# Patient Record
Sex: Female | Born: 1940 | Race: White | Hispanic: No | Smoking: Former smoker
Health system: Southern US, Community
[De-identification: ages and names within clinical notes are randomized; demographics above are authoritative.]

## PROBLEM LIST (undated history)

## (undated) ENCOUNTER — Emergency Department (HOSPITAL_COMMUNITY): Discharge status: Absent without leave | Payer: Medicare Other

## (undated) DIAGNOSIS — T8859XA Other complications of anesthesia, initial encounter: Secondary | ICD-10-CM

## (undated) DIAGNOSIS — I639 Cerebral infarction, unspecified: Secondary | ICD-10-CM

## (undated) DIAGNOSIS — M545 Low back pain, unspecified: Secondary | ICD-10-CM

## (undated) DIAGNOSIS — G971 Other reaction to spinal and lumbar puncture: Secondary | ICD-10-CM

## (undated) DIAGNOSIS — Z8709 Personal history of other diseases of the respiratory system: Secondary | ICD-10-CM

## (undated) DIAGNOSIS — R0602 Shortness of breath: Secondary | ICD-10-CM

## (undated) DIAGNOSIS — J189 Pneumonia, unspecified organism: Secondary | ICD-10-CM

## (undated) DIAGNOSIS — G8929 Other chronic pain: Secondary | ICD-10-CM

## (undated) DIAGNOSIS — R2681 Unsteadiness on feet: Secondary | ICD-10-CM

## (undated) DIAGNOSIS — D649 Anemia, unspecified: Secondary | ICD-10-CM

## (undated) DIAGNOSIS — Z87448 Personal history of other diseases of urinary system: Secondary | ICD-10-CM

## (undated) DIAGNOSIS — K219 Gastro-esophageal reflux disease without esophagitis: Secondary | ICD-10-CM

## (undated) DIAGNOSIS — G894 Chronic pain syndrome: Secondary | ICD-10-CM

## (undated) DIAGNOSIS — R3915 Urgency of urination: Secondary | ICD-10-CM

## (undated) DIAGNOSIS — M542 Cervicalgia: Secondary | ICD-10-CM

## (undated) DIAGNOSIS — F32A Depression, unspecified: Secondary | ICD-10-CM

## (undated) DIAGNOSIS — K5900 Constipation, unspecified: Secondary | ICD-10-CM

## (undated) DIAGNOSIS — T4145XA Adverse effect of unspecified anesthetic, initial encounter: Secondary | ICD-10-CM

## (undated) DIAGNOSIS — E039 Hypothyroidism, unspecified: Secondary | ICD-10-CM

## (undated) DIAGNOSIS — M1990 Unspecified osteoarthritis, unspecified site: Secondary | ICD-10-CM

## (undated) DIAGNOSIS — Z8619 Personal history of other infectious and parasitic diseases: Secondary | ICD-10-CM

## (undated) DIAGNOSIS — I10 Essential (primary) hypertension: Secondary | ICD-10-CM

## (undated) DIAGNOSIS — R011 Cardiac murmur, unspecified: Secondary | ICD-10-CM

## (undated) DIAGNOSIS — M2540 Effusion, unspecified joint: Secondary | ICD-10-CM

## (undated) DIAGNOSIS — M4690 Unspecified inflammatory spondylopathy, site unspecified: Secondary | ICD-10-CM

## (undated) DIAGNOSIS — Z87442 Personal history of urinary calculi: Secondary | ICD-10-CM

## (undated) HISTORY — PX: SHOULDER ARTHROSCOPY W/ ROTATOR CUFF REPAIR: SHX2400

## (undated) HISTORY — PX: BUNIONECTOMY: SHX129

## (undated) HISTORY — PX: COLONOSCOPY: SHX174

## (undated) HISTORY — PX: OTHER SURGICAL HISTORY: SHX169

## (undated) HISTORY — PX: ANTERIOR FUSION CERVICAL SPINE: SUR626

## (undated) HISTORY — PX: NASAL SINUS SURGERY: SHX719

## (undated) HISTORY — PX: THUMB ARTHROSCOPY: SHX2509

## (undated) HISTORY — PX: EYE SURGERY: SHX253

## (undated) HISTORY — PX: ABDOMINAL HYSTERECTOMY: SHX81

---

## 1989-11-29 DIAGNOSIS — G971 Other reaction to spinal and lumbar puncture: Secondary | ICD-10-CM

## 1989-11-29 HISTORY — DX: Other reaction to spinal and lumbar puncture: G97.1

## 1999-03-03 ENCOUNTER — Encounter: Payer: Self-pay | Admitting: Neurological Surgery

## 1999-03-05 ENCOUNTER — Inpatient Hospital Stay (HOSPITAL_COMMUNITY): Admission: RE | Admit: 1999-03-05 | Discharge: 1999-03-06 | Payer: Self-pay | Admitting: Neurological Surgery

## 1999-03-05 ENCOUNTER — Encounter: Payer: Self-pay | Admitting: Neurological Surgery

## 1999-08-06 ENCOUNTER — Ambulatory Visit (HOSPITAL_COMMUNITY): Admission: RE | Admit: 1999-08-06 | Discharge: 1999-08-06 | Payer: Self-pay | Admitting: Gastroenterology

## 1999-08-07 ENCOUNTER — Encounter: Payer: Self-pay | Admitting: Gastroenterology

## 1999-08-07 ENCOUNTER — Ambulatory Visit (HOSPITAL_COMMUNITY): Admission: RE | Admit: 1999-08-07 | Discharge: 1999-08-07 | Payer: Self-pay | Admitting: Gastroenterology

## 1999-08-11 ENCOUNTER — Ambulatory Visit (HOSPITAL_COMMUNITY): Admission: RE | Admit: 1999-08-11 | Discharge: 1999-08-11 | Payer: Self-pay | Admitting: Gastroenterology

## 1999-08-11 ENCOUNTER — Encounter: Payer: Self-pay | Admitting: Gastroenterology

## 2000-01-06 ENCOUNTER — Ambulatory Visit (HOSPITAL_COMMUNITY): Admission: RE | Admit: 2000-01-06 | Discharge: 2000-01-06 | Payer: Self-pay | Admitting: Gastroenterology

## 2000-01-06 ENCOUNTER — Encounter: Payer: Self-pay | Admitting: Gastroenterology

## 2000-01-08 ENCOUNTER — Encounter: Admission: RE | Admit: 2000-01-08 | Discharge: 2000-01-08 | Payer: Self-pay | Admitting: Rheumatology

## 2000-01-08 ENCOUNTER — Encounter: Payer: Self-pay | Admitting: Rheumatology

## 2000-03-01 ENCOUNTER — Other Ambulatory Visit: Admission: RE | Admit: 2000-03-01 | Discharge: 2000-03-01 | Payer: Self-pay | Admitting: Podiatry

## 2000-05-19 ENCOUNTER — Encounter (INDEPENDENT_AMBULATORY_CARE_PROVIDER_SITE_OTHER): Payer: Self-pay | Admitting: Specialist

## 2000-05-19 ENCOUNTER — Observation Stay (HOSPITAL_COMMUNITY): Admission: RE | Admit: 2000-05-19 | Discharge: 2000-05-20 | Payer: Self-pay | Admitting: Surgery

## 2001-03-06 ENCOUNTER — Encounter: Payer: Self-pay | Admitting: Specialist

## 2001-03-06 ENCOUNTER — Ambulatory Visit (HOSPITAL_COMMUNITY): Admission: RE | Admit: 2001-03-06 | Discharge: 2001-03-06 | Payer: Self-pay | Admitting: Specialist

## 2001-08-19 ENCOUNTER — Encounter
Admission: RE | Admit: 2001-08-19 | Discharge: 2001-08-19 | Payer: Self-pay | Admitting: Physical Medicine and Rehabilitation

## 2001-08-19 ENCOUNTER — Encounter: Payer: Self-pay | Admitting: Physical Medicine and Rehabilitation

## 2002-01-11 ENCOUNTER — Encounter: Admission: RE | Admit: 2002-01-11 | Discharge: 2002-01-11 | Payer: Self-pay | Admitting: Internal Medicine

## 2002-01-11 ENCOUNTER — Encounter: Payer: Self-pay | Admitting: Internal Medicine

## 2002-08-14 ENCOUNTER — Other Ambulatory Visit: Admission: RE | Admit: 2002-08-14 | Discharge: 2002-08-14 | Payer: Self-pay | Admitting: Obstetrics & Gynecology

## 2003-08-28 ENCOUNTER — Encounter: Admission: RE | Admit: 2003-08-28 | Discharge: 2003-08-28 | Payer: Self-pay | Admitting: Internal Medicine

## 2003-08-28 ENCOUNTER — Encounter: Payer: Self-pay | Admitting: Internal Medicine

## 2004-03-17 ENCOUNTER — Encounter: Admission: RE | Admit: 2004-03-17 | Discharge: 2004-03-17 | Payer: Self-pay | Admitting: Internal Medicine

## 2004-12-22 ENCOUNTER — Ambulatory Visit (HOSPITAL_BASED_OUTPATIENT_CLINIC_OR_DEPARTMENT_OTHER): Admission: RE | Admit: 2004-12-22 | Discharge: 2004-12-22 | Payer: Self-pay | Admitting: Orthopedic Surgery

## 2004-12-22 ENCOUNTER — Ambulatory Visit (HOSPITAL_COMMUNITY): Admission: RE | Admit: 2004-12-22 | Discharge: 2004-12-22 | Payer: Self-pay | Admitting: Orthopedic Surgery

## 2006-09-30 ENCOUNTER — Encounter: Admission: RE | Admit: 2006-09-30 | Discharge: 2006-09-30 | Payer: Self-pay | Admitting: Orthopedic Surgery

## 2006-11-28 ENCOUNTER — Ambulatory Visit (HOSPITAL_BASED_OUTPATIENT_CLINIC_OR_DEPARTMENT_OTHER): Admission: RE | Admit: 2006-11-28 | Discharge: 2006-11-28 | Payer: Self-pay | Admitting: Orthopedic Surgery

## 2007-09-14 ENCOUNTER — Ambulatory Visit (HOSPITAL_COMMUNITY): Admission: RE | Admit: 2007-09-14 | Discharge: 2007-09-14 | Payer: Self-pay | Admitting: Internal Medicine

## 2008-06-26 ENCOUNTER — Ambulatory Visit (HOSPITAL_COMMUNITY): Admission: RE | Admit: 2008-06-26 | Discharge: 2008-06-26 | Payer: Self-pay | Admitting: Internal Medicine

## 2008-08-13 ENCOUNTER — Ambulatory Visit (HOSPITAL_COMMUNITY): Admission: RE | Admit: 2008-08-13 | Discharge: 2008-08-13 | Payer: Self-pay | Admitting: Anesthesiology

## 2008-10-02 ENCOUNTER — Ambulatory Visit (HOSPITAL_COMMUNITY): Admission: RE | Admit: 2008-10-02 | Discharge: 2008-10-02 | Payer: Self-pay | Admitting: Internal Medicine

## 2008-10-14 ENCOUNTER — Ambulatory Visit: Payer: Self-pay

## 2008-11-04 ENCOUNTER — Ambulatory Visit (HOSPITAL_COMMUNITY): Admission: RE | Admit: 2008-11-04 | Discharge: 2008-11-05 | Payer: Self-pay | Admitting: Neurological Surgery

## 2009-09-11 ENCOUNTER — Ambulatory Visit (HOSPITAL_COMMUNITY): Admission: RE | Admit: 2009-09-11 | Discharge: 2009-09-11 | Payer: Self-pay | Admitting: Ophthalmology

## 2009-12-25 ENCOUNTER — Emergency Department (HOSPITAL_COMMUNITY): Admission: EM | Admit: 2009-12-25 | Discharge: 2009-12-26 | Payer: Self-pay | Admitting: Emergency Medicine

## 2009-12-26 ENCOUNTER — Ambulatory Visit (HOSPITAL_COMMUNITY): Admission: RE | Admit: 2009-12-26 | Discharge: 2009-12-26 | Payer: Self-pay | Admitting: Urology

## 2009-12-30 DEATH — deceased

## 2010-09-01 ENCOUNTER — Ambulatory Visit: Payer: Self-pay | Admitting: Internal Medicine

## 2010-09-09 LAB — COMPREHENSIVE METABOLIC PANEL
ALT: 27 U/L (ref 0–35)
Albumin: 3.8 g/dL (ref 3.5–5.2)
Alkaline Phosphatase: 94 U/L (ref 39–117)
CO2: 26 mEq/L (ref 19–32)
Creatinine, Ser: 0.73 mg/dL (ref 0.40–1.20)
Potassium: 4.6 mEq/L (ref 3.5–5.3)
Sodium: 139 mEq/L (ref 135–145)
Total Bilirubin: 0.4 mg/dL (ref 0.3–1.2)
Total Protein: 5.8 g/dL — ABNORMAL LOW (ref 6.0–8.3)

## 2010-09-09 LAB — CBC & DIFF AND RETIC
LYMPH%: 24.1 % (ref 14.0–49.7)
MCV: 79.8 fL (ref 79.5–101.0)
MONO#: 0.5 10*3/uL (ref 0.1–0.9)
NEUT%: 54.2 % (ref 38.4–76.8)
RDW: 14.8 % — ABNORMAL HIGH (ref 11.2–14.5)
WBC: 6.6 10*3/uL (ref 3.9–10.3)

## 2010-09-09 LAB — FOLATE: Folate: >20 ng/mL

## 2010-09-09 LAB — LACTATE DEHYDROGENASE: LDH: 185 U/L (ref 94–250)

## 2010-09-09 LAB — FERRITIN: Ferritin: 6 ng/mL — ABNORMAL LOW (ref 10–291)

## 2010-09-09 LAB — IRON AND TIBC
Iron: 35 ug/dL — ABNORMAL LOW (ref 42–145)
UIBC: 365 ug/dL

## 2010-09-11 LAB — PROTEIN ELECTROPHORESIS, SERUM
Albumin ELP: 60.1 % (ref 55.8–66.1)
Alpha-1-Globulin: 11 % — ABNORMAL HIGH (ref 2.9–4.9)
Beta 2: 2.2 % — ABNORMAL LOW (ref 3.2–6.5)

## 2010-09-14 ENCOUNTER — Ambulatory Visit (HOSPITAL_COMMUNITY): Admission: RE | Admit: 2010-09-14 | Discharge: 2010-09-14 | Payer: Self-pay | Admitting: Internal Medicine

## 2010-09-24 LAB — CBC WITH DIFFERENTIAL/PLATELET
Basophils Absolute: 0.1 10*3/uL (ref 0.0–0.1)
Eosinophils Absolute: 1.3 10*3/uL — ABNORMAL HIGH (ref 0.0–0.5)
HCT: 36.8 % (ref 34.8–46.6)
HGB: 11.5 g/dL — ABNORMAL LOW (ref 11.6–15.9)
MCH: 25.5 pg (ref 25.1–34.0)
MCHC: 31.3 g/dL — ABNORMAL LOW (ref 31.5–36.0)
lymph#: 2.2 10*3/uL (ref 0.9–3.3)

## 2010-10-01 ENCOUNTER — Ambulatory Visit: Payer: Self-pay | Admitting: Internal Medicine

## 2010-10-29 ENCOUNTER — Ambulatory Visit (HOSPITAL_COMMUNITY)
Admission: RE | Admit: 2010-10-29 | Discharge: 2010-10-29 | Payer: Self-pay | Source: Home / Self Care | Admitting: Gastroenterology

## 2010-11-20 ENCOUNTER — Ambulatory Visit: Payer: Self-pay | Admitting: Internal Medicine

## 2010-11-25 LAB — IRON AND TIBC
Iron: 98 ug/dL (ref 42–145)
TIBC: 257 ug/dL (ref 250–470)
UIBC: 159 ug/dL

## 2010-11-25 LAB — CBC WITH DIFFERENTIAL/PLATELET
BASO%: 1.2 % (ref 0.0–2.0)
Basophils Absolute: 0.1 10*3/uL (ref 0.0–0.1)
EOS%: 19.5 % — ABNORMAL HIGH (ref 0.0–7.0)
HGB: 12.4 g/dL (ref 11.6–15.9)
LYMPH%: 31 % (ref 14.0–49.7)
MCHC: 33.5 g/dL (ref 31.5–36.0)
MCV: 85.3 fL (ref 79.5–101.0)
MONO%: 8.9 % (ref 0.0–14.0)
NEUT#: 2 10*3/uL (ref 1.5–6.5)
RBC: 4.33 10*6/uL (ref 3.70–5.45)
RDW: 18.8 % — ABNORMAL HIGH (ref 11.2–14.5)

## 2010-11-25 LAB — FERRITIN: Ferritin: 183 ng/mL (ref 10–291)

## 2010-11-26 ENCOUNTER — Encounter
Admission: RE | Admit: 2010-11-26 | Discharge: 2010-11-26 | Payer: Self-pay | Source: Home / Self Care | Attending: Internal Medicine | Admitting: Internal Medicine

## 2010-11-29 HISTORY — PX: CYSTOSCOPY W/ URETEROSCOPY: SUR379

## 2010-11-29 HISTORY — PX: LAPAROSCOPIC CHOLECYSTECTOMY W/ CHOLANGIOGRAPHY: SUR757

## 2010-11-29 HISTORY — PX: JOINT REPLACEMENT: SHX530

## 2010-12-07 ENCOUNTER — Ambulatory Visit (HOSPITAL_COMMUNITY)
Admission: RE | Admit: 2010-12-07 | Discharge: 2010-12-07 | Payer: Self-pay | Source: Home / Self Care | Attending: Gastroenterology | Admitting: Gastroenterology

## 2010-12-25 ENCOUNTER — Encounter
Admission: RE | Admit: 2010-12-25 | Discharge: 2010-12-25 | Payer: Self-pay | Source: Home / Self Care | Attending: Gastroenterology | Admitting: Gastroenterology

## 2011-02-14 LAB — URINE MICROSCOPIC-ADD ON

## 2011-02-14 LAB — CBC
HCT: 37 % (ref 36.0–46.0)
MCHC: 34.1 g/dL (ref 30.0–36.0)
MCV: 88 fL (ref 78.0–100.0)
Platelets: 263 10*3/uL (ref 150–400)
RBC: 4.21 MIL/uL (ref 3.87–5.11)
RDW: 14 % (ref 11.5–15.5)

## 2011-02-14 LAB — BASIC METABOLIC PANEL
GFR calc Af Amer: >60 mL/min (ref >60)
Glucose, Bld: 127 mg/dL — ABNORMAL HIGH (ref 70–99)
Potassium: 3.7 mEq/L (ref 3.5–5.1)

## 2011-02-14 LAB — URINALYSIS, ROUTINE W REFLEX MICROSCOPIC
Protein, ur: NEGATIVE mg/dL
Specific Gravity, Urine: 1.01 (ref 1.005–1.030)

## 2011-02-14 LAB — DIFFERENTIAL
Eosinophils Relative: 5 % (ref 0–5)
Lymphocytes Relative: 13 % (ref 12–46)
Monocytes Absolute: 0.8 10*3/uL (ref 0.1–1.0)
Monocytes Relative: 7 % (ref 3–12)

## 2011-03-04 LAB — CBC
Hemoglobin: 12.7 g/dL (ref 12.0–15.0)
Platelets: 253 10*3/uL (ref 150–400)
RDW: 14.8 % (ref 11.5–15.5)

## 2011-04-13 NOTE — Op Note (Signed)
NAME:  Isabel Barnes, Isabel Barnes             ACCOUNT NO.:  [id number]   MEDICAL RECORD NO.:  [id number]          PATIENT TYPE:  INP   LOCATION:  3524                         FACILITY:  [facility]   PHYSICIAN:  Stefani Dama, M.D.  DATE OF BIRTH:  06/24/1941   DATE OF PROCEDURE:  11/04/2008  DATE OF DISCHARGE:                               OPERATIVE REPORT   PREOPERATIVE DIAGNOSIS:  Spondylosis C6-C7 with cervical radiculopathy  bilaterally.   POSTOPERATIVE DIAGNOSIS:  Spondylosis C6-C7 with cervical radiculopathy  bilaterally.   PROCEDURE:  Anterior cervical decompression C6-C7 arthrodesis with  structural allograft Alphatec plate fixation.   SURGEON:  Stefani Dama, MD   FIRST ASSISTANT:  Hewitt Shorts, MD   ANESTHESIA:  General endotracheal.   INDICATIONS:  Isabel Barnes is a 70 year old individual who has had  significant problems with neck, shoulder and arm pain bilaterally.  She  has had previous rotator cuff repair but she has evidence of  radiculopathic pain in the C7 distribution bilaterally.  She has had a  previous anterior decompression arthrodesis at C3-C4 secondary to cord  compression at that level and now has advanced spondylitic changes at C6-  C7.   PROCEDURE:  The patient was brought to the operating room and placed on  the table in supine position.  After the smooth induction of general  endotracheal anesthesia, neck was braced in a 5-pound halter traction,  prepped with alcohol and Chlorascrub and then draped sterilely.  Transverse incision was made in the left side of the neck and dissection  was carried down through the platysma.  The plane between the  sternocleidomastoid and the strap muscles were dissected bluntly until  the prevertebral space was reached.  The first identifiable disk space  was noted to be that of C5-C6 on localizing radiograph.  The patient  then had the anterior dissection carried down to C6-C7 and self-  retaining Caspar retractor  was placed under longus colli muscle.  The  disk space was opened with #15 blade and a combination of Kerrison  rongeurs was used to evacuate a significant quantity of severely  degenerated and desiccated disk material from the space.  As the region  of the posterior longitudinal ligament was reached, there was noted to  be a substantial osteophyte mostly from the inferior body of the C6 up  laterally into the uncinate processes and this was drilled down with a  2.3 mm dissecting tool and then a 1 mm and then a 2 mm Kerrison punch  could be slipped under this to decompress both the central portion of  canal and lateral recesses.  With the uncinate processes being drilled  down and the lateral recesses being well-decompressed, the bony  endplates were then shaved with a 5-mm barrel bit to align them  parallel.  Then a 7-mm transgraft was shaved to the appropriate size and  configuration to fit within the interspace.  This was filled with  demineralized bone matrix then placed into the interspace and  countersunk appropriately.  At this point, traction was removed and then  a 14-mm standard size Trestle plate was  fixed to the ventral aspect of  the vertebral bodies with four variable angle 14-mm screws.  Final  radiograph was obtained identifying good position of the surgical  construct.  Hemostasis in the soft tissues was then meticulously  obtained, particularly from the region of the longus colli muscle and  self-retaining retractor was removed.  Hemostasis in the soft tissues was checked and then the platysma was  closed with 3-0 Vicryl in interrupted fashion, 3-0 Vicryl was used in  the subcuticular tissues and Dermabond was placed on the skin.  The  patient tolerated the procedure well.      Stefani Dama, M.D.  Electronically Signed     HJE/MEDQ  D:  11/04/2008  T:  11/05/2008  Job:  [id number]

## 2011-04-16 NOTE — Op Note (Signed)
NAME:  Isabel Barnes, Isabel Barnes             ACCOUNT NO.:  [id number]   MEDICAL RECORD NO.:  [id number]          PATIENT TYPE:  AMB   LOCATION:  DSC                          FACILITY:  [facility]   PHYSICIAN:  Dionne Ano. 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   PROCEDURES:  1.  Removal of cystic mass left first dorsal compartment, consistent with a      ganglion.  2.  Arthroplasty left CMC joint, with trapezium excision.  3.  Tendon transfer in the form of a one-third abductor pollicis longus      proper portion tendon transfer to the FCR, APL proper, and back upon      itself (Weilby tendon transfer/suspension) left basilar thumb joint.  4.  Abductor pollicis longus digastric portion tendon transfer to the first      metacarpal, FCR, and back upon itself and the APL proper (Zancolli      tendon transfer/suspension) left basilar thumb joint.  5.  Abductor pollicis longus tenodesis (shortening of a wrist extensor at      the wrist/basilar thumb joint level).   SURGEON:  Dionne Ano. Amanda Pea, M.D.   ASSISTANT:  Karie Chimera, P.A.-C.   COMPLICATIONS:  None.   ANESTHESIA:  Preoperative regional block, with supplemental LMA general  anesthetic.   TOURNIQUET TIME:  59 minutes.   ESTIMATED BLOOD LOSS:  Minimal.   INDICATIONS FOR THE PROCEDURE:  This patient is a very pleasant female who  presents with the above-mentioned diagnosis.  After counseling regarding the  risks and benefits of surgery, including the risk of infection, bleeding,  anesthesia, damage to normal structures, and  failure of the surgery to  accomplish its intended goals of relieving symptoms and restoring function,  With this in mind, she desires to proceed.  All questions have been  encouraged and answered preoperatively.   OPERATIVE FINDINGS:  This patient had a cystic mass about the first dorsal  compartment.  It was removed, along with decompression of the compartment.  She also underwent double tendon transfer and arthroplasty of the [facility] joint  without complicating features.  All sponge, needle, and instrument counts  were reported as correct.  There were no complicating features with her  surgery.  She was excellently suspended, and there were no complicated  features with her surgery.   OPERATION IN DETAIL:  The patient was seen by myself and anesthesia and was  taken to the operative suite.  Axillary block was administered by Dr.  Jacklynn Bue in the preoperative holding area.  She was given vancomycin 500 mg  IV.  Following this, she was laid supine and appropriately padded, prepped  and draped in the usual sterile fashion about the left upper extremity after  general LMA anesthetic  was induced.  The preoperative block of course was  for postop pain analgesia.  Following this, the patient then underwent an  incision dorsally about the basilar thumb joint.  Dissection was carried  down.  The EPB and APL were identified.  An incision was made along the  interval between the two.  The capsule was incised.  Freer elevator was then  placed on either side of the trapezium, and the trapezium was excised  piecemeal.  I identified the radial artery and the superficial radial nerve  branches to make sure that they were protected throughout the course of the  operation.  They were meticulously protected.  The trapezium was removed.  The FCR underwent a tenolysis and tenosynovectomy in the depths of the  wound, and following this a drill hole was made dorsal to palmar, extending  intra-articularly in  line with the palmar beak ligament and enlarged to a  3.2 drill bit under guide wire protection and placement.  The joint was then  cleaned out, and this completed the arthroplasty portion of the procedure.   Once this was done, the patient had a counterincision made proximally, and  the abductor pollicis longus digastric portion and the abductor pollicis  longus one-third proper portion were harvested.  They were released  proximally and allowed to keep their secure anchor distally.  Following  this, I then removed the cystic mass off of the first dorsal compartment  consistent with a ganglion.  This was a ganglion off of the first dorsal  compartment, and a partial release of the first dorsal compartment was  accomplished as was decompression via the tendon release.  Once this was  done, the abductor pollicis longus digastric portion was transferred  adjacent to the metacarpal, through the drill hole, dorsal to palmar,  extending intra-articularly in line with the palmar beak ligament through  the previously drilled hole.  I then placed it around the FCR twice and  through a slit in it and back up to the APL proper.  It was inset with fiber  wire suture of the 4-0 variety under excellent tension.  This completed the  Zancolli tendon transfer.  Following this, I performed tendon transfer of  the APL one-third proper portion to the APL proper and back upon the FCR and  itself with multiple figure-of-eight throws around these structures,  insetting it/securing it with 4-0 fiber wire.  This completed the [facility]  tendon transfer.  Following this, I performed an abductor pollicis longus  tenodesis to prevent dorsolateral escape.  This was done by shortening the  abductor pollicis longus with fiber wire suture.  This was imbricated with  the capsule as well during the closure.  Thus, the shortening of her wrist  extensor was accomplished also.  Following this, the patient then  underwent irrigation, tourniquet deflation at 59 minutes.  Excellent hemostasis was  noted, and all looked quite well.  I was pleased with this and the findings.  Following this, the patient then underwent dressing after closure and thumb  spica splint.  She tolerated this well.  There were no complicating  features.  She will be admitted for IV antibiotics, pain medicine according  to her needs, and will be monitored closely.  It has been a pleasure to  participate in her care.  I look forward to participating in her postop  recovery.      WMG/MEDQ  D:  12/22/2004  T:  12/22/2004  Job:  [id number]  cc:   Dionne Ano. Everlene Other, M.D.  [address]  [place]  [place] [address]  Fax: [phone]

## 2011-04-16 NOTE — Op Note (Signed)
NAME:  Isabel Barnes, Isabel Barnes             ACCOUNT NO.:  [id number]   MEDICAL RECORD NO.:  [id number]          PATIENT TYPE:  AMB   LOCATION:  DSC                          FACILITY:  [facility]   PHYSICIAN:  Robert A. Thurston Hole, M.D. DATE OF BIRTH:  08/13/1941   DATE OF PROCEDURE:  11/28/2006  DATE OF DISCHARGE:                               OPERATIVE REPORT   PREOPERATIVE DIAGNOSES:  1. Right shoulder partial rotator cuff tear partial labrum tear.  2. Right shoulder chondromalacia.  3. Right shoulder impingement.  4. 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   POSTOPERATIVE DIAGNOSES:  1. Right shoulder partial rotator cuff tear partial labrum tear.  2. Right shoulder chondromalacia.  3. Right shoulder impingement.  4. 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   PROCEDURE:  1. Right shoulder EUA followed by arthroscopic debridement partial      rotator cuff tear, partial labrum tear.  2. Right shoulder chondroplasty.  3. Right shoulder subacromial decompression.  4. Right shoulder distal clavicle excision.   SURGEON:  Elana Alm. Thurston Hole, M.D.   ASSISTANT:  Julien Girt, P.A.   ANESTHESIA:  General.   OPERATIVE TIME:  45 minutes.   COMPLICATIONS:  None.   INDICATIONS FOR PROCEDURE:  Isabel Barnes is a 65-year woman who has had 2  years of increasing right shoulder pain with exam and MRI documenting a  partial versus complete rotator cuff tear.  She had a rotator cuff  repair 15 years ago.  Also impingement and AC joint arthropathy.  She  has failed conservative care is now to undergo arthroscopy.   DESCRIPTION:  Isabel Barnes is brought to operating room on 11/28/2006  after interscalene block was placed in holding room by anesthesia.  She  was placed operative table supine position.  After being placed under  general anesthesia her right shoulder was examined.  She had full range  of motion.   Her shoulder was stable to ligamentous exam.  She was then  placed in beach chair position.  Her shoulder and arm was prepped with  Hibiclens and draped using sterile technique.  Originally through a  posterior arthroscopic portal, the arthroscope with a pump attached was  placed and through an anterior portal, an arthroscopic probe was placed.  On initial inspection, the articular cartilage in the glenohumeral joint  showed 25 to 30% grade 3 chondromalacia which was debrided.  She had  part partial tearing of the anterior, superior and posterior labrum 25  to 30% which was debrided.  Biceps tendon anchor was intact.  Biceps  tendon showed partial tearing 30% which was debrided with partial medial  subluxation of the biceps.  She had partial tearing 50 to 60% of the  undersurface of the supraspinatus, infraspinatus and subscap and this  was partially debrided but it was still well attached and her  previous  sutures from her previous repair were still intact.  The inferior  capsular recess was free of pathology.  Subacromial space was entered.  A lateral arthroscopic portal was made.  Moderate bursitis was were  debrided.  The rotator cuff on the bursal surface was frayed and  partially torn 30 to 40% and this was carefully debrided but was still  intact.  Impingement was noted and very careful subacromial  decompression was carried out removing 4 to 5 mm of the undersurface of  the anterior, anterolateral, anteromedial acromion.  [facility] joint showed  significant spurring and degenerative changes and the distal 5 mm of  clavicle was resected with a 6-mm bur.  After this was done, the  shoulder could be brought through full range of motion with no  impingement on the rotator cuff.  At this point it was felt that all  pathology had been satisfactorily addressed.  The instruments were  removed.  Portals closed with 3-0 nylon suture.  Sterile dressings and a  sling applied.  The patient  awakened and taken to recovery in stable  condition.   FOLLOW-UP CARE:  Isabel Barnes will be followed as outpatient on Percocet  7.5 with early physical therapy.  See her back in the office in a week  for sutures out follow-up.      Robert A. Thurston Hole, M.D.  Electronically Signed     RAW/MEDQ  D:  11/28/2006  T:  11/28/2006  Job:  [id number]

## 2011-04-16 NOTE — Op Note (Signed)
[facility]  Patient:    Isabel Barnes, Isabel Barnes                      MRN: [id number] Proc. Date: 05/19/00 Adm. Date:  [id number] Disc. Date: [id number] Attending:  Abigail Miyamoto A                           Operative Report  PREOPERATIVE DIAGNOSIS:  Biliary dyskinesia.  POSTOPERATIVE DIAGNOSIS:  Biliary dyskinesia.  PROCEDURE:  Laparoscopic cholecystectomy.  SURGEON:  Abigail Miyamoto, M.D.  ASSISTANT:  Gita Kudo, M.D.  ANESTHESIA:  General endotracheal anesthesia and 0.25% Marcaine plain.  DESCRIPTION OF PROCEDURE:  The patient was brought to the operating room, identified as Nunzio Cory. She was placed supine on the operating room table and general anesthesia was induced. Her abdomen was then prepped and draped in the usual sterile fashion. Using a #15 blade, a small transverse incision was made just below the umbilicus through a previous incision. The incision was carried down through the fascia. The fascia was then opened with a scalpel. A hemostat was then used to pass into the peritoneal cavity. Next a #0 Vicryl pursestring suture was placed around the opening. The Hasson port was then placed into the opening and insufflation of the abdomen was begun. Next, an 11 mm port was placed in the patients epigastrium and two 5 mm ports were placed in the patients right flank all under direct vision. The gallbladder was then identified and retracted above the liver bed. Dissection was then carried out at the base of the gallbladder. The cystic duct and artery were easily dissected out. The duct was clipped twice proximally, once distally and transected with the scissors. The artery was likewise clipped twice proximally, once distally and transected as well. Another branch was identified and clipped as well. The gallbladder was then slowly dissected free from the liver bed with the electrocautery. Hemostasis in the liver bed appeared to be achieved.  The gallbladder was then completely removed from the liver bed and then removed through the port at the umbilicus. Next, the abdomen was copiously irrigated with normal saline. All ports were then removed under direct vision. The abdomen was then deflated. The #0 Vicryl of the umbilicus was then fastened in place. All skin incisions were then anesthetized with 0.25% Marcaine and then closed with 4-0 monocryl subcuticular sutures. Steri-Strips, gauze and tape were then applied. The patient tolerated the procedure well. All sponge, needle and instrument counts were correct at the end of the procedure. The patient was then extubated in the operating room and taken in stable condition to the recovery room. DD:  05/19/00 TD:  05/20/00 Job: [id number]

## 2011-07-14 ENCOUNTER — Other Ambulatory Visit: Payer: Self-pay | Admitting: Orthopedic Surgery

## 2011-07-14 DIAGNOSIS — M129 Arthropathy, unspecified: Secondary | ICD-10-CM

## 2011-07-16 ENCOUNTER — Ambulatory Visit
Admission: RE | Admit: 2011-07-16 | Discharge: 2011-07-16 | Disposition: A | Discharge status: Home / Self Care | Payer: Medicare Other | Source: Ambulatory Visit | Attending: Orthopedic Surgery | Admitting: Orthopedic Surgery

## 2011-07-16 DIAGNOSIS — M129 Arthropathy, unspecified: Secondary | ICD-10-CM

## 2011-07-21 ENCOUNTER — Encounter (HOSPITAL_COMMUNITY)
Admission: RE | Admit: 2011-07-21 | Discharge: 2011-07-21 | Disposition: A | Discharge status: Home / Self Care | Payer: Managed Care, Other (non HMO) | Source: Ambulatory Visit | Attending: Orthopedic Surgery | Admitting: Orthopedic Surgery

## 2011-07-21 LAB — BASIC METABOLIC PANEL
BUN: 14 mg/dL (ref 6–23)
Chloride: 107 mEq/L (ref 96–112)
GFR calc Af Amer: >60 mL/min (ref >60)
Glucose, Bld: 92 mg/dL (ref 70–99)
Potassium: 4.8 mEq/L (ref 3.5–5.1)

## 2011-07-21 LAB — CBC
HCT: 37.1 % (ref 36.0–46.0)
Hemoglobin: 12.4 g/dL (ref 12.0–15.0)
RBC: 4.33 MIL/uL (ref 3.87–5.11)
WBC: 7.4 10*3/uL (ref 4.0–10.5)

## 2011-07-27 ENCOUNTER — Inpatient Hospital Stay (HOSPITAL_COMMUNITY): Payer: Managed Care, Other (non HMO)

## 2011-07-27 ENCOUNTER — Inpatient Hospital Stay (HOSPITAL_COMMUNITY)
Admission: RE | Admit: 2011-07-27 | Discharge: 2011-07-29 | DRG: 484 | Disposition: A | Discharge status: Home / Self Care | Payer: Managed Care, Other (non HMO) | Source: Ambulatory Visit | Attending: Orthopedic Surgery | Admitting: Orthopedic Surgery

## 2011-07-27 DIAGNOSIS — Z87891 Personal history of nicotine dependence: Secondary | ICD-10-CM

## 2011-07-27 DIAGNOSIS — M719 Bursopathy, unspecified: Principal | ICD-10-CM | POA: Diagnosis present

## 2011-07-27 DIAGNOSIS — M19019 Primary osteoarthritis, unspecified shoulder: Secondary | ICD-10-CM | POA: Diagnosis present

## 2011-07-27 DIAGNOSIS — Z79899 Other long term (current) drug therapy: Secondary | ICD-10-CM

## 2011-07-27 DIAGNOSIS — Z7982 Long term (current) use of aspirin: Secondary | ICD-10-CM

## 2011-07-27 DIAGNOSIS — F411 Generalized anxiety disorder: Secondary | ICD-10-CM | POA: Diagnosis present

## 2011-07-27 DIAGNOSIS — M67919 Unspecified disorder of synovium and tendon, unspecified shoulder: Principal | ICD-10-CM | POA: Diagnosis present

## 2011-07-28 LAB — BASIC METABOLIC PANEL
CO2: 24 mEq/L (ref 19–32)
Calcium: 8.1 mg/dL — ABNORMAL LOW (ref 8.4–10.5)
Creatinine, Ser: <0.47 mg/dL — ABNORMAL LOW (ref 0.50–1.10)
Glucose, Bld: 95 mg/dL (ref 70–99)
Sodium: 141 mEq/L (ref 135–145)

## 2011-07-28 LAB — CBC
Hemoglobin: 9.8 g/dL — ABNORMAL LOW (ref 12.0–15.0)
MCH: 29 pg (ref 26.0–34.0)
MCV: 86.7 fL (ref 78.0–100.0)
RBC: 3.38 MIL/uL — ABNORMAL LOW (ref 3.87–5.11)

## 2011-07-29 LAB — BASIC METABOLIC PANEL
BUN: 13 mg/dL (ref 6–23)
CO2: 27 mEq/L (ref 19–32)
Calcium: 8.5 mg/dL (ref 8.4–10.5)
Creatinine, Ser: 0.65 mg/dL (ref 0.50–1.10)

## 2011-07-29 LAB — CBC
HCT: 27.1 % — ABNORMAL LOW (ref 36.0–46.0)
MCH: 29.3 pg (ref 26.0–34.0)
MCV: 86.3 fL (ref 78.0–100.0)
Platelets: 228 10*3/uL (ref 150–400)
RBC: 3.14 MIL/uL — ABNORMAL LOW (ref 3.87–5.11)

## 2011-07-30 NOTE — Discharge Summary (Signed)
  NAME:  Isabel Barnes, SYRING NO.:  [id number]  MEDICAL RECORD NO.:  [id number]  LOCATION:  5005                         FACILITY:  [facility]  PHYSICIAN:  Jones Broom, MD    DATE OF BIRTH:  [birth year]  DATE OF ADMISSION:  07/27/2011 DATE OF DISCHARGE:  07/29/2011                              DISCHARGE SUMMARY   FINAL DIAGNOSES: 1. 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 2. History of asthma. 3. History of anemia.  PRINCIPAL PROCEDURE:  Reverse total shoulder arthroplasty, July 27, 2011.  HOSPITAL COURSE:  [name] was admitted on July 27, 2011, after undergoing right reverse total shoulder arthroplasty without complication.  She recovered well on the floor postoperatively.  Her pain was initially controlled with Dilaudid PCA and subsequently transitioned to oral Dilaudid and oral hydrocodone that she was taking preoperatively.  Her pain was ultimately very well controlled.  She has multiple allergies and sensitivities and did have some sensitivity to the tape that was used during surgery, but her rash quickly cleared after removal of the tape.  Her incision was clean, dry, and intact. The drain was discontinued postoperative day #1.  She had a Foley catheter placed during surgery, which was discontinued postoperative day #1 and she voided spontaneously without difficulty.  On postoperative day #2, she was stable with stable vital signs.  Hemoglobin had drifted down to 9.12 due to acute postoperative blood loss, but she was asymptomatic with stable vital signs.  She was discharged home in stable condition with her husband.  She worked with occupational therapy who recommended a shower chair, which was prescribed for her.  DISCHARGE INSTRUCTIONS:  She will be discharged with the standard instructions for reverse shoulder arthroplasty.  She will work on hand, wrist, elbow motion, and otherwise will  stay on her sling.  She will follow up with me in 10-14 days for wound check.  She will call or return sooner with any problems or concerns.  Her postoperative pain management prescriptions have been already given to her by her pain management doctor include Dilaudid orally and hydrocodone.     Jones Broom, MD     JC/MEDQ  D:  07/29/2011  T:  07/29/2011  Job:  [id number]  Electronically Signed by Jones Broom  on 07/30/2011 09:40:24 AM

## 2011-07-30 NOTE — Op Note (Signed)
[name], Isabel NO.:  [id number]  MEDICAL RECORD NO.:  [id number]  LOCATION:  2899                         FACILITY:  [facility]  PHYSICIAN:  Jones Broom, MD    DATE OF BIRTH:  10/30/1941  DATE OF PROCEDURE:  07/27/2011 DATE OF DISCHARGE:                              OPERATIVE REPORT   PREOPERATIVE DIAGNOSIS:  Right shoulder rotator cuff tear arthropathy.  POSTOPERATIVE DIAGNOSIS:  Right shoulder rotator cuff tear arthropathy.  PROCEDURE PERFORMED:  Right shoulder reverse total shoulder arthroplasty.  ATTENDING SURGEON:  Jones Broom, MD  ASSISTANT:  Laural Benes. Su Hilt, PA-C  ANESTHESIA:  GET with preoperative interscalene block.  COMPLICATIONS:  None.  DRAINS:  One medium Hemovac.  SPECIMENS:  None.  ESTIMATED BLOOD LOSS:  250 mL.  INDICATION FOR SURGERY:  The patient is a 70 year old female with a long history of right shoulder problems.  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 She has had extensive operative and nonoperative management and has not done well with that.  She wished to go ahead with arthroplasty to alleviate her pain and try to increase her function.  She understood risks, benefits, and alternatives of the procedure including, but not limited to risk of bleeding, infection, damage to neurovascular structures, risk of dislocation, fracture, and implant failure.  She understands all this and wishes to go forward with surgery.  OPERATIVE FINDINGS:  A DePuy cemented size-10 stem with a 3-mm poly and a 42 glenosphere was placed with excellent stability intraoperatively. I was able to repair the subscapularis anteriorly.  PROCEDURE:  The patient was identified in the preoperative holding area where I personally marked the operative site after verifying site, side, and procedure with the patient.  She  had an interscalene block given by the attending anesthesiologist which was felt to be successful.  She was taken back to the operating room where she was transferred to the operative table and general anesthesia was induced without complication. She was placed in the beach-chair position with all extremities carefully padded and positioned, head and neck carefully positioned. She did receive IV vancomycin preoperatively.  The right upper extremity was prepped and draped in a standard sterile fashion.  An appropriate time-out procedure was carried out.  An approximately 10-cm incision was made from the palpable tip of the coracoid down the center of one of the humerus at the level of the axilla.  Dissection was carried down to the cephalic vein which was taken laterally with the deltoid.  The deltopectoral interval was developed and the underlying conjoined tendon was identified.  The retractor was then placed beneath the conjoined tendon medially and the deltoid laterally.  I palpated the axillary nerve and a nerve was protected throughout the procedure.  The musculocutaneous nerve was not palpated in the operative field.  The anterior circumflex, humeral vessels were then clamped and coagulated and the biceps tendon was then identified and traced into the joint where it was taken off the supraglenoid tubercle.  The remaining biceps was tensioned and tenodesed to the upper border of the pectoralis major and the remaining  portion of the biceps was discarded.  The subscapularis tendon which was largely intact was then taken down off the lesser tuberosity and a capsular release was performed to the 6 o'clock position.  The head was then dislocated, and rongeur was used to remove excess osteophytes.  The intramedullary reamers were then used from 6-10 mm which was felt to be the appropriate-size implant.  The 10- mm guide was then placed and the cutting guide was used to perform a standard  head cut in 10 degrees retroversion.  The head protector guide was then placed in the glenoid was exposed with abduction, extension, and slight external rotation.  A Fakuda retractor was placed behind the glenoid and the entire labrum was then removed circumferentially and the capsule was released circumferentially as well to assist with glenoid exposure.  The axillary nerve was carefully protected.  Once the exposure was obtained, the guide was placed and the pin was then advanced to the center of the glenoid slightly inferior.  The reamer was then used to ream the glenoid as well as the hand reamer for the superior surface.  A nice concentric surface was obtained.  The central drill was then used which remained in the glenoid vault and the baseplate was then advanced with the press fit.  Superior and inferior screws were then placed with alternate tightening with excellent fixation each using a 36-mm screw which was felt to have great bony purchase throughout.  Anterior screw was placed with an 18-mm nonlocking screw which also had excellent purchase.  The posterior glenoid was not sufficient to hold a good screw and therefore, this was left open.  The top and bottom screws were locked in position and a 42 glenosphere was then impacted and locked in place over the baseplate.  The head was then again delivered and prepared using the hand reamer.  A trial implant was placed with a 3-mm trial poly and reduced.  Attention was excellent.  It was a bit difficult to dislocate and therefore, it was reamed slightly deeper and the final implant was then opened.  The canal was repaired with pulse lavage and a cement restrictor was placed.  The size-10 implant was then cemented into place and held until it was hard.  The trial 3-mm implant was then placed and reduced and felt to be excellent soft tissue tension with no propensity for instability.  Therefore, the final 3-mm poly was placed and  impacted.  The joint was reduced and again brought through motion which was excellent with excellent stability.  The joint was then copiously irrigated with pulse lavage and a drain was placed out of the deltoid insertion.  The subscapularis was then repaired with 3 #2 FiberWire which were placed through the lesser tuberosity prior to implantation of the cemented stem.  This was repaired down nicely in front and she still was able to easily able to achieve 40 degrees of external rotation with good excursion of the subscapularis.  The wound was then closed in layers with 2-0 Vicryl and a deep dermal layer of 4-0 Monocryl for skin closure and Steri-Strips applied.  Sterile dressings were then applied including 4 x 4s, ABDs, and tape.  The patient was placed in a sling, allowed to awaken from general anesthesia, transferred to the stretcher and taken to the recovery room in stable condition.  POSTOPERATIVE PLAN:  She will be kept overnight 2 nights for pain control, antibiotics, and therapy and will then be discharged home with her  family assuming she is stable.  Drain will be discontinued on postoperative day #1.  She will have 24 hours of postoperative antibiotics.    Jones Broom, MD    JC/MEDQ  D:  07/27/2011  T:  07/27/2011  Job:  [id number]  Electronically Signed by Jones Broom  on 07/30/2011 09:40:21 AM

## 2011-08-01 ENCOUNTER — Emergency Department (HOSPITAL_COMMUNITY): Payer: Managed Care, Other (non HMO)

## 2011-08-01 ENCOUNTER — Emergency Department (HOSPITAL_COMMUNITY)
Admission: EM | Admit: 2011-08-01 | Discharge: 2011-08-01 | Disposition: A | Discharge status: Home / Self Care | Payer: Managed Care, Other (non HMO) | Attending: Emergency Medicine | Admitting: Emergency Medicine

## 2011-08-01 DIAGNOSIS — R0602 Shortness of breath: Secondary | ICD-10-CM | POA: Insufficient documentation

## 2011-08-01 DIAGNOSIS — R000 Tachycardia, unspecified: Secondary | ICD-10-CM | POA: Insufficient documentation

## 2011-08-01 DIAGNOSIS — M129 Arthropathy, unspecified: Secondary | ICD-10-CM | POA: Insufficient documentation

## 2011-08-01 DIAGNOSIS — Z9889 Other specified postprocedural states: Secondary | ICD-10-CM | POA: Insufficient documentation

## 2011-08-01 DIAGNOSIS — R61 Generalized hyperhidrosis: Secondary | ICD-10-CM | POA: Insufficient documentation

## 2011-08-01 DIAGNOSIS — I10 Essential (primary) hypertension: Secondary | ICD-10-CM | POA: Insufficient documentation

## 2011-08-01 DIAGNOSIS — M810 Age-related osteoporosis without current pathological fracture: Secondary | ICD-10-CM | POA: Insufficient documentation

## 2011-08-01 LAB — DIFFERENTIAL
Basophils Relative: 1 % (ref 0–1)
Eosinophils Absolute: 0.6 10*3/uL (ref 0.0–0.7)
Eosinophils Relative: 9 % — ABNORMAL HIGH (ref 0–5)
Monocytes Absolute: 0.5 10*3/uL (ref 0.1–1.0)
Monocytes Relative: 7 % (ref 3–12)
Neutrophils Relative %: 70 % (ref 43–77)

## 2011-08-01 LAB — POCT I-STAT TROPONIN I: Troponin i, poc: 0.01 ng/mL (ref 0.00–0.08)

## 2011-08-01 LAB — COMPREHENSIVE METABOLIC PANEL
Alkaline Phosphatase: 183 U/L — ABNORMAL HIGH (ref 39–117)
BUN: 10 mg/dL (ref 6–23)
CO2: 30 mEq/L (ref 19–32)
Chloride: 103 mEq/L (ref 96–112)
Creatinine, Ser: 0.54 mg/dL (ref 0.50–1.10)
GFR calc non Af Amer: >60 mL/min (ref >60)
Potassium: 3.8 mEq/L (ref 3.5–5.1)
Total Bilirubin: 0.4 mg/dL (ref 0.3–1.2)

## 2011-08-01 LAB — CK TOTAL AND CKMB (NOT AT ARMC)
CK, MB: 4.1 ng/mL — ABNORMAL HIGH (ref 0.3–4.0)
Total CK: 264 U/L — ABNORMAL HIGH (ref 7–177)

## 2011-08-01 LAB — CBC
MCH: 29 pg (ref 26.0–34.0)
MCHC: 34 g/dL (ref 30.0–36.0)
MCV: 85.3 fL (ref 78.0–100.0)
Platelets: 308 10*3/uL (ref 150–400)
RDW: 13.9 % (ref 11.5–15.5)

## 2011-08-01 MED ORDER — TECHNETIUM TO 99M ALBUMIN AGGREGATED
6.0000 | Freq: Once | INTRAVENOUS | Status: AC | PRN
  Administered 2011-08-01: 6 via INTRAVENOUS

## 2011-08-01 MED ORDER — XENON XE 133 GAS
10.0000 | GAS_FOR_INHALATION | Freq: Once | RESPIRATORY_TRACT | Status: AC | PRN
  Administered 2011-08-01: 10 via RESPIRATORY_TRACT

## 2011-09-03 LAB — BASIC METABOLIC PANEL
BUN: 9 mg/dL (ref 6–23)
CO2: 26 mEq/L (ref 19–32)
Calcium: 9.1 mg/dL (ref 8.4–10.5)
GFR calc non Af Amer: >60 mL/min (ref >60)
Glucose, Bld: 90 mg/dL (ref 70–99)
Potassium: 4.3 mEq/L (ref 3.5–5.1)

## 2011-09-03 LAB — CBC
HCT: 37.7 % (ref 36.0–46.0)
Hemoglobin: 12.3 g/dL (ref 12.0–15.0)
MCHC: 32.5 g/dL (ref 30.0–36.0)
Platelets: 288 10*3/uL (ref 150–400)
RDW: 17.3 % — ABNORMAL HIGH (ref 11.5–15.5)

## 2011-12-30 ENCOUNTER — Ambulatory Visit (HOSPITAL_COMMUNITY)
Admission: RE | Admit: 2011-12-30 | Discharge: 2011-12-30 | Disposition: A | Discharge status: Home / Self Care | Payer: Managed Care, Other (non HMO) | Source: Ambulatory Visit | Attending: Internal Medicine | Admitting: Internal Medicine

## 2011-12-30 ENCOUNTER — Other Ambulatory Visit (HOSPITAL_COMMUNITY): Payer: Self-pay | Admitting: Internal Medicine

## 2011-12-30 DIAGNOSIS — I10 Essential (primary) hypertension: Secondary | ICD-10-CM

## 2011-12-30 DIAGNOSIS — J449 Chronic obstructive pulmonary disease, unspecified: Secondary | ICD-10-CM | POA: Insufficient documentation

## 2011-12-30 DIAGNOSIS — R05 Cough: Secondary | ICD-10-CM | POA: Insufficient documentation

## 2011-12-30 DIAGNOSIS — J4489 Other specified chronic obstructive pulmonary disease: Secondary | ICD-10-CM | POA: Insufficient documentation

## 2011-12-30 DIAGNOSIS — R059 Cough, unspecified: Secondary | ICD-10-CM | POA: Insufficient documentation

## 2012-01-03 ENCOUNTER — Other Ambulatory Visit (HOSPITAL_COMMUNITY): Payer: Self-pay | Admitting: Internal Medicine

## 2012-01-03 DIAGNOSIS — R7989 Other specified abnormal findings of blood chemistry: Secondary | ICD-10-CM

## 2012-01-06 ENCOUNTER — Ambulatory Visit (HOSPITAL_COMMUNITY)
Admission: RE | Admit: 2012-01-06 | Discharge: 2012-01-06 | Disposition: A | Discharge status: Home / Self Care | Payer: Managed Care, Other (non HMO) | Source: Ambulatory Visit | Attending: Internal Medicine | Admitting: Internal Medicine

## 2012-01-06 ENCOUNTER — Ambulatory Visit (HOSPITAL_COMMUNITY): Payer: Medicare Other

## 2012-01-06 DIAGNOSIS — K838 Other specified diseases of biliary tract: Secondary | ICD-10-CM | POA: Insufficient documentation

## 2012-01-06 DIAGNOSIS — Z9889 Other specified postprocedural states: Secondary | ICD-10-CM | POA: Insufficient documentation

## 2012-01-06 DIAGNOSIS — R7989 Other specified abnormal findings of blood chemistry: Secondary | ICD-10-CM

## 2012-09-07 ENCOUNTER — Telehealth: Payer: Self-pay | Admitting: *Deleted

## 2012-09-07 DIAGNOSIS — D649 Anemia, unspecified: Secondary | ICD-10-CM

## 2012-09-07 NOTE — Telephone Encounter (Signed)
Pt last seen 10/2010 for anemia, pt is requesting to see Dr Donnald Garre again because she has really low energy.  Onc tx schedule filled out for lab and f/u with [facility].  SLJ

## 2012-09-07 NOTE — Telephone Encounter (Signed)
labwork from Dr Kathryne Sharper office given to Dr Donnald Garre to review (Iron study, BMET, TSH, Mag, Liver profile, urine culture).  SLJ

## 2012-09-08 ENCOUNTER — Telehealth: Payer: Self-pay | Admitting: Internal Medicine

## 2012-09-08 NOTE — Telephone Encounter (Signed)
s/w pt and she is aware of  her appts  aom 

## 2012-09-08 NOTE — Telephone Encounter (Signed)
pt had lm that 10/21 would not work,called and r/s to 10/22  aom

## 2012-09-14 ENCOUNTER — Other Ambulatory Visit: Payer: Medicare Other | Admitting: Lab

## 2012-09-18 ENCOUNTER — Ambulatory Visit: Payer: Medicare Other | Admitting: Internal Medicine

## 2012-09-19 ENCOUNTER — Other Ambulatory Visit (HOSPITAL_BASED_OUTPATIENT_CLINIC_OR_DEPARTMENT_OTHER): Payer: Medicare Other | Admitting: Lab

## 2012-09-19 ENCOUNTER — Ambulatory Visit (HOSPITAL_BASED_OUTPATIENT_CLINIC_OR_DEPARTMENT_OTHER): Payer: Medicare Other | Admitting: Internal Medicine

## 2012-09-19 ENCOUNTER — Telehealth: Payer: Self-pay | Admitting: Internal Medicine

## 2012-09-19 VITALS — BP 149/79 | HR 69 | Temp 97.6°F | Resp 20 | Wt 130.7 lb

## 2012-09-19 DIAGNOSIS — D509 Iron deficiency anemia, unspecified: Secondary | ICD-10-CM

## 2012-09-19 LAB — IRON AND TIBC
%SAT: 6 % — ABNORMAL LOW (ref 20–55)
TIBC: 377 ug/dL (ref 250–470)
UIBC: 355 ug/dL (ref 125–400)

## 2012-09-19 LAB — CBC WITH DIFFERENTIAL/PLATELET
Basophils Absolute: 0.1 10*3/uL (ref 0.0–0.1)
Eosinophils Absolute: 1.3 10*3/uL — ABNORMAL HIGH (ref 0.0–0.5)
HCT: 34.8 % (ref 34.8–46.6)
HGB: 11.2 g/dL — ABNORMAL LOW (ref 11.6–15.9)
MCH: 25.4 pg (ref 25.1–34.0)
MONO#: 0.5 10*3/uL (ref 0.1–0.9)
NEUT#: 4.2 10*3/uL (ref 1.5–6.5)
NEUT%: 54 % (ref 38.4–76.8)
RDW: 17.1 % — ABNORMAL HIGH (ref 11.2–14.5)
WBC: 7.7 10*3/uL (ref 3.9–10.3)
lymph#: 1.6 10*3/uL (ref 0.9–3.3)

## 2012-09-19 LAB — FERRITIN: Ferritin: 7 ng/mL — ABNORMAL LOW (ref 10–291)

## 2012-09-19 NOTE — Progress Notes (Signed)
[facility] Telephone:([phone]   Fax:([phone]  OFFICE PROGRESS NOTE  STEVENSON, AMY, DO [address] [place] [address]  DIAGNOSIS: Iron deficiency anemia with intolerable oral iron intake  PRIOR THERAPY: Status post Feraheme infusion x2 doses. Last dose was given 10/01/2010  CURRENT THERAPY: None.   INTERVAL HISTORY: Isabel Barnes 71 y.o. female returns to the clinic today for followup visit. The patient was last seen on 11/17/2010 after Feraheme infusion and she has significant improvement in her anemia. She was supposed to come back for followup visit in 3 months for reevaluation but the patient disappeared for the last 2 years and was lost to followup. She has been complaining of increasing fatigue and weakness recently. She has to take naps during the day because of the fatigue. She had repeat CBC in September of 2016 that showed decline in her hemoglobin down to 9.7 down from 12.4 during her last visit 2 years ago. The patient came back for followup visit and to resume her treatment with Feraheme. She had occasional dizzy spells. She denied having any significant weight loss or night sweats. She has no chest pain, shortness breath, cough or hemoptysis. No significant rectal bleeding.  ALLERGIES:  is allergic to dilaudid; betadine; ciprofloxacin hcl; codeine; latex; methadone; metronidazole; oysters; penicillins; and sulfur.  MEDICATIONS:  No current outpatient prescriptions on file.    REVIEW OF SYSTEMS:  A comprehensive review of systems was negative except for: Constitutional: positive for fatigue   PHYSICAL EXAMINATION: General appearance: alert, cooperative and no distress Head: Normocephalic, without obvious abnormality, atraumatic Lymph nodes: Cervical, supraclavicular, and axillary nodes normal. Resp: clear to auscultation bilaterally Cardio: regular rate and rhythm, S1, S2 normal, no murmur, click, rub or gallop GI: soft,  non-tender; bowel sounds normal; no masses,  no organomegaly Extremities: extremities normal, atraumatic, no cyanosis or edema  ECOG PERFORMANCE STATUS: 1 - Symptomatic but completely ambulatory  Blood pressure 149/79, pulse 69, temperature 97.6 F (36.4 C), temperature source Oral, resp. rate 20, weight 130 lb 11.2 oz (59.285 kg).  LABORATORY DATA: Lab Results  Component Value Date   WBC 6.8 08/01/2011   HGB 9.7* 08/01/2011   HCT 28.5* 08/01/2011   MCV 85.3 08/01/2011   PLT 308 08/01/2011      Chemistry      Component Value Date/Time   NA 140 08/01/2011 1236   K 3.8 08/01/2011 1236   CL 103 08/01/2011 1236   CO2 30 08/01/2011 1236   BUN 10 08/01/2011 1236   CREATININE 0.54 08/01/2011 1236      Component Value Date/Time   CALCIUM 9.0 08/01/2011 1236   ALKPHOS 183* 08/01/2011 1236   AST 36 08/01/2011 1236   ALT 87* 08/01/2011 1236   BILITOT 0.4 08/01/2011 1236       RADIOGRAPHIC STUDIES: No results found.  ASSESSMENT: This is a very pleasant 71 years old white female with history of iron deficiency anemia with and tolerability to oral iron tablets. The patient is status post treatment with Feraheme 2 years ago significant improvement in her anemia but she was lost to followup and now she presenting with significant anemia again.  PLAN: I recommended for the patient to resume her treatment with Feraheme. She will receive 2 doses first dose on 09/22/2012 and the next dose on 09/29/2012. I will repeat her CBC and iron study today as well as in 2 months before her upcoming visit for evaluation of her anemia  and treatment effect. She would come back for followup visit in 2 months for evaluation. She was advised to call me immediately if she has any concerning symptoms in the interval.

## 2012-09-19 NOTE — Patient Instructions (Signed)
You have iron deficiency anemia. I would consider you again for treatment with Feraheme. Followup in 2 months with repeat CBC and iron study

## 2012-09-19 NOTE — Telephone Encounter (Signed)
gv and printed pt appt schedule for Oct, Nov, and Dec...the patient is aware of fereheme dates 10.25.13 and 11.1.13.Marland Kitcheni will call pt with time of appt.

## 2012-09-20 ENCOUNTER — Telehealth: Payer: Self-pay | Admitting: Internal Medicine

## 2012-09-20 NOTE — Telephone Encounter (Signed)
pt returned call and i advised her on her 10.25.13 and 11.1.13 appts.

## 2012-09-22 ENCOUNTER — Ambulatory Visit (HOSPITAL_BASED_OUTPATIENT_CLINIC_OR_DEPARTMENT_OTHER): Payer: Medicare Other

## 2012-09-22 VITALS — BP 160/78 | HR 74 | Temp 98.0°F | Resp 16

## 2012-09-22 DIAGNOSIS — D509 Iron deficiency anemia, unspecified: Secondary | ICD-10-CM

## 2012-09-22 MED ORDER — FERUMOXYTOL INJECTION 510 MG/17 ML
510.0000 mg | Freq: Once | INTRAVENOUS | Status: AC
  Administered 2012-09-22: 510 mg via INTRAVENOUS
  Filled 2012-09-22: qty 17

## 2012-09-22 MED ORDER — SODIUM CHLORIDE 0.9 % IV SOLN
Freq: Once | INTRAVENOUS | Status: AC
  Administered 2012-09-22: 14:00:00 via INTRAVENOUS

## 2012-09-22 NOTE — Patient Instructions (Addendum)
[facility] Discharge Instructions for Patients Receiving Feraheme.  Today you received the following Feraheme.    BELOW ARE SYMPTOMS THAT SHOULD BE REPORTED IMMEDIATELY:  *FEVER GREATER THAN 100.5 F  *CHILLS WITH OR WITHOUT FEVER  NAUSEA AND VOMITING THAT IS NOT CONTROLLED WITH YOUR NAUSEA MEDICATION  *UNUSUAL SHORTNESS OF BREATH  *UNUSUAL BRUISING OR BLEEDING  TENDERNESS IN MOUTH AND THROAT WITH OR WITHOUT PRESENCE OF ULCERS  *URINARY PROBLEMS  *BOWEL PROBLEMS  UNUSUAL RASH Items with * indicate a potential emergency and should be followed up as soon as possible.  One of the nurses will contact you 24 hours after your treatment. Please let the nurse know about any problems that you may have experienced. Feel free to call the clinic you have any questions or concerns. The clinic phone number is [phone].   I have been informed and understand all the instructions given to me. I know to contact the clinic, my physician, or go to the Emergency Department if any problems should occur. I do not have any questions at this time, but understand that I may call the clinic during office hours   should I have any questions or need assistance in obtaining follow up care.    __________________________________________  _____________  __________ Signature of Patient or Authorized Representative            Date                   Time    __________________________________________ Nurse's Signature

## 2012-09-29 ENCOUNTER — Ambulatory Visit (HOSPITAL_BASED_OUTPATIENT_CLINIC_OR_DEPARTMENT_OTHER): Payer: Medicare Other

## 2012-09-29 VITALS — BP 148/76 | HR 74 | Temp 98.6°F | Resp 20

## 2012-09-29 DIAGNOSIS — D509 Iron deficiency anemia, unspecified: Secondary | ICD-10-CM

## 2012-09-29 MED ORDER — FERUMOXYTOL INJECTION 510 MG/17 ML
510.0000 mg | Freq: Once | INTRAVENOUS | Status: AC
  Administered 2012-09-29: 510 mg via INTRAVENOUS
  Filled 2012-09-29: qty 17

## 2012-09-29 MED ORDER — SODIUM CHLORIDE 0.9 % IV SOLN: Freq: Once | INTRAVENOUS | Status: DC

## 2012-09-29 NOTE — Patient Instructions (Signed)

## 2012-11-20 ENCOUNTER — Other Ambulatory Visit (HOSPITAL_BASED_OUTPATIENT_CLINIC_OR_DEPARTMENT_OTHER): Payer: Medicare Other | Admitting: Lab

## 2012-11-20 ENCOUNTER — Ambulatory Visit (HOSPITAL_BASED_OUTPATIENT_CLINIC_OR_DEPARTMENT_OTHER): Payer: Medicare Other | Admitting: Internal Medicine

## 2012-11-20 ENCOUNTER — Encounter: Payer: Self-pay | Admitting: Internal Medicine

## 2012-11-20 VITALS — BP 127/64 | HR 71 | Temp 97.4°F | Resp 18 | Ht 62.5 in | Wt 133.0 lb

## 2012-11-20 DIAGNOSIS — D509 Iron deficiency anemia, unspecified: Secondary | ICD-10-CM

## 2012-11-20 LAB — IRON AND TIBC
%SAT: 44 % (ref 20–55)
Iron: 96 ug/dL (ref 42–145)
UIBC: 124 ug/dL — ABNORMAL LOW (ref 125–400)

## 2012-11-20 LAB — CBC WITH DIFFERENTIAL/PLATELET
BASO%: 1.3 % (ref 0.0–2.0)
Eosinophils Absolute: 3.5 10*3/uL — ABNORMAL HIGH (ref 0.0–0.5)
LYMPH%: 19.6 % (ref 14.0–49.7)
MCHC: 33.8 g/dL (ref 31.5–36.0)
MONO#: 0.4 10*3/uL (ref 0.1–0.9)
MONO%: 4.7 % (ref 0.0–14.0)
NEUT#: 3 10*3/uL (ref 1.5–6.5)
Platelets: 235 10*3/uL (ref 145–400)
RBC: 4.34 10*6/uL (ref 3.70–5.45)
RDW: 19.7 % — ABNORMAL HIGH (ref 11.2–14.5)
WBC: 8.8 10*3/uL (ref 3.9–10.3)

## 2012-11-20 LAB — FERRITIN: Ferritin: 140 ng/mL (ref 10–291)

## 2012-11-20 NOTE — Progress Notes (Signed)
[facility] Telephone:([phone]   Fax:([phone]) [phone]  OFFICE PROGRESS NOTE  STEVENSON, AMY, DO [address] [place] [address]  DIAGNOSIS: Iron deficiency anemia with intolerable oral iron intake   PRIOR THERAPY: Status post Feraheme infusion x 4 doses. Last dose was given 09/29/2012   CURRENT THERAPY: None.  INTERVAL HISTORY: Isabel Barnes 71 y.o. female returns to the clinic today for two-month followup visit. The patient is feeling fine today with less fatigue but she continues to have aching pain all over her body secondary to her rheumatologic disorder. The patient tolerated the Feraheme infusion fairly well. She denied having any significant disease pelvis. She denied having any bleeding issues. She has no significant weight loss or night sweats. She had repeat CBC and iron study performed earlier today and she is here for evaluation and discussion of her lab results.  ALLERGIES:  is allergic to dilaudid; singulair; betadine; ciprofloxacin hcl; codeine; latex; methadone; metronidazole; oysters; penicillins; and sulfur.  MEDICATIONS:  Current Outpatient Prescriptions  Medication Sig Dispense Refill  . albuterol (PROAIR HFA) 108 (90 BASE) MCG/ACT inhaler Inhale 2 puffs into the lungs every 6 (six) hours as needed.      Marland Kitchen aspirin 81 MG tablet Take 81 mg by mouth daily.      . Biotin 10 MG CAPS Take 10 mg by mouth daily.      . calcium-vitamin D (OSCAL WITH D) 500-200 MG-UNIT per tablet Take 1 tablet by mouth daily.      . celecoxib (CELEBREX) 200 MG capsule Take 200 mg by mouth 2 (two) times daily.      . cholecalciferol (VITAMIN D) 1000 UNITS tablet Take 2,000 Units by mouth daily.      . diclofenac (VOLTAREN) 0.1 % ophthalmic solution 1 drop 4 (four) times daily. As needed      . diphenhydrAMINE (SOMINEX) 25 MG tablet Take 25 mg by mouth. As needed      . estrogens, conjugated, (PREMARIN) 0.625 MG tablet Take 0.625 mg by mouth every  other day. Take daily for 21 days then do not take for 7 days.      . fish oil-omega-3 fatty acids 1000 MG capsule Take 2 g by mouth daily.      . Flaxseed, Linseed, 1200 MG CAPS Take 1,200 mg by mouth. Take 2 tablets daily      . HYDROcodone-acetaminophen (NORCO) 10-325 MG per tablet Take 1 tablet by mouth every 4 (four) hours as needed.      . magnesium 30 MG tablet Take 30 mg by mouth 2 (two) times daily.      . Multiple Vitamins-Minerals (MULTIVITAMIN WITH MINERALS) tablet Take 1 tablet by mouth daily.      . prochlorperazine (COMPAZINE) 10 MG tablet Take 10 mg by mouth every 8 (eight) hours as needed.      . Red Yeast Rice 600 MG CAPS Take 600 mg by mouth daily.      . Turmeric 500 MG CAPS Take 500 mg by mouth daily.      . vitamin B-12 (CYANOCOBALAMIN) 250 MCG tablet Take 250 mcg by mouth daily.        REVIEW OF SYSTEMS:  A comprehensive review of systems was negative except for: Musculoskeletal: positive for arthralgias   PHYSICAL EXAMINATION: General appearance: alert, cooperative and no distress Head: Normocephalic, without obvious abnormality, atraumatic Neck: no adenopathy Resp: clear to auscultation bilaterally Cardio: regular rate and rhythm, S1, S2  normal, no murmur, click, rub or gallop GI: soft, non-tender; bowel sounds normal; no masses,  no organomegaly Extremities: extremities normal, atraumatic, no cyanosis or edema  ECOG PERFORMANCE STATUS: 1 - Symptomatic but completely ambulatory  Blood pressure 127/64, pulse 71, temperature 97.4 F (36.3 C), temperature source Oral, resp. rate 18, height 5' 2.5" (1.588 m), weight 133 lb (60.328 kg).  LABORATORY DATA: Lab Results  Component Value Date   WBC 8.8 11/20/2012   HGB 12.7 11/20/2012   HCT 37.7 11/20/2012   MCV 86.8 11/20/2012   PLT 235 11/20/2012      Chemistry      Component Value Date/Time   NA 140 08/01/2011 1236   K 3.8 08/01/2011 1236   CL 103 08/01/2011 1236   CO2 30 08/01/2011 1236   BUN 10 08/01/2011 1236    CREATININE 0.54 08/01/2011 1236      Component Value Date/Time   CALCIUM 9.0 08/01/2011 1236   ALKPHOS 183* 08/01/2011 1236   AST 36 08/01/2011 1236   ALT 87* 08/01/2011 1236   BILITOT 0.4 08/01/2011 1236       RADIOGRAPHIC STUDIES: No results found.  ASSESSMENT: This is a very pleasant 71 years old white female with history of iron deficiency anemia status post Feraheme infusion performed on 09/29/2012 with significant improvement in her hemoglobin and hematocrit. The patient continues to have mild fatigue and aching pain all over her body most likely secondary to rheumatologic disorders. Iron study still pending  PLAN: I discussed the lab result with the patient today. I recommended for her to continue on observation for now with repeat CBC and iron study in 3 months. If the pending iron study today showed any significant abnormalities I will contact the patient with recommendation. She was advised to call immediately if she has any concerning symptoms in the interval.  All questions were answered. The patient knows to call the clinic with any problems, questions or concerns. We can certainly see the patient much sooner if necessary.

## 2012-11-20 NOTE — Patient Instructions (Addendum)
You have improvement in your hemoglobin and hematocrit on the blood work today. Followup in 3 months with repeat CBC and iron study

## 2012-11-21 ENCOUNTER — Telehealth: Payer: Self-pay | Admitting: Internal Medicine

## 2012-11-21 NOTE — Telephone Encounter (Signed)
s.w. pt and gv March 2014 appt d/t....pt aware

## 2012-12-26 ENCOUNTER — Ambulatory Visit
Admission: RE | Admit: 2012-12-26 | Discharge: 2012-12-26 | Disposition: A | Discharge status: Home / Self Care | Payer: Medicare Other | Source: Ambulatory Visit | Attending: Physician Assistant | Admitting: Physician Assistant

## 2012-12-26 ENCOUNTER — Other Ambulatory Visit: Payer: Self-pay | Admitting: Physician Assistant

## 2012-12-26 DIAGNOSIS — L539 Erythematous condition, unspecified: Secondary | ICD-10-CM

## 2012-12-26 DIAGNOSIS — R609 Edema, unspecified: Secondary | ICD-10-CM

## 2012-12-26 DIAGNOSIS — M79604 Pain in right leg: Secondary | ICD-10-CM

## 2013-01-01 ENCOUNTER — Ambulatory Visit (HOSPITAL_COMMUNITY)
Admission: RE | Admit: 2013-01-01 | Discharge: 2013-01-01 | Disposition: A | Discharge status: Home / Self Care | Payer: Medicare Other | Source: Ambulatory Visit | Attending: Internal Medicine | Admitting: Internal Medicine

## 2013-01-01 ENCOUNTER — Other Ambulatory Visit (HOSPITAL_COMMUNITY): Payer: Self-pay | Admitting: Internal Medicine

## 2013-01-01 DIAGNOSIS — I10 Essential (primary) hypertension: Secondary | ICD-10-CM

## 2013-01-01 DIAGNOSIS — Z0000 Encounter for general adult medical examination without abnormal findings: Secondary | ICD-10-CM | POA: Insufficient documentation

## 2013-02-05 ENCOUNTER — Other Ambulatory Visit: Payer: Self-pay | Admitting: Urology

## 2013-02-06 ENCOUNTER — Encounter (HOSPITAL_BASED_OUTPATIENT_CLINIC_OR_DEPARTMENT_OTHER): Payer: Self-pay | Admitting: *Deleted

## 2013-02-06 NOTE — Progress Notes (Signed)
To [facility] at 0945-Istat on arrival.Npo after Mn-will take levothyroxine,pain medication with small amt water that am -instructed to bring inhaler.

## 2013-02-07 NOTE — H&P (Signed)
ctive Problems Problems  1. Abdominal Pain Above The Pubic Area (Suprapubic) 789.09 2. Gross Hematuria 599.71 3. Microscopic Hematuria 599.72 4. Nephrolithiasis 592.0 5. Urinary Stream Starts And Stops 788.61  History of Present Illness     Isabel Barnes returns today in f/u for her left ureteral stone.  She had a CT done on 2/28 that showed a 6mm left mid ureteral stone with obstruction and she returns today for further evaluation.  The stone had HU values to 600 but is not visible on KUB over the bony pelvis where it is located on CT.   A KUB today still doesn't show the stone.   Her UA has 3-6 RBC's.   She originally presented in early February with gross hematuria and pain in the suprapubic area.  She has not had much flank pain but she was sore with the [place] today which still shows some hydronephrosis and a LLP stone.  She has no other associated signs or symptoms.   Past Medical History Problems  1. History of  Abdominal Pain In The Left Lower Belly (LLQ) 789.04 2. 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 History of  Hypercholesterolemia 272.0 4. History of  Hypothyroidism 244.9 5. History of  Nausea With Vomiting 787.01 6. History of  Ureteral Stone Left 592.1 7. 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 History of  Back Surgery 2. History of  Cataract Surgery 3. History of  Cholecystectomy 4. History of  Colonoscopy (Fiberoptic) 5. History of  Cystoscopy With Ureteroscopy With Removal Of Calculus 6. History of  Foot Surgery 7. History of  Hemorrhoidectomy 8. History of  Hysterectomy V45.77 9. History of  Nasal Septal Deviation Repair 10. History of  Neck Surgery 11. History of  Rotator Cuff Repair 12. History of  Shoulder Surgery 13. History of  Shoulder Surgery Right  Current Meds 1. Alfuzosin HCl ER 10 MG Oral Tablet Extended Release 24 Hour; TAKE 1 TABLET Bedtime;  Therapy: 04Mar2014 to (Evaluate:27Feb2015)  Requested for: 05Mar2014; Last  Rx:04Mar2014 2. Aspirin 81 MG Oral Tablet; Therapy: (Recorded:28Jan2011) to 3. Azithromycin 250 MG Oral Tablet; Therapy: (Recorded:06Mar2014) to 4. Benadryl TABS; Therapy: (Recorded:10Feb2014) to 5. Biotin TABS; Therapy: (Recorded:10Feb2014) to 6. Caltrate 600+D TABS; Therapy: (Recorded:28Jan2011) to 7. CeleBREX 200 MG Oral Capsule; Therapy: (Recorded:28Jan2011) to 8. Fish Oil CAPS; Therapy: (Recorded:28Jan2011) to 9. Flax Seed Oil CAPS; Therapy: (Recorded:28Jan2011) to 10. Furosemide 20 MG Oral Tablet; Therapy: 10Feb2014 to 11. Hydrocodone-Acetaminophen 10-325 MG Oral Tablet; Therapy: (Recorded:10Feb2014) to 12. Ketoconazole 2 % External Cream; Therapy: 31Jan2013 to 13. Levothyroxine Sodium 50 MCG Oral Tablet; Therapy: 30Jan2014 to 14. Magnesium TABS; Therapy: (Recorded:28Jan2011) to 15. Multi-Vitamin TABS; Therapy: (Recorded:28Jan2011) to 16. PredniSONE 10 MG Oral Tablet; Therapy: (Recorded:06Mar2014) to 17. ProAir HFA 108 (90 Base) MCG/ACT Inhalation Aerosol Solution; Therapy: (Recorded:28Jan2011)   to 18. Prochlorperazine Maleate 10 MG Oral Tablet; Therapy: (Recorded:28Jan2011) to 19. Red Yeast Rice CAPS; Therapy: (Recorded:10Feb2014) to 20. Turmeric Curcumin CAPS; Therapy: (Recorded:28Jan2011) to 21. Vitamin B-12 TABS; Therapy: (Recorded:28Jan2011) to 22. Voltaren 1 % GEL; Therapy: (Recorded:28Jan2011) to  Allergies Medication  1. Cipro TABS 2. Methadose TABS 3. MetroNIDAZOLE CAPS 4. NSAIDs 5. Nucynta TABS 6. Sulfa Drugs 7. Amoxicillin TABS 8. Ampicillin CAPS 9. Betadine SOLN 10. Codeine Derivatives 11. Latex Exam Gloves MISC 12. Neosporin OINT 13. OxyCONTIN TB12 14. Penicillins 15. Xanax TABS Non-Medication  16. Shellfish Denied  17. Gastric Complex-HP CAPS  Family History Problems  1. Paternal history of  Death In The Family Father  deceased age 59--stroke 2. Paternal history of  Death In The Family Mother deceased age 52-- 3. Paternal history of  Family  Health Status Number Of Children 1 son 1 daughter 39. Paternal history of  Nephrolithiasis  Social History Problems  1. Caffeine Use 2 coffee per day 2. Marital History - Currently Married 3. Tobacco Use V15.82 smoked for 75yrs--quit Denied  4. History of  Alcohol Use  Review of Systems  Gastrointestinal: abdominal pain, but no nausea.  Constitutional: no fever.  Cardiovascular: leg swelling (which is the reason for the furosemide. ).    Vitals Vital Signs [Data Includes: Last 1 Day]  06Mar2014 04:44PM  Blood Pressure: 172 / 75 Temperature: 98.3 F Heart Rate: 72  Physical Exam Constitutional: Well nourished and well developed . No acute distress.  Pulmonary: No respiratory distress and normal respiratory rhythm and effort.  Cardiovascular: Heart rate and rhythm are normal . No obvious murmurs are appreciated. she has some ankle edema that is now mild  Abdomen: The abdomen is not firm and no rebound. Mild tenderness in the LLQ is present. moderate left CVA tenderness.    Results/Data Urine [Data Includes: Last 1 Day]   06Mar2014  COLOR YELLOW   APPEARANCE CLEAR   SPECIFIC GRAVITY 1.020   pH 6.5   GLUCOSE NEG mg/dL  BILIRUBIN NEG   KETONE NEG mg/dL  BLOOD MOD   PROTEIN NEG mg/dL  UROBILINOGEN 0.2 mg/dL  NITRITE NEG   LEUKOCYTE ESTERASE NEG   SQUAMOUS EPITHELIAL/HPF RARE   WBC 0-2 WBC/hpf  RBC 3-6 RBC/hpf  BACTERIA FEW   CRYSTALS NONE SEEN   CASTS NONE SEEN    The following images/tracing/specimen were independently visualized:  KUB today doesn't show a stone in the area of the left ureter. 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 A limited left renal US shows a 5.20mm LLP stone and moderate hydro. The bladder is empty. See study sheet for details.    Assessment Assessed  1. Ureteral Stone Left 592.1 2. Nephrolithiasis 592.0   She has a left mid ureteral stone with obstruction and persistant pain.   Plan Nephrolithiasis  (592.0)  1. RENAL U/S LEFT  Done: 06Mar2014 12:00AM Ureteral Stone (592.1)  2. Follow-up Schedule Surgery Office  Follow-up  Done: 06Mar2014   I am going to get her set up for a left ureteroscopic stone extraction with possible stent and reviewed the risks of bleeding, infection, ureteral injury, need for a stent and secondary procedures, thrombotic events and anesthetic complications.   Discussion/Summary   CC: Dr. Marisue Brooklyn.

## 2013-02-08 ENCOUNTER — Ambulatory Visit (HOSPITAL_BASED_OUTPATIENT_CLINIC_OR_DEPARTMENT_OTHER): Payer: Medicare Other | Admitting: Anesthesiology

## 2013-02-08 ENCOUNTER — Encounter (HOSPITAL_BASED_OUTPATIENT_CLINIC_OR_DEPARTMENT_OTHER): Payer: Self-pay | Admitting: *Deleted

## 2013-02-08 ENCOUNTER — Encounter (HOSPITAL_BASED_OUTPATIENT_CLINIC_OR_DEPARTMENT_OTHER)
Admission: RE | Disposition: A | Discharge status: Home / Self Care | Payer: Self-pay | Source: Ambulatory Visit | Attending: Urology

## 2013-02-08 ENCOUNTER — Encounter (HOSPITAL_BASED_OUTPATIENT_CLINIC_OR_DEPARTMENT_OTHER): Payer: Self-pay | Admitting: Anesthesiology

## 2013-02-08 ENCOUNTER — Ambulatory Visit (HOSPITAL_BASED_OUTPATIENT_CLINIC_OR_DEPARTMENT_OTHER)
Admission: RE | Admit: 2013-02-08 | Discharge: 2013-02-08 | Disposition: A | Discharge status: Home / Self Care | Payer: Medicare Other | Source: Ambulatory Visit | Attending: Urology | Admitting: Urology

## 2013-02-08 DIAGNOSIS — N200 Calculus of kidney: Secondary | ICD-10-CM | POA: Insufficient documentation

## 2013-02-08 DIAGNOSIS — E7800 Pure hypercholesterolemia, unspecified: Secondary | ICD-10-CM | POA: Insufficient documentation

## 2013-02-08 DIAGNOSIS — Z79899 Other long term (current) drug therapy: Secondary | ICD-10-CM | POA: Insufficient documentation

## 2013-02-08 DIAGNOSIS — N201 Calculus of ureter: Secondary | ICD-10-CM | POA: Insufficient documentation

## 2013-02-08 DIAGNOSIS — E039 Hypothyroidism, unspecified: Secondary | ICD-10-CM | POA: Insufficient documentation

## 2013-02-08 DIAGNOSIS — Z7982 Long term (current) use of aspirin: Secondary | ICD-10-CM | POA: Insufficient documentation

## 2013-02-08 DIAGNOSIS — N1330 Unspecified hydronephrosis: Secondary | ICD-10-CM | POA: Insufficient documentation

## 2013-02-08 HISTORY — DX: Other complications of anesthesia, initial encounter: T88.59XA

## 2013-02-08 HISTORY — DX: Adverse effect of unspecified anesthetic, initial encounter: T41.45XA

## 2013-02-08 HISTORY — DX: Unspecified inflammatory spondylopathy, site unspecified: M46.90

## 2013-02-08 HISTORY — DX: Chronic pain syndrome: G89.4

## 2013-02-08 HISTORY — DX: Hypothyroidism, unspecified: E03.9

## 2013-02-08 HISTORY — DX: Unspecified osteoarthritis, unspecified site: M19.90

## 2013-02-08 HISTORY — PX: HOLMIUM LASER APPLICATION: SHX5852

## 2013-02-08 LAB — POCT I-STAT 4, (NA,K, GLUC, HGB,HCT)
Glucose, Bld: 88 mg/dL (ref 70–99)
Potassium: 3.5 mEq/L (ref 3.5–5.1)

## 2013-02-08 SURGERY — CYSTOURETEROSCOPY, WITH STENT INSERTION
Anesthesia: General | Site: Ureter | Laterality: Left | Wound class: Clean Contaminated

## 2013-02-08 MED ORDER — SODIUM CHLORIDE 0.9 % IJ SOLN
3.0000 mL | INTRAMUSCULAR | Status: DC | PRN
  Filled 2013-02-08: qty 3

## 2013-02-08 MED ORDER — STERILE WATER FOR IRRIGATION IR SOLN: Status: DC | PRN

## 2013-02-08 MED ORDER — SODIUM CHLORIDE 0.9 % IV SOLN
250.0000 mL | INTRAVENOUS | Status: DC | PRN
  Filled 2013-02-08: qty 250

## 2013-02-08 MED ORDER — FENTANYL CITRATE 0.05 MG/ML IJ SOLN
INTRAMUSCULAR | Status: DC | PRN
  Administered 2013-02-08: 50 ug via INTRAVENOUS
  Administered 2013-02-08 (×2): 25 ug via INTRAVENOUS

## 2013-02-08 MED ORDER — ONDANSETRON HCL 4 MG/2ML IJ SOLN
4.0000 mg | Freq: Four times a day (QID) | INTRAMUSCULAR | Status: DC | PRN
  Filled 2013-02-08: qty 2

## 2013-02-08 MED ORDER — DEXTROSE 5 % IV SOLN
1.0000 g | Freq: Once | INTRAVENOUS | Status: DC
  Filled 2013-02-08: qty 10

## 2013-02-08 MED ORDER — PROPOFOL 10 MG/ML IV BOLUS
INTRAVENOUS | Status: DC | PRN
  Administered 2013-02-08: 140 mg via INTRAVENOUS

## 2013-02-08 MED ORDER — SODIUM CHLORIDE 0.9 % IJ SOLN
3.0000 mL | Freq: Two times a day (BID) | INTRAMUSCULAR | Status: DC
  Filled 2013-02-08: qty 3

## 2013-02-08 MED ORDER — LACTATED RINGERS IV SOLN
INTRAVENOUS | Status: DC
  Administered 2013-02-08: 10:00:00 via INTRAVENOUS
  Filled 2013-02-08: qty 1000

## 2013-02-08 MED ORDER — MIDAZOLAM HCL 5 MG/5ML IJ SOLN
INTRAMUSCULAR | Status: DC | PRN
  Administered 2013-02-08: 1 mg via INTRAVENOUS

## 2013-02-08 MED ORDER — SODIUM CHLORIDE 0.9 % IR SOLN
Status: DC | PRN
  Administered 2013-02-08: 6000 mL

## 2013-02-08 MED ORDER — ACETAMINOPHEN 10 MG/ML IV SOLN
INTRAVENOUS | Status: DC | PRN
  Administered 2013-02-08: 1000 mg via INTRAVENOUS

## 2013-02-08 MED ORDER — ACETAMINOPHEN 650 MG RE SUPP
650.0000 mg | RECTAL | Status: DC | PRN
  Filled 2013-02-08: qty 1

## 2013-02-08 MED ORDER — ACETAMINOPHEN 325 MG PO TABS
650.0000 mg | ORAL_TABLET | ORAL | Status: DC | PRN
  Filled 2013-02-08: qty 2

## 2013-02-08 MED ORDER — ONDANSETRON HCL 4 MG/2ML IJ SOLN
INTRAMUSCULAR | Status: DC | PRN
  Administered 2013-02-08: 4 mg via INTRAVENOUS

## 2013-02-08 MED ORDER — DEXTROSE 5 % IV SOLN
2.0000 g | Freq: Once | INTRAVENOUS | Status: AC
  Administered 2013-02-08: 2 g via INTRAVENOUS
  Filled 2013-02-08: qty 2

## 2013-02-08 MED ORDER — HYDROCODONE-ACETAMINOPHEN 10-325 MG PO TABS: 1.0000 | ORAL_TABLET | ORAL | Status: DC | PRN

## 2013-02-08 MED ORDER — PHENAZOPYRIDINE HCL 200 MG PO TABS: 200.0000 mg | ORAL_TABLET | Freq: Three times a day (TID) | ORAL | Status: DC | PRN

## 2013-02-08 MED ORDER — FENTANYL CITRATE 0.05 MG/ML IJ SOLN
25.0000 ug | INTRAMUSCULAR | Status: DC | PRN
  Filled 2013-02-08: qty 1

## 2013-02-08 MED ORDER — IOHEXOL 350 MG/ML SOLN
INTRAVENOUS | Status: DC | PRN
  Administered 2013-02-08: 3 mL via URETHRAL

## 2013-02-08 MED ORDER — DEXAMETHASONE SODIUM PHOSPHATE 4 MG/ML IJ SOLN
INTRAMUSCULAR | Status: DC | PRN
  Administered 2013-02-08: 8 mg via INTRAVENOUS

## 2013-02-08 MED ORDER — PROMETHAZINE HCL 25 MG/ML IJ SOLN
6.2500 mg | INTRAMUSCULAR | Status: DC | PRN
  Filled 2013-02-08: qty 1

## 2013-02-08 SURGICAL SUPPLY — 34 items
BAG DRAIN URO-CYSTO SKYTR STRL (DRAIN) ×2 IMPLANT
BASKET LASER NITINOL 1.9FR (BASKET) ×2 IMPLANT
BASKET SEGURA 3FR (UROLOGICAL SUPPLIES) IMPLANT
BASKET STNLS GEMINI 4WIRE 3FR (BASKET) IMPLANT
BASKET ZERO TIP NITINOL 2.4FR (BASKET) IMPLANT
BRUSH URET BIOPSY 3F (UROLOGICAL SUPPLIES) IMPLANT
CANISTER SUCT LVC 12 LTR MEDI- (MISCELLANEOUS) ×2 IMPLANT
CATH URET 5FR 28IN CONE TIP (BALLOONS)
CATH URET 5FR 28IN OPEN ENDED (CATHETERS) ×2 IMPLANT
CATH URET 5FR 70CM CONE TIP (BALLOONS) IMPLANT
CLOTH BEACON ORANGE TIMEOUT ST (SAFETY) ×2 IMPLANT
DRAPE CAMERA CLOSED 9X96 (DRAPES) ×2 IMPLANT
ELECT REM PT RETURN 9FT ADLT (ELECTROSURGICAL)
ELECTRODE REM PT RTRN 9FT ADLT (ELECTROSURGICAL) IMPLANT
GLOVE INDICATOR 7.5 STRL GRN (GLOVE) ×2 IMPLANT
GLOVE SKINSENSE NS SZ7.5 (GLOVE) ×1
GLOVE SKINSENSE STRL SZ7.5 (GLOVE) ×1 IMPLANT
GLOVE SURG SS PI 8.0 STRL IVOR (GLOVE) ×2 IMPLANT
GOWN STRL NON-REIN LRG LVL3 (GOWN DISPOSABLE) ×2 IMPLANT
GOWN STRL REIN XL XLG (GOWN DISPOSABLE) ×2 IMPLANT
GUIDEWIRE 0.038 PTFE COATED (WIRE) IMPLANT
GUIDEWIRE ANG ZIPWIRE 038X150 (WIRE) IMPLANT
GUIDEWIRE STR DUAL SENSOR (WIRE) ×2 IMPLANT
IV NS IRRIG 3000ML ARTHROMATIC (IV SOLUTION) ×4 IMPLANT
KIT BALLIN UROMAX 15FX10 (LABEL) IMPLANT
KIT BALLN UROMAX 15FX4 (MISCELLANEOUS) IMPLANT
KIT BALLN UROMAX 26 75X4 (MISCELLANEOUS)
LASER FIBER DISP (UROLOGICAL SUPPLIES) ×2 IMPLANT
LASER FIBER DISP 1000U (UROLOGICAL SUPPLIES) IMPLANT
PACK CYSTOSCOPY (CUSTOM PROCEDURE TRAY) ×2 IMPLANT
SET HIGH PRES BAL DIL (LABEL)
SHEATH ACCESS URETERAL 38CM (SHEATH) IMPLANT
SHEATH ACCESS URETERAL 54CM (SHEATH) IMPLANT
STENT URET 6FRX24 CONTOUR (STENTS) ×2 IMPLANT

## 2013-02-08 NOTE — Brief Op Note (Signed)
02/08/2013  11:25 AM  PATIENT:  [name]  72 y.o. female  PRE-OPERATIVE DIAGNOSIS:  LEFT MID URETERAL STONE  POST-OPERATIVE DIAGNOSIS:  LEFT MID URETERAL STONE  PROCEDURE:  Procedure(s): CYSTOSCOPY WITH LEFT URETEROSCOPY,RETROGRADE  AND STONE EXTRACTION AND  STENT PLACEMENT (Left) HOLMIUM LASER APPLICATION (Left)  SURGEON:  Surgeon(s) and Role:    * Anner Crete, MD - Primary  PHYSICIAN ASSISTANT:   ASSISTANTS: none   ANESTHESIA:   general  EBL:  Total I/O In: 200 [I.V.:200] Out: -   BLOOD ADMINISTERED:none  DRAINS: 6x24 left JJ stent   LOCAL MEDICATIONS USED:  NONE  SPECIMEN:  Source of Specimen:  stone fragments   DISPOSITION OF SPECIMEN:  to family  COUNTS:  YES  TOURNIQUET:  * No tourniquets in log *  DICTATION: .Other Dictation: Dictation Number [id number]  PLAN OF CARE: Discharge to home after PACU  PATIENT DISPOSITION:  PACU - hemodynamically stable.   Delay start of Pharmacological VTE agent (>24hrs) due to surgical blood loss or risk of bleeding: not applicable

## 2013-02-08 NOTE — Anesthesia Postprocedure Evaluation (Signed)
  Anesthesia Post-op Note  Patient: Isabel Barnes  Procedure(s) Performed: Procedure(s) (LRB): CYSTOSCOPY WITH LEFT URETEROSCOPY,RETROGRADE  AND STONE EXTRACTION AND  STENT PLACEMENT (Left) HOLMIUM LASER APPLICATION (Left)  Patient Location: PACU  Anesthesia Type: General  Level of Consciousness: awake and alert   Airway and Oxygen Therapy: Patient Spontanous Breathing  Post-op Pain: mild  Post-op Assessment: Post-op Vital signs reviewed, Patient's Cardiovascular Status Stable, Respiratory Function Stable, Patent Airway and No signs of Nausea or vomiting  Last Vitals:  Filed Vitals:   02/08/13 0938  BP: 158/69  Pulse: 64  Temp: 36.1 C  Resp: 16    Post-op Vital Signs: stable   Complications: No apparent anesthesia complications

## 2013-02-08 NOTE — Anesthesia Preprocedure Evaluation (Signed)
Anesthesia Evaluation  Patient identified by MRN, date of birth, ID band Patient awake    Reviewed: Allergy & Precautions, H&P , NPO status , Patient's Chart, lab work & pertinent test results  Airway Mallampati: II TM Distance: >3 FB Neck ROM: Full    Dental no notable dental hx.    Pulmonary neg pulmonary ROS,  breath sounds clear to auscultation  Pulmonary exam normal       Cardiovascular negative cardio ROS  Rhythm:Regular Rate:Normal     Neuro/Psych negative neurological ROS  negative psych ROS   GI/Hepatic negative GI ROS, Neg liver ROS,   Endo/Other  Hypothyroidism   Renal/GU negative Renal ROS  negative genitourinary   Musculoskeletal negative musculoskeletal ROS (+)   Abdominal   Peds negative pediatric ROS (+)  Hematology negative hematology ROS (+)   Anesthesia Other Findings   Reproductive/Obstetrics negative OB ROS                           Anesthesia Physical Anesthesia Plan  ASA: II  Anesthesia Plan: General   Post-op Pain Management:    Induction: Intravenous  Airway Management Planned: LMA  Additional Equipment:   Intra-op Plan:   Post-operative Plan:   Informed Consent: I have reviewed the patients History and Physical, chart, labs and discussed the procedure including the risks, benefits and alternatives for the proposed anesthesia with the patient or authorized representative who has indicated his/her understanding and acceptance.   Dental advisory given  Plan Discussed with: CRNA and Surgeon  Anesthesia Plan Comments:         Anesthesia Quick Evaluation  

## 2013-02-08 NOTE — Transfer of Care (Signed)
Immediate Anesthesia Transfer of Care Note  Patient: Isabel Barnes  Procedure(s) Performed: Procedure(s) (LRB): CYSTOSCOPY WITH LEFT URETEROSCOPY,RETROGRADE  AND STONE EXTRACTION AND  STENT PLACEMENT (Left) HOLMIUM LASER APPLICATION (Left)  Patient Location: PACU  Anesthesia Type: General  Level of Consciousness: awake, oriented, sedated and patient cooperative  Airway & Oxygen Therapy: Patient Spontanous Breathing and Patient connected to face mask oxygen  Post-op Assessment: Report given to PACU RN and Post -op Vital signs reviewed and stable  Post vital signs: Reviewed and stable  Complications: No apparent anesthesia complications

## 2013-02-08 NOTE — Anesthesia Procedure Notes (Signed)
Procedure Name: LMA Insertion Date/Time: 02/08/2013 10:53 AM Performed by: Renella Cunas D Pre-anesthesia Checklist: Patient identified, Emergency Drugs available, Suction available and Patient being monitored Patient Re-evaluated:Patient Re-evaluated prior to inductionOxygen Delivery Method: Circle System Utilized Preoxygenation: Pre-oxygenation with 100% oxygen Intubation Type: IV induction Ventilation: Mask ventilation without difficulty LMA: LMA inserted LMA Size: 4.0 Number of attempts: 1 Airway Equipment and Method: bite block Placement Confirmation: positive ETCO2 Tube secured with: Tape Dental Injury: Teeth and Oropharynx as per pre-operative assessment

## 2013-02-08 NOTE — Interval H&P Note (Signed)
History and Physical Interval Note:  02/08/2013 10:39 AM  Isabel Barnes  has presented today for surgery, with the diagnosis of LEFT MID URETERAL STONE  The various methods of treatment have been discussed with the patient and family. After consideration of risks, benefits and other options for treatment, the patient has consented to  Procedure(s): CYSTOSCOPY WITH LEFT URETEROSCOPY AND STONE EXTRACTION AND POSSIBLE STENT PLACEMENT (Left) HOLMIUM LASER APPLICATION (Left) as a surgical intervention .  The patient's history has been reviewed, patient examined, no change in status, stable for surgery.  I have reviewed the patient's chart and labs.  Questions were answered to the patient's satisfaction.     WRENN,JOHN J

## 2013-02-09 NOTE — Op Note (Deleted)
Isabel Barnes, Isabel Barnes             ACCOUNT NO.:  [id number]  MEDICAL RECORD NO.:  [id number]  LOCATION:                                 FACILITY:  PHYSICIAN:  Excell Seltzer. Annabell Howells, M.D.    DATE OF BIRTH:  Apr 22, 1941  DATE OF PROCEDURE:  02/08/2013 DATE OF DISCHARGE:  02/08/2013                              OPERATIVE REPORT   PROCEDURES:  Cystoscopy, left retrograde pyelogram with interpretation, left ureteroscopic stone extraction with holmium lasertripsy, and placement of left double-J stent.  PREOPERATIVE DIAGNOSIS:  A 6-mm left mid ureteral stone.  POSTOPERATIVE DIAGNOSIS:  A 6-mm left mid ureteral stone.  SURGEON:  Excell Seltzer. Annabell Howells, M.D.  ANESTHESIA:  General.  SPECIMEN:  Stone fragments.  DRAINS:  A 6-French 24-cm left double-J stent.  COMPLICATIONS:  None.  INDICATIONS:  [name] is a 72 year old white female with a left 6-mm left mid ureteral stone.  It has failed to pass.  She has elected ureteroscopic stone extraction.  FINDINGS OF PROCEDURE:  She was taken to the operating room where general anesthetic was induced.  She was given 2 g of Rocephin.  She was placed in lithotomy position and fitted with PAS hose.  Her perineum and genitalia were prepped with chlorhexidine solution.  She then underwent cystoscopy with a 22-French scope and 12-degree lens.  Examination revealed a normal urethra.  The bladder wall had mild trabeculation.  No tumors, stones, or inflammation.  Ureteral orifices were unremarkable.  The left ureteral orifice was cannulated with 5-French open-end catheter and contrast was instilled.  This demonstrated a filling defect in the lower portion of the mid ureter consistent with her stone.  There was mild dilation proximal, but no other filling defects were noted.  Once the retrograde pyelogram had been performed, a guidewire was passed to the kidney.  A 12-French introducer sheath inner core was used to dilate the ureter to the level of the  stone.  A 6.4-French rigid ureteroscope was then passed alongside the wire to the level of the stone.  The stone was readily visualized, but felt to be too large to remove intact.  A 365-micron laser fiber was then passed through the scope.  The laser was set on 0.5 watts and 20 hertz and the stone was then fragmented into manageable fragments.  A Nitinol basket was then used to remove all significant fragments with a little bit of dust left behind.  At this point, the ureteroscope was removed and a 6-French 24-cm double- J stent with string was passed to left kidney under fluoroscopic guidance.  The wire was removed leaving good coil in the kidney, a good coil in the bladder.  The bladder was drained.  The stent string was left exiting the urethra and was tied close to the urethra and tied for later self removal.  The patient was then taken down from lithotomy position.  Her anesthetic was reversed.  She was moved to recovery room in stable condition.  There were no complications.  The stone fragments were given to the family to bring to the office.     Excell Seltzer. Annabell Howells, M.D.     JJW/MEDQ  D:  02/08/2013  T:  02/09/2013  Job:  [id number]

## 2013-02-09 NOTE — Op Note (Signed)
NAME:  Isabel Barnes, Isabel Barnes             ACCOUNT NO.:  [id number]  MEDICAL RECORD NO.:  [id number]  LOCATION:                                 FACILITY:  PHYSICIAN:  John J. Wrenn, M.D.    DATE OF BIRTH:  04/03/1941  DATE OF PROCEDURE:  02/08/2013 DATE OF DISCHARGE:  02/08/2013                              OPERATIVE REPORT   PROCEDURES:  Cystoscopy, left retrograde pyelogram with interpretation, left ureteroscopic stone extraction with holmium lasertripsy, and placement of left double-J stent.  PREOPERATIVE DIAGNOSIS:  A 6-mm left mid ureteral stone.  POSTOPERATIVE DIAGNOSIS:  A 6-mm left mid ureteral stone.  SURGEON:  John J. Wrenn, M.D.  ANESTHESIA:  General.  SPECIMEN:  Stone fragments.  DRAINS:  A 6-French 24-cm left double-J stent.  COMPLICATIONS:  None.  INDICATIONS:  [name] is a 71-year-old white female with a left 6-mm left mid ureteral stone.  It has failed to pass.  She has elected ureteroscopic stone extraction.  FINDINGS OF PROCEDURE:  She was taken to the operating room where general anesthetic was induced.  She was given 2 g of Rocephin.  She was placed in lithotomy position and fitted with PAS hose.  Her perineum and genitalia were prepped with chlorhexidine solution.  She then underwent cystoscopy with a 22-French scope and 12-degree lens.  Examination revealed a normal urethra.  The bladder wall had mild trabeculation.  No tumors, stones, or inflammation.  Ureteral orifices were unremarkable.  The left ureteral orifice was cannulated with 5-French open-end catheter and contrast was instilled.  This demonstrated a filling defect in the lower portion of the mid ureter consistent with her stone.  There was mild dilation proximal, but no other filling defects were noted.  Once the retrograde pyelogram had been performed, a guidewire was passed to the kidney.  A 12-French introducer sheath inner core was used to dilate the ureter to the level of the  stone.  A 6.4-French rigid ureteroscope was then passed alongside the wire to the level of the stone.  The stone was readily visualized, but felt to be too large to remove intact.  A 365-micron laser fiber was then passed through the scope.  The laser was set on 0.5 watts and 20 hertz and the stone was then fragmented into manageable fragments.  A Nitinol basket was then used to remove all significant fragments with a little bit of dust left behind.  At this point, the ureteroscope was removed and a 6-French 24-cm double- J stent with string was passed to left kidney under fluoroscopic guidance.  The wire was removed leaving good coil in the kidney, a good coil in the bladder.  The bladder was drained.  The stent string was left exiting the urethra and was tied close to the urethra and tied for later self removal.  The patient was then taken down from lithotomy position.  Her anesthetic was reversed.  She was moved to recovery room in stable condition.  There were no complications.  The stone fragments were given to the family to bring to the office.     John J. Wrenn, M.D.     JJW/MEDQ  D:  02/08/2013  T:    02/09/2013  Job:  [id number] 

## 2013-02-12 ENCOUNTER — Encounter (HOSPITAL_BASED_OUTPATIENT_CLINIC_OR_DEPARTMENT_OTHER): Payer: Self-pay | Admitting: Urology

## 2013-02-13 ENCOUNTER — Telehealth: Payer: Self-pay | Admitting: Internal Medicine

## 2013-02-13 NOTE — Telephone Encounter (Signed)
PT CALLED TO R/S APPT TO 04/01 @ 10:45. NEW CALENDAR MAILED.

## 2013-02-14 ENCOUNTER — Ambulatory Visit: Payer: Medicare Other | Admitting: Internal Medicine

## 2013-02-14 ENCOUNTER — Other Ambulatory Visit: Payer: Medicare Other | Admitting: Lab

## 2013-02-27 ENCOUNTER — Ambulatory Visit (HOSPITAL_BASED_OUTPATIENT_CLINIC_OR_DEPARTMENT_OTHER): Payer: Medicare Other | Admitting: Internal Medicine

## 2013-02-27 ENCOUNTER — Other Ambulatory Visit (HOSPITAL_BASED_OUTPATIENT_CLINIC_OR_DEPARTMENT_OTHER): Payer: Medicare Other | Admitting: Lab

## 2013-02-27 ENCOUNTER — Encounter: Payer: Self-pay | Admitting: Internal Medicine

## 2013-02-27 ENCOUNTER — Telehealth: Payer: Self-pay | Admitting: Internal Medicine

## 2013-02-27 VITALS — BP 140/75 | HR 67 | Temp 97.0°F | Resp 20 | Ht 62.5 in | Wt 131.3 lb

## 2013-02-27 DIAGNOSIS — Z862 Personal history of diseases of the blood and blood-forming organs and certain disorders involving the immune mechanism: Secondary | ICD-10-CM

## 2013-02-27 DIAGNOSIS — D509 Iron deficiency anemia, unspecified: Secondary | ICD-10-CM

## 2013-02-27 DIAGNOSIS — D649 Anemia, unspecified: Secondary | ICD-10-CM

## 2013-02-27 LAB — CBC WITH DIFFERENTIAL/PLATELET
Basophils Absolute: 0.1 10*3/uL (ref 0.0–0.1)
EOS%: 8.8 % — ABNORMAL HIGH (ref 0.0–7.0)
HCT: 36.5 % (ref 34.8–46.6)
HGB: 12 g/dL (ref 11.6–15.9)
LYMPH%: 28.8 % (ref 14.0–49.7)
MCH: 29.1 pg (ref 25.1–34.0)
MCV: 88.6 fL (ref 79.5–101.0)
MONO%: 10.1 % (ref 0.0–14.0)
NEUT%: 51 % (ref 38.4–76.8)
Platelets: 263 10*3/uL (ref 145–400)

## 2013-02-27 LAB — IRON AND TIBC: Iron: 69 ug/dL (ref 42–145)

## 2013-02-27 NOTE — Telephone Encounter (Signed)
Gave pt appt for lab and MD for July 2014 °

## 2013-02-27 NOTE — Patient Instructions (Signed)
Your CBC and iron study are good. Continue on observation with repeat CBC and iron study in 3 months.

## 2013-02-27 NOTE — Progress Notes (Signed)
[facility] [phone]   [phone]  OFFICE PROGRESS NOTE  STEVENSON, AMY, DO [address] [place] [address]  DIAGNOSIS: Iron deficiency anemia with intolerable oral iron intake   PRIOR THERAPY: Status post Feraheme infusion x2 doses. Last dose was given 10/01/2010   CURRENT THERAPY: None.  INTERVAL HISTORY: Isabel Barnes 72 y.o. female returns to the clinic today for routine three-month follow up visit. The patient is feeling fine today with no specific complaints. She denied having any significant fatigue or weakness. She denied having any dizzy spells. She has no bleeding issues. The patient denied having any significant weight loss or night sweats. She has no chest pain, shortness breath, cough or hemoptysis. She had repeat CBC and iron study performed earlier today and she is here for evaluation and discussion of her lab results.  MEDICAL HISTORY: Past Medical History  Diagnosis Date  . Hypothyroidism   . Chronic kidney disease     hx of kidney stones  . Chronic pain disorder   . Osteoarthritis     lumbar,cervical,joints  . Spondylitis   . Complication of anesthesia     severe claustrophobia    ALLERGIES:  is allergic to dilaudid; singulair; banana; ciprofloxacin hcl; codeine; methadone; metronidazole; nucynta; oysters; penicillins; sulfur; betadine; and latex.  MEDICATIONS:  Current Outpatient Prescriptions  Medication Sig Dispense Refill  . albuterol (PROAIR HFA) 108 (90 BASE) MCG/ACT inhaler Inhale 2 puffs into the lungs as needed.       Marland Kitchen aspirin 81 MG tablet Take 81 mg by mouth daily.      . Biotin 10 MG CAPS Take 10 mg by mouth daily.      . calcium-vitamin D (OSCAL WITH D) 500-200 MG-UNIT per tablet Take 1 tablet by mouth daily.      . celecoxib (CELEBREX) 200 MG capsule Take 200 mg by mouth 2 (two) times daily.      . cholecalciferol (VITAMIN D) 1000 UNITS tablet Take 2,000 Units by mouth daily.      .  Cinnamon 500 MG capsule Take 500 mg by mouth daily.      . diclofenac sodium (VOLTAREN) 1 % GEL Apply topically as needed.      . diphenhydrAMINE (SOMINEX) 25 MG tablet Take 25 mg by mouth. As needed      . estradiol (ESTRACE) 0.5 MG tablet Take 0.5 mg by mouth daily.      . fish oil-omega-3 fatty acids 1000 MG capsule Take 2 g by mouth daily.      . Flaxseed, Linseed, 1200 MG CAPS Take 1,200 mg by mouth. Take 2 tablets daily      . furosemide (LASIX) 20 MG tablet Take 20 mg by mouth as needed.      Marland Kitchen HYDROcodone-acetaminophen (NORCO) 10-325 MG per tablet Take 1 tablet by mouth every 4 (four) hours as needed for pain.  20 tablet  0  . levothyroxine (SYNTHROID, LEVOTHROID) 50 MCG tablet Take 50 mcg by mouth daily.      . magnesium 30 MG tablet Take 30 mg by mouth 2 (two) times daily.      . Multiple Vitamins-Minerals (MULTIVITAMIN WITH MINERALS) tablet Take 1 tablet by mouth daily.      . predniSONE (STERAPRED UNI-PAK) 10 MG tablet Take 10 mg by mouth daily.      . prochlorperazine (COMPAZINE) 10 MG tablet Take 10 mg by mouth every 8 (eight) hours as needed.      Marland Kitchen  Red Yeast Rice 600 MG CAPS Take 600 mg by mouth daily.      . Turmeric 500 MG CAPS Take 500 mg by mouth daily.      . vitamin B-12 (CYANOCOBALAMIN) 250 MCG tablet Take 250 mcg by mouth daily.       No current facility-administered medications for this visit.    SURGICAL HISTORY:  Past Surgical History  Procedure Laterality Date  . Abdominal hysterectomy    . Cholecystectomy    . Joint replacement Right 2012    shoulder  . Shoulder arthroscopy w/ rotator cuff repair Bilateral three times each over several yrs  . Cystoscopy w/ ureteroscopy  2012  . Thumb arthroscopy    . Nasal sinus surgery    . Anterior fusion cervical spine      x2 -C4-7  . Eye surgery Bilateral     cataract /lens implant  . Holmium laser application Left 02/08/2013    Procedure: HOLMIUM LASER APPLICATION;  Surgeon: Anner Crete, MD;  Location: [facility];  Service: Urology;  Laterality: Left;    REVIEW OF SYSTEMS:  A comprehensive review of systems was negative.   PHYSICAL EXAMINATION: General appearance: alert, cooperative and no distress Head: Normocephalic, without obvious abnormality, atraumatic Neck: no adenopathy Resp: clear to auscultation bilaterally Cardio: regular rate and rhythm, S1, S2 normal, no murmur, click, rub or gallop GI: soft, non-tender; bowel sounds normal; no masses,  no organomegaly Extremities: extremities normal, atraumatic, no cyanosis or edema  ECOG PERFORMANCE STATUS: 0 - Asymptomatic  Blood pressure 140/75, pulse 67, temperature 97 F (36.1 C), temperature source Oral, resp. rate 20, height 5' 2.5" (1.588 m), weight 131 lb 4.8 oz (59.557 kg).  LABORATORY DATA: Lab Results  Component Value Date   WBC 4.7 02/27/2013   HGB 12.0 02/27/2013   HCT 36.5 02/27/2013   MCV 88.6 02/27/2013   PLT 263 02/27/2013      Chemistry      Component Value Date/Time   NA 142 02/08/2013 1033   K 3.5 02/08/2013 1033   CL 103 08/01/2011 1236   CO2 30 08/01/2011 1236   BUN 10 08/01/2011 1236   CREATININE 0.54 08/01/2011 1236      Component Value Date/Time   CALCIUM 9.0 08/01/2011 1236   ALKPHOS 183* 08/01/2011 1236   AST 36 08/01/2011 1236   ALT 87* 08/01/2011 1236   BILITOT 0.4 08/01/2011 1236       RADIOGRAPHIC STUDIES: No results found.  ASSESSMENT: this is a very pleasant 72 years old white female with history of iron deficiency anemia with intolerable oral iron intake status post Feraheme infusion last one was given 10/01/2006 and has been on observation since that time with no significant evidence for anemia.  PLAN: the patient is doing fine and currently asymptomatic. Her hemoglobin and hematocrit are normal as well as the iron study and ferritin. I discussed the lab result with the patient. I recommended for her to continue on observation with repeat CBC and iron study in 3 months. She was advised to call  immediately if she has any concerning symptoms in the interval.  All questions were answered. The patient knows to call the clinic with any problems, questions or concerns. We can certainly see the patient much sooner if necessary.

## 2013-05-29 ENCOUNTER — Other Ambulatory Visit (HOSPITAL_BASED_OUTPATIENT_CLINIC_OR_DEPARTMENT_OTHER): Payer: Medicare Other | Admitting: Lab

## 2013-05-29 ENCOUNTER — Ambulatory Visit: Payer: Medicare Other | Admitting: Internal Medicine

## 2013-05-29 DIAGNOSIS — D509 Iron deficiency anemia, unspecified: Secondary | ICD-10-CM

## 2013-05-29 LAB — CBC WITH DIFFERENTIAL/PLATELET
EOS%: 16.1 % — ABNORMAL HIGH (ref 0.0–7.0)
Eosinophils Absolute: 1 10*3/uL — ABNORMAL HIGH (ref 0.0–0.5)
LYMPH%: 24.4 % (ref 14.0–49.7)
MCH: 29.1 pg (ref 25.1–34.0)
MCHC: 33.9 g/dL (ref 31.5–36.0)
MCV: 86 fL (ref 79.5–101.0)
MONO%: 7.3 % (ref 0.0–14.0)
NEUT#: 3.2 10*3/uL (ref 1.5–6.5)
Platelets: 237 10*3/uL (ref 145–400)
RBC: 4.54 10*6/uL (ref 3.70–5.45)

## 2013-05-29 LAB — FERRITIN CHCC: Ferritin: 32 ng/ml (ref 9–269)

## 2013-05-29 LAB — IRON AND TIBC CHCC: %SAT: 38 % (ref 21–57)

## 2013-06-05 ENCOUNTER — Ambulatory Visit (HOSPITAL_BASED_OUTPATIENT_CLINIC_OR_DEPARTMENT_OTHER): Payer: Medicare Other | Admitting: Internal Medicine

## 2013-06-05 ENCOUNTER — Telehealth: Payer: Self-pay | Admitting: Internal Medicine

## 2013-06-05 ENCOUNTER — Encounter: Payer: Self-pay | Admitting: Internal Medicine

## 2013-06-05 VITALS — BP 139/61 | HR 63 | Temp 98.0°F | Resp 18 | Ht 62.0 in | Wt 134.1 lb

## 2013-06-05 DIAGNOSIS — D509 Iron deficiency anemia, unspecified: Secondary | ICD-10-CM

## 2013-06-05 NOTE — Progress Notes (Signed)
[facility] Telephone:(336) [phone]   [phone]  OFFICE PROGRESS NOTE  STEVENSON, AMY, DO [address] [place] [address]  DIAGNOSIS: Iron deficiency anemia with intolerable oral iron intake   PRIOR THERAPY: Status post Feraheme infusion x2 doses. Last dose was given 09/26/2012   CURRENT THERAPY: None.  INTERVAL HISTORY: Isabel Barnes 72 y.o. female returns to the clinic today for followup visit. The patient is feeling fine today with no specific complaints except for arthralgia. She denied having any significant fatigue or weakness. She denied having any significant disease pelvis. The patient denied having any bleeding issues. She has no chest pain, shortness of breath, cough or hemoptysis. She denied having any significant weight loss or night sweats. The patient had repeat CBC and iron study performed recently and she is here for evaluation and discussion of her lab results.  MEDICAL HISTORY: Past Medical History  Diagnosis Date  . Hypothyroidism   . Chronic kidney disease     hx of kidney stones  . Chronic pain disorder   . Osteoarthritis     lumbar,cervical,joints  . Spondylitis   . Complication of anesthesia     severe claustrophobia    ALLERGIES:  is allergic to dilaudid; gabapentin; lyrica; singulair; banana; ciprofloxacin hcl; codeine; methadone; metronidazole; nucynta; oysters; penicillins; sulfur; betadine; and latex.  MEDICATIONS:  Current Outpatient Prescriptions  Medication Sig Dispense Refill  . albuterol (PROAIR HFA) 108 (90 BASE) MCG/ACT inhaler Inhale 2 puffs into the lungs as needed.       Marland Kitchen aspirin 81 MG tablet Take 81 mg by mouth daily.      . Biotin 10 MG CAPS Take 10 mg by mouth daily.      . calcium-vitamin D (OSCAL WITH D) 500-200 MG-UNIT per tablet Take 1 tablet by mouth daily.      . cholecalciferol (VITAMIN D) 1000 UNITS tablet Take 2,000 Units by mouth daily.      . Cinnamon 500 MG capsule Take 500  mg by mouth daily.      . diclofenac sodium (VOLTAREN) 1 % GEL Apply topically as needed.      . diphenhydrAMINE (SOMINEX) 25 MG tablet Take 25 mg by mouth. As needed      . estradiol (ESTRACE) 0.5 MG tablet Take 0.5 mg by mouth daily.      . fish oil-omega-3 fatty acids 1000 MG capsule Take 2 g by mouth daily.      . Flaxseed, Linseed, 1200 MG CAPS Take 1,200 mg by mouth. Take 2 tablets daily      . furosemide (LASIX) 20 MG tablet Take 20 mg by mouth as needed.      Marland Kitchen HYDROcodone-acetaminophen (NORCO) 10-325 MG per tablet Take 1 tablet by mouth every 4 (four) hours as needed for pain.  20 tablet  0  . levothyroxine (SYNTHROID, LEVOTHROID) 50 MCG tablet Take 50 mcg by mouth daily.      . magnesium 30 MG tablet Take 30 mg by mouth 2 (two) times daily.      . Multiple Vitamins-Minerals (MULTIVITAMIN WITH MINERALS) tablet Take 1 tablet by mouth daily.      . Red Yeast Rice 600 MG CAPS Take 600 mg by mouth daily.      . Turmeric 500 MG CAPS Take 500 mg by mouth daily.      . vitamin B-12 (CYANOCOBALAMIN) 250 MCG tablet Take 250 mcg by mouth daily.      Marland Kitchen  prochlorperazine (COMPAZINE) 10 MG tablet Take 10 mg by mouth every 8 (eight) hours as needed.       No current facility-administered medications for this visit.    SURGICAL HISTORY:  Past Surgical History  Procedure Laterality Date  . Abdominal hysterectomy    . Cholecystectomy    . Joint replacement Right 2012    shoulder  . Shoulder arthroscopy w/ rotator cuff repair Bilateral three times each over several yrs  . Cystoscopy w/ ureteroscopy  2012  . Thumb arthroscopy    . Nasal sinus surgery    . Anterior fusion cervical spine      x2 -C4-7  . Eye surgery Bilateral     cataract /lens implant  . Holmium laser application Left 02/08/2013    Procedure: HOLMIUM LASER APPLICATION;  Surgeon: Anner Crete, MD;  Location: [facility];  Service: Urology;  Laterality: Left;    REVIEW OF SYSTEMS:  A comprehensive review of  systems was negative.   PHYSICAL EXAMINATION: General appearance: alert, cooperative and no distress Head: Normocephalic, without obvious abnormality, atraumatic Neck: no adenopathy Lymph nodes: Cervical, supraclavicular, and axillary nodes normal. Resp: clear to auscultation bilaterally Cardio: regular rate and rhythm, S1, S2 normal, no murmur, click, rub or gallop GI: soft, non-tender; bowel sounds normal; no masses,  no organomegaly Extremities: extremities normal, atraumatic, no cyanosis or edema  ECOG PERFORMANCE STATUS: 0 - Asymptomatic  Blood pressure 139/61, pulse 63, temperature 98 F (36.7 C), temperature source Oral, resp. rate 18, height 5\' 2"  (1.575 m), weight 134 lb 1.6 oz (60.827 kg), SpO2 98.00%.  LABORATORY DATA: Lab Results  Component Value Date   WBC 6.4 05/29/2013   HGB 13.2 05/29/2013   HCT 39.0 05/29/2013   MCV 86.0 05/29/2013   PLT 237 05/29/2013      Chemistry      Component Value Date/Time   NA 142 02/08/2013 1033   K 3.5 02/08/2013 1033   CL 103 08/01/2011 1236   CO2 30 08/01/2011 1236   BUN 10 08/01/2011 1236   CREATININE 0.54 08/01/2011 1236      Component Value Date/Time   CALCIUM 9.0 08/01/2011 1236   ALKPHOS 183* 08/01/2011 1236   AST 36 08/01/2011 1236   ALT 87* 08/01/2011 1236   BILITOT 0.4 08/01/2011 1236       RADIOGRAPHIC STUDIES: No results found.  ASSESSMENT AND PLAN: This is a very pleasant 72 years old white female with iron deficiency anemia status post Feraheme infusion last dose was given on 09/26/2012 and she has been observation since that time with no significant evidence of iron deficiency anemia. I discussed the lab result with the patient today. I recommended for her to continue on observation for now with repeat CBC and iron study in 3 months. She was advised to call immediately if she has any concerning symptoms in the interval. All questions were answered. The patient knows to call the clinic with any problems, questions or concerns. We can  certainly see the patient much sooner if necessary.

## 2013-06-05 NOTE — Patient Instructions (Signed)
No significant evidence of anemia on the recent blood work. Followup visit in 3 months with repeat CBC and iron study.

## 2013-06-05 NOTE — Telephone Encounter (Signed)
gave pt appt lab before MD visit on october 2014

## 2013-06-19 ENCOUNTER — Encounter (HOSPITAL_COMMUNITY): Payer: Self-pay | Admitting: *Deleted

## 2013-06-19 ENCOUNTER — Emergency Department (HOSPITAL_COMMUNITY)
Admission: EM | Admit: 2013-06-19 | Discharge: 2013-06-19 | Disposition: A | Discharge status: Home / Self Care | Payer: Medicare Other | Attending: Emergency Medicine | Admitting: Emergency Medicine

## 2013-06-19 ENCOUNTER — Emergency Department (HOSPITAL_COMMUNITY): Payer: Medicare Other

## 2013-06-19 DIAGNOSIS — M47817 Spondylosis without myelopathy or radiculopathy, lumbosacral region: Secondary | ICD-10-CM | POA: Insufficient documentation

## 2013-06-19 DIAGNOSIS — I129 Hypertensive chronic kidney disease with stage 1 through stage 4 chronic kidney disease, or unspecified chronic kidney disease: Secondary | ICD-10-CM | POA: Insufficient documentation

## 2013-06-19 DIAGNOSIS — Z87442 Personal history of urinary calculi: Secondary | ICD-10-CM | POA: Insufficient documentation

## 2013-06-19 DIAGNOSIS — Z87891 Personal history of nicotine dependence: Secondary | ICD-10-CM | POA: Insufficient documentation

## 2013-06-19 DIAGNOSIS — R42 Dizziness and giddiness: Secondary | ICD-10-CM

## 2013-06-19 DIAGNOSIS — Z91040 Latex allergy status: Secondary | ICD-10-CM | POA: Insufficient documentation

## 2013-06-19 DIAGNOSIS — Z7982 Long term (current) use of aspirin: Secondary | ICD-10-CM | POA: Insufficient documentation

## 2013-06-19 DIAGNOSIS — Z880 Allergy status to penicillin: Secondary | ICD-10-CM | POA: Insufficient documentation

## 2013-06-19 DIAGNOSIS — Z8739 Personal history of other diseases of the musculoskeletal system and connective tissue: Secondary | ICD-10-CM | POA: Insufficient documentation

## 2013-06-19 DIAGNOSIS — R110 Nausea: Secondary | ICD-10-CM | POA: Insufficient documentation

## 2013-06-19 DIAGNOSIS — Z79899 Other long term (current) drug therapy: Secondary | ICD-10-CM | POA: Insufficient documentation

## 2013-06-19 DIAGNOSIS — M47812 Spondylosis without myelopathy or radiculopathy, cervical region: Secondary | ICD-10-CM | POA: Insufficient documentation

## 2013-06-19 DIAGNOSIS — N189 Chronic kidney disease, unspecified: Secondary | ICD-10-CM | POA: Insufficient documentation

## 2013-06-19 DIAGNOSIS — I10 Essential (primary) hypertension: Secondary | ICD-10-CM

## 2013-06-19 DIAGNOSIS — E039 Hypothyroidism, unspecified: Secondary | ICD-10-CM | POA: Insufficient documentation

## 2013-06-19 DIAGNOSIS — G8929 Other chronic pain: Secondary | ICD-10-CM | POA: Insufficient documentation

## 2013-06-19 LAB — CBC WITH DIFFERENTIAL/PLATELET
Lymphocytes Relative: 13 % (ref 12–46)
Lymphs Abs: 1.4 10*3/uL (ref 0.7–4.0)
Neutrophils Relative %: 71 % (ref 43–77)
Platelets: 265 10*3/uL (ref 150–400)
RBC: 4.86 MIL/uL (ref 3.87–5.11)
WBC: 10.6 10*3/uL — ABNORMAL HIGH (ref 4.0–10.5)

## 2013-06-19 LAB — COMPREHENSIVE METABOLIC PANEL
ALT: 23 U/L (ref 0–35)
Alkaline Phosphatase: 60 U/L (ref 39–117)
CO2: 29 mEq/L (ref 19–32)
Chloride: 100 mEq/L (ref 96–112)
GFR calc Af Amer: >90 mL/min (ref >90)
GFR calc non Af Amer: 89 mL/min — ABNORMAL LOW (ref >90)
Glucose, Bld: 103 mg/dL — ABNORMAL HIGH (ref 70–99)
Potassium: 4.1 mEq/L (ref 3.5–5.1)
Sodium: 137 mEq/L (ref 135–145)
Total Protein: 6.4 g/dL (ref 6.0–8.3)

## 2013-06-19 MED ORDER — LISINOPRIL 5 MG PO TABS: 5.0000 mg | ORAL_TABLET | Freq: Every day | ORAL | Status: DC

## 2013-06-19 MED ORDER — SODIUM CHLORIDE 0.9 % IV SOLN
INTRAVENOUS | Status: DC
  Administered 2013-06-19: 12:00:00 via INTRAVENOUS

## 2013-06-19 MED ORDER — MECLIZINE HCL 25 MG PO TABS
25.0000 mg | ORAL_TABLET | Freq: Once | ORAL | Status: AC
  Administered 2013-06-19: 25 mg via ORAL
  Filled 2013-06-19: qty 1

## 2013-06-19 MED ORDER — MECLIZINE HCL 25 MG PO TABS: 25.0000 mg | ORAL_TABLET | Freq: Four times a day (QID) | ORAL | Status: DC

## 2013-06-19 NOTE — ED Provider Notes (Signed)
History    CSN: [id number] Arrival date & time 06/19/13  1106  First MD Initiated Contact with Patient 06/19/13 1131     Chief Complaint  Patient presents with  . Dizziness  . Nausea   (Consider location/radiation/quality/duration/timing/severity/associated sxs/prior Treatment) The history is provided by the patient.   patient here complaining of dizziness it started yesterday described as room spinning. Thought that might be from her high blood pressure. Denies any head pressure. Symptoms are worse when she makes certain movements. Denies any ear pain or recent cold symptoms. No associated chest chest pressure. No dyspnea. No abdominal pain. Some near syncope without syncope. Symptoms have been persistent andprior to arrival. No prior history of same Past Medical History  Diagnosis Date  . Hypothyroidism   . Chronic kidney disease     hx of kidney stones  . Chronic pain disorder   . Osteoarthritis     lumbar,cervical,joints  . Spondylitis   . Complication of anesthesia     severe claustrophobia   Past Surgical History  Procedure Laterality Date  . Abdominal hysterectomy    . Cholecystectomy    . Joint replacement Right 2012    shoulder  . Shoulder arthroscopy w/ rotator cuff repair Bilateral three times each over several yrs  . Cystoscopy w/ ureteroscopy  2012  . Thumb arthroscopy    . Nasal sinus surgery    . Anterior fusion cervical spine      x2 -C4-7  . Eye surgery Bilateral     cataract /lens implant  . Holmium laser application Left 02/08/2013    Procedure: HOLMIUM LASER APPLICATION;  Surgeon: Anner Crete, MD;  Location: [facility];  Service: Urology;  Laterality: Left;   History reviewed. No pertinent family history. History  Substance Use Topics  . Smoking status: Former Smoker    Quit date: 02/07/1992  . Smokeless tobacco: Not on file  . Alcohol Use: No   OB History   Grav Para Term Preterm Abortions TAB SAB Ect Mult Living                  Review of Systems  All other systems reviewed and are negative.    Allergies  Dilaudid; Gabapentin; Lyrica; Singulair; Banana; Ciprofloxacin hcl; Codeine; Methadone; Metronidazole; Nucynta; Oysters; Penicillins; Sulfur; Betadine; and Latex  Home Medications   Current Outpatient Rx  Name  Route  Sig  Dispense  Refill  . albuterol (PROAIR HFA) 108 (90 BASE) MCG/ACT inhaler   Inhalation   Inhale 2 puffs into the lungs every 4 (four) hours as needed for shortness of breath.          Marland Kitchen aspirin EC 81 MG tablet   Oral   Take 81 mg by mouth every morning.         . Biotin 10 MG CAPS   Oral   Take 10 mg by mouth 2 (two) times daily.          . calcium-vitamin D (OSCAL WITH D) 500-200 MG-UNIT per tablet   Oral   Take 1 tablet by mouth every morning.          . cholecalciferol (VITAMIN D) 1000 UNITS tablet   Oral   Take 1,000 Units by mouth 2 (two) times daily.          . diclofenac sodium (VOLTAREN) 1 % GEL   Topical   Apply 2 g topically 4 (four) times daily as needed (For pain.).          Marland Kitchen  diphenhydrAMINE (BENADRYL) 25 MG tablet   Oral   Take 50 mg by mouth every 6 (six) hours as needed for allergies.         Marland Kitchen aspirin 81 MG tablet   Oral   Take 81 mg by mouth daily.         . diphenhydrAMINE (SOMINEX) 25 MG tablet   Oral   Take 25 mg by mouth at bedtime as needed. As needed         . estradiol (ESTRACE) 0.5 MG tablet   Oral   Take 0.5 mg by mouth daily.         . fish oil-omega-3 fatty acids 1000 MG capsule   Oral   Take 2 g by mouth daily.         . Flaxseed, Linseed, 1200 MG CAPS   Oral   Take 1,200 mg by mouth. Take 2 tablets daily         . furosemide (LASIX) 20 MG tablet   Oral   Take 20 mg by mouth as needed.         Marland Kitchen HYDROcodone-acetaminophen (NORCO) 10-325 MG per tablet   Oral   Take 1 tablet by mouth every 4 (four) hours as needed for pain.   20 tablet   0   . levothyroxine (SYNTHROID, LEVOTHROID) 50 MCG  tablet   Oral   Take 50 mcg by mouth daily.         . magnesium 30 MG tablet   Oral   Take 30 mg by mouth 2 (two) times daily.         . Multiple Vitamins-Minerals (MULTIVITAMIN WITH MINERALS) tablet   Oral   Take 1 tablet by mouth daily.         . prochlorperazine (COMPAZINE) 10 MG tablet   Oral   Take 10 mg by mouth every 8 (eight) hours as needed.         . Red Yeast Rice 600 MG CAPS   Oral   Take 600 mg by mouth daily.         . Turmeric 500 MG CAPS   Oral   Take 500 mg by mouth daily.         . vitamin B-12 (CYANOCOBALAMIN) 250 MCG tablet   Oral   Take 250 mcg by mouth daily.          BP 189/71  Pulse 78  Temp(Src) 98.2 F (36.8 C) (Oral)  Resp 16  SpO2 98% Physical Exam  Nursing note and vitals reviewed. Constitutional: She is oriented to person, place, and time. She appears well-developed and well-nourished.  Non-toxic appearance. No distress.  HENT:  Head: Normocephalic and atraumatic.  Eyes: Conjunctivae, EOM and lids are normal. Pupils are equal, round, and reactive to light.  Neck: Normal range of motion. Neck supple. No tracheal deviation present. No mass present.  Cardiovascular: Normal rate, regular rhythm and normal heart sounds.  Exam reveals no gallop.   No murmur heard. Pulmonary/Chest: Effort normal and breath sounds normal. No stridor. No respiratory distress. She has no decreased breath sounds. She has no wheezes. She has no rhonchi. She has no rales.  Abdominal: Soft. Normal appearance and bowel sounds are normal. She exhibits no distension. There is no tenderness. There is no rebound and no CVA tenderness.  Musculoskeletal: Normal range of motion. She exhibits no edema and no tenderness.  Neurological: She is alert and oriented to person, place, and time. She has normal strength.  No cranial nerve deficit or sensory deficit. Coordination and gait normal. GCS eye subscore is 4. GCS verbal subscore is 5. GCS motor subscore is 6.   Horizontal nystagmus noted  Skin: Skin is warm and dry. No abrasion and no rash noted.  Psychiatric: She has a normal mood and affect. Her speech is normal and behavior is normal.    ED Course  Procedures (including critical care time) Labs Reviewed - No data to display No results found. No diagnosis found.  MDM  Patient given Antivert though slightly better. She did have some horizontal nystagmus on her neurologic exam likely from BPV. Patient also is hypertensive and her blood pressure has decreased spontaneously. Reexam shows that she is able to walk better at this time. Doubt that she has central vertigo. Lab work and head CT are negative. Patient cannot have an MRI due to metal in her neck. Spoke with the patient's family care physician, Dr. Lendon Colonel, states that the patient's blood pressure is elevated in the office back on June 26. Patient to be placed on lisinopril 5 mg a day and I will also place her on Antivert and she will see Dr. Lendon Colonel this week  Toy Baker, MD 06/19/13 1400

## 2013-06-19 NOTE — ED Notes (Addendum)
Pt reports she is treated for pain management, took a hydrocodone this am. Denies pain. Pt reports dizziness/nausea that started this morning around 0730 after she got up. Pt denies falls or hitting her head recently. Pt denies HTN but with 2 readings BP 198/75 and 189/71  Pt reports headaches, pcp finished 1 week ago a 5 day dose of prednisone.

## 2013-08-31 ENCOUNTER — Other Ambulatory Visit (HOSPITAL_BASED_OUTPATIENT_CLINIC_OR_DEPARTMENT_OTHER): Payer: Medicare Other | Admitting: Lab

## 2013-08-31 DIAGNOSIS — D509 Iron deficiency anemia, unspecified: Secondary | ICD-10-CM

## 2013-08-31 LAB — CBC WITH DIFFERENTIAL/PLATELET
BASO%: 2.2 % — ABNORMAL HIGH (ref 0.0–2.0)
EOS%: 27.6 % — ABNORMAL HIGH (ref 0.0–7.0)
HCT: 39.3 % (ref 34.8–46.6)
MCH: 29.1 pg (ref 25.1–34.0)
MCHC: 33.3 g/dL (ref 31.5–36.0)
NEUT%: 39.9 % (ref 38.4–76.8)
lymph#: 1.6 10*3/uL (ref 0.9–3.3)

## 2013-08-31 LAB — IRON AND TIBC CHCC: TIBC: 270 ug/dL (ref 236–444)

## 2013-09-05 ENCOUNTER — Telehealth: Payer: Self-pay | Admitting: Internal Medicine

## 2013-09-05 ENCOUNTER — Ambulatory Visit (HOSPITAL_BASED_OUTPATIENT_CLINIC_OR_DEPARTMENT_OTHER): Payer: Medicare Other | Admitting: Internal Medicine

## 2013-09-05 ENCOUNTER — Encounter: Payer: Self-pay | Admitting: Internal Medicine

## 2013-09-05 VITALS — BP 131/65 | HR 65 | Temp 98.1°F | Resp 18 | Ht 62.0 in | Wt 134.3 lb

## 2013-09-05 DIAGNOSIS — D509 Iron deficiency anemia, unspecified: Secondary | ICD-10-CM

## 2013-09-05 DIAGNOSIS — N189 Chronic kidney disease, unspecified: Secondary | ICD-10-CM

## 2013-09-05 NOTE — Patient Instructions (Signed)
Continue on observation with repeat CBC, iron study and ferritin in six-months.

## 2013-09-05 NOTE — Progress Notes (Signed)
[facility] [phone]   [phone]  OFFICE PROGRESS NOTE  Toniann Fail, MD [address] [place] [address]  DIAGNOSIS: Iron deficiency anemia with intolerable oral iron intake   PRIOR THERAPY: Status post Feraheme infusion x2 doses. Last dose was given 09/26/2012   CURRENT THERAPY: None.  INTERVAL HISTORY: Isabel Barnes 72 y.o. female returns to the clinic today for three-month followup visit. The patient is feeling fine today with no specific complaints. She denied having any significant fatigue or weakness. She denied having any dizzy spells. The patient has no bleeding, bruises or ecchymosis. She denied having any significant chest pain, shortness of breath, cough or hemoptysis. She had repeat CBC and iron study performed recently and she is here for evaluation and discussion of her lab results. Her iron study showed normal serum iron 57, total iron binding capacity 270, iron saturation 21% and ferritin 43.  MEDICAL HISTORY: Past Medical History  Diagnosis Date  . Hypothyroidism   . Chronic kidney disease     hx of kidney stones  . Chronic pain disorder   . Osteoarthritis     lumbar,cervical,joints  . Spondylitis   . Complication of anesthesia     severe claustrophobia    ALLERGIES:  is allergic to dilaudid; gabapentin; lyrica; singulair; banana; ciprofloxacin hcl; codeine; methadone; metronidazole; nucynta; oysters; penicillins; sulfur; betadine; and latex.  MEDICATIONS:  Current Outpatient Prescriptions  Medication Sig Dispense Refill  . albuterol (PROAIR HFA) 108 (90 BASE) MCG/ACT inhaler Inhale 2 puffs into the lungs every 4 (four) hours as needed for shortness of breath.       Marland Kitchen aspirin EC 81 MG tablet Take 81 mg by mouth every morning.      . Biotin 10 MG CAPS Take 10 mg by mouth 2 (two) times daily.       . calcium-vitamin D (OSCAL WITH D) 500-200 MG-UNIT per tablet Take 1 tablet by mouth every morning.        . cholecalciferol (VITAMIN D) 1000 UNITS tablet Take 1,000 Units by mouth 2 (two) times daily.       . diclofenac sodium (VOLTAREN) 1 % GEL Apply 2 g topically 4 (four) times daily as needed (For pain.).       Marland Kitchen diphenhydrAMINE (BENADRYL) 25 MG tablet Take 50 mg by mouth every 6 (six) hours as needed for allergies.      Marland Kitchen estradiol (ESTRACE) 0.5 MG tablet Take 0.5 mg by mouth every morning.       . fish oil-omega-3 fatty acids 1000 MG capsule Take 1,000 mg by mouth every morning.       Marland Kitchen FLUZONE HIGH-DOSE injection       . HYDROcodone-acetaminophen (NORCO) 10-325 MG per tablet Take 1 tablet by mouth every 4 (four) hours as needed for pain.  20 tablet  0  . hydroxychloroquine (PLAQUENIL) 200 MG tablet Take 200 mg by mouth daily.      Marland Kitchen levothyroxine (SYNTHROID, LEVOTHROID) 50 MCG tablet Take 50 mcg by mouth every morning.       Marland Kitchen lisinopril (PRINIVIL,ZESTRIL) 5 MG tablet Take 1 tablet (5 mg total) by mouth daily.  30 tablet  0  . losartan (COZAAR) 25 MG tablet Take 25 mg by mouth daily. 1/2 tab to 1 tablet daily      . magnesium 30 MG tablet Take 30 mg by mouth 2 (two) times daily.      . Multiple Vitamins-Minerals (MULTIVITAMIN  WITH MINERALS) tablet Take 1 tablet by mouth every morning.       . prochlorperazine (COMPAZINE) 10 MG tablet Take 10 mg by mouth every 8 (eight) hours as needed (For nausea.).       Marland Kitchen Red Yeast Rice 600 MG CAPS Take 600 mg by mouth daily after supper.       . tetrahydrozoline 0.05 % ophthalmic solution Place 1 drop into both eyes every morning.      . Turmeric 500 MG CAPS Take 500 mg by mouth 2 (two) times daily.       . vitamin B-12 (CYANOCOBALAMIN) 250 MCG tablet Take 250 mcg by mouth daily after supper.        No current facility-administered medications for this visit.    SURGICAL HISTORY:  Past Surgical History  Procedure Laterality Date  . Abdominal hysterectomy    . Cholecystectomy    . Joint replacement Right 2012    shoulder  . Shoulder arthroscopy  w/ rotator cuff repair Bilateral three times each over several yrs  . Cystoscopy w/ ureteroscopy  2012  . Thumb arthroscopy    . Nasal sinus surgery    . Anterior fusion cervical spine      x2 -C4-7  . Eye surgery Bilateral     cataract /lens implant  . Holmium laser application Left 02/08/2013    Procedure: HOLMIUM LASER APPLICATION;  Surgeon: Anner Crete, MD;  Location: [facility];  Service: Urology;  Laterality: Left;    REVIEW OF SYSTEMS:  A comprehensive review of systems was negative except for: Musculoskeletal: positive for arthralgias   PHYSICAL EXAMINATION: General appearance: alert, cooperative and no distress Head: Normocephalic, without obvious abnormality, atraumatic Neck: no adenopathy, no JVD, supple, symmetrical, trachea midline and thyroid not enlarged, symmetric, no tenderness/mass/nodules Lymph nodes: Cervical, supraclavicular, and axillary nodes normal. Resp: clear to auscultation bilaterally Back: symmetric, no curvature. ROM normal. No CVA tenderness. Cardio: regular rate and rhythm, S1, S2 normal, no murmur, click, rub or gallop GI: soft, non-tender; bowel sounds normal; no masses,  no organomegaly Extremities: extremities normal, atraumatic, no cyanosis or edema  ECOG PERFORMANCE STATUS: 0 - Asymptomatic  Blood pressure 131/65, pulse 65, temperature 98.1 F (36.7 C), temperature source Oral, resp. rate 18, height 5\' 2"  (1.575 m), weight 134 lb 4.8 oz (60.918 kg), SpO2 99.00%.  LABORATORY DATA: Lab Results  Component Value Date   WBC 8.0 08/31/2013   HGB 13.1 08/31/2013   HCT 39.3 08/31/2013   MCV 87.5 08/31/2013   PLT 252 08/31/2013      Chemistry      Component Value Date/Time   NA 137 06/19/2013 1215   K 4.1 06/19/2013 1215   CL 100 06/19/2013 1215   CO2 29 06/19/2013 1215   BUN 12 06/19/2013 1215   CREATININE 0.58 06/19/2013 1215      Component Value Date/Time   CALCIUM 9.4 06/19/2013 1215   ALKPHOS 60 06/19/2013 1215   AST 28  06/19/2013 1215   ALT 23 06/19/2013 1215   BILITOT 0.4 06/19/2013 1215       RADIOGRAPHIC STUDIES: No results found.  ASSESSMENT AND PLAN: This is a very pleasant 72 years old white female with history of iron deficiency anemia status post Feraheme infusion in October of 2013 and has been observation since that time. Her hemoglobin and hematocrit as well as the iron study and ferritin are normal today. I discussed the lab result with the patient and recommended for her to  continue on observation with repeat CBC and iron study in 6 months. She was advised to call immediately she has any concerning symptoms in the interval. The patient voices understanding of current disease status and treatment options and is in agreement with the current care plan.  All questions were answered. The patient knows to call the clinic with any problems, questions or concerns. We can certainly see the patient much sooner if necessary.

## 2013-09-05 NOTE — Telephone Encounter (Signed)
Gave pt appt for lab before Md visit for April 2014

## 2014-03-01 ENCOUNTER — Encounter (INDEPENDENT_AMBULATORY_CARE_PROVIDER_SITE_OTHER): Payer: Self-pay

## 2014-03-01 ENCOUNTER — Other Ambulatory Visit (HOSPITAL_BASED_OUTPATIENT_CLINIC_OR_DEPARTMENT_OTHER): Payer: Medicare Other

## 2014-03-01 DIAGNOSIS — D509 Iron deficiency anemia, unspecified: Secondary | ICD-10-CM

## 2014-03-01 LAB — CBC WITH DIFFERENTIAL/PLATELET
BASO%: 0.7 % (ref 0.0–2.0)
Basophils Absolute: 0 10*3/uL (ref 0.0–0.1)
EOS%: 0.5 % (ref 0.0–7.0)
Eosinophils Absolute: 0 10*3/uL (ref 0.0–0.5)
HCT: 38.6 % (ref 34.8–46.6)
HGB: 12.7 g/dL (ref 11.6–15.9)
LYMPH#: 0.8 10*3/uL — AB (ref 0.9–3.3)
LYMPH%: 12.9 % — ABNORMAL LOW (ref 14.0–49.7)
MCH: 28.9 pg (ref 25.1–34.0)
MCHC: 32.9 g/dL (ref 31.5–36.0)
MCV: 87.7 fL (ref 79.5–101.0)
MONO#: 0.2 10*3/uL (ref 0.1–0.9)
MONO%: 2.9 % (ref 0.0–14.0)
NEUT#: 5.3 10*3/uL (ref 1.5–6.5)
NEUT%: 83 % — ABNORMAL HIGH (ref 38.4–76.8)
Platelets: 291 10*3/uL (ref 145–400)
RBC: 4.4 10*6/uL (ref 3.70–5.45)
RDW: 15 % — AB (ref 11.2–14.5)
WBC: 6.4 10*3/uL (ref 3.9–10.3)

## 2014-03-01 LAB — IRON AND TIBC CHCC
%SAT: 18 % — ABNORMAL LOW (ref 21–57)
IRON: 60 ug/dL (ref 41–142)
TIBC: 331 ug/dL (ref 236–444)
UIBC: 272 ug/dL (ref 120–384)

## 2014-03-01 LAB — FERRITIN CHCC: FERRITIN: 37 ng/mL (ref 9–269)

## 2014-03-06 ENCOUNTER — Ambulatory Visit (HOSPITAL_BASED_OUTPATIENT_CLINIC_OR_DEPARTMENT_OTHER): Payer: Medicare Other | Admitting: Internal Medicine

## 2014-03-06 ENCOUNTER — Encounter: Payer: Self-pay | Admitting: Internal Medicine

## 2014-03-06 VITALS — BP 161/68 | HR 75 | Temp 98.2°F | Resp 18 | Ht 62.0 in | Wt 147.0 lb

## 2014-03-06 DIAGNOSIS — D509 Iron deficiency anemia, unspecified: Secondary | ICD-10-CM

## 2014-03-06 NOTE — Progress Notes (Signed)
[facility] Telephone:(336) [phone]   Fax:(336) [phone]  OFFICE PROGRESS NOTE  Toniann Fail, MD [address] [place] [address]  DIAGNOSIS: Iron deficiency anemia with intolerable oral iron intake   PRIOR THERAPY: Status post Feraheme infusion x2 doses. Last dose was given 09/26/2012   CURRENT THERAPY: None.  INTERVAL HISTORY: Isabel Barnes 73 y.o. female returns to the clinic today for three-month followup visit. The patient is feeling fine today with no specific complaints pain in the back of the neck with radiation to the head secondary to arthritis. She was seen recently by her primary care physician as well as a rheumatologist and was started on treatment with steroids which did help her some. She may also see Dr. Danielle Dess for consideration of surgical intervention. She denied having any significant fatigue or weakness. She denied having any dizzy spells. The patient has no bleeding, bruises or ecchymosis. She denied having any significant chest pain, shortness of breath, cough or hemoptysis. She had repeat CBC and iron study performed recently and she is here for evaluation and discussion of her lab results. Her iron study showed normal serum iron 60, total iron binding capacity 272, iron saturation 18% and ferritin 37.  MEDICAL HISTORY: Past Medical History  Diagnosis Date  . Hypothyroidism   . Chronic kidney disease     hx of kidney stones  . Chronic pain disorder   . Osteoarthritis     lumbar,cervical,joints  . Spondylitis   . Complication of anesthesia     severe claustrophobia    ALLERGIES:  is allergic to dilaudid; gabapentin; lyrica; singulair; banana; ciprofloxacin hcl; codeine; methadone; metronidazole; nucynta; oysters; penicillins; sulfur; betadine; and latex.  MEDICATIONS:  Current Outpatient Prescriptions  Medication Sig Dispense Refill  . albuterol (PROAIR HFA) 108 (90 BASE) MCG/ACT inhaler Inhale 2 puffs into the lungs  every 4 (four) hours as needed for shortness of breath.       Marland Kitchen aspirin EC 81 MG tablet Take 81 mg by mouth every morning.      . Biotin 10 MG CAPS Take 10 mg by mouth 2 (two) times daily.       . calcium-vitamin D (OSCAL WITH D) 500-200 MG-UNIT per tablet Take 1 tablet by mouth every morning.       . celecoxib (CELEBREX) 200 MG capsule Take 200 mg by mouth daily.      . cholecalciferol (VITAMIN D) 1000 UNITS tablet Take 1,000 Units by mouth 2 (two) times daily.       . diclofenac sodium (VOLTAREN) 1 % GEL Apply 2 g topically 4 (four) times daily as needed (For pain.).       Marland Kitchen diphenhydrAMINE (BENADRYL) 25 MG tablet Take 50 mg by mouth every 6 (six) hours as needed for allergies.      Marland Kitchen estradiol (ESTRACE) 0.5 MG tablet Take 0.5 mg by mouth every morning.       . fish oil-omega-3 fatty acids 1000 MG capsule Take 1,000 mg by mouth every morning.       Marland Kitchen HYDROcodone-acetaminophen (NORCO) 10-325 MG per tablet Take 1 tablet by mouth every 4 (four) hours as needed for pain.  20 tablet  0  . levothyroxine (SYNTHROID, LEVOTHROID) 50 MCG tablet Take 50 mcg by mouth every morning.       Marland Kitchen losartan (COZAAR) 25 MG tablet Take 25 mg by mouth daily. 1/2 tab to 1 tablet daily      . magnesium  30 MG tablet Take 30 mg by mouth 2 (two) times daily.      . Multiple Vitamins-Minerals (MULTIVITAMIN WITH MINERALS) tablet Take 1 tablet by mouth every morning.       . predniSONE (DELTASONE) 5 MG tablet Take 5 mg by mouth daily.      . prochlorperazine (COMPAZINE) 10 MG tablet Take 10 mg by mouth every 8 (eight) hours as needed (For nausea.).       Marland Kitchen Red Yeast Rice 600 MG CAPS Take 600 mg by mouth daily after supper.       . tetrahydrozoline 0.05 % ophthalmic solution Place 1 drop into both eyes every morning.      . Turmeric 500 MG CAPS Take 500 mg by mouth 2 (two) times daily.       . vitamin B-12 (CYANOCOBALAMIN) 250 MCG tablet Take 250 mcg by mouth daily after supper.        No current facility-administered  medications for this visit.    SURGICAL HISTORY:  Past Surgical History  Procedure Laterality Date  . Abdominal hysterectomy    . Cholecystectomy    . Joint replacement Right 2012    shoulder  . Shoulder arthroscopy w/ rotator cuff repair Bilateral three times each over several yrs  . Cystoscopy w/ ureteroscopy  2012  . Thumb arthroscopy    . Nasal sinus surgery    . Anterior fusion cervical spine      x2 -C4-7  . Eye surgery Bilateral     cataract /lens implant  . Holmium laser application Left 02/08/2013    Procedure: HOLMIUM LASER APPLICATION;  Surgeon: Anner Crete, MD;  Location: [facility];  Service: Urology;  Laterality: Left;    REVIEW OF SYSTEMS:  A comprehensive review of systems was negative except for: Musculoskeletal: positive for arthralgias and neck pain   PHYSICAL EXAMINATION: General appearance: alert, cooperative and no distress Head: Normocephalic, without obvious abnormality, atraumatic Neck: no adenopathy, no JVD, supple, symmetrical, trachea midline and thyroid not enlarged, symmetric, no tenderness/mass/nodules Lymph nodes: Cervical, supraclavicular, and axillary nodes normal. Resp: clear to auscultation bilaterally Back: symmetric, no curvature. ROM normal. No CVA tenderness. Cardio: regular rate and rhythm, S1, S2 normal, no murmur, click, rub or gallop GI: soft, non-tender; bowel sounds normal; no masses,  no organomegaly Extremities: extremities normal, atraumatic, no cyanosis or edema  ECOG PERFORMANCE STATUS: 0 - Asymptomatic  Blood pressure 161/68, pulse 75, temperature 98.2 F (36.8 C), temperature source Oral, resp. rate 18, height 5\' 2"  (1.575 m), weight 147 lb (66.679 kg).  LABORATORY DATA: Lab Results  Component Value Date   WBC 6.4 03/01/2014   HGB 12.7 03/01/2014   HCT 38.6 03/01/2014   MCV 87.7 03/01/2014   PLT 291 03/01/2014      Chemistry      Component Value Date/Time   NA 137 06/19/2013 1215   K 4.1 06/19/2013 1215    CL 100 06/19/2013 1215   CO2 29 06/19/2013 1215   BUN 12 06/19/2013 1215   CREATININE 0.58 06/19/2013 1215      Component Value Date/Time   CALCIUM 9.4 06/19/2013 1215   ALKPHOS 60 06/19/2013 1215   AST 28 06/19/2013 1215   ALT 23 06/19/2013 1215   BILITOT 0.4 06/19/2013 1215       RADIOGRAPHIC STUDIES: No results found.  ASSESSMENT AND PLAN: This is a very pleasant 73 years old white female with history of iron deficiency anemia status post Feraheme infusion in October  of 2013 and has been observation since that time. Her hemoglobin and hematocrit as well as the iron study and ferritin are normal today. I discussed the lab result with the patient and recommended for her to continue on observation with repeat CBC and iron study in 6 months. She will followup with her rheumatologist and neurosurgeon for her neck pain. She was advised to call immediately she has any concerning symptoms in the interval. The patient voices understanding of current disease status and treatment options and is in agreement with the current care plan.  All questions were answered. The patient knows to call the clinic with any problems, questions or concerns. We can certainly see the patient much sooner if necessary.  Disclaimer: This note was dictated with voice recognition software. Similar sounding words can inadvertently be transcribed and may not be corrected upon review.

## 2014-03-07 ENCOUNTER — Telehealth: Payer: Self-pay | Admitting: Internal Medicine

## 2014-03-07 NOTE — Telephone Encounter (Signed)
s.w. pt and advised on OCT appt....pt ok and aware °

## 2014-04-12 ENCOUNTER — Other Ambulatory Visit: Payer: Self-pay | Admitting: Family Medicine

## 2014-04-12 DIAGNOSIS — M542 Cervicalgia: Secondary | ICD-10-CM

## 2014-04-30 ENCOUNTER — Encounter (HOSPITAL_COMMUNITY): Payer: Self-pay

## 2014-05-01 NOTE — Pre-Procedure Instructions (Addendum)
Isabel Barnes  05/01/2014   Your procedure is scheduled on: Thursday, May 09, 2014.  Report to [facility] Entrance "A" [address] at 6:00 AM.  Call this number if you have problems the morning of surgery: [phone]   Remember:   Do not eat food or drink liquids after midnight.   Take these medicines the morning of surgery with A SIP OF WATER: Albuterol (Proair) inhaler if needed, hydrocodone-acetaminophen (Norco/Vicodin), levothyroxine (Synthroid), omeprazole (Prilosec), prochlorperazine (Compazine) if needed, tetrahydrozoline (eye drop)     Do not wear jewelry, make-up or nail polish.  Do not wear lotions, powders, or perfumes. You may wear deodorant.  Do not shave 48 hours prior to surgery.  Do not bring valuables to the hospital.  [facility] is not responsible                  for any belongings or valuables.               Contacts, dentures or bridgework may not be worn into surgery.  Leave suitcase in the car. After surgery it may be brought to your room.  For patients admitted to the hospital, discharge time is determined by your                treatment team.               Patients discharged the day of surgery will not be allowed to drive  home.   Please read over the following fact sheets that you were given: Pain Booklet

## 2014-05-02 ENCOUNTER — Encounter (HOSPITAL_COMMUNITY): Payer: Self-pay

## 2014-05-02 ENCOUNTER — Encounter (HOSPITAL_COMMUNITY)
Admission: RE | Admit: 2014-05-02 | Discharge: 2014-05-02 | Disposition: A | Discharge status: Home / Self Care | Payer: Medicare Other | Source: Ambulatory Visit | Attending: Family Medicine | Admitting: Family Medicine

## 2014-05-02 ENCOUNTER — Ambulatory Visit (HOSPITAL_COMMUNITY)
Admission: RE | Admit: 2014-05-02 | Discharge: 2014-05-02 | Disposition: A | Discharge status: Home / Self Care | Payer: Medicare Other | Source: Ambulatory Visit | Attending: Anesthesiology | Admitting: Anesthesiology

## 2014-05-02 DIAGNOSIS — I10 Essential (primary) hypertension: Secondary | ICD-10-CM | POA: Insufficient documentation

## 2014-05-02 DIAGNOSIS — J4489 Other specified chronic obstructive pulmonary disease: Secondary | ICD-10-CM | POA: Insufficient documentation

## 2014-05-02 DIAGNOSIS — J449 Chronic obstructive pulmonary disease, unspecified: Secondary | ICD-10-CM | POA: Insufficient documentation

## 2014-05-02 HISTORY — DX: Essential (primary) hypertension: I10

## 2014-05-02 HISTORY — DX: Gastro-esophageal reflux disease without esophagitis: K21.9

## 2014-05-02 HISTORY — DX: Anemia, unspecified: D64.9

## 2014-05-02 HISTORY — DX: Other reaction to spinal and lumbar puncture: G97.1

## 2014-05-02 LAB — CBC
HCT: 39.4 % (ref 36.0–46.0)
HEMOGLOBIN: 13.4 g/dL (ref 12.0–15.0)
MCH: 30 pg (ref 26.0–34.0)
MCHC: 34 g/dL (ref 30.0–36.0)
MCV: 88.1 fL (ref 78.0–100.0)
PLATELETS: 304 10*3/uL (ref 150–400)
RBC: 4.47 MIL/uL (ref 3.87–5.11)
RDW: 14.1 % (ref 11.5–15.5)
WBC: 8.8 10*3/uL (ref 4.0–10.5)

## 2014-05-02 LAB — BASIC METABOLIC PANEL
BUN: 11 mg/dL (ref 6–23)
CALCIUM: 9.3 mg/dL (ref 8.4–10.5)
CO2: 29 mEq/L (ref 19–32)
CREATININE: 0.74 mg/dL (ref 0.50–1.10)
Chloride: 102 mEq/L (ref 96–112)
GFR calc Af Amer: >90 mL/min (ref >90)
GFR, EST NON AFRICAN AMERICAN: 82 mL/min — AB (ref >90)
GLUCOSE: 99 mg/dL (ref 70–99)
Potassium: 4.6 mEq/L (ref 3.7–5.3)
Sodium: 141 mEq/L (ref 137–147)

## 2014-05-02 NOTE — Progress Notes (Signed)
  Pt denies seeing a Cardiologist, denies chest pain. Pt reports treadmill stress test done 3-5 years ago at [facility] on [address], could not locate in [place]. Pt could not remember MD name. [facility] (medical records) called, voice mail left [phone]) and fax sent.

## 2014-05-06 NOTE — Progress Notes (Signed)
Spoke with Selena Batten at [facility] called and asked about stress test done 3-5 years ago. No stress test was found.

## 2014-05-09 ENCOUNTER — Encounter (HOSPITAL_COMMUNITY)
Admission: RE | Disposition: A | Discharge status: Home / Self Care | Payer: Self-pay | Source: Ambulatory Visit | Attending: Family Medicine

## 2014-05-09 ENCOUNTER — Ambulatory Visit (HOSPITAL_COMMUNITY)
Admission: RE | Admit: 2014-05-09 | Discharge: 2014-05-09 | Disposition: A | Discharge status: Home / Self Care | Payer: Medicare Other | Source: Ambulatory Visit | Attending: Family Medicine | Admitting: Family Medicine

## 2014-05-09 ENCOUNTER — Encounter (HOSPITAL_COMMUNITY): Payer: Self-pay | Admitting: Anesthesiology

## 2014-05-09 ENCOUNTER — Encounter (HOSPITAL_COMMUNITY): Payer: Self-pay | Admitting: *Deleted

## 2014-05-09 DIAGNOSIS — M542 Cervicalgia: Secondary | ICD-10-CM | POA: Insufficient documentation

## 2014-05-09 HISTORY — PX: RADIOLOGY WITH ANESTHESIA: SHX6223

## 2014-05-09 SURGERY — RADIOLOGY WITH ANESTHESIA
Anesthesia: General

## 2014-05-09 MED ORDER — ONDANSETRON HCL 4 MG/2ML IJ SOLN: 4.0000 mg | Freq: Once | INTRAMUSCULAR | Status: DC | PRN

## 2014-05-09 MED ORDER — OXYCODONE HCL 5 MG/5ML PO SOLN: 5.0000 mg | Freq: Once | ORAL | Status: DC | PRN

## 2014-05-09 MED ORDER — OXYCODONE HCL 5 MG PO TABS: 5.0000 mg | ORAL_TABLET | Freq: Once | ORAL | Status: DC | PRN

## 2014-05-09 MED ORDER — FENTANYL CITRATE 0.05 MG/ML IJ SOLN: 25.0000 ug | INTRAMUSCULAR | Status: DC | PRN

## 2014-05-09 NOTE — Anesthesia Postprocedure Evaluation (Signed)
  Anesthesia Post-op Note  Patient: Isabel Barnes  Procedure(s) Performed: Procedure(s) with comments: ADULT SEDATION WITH ANESTHESIA/MRI CERVICAL SPINE WITHOUT CONTRAST (N/A) - DR. HAWKS/MRI  Patient Location: PACU  Anesthesia Type:General  Level of Consciousness: awake, alert  and oriented  Airway and Oxygen Therapy: Patient Spontanous Breathing  Post-op Pain: none  Post-op Assessment: Post-op Vital signs reviewed, Patient's Cardiovascular Status Stable, Respiratory Function Stable, Patent Airway and Pain level controlled  Post-op Vital Signs: stable  Last Vitals:  Filed Vitals:   05/09/14 0946  BP:   Pulse:   Temp: 36.6 C  Resp:     Complications: No apparent anesthesia complications

## 2014-05-09 NOTE — Anesthesia Preprocedure Evaluation (Addendum)
Anesthesia Evaluation  Patient identified by MRN, date of birth, ID band Patient awake    Reviewed: Allergy & Precautions, H&P , Patient's Chart, lab work & pertinent test results  Airway Mallampati: II TM Distance: >3 FB Neck ROM: Full    Dental  (+) Teeth Intact, Dental Advisory Given   Pulmonary former smoker,  breath sounds clear to auscultation        Cardiovascular hypertension, Rhythm:Regular Rate:Normal     Neuro/Psych    GI/Hepatic   Endo/Other    Renal/GU      Musculoskeletal   Abdominal   Peds  Hematology   Anesthesia Other Findings   Reproductive/Obstetrics                         Anesthesia Physical Anesthesia Plan  ASA: II  Anesthesia Plan: General   Post-op Pain Management:    Induction: Intravenous  Airway Management Planned: LMA  Additional Equipment:   Intra-op Plan:   Post-operative Plan:   Informed Consent: I have reviewed the patients History and Physical, chart, labs and discussed the procedure including the risks, benefits and alternatives for the proposed anesthesia with the patient or authorized representative who has indicated his/her understanding and acceptance.   Dental advisory given  Plan Discussed with: CRNA and Anesthesiologist  Anesthesia Plan Comments: (Cervical spondylosis needs MRI Hypertension Anxiety  Plan GA with LMA  Kipp Brood, MD)        Anesthesia Quick Evaluation

## 2014-05-09 NOTE — Anesthesia Postprocedure Evaluation (Signed)
  Anesthesia Post-op Note  Patient: Isabel Barnes  Procedure(s) Performed: Procedure(s) with comments: ADULT SEDATION WITH ANESTHESIA/MRI CERVICAL SPINE WITHOUT CONTRAST (N/A) - DR. HAWKS/MRI  Patient Location: PACU  Anesthesia Type:General  Level of Consciousness: awake, alert  and oriented  Airway and Oxygen Therapy: Patient Spontanous Breathing and Patient connected to nasal cannula oxygen  Post-op Pain: none  Post-op Assessment: Post-op Vital signs reviewed, Patient's Cardiovascular Status Stable, Respiratory Function Stable, Patent Airway and Pain level controlled  Post-op Vital Signs: stable  Last Vitals:  Filed Vitals:   05/09/14 1045  BP:   Pulse: 77  Temp:   Resp: 13    Complications: No apparent anesthesia complications

## 2014-05-13 ENCOUNTER — Encounter (HOSPITAL_COMMUNITY): Payer: Self-pay | Admitting: Radiology

## 2014-05-22 ENCOUNTER — Ambulatory Visit (INDEPENDENT_AMBULATORY_CARE_PROVIDER_SITE_OTHER): Payer: Medicare Other | Admitting: Surgery

## 2014-05-22 ENCOUNTER — Encounter (INDEPENDENT_AMBULATORY_CARE_PROVIDER_SITE_OTHER): Payer: Self-pay | Admitting: Surgery

## 2014-05-22 VITALS — BP 118/76 | HR 72 | Temp 97.6°F | Resp 16 | Ht 62.5 in | Wt 145.8 lb

## 2014-05-22 DIAGNOSIS — K432 Incisional hernia without obstruction or gangrene: Secondary | ICD-10-CM | POA: Insufficient documentation

## 2014-05-22 DIAGNOSIS — M1990 Unspecified osteoarthritis, unspecified site: Secondary | ICD-10-CM | POA: Insufficient documentation

## 2014-05-22 DIAGNOSIS — K429 Umbilical hernia without obstruction or gangrene: Secondary | ICD-10-CM

## 2014-05-22 DIAGNOSIS — G894 Chronic pain syndrome: Secondary | ICD-10-CM | POA: Insufficient documentation

## 2014-05-22 NOTE — Patient Instructions (Signed)
Please consider the recommendations that we have given you today:  Consider surgery to reduce and repair the hernia around your bellybutton.  See the Handout(s) we have given you.  Please call our office at [phone] if you wish to schedule surgery or if you have further questions / concerns.   Hernia A hernia occurs when an internal organ pushes out through a weak spot in the abdominal wall. Hernias most commonly occur in the groin and around the navel. Hernias often can be pushed back into place (reduced). Most hernias tend to get worse over time. Some abdominal hernias can get stuck in the opening (irreducible or incarcerated hernia) and cannot be reduced. An irreducible abdominal hernia which is tightly squeezed into the opening is at risk for impaired blood supply (strangulated hernia). A strangulated hernia is a medical emergency. Because of the risk for an irreducible or strangulated hernia, surgery may be recommended to repair a hernia. CAUSES   Heavy lifting.  Prolonged coughing.  Straining to have a bowel movement.  A cut (incision) made during an abdominal surgery. HOME CARE INSTRUCTIONS   Bed rest is not required. You may continue your normal activities.  Avoid lifting more than 10 pounds (4.5 kg) or straining.  Cough gently. If you are a smoker it is best to stop. Even the best hernia repair can break down with the continual strain of coughing. Even if you do not have your hernia repaired, a cough will continue to aggravate the problem.  Do not wear anything tight over your hernia. Do not try to keep it in with an outside bandage or truss. These can damage abdominal contents if they are trapped within the hernia sac.  Eat a normal diet.  Avoid constipation. Straining over long periods of time will increase hernia size and encourage breakdown of repairs. If you cannot do this with diet alone, stool softeners may be used. SEEK IMMEDIATE MEDICAL CARE IF:   You have a  fever.  You develop increasing abdominal pain.  You feel nauseous or vomit.  Your hernia is stuck outside the abdomen, looks discolored, feels hard, or is tender.  You have any changes in your bowel habits or in the hernia that are unusual for you.  You have increased pain or swelling around the hernia.  You cannot push the hernia back in place by applying gentle pressure while lying down. MAKE SURE YOU:   Understand these instructions.  Will watch your condition.  Will get help right away if you are not doing well or get worse. Document Released: 11/15/2005 Document Revised: 02/07/2012 Document Reviewed: 07/04/2008 [facility] Patient Information 2015 [employer] [place]. This information is not intended to replace advice given to you by your health care provider. Make sure you discuss any questions you have with your health care provider.  HERNIA REPAIR: POST OP INSTRUCTIONS  1. DIET: Follow a light bland diet the first 24 hours after arrival home, such as soup, liquids, crackers, etc.  Be sure to include lots of fluids daily.  Avoid fast food or heavy meals as your are more likely to get nauseated.  Eat a low fat the next few days after surgery. 2. Take your usually prescribed home medications unless otherwise directed. 3. PAIN CONTROL: a. Pain is best controlled by a usual combination of three different methods TOGETHER: i. Ice/Heat ii. Over the counter pain medication iii. Prescription pain medication b. Most patients will experience some swelling and bruising around the hernia(s) such as the bellybutton,  groins, or old incisions.  Ice packs or heating pads (30-60 minutes up to 6 times a day) will help. Use ice for the first few days to help decrease swelling and bruising, then switch to heat to help relax tight/sore spots and speed recovery.  Some people prefer to use ice alone, heat alone, alternating between ice & heat.  Experiment to what works for you.  Swelling and bruising can  take several weeks to resolve.   c. It is helpful to take an over-the-counter pain medication regularly for the first few weeks.  Choose one of the following that works best for you: i. Naproxen (Aleve, etc)  Two 220mg  tabs twice a day ii. Ibuprofen (Advil, etc) Three 200mg  tabs four times a day (every meal & bedtime) iii. Acetaminophen (Tylenol, etc) 325-650mg  four times a day (every meal & bedtime) d. A  prescription for pain medication should be given to you upon discharge.  Take your pain medication as prescribed.  i. If you are having problems/concerns with the prescription medicine (does not control pain, nausea, vomiting, rash, itching, etc), please call us [phone] to see if we need to switch you to a different pain medicine that will work better for you and/or control your side effect better. ii. If you need a refill on your pain medication, please contact your pharmacy.  They will contact our office to request authorization. Prescriptions will not be filled after 5 pm or on week-ends. 4. Avoid getting constipated.  Between the surgery and the pain medications, it is common to experience some constipation.  Increasing fluid intake and taking a fiber supplement (such as Metamucil, Citrucel, FiberCon, MiraLax, etc) 1-2 times a day regularly will usually help prevent this problem from occurring.  A mild laxative (prune juice, Milk of Magnesia, MiraLax, etc) should be taken according to package directions if there are no bowel movements after 48 hours.   5. Wash / shower every day.  You may shower over the dressings as they are waterproof.   6. Remove your waterproof bandages 5 days after surgery.  You may leave the incision open to air.  You may replace a dressing/Band-Aid to cover the incision for comfort if you wish.  Continue to shower over incision(s) after the dressing is off.    7. ACTIVITIES as tolerated:   a. You may resume regular (light) daily activities beginning the next  day-such as daily self-care, walking, climbing stairs-gradually increasing activities as tolerated.  If you can walk 30 minutes without difficulty, it is safe to try more intense activity such as jogging, treadmill, bicycling, low-impact aerobics, swimming, etc. b. Save the most intensive and strenuous activity for last such as sit-ups, heavy lifting, contact sports, etc  Refrain from any heavy lifting or straining until you are off narcotics for pain control.   c. DO NOT PUSH THROUGH PAIN.  Let pain be your guide: If it hurts to do something, don't do it.  Pain is your body warning you to avoid that activity for another week until the pain goes down. d. You may drive when you are no longer taking prescription pain medication, you can comfortably wear a seatbelt, and you can safely maneuver your car and apply brakes. e. Dennis Bast may have sexual intercourse when it is comfortable.  8. FOLLOW UP in our office a. Please call [facility] at [phone] to set up an appointment to see your surgeon in the office for a follow-up appointment approximately 2-3 weeks after your surgery.  b. Make sure that you call for this appointment the day you arrive home to insure a convenient appointment time. 9.  IF YOU HAVE DISABILITY OR FAMILY LEAVE FORMS, BRING THEM TO THE OFFICE FOR PROCESSING.  DO NOT GIVE THEM TO YOUR DOCTOR.  WHEN TO CALL US [phone]: 1. Poor pain control 2. Reactions / problems with new medications (rash/itching, nausea, etc)  3. Fever over 101.5 F (38.5 C) 4. Inability to urinate 5. Nausea and/or vomiting 6. Worsening swelling or bruising 7. Continued bleeding from incision. 8. Increased pain, redness, or drainage from the incision   The clinic staff is available to answer your questions during regular business hours (8:30am-5pm).  Please don't hesitate to call and ask to speak to one of our nurses for clinical concerns.   If you have a medical emergency, go to the nearest emergency room or call  911.  A [facility] from [facility] is always on call at the hospitals in [facility]  [facility] [place] [address], [address], [place], [place]  [address] ?  [address] [address], [place], [place]   [id number] MAIN: [phone] ? TOLL FREE: [phone] ? FAX: [phone] [email or web address]

## 2014-05-22 NOTE — Progress Notes (Signed)
Subjective:     [name], female   DOB: 07/02/1941, 73 y.o.   MRN: [id number]  HPI  Note: Portions of this report may have been transcribed using voice recognition software. Every effort was made to ensure accuracy; however, inadvertent computerized transcription errors may be present.   Any transcriptional errors that result from this process are unintentional.            ILLA ENLOW  July 30, 1941 [id number]  Patient Care Team: Al N. Lendon Colonel, MD as PCP - General (Family Medicine) Jamal Collin, PA-C as Physician Assistant (Physician Assistant)  This patient is a 73 y.o.female who presents today for surgical evaluation at the request of Jamal Collin.   Reason for visit: Worsening umbilical hernia.  Pleasant female.  Has had hernia at her bellybutton for several years.  Has not really bothered her.  However, she required steroids earlier in the year.  Markedly increased appetite.  Gained weight.  Has noticed the hernia at her bellybutton has gotten larger.  It does become more sensitive.  No nausea or vomiting.  He did concern are low.  Mentioned to her primary care team.  Surgical consultation recommended.  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  On chronic narcotics and pain medications.  Occasional constipation but has a bowel movement every day with a bowel regimen.  She can walk about 20 minutes before having to stop secondary to back and hip pain.  No history of cardiac or pulmonary disease.  Has not smoked in 20 years.  Patient Active Problem List   Diagnosis Date Noted  . Iron deficiency anemia 09/19/2012    Past Medical History  Diagnosis Date  . Hypothyroidism   . Chronic pain disorder   . Osteoarthritis     lumbar,cervical,joints  . Spondylitis   . Complication of anesthesia     severe claustrophobia  . Spinal headache 1991    blood patch placed  . Hypertension   . History of recurrent UTI (urinary tract  infection)   . GERD (gastroesophageal reflux disease)   . Anemia     iron deficiency  . Chronic kidney disease     hx of kidney stones  . Urinary tract infection     to start antibiotics today  . Umbilical hernia     to see surgeon next week    Past Surgical History  Procedure Laterality Date  . Abdominal hysterectomy    . Cholecystectomy    . Joint replacement Right 2012    shoulder  . Shoulder arthroscopy w/ rotator cuff repair Bilateral three times each over several yrs  . Cystoscopy w/ ureteroscopy  2012  . Thumb arthroscopy    . Nasal sinus surgery    . Anterior fusion cervical spine      x2 -C4-7  . Eye surgery Bilateral     cataract /lens implant  . Holmium laser application Left 02/08/2013    Procedure: HOLMIUM LASER APPLICATION;  Surgeon: Anner Crete, MD;  Location: [facility];  Service: Urology;  Laterality: Left;  . Radiology with anesthesia N/A 05/09/2014    Procedure: ADULT SEDATION WITH ANESTHESIA/MRI CERVICAL SPINE WITHOUT CONTRAST;  Surgeon: Medication Radiologist, MD;  Location: [facility] OR;  Service: Radiology;  Laterality: N/A;  DR. HAWKS/MRI    History   Social History  . Marital Status: Divorced    Spouse Name: N/A    Number of Children: N/A  . Years of Education: N/A  Occupational History  . Not on file.   Social History Main Topics  . Smoking status: Former Smoker    Quit date: 02/07/1992  . Smokeless tobacco: Never Used  . Alcohol Use: No  . Drug Use: No  . Sexual Activity: Not on file   Other Topics Concern  . Not on file   Social History Narrative  . No narrative on file    Family History  Problem Relation Age of Onset  . Stroke Father     Current Outpatient Prescriptions  Medication Sig Dispense Refill  . albuterol (PROAIR HFA) 108 (90 BASE) MCG/ACT inhaler Inhale 2 puffs into the lungs every 4 (four) hours as needed for shortness of breath.       Marland Kitchen aspirin EC 81 MG tablet Take 81 mg by mouth every morning.      .  Biotin 10 MG CAPS Take 10 mg by mouth 2 (two) times daily.       . calcium-vitamin D (OSCAL WITH D) 500-200 MG-UNIT per tablet Take 1 tablet by mouth every morning.       . celecoxib (CELEBREX) 200 MG capsule Take 200 mg by mouth daily.      . cholecalciferol (VITAMIN D) 1000 UNITS tablet Take 1,000 Units by mouth 2 (two) times daily.       . diclofenac sodium (VOLTAREN) 1 % GEL Apply 2 g topically 4 (four) times daily as needed (For pain.).       Marland Kitchen Digestive Enzymes (ENZYME DIGEST PO) Take by mouth.      . diphenhydrAMINE (BENADRYL) 25 MG tablet Take 50 mg by mouth every 6 (six) hours as needed for allergies.      Marland Kitchen estradiol (ESTRACE) 0.5 MG tablet Take 0.5 mg by mouth every other day.       . fish oil-omega-3 fatty acids 1000 MG capsule Take 1,000 mg by mouth every morning.       Marland Kitchen HYDROcodone-acetaminophen (NORCO) 10-325 MG per tablet Take 1 tablet by mouth every 4 (four) hours as needed for pain.  20 tablet  0  . levothyroxine (SYNTHROID, LEVOTHROID) 50 MCG tablet Take 50 mcg by mouth every morning.       Marland Kitchen losartan (COZAAR) 25 MG tablet Take 25 mg by mouth daily.       . magnesium 30 MG tablet Take 30 mg by mouth 2 (two) times daily.      . Misc Natural Products (TART CHERRY ADVANCED PO) Take by mouth.      . Multiple Vitamins-Minerals (MULTIVITAMIN WITH MINERALS) tablet Take 1 tablet by mouth every morning.       Marland Kitchen omeprazole (PRILOSEC) 20 MG capsule Take 20 mg by mouth 2 (two) times daily before a meal.      . Probiotic Product (PROBIOTIC DAILY PO) Take by mouth.      . prochlorperazine (COMPAZINE) 10 MG tablet Take 10 mg by mouth every 8 (eight) hours as needed (For nausea.).       Marland Kitchen Red Yeast Rice 600 MG CAPS Take 1,200 mg by mouth daily after supper.       . tetrahydrozoline 0.05 % ophthalmic solution Place 1 drop into both eyes every morning.      . Turmeric 500 MG CAPS Take 500 mg by mouth 2 (two) times daily.       . vitamin B-12 (CYANOCOBALAMIN) 250 MCG tablet Take 250 mcg by  mouth daily after supper.        No current facility-administered  medications for this visit.     Allergies  Allergen Reactions  . Dilaudid [Hydromorphone Hcl] Shortness Of Breath  . Gabapentin Other (See Comments)    Hoarseness , headache and sore throat  . Lyrica [Pregabalin] Other (See Comments)    No balance , had to walk with cane , Blurred vision,weakness.  . Methadone Nausea And Vomiting    severe  . Singulair [Montelukast Sodium] Shortness Of Breath  . Banana Other (See Comments)    Abdominal cramping Cantaloupe also  . Ciprofloxacin Hcl Nausea And Vomiting    Nausea and vomiting in po form  . Codeine Other (See Comments)    hallucinations  . Metronidazole Nausea And Vomiting    Gastric pain  . Nucynta [Tapentadol] Other (See Comments)    nightmares  . Oysters [Shellfish Allergy] Other (See Comments)    "Terrible gastric upset and cramping."  . Penicillins Nausea And Vomiting    rash  . Sulfur Nausea And Vomiting  . Betadine [Povidone Iodine] Rash    Oyster shell products-rash  . Latex Rash    BP 118/76  Pulse 72  Temp(Src) 97.6 F (36.4 C) (Temporal)  Resp 16  Ht 5' 2.5" (1.588 m)  Wt 145 lb 12.8 oz (66.134 kg)  BMI 26.23 kg/m2  Dg Chest 2 View  05/02/2014   CLINICAL DATA:  Hypertension  EXAM: CHEST  2 VIEW  COMPARISON:  01/01/2013  FINDINGS: Postsurgical changes are again seen in the cervical spine and right shoulder. Cardiac shadow is stable. The lungs are hyperinflated without focal infiltrate or sizable effusion. No acute bony abnormality is seen. Old rib fractures are again noted on the right.  IMPRESSION: COPD without acute abnormality.   Electronically Signed   By: Alcide CleverMark  Lukens M.D.   On: 05/02/2014 15:31   Mr Cervical Spine Wo Contrast  05/09/2014   CLINICAL DATA:  73 year old female with nonspecific complaint of pain in back of neck and with radiation to head secondary to arthritis. History of iron deficiency anemia.  EXAM: MRI CERVICAL SPINE WITHOUT  CONTRAST  TECHNIQUE: Multiplanar, multisequence MR imaging of the cervical spine was performed. No intravenous contrast was administered.  COMPARISON:  No comparison cervical spine exam.  FINDINGS: Visualized intracranial structures and cervical medullary junction unremarkable.  Visualized paravertebral structures unremarkable. Both vertebral arteries are patent.  Heterogeneous bone marrow may be related to patient's anemia. No focal worrisome osseous lesion.  Degenerative changes right C1-2 articulation with subchondral cystic changes.  Transverse ligament hypertrophy. Mild spinal stenosis without cord deformity.  C2-3: Mild facet joint degenerative changes. Vertebral arteries extend into the neural foramen more notable on the right. No spinal stenosis.  C3-4: Prior fusion from anterior approach. Minimal spur greater to left approaches the left ventral nerve root. Minimal left foraminal narrowing.  C4-5: Facet joint degenerative changes greater on the left. No significant foraminal narrowing.  C5-6: Facet joint degenerative changes greater on the left. Minimal left foraminal narrowing. Minimal bulge and spur without significant spinal stenosis.  C6-7: Prior fusion from anterior approach. Spur. No significant spinal stenosis or foraminal narrowing.  C7-T1: Mild facet joint degenerative changes greater on left. Minimal bulge. Minimal left  T1-2:  Negative.  T2-3:  Negative.  T3-4:  Minimal bulge.  IMPRESSION: Prior fusion C3-4 and C6-7 with small spur at these levels as noted above.  Facet joint degenerative changes greatest on the left at the C5-6 level.  Right C1-2 articulation degenerative changes.  Mild transverse ligament hypertrophy.  No significant disc protrusion  detected.  Please see above for further detail.   Electronically Signed   By: Bridgett Larsson M.D.   On: 05/09/2014 10:31     Review of Systems  Constitutional: Negative for fever, chills, diaphoresis, appetite change and fatigue.  HENT: Negative  for ear discharge, ear pain, sore throat and trouble swallowing.   Eyes: Negative for photophobia, discharge and visual disturbance.  Respiratory: Negative for cough, choking, chest tightness and shortness of breath.   Cardiovascular: Negative for chest pain and palpitations.  Gastrointestinal: Negative for nausea, vomiting, abdominal pain, diarrhea, constipation, anal bleeding and rectal pain.  Endocrine: Negative for cold intolerance and heat intolerance.  Genitourinary: Negative for dysuria, frequency and difficulty urinating.  Musculoskeletal: Negative for gait problem, myalgias and neck pain.  Skin: Negative for color change, pallor and rash.  Allergic/Immunologic: Negative for environmental allergies, food allergies and immunocompromised state.  Neurological: Negative for dizziness, speech difficulty, weakness and numbness.  Hematological: Negative for adenopathy.  Psychiatric/Behavioral: Negative for confusion and agitation. The patient is not nervous/anxious.        Objective:   Physical Exam  Constitutional: She is oriented to person, place, and time. She appears well-developed and well-nourished. No distress.  HENT:  Head: Normocephalic.  Mouth/Throat: Oropharynx is clear and moist. No oropharyngeal exudate.  Eyes: Conjunctivae and EOM are normal. Pupils are equal, round, and reactive to light. No scleral icterus.  Neck: Normal range of motion. Neck supple. No tracheal deviation present.  Cardiovascular: Normal rate, regular rhythm and intact distal pulses.   Pulmonary/Chest: Effort normal and breath sounds normal. No stridor. No respiratory distress. She exhibits no tenderness.  Abdominal: Soft. She exhibits no distension and no mass. There is no tenderness. A hernia is present. Hernia confirmed positive in the ventral area. Hernia confirmed negative in the right inguinal area and confirmed negative in the left inguinal area.    Genitourinary: No vaginal discharge found.    Musculoskeletal: Normal range of motion. She exhibits no tenderness.       Right elbow: She exhibits normal range of motion.       Left elbow: She exhibits normal range of motion.       Right wrist: She exhibits normal range of motion.       Left wrist: She exhibits normal range of motion.       Right hand: Normal strength noted.       Left hand: Normal strength noted.  Lymphadenopathy:       Head (right side): No posterior auricular adenopathy present.       Head (left side): No posterior auricular adenopathy present.    She has no cervical adenopathy.    She has no axillary adenopathy.       Right: No inguinal adenopathy present.       Left: No inguinal adenopathy present.  Neurological: She is alert and oriented to person, place, and time. No cranial nerve deficit. Coordination normal.  Mildly weak right hand grip  Skin: Skin is warm and dry. No rash noted. She is not diaphoretic. No erythema.  Psychiatric: She has a normal mood and affect. Her behavior is normal. Judgment and thought content normal.       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  Partially reducible.  Increasing & more sensitive.     Plan:     I think she would benefit from repair since it is cut larger become more sensitive.  She is rather functional independent.  She wishes to be aggressive and repaired.  I spent some time going over risks of observation vs. Risks of surgery.  She is inclined to proceed with surgery.  I would do a laparoscopic approach with underlay mesh.  Hopefully could be outpatient surgery vs. Overnight stay:  The anatomy & physiology of the abdominal wall was discussed.  The pathophysiology of hernias was discussed.  Natural history risks without surgery including progeressive enlargement, pain, incarceration & strangulation was discussed.   Contributors to complications such as smoking, obesity, diabetes, prior surgery, etc were discussed.   I feel the risks of  no intervention will lead to serious problems that outweigh the operative risks; therefore, I recommended surgery to reduce and repair the hernia.  I explained laparoscopic techniques with possible need for an open approach.  I noted the probable use of mesh to patch and/or buttress the hernia repair  Risks such as bleeding, infection, abscess, need for further treatment, heart attack, death, and other risks were discussed.  I noted a good likelihood this will help address the problem.   Goals of post-operative recovery were discussed as well.  Possibility that this will not correct all symptoms was explained.  I stressed the importance of low-impact activity, aggressive pain control, avoiding constipation, & not pushing through pain to minimize risk of post-operative chronic pain or injury. Possibility of reherniation especially with smoking, obesity, diabetes, immunosuppression, and other health conditions was discussed.  We will work to minimize complications.     An educational handout further explaining the pathology & treatment options was given as well.  Questions were answered.  The patient expresses understanding & wishes to proceed with surgery.

## 2014-07-01 ENCOUNTER — Encounter (HOSPITAL_COMMUNITY): Payer: Self-pay | Admitting: Pharmacy Technician

## 2014-07-06 NOTE — Pre-Procedure Instructions (Signed)
Isabel Barnes  07/06/2014   Your procedure is scheduled on:  August 17  Report to [facility] at 05:30 AM.  Call this number if you have problems the morning of surgery: [phone]   Remember:   Do not eat food or drink liquids after midnight.   Take these medicines the morning of surgery with A SIP OF WATER: Albuterol (if needed), Eye drops, Hydrocodone (if needed), Levothyroxine, Omeprazole, Compazine (if needed), Benadryl (if needed)    STOP Vitamin B12, Tumeric, Red Yeast, Probiotic, Multiple Vitamins, Tart Cherry, Magnesium, Fish Oil, Digestive Enzyme, Vitamin D, Biotin, Oscal,  Aspirin today   STOP/ Do not take Aspirin, Aleve, Naproxen, Advil, Ibuprofen, Motrin, Vitamins, Herbs, or Supplements starting today    Do not wear jewelry, make-up or nail polish.  Do not wear lotions, powders, or perfumes. You may wear deodorant.  Do not shave 48 hours prior to surgery. Men may shave face and neck.  Do not bring valuables to the hospital.  [facility] is not responsible for any belongings or valuables.               Contacts, dentures or bridgework may not be worn into surgery.  Leave suitcase in the car. After surgery it may be brought to your room.  For patients admitted to the hospital, discharge time is determined by your treatment team.               Patients discharged the day of surgery will not be allowed to drive home.  Name and phone number of your driver: Family/ Friend  Special Instructions: See [facility] Preparing For Surgery   Please read over the following fact sheets that you were given: Pain Booklet, Coughing and Deep Breathing and Surgical Site Infection Prevention

## 2014-07-08 ENCOUNTER — Encounter (HOSPITAL_COMMUNITY): Payer: Self-pay

## 2014-07-08 ENCOUNTER — Encounter (HOSPITAL_COMMUNITY)
Admission: RE | Admit: 2014-07-08 | Discharge: 2014-07-08 | Disposition: A | Discharge status: Home / Self Care | Payer: Medicare Other | Source: Ambulatory Visit | Attending: Surgery | Admitting: Surgery

## 2014-07-08 DIAGNOSIS — Z01812 Encounter for preprocedural laboratory examination: Secondary | ICD-10-CM | POA: Insufficient documentation

## 2014-07-08 DIAGNOSIS — Z01810 Encounter for preprocedural cardiovascular examination: Secondary | ICD-10-CM | POA: Insufficient documentation

## 2014-07-08 HISTORY — DX: Shortness of breath: R06.02

## 2014-07-08 HISTORY — DX: Pneumonia, unspecified organism: J18.9

## 2014-07-08 HISTORY — DX: Personal history of urinary calculi: Z87.442

## 2014-07-08 LAB — BASIC METABOLIC PANEL
Anion gap: 11 (ref 5–15)
BUN: 15 mg/dL (ref 6–23)
CO2: 26 mEq/L (ref 19–32)
CREATININE: 0.69 mg/dL (ref 0.50–1.10)
Calcium: 9.2 mg/dL (ref 8.4–10.5)
Chloride: 106 mEq/L (ref 96–112)
GFR calc Af Amer: >90 mL/min (ref >90)
GFR, EST NON AFRICAN AMERICAN: 84 mL/min — AB (ref >90)
GLUCOSE: 85 mg/dL (ref 70–99)
POTASSIUM: 3.9 meq/L (ref 3.7–5.3)
Sodium: 143 mEq/L (ref 137–147)

## 2014-07-08 LAB — CBC
HCT: 37.4 % (ref 36.0–46.0)
Hemoglobin: 12.4 g/dL (ref 12.0–15.0)
MCH: 28.7 pg (ref 26.0–34.0)
MCHC: 33.2 g/dL (ref 30.0–36.0)
MCV: 86.6 fL (ref 78.0–100.0)
Platelets: 281 10*3/uL (ref 150–400)
RBC: 4.32 MIL/uL (ref 3.87–5.11)
RDW: 13.9 % (ref 11.5–15.5)
WBC: 6.8 10*3/uL (ref 4.0–10.5)

## 2014-07-08 NOTE — Pre-Procedure Instructions (Addendum)
Isabel Barnes  07/08/2014   Your procedure is scheduled on:  07/15/14  [facility] at 530 AM.  Call this number if you have problems the morning of surgery: [phone]   Remember:   Do not eat food or drink liquids after midnight.   Take these medicines the morning of surgery with A SIP OF WATER: inhaler,levothyroxine,prilosec,pain med if needed     Take all meds until day of surgery except as instructed below or per dr     Isabel AriasSTOP all herbel meds, nsaids (aleve,naproxen,advil,ibuprofen) 5 days prior to surgery(07/10/14) including all vitamins, ALL over the counter supplements,herbal meds   Do not wear jewelry, make-up or nail polish.  Do not wear lotions, powders, or perfumes. You may wear deodorant.  Do not shave 48 hours prior to surgery. Men may shave face and neck.  Do not bring valuables to the hospital.  [facility] is not responsible                  for any belongings or valuables.               Contacts, dentures or bridgework may not be worn into surgery.  Leave suitcase in the car. After surgery it may be brought to your room.  For patients admitted to the hospital, discharge time is determined by your                treatment team.               Patients discharged the day of surgery will not be allowed to drive  home.  Name and phone number of your driver:   Special Instructions:  Special Instructions: [place] - Preparing for Surgery  Before surgery, you can play an important role.  Because skin is not sterile, your skin needs to be as free of germs as possible.  You can reduce the number of germs on you skin by washing with CHG (chlorahexidine gluconate) soap before surgery.  CHG is an antiseptic cleaner which kills germs and bonds with the skin to continue killing germs even after washing.  Please DO NOT use if you have an allergy to CHG or antibacterial soaps.  If your skin becomes reddened/irritated stop using the CHG and inform your  nurse when you arrive at Short Stay.  Do not shave (including legs and underarms) for at least 48 hours prior to the first CHG shower.  You may shave your face.  Please follow these instructions carefully:   1.  Shower with CHG Soap the night before surgery and the morning of Surgery.  2.  If you choose to wash your hair, wash your hair first as usual with your normal shampoo.  3.  After you shampoo, rinse your hair and body thoroughly to remove the Shampoo.  4.  Use CHG as you would any other liquid soap.  You can apply chg directly  to the skin and wash gently with scrungie or a clean washcloth.  5.  Apply the CHG Soap to your body ONLY FROM THE NECK DOWN.  Do not use on open wounds or open sores.  Avoid contact with your eyes ears, mouth and genitals (private parts).  Wash genitals (private parts)       with your normal soap.  6.  Wash thoroughly, paying special attention to the area where your surgery will be performed.  7.  Thoroughly rinse your body with warm water  from the neck down.  8.  DO NOT shower/wash with your normal soap after using and rinsing off the CHG Soap.  9.  Pat yourself dry with a clean towel.            10.  Wear clean pajamas.            11.  Place clean sheets on your bed the night of your first shower and do not sleep with pets.  Day of Surgery  Do not apply any lotions/deodorants the morning of surgery.  Please wear clean clothes to the hospital/surgery center.   Please read over the following fact sheets that you were given: Pain Booklet, Coughing and Deep Breathing and Surgical Site Infection Prevention

## 2014-07-12 ENCOUNTER — Telehealth (INDEPENDENT_AMBULATORY_CARE_PROVIDER_SITE_OTHER): Payer: Self-pay

## 2014-07-12 NOTE — Telephone Encounter (Signed)
[facility] Dr Michaell CowingGross said ok to proceed with surgery for Monday.

## 2014-07-12 NOTE — Telephone Encounter (Signed)
Message copied by Ethlyn GallerySPILLERS, Rachana Malesky on Fri Jul 12, 2014  4:36 PM ------      Message from: Ardeth SportsmanGROSS, STEVEN C      Created: Thu Jul 11, 2014  7:27 PM       Okay to proceed with surgery since it was just an injection.            ----- Message -----         From: Grace IsaacStephanie Mosher         Sent: 07/10/2014  12:38 PM           To: Ardeth SportsmanSteven C. Gross, MD, Ethlyn GalleryAlisha Maciej Schweitzer, MA            Dr. Michaell CowingGross,      This patient left a message on my VM stating she had a steroid injection yesterday. She is having sx on Monday and wanted to make sure it is ok to proceed. She has some questions. Please call her back.            Steph       ------

## 2014-07-14 MED ORDER — GENTAMICIN IN SALINE 1-0.9 MG/ML-% IV SOLN
100.0000 mg | INTRAVENOUS | Status: AC
  Administered 2014-07-15: 100 mg via INTRAVENOUS
  Filled 2014-07-14: qty 100

## 2014-07-14 MED ORDER — CLINDAMYCIN PHOSPHATE 600 MG/50ML IV SOLN
600.0000 mg | INTRAVENOUS | Status: AC
  Administered 2014-07-15: 600 mg via INTRAVENOUS
  Filled 2014-07-14: qty 50

## 2014-07-15 ENCOUNTER — Encounter (HOSPITAL_COMMUNITY): Payer: Medicare Other | Admitting: Anesthesiology

## 2014-07-15 ENCOUNTER — Encounter (HOSPITAL_COMMUNITY)
Admission: RE | Disposition: A | Discharge status: Home / Self Care | Payer: Self-pay | Source: Ambulatory Visit | Attending: Surgery

## 2014-07-15 ENCOUNTER — Ambulatory Visit (HOSPITAL_COMMUNITY)
Admission: RE | Admit: 2014-07-15 | Discharge: 2014-07-15 | Disposition: A | Discharge status: Home / Self Care | Payer: Medicare Other | Source: Ambulatory Visit | Attending: Surgery | Admitting: Surgery

## 2014-07-15 ENCOUNTER — Ambulatory Visit (HOSPITAL_COMMUNITY): Payer: Medicare Other | Admitting: Anesthesiology

## 2014-07-15 ENCOUNTER — Encounter (HOSPITAL_COMMUNITY): Payer: Self-pay | Admitting: *Deleted

## 2014-07-15 DIAGNOSIS — I10 Essential (primary) hypertension: Secondary | ICD-10-CM | POA: Insufficient documentation

## 2014-07-15 DIAGNOSIS — F40298 Other specified phobia: Secondary | ICD-10-CM | POA: Diagnosis not present

## 2014-07-15 DIAGNOSIS — Z885 Allergy status to narcotic agent status: Secondary | ICD-10-CM | POA: Insufficient documentation

## 2014-07-15 DIAGNOSIS — Z9849 Cataract extraction status, unspecified eye: Secondary | ICD-10-CM | POA: Insufficient documentation

## 2014-07-15 DIAGNOSIS — G894 Chronic pain syndrome: Secondary | ICD-10-CM

## 2014-07-15 DIAGNOSIS — M545 Low back pain, unspecified: Secondary | ICD-10-CM | POA: Diagnosis not present

## 2014-07-15 DIAGNOSIS — E039 Hypothyroidism, unspecified: Secondary | ICD-10-CM | POA: Insufficient documentation

## 2014-07-15 DIAGNOSIS — M159 Polyosteoarthritis, unspecified: Secondary | ICD-10-CM | POA: Insufficient documentation

## 2014-07-15 DIAGNOSIS — Z135 Encounter for screening for eye and ear disorders: Secondary | ICD-10-CM | POA: Diagnosis not present

## 2014-07-15 DIAGNOSIS — Z961 Presence of intraocular lens: Secondary | ICD-10-CM | POA: Diagnosis not present

## 2014-07-15 DIAGNOSIS — Z888 Allergy status to other drugs, medicaments and biological substances status: Secondary | ICD-10-CM | POA: Insufficient documentation

## 2014-07-15 DIAGNOSIS — Z881 Allergy status to other antibiotic agents status: Secondary | ICD-10-CM | POA: Insufficient documentation

## 2014-07-15 DIAGNOSIS — Z96619 Presence of unspecified artificial shoulder joint: Secondary | ICD-10-CM | POA: Insufficient documentation

## 2014-07-15 DIAGNOSIS — K432 Incisional hernia without obstruction or gangrene: Secondary | ICD-10-CM | POA: Insufficient documentation

## 2014-07-15 DIAGNOSIS — Z882 Allergy status to sulfonamides status: Secondary | ICD-10-CM | POA: Diagnosis not present

## 2014-07-15 DIAGNOSIS — Z880 Allergy status to penicillin: Secondary | ICD-10-CM | POA: Diagnosis not present

## 2014-07-15 DIAGNOSIS — Z91018 Allergy to other foods: Secondary | ICD-10-CM | POA: Insufficient documentation

## 2014-07-15 DIAGNOSIS — K5900 Constipation, unspecified: Secondary | ICD-10-CM | POA: Diagnosis not present

## 2014-07-15 DIAGNOSIS — Z91013 Allergy to seafood: Secondary | ICD-10-CM | POA: Diagnosis not present

## 2014-07-15 DIAGNOSIS — G8929 Other chronic pain: Secondary | ICD-10-CM | POA: Insufficient documentation

## 2014-07-15 DIAGNOSIS — K429 Umbilical hernia without obstruction or gangrene: Secondary | ICD-10-CM | POA: Insufficient documentation

## 2014-07-15 DIAGNOSIS — Z9089 Acquired absence of other organs: Secondary | ICD-10-CM | POA: Diagnosis not present

## 2014-07-15 DIAGNOSIS — K219 Gastro-esophageal reflux disease without esophagitis: Secondary | ICD-10-CM | POA: Insufficient documentation

## 2014-07-15 HISTORY — PX: INSERTION OF MESH: SHX5868

## 2014-07-15 HISTORY — PX: UMBILICAL HERNIA REPAIR: SHX196

## 2014-07-15 SURGERY — INSERTION OF MESH
Anesthesia: General | Site: Abdomen

## 2014-07-15 MED ORDER — BUPIVACAINE-EPINEPHRINE (PF) 0.25% -1:200000 IJ SOLN
INTRAMUSCULAR | Status: AC
  Filled 2014-07-15: qty 30

## 2014-07-15 MED ORDER — PANTOPRAZOLE SODIUM 40 MG PO TBEC: 40.0000 mg | DELAYED_RELEASE_TABLET | Freq: Every day | ORAL | Status: DC

## 2014-07-15 MED ORDER — PROPOFOL 10 MG/ML IV BOLUS
INTRAVENOUS | Status: AC
  Filled 2014-07-15: qty 20

## 2014-07-15 MED ORDER — MENTHOL 3 MG MT LOZG: 1.0000 | LOZENGE | OROMUCOSAL | Status: DC | PRN

## 2014-07-15 MED ORDER — LACTATED RINGERS IV SOLN
INTRAVENOUS | Status: DC | PRN
  Administered 2014-07-15 (×2): via INTRAVENOUS

## 2014-07-15 MED ORDER — ALBUTEROL SULFATE HFA 108 (90 BASE) MCG/ACT IN AERS: 2.0000 | INHALATION_SPRAY | RESPIRATORY_TRACT | Status: DC | PRN

## 2014-07-15 MED ORDER — HYDROCODONE-ACETAMINOPHEN 10-325 MG PO TABS: 1.0000 | ORAL_TABLET | ORAL | Status: DC | PRN

## 2014-07-15 MED ORDER — PROMETHAZINE HCL 25 MG/ML IJ SOLN: 6.2500 mg | Freq: Four times a day (QID) | INTRAMUSCULAR | Status: DC | PRN

## 2014-07-15 MED ORDER — FENTANYL CITRATE 0.05 MG/ML IJ SOLN
INTRAMUSCULAR | Status: DC | PRN
  Administered 2014-07-15: 50 ug via INTRAVENOUS
  Administered 2014-07-15: 100 ug via INTRAVENOUS
  Administered 2014-07-15 (×2): 50 ug via INTRAVENOUS

## 2014-07-15 MED ORDER — ACETAMINOPHEN 500 MG PO TABS: 1000.0000 mg | ORAL_TABLET | Freq: Three times a day (TID) | ORAL | Status: DC

## 2014-07-15 MED ORDER — SODIUM CHLORIDE 0.9 % IJ SOLN
INTRAMUSCULAR | Status: AC
  Filled 2014-07-15: qty 10

## 2014-07-15 MED ORDER — CHLORHEXIDINE GLUCONATE 4 % EX LIQD
1.0000 "application " | Freq: Once | CUTANEOUS | Status: DC
  Filled 2014-07-15: qty 15

## 2014-07-15 MED ORDER — DICLOFENAC SODIUM 1 % TD GEL: 2.0000 g | Freq: Four times a day (QID) | TRANSDERMAL | Status: DC | PRN

## 2014-07-15 MED ORDER — ROCURONIUM BROMIDE 100 MG/10ML IV SOLN
INTRAVENOUS | Status: DC | PRN
  Administered 2014-07-15: 50 mg via INTRAVENOUS

## 2014-07-15 MED ORDER — GLYCOPYRROLATE 0.2 MG/ML IJ SOLN
INTRAMUSCULAR | Status: AC
  Filled 2014-07-15: qty 2

## 2014-07-15 MED ORDER — LIDOCAINE HCL (CARDIAC) 20 MG/ML IV SOLN
INTRAVENOUS | Status: AC
  Filled 2014-07-15: qty 5

## 2014-07-15 MED ORDER — 0.9 % SODIUM CHLORIDE (POUR BTL) OPTIME
TOPICAL | Status: DC | PRN
  Administered 2014-07-15: 2000 mL

## 2014-07-15 MED ORDER — NEOSTIGMINE METHYLSULFATE 10 MG/10ML IV SOLN
INTRAVENOUS | Status: DC | PRN
  Administered 2014-07-15: 3 mg via INTRAVENOUS

## 2014-07-15 MED ORDER — LOSARTAN POTASSIUM 25 MG PO TABS: 25.0000 mg | ORAL_TABLET | Freq: Every day | ORAL | Status: DC

## 2014-07-15 MED ORDER — PROPOFOL 10 MG/ML IV BOLUS
INTRAVENOUS | Status: DC | PRN
  Administered 2014-07-15: 140 mg via INTRAVENOUS
  Administered 2014-07-15: 10 mg via INTRAVENOUS

## 2014-07-15 MED ORDER — LEVOTHYROXINE SODIUM 50 MCG PO TABS: 50.0000 ug | ORAL_TABLET | Freq: Every morning | ORAL | Status: DC

## 2014-07-15 MED ORDER — PHENOL 1.4 % MT LIQD: 2.0000 | OROMUCOSAL | Status: DC | PRN

## 2014-07-15 MED ORDER — LACTATED RINGERS IV BOLUS (SEPSIS): 1000.0000 mL | Freq: Three times a day (TID) | INTRAVENOUS | Status: DC | PRN

## 2014-07-15 MED ORDER — GLYCOPYRROLATE 0.2 MG/ML IJ SOLN
INTRAMUSCULAR | Status: AC
  Filled 2014-07-15: qty 1

## 2014-07-15 MED ORDER — DEXAMETHASONE SODIUM PHOSPHATE 4 MG/ML IJ SOLN
INTRAMUSCULAR | Status: DC | PRN
  Administered 2014-07-15: 4 mg via INTRAVENOUS

## 2014-07-15 MED ORDER — FENTANYL CITRATE 0.05 MG/ML IJ SOLN
INTRAMUSCULAR | Status: AC
  Administered 2014-07-15: 50 ug via INTRAVENOUS
  Filled 2014-07-15: qty 2

## 2014-07-15 MED ORDER — LIP MEDEX EX OINT: 1.0000 "application " | TOPICAL_OINTMENT | Freq: Two times a day (BID) | CUTANEOUS | Status: DC

## 2014-07-15 MED ORDER — GLYCOPYRROLATE 0.2 MG/ML IJ SOLN
INTRAMUSCULAR | Status: DC | PRN
  Administered 2014-07-15: 0.4 mg via INTRAVENOUS

## 2014-07-15 MED ORDER — LORAZEPAM 2 MG/ML IJ SOLN: 0.5000 mg | Freq: Three times a day (TID) | INTRAMUSCULAR | Status: DC | PRN

## 2014-07-15 MED ORDER — SUCCINYLCHOLINE CHLORIDE 20 MG/ML IJ SOLN
INTRAMUSCULAR | Status: AC
  Filled 2014-07-15: qty 1

## 2014-07-15 MED ORDER — METHOCARBAMOL 1000 MG/10ML IJ SOLN: 500.0000 mg | Freq: Four times a day (QID) | INTRAVENOUS | Status: DC | PRN

## 2014-07-15 MED ORDER — BUPIVACAINE-EPINEPHRINE 0.25% -1:200000 IJ SOLN
INTRAMUSCULAR | Status: DC | PRN
  Administered 2014-07-15: 60 mL

## 2014-07-15 MED ORDER — ALUM & MAG HYDROXIDE-SIMETH 200-200-20 MG/5ML PO SUSP: 30.0000 mL | Freq: Four times a day (QID) | ORAL | Status: DC | PRN

## 2014-07-15 MED ORDER — MIDAZOLAM HCL 2 MG/2ML IJ SOLN
INTRAMUSCULAR | Status: AC
  Filled 2014-07-15: qty 2

## 2014-07-15 MED ORDER — FENTANYL CITRATE 0.05 MG/ML IJ SOLN
INTRAMUSCULAR | Status: AC
  Filled 2014-07-15: qty 5

## 2014-07-15 MED ORDER — PROCHLORPERAZINE MALEATE 10 MG PO TABS: 10.0000 mg | ORAL_TABLET | Freq: Three times a day (TID) | ORAL | Status: DC | PRN

## 2014-07-15 MED ORDER — FENTANYL CITRATE 0.05 MG/ML IJ SOLN: 25.0000 ug | INTRAMUSCULAR | Status: DC | PRN

## 2014-07-15 MED ORDER — VITAMIN D3 25 MCG (1000 UNIT) PO TABS: 1000.0000 [IU] | ORAL_TABLET | Freq: Every day | ORAL | Status: DC

## 2014-07-15 MED ORDER — SODIUM CHLORIDE 0.9 % IJ SOLN: 3.0000 mL | INTRAMUSCULAR | Status: DC | PRN

## 2014-07-15 MED ORDER — ONDANSETRON HCL 4 MG/2ML IJ SOLN
INTRAMUSCULAR | Status: AC
  Filled 2014-07-15: qty 2

## 2014-07-15 MED ORDER — CARBOXYMETHYLCELLULOSE SODIUM 0.5 % OP SOLN: 1.0000 [drp] | Freq: Every day | OPHTHALMIC | Status: DC

## 2014-07-15 MED ORDER — ONDANSETRON HCL 4 MG/2ML IJ SOLN
INTRAMUSCULAR | Status: DC | PRN
  Administered 2014-07-15: 4 mg via INTRAVENOUS

## 2014-07-15 MED ORDER — NEOSTIGMINE METHYLSULFATE 10 MG/10ML IV SOLN
INTRAVENOUS | Status: AC
  Filled 2014-07-15: qty 1

## 2014-07-15 MED ORDER — KETOROLAC TROMETHAMINE 15 MG/ML IJ SOLN
INTRAMUSCULAR | Status: DC | PRN
  Administered 2014-07-15: 15 mg via INTRAVENOUS

## 2014-07-15 MED ORDER — FENTANYL CITRATE 0.05 MG/ML IJ SOLN
25.0000 ug | INTRAMUSCULAR | Status: DC | PRN
  Administered 2014-07-15 (×4): 50 ug via INTRAVENOUS

## 2014-07-15 MED ORDER — ROCURONIUM BROMIDE 50 MG/5ML IV SOLN
INTRAVENOUS | Status: AC
  Filled 2014-07-15: qty 1

## 2014-07-15 MED ORDER — DEXAMETHASONE SODIUM PHOSPHATE 4 MG/ML IJ SOLN
INTRAMUSCULAR | Status: AC
  Filled 2014-07-15: qty 1

## 2014-07-15 MED ORDER — METOPROLOL TARTRATE 1 MG/ML IV SOLN: 5.0000 mg | Freq: Four times a day (QID) | INTRAVENOUS | Status: DC | PRN

## 2014-07-15 MED ORDER — ONDANSETRON HCL 4 MG/2ML IJ SOLN: 4.0000 mg | Freq: Four times a day (QID) | INTRAMUSCULAR | Status: DC | PRN

## 2014-07-15 MED ORDER — CALCIUM CARBONATE-VITAMIN D 500-200 MG-UNIT PO TABS: 1.0000 | ORAL_TABLET | Freq: Every morning | ORAL | Status: DC

## 2014-07-15 MED ORDER — LIDOCAINE HCL (CARDIAC) 20 MG/ML IV SOLN
INTRAVENOUS | Status: DC | PRN
  Administered 2014-07-15: 80 mg via INTRAVENOUS

## 2014-07-15 MED ORDER — ONDANSETRON 8 MG/NS 50 ML IVPB: 8.0000 mg | Freq: Four times a day (QID) | INTRAVENOUS | Status: DC | PRN

## 2014-07-15 MED ORDER — PHENYLEPHRINE 40 MCG/ML (10ML) SYRINGE FOR IV PUSH (FOR BLOOD PRESSURE SUPPORT)
PREFILLED_SYRINGE | INTRAVENOUS | Status: AC
  Filled 2014-07-15: qty 10

## 2014-07-15 MED ORDER — DIPHENHYDRAMINE HCL 25 MG PO TABS: 50.0000 mg | ORAL_TABLET | Freq: Four times a day (QID) | ORAL | Status: DC | PRN

## 2014-07-15 MED ORDER — ASPIRIN EC 81 MG PO TBEC: 81.0000 mg | DELAYED_RELEASE_TABLET | Freq: Every morning | ORAL | Status: DC

## 2014-07-15 MED ORDER — METHOCARBAMOL 500 MG PO TABS: 500.0000 mg | ORAL_TABLET | Freq: Four times a day (QID) | ORAL | Status: DC | PRN

## 2014-07-15 MED ORDER — ONDANSETRON HCL 4 MG/2ML IJ SOLN: 4.0000 mg | Freq: Once | INTRAMUSCULAR | Status: DC | PRN

## 2014-07-15 MED ORDER — MIDAZOLAM HCL 5 MG/5ML IJ SOLN
INTRAMUSCULAR | Status: DC | PRN
  Administered 2014-07-15: 1 mg via INTRAVENOUS

## 2014-07-15 MED ORDER — SODIUM CHLORIDE 0.9 % IJ SOLN: 3.0000 mL | Freq: Two times a day (BID) | INTRAMUSCULAR | Status: DC

## 2014-07-15 MED ORDER — EPHEDRINE SULFATE 50 MG/ML IJ SOLN
INTRAMUSCULAR | Status: AC
  Filled 2014-07-15: qty 1

## 2014-07-15 MED ORDER — MAGIC MOUTHWASH: 15.0000 mL | Freq: Four times a day (QID) | ORAL | Status: DC | PRN

## 2014-07-15 MED ORDER — DIPHENHYDRAMINE HCL 50 MG/ML IJ SOLN: 12.5000 mg | Freq: Four times a day (QID) | INTRAMUSCULAR | Status: DC | PRN

## 2014-07-15 SURGICAL SUPPLY — 56 items
APPLICATOR COTTON TIP 6IN STRL (MISCELLANEOUS) IMPLANT
APPLIER CLIP LOGIC TI 5 (MISCELLANEOUS) IMPLANT
BINDER ABD UNIV 10 28-50 (GAUZE/BANDAGES/DRESSINGS) ×2 IMPLANT
BINDER ABDOM UNIV 10 (GAUZE/BANDAGES/DRESSINGS) ×3
BINDER ABDOMINAL 12 ML 46-62 (SOFTGOODS) ×3 IMPLANT
BLADE SURG ROTATE 9660 (MISCELLANEOUS) ×3 IMPLANT
CANISTER SUCTION 2500CC (MISCELLANEOUS) IMPLANT
CHLORAPREP W/TINT 26ML (MISCELLANEOUS) ×3 IMPLANT
COVER SURGICAL LIGHT HANDLE (MISCELLANEOUS) ×3 IMPLANT
DEVICE SECURE STRAP 25 ABSORB (INSTRUMENTS) ×3 IMPLANT
DEVICE TROCAR PUNCTURE CLOSURE (ENDOMECHANICALS) ×3 IMPLANT
DRAPE UTILITY 15X26 W/TAPE STR (DRAPE) ×6 IMPLANT
DRAPE WARM FLUID 44X44 (DRAPE) ×3 IMPLANT
DRSG TEGADERM 2-3/8X2-3/4 SM (GAUZE/BANDAGES/DRESSINGS) ×9 IMPLANT
DRSG TEGADERM 4X4.75 (GAUZE/BANDAGES/DRESSINGS) ×3 IMPLANT
ELECT REM PT RETURN 9FT ADLT (ELECTROSURGICAL) ×3
ELECTRODE REM PT RTRN 9FT ADLT (ELECTROSURGICAL) ×2 IMPLANT
GAUZE SPONGE 2X2 8PLY STRL LF (GAUZE/BANDAGES/DRESSINGS) ×2 IMPLANT
GLOVE BIOGEL PI IND STRL 7.0 (GLOVE) ×6 IMPLANT
GLOVE BIOGEL PI IND STRL 8 (GLOVE) ×2 IMPLANT
GLOVE BIOGEL PI INDICATOR 7.0 (GLOVE) ×3
GLOVE BIOGEL PI INDICATOR 8 (GLOVE) ×1
GLOVE ECLIPSE 8.0 STRL XLNG CF (GLOVE) IMPLANT
GLOVE SURG SS PI 7.0 STRL IVOR (GLOVE) ×15 IMPLANT
GLOVE SURG SS PI 8.0 STRL IVOR (GLOVE) ×3 IMPLANT
GOWN STRL REUS W/ TWL LRG LVL3 (GOWN DISPOSABLE) ×6 IMPLANT
GOWN STRL REUS W/ TWL XL LVL3 (GOWN DISPOSABLE) ×2 IMPLANT
GOWN STRL REUS W/TWL LRG LVL3 (GOWN DISPOSABLE) ×6
GOWN STRL REUS W/TWL XL LVL3 (GOWN DISPOSABLE) ×1
KIT BASIN OR (CUSTOM PROCEDURE TRAY) ×3 IMPLANT
KIT ROOM TURNOVER OR (KITS) ×3 IMPLANT
MARKER SKIN DUAL TIP RULER LAB (MISCELLANEOUS) ×3 IMPLANT
MESH VENTRALIGHT ST 6X8 (Mesh Specialty) ×1 IMPLANT
MESH VENTRLGHT ELLIPSE 8X6XMFL (Mesh Specialty) ×2 IMPLANT
NEEDLE 22X1 1/2 (OR ONLY) (NEEDLE) ×3 IMPLANT
NEEDLE SPNL 22GX3.5 QUINCKE BK (NEEDLE) ×3 IMPLANT
NS IRRIG 1000ML POUR BTL (IV SOLUTION) ×6 IMPLANT
PAD ARMBOARD 7.5X6 YLW CONV (MISCELLANEOUS) ×6 IMPLANT
SCALPEL HARMONIC ACE (MISCELLANEOUS) IMPLANT
SCISSORS LAP 5X35 DISP (ENDOMECHANICALS) IMPLANT
SET IRRIG TUBING LAPAROSCOPIC (IRRIGATION / IRRIGATOR) IMPLANT
SLEEVE ENDOPATH XCEL 5M (ENDOMECHANICALS) ×6 IMPLANT
SLEEVE SURGEON STRL (DRAPES) IMPLANT
SPONGE GAUZE 2X2 STER 10/PKG (GAUZE/BANDAGES/DRESSINGS) ×1
STRIP CLOSURE SKIN 1/2X4 (GAUZE/BANDAGES/DRESSINGS) ×3 IMPLANT
SUT MNCRL AB 4-0 PS2 18 (SUTURE) ×3 IMPLANT
SUT PDS AB 0 CT 36 (SUTURE) ×6 IMPLANT
SUT PROLENE 1 CT (SUTURE) ×24 IMPLANT
SUT VICRYL 0 UR6 27IN ABS (SUTURE) IMPLANT
TACKER 5MM HERNIA 3.5CML NAB (ENDOMECHANICALS) IMPLANT
TOWEL OR 17X24 6PK STRL BLUE (TOWEL DISPOSABLE) ×3 IMPLANT
TOWEL OR 17X26 10 PK STRL BLUE (TOWEL DISPOSABLE) ×3 IMPLANT
TRAY FOLEY CATH 16FR SILVER (SET/KITS/TRAYS/PACK) IMPLANT
TRAY LAPAROSCOPIC (CUSTOM PROCEDURE TRAY) ×3 IMPLANT
TROCAR XCEL NON-BLD 11X100MML (ENDOMECHANICALS) ×3 IMPLANT
TROCAR XCEL NON-BLD 5MMX100MML (ENDOMECHANICALS) ×3 IMPLANT

## 2014-07-15 NOTE — H&P (Signed)
[facility]  [address]  [place], [place] [id number] Phone: [phone] FAX: [phone]     Isabel Pertatricia W Pizzimenti  03/24/1941 [id number]  CARE TEAM:  PCP: Toniann FailHAWKS,AL N, MD  Outpatient Care Team: Patient Care Team: Al N. Lendon ColonelHawks, MD as PCP - General (Family Medicine) Jamal CollinSuzanne Hedgecock, PA-C as Physician Assistant (Physician Assistant)  Inpatient Treatment Team: Treatment Team: Attending Provider: Ardeth SportsmanSteven C. Kaniel Kiang, MD  This patient is a 73 y.o.female who presents today for surgery.   Pleasant female. Has had hernia at her bellybutton for several years. Has not really bothered her. However, she required steroids earlier in the year. Markedly increased appetite. Gained weight. Has noticed the hernia at her bellybutton has gotten larger. It does become more sensitive. No nausea or vomiting. He did concern are low. Mentioned to her primary care team. Surgical consultation recommended.   She struggles with chronic low back pain. On chronic narcotics and pain medications. Occasional constipation but has a bowel movement every day with a bowel regimen. She can walk about 20 minutes before having to stop secondary to back and hip pain. No history of cardiac or pulmonary disease. Has not smoked in 20 years.   Past Medical History  Diagnosis Date  . Hypothyroidism   . Chronic pain disorder   . Osteoarthritis     lumbar,cervical,joints  . Spondylitis   . Complication of anesthesia     severe claustrophobia  . Spinal headache 1991    blood patch placed  . Hypertension   . History of recurrent UTI (urinary tract infection)   . GERD (gastroesophageal reflux disease)   . Umbilical hernia     to see surgeon next week  . Shortness of breath     hx  not used  inhaler for 2-3 months  . Pneumonia     hx  . History of kidney stones   . Anemia     iron deficiency hx    Past Surgical History  Procedure Laterality Date  . Abdominal hysterectomy     . Cholecystectomy    . Joint replacement Right 2012    shoulder  . Shoulder arthroscopy w/ rotator cuff repair Bilateral three times each over several yrs  . Cystoscopy w/ ureteroscopy  2012  . Thumb arthroscopy Left   . Nasal sinus surgery    . Anterior fusion cervical spine      x2 -C4-7  . Eye surgery Bilateral     cataract /lens implant  . Holmium laser application Left 02/08/2013    Procedure: HOLMIUM LASER APPLICATION;  Surgeon: Anner CreteJohn J Wrenn, MD;  [facility];  Service: Urology;  Laterality: Left;  . Radiology with anesthesia N/A 05/09/2014    Procedure: ADULT SEDATION WITH ANESTHESIA/MRI CERVICAL SPINE WITHOUT CONTRAST;  Surgeon: Medication Radiologist, MD;  Location: [facility] OR;  Service: Radiology;  Laterality: N/A;  DR. HAWKS/MRI    History   Social History  . Marital Status: Divorced    Spouse Name: N/A    Number of Children: N/A  . Years of Education: N/A   Occupational History  . Not on file.   Social History Main Topics  . Smoking status: Former Smoker -- 0.50 packs/day for 2 years    Types: Cigarettes    Quit date: 02/07/1992  . Smokeless tobacco: Never Used  . Alcohol Use: No  . Drug Use: No  . Sexual Activity: Not on file   Other Topics Concern  . Not  on file   Social History Narrative  . No narrative on file    Family History  Problem Relation Age of Onset  . Stroke Father     Current Facility-Administered Medications  Medication Dose Route Frequency Provider Last Rate Last Dose  . chlorhexidine (HIBICLENS) 4 % liquid 1 application  1 application Topical Once Ardeth Sportsman, MD      . Melene Muller ON 07/16/2014] chlorhexidine (HIBICLENS) 4 % liquid 1 application  1 application Topical Once Ardeth Sportsman, MD      . clindamycin (CLEOCIN) IVPB 600 mg  600 mg Intravenous On Call to OR Ardeth Sportsman, MD      . gentamicin (GARAMYCIN) IVPB 100 mg  100 mg Intravenous On Call to OR Ardeth Sportsman, MD         Allergies  Allergen  Reactions  . Dilaudid [Hydromorphone Hcl] Shortness Of Breath  . Gabapentin Other (See Comments)    Hoarseness , headache and sore throat  . Lyrica [Pregabalin] Other (See Comments)    No balance , had to walk with cane , Blurred vision,weakness.  . Singulair [Montelukast Sodium] Shortness Of Breath  . Banana Other (See Comments)    Abdominal cramping Cantaloupe also  . Ciprofloxacin Hcl Nausea And Vomiting and Other (See Comments)    Nausea and vomiting in po form  . Codeine Other (See Comments)    hallucinations  . Methadone Nausea And Vomiting    severe  . Metronidazole Nausea And Vomiting and Other (See Comments)    Gastric pain  . Oysters [Shellfish Allergy] Other (See Comments)    "Terrible gastric upset and cramping."  . Betadine [Povidone Iodine] Rash and Other (See Comments)    Oyster shell products-rash  . Latex Rash  . Nucynta [Tapentadol] Other (See Comments)    nightmares  . Other Rash    All Antibiotic ointments/ creams  . Penicillins Nausea And Vomiting and Rash  . Sulfur Nausea And Vomiting    ROS: Constitutional:  No fevers, chills, sweats.  Weight stable Eyes:  No vision changes, No discharge HENT:  No sore throats, nasal drainage Lymph: No neck swelling, No bruising easily Pulmonary:  No cough, productive sputum CV: No orthopnea, PND  No exertional chest/neck/shoulder/arm pain. GI: No personal nor family history of GI/colon cancer, inflammatory bowel disease, irritable bowel syndrome, allergy such as Celiac Sprue, dietary/dairy problems, colitis, ulcers nor gastritis.  No recent sick contacts/gastroenteritis.  No travel outside the country.  No changes in diet. Renal: No UTIs, No hematuria Genital:  No drainage, bleeding, masses Musculoskeletal: No severe joint pain.  Good ROM major joints Skin:  No sores or lesions.  No rashes Heme/Lymph:  No easy bleeding.  No swollen lymph nodes Neuro: No focal weakness/numbness.  No seizures Psych: No suicidal  ideation.  No hallucinations  BP 158/72  Pulse 72  Temp(Src) 98.2 F (36.8 C) (Oral)  Resp 20  Ht 5' 2.5" (1.588 m)  Wt 141 lb (63.957 kg)  BMI 25.36 kg/m2  SpO2 99%  Physical Exam: General: Pt awake/alert/oriented x4 in no major acute distress Eyes: PERRL, normal EOM. Sclera nonicteric Neuro: CN II-XII intact w/o focal sensory/motor deficits. Lymph: No head/neck/groin lymphadenopathy Psych:  No delerium/psychosis/paranoia HENT: Normocephalic, Mucus membranes moist.  No thrush Neck: Supple, No tracheal deviation Chest: No pain.  Good respiratory excursion. CV:  Pulses intact.  Regular rhythm Abdomen: Soft, Nondistended.  Nontender.  3 cm incarcerated periumbilical VWHernia Ext:  SCDs BLE.  No significant edema.  No cyanosis Skin: No petechiae / purpurea.  No major sores Musculoskeletal: No severe joint pain.  Good ROM major joints   Results:   Labs: No results found for this or any previous visit (from the past 48 hour(s)).  Imaging / Studies: No results found.  Medications / Allergies: per chart  Antibiotics: Anti-infectives   Start     Dose/Rate Route Frequency Ordered Stop   07/15/14 0600  clindamycin (CLEOCIN) IVPB 600 mg    Comments:  Pharmacy may adjust dosing strength, interval, or rate of medication as needed for optimal therapy for the patient Send with patient on call to the OR.  Anesthesia to complete antibiotic administration <100min prior to incision per [facility].   600 mg 100 mL/hr over 30 Minutes Intravenous On call to O.R. 07/14/14 1408 07/16/14 0559   07/15/14 0600  gentamicin (GARAMYCIN) IVPB 100 mg    Comments:  Pharmacy may adjust dosing strength, schedule, rate of infusion, etc as needed to optimize therapy Send with patient on call to the OR.  Anesthesia to complete antibiotic administration <43min prior to incision per [facility].   100 mg 200 mL/hr over 30 Minutes Intravenous On call to O.R. 07/14/14 1408 07/16/14 0559       Assessment  Isabel Barnes  72 y.o. female  Day of Surgery  Procedure(s): LAPAROSCOPIC VENTRAL WALL HERNIA REPAIR INSERTION OF MESH  Problem List:  Active Problems:   * No active hospital problems. *   [facility]  Plan:  Lap repair:  The anatomy & physiology of the abdominal wall was discussed.  The pathophysiology of hernias was discussed.  Natural history risks without surgery including progeressive enlargement, pain, incarceration & strangulation was discussed.   Contributors to complications such as smoking, obesity, diabetes, prior surgery, etc were discussed.   I feel the risks of no intervention will lead to serious problems that outweigh the operative risks; therefore, I recommended surgery to reduce and repair the hernia.  I explained laparoscopic techniques with possible need for an open approach.  I noted the probable use of mesh to patch and/or buttress the hernia repair  Risks such as bleeding, infection, abscess, need for further treatment, heart attack, death, and other risks were discussed.  I noted a good likelihood this will help address the problem.   Goals of post-operative recovery were discussed as well.  Possibility that this will not correct all symptoms was explained.  I stressed the importance of low-impact activity, aggressive pain control, avoiding constipation, & not pushing through pain to minimize risk of post-operative chronic pain or injury. Possibility of reherniation especially with smoking, obesity, diabetes, immunosuppression, and other health conditions was discussed.  We will work to minimize complications.     An educational handout further explaining the pathology & treatment options was given as well.  Questions were answered.  The patient expresses understanding & wishes to proceed with surgery.    -VTE prophylaxis- SCDs, etc -mobilize as tolerated to help recovery    Ardeth Sportsman, M.D., F.A.C.S. Gastrointestinal and Minimally Invasive  Surgery [facility] [address] [place], [place] [address] [phone] Main / Paging   07/15/2014  Note: Portions of this report may have been transcribed using voice recognition software. Every effort was made to ensure accuracy; however, inadvertent computerized transcription errors may be present.   Any transcriptional errors that result from this process are unintentional.

## 2014-07-15 NOTE — Transfer of Care (Signed)
Immediate Anesthesia Transfer of Care Note  Patient: Isabel Barnes  Procedure(s) Performed: Procedure(s): INSERTION OF MESH (N/A) LAPAROSCOPIC UMBILICAL AND INFRAUMBILICAL HERNIA (N/A)  Patient Location: PACU  Anesthesia Type:General  Level of Consciousness: awake, oriented and patient cooperative  Airway & Oxygen Therapy: Patient Spontanous Breathing and Patient connected to nasal cannula oxygen  Post-op Assessment: Report given to PACU RN and Post -op Vital signs reviewed and stable  Post vital signs: Reviewed  Complications: No apparent anesthesia complications

## 2014-07-15 NOTE — Anesthesia Postprocedure Evaluation (Signed)
  Anesthesia Post-op Note  Patient: Isabel Barnes  Procedure(s) Performed: Procedure(s): INSERTION OF MESH (N/A) LAPAROSCOPIC UMBILICAL AND INFRAUMBILICAL HERNIA (N/A)  Patient Location: PACU  Anesthesia Type: General   Level of Consciousness: awake, alert  and oriented  Airway and Oxygen Therapy: Patient Spontanous Breathing  Post-op Pain: mild  Post-op Assessment: Post-op Vital signs reviewed  Post-op Vital Signs: Reviewed  Last Vitals:  Filed Vitals:   07/15/14 1015  BP: 149/59  Pulse: 87  Temp:   Resp: 13    Complications: No apparent anesthesia complications

## 2014-07-15 NOTE — Op Note (Signed)
07/15/2014  9:34 AM  PATIENT:  Isabel Barnes  73 y.o. female  Patient Care Team: Al N. Lendon Colonel, MD as PCP - General (Family Medicine) Jamal Collin, PA-C as Physician Assistant (Physician Assistant)  PRE-OPERATIVE DIAGNOSIS:  umbilical incisional abdominal wall hernia  POST-OPERATIVE DIAGNOSIS:  umbilical and incisional infraumbilical hernias  PROCEDURE:  Procedure(s): INSERTION OF MESH LAPAROSCOPIC UMBILICAL AND INFRAUMBILICAL INCISIONAL HERNIA REPAIRS  SURGEON:  Surgeon(s): Ardeth Sportsman, MD  ASSISTANT: RN   ANESTHESIA:   local and general  EBL:  Total I/O In: 1000 [I.V.:1000] Out: -   Delay start of Pharmacological VTE agent (>24hrs) due to surgical blood loss or risk of bleeding:  no  DRAINS: none   SPECIMEN:  No Specimen  DISPOSITION OF SPECIMEN:  N/A  COUNTS:  YES  PLAN OF CARE: Discharge to home after PACU  PATIENT DISPOSITION:  PACU - hemodynamically stable.  INDICATION: Pleasant patient has developed a ventral wall abdominal hernia.   Recommendation was made for surgical repair:  The anatomy & physiology of the abdominal wall was discussed. The pathophysiology of hernias was discussed. Natural history risks without surgery including progeressive enlargement, pain, incarceration & strangulation was discussed. Contributors to complications such as smoking, obesity, diabetes, prior surgery, etc were discussed.  I feel the risks of no intervention will lead to serious problems that outweigh the operative risks; therefore, I recommended surgery to reduce and repair the hernia. I explained laparoscopic techniques with possible need for an open approach. I noted the probable use of mesh to patch and/or buttress the hernia repair  Risks such as bleeding, infection, abscess, need for further treatment, heart attack, death, and other risks were discussed. I noted a good likelihood this will help address the problem. Goals of post-operative recovery were discussed  as well. Possibility that this will not correct all symptoms was explained. I stressed the importance of low-impact activity, aggressive pain control, avoiding constipation, & not pushing through pain to minimize risk of post-operative chronic pain or injury. Possibility of reherniation especially with smoking, obesity, diabetes, immunosuppression, and other health conditions was discussed. We will work to minimize complications.  An educational handout further explaining the pathology & treatment options was given as well. Questions were answered. The patient expresses understanding & wishes to proceed with surgery.   OR FINDINGS: Umbilical 1cm hernia.  3x2.5cm infraumbilical incisional hernia.  Omentum within hernias partially   Type of repair - Primary & Laparoscopic underlay repairs   Name of mesh - Bard Ventralight dual sided (polypropylene / Seprafilm)  Size of mesh - Length 20 cm, Width 15 cm  Mesh overlap - 5-7 cm  Placement of mesh - Intraperitoneal underlay repair   DESCRIPTION:   Informed consent was confirmed. The patient underwent general anaesthesia without difficulty. The patient was positioned appropriately. VTE prevention in place. The patient's abdomen was clipped, prepped, & draped in a sterile fashion. Surgical timeout confirmed our plan.  The patient was positioned in reverse Trendelenburg. Abdominal entry was gained using optical entry technique in the left upper abdomen. Entry was clean. I induced carbon dioxide insufflation. Camera inspection revealed no injury. Extra ports were carefully placed under direct laparoscopic visualization.   I could see adhesions to the central anterior abdominal wall the hernia in the abdomen.   I did laparoscopic lysis of adhesions to expose the entire anterior abdominal wall.  I made sure hemostasis was good.  I noted small umbilical & larger infraumbilical hernias.  I mapped out the region using a  needle passer.   To ensure that I would  have at least 5 cm radial coverage outside of the hernia defect, I chose a 20x15 cm dual sided mesh.  I placed #1 Prolene stitches around its edge about every 5 cm = 14 total.  I rolled the mesh & placed into the peritoneal cavity through the infraumbilical fascial defect.  I unrolled the mesh and positioned it appropriately.  I secured the mesh to cover up the hernia defect using a laparoscopic suture passer to pass the tails of the Prolene through the abdominal wall & tagged them with clamps.  I started out in four corners to make sure I had the mesh centered over the hernia defect appropriately, and then proceeded to work in quadrants.  We evacuated CO2 & desufflated the abdomen.  I closed the infraumbilical incisional hernia primarily using 0 PDS interrupted sutures.   I tied the fascial stitches down.  I reinsufflated the abdomen.  The mesh provided at least 5-10 cm circumferential coverage around the entire region of hernia defects.   I tacked the edges & central part of the mesh to the peritoneum/posterior rectus fascia with  SecureStrap absorbable tacks.   Hemostasis was excellent.  I did reinspection. Hemostasis was good. Mesh laid well. Capnoperitoneum was evacuated. Ports were removed. The skin was closed with Monocryl at the port sites and Steri-Strips on the fascial stitch puncture sites.  Patient is being extubated to go to the recovery room. Prior to surgery I had made recommendations to the patient & her daughter.  I answered questions.  Understanding & appreciation was expressed.  The daughter has left the hospital & did not wish to be called at this time (according to the hospital volunteer).

## 2014-07-15 NOTE — Anesthesia Procedure Notes (Signed)
Procedure Name: Intubation Date/Time: 07/15/2014 7:59 AM Performed by: Lovie CholOCK, Bennye Nix K Pre-anesthesia Checklist: Patient identified, Emergency Drugs available, Suction available, Patient being monitored and Timeout performed Patient Re-evaluated:Patient Re-evaluated prior to inductionOxygen Delivery Method: Circle system utilized Preoxygenation: Pre-oxygenation with 100% oxygen Intubation Type: IV induction Ventilation: Mask ventilation without difficulty and Oral airway inserted - appropriate to patient size Laryngoscope Size: Miller and 2 Grade View: Grade I Tube type: Oral Tube size: 7.0 mm Number of attempts: 1 Airway Equipment and Method: Stylet Placement Confirmation: ETT inserted through vocal cords under direct vision,  positive ETCO2 and breath sounds checked- equal and bilateral Secured at: 21 cm Tube secured with: Tape Dental Injury: Teeth and Oropharynx as per pre-operative assessment

## 2014-07-15 NOTE — Discharge Instructions (Signed)
HERNIA REPAIR: POST OP INSTRUCTIONS ° °1. DIET: Follow a light bland diet the first 24 hours after arrival home, such as soup, liquids, crackers, etc.  Be sure to include lots of fluids daily.  Avoid fast food or heavy meals as your are more likely to get nauseated.  Eat a low fat the next few days after surgery. °2. Take your usually prescribed home medications unless otherwise directed. °3. PAIN CONTROL: °a. Pain is best controlled by a usual combination of three different methods TOGETHER: °i. Ice/Heat °ii. Over the counter pain medication °iii. Prescription pain medication °b. Most patients will experience some swelling and bruising around the hernia(s) such as the bellybutton, groins, or old incisions.  Ice packs or heating pads (30-60 minutes up to 6 times a day) will help. Use ice for the first few days to help decrease swelling and bruising, then switch to heat to help relax tight/sore spots and speed recovery.  Some people prefer to use ice alone, heat alone, alternating between ice & heat.  Experiment to what works for you.  Swelling and bruising can take several weeks to resolve.   °c. It is helpful to take an over-the-counter pain medication regularly for the first few weeks.  Choose one of the following that works best for you: °i. Naproxen (Aleve, etc)  Two 220mg tabs twice a day °ii. Ibuprofen (Advil, etc) Three 200mg tabs four times a day (every meal & bedtime) °iii. Acetaminophen (Tylenol, etc) 325-650mg four times a day (every meal & bedtime) °d. A  prescription for pain medication should be given to you upon discharge.  Take your pain medication as prescribed.  °i. If you are having problems/concerns with the prescription medicine (does not control pain, nausea, vomiting, rash, itching, etc), please call us [phone] to see if we need to switch you to a different pain medicine that will work better for you and/or control your side effect better. °ii. If you need a refill on your pain  medication, please contact your pharmacy.  They will contact our office to request authorization. Prescriptions will not be filled after 5 pm or on week-ends. °4. Avoid getting constipated.  Between the surgery and the pain medications, it is common to experience some constipation.  Increasing fluid intake and taking a fiber supplement (such as Metamucil, Citrucel, FiberCon, MiraLax, etc) 1-2 times a day regularly will usually help prevent this problem from occurring.  A mild laxative (prune juice, Milk of Magnesia, MiraLax, etc) should be taken according to package directions if there are no bowel movements after 48 hours.   °5. Wash / shower every day.  You may shower over the dressings as they are waterproof.   °6. Remove your waterproof bandages 5 days after surgery.  You may leave the incision open to air.  You may replace a dressing/Band-Aid to cover the incision for comfort if you wish.  Continue to shower over incision(s) after the dressing is off. ° ° ° °7. ACTIVITIES as tolerated:   °a. You may resume regular (light) daily activities beginning the next day--such as daily self-care, walking, climbing stairs--gradually increasing activities as tolerated.  If you can walk 30 minutes without difficulty, it is safe to try more intense activity such as jogging, treadmill, bicycling, low-impact aerobics, swimming, etc. °b. Save the most intensive and strenuous activity for last such as sit-ups, heavy lifting, contact sports, etc  Refrain from any heavy lifting or straining until you are off narcotics for pain control.   °  c. DO NOT PUSH THROUGH PAIN.  Let pain be your guide: If it hurts to do something, don't do it.  Pain is your body warning you to avoid that activity for another week until the pain goes down. °d. You may drive when you are no longer taking prescription pain medication, you can comfortably wear a seatbelt, and you can safely maneuver your car and apply brakes. °e. You may have sexual intercourse  when it is comfortable.  °8. FOLLOW UP in our office °a. Please call [facility] at [phone] to set up an appointment to see your surgeon in the office for a follow-up appointment approximately 2-3 weeks after your surgery. °b. Make sure that you call for this appointment the day you arrive home to insure a convenient appointment time. °9.  IF YOU HAVE DISABILITY OR FAMILY LEAVE FORMS, BRING THEM TO THE OFFICE FOR PROCESSING.  DO NOT GIVE THEM TO YOUR DOCTOR. ° °WHEN TO CALL US [phone]: °1. Poor pain control °2. Reactions / problems with new medications (rash/itching, nausea, etc)  °3. Fever over 101.5 F (38.5 C) °4. Inability to urinate °5. Nausea and/or vomiting °6. Worsening swelling or bruising °7. Continued bleeding from incision. °8. Increased pain, redness, or drainage from the incision ° ° The clinic staff is available to answer your questions during regular business hours (8:30am-5pm).  Please don’t hesitate to call and ask to speak to one of our nurses for clinical concerns.  ° If you have a medical emergency, go to the nearest emergency room or call 911. ° A surgeon from [facility] is always on call at the hospitals in [place] ° °[facility] °[address], [address], [place], [place]  [address] ? ° [address] [address], [place], [place]   [address] °MAIN: [phone] ? TOLL FREE: [phone] ? FAX: [phone] °[email or web address] ° °Hernia °A hernia occurs when an internal organ pushes out through a weak spot in the abdominal wall. Hernias most commonly occur in the groin and around the navel. Hernias often can be pushed back into place (reduced). Most hernias tend to get worse over time. Some abdominal hernias can get stuck in the opening (irreducible or incarcerated hernia) and cannot be reduced. An irreducible abdominal hernia which is tightly squeezed into the opening is at risk for impaired blood supply (strangulated hernia). A strangulated hernia  is a medical emergency. Because of the risk for an irreducible or strangulated hernia, surgery may be recommended to repair a hernia. °CAUSES  °· Heavy lifting. °· Prolonged coughing. °· Straining to have a bowel movement. °· A cut (incision) made during an abdominal surgery. °HOME CARE INSTRUCTIONS  °· Bed rest is not required. You may continue your normal activities. °· Avoid lifting more than 10 pounds (4.5 kg) or straining. °· Cough gently. If you are a smoker it is best to stop. Even the best hernia repair can break down with the continual strain of coughing. Even if you do not have your hernia repaired, a cough will continue to aggravate the problem. °· Do not wear anything tight over your hernia. Do not try to keep it in with an outside bandage or truss. These can damage abdominal contents if they are trapped within the hernia sac. °· Eat a normal diet. °· Avoid constipation. Straining over long periods of time will increase hernia size and encourage breakdown of repairs. If you cannot do this with diet alone, stool softeners may be used. °SEEK IMMEDIATE MEDICAL   CARE IF:  °· You have a fever. °· You develop increasing abdominal pain. °· You feel nauseous or vomit. °· Your hernia is stuck outside the abdomen, looks discolored, feels hard, or is tender. °· You have any changes in your bowel habits or in the hernia that are unusual for you. °· You have increased pain or swelling around the hernia. °· You cannot push the hernia back in place by applying gentle pressure while lying down. °MAKE SURE YOU:  °· Understand these instructions. °· Will watch your condition. °· Will get help right away if you are not doing well or get worse. °Document Released: 11/15/2005 Document Revised: 02/07/2012 Document Reviewed: 07/04/2008 °[employer]® Patient Information ©2015 [employer]. This information is not intended to replace advice given to you by your health care provider. Make sure you discuss any questions you have with  your health care provider. ° °Managing Pain ° °Pain after surgery or related to activity is often due to strain/injury to muscle, tendon, nerves and/or incisions.  This pain is usually short-term and will improve in a few months.  ° °Many people find it helpful to do the following things TOGETHER to help speed the process of healing and to get back to regular activity more quickly: ° °1. Avoid heavy physical activity °a.  no lifting greater than 20 pounds °b. Do not “push through” the pain.  Listen to your body and avoid positions and maneuvers than reproduce the pain °c. Walking is okay as tolerated, but go slowly and stop when getting sore.  °d. Remember: If it hurts to do it, then don’t do it! °2. Take Anti-inflammatory medication  °a. Take with food/snack around the clock for 1-2 weeks °i. This helps the muscle and nerve tissues become less irritable and calm down faster °b. Choose ONE of the following over-the-counter medications: °i. Naproxen 220mg tabs (ex. Aleve) 1-2 pills twice a day  °ii. Ibuprofen 200mg tabs (ex. Advil, Motrin) 3-4 pills with every meal and just before bedtime °iii. Acetaminophen 500mg tabs (Tylenol) 1-2 pills with every meal and just before bedtime °3. Use a Heating pad or Ice/Cold Pack °a. 4-6 times a day °b. May use warm bath/hottub  or showers °4. Try Gentle Massage and/or Stretching  °a. at the area of pain many times a day °b. stop if you feel pain - do not overdo it ° °Try these steps together to help you body heal faster and avoid making things get worse.  Doing just one of these things may not be enough.   ° °If you are not getting better after two weeks or are noticing you are getting worse, contact our office for further advice; we may need to re-evaluate you & see what other things we can do to help. ° °GETTING TO GOOD BOWEL HEALTH. °Irregular bowel habits such as constipation and diarrhea can lead to many problems over time.  Having one soft bowel movement a day is the most  important way to prevent further problems.  The anorectal canal is designed to handle stretching and feces to safely manage our ability to get rid of solid waste (feces, poop, stool) out of our body.  BUT, hard constipated stools can act like ripping concrete bricks and diarrhea can be a burning fire to this very sensitive area of our body, causing inflamed hemorrhoids, anal fissures, increasing risk is perirectal abscesses, abdominal pain/bloating, an making irritable bowel worse.     °The goal: ONE SOFT BOWEL MOVEMENT A DAY!    To have soft, regular bowel movements:  °  Drink at least 8 tall glasses of water a day.   °  Take plenty of fiber.  Fiber is the undigested part of plant food that passes into the colon, acting s “natures broom” to encourage bowel motility and movement.  Fiber can absorb and hold large amounts of water. This results in a larger, bulkier stool, which is soft and easier to pass. Work gradually over several weeks up to 6 servings a day of fiber (25g a day even more if needed) in the form of: °o Vegetables -- Root (potatoes, carrots, turnips), leafy green (lettuce, salad greens, celery, spinach), or cooked high residue (cabbage, broccoli, etc) °o Fruit -- Fresh (unpeeled skin & pulp), Dried (prunes, apricots, cherries, etc ),  or stewed ( applesauce)  °o Whole grain breads, pasta, etc (whole wheat)  °o Bran cereals  °  Bulking Agents -- This type of water-retaining fiber generally is easily obtained each day by one of the following:  °o Psyllium bran -- The psyllium plant is remarkable because its ground seeds can retain so much water. This product is available as Metamucil, Konsyl, Effersyllium, Per Diem Fiber, or the less expensive generic preparation in drug and health food stores. Although labeled a laxative, it really is not a laxative.  °o Methylcellulose -- This is another fiber derived from wood which also retains water. It is available as Citrucel. °o Polyethylene Glycol - and  “artificial” fiber commonly called Miralax or Glycolax.  It is helpful for people with gassy or bloated feelings with regular fiber °o Flax Seed - a less gassy fiber than psyllium °  No reading or other relaxing activity while on the toilet. If bowel movements take longer than 5 minutes, you are too constipated °  AVOID CONSTIPATION.  High fiber and water intake usually takes care of this.  Sometimes a laxative is needed to stimulate more frequent bowel movements, but  °  Laxatives are not a good long-term solution as it can wear the colon out. °o Osmotics (Milk of Magnesia, Fleets phosphosoda, Magnesium citrate, MiraLax, GoLytely) are safer than  °o Stimulants (Senokot, Castor Oil, Dulcolax, Ex Lax)    °o Do not take laxatives for more than 7days in a row. °   IF SEVERELY CONSTIPATED, try a Bowel Retraining Program: °o Do not use laxatives.  °o Eat a diet high in roughage, such as bran cereals and leafy vegetables.  °o Drink six (6) ounces of prune or apricot juice each morning.  °o Eat two (2) large servings of stewed fruit each day.  °o Take one (1) heaping tablespoon of a psyllium-based bulking agent twice a day. Use sugar-free sweetener when possible to avoid excessive calories.  °o Eat a normal breakfast.  °o Set aside 15 minutes after breakfast to sit on the toilet, but do not strain to have a bowel movement.  °o If you do not have a bowel movement by the third day, use an enema and repeat the above steps.  °  Controlling diarrhea °o Switch to liquids and simpler foods for a few days to avoid stressing your intestines further. °o Avoid dairy products (especially milk & ice cream) for a short time.  The intestines often can lose the ability to digest lactose when stressed. °o Avoid foods that cause gassiness or bloating.  Typical foods include beans and other legumes, cabbage, broccoli, and dairy foods.  Every person has some sensitivity to other foods, so listen   to our body and avoid those foods that trigger  problems for you. °o Adding fiber (Citrucel, Metamucil, psyllium, Miralax) gradually can help thicken stools by absorbing excess fluid and retrain the intestines to act more normally.  Slowly increase the dose over a few weeks.  Too much fiber too soon can backfire and cause cramping & bloating. °o Probiotics (such as active yogurt, Align, etc) may help repopulate the intestines and colon with normal bacteria and calm down a sensitive digestive tract.  Most studies show it to be of mild help, though, and such products can be costly. °o Medicines: °  Bismuth subsalicylate (ex. Kayopectate, Pepto Bismol) every 30 minutes for up to 6 doses can help control diarrhea.  Avoid if pregnant. °  Loperamide (Immodium) can slow down diarrhea.  Start with two tablets (4mg total) first and then try one tablet every 6 hours.  Avoid if you are having fevers or severe pain.  If you are not better or start feeling worse, stop all medicines and call your doctor for advice °o Call your doctor if you are getting worse or not better.  Sometimes further testing (cultures, endoscopy, X-ray studies, bloodwork, etc) may be needed to help diagnose and treat the cause of the diarrhea. °o  ° °

## 2014-07-15 NOTE — Anesthesia Preprocedure Evaluation (Addendum)
Anesthesia Evaluation  Patient identified by MRN, date of birth, ID band Patient awake    Reviewed: Allergy & Precautions, H&P , NPO status , Patient's Chart, lab work & pertinent test results  Airway Mallampati: I TM Distance: >3 FB Neck ROM: Full    Dental  (+) Teeth Intact, Dental Advisory Given, Caps   Pulmonary shortness of breath, former smoker,  breath sounds clear to auscultation        Cardiovascular hypertension, Pt. on medications Rhythm:Regular Rate:Normal     Neuro/Psych    GI/Hepatic Neg liver ROS, GERD-  Medicated and Controlled,  Endo/Other  Hypothyroidism   Renal/GU negative Renal ROS     Musculoskeletal  (+) Arthritis -, on steriods ,  narcotic dependent  Abdominal   Peds  Hematology   Anesthesia Other Findings   Reproductive/Obstetrics negative OB ROS                         Anesthesia Physical Anesthesia Plan  ASA: III  Anesthesia Plan: General   Post-op Pain Management:    Induction: Intravenous  Airway Management Planned: Oral ETT  Additional Equipment:   Intra-op Plan:   Post-operative Plan: Extubation in OR  Informed Consent: I have reviewed the patients History and Physical, chart, labs and discussed the procedure including the risks, benefits and alternatives for the proposed anesthesia with the patient or authorized representative who has indicated his/her understanding and acceptance.   Dental advisory given  Plan Discussed with: CRNA, Anesthesiologist and Surgeon  Anesthesia Plan Comments:         Anesthesia Quick Evaluation

## 2014-07-17 ENCOUNTER — Telehealth (INDEPENDENT_AMBULATORY_CARE_PROVIDER_SITE_OTHER): Payer: Self-pay

## 2014-07-17 ENCOUNTER — Encounter (HOSPITAL_COMMUNITY): Payer: Self-pay | Admitting: Surgery

## 2014-07-17 ENCOUNTER — Other Ambulatory Visit (INDEPENDENT_AMBULATORY_CARE_PROVIDER_SITE_OTHER): Payer: Self-pay | Admitting: Surgery

## 2014-07-17 DIAGNOSIS — K432 Incisional hernia without obstruction or gangrene: Secondary | ICD-10-CM

## 2014-07-17 MED ORDER — HYDROMORPHONE HCL 4 MG PO TABS: 2.0000 mg | ORAL_TABLET | ORAL | Status: DC | PRN

## 2014-07-17 MED ORDER — METHOCARBAMOL 750 MG PO TABS: 750.0000 mg | ORAL_TABLET | Freq: Four times a day (QID) | ORAL | Status: DC | PRN

## 2014-07-17 NOTE — Progress Notes (Signed)
The patient tolerated saying hydrocodone on status post repair of incisional hernia.  History of chronic pain and other health issues.  Switch to Dilaudid of 4 mg 1-2 by mouth every 4 hours when necessary for pain.  #40 zero refills.  She has problems with oxycodone and numerous other medications.  Also Robaxin [phone] mg by mouth every 6 hour PRN muscle spasms # 30 with 2 refills.

## 2014-07-17 NOTE — Telephone Encounter (Signed)
Called pt back b/c since she called in after Tonya called her with the Dilaudid rx. Pt can not take Dilaudid b/c it surpressed her breathing so bad last time she took the medication it sent her to the ER. Per Dr Michaell CowingGross there is really no other pain medicine to offer pt with her long list of allergies. Pt advised to start the Robaxin along with taking 2 tabs of the Norco 10/325mg  every 4hrs. Pt advised to wear the binder, use ice/heat, and take the medication. I advised pt that she is going to have to tuff out the pain some with this just b/c she can't tolerate so many pain medications so it makes it very difficult to try to help with medication. Pt understands and will follow Dr Michaell CowingGross advise.

## 2014-07-17 NOTE — Telephone Encounter (Signed)
See other note.  Switched to Dilaudid & Robaxin.

## 2014-07-17 NOTE — Telephone Encounter (Signed)
Pt s/p umbilical incisional abdominal wall hernia on 8/17 by Dr Michaell CowingGross. Pt states that she has been taking the Norco 10/325 and this has not been helping with her pain. Pt rates her pain a 10/10 at this time. Pt states that she is unable to take any Ibuprofen due to her stomach. Pt states that she has been placing heat/ice on the area with no relief as well. Pt denies any fevers, chills, or n/v. Pt has been able to get some foods she states that she has not tried to eat very much at this time. Pt would like to know if Dr Michaell CowingGross could give her something different for pain. Advised pt that I would send Dr Michaell CowingGross a message and we will contact her as soon as we receive a response. Pt verbalized understanding and agrees with POC.

## 2014-07-18 NOTE — Telephone Encounter (Signed)
Pt is calling today to let Dr Michaell CowingGross know that she is having some nausea and dizziness today. Pt states that she has cut back on her Norco to see if this will help. Pt states that she has not had any vomiting at all. Advised pt that I can call her in a rx for Zofran 4mg  ODT 1 tab every 6 hours prn #15 per protocol. Pt states that she has been eating but has not had a great appetite. Pt denies any fevers or chills. Advised pt that if this continues or becomes worse to give our office a call back. Pt verbalized understanding and agrees with POC.

## 2014-07-31 ENCOUNTER — Encounter (INDEPENDENT_AMBULATORY_CARE_PROVIDER_SITE_OTHER): Payer: Medicare Other | Admitting: Surgery

## 2014-08-29 ENCOUNTER — Other Ambulatory Visit (HOSPITAL_BASED_OUTPATIENT_CLINIC_OR_DEPARTMENT_OTHER): Payer: Medicare Other

## 2014-08-29 DIAGNOSIS — D509 Iron deficiency anemia, unspecified: Secondary | ICD-10-CM

## 2014-08-29 LAB — CBC WITH DIFFERENTIAL/PLATELET
BASO%: 1.6 % (ref 0.0–2.0)
Basophils Absolute: 0.1 10*3/uL (ref 0.0–0.1)
EOS%: 26.6 % — AB (ref 0.0–7.0)
Eosinophils Absolute: 1.8 10*3/uL — ABNORMAL HIGH (ref 0.0–0.5)
HEMATOCRIT: 36.9 % (ref 34.8–46.6)
HEMOGLOBIN: 12.2 g/dL (ref 11.6–15.9)
LYMPH%: 27.3 % (ref 14.0–49.7)
MCH: 28.5 pg (ref 25.1–34.0)
MCHC: 33.1 g/dL (ref 31.5–36.0)
MCV: 86.2 fL (ref 79.5–101.0)
MONO#: 0.7 10*3/uL (ref 0.1–0.9)
MONO%: 10.5 % (ref 0.0–14.0)
NEUT#: 2.3 10*3/uL (ref 1.5–6.5)
NEUT%: 34 % — AB (ref 38.4–76.8)
Platelets: 280 10*3/uL (ref 145–400)
RBC: 4.28 10*6/uL (ref 3.70–5.45)
RDW: 14 % (ref 11.2–14.5)
WBC: 6.8 10*3/uL (ref 3.9–10.3)
lymph#: 1.9 10*3/uL (ref 0.9–3.3)

## 2014-08-29 LAB — IRON AND TIBC CHCC
%SAT: 22 % (ref 21–57)
IRON: 58 ug/dL (ref 41–142)
TIBC: 269 ug/dL (ref 236–444)
UIBC: 211 ug/dL (ref 120–384)

## 2014-08-29 LAB — FERRITIN CHCC: FERRITIN: 36 ng/mL (ref 9–269)

## 2014-09-04 ENCOUNTER — Encounter: Payer: Self-pay | Admitting: Internal Medicine

## 2014-09-04 ENCOUNTER — Telehealth: Payer: Self-pay | Admitting: Internal Medicine

## 2014-09-04 ENCOUNTER — Ambulatory Visit (HOSPITAL_BASED_OUTPATIENT_CLINIC_OR_DEPARTMENT_OTHER): Payer: Medicare Other | Admitting: Internal Medicine

## 2014-09-04 VITALS — BP 127/42 | HR 73 | Temp 98.3°F | Resp 18 | Ht 62.5 in | Wt 143.5 lb

## 2014-09-04 DIAGNOSIS — D509 Iron deficiency anemia, unspecified: Secondary | ICD-10-CM

## 2014-09-04 NOTE — Progress Notes (Signed)
[facility] [phone]) [phone]   [phone]  OFFICE PROGRESS NOTE  HAWKS,AL N, MD No address on file  DIAGNOSIS: Iron deficiency anemia with intolerable oral iron intake   PRIOR THERAPY: Status post Feraheme infusion x2 doses. Last dose was given 09/26/2012   CURRENT THERAPY: None.  INTERVAL HISTORY: Isabel Barnes 73 y.o. female returns to the clinic today for three-month followup visit. She recently underwent a hernia repair under the care of Dr. Michaell CowingGross. She is recovering slowly from her recent surgery. She denied having any significant fatigue or weakness. She denied having any dizzy spells. The patient has no bleeding, bruises or ecchymosis. She denied having any significant chest pain, shortness of breath, cough or hemoptysis. She had repeat CBC and iron study performed recently and she is here for evaluation and discussion of her lab results.   MEDICAL HISTORY: Past Medical History  Diagnosis Date  . Hypothyroidism   . Chronic pain disorder   . Osteoarthritis     lumbar,cervical,joints  . Spondylitis   . Complication of anesthesia     severe claustrophobia  . Spinal headache 1991    blood patch placed  . Hypertension   . History of recurrent UTI (urinary tract infection)   . GERD (gastroesophageal reflux disease)   . Umbilical hernia     to see surgeon next week  . Shortness of breath     hx  not used  inhaler for 2-3 months  . Pneumonia     hx  . History of kidney stones   . Anemia     iron deficiency hx    ALLERGIES:  is allergic to dilaudid; gabapentin; lyrica; singulair; banana; ciprofloxacin hcl; codeine; methadone; metronidazole; oysters; sulfa antibiotics; betadine; latex; nucynta; other; penicillins; and sulfur.  MEDICATIONS:  Current Outpatient Prescriptions  Medication Sig Dispense Refill  . albuterol (PROAIR HFA) 108 (90 BASE) MCG/ACT inhaler Inhale 2 puffs into the lungs every 4 (four) hours as needed for shortness  of breath.       Marland Kitchen. aspirin EC 81 MG tablet Take 81 mg by mouth every morning.      . Biotin 10 MG CAPS Take 10 mg by mouth daily.       . calcium-vitamin D (OSCAL WITH D) 500-200 MG-UNIT per tablet Take 1 tablet by mouth every morning.       . carboxymethylcellulose (EQ RESTORE TEARS) 0.5 % SOLN Place 1 drop into both eyes daily.      . cholecalciferol (VITAMIN D) 1000 UNITS tablet Take 1,000 Units by mouth daily.       . diclofenac sodium (VOLTAREN) 1 % GEL Apply 2 g topically 4 (four) times daily as needed (For pain.).       Marland Kitchen. Digestive Enzymes (ENZYME DIGEST PO) Take 1 tablet by mouth daily.       . diphenhydrAMINE (BENADRYL) 25 MG tablet Take 50 mg by mouth every 6 (six) hours as needed for allergies.      Marland Kitchen. estradiol (ESTRACE) 0.5 MG tablet Take 0.5 mg by mouth every other day.       . fish oil-omega-3 fatty acids 1000 MG capsule Take 1,000 mg by mouth 2 (two) times daily.       Marland Kitchen. HYDROcodone-acetaminophen (NORCO) 10-325 MG per tablet       . HYDROmorphone (DILAUDID) 4 MG tablet Take 0.5-2 tablets (2-8 mg total) by mouth every 4 (four) hours as needed for moderate pain or severe pain.  50 tablet  0  . levothyroxine (SYNTHROID, LEVOTHROID) 50 MCG tablet Take 50 mcg by mouth every morning.       Marland Kitchen losartan (COZAAR) 25 MG tablet Take 25 mg by mouth daily.       . magnesium 30 MG tablet Take 30 mg by mouth daily.       . methocarbamol (ROBAXIN) 750 MG tablet Take 1 tablet (750 mg total) by mouth 4 (four) times daily as needed (use for muscle cramps/pain).  30 tablet  2  . Misc Natural Products (TART CHERRY ADVANCED PO) Take 1 capsule by mouth 2 (two) times daily.       . Multiple Vitamins-Minerals (MULTIVITAMIN PO) Take 1 tablet by mouth daily.      Marland Kitchen omeprazole (PRILOSEC) 20 MG capsule Take 20 mg by mouth 2 (two) times daily before a meal.      . Probiotic Product (PROBIOTIC DAILY PO) Take 1 capsule by mouth daily.       . prochlorperazine (COMPAZINE) 10 MG tablet Take 10 mg by mouth every 8  (eight) hours as needed (For nausea.).       Marland Kitchen Red Yeast Rice 600 MG CAPS Take 1,200 mg by mouth daily after supper.       . Turmeric 500 MG CAPS Take 500 mg by mouth daily.       . vitamin B-12 (CYANOCOBALAMIN) 250 MCG tablet Take 250 mcg by mouth daily after supper.        No current facility-administered medications for this visit.    SURGICAL HISTORY:  Past Surgical History  Procedure Laterality Date  . Abdominal hysterectomy    . Laparoscopic cholecystectomy w/ cholangiography  2012    Dr Magnus Ivan  . Joint replacement Right 2012    shoulder  . Shoulder arthroscopy w/ rotator cuff repair Bilateral three times each over several yrs  . Cystoscopy w/ ureteroscopy  2012  . Thumb arthroscopy Left   . Nasal sinus surgery    . Anterior fusion cervical spine      x2 -C4-7  . Eye surgery Bilateral     cataract /lens implant  . Holmium laser application Left 02/08/2013    Procedure: HOLMIUM LASER APPLICATION;  Surgeon: Anner Crete, MD;  Location: [facility];  Service: Urology;  Laterality: Left;  . Radiology with anesthesia N/A 05/09/2014    Procedure: ADULT SEDATION WITH ANESTHESIA/MRI CERVICAL SPINE WITHOUT CONTRAST;  Surgeon: Medication Radiologist, MD;  Location: [facility] OR;  Service: Radiology;  Laterality: N/A;  DR. HAWKS/MRI  . Insertion of mesh N/A 07/15/2014    Procedure: INSERTION OF MESH;  Surgeon: Ardeth Sportsman, MD;  Location: [facility] OR;  Service: General;  Laterality: N/A;  . Umbilical hernia repair N/A 07/15/2014    Procedure: LAPAROSCOPIC UMBILICAL AND INFRAUMBILICAL HERNIA;  Surgeon: Ardeth Sportsman, MD;  Location: [facility] OR;  Service: General;  Laterality: N/A;    REVIEW OF SYSTEMS:  A comprehensive review of systems was negative except for: Musculoskeletal: positive for arthralgias and neck pain   PHYSICAL EXAMINATION: General appearance: alert, cooperative and no distress Head: Normocephalic, without obvious abnormality, atraumatic Neck: no adenopathy, no JVD,  supple, symmetrical, trachea midline and thyroid not enlarged, symmetric, no tenderness/mass/nodules Lymph nodes: Cervical, supraclavicular, and axillary nodes normal. Resp: clear to auscultation bilaterally Back: symmetric, no curvature. ROM normal. No CVA tenderness. Cardio: regular rate and rhythm, S1, S2 normal, no murmur, click, rub or gallop GI: soft, non-tender; bowel sounds normal; no masses,  no organomegaly  Extremities: extremities normal, atraumatic, no cyanosis or edema  ECOG PERFORMANCE STATUS: 0 - Asymptomatic  Blood pressure 127/42, pulse 73, temperature 98.3 F (36.8 C), temperature source Oral, resp. rate 18, height 5' 2.5" (1.588 m), weight 143 lb 8 oz (65.091 kg), SpO2 98.00%.  LABORATORY DATA: Lab Results  Component Value Date   WBC 6.8 08/29/2014   HGB 12.2 08/29/2014   HCT 36.9 08/29/2014   MCV 86.2 08/29/2014   PLT 280 08/29/2014      Chemistry      Component Value Date/Time   NA 143 07/08/2014 1140   K 3.9 07/08/2014 1140   CL 106 07/08/2014 1140   CO2 26 07/08/2014 1140   BUN 15 07/08/2014 1140   CREATININE 0.69 07/08/2014 1140      Component Value Date/Time   CALCIUM 9.2 07/08/2014 1140   ALKPHOS 60 06/19/2013 1215   AST 28 06/19/2013 1215   ALT 23 06/19/2013 1215   BILITOT 0.4 06/19/2013 1215       RADIOGRAPHIC STUDIES: No results found.  ASSESSMENT AND PLAN: This is a very pleasant 73 years old white female with history of iron deficiency anemia status post Feraheme infusion in October of 2013 and has been observation since that time. Her hemoglobin and hematocrit as well as the iron study and ferritin are normal today. I discussed the lab result with the patient and recommended for her to continue on observation with repeat CBC and iron study in 6 months. She was advised to call immediately she has any concerning symptoms in the interval. The patient voices understanding of current disease status and treatment options and is in agreement with the current  care plan.  All questions were answered. The patient knows to call the clinic with any problems, questions or concerns. We can certainly see the patient much sooner if necessary.  Disclaimer: This note was dictated with voice recognition software. Similar sounding words can inadvertently be transcribed and may not be corrected upon review.

## 2014-09-04 NOTE — Telephone Encounter (Signed)
gv pt appt schedule for april 2016.  °

## 2014-12-30 ENCOUNTER — Telehealth: Payer: Self-pay | Admitting: Internal Medicine

## 2014-12-30 NOTE — Telephone Encounter (Signed)
s.w. pt and r/s MD visit for earlier time on 4.13 due to MD on call....pt ok and aware

## 2015-02-03 ENCOUNTER — Encounter (HOSPITAL_COMMUNITY): Payer: Self-pay | Admitting: Emergency Medicine

## 2015-02-03 ENCOUNTER — Emergency Department (HOSPITAL_COMMUNITY)
Admission: EM | Admit: 2015-02-03 | Discharge: 2015-02-03 | Disposition: A | Discharge status: Home / Self Care | Payer: Medicare Other | Attending: Emergency Medicine | Admitting: Emergency Medicine

## 2015-02-03 ENCOUNTER — Emergency Department (HOSPITAL_COMMUNITY): Payer: Medicare Other

## 2015-02-03 DIAGNOSIS — Z87440 Personal history of urinary (tract) infections: Secondary | ICD-10-CM | POA: Diagnosis not present

## 2015-02-03 DIAGNOSIS — R011 Cardiac murmur, unspecified: Secondary | ICD-10-CM | POA: Insufficient documentation

## 2015-02-03 DIAGNOSIS — Z90710 Acquired absence of both cervix and uterus: Secondary | ICD-10-CM | POA: Insufficient documentation

## 2015-02-03 DIAGNOSIS — Z7982 Long term (current) use of aspirin: Secondary | ICD-10-CM | POA: Insufficient documentation

## 2015-02-03 DIAGNOSIS — K219 Gastro-esophageal reflux disease without esophagitis: Secondary | ICD-10-CM | POA: Insufficient documentation

## 2015-02-03 DIAGNOSIS — Z8701 Personal history of pneumonia (recurrent): Secondary | ICD-10-CM | POA: Insufficient documentation

## 2015-02-03 DIAGNOSIS — E039 Hypothyroidism, unspecified: Secondary | ICD-10-CM | POA: Diagnosis not present

## 2015-02-03 DIAGNOSIS — R109 Unspecified abdominal pain: Secondary | ICD-10-CM | POA: Diagnosis not present

## 2015-02-03 DIAGNOSIS — Z9889 Other specified postprocedural states: Secondary | ICD-10-CM | POA: Insufficient documentation

## 2015-02-03 DIAGNOSIS — Z880 Allergy status to penicillin: Secondary | ICD-10-CM | POA: Diagnosis not present

## 2015-02-03 DIAGNOSIS — Z9049 Acquired absence of other specified parts of digestive tract: Secondary | ICD-10-CM | POA: Diagnosis not present

## 2015-02-03 DIAGNOSIS — M1990 Unspecified osteoarthritis, unspecified site: Secondary | ICD-10-CM | POA: Insufficient documentation

## 2015-02-03 DIAGNOSIS — G894 Chronic pain syndrome: Secondary | ICD-10-CM | POA: Insufficient documentation

## 2015-02-03 DIAGNOSIS — Z87891 Personal history of nicotine dependence: Secondary | ICD-10-CM | POA: Diagnosis not present

## 2015-02-03 DIAGNOSIS — Z862 Personal history of diseases of the blood and blood-forming organs and certain disorders involving the immune mechanism: Secondary | ICD-10-CM | POA: Insufficient documentation

## 2015-02-03 DIAGNOSIS — Z87442 Personal history of urinary calculi: Secondary | ICD-10-CM | POA: Insufficient documentation

## 2015-02-03 DIAGNOSIS — I10 Essential (primary) hypertension: Secondary | ICD-10-CM | POA: Insufficient documentation

## 2015-02-03 DIAGNOSIS — Z79899 Other long term (current) drug therapy: Secondary | ICD-10-CM | POA: Insufficient documentation

## 2015-02-03 LAB — COMPREHENSIVE METABOLIC PANEL
ALBUMIN: 4.1 g/dL (ref 3.5–5.2)
ALT: 25 U/L (ref 0–35)
AST: 31 U/L (ref 0–37)
Alkaline Phosphatase: 77 U/L (ref 39–117)
Anion gap: 6 (ref 5–15)
BUN: 14 mg/dL (ref 6–23)
CALCIUM: 8.8 mg/dL (ref 8.4–10.5)
CO2: 25 mmol/L (ref 19–32)
CREATININE: 0.58 mg/dL (ref 0.50–1.10)
Chloride: 109 mmol/L (ref 96–112)
GFR calc Af Amer: >90 mL/min (ref >90)
GFR calc non Af Amer: 88 mL/min — ABNORMAL LOW (ref >90)
Glucose, Bld: 93 mg/dL (ref 70–99)
Potassium: 3.7 mmol/L (ref 3.5–5.1)
Sodium: 140 mmol/L (ref 135–145)
Total Bilirubin: 0.7 mg/dL (ref 0.3–1.2)
Total Protein: 6.8 g/dL (ref 6.0–8.3)

## 2015-02-03 LAB — URINALYSIS, ROUTINE W REFLEX MICROSCOPIC
Bilirubin Urine: NEGATIVE
GLUCOSE, UA: NEGATIVE mg/dL
Ketones, ur: NEGATIVE mg/dL
Leukocytes, UA: NEGATIVE
Nitrite: NEGATIVE
PH: 7 (ref 5.0–8.0)
Protein, ur: NEGATIVE mg/dL
Specific Gravity, Urine: 1.007 (ref 1.005–1.030)
Urobilinogen, UA: 0.2 mg/dL (ref 0.0–1.0)

## 2015-02-03 LAB — URINE MICROSCOPIC-ADD ON

## 2015-02-03 LAB — CBC WITH DIFFERENTIAL/PLATELET
Basophils Absolute: 0.1 10*3/uL (ref 0.0–0.1)
Basophils Relative: 1 % (ref 0–1)
EOS ABS: 1.3 10*3/uL — AB (ref 0.0–0.7)
EOS PCT: 20 % — AB (ref 0–5)
HEMATOCRIT: 36.8 % (ref 36.0–46.0)
HEMOGLOBIN: 12 g/dL (ref 12.0–15.0)
LYMPHS ABS: 1.5 10*3/uL (ref 0.7–4.0)
Lymphocytes Relative: 23 % (ref 12–46)
MCH: 28.6 pg (ref 26.0–34.0)
MCHC: 32.6 g/dL (ref 30.0–36.0)
MCV: 87.6 fL (ref 78.0–100.0)
MONO ABS: 0.6 10*3/uL (ref 0.1–1.0)
MONOS PCT: 9 % (ref 3–12)
Neutro Abs: 3 10*3/uL (ref 1.7–7.7)
Neutrophils Relative %: 47 % (ref 43–77)
Platelets: 322 10*3/uL (ref 150–400)
RBC: 4.2 MIL/uL (ref 3.87–5.11)
RDW: 13.8 % (ref 11.5–15.5)
WBC: 6.5 10*3/uL (ref 4.0–10.5)

## 2015-02-03 LAB — LIPASE, BLOOD: LIPASE: 21 U/L (ref 11–59)

## 2015-02-03 LAB — I-STAT TROPONIN, ED: Troponin i, poc: 0 ng/mL (ref 0.00–0.08)

## 2015-02-03 NOTE — Discharge Instructions (Signed)
Abdominal Pain, Women °Abdominal (stomach, pelvic, or belly) pain can be caused by many things. It is important to tell your doctor: °· The location of the pain. °· Does it come and go or is it present all the time? °· Are there things that start the pain (eating certain foods, exercise)? °· Are there other symptoms associated with the pain (fever, nausea, vomiting, diarrhea)? °All of this is helpful to know when trying to find the cause of the pain. °CAUSES  °· Stomach: virus or bacteria infection, or ulcer. °· Intestine: appendicitis (inflamed appendix), regional ileitis (Crohn's disease), ulcerative colitis (inflamed colon), irritable bowel syndrome, diverticulitis (inflamed diverticulum of the colon), or cancer of the stomach or intestine. °· Gallbladder disease or stones in the gallbladder. °· Kidney disease, kidney stones, or infection. °· Pancreas infection or cancer. °· Fibromyalgia (pain disorder). °· Diseases of the female organs: °¨ Uterus: fibroid (non-cancerous) tumors or infection. °¨ Fallopian tubes: infection or tubal pregnancy. °¨ Ovary: cysts or tumors. °¨ Pelvic adhesions (scar tissue). °¨ Endometriosis (uterus lining tissue growing in the pelvis and on the pelvic organs). °¨ Pelvic congestion syndrome (female organs filling up with blood just before the menstrual period). °¨ Pain with the menstrual period. °¨ Pain with ovulation (producing an egg). °¨ Pain with an IUD (intrauterine device, birth control) in the uterus. °¨ Cancer of the female organs. °· Functional pain (pain not caused by a disease, may improve without treatment). °· Psychological pain. °· Depression. °DIAGNOSIS  °Your doctor will decide the seriousness of your pain by doing an examination. °· Blood tests. °· X-rays. °· Ultrasound. °· CT scan (computed tomography, special type of X-ray). °· MRI (magnetic resonance imaging). °· Cultures, for infection. °· Barium enema (dye inserted in the large intestine, to better view it with  X-rays). °· Colonoscopy (looking in intestine with a lighted tube). °· Laparoscopy (minor surgery, looking in abdomen with a lighted tube). °· Major abdominal exploratory surgery (looking in abdomen with a large incision). °TREATMENT  °The treatment will depend on the cause of the pain.  °· Many cases can be observed and treated at home. °· Over-the-counter medicines recommended by your caregiver. °· Prescription medicine. °· Antibiotics, for infection. °· Birth control pills, for painful periods or for ovulation pain. °· Hormone treatment, for endometriosis. °· Nerve blocking injections. °· Physical therapy. °· Antidepressants. °· Counseling with a psychologist or psychiatrist. °· Minor or major surgery. °HOME CARE INSTRUCTIONS  °· Do not take laxatives, unless directed by your caregiver. °· Take over-the-counter pain medicine only if ordered by your caregiver. Do not take aspirin because it can cause an upset stomach or bleeding. °· Try a clear liquid diet (broth or water) as ordered by your caregiver. Slowly move to a bland diet, as tolerated, if the pain is related to the stomach or intestine. °· Have a thermometer and take your temperature several times a day, and record it. °· Bed rest and sleep, if it helps the pain. °· Avoid sexual intercourse, if it causes pain. °· Avoid stressful situations. °· Keep your follow-up appointments and tests, as your caregiver orders. °· If the pain does not go away with medicine or surgery, you may try: °¨ Acupuncture. °¨ Relaxation exercises (yoga, meditation). °¨ Group therapy. °¨ Counseling. °SEEK MEDICAL CARE IF:  °· You notice certain foods cause stomach pain. °· Your home care treatment is not helping your pain. °· You need stronger pain medicine. °· You want your IUD removed. °· You feel faint or   lightheaded. °· You develop nausea and vomiting. °· You develop a rash. °· You are having side effects or an allergy to your medicine. °SEEK IMMEDIATE MEDICAL CARE IF:  °· Your  pain does not go away or gets worse. °· You have a fever. °· Your pain is felt only in portions of the abdomen. The right side could possibly be appendicitis. The left lower portion of the abdomen could be colitis or diverticulitis. °· You are passing blood in your stools (bright red or black tarry stools, with or without vomiting). °· You have blood in your urine. °· You develop chills, with or without a fever. °· You pass out. °MAKE SURE YOU:  °· Understand these instructions. °· Will watch your condition. °· Will get help right away if you are not doing well or get worse. °Document Released: 09/12/2007 Document Revised: 04/01/2014 Document Reviewed: 10/02/2009 °[employer]® Patient Information ©2015 [employer]. This information is not intended to replace advice given to you by your health care provider. Make sure you discuss any questions you have with your health care provider. ° °

## 2015-02-03 NOTE — ED Provider Notes (Signed)
CSN: [id number]     Arrival date & time 02/03/15  1703 History   First MD Initiated Contact with Patient 02/03/15 1950     Chief Complaint  Patient presents with  . Abdominal Pain     (Consider location/radiation/quality/duration/timing/severity/associated sxs/prior Treatment) Patient is a 74 y.o. female presenting with abdominal pain. The history is provided by the patient. No language interpreter was used.  Abdominal Pain Pain location:  R flank Pain quality: shooting and throbbing   Pain radiates to:  Does not radiate Pain severity:  Moderate Onset quality:  Sudden Duration:  2 days Timing:  Intermittent Progression:  Waxing and waning Chronicity:  New Context: previous surgery   Ineffective treatments:  Not moving and bowel activity Associated symptoms: no cough, no diarrhea, no fever, no nausea and no vomiting   Risk factors: being elderly     Past Medical History  Diagnosis Date  . Hypothyroidism   . Chronic pain disorder   . Osteoarthritis     lumbar,cervical,joints  . Spondylitis   . Complication of anesthesia     severe claustrophobia  . Spinal headache 1991    blood patch placed  . Hypertension   . History of recurrent UTI (urinary tract infection)   . GERD (gastroesophageal reflux disease)   . Umbilical hernia     to see surgeon next week  . Shortness of breath     hx  not used  inhaler for 2-3 months  . Pneumonia     hx  . History of kidney stones   . Anemia     iron deficiency hx   Past Surgical History  Procedure Laterality Date  . Abdominal hysterectomy    . Laparoscopic cholecystectomy w/ cholangiography  2012    Dr Magnus Ivan  . Joint replacement Right 2012    shoulder  . Shoulder arthroscopy w/ rotator cuff repair Bilateral three times each over several yrs  . Cystoscopy w/ ureteroscopy  2012  . Thumb arthroscopy Left   . Nasal sinus surgery    . Anterior fusion cervical spine      x2 -C4-7  . Eye surgery Bilateral     cataract /lens  implant  . Holmium laser application Left 02/08/2013    Procedure: HOLMIUM LASER APPLICATION;  Surgeon: Anner Crete, MD;  Location: [facility];  Service: Urology;  Laterality: Left;  . Radiology with anesthesia N/A 05/09/2014    Procedure: ADULT SEDATION WITH ANESTHESIA/MRI CERVICAL SPINE WITHOUT CONTRAST;  Surgeon: Medication Radiologist, MD;  Location: [facility] OR;  Service: Radiology;  Laterality: N/A;  DR. HAWKS/MRI  . Insertion of mesh N/A 07/15/2014    Procedure: INSERTION OF MESH;  Surgeon: Ardeth Sportsman, MD;  Location: [facility] OR;  Service: General;  Laterality: N/A;  . Umbilical hernia repair N/A 07/15/2014    Procedure: LAPAROSCOPIC UMBILICAL AND INFRAUMBILICAL HERNIA;  Surgeon: Ardeth Sportsman, MD;  Location: [facility] OR;  Service: General;  Laterality: N/A;   Family History  Problem Relation Age of Onset  . Stroke Father    History  Substance Use Topics  . Smoking status: Former Smoker -- 0.50 packs/day for 2 years    Types: Cigarettes    Quit date: 02/07/1992  . Smokeless tobacco: Never Used  . Alcohol Use: No   OB History    No data available     Review of Systems  Constitutional: Negative for fever.  Respiratory: Negative for cough.   Gastrointestinal: Positive for abdominal pain. Negative for nausea, vomiting  and diarrhea.  All other systems reviewed and are negative.     Allergies  Dilaudid; Gabapentin; Lyrica; Singulair; Banana; Ciprofloxacin hcl; Codeine; Methadone; Metronidazole; Oysters; Sulfa antibiotics; Betadine; Latex; Nucynta; Other; Penicillins; and Sulfur  Home Medications   Prior to Admission medications   Medication Sig Start Date End Date Taking? Authorizing Provider  albuterol (PROAIR HFA) 108 (90 BASE) MCG/ACT inhaler Inhale 2 puffs into the lungs every 4 (four) hours as needed for shortness of breath.    Yes Historical Provider, MD  aspirin EC 81 MG tablet Take 81 mg by mouth every morning.   Yes Historical Provider, MD  Biotin 10 MG CAPS  Take 10 mg by mouth daily.    Yes Historical Provider, MD  calcium-vitamin D (OSCAL WITH D) 500-200 MG-UNIT per tablet Take 1 tablet by mouth every morning.    Yes Historical Provider, MD  carboxymethylcellulose (EQ RESTORE TEARS) 0.5 % SOLN Place 1 drop into both eyes daily.   Yes Historical Provider, MD  cholecalciferol (VITAMIN D) 1000 UNITS tablet Take 1,000 Units by mouth daily.    Yes Historical Provider, MD  diclofenac sodium (VOLTAREN) 1 % GEL Apply 2 g topically 4 (four) times daily as needed (For pain.).    Yes Historical Provider, MD  Digestive Enzymes (ENZYME DIGEST PO) Take 1 tablet by mouth daily.    Yes Historical Provider, MD  diphenhydrAMINE (BENADRYL) 25 MG tablet Take 50 mg by mouth every 6 (six) hours as needed for allergies.   Yes Historical Provider, MD  estradiol (ESTRACE) 0.5 MG tablet Take 0.5 mg by mouth every other day.    Yes Historical Provider, MD  fish oil-omega-3 fatty acids 1000 MG capsule Take 1,000 mg by mouth 2 (two) times daily.    Yes Historical Provider, MD  furosemide (LASIX) 20 MG tablet Take 10-20 mg by mouth daily.   Yes Historical Provider, MD  HYDROcodone-acetaminophen (NORCO) 10-325 MG per tablet Take 1 tablet by mouth every 4 (four) hours as needed for severe pain.  08/13/14  Yes Historical Provider, MD  levothyroxine (SYNTHROID, LEVOTHROID) 50 MCG tablet Take 50 mcg by mouth every morning.    Yes Historical Provider, MD  losartan (COZAAR) 25 MG tablet Take 25 mg by mouth daily.    Yes Historical Provider, MD  magnesium 30 MG tablet Take 30 mg by mouth daily.    Yes Historical Provider, MD  methocarbamol (ROBAXIN) 750 MG tablet Take 1 tablet (750 mg total) by mouth 4 (four) times daily as needed (use for muscle cramps/pain). 07/17/14  Yes Karie SodaSteven Gross, MD  Multiple Vitamins-Minerals (MULTIVITAMIN PO) Take 1 tablet by mouth daily.   Yes Historical Provider, MD  omeprazole (PRILOSEC) 20 MG capsule Take 20 mg by mouth daily.    Yes Historical Provider, MD   POTASSIUM PO Take 1 tablet by mouth daily.   Yes Historical Provider, MD  Probiotic Product (PROBIOTIC DAILY PO) Take 1 capsule by mouth daily.    Yes Historical Provider, MD  prochlorperazine (COMPAZINE) 10 MG tablet Take 5 mg by mouth every 8 (eight) hours as needed (For nausea.).    Yes Historical Provider, MD  Red Yeast Rice 600 MG CAPS Take 1,200 mg by mouth daily after supper.    Yes Historical Provider, MD  Turmeric 500 MG CAPS Take 500 mg by mouth at bedtime.    Yes Historical Provider, MD  vitamin B-12 (CYANOCOBALAMIN) 250 MCG tablet Take 250 mcg by mouth daily.    Yes Historical Provider, MD  HYDROmorphone (  DILAUDID) 4 MG tablet Take 0.5-2 tablets (2-8 mg total) by mouth every 4 (four) hours as needed for moderate pain or severe pain. 07/17/14   Karie Soda, MD   BP 174/73 mmHg  Pulse 81  Temp(Src) 97.8 F (36.6 C) (Oral)  Resp 18  SpO2 98% Physical Exam  Constitutional: She is oriented to person, place, and time. She appears well-developed and well-nourished.  HENT:  Head: Normocephalic.  Eyes: Conjunctivae are normal. Pupils are equal, round, and reactive to light.  Neck: Neck supple.  Cardiovascular: Normal rate and regular rhythm.   Murmur heard. Pulmonary/Chest: Effort normal and breath sounds normal.  Abdominal: Soft. There is tenderness.  Musculoskeletal: She exhibits no edema or tenderness.  Lymphadenopathy:    She has no cervical adenopathy.  Neurological: She is alert and oriented to person, place, and time.  Skin: Skin is warm and dry.  Psychiatric: She has a normal mood and affect.  Nursing note and vitals reviewed.   ED Course  Procedures (including critical care time) Labs Review Labs Reviewed  CBC WITH DIFFERENTIAL/PLATELET - Abnormal; Notable for the following:    Eosinophils Relative 20 (*)    Eosinophils Absolute 1.3 (*)    All other components within normal limits  COMPREHENSIVE METABOLIC PANEL - Abnormal; Notable for the following:    GFR calc  non Af Amer 88 (*)    All other components within normal limits  URINALYSIS, ROUTINE W REFLEX MICROSCOPIC - Abnormal; Notable for the following:    Hgb urine dipstick TRACE (*)    All other components within normal limits  URINE MICROSCOPIC-ADD ON - Abnormal; Notable for the following:    Squamous Epithelial / LPF FEW (*)    Bacteria, UA FEW (*)    All other components within normal limits  LIPASE, BLOOD  I-STAT TROPOININ, ED    Imaging Review No results found.   EKG Interpretation None     Patient with several days of RLQ/R flank pain.  No fever, N/V or D.  No peritoneal signs.  Lab and radiology results reviewed and shared with patient. No indication of UTI, renal stones, diverticulitis, appendicitis, or other acute findings.    Patient discussed with Dr. Donnald Garre.  Discharged home with follow-up with PCP. MDM   Final diagnoses:  Abdominal pain    Abdominal pain.    Felicie Morn, NP 02/03/15 2354  Arby Barrette, MD 02/07/15 1440

## 2015-02-03 NOTE — ED Notes (Signed)
Delay in labs pt registrating

## 2015-02-03 NOTE — ED Notes (Signed)
Pt states she called her MD who performed her umbilical hernia surgery, pt c/o intermittent right sided abdominal pain, painful to touch. Pt states her MD instructed her to come to ED.

## 2015-03-05 ENCOUNTER — Other Ambulatory Visit (HOSPITAL_BASED_OUTPATIENT_CLINIC_OR_DEPARTMENT_OTHER): Payer: Medicare Other

## 2015-03-05 DIAGNOSIS — D509 Iron deficiency anemia, unspecified: Secondary | ICD-10-CM | POA: Diagnosis not present

## 2015-03-05 LAB — CBC WITH DIFFERENTIAL/PLATELET
BASO%: 1.7 % (ref 0.0–2.0)
Basophils Absolute: 0.1 10*3/uL (ref 0.0–0.1)
EOS%: 15 % — ABNORMAL HIGH (ref 0.0–7.0)
Eosinophils Absolute: 0.7 10*3/uL — ABNORMAL HIGH (ref 0.0–0.5)
HEMATOCRIT: 33.7 % — AB (ref 34.8–46.6)
HEMOGLOBIN: 11.1 g/dL — AB (ref 11.6–15.9)
LYMPH%: 32.9 % (ref 14.0–49.7)
MCH: 28.4 pg (ref 25.1–34.0)
MCHC: 32.9 g/dL (ref 31.5–36.0)
MCV: 86.2 fL (ref 79.5–101.0)
MONO#: 0.6 10*3/uL (ref 0.1–0.9)
MONO%: 11.8 % (ref 0.0–14.0)
NEUT#: 1.8 10*3/uL (ref 1.5–6.5)
NEUT%: 38.6 % (ref 38.4–76.8)
Platelets: 233 10*3/uL (ref 145–400)
RBC: 3.91 10*6/uL (ref 3.70–5.45)
RDW: 14.3 % (ref 11.2–14.5)
WBC: 4.7 10*3/uL (ref 3.9–10.3)
lymph#: 1.6 10*3/uL (ref 0.9–3.3)

## 2015-03-05 LAB — IRON AND TIBC CHCC
%SAT: 39 % (ref 21–57)
IRON: 128 ug/dL (ref 41–142)
TIBC: 328 ug/dL (ref 236–444)
UIBC: 200 ug/dL (ref 120–384)

## 2015-03-05 LAB — FERRITIN CHCC: Ferritin: 20 ng/ml (ref 9–269)

## 2015-03-12 ENCOUNTER — Telehealth: Payer: Self-pay | Admitting: Internal Medicine

## 2015-03-12 ENCOUNTER — Ambulatory Visit: Payer: Medicare Other | Admitting: Internal Medicine

## 2015-03-12 ENCOUNTER — Ambulatory Visit (HOSPITAL_BASED_OUTPATIENT_CLINIC_OR_DEPARTMENT_OTHER): Payer: Medicare Other | Admitting: Internal Medicine

## 2015-03-12 ENCOUNTER — Encounter: Payer: Self-pay | Admitting: Internal Medicine

## 2015-03-12 VITALS — BP 139/50 | HR 78 | Temp 98.0°F | Resp 18 | Ht 62.5 in | Wt 148.5 lb

## 2015-03-12 DIAGNOSIS — D509 Iron deficiency anemia, unspecified: Secondary | ICD-10-CM

## 2015-03-12 NOTE — Telephone Encounter (Signed)
gave and printed appt sched and avs fo rpt for April and JULY

## 2015-03-12 NOTE — Progress Notes (Signed)
[facility] Telephone:([phone]   [phone]) [phone]  OFFICE PROGRESS NOTE  Isabel Bay, MD [address] [facility] [place] [address]  DIAGNOSIS: Iron deficiency anemia with intolerable oral iron intake   PRIOR THERAPY: Status post Feraheme infusion x 2 doses. Last dose was given 09/26/2012   CURRENT THERAPY: None.  INTERVAL HISTORY: Isabel Barnes 74 y.o. female returns to the clinic today for 18-months followup visit. She is feeling a little bit more tired and fatigued recently. She denied having any dizzy spells. The patient has no bleeding, bruises or ecchymosis. She denied having any significant chest pain, shortness of breath, cough or hemoptysis. She had repeat CBC, iron study and ferritin performed recently and she is here for evaluation and discussion of her lab results.   MEDICAL HISTORY: Past Medical History  Diagnosis Date  . Hypothyroidism   . Chronic pain disorder   . Osteoarthritis     lumbar,cervical,joints  . Spondylitis   . Complication of anesthesia     severe claustrophobia  . Spinal headache 1991    blood patch placed  . Hypertension   . History of recurrent UTI (urinary tract infection)   . GERD (gastroesophageal reflux disease)   . Umbilical hernia     to see surgeon next week  . Shortness of breath     hx  not used  inhaler for 2-3 months  . Pneumonia     hx  . History of kidney stones   . Anemia     iron deficiency hx    ALLERGIES:  is allergic to dilaudid; gabapentin; lyrica; singulair; banana; ciprofloxacin hcl; codeine; methadone; metronidazole; oysters; sulfa antibiotics; betadine; latex; nucynta; other; penicillins; and sulfur.  MEDICATIONS:  Current Outpatient Prescriptions  Medication Sig Dispense Refill  . albuterol (PROAIR HFA) 108 (90 BASE) MCG/ACT inhaler Inhale 2 puffs into the lungs every 4 (four) hours as needed for shortness of breath.     Marland Kitchen aspirin EC 81 MG tablet Take 81 mg  by mouth every morning.    . Biotin 10 MG CAPS Take 10 mg by mouth daily.     . calcium-vitamin D (OSCAL WITH D) 500-200 MG-UNIT per tablet Take 1 tablet by mouth every morning.     . carboxymethylcellulose (EQ RESTORE TEARS) 0.5 % SOLN Place 1 drop into both eyes daily.    . cholecalciferol (VITAMIN D) 1000 UNITS tablet Take 1,000 Units by mouth daily.     . diclofenac sodium (VOLTAREN) 1 % GEL Apply 2 g topically 4 (four) times daily as needed (For pain.).     Marland Kitchen Digestive Enzymes (ENZYME DIGEST PO) Take 1 tablet by mouth daily.     . diphenhydrAMINE (BENADRYL) 25 MG tablet Take 50 mg by mouth every 6 (six) hours as needed for allergies.    Marland Kitchen estradiol (ESTRACE) 0.5 MG tablet Take 0.5 mg by mouth every other day.     . fish oil-omega-3 fatty acids 1000 MG capsule Take 1,000 mg by mouth 2 (two) times daily.     . furosemide (LASIX) 20 MG tablet Take 10-20 mg by mouth daily.    Marland Kitchen HYDROcodone-acetaminophen (NORCO) 10-325 MG per tablet Take 1 tablet by mouth every 4 (four) hours as needed for severe pain.     Marland Kitchen levothyroxine (SYNTHROID, LEVOTHROID) 50 MCG tablet Take 50 mcg by mouth every morning.     Marland Kitchen losartan (COZAAR) 25 MG tablet Take 25 mg by mouth daily.     Marland Kitchen  magnesium 30 MG tablet Take 30 mg by mouth daily.     . methocarbamol (ROBAXIN) 750 MG tablet Take 1 tablet (750 mg total) by mouth 4 (four) times daily as needed (use for muscle cramps/pain). 30 tablet 2  . Multiple Vitamins-Minerals (MULTIVITAMIN PO) Take 1 tablet by mouth daily.    Marland Kitchen. omeprazole (PRILOSEC) 20 MG capsule Take 20 mg by mouth daily.     Marland Kitchen. POTASSIUM PO Take 1 tablet by mouth daily.    . Probiotic Product (PROBIOTIC DAILY PO) Take 1 capsule by mouth daily.     . Red Yeast Rice 600 MG CAPS Take 1,200 mg by mouth daily after supper.     . prochlorperazine (COMPAZINE) 10 MG tablet Take 5 mg by mouth every 8 (eight) hours as needed (For nausea.).     Marland Kitchen. Turmeric 500 MG CAPS Take 500 mg by mouth at bedtime.     . vitamin B-12  (CYANOCOBALAMIN) 250 MCG tablet Take 250 mcg by mouth daily.      No current facility-administered medications for this visit.    SURGICAL HISTORY:  Past Surgical History  Procedure Laterality Date  . Abdominal hysterectomy    . Laparoscopic cholecystectomy w/ cholangiography  2012    Dr Magnus IvanBlackman  . Joint replacement Right 2012    shoulder  . Shoulder arthroscopy w/ rotator cuff repair Bilateral three times each over several yrs  . Cystoscopy w/ ureteroscopy  2012  . Thumb arthroscopy Left   . Nasal sinus surgery    . Anterior fusion cervical spine      x2 -C4-7  . Eye surgery Bilateral     cataract /lens implant  . Holmium laser application Left 02/08/2013    Procedure: HOLMIUM LASER APPLICATION;  Surgeon: Anner CreteJohn J Wrenn, MD;  [facility];  Service: Urology;  Laterality: Left;  . Radiology with anesthesia N/A 05/09/2014    Procedure: ADULT SEDATION WITH ANESTHESIA/MRI CERVICAL SPINE WITHOUT CONTRAST;  Surgeon: Medication Radiologist, MD;  Location: [facility];  Service: Radiology;  Laterality: N/A;  DR. HAWKS/MRI  . Insertion of mesh N/A 07/15/2014    Procedure: INSERTION OF MESH;  Surgeon: Ardeth SportsmanSteven C. Gross, MD;  Location: [facility] OR;  Service: General;  Laterality: N/A;  . Umbilical hernia repair N/A 07/15/2014    Procedure: LAPAROSCOPIC UMBILICAL AND INFRAUMBILICAL HERNIA;  Surgeon: Ardeth SportsmanSteven C. Gross, MD;  Location: [facility];  Service: General;  Laterality: N/A;    REVIEW OF SYSTEMS:  A comprehensive review of systems was negative except for: Constitutional: positive for fatigue   PHYSICAL EXAMINATION: General appearance: alert, cooperative and no distress Head: Normocephalic, without obvious abnormality, atraumatic Neck: no adenopathy, no JVD, supple, symmetrical, trachea midline and thyroid not enlarged, symmetric, no tenderness/mass/nodules Lymph nodes: Cervical, supraclavicular, and axillary nodes normal. Resp: clear to auscultation bilaterally Back: symmetric, no  curvature. ROM normal. No CVA tenderness. Cardio: regular rate and rhythm, S1, S2 normal, no murmur, click, rub or gallop GI: soft, non-tender; bowel sounds normal; no masses,  no organomegaly Extremities: extremities normal, atraumatic, no cyanosis or edema  ECOG PERFORMANCE STATUS: 1 - Symptomatic but completely ambulatory  Blood pressure 139/50, pulse 78, temperature 98 F (36.7 C), temperature source Oral, resp. rate 18, height 5' 2.5" (1.588 m), weight 148 lb 8 oz (67.359 kg), SpO2 98 %.  LABORATORY DATA: Lab Results  Component Value Date   WBC 4.7 03/05/2015   HGB 11.1* 03/05/2015   HCT 33.7* 03/05/2015   MCV 86.2 03/05/2015   PLT  233 03/05/2015      Chemistry      Component Value Date/Time   NA 140 02/03/2015 1749   K 3.7 02/03/2015 1749   CL 109 02/03/2015 1749   CO2 25 02/03/2015 1749   BUN 14 02/03/2015 1749   CREATININE 0.58 02/03/2015 1749      Component Value Date/Time   CALCIUM 8.8 02/03/2015 1749   ALKPHOS 77 02/03/2015 1749   AST 31 02/03/2015 1749   ALT 25 02/03/2015 1749   BILITOT 0.7 02/03/2015 1749      RADIOGRAPHIC STUDIES: No results found.  ASSESSMENT AND PLAN: This is a very pleasant 74 years old white female with history of iron deficiency anemia status post Feraheme infusion in October of 2013 and has been observation since that time. The patient cannot tolerate oral iron tablets Her hemoglobin and hematocrit are low today. I discussed the lab result with the patient and recommended for her to consider treatment with Feraheme again.  The patient is interested in proceeding with the treatment and she is expected to start the first cycle of this treatment on 03/14/2015. She will come back for follow-up visit in 3 months for reevaluation after repeating CBC, iron study and ferritin. She was advised to call immediately she has any concerning symptoms in the interval. The patient voices understanding of current disease status and treatment options  and is in agreement with the current care plan.  All questions were answered. The patient knows to call the clinic with any problems, questions or concerns. We can certainly see the patient much sooner if necessary.  Disclaimer: This note was dictated with voice recognition software. Similar sounding words can inadvertently be transcribed and may not be corrected upon review.

## 2015-03-14 ENCOUNTER — Ambulatory Visit (HOSPITAL_BASED_OUTPATIENT_CLINIC_OR_DEPARTMENT_OTHER): Payer: Medicare Other

## 2015-03-14 VITALS — BP 151/62 | HR 70 | Temp 97.7°F | Resp 19

## 2015-03-14 DIAGNOSIS — D509 Iron deficiency anemia, unspecified: Secondary | ICD-10-CM

## 2015-03-14 MED ORDER — SODIUM CHLORIDE 0.9 % IV SOLN
510.0000 mg | Freq: Once | INTRAVENOUS | Status: AC
  Administered 2015-03-14: 510 mg via INTRAVENOUS
  Filled 2015-03-14: qty 17

## 2015-03-14 MED ORDER — SODIUM CHLORIDE 0.9 % IV SOLN
Freq: Once | INTRAVENOUS | Status: AC
  Administered 2015-03-14: 12:00:00 via INTRAVENOUS

## 2015-03-14 NOTE — Patient Instructions (Signed)

## 2015-03-21 ENCOUNTER — Ambulatory Visit (HOSPITAL_BASED_OUTPATIENT_CLINIC_OR_DEPARTMENT_OTHER): Payer: Medicare Other

## 2015-03-21 VITALS — BP 156/59 | HR 64 | Temp 98.6°F | Resp 18

## 2015-03-21 DIAGNOSIS — D509 Iron deficiency anemia, unspecified: Secondary | ICD-10-CM | POA: Diagnosis not present

## 2015-03-21 MED ORDER — SODIUM CHLORIDE 0.9 % IV SOLN
Freq: Once | INTRAVENOUS | Status: AC
  Administered 2015-03-21: 12:00:00 via INTRAVENOUS

## 2015-03-21 MED ORDER — SODIUM CHLORIDE 0.9 % IV SOLN
510.0000 mg | Freq: Once | INTRAVENOUS | Status: AC
  Administered 2015-03-21: 510 mg via INTRAVENOUS
  Filled 2015-03-21: qty 17

## 2015-03-21 NOTE — Patient Instructions (Signed)

## 2015-05-06 ENCOUNTER — Telehealth: Payer: Self-pay | Admitting: Internal Medicine

## 2015-05-06 ENCOUNTER — Other Ambulatory Visit: Payer: Self-pay | Admitting: *Deleted

## 2015-05-06 MED ORDER — UNABLE TO FIND
1.0000 | Status: DC | PRN
Start: 2015-05-06 — End: 2015-05-06

## 2015-05-06 NOTE — Telephone Encounter (Signed)
Dr. Lendon ColonelHawks office lvm for pt to get sooner appt due to labs. i fwd msg to triage.

## 2015-05-06 NOTE — Telephone Encounter (Signed)
No need to see her sooner

## 2015-05-06 NOTE — Telephone Encounter (Signed)
TC from dr. Lendon ColonelHawks office requesting earlier appt for pt as her "eosinophils have increased" per vm message from [place] @ Dr. Lendon ColonelHawks office. Currently scheduled for July 11

## 2015-05-06 NOTE — Telephone Encounter (Signed)
Fax received from Dr. Lendon ColonelHawks office, called and spoke with Judeth CornfieldStephanie; pt to keep appt 7/18, no need to be seen sooner. MD has reviewed pt labs/progress note. Judeth CornfieldStephanie confirmed, message will be given to [place].

## 2015-05-06 NOTE — Addendum Note (Signed)
Addended by: Cooper RenderGARNER, MARY P on: 05/06/2015 10:21 AM   Modules accepted: Orders, Medications

## 2015-06-09 ENCOUNTER — Other Ambulatory Visit (HOSPITAL_BASED_OUTPATIENT_CLINIC_OR_DEPARTMENT_OTHER): Payer: Medicare Other

## 2015-06-09 DIAGNOSIS — D509 Iron deficiency anemia, unspecified: Secondary | ICD-10-CM

## 2015-06-09 LAB — IRON AND TIBC CHCC
%SAT: 46 % (ref 21–57)
Iron: 94 ug/dL (ref 41–142)
TIBC: 204 ug/dL — ABNORMAL LOW (ref 236–444)
UIBC: 110 ug/dL — ABNORMAL LOW (ref 120–384)

## 2015-06-09 LAB — CBC WITH DIFFERENTIAL/PLATELET
BASO%: 1.3 % (ref 0.0–2.0)
Basophils Absolute: 0.1 10*3/uL (ref 0.0–0.1)
EOS%: 22.4 % — ABNORMAL HIGH (ref 0.0–7.0)
Eosinophils Absolute: 1.3 10*3/uL — ABNORMAL HIGH (ref 0.0–0.5)
HCT: 35 % (ref 34.8–46.6)
HGB: 11.7 g/dL (ref 11.6–15.9)
LYMPH%: 31 % (ref 14.0–49.7)
MCH: 29.4 pg (ref 25.1–34.0)
MCHC: 33.3 g/dL (ref 31.5–36.0)
MCV: 88.2 fL (ref 79.5–101.0)
MONO#: 0.6 10*3/uL (ref 0.1–0.9)
MONO%: 10.6 % (ref 0.0–14.0)
NEUT#: 2 10*3/uL (ref 1.5–6.5)
NEUT%: 34.7 % — ABNORMAL LOW (ref 38.4–76.8)
Platelets: 250 10*3/uL (ref 145–400)
RBC: 3.97 10*6/uL (ref 3.70–5.45)
RDW: 14 % (ref 11.2–14.5)
WBC: 5.8 10*3/uL (ref 3.9–10.3)
lymph#: 1.8 10*3/uL (ref 0.9–3.3)

## 2015-06-09 LAB — FERRITIN CHCC: Ferritin: 187 ng/ml (ref 9–269)

## 2015-06-16 ENCOUNTER — Encounter: Payer: Self-pay | Admitting: Internal Medicine

## 2015-06-16 ENCOUNTER — Ambulatory Visit (HOSPITAL_BASED_OUTPATIENT_CLINIC_OR_DEPARTMENT_OTHER): Payer: Medicare Other | Admitting: Internal Medicine

## 2015-06-16 ENCOUNTER — Telehealth: Payer: Self-pay | Admitting: Internal Medicine

## 2015-06-16 VITALS — BP 146/62 | HR 71 | Temp 98.2°F | Resp 17 | Ht 62.5 in | Wt 145.4 lb

## 2015-06-16 DIAGNOSIS — D721 Eosinophilia, unspecified: Secondary | ICD-10-CM | POA: Insufficient documentation

## 2015-06-16 DIAGNOSIS — D509 Iron deficiency anemia, unspecified: Secondary | ICD-10-CM | POA: Diagnosis not present

## 2015-06-16 NOTE — Telephone Encounter (Signed)
Pt confirmed labs/ov per 07/18 POF, gave pt AVS and Calendar... KJ °

## 2015-06-16 NOTE — Progress Notes (Signed)
[name]:([phone]   Fax:([phone]  OFFICE PROGRESS NOTE  Isabel BayHAWKS,ALDENE N, MD [address] [facility] [id number]  DIAGNOSIS: Iron deficiency anemia with intolerable oral iron intake   PRIOR THERAPY: Status post Feraheme infusion x 2 doses. Last dose was given 03/21/2015  CURRENT THERAPY: None.  INTERVAL HISTORY: Isabel Barnes 74 y.o. female returns to the clinic today for 593-months followup visit. Her main complaint is arthritis of the knee and she recently received steroid injection to her knee. She denied having any fatigue or weakness. She denied having any dizzy spells. She tolerated the last Feraheme infusion fairly well. The patient has no bleeding, bruises or ecchymosis. She denied having any significant chest pain, shortness of breath, cough or hemoptysis. She had repeat CBC, iron study and ferritin performed recently and she is here for evaluation and discussion of her lab results.   MEDICAL HISTORY: Past Medical History  Diagnosis Date  . Hypothyroidism   . Chronic pain disorder   . Osteoarthritis     lumbar,cervical,joints  . Spondylitis   . Complication of anesthesia     severe claustrophobia  . Spinal headache 1991    blood patch placed  . Hypertension   . History of recurrent UTI (urinary tract infection)   . GERD (gastroesophageal reflux disease)   . Umbilical hernia     to see surgeon next week  . Shortness of breath     hx  not used  inhaler for 2-3 months  . Pneumonia     hx  . History of kidney stones   . Anemia     iron deficiency hx    ALLERGIES:  is allergic to dilaudid; gabapentin; lyrica; singulair; banana; ciprofloxacin hcl; codeine; methadone; metronidazole; oysters; sulfa antibiotics; betadine; latex; nucynta; other; penicillins; and sulfur.  MEDICATIONS:  Current Outpatient Prescriptions  Medication Sig Dispense Refill  . aspirin EC 81 MG tablet Take 81 mg by mouth  every morning.    . Biotin 10 MG CAPS Take 10 mg by mouth daily.     . calcium-vitamin D (OSCAL WITH D) 500-200 MG-UNIT per tablet Take 1 tablet by mouth every morning.     . carboxymethylcellulose (EQ RESTORE TEARS) 0.5 % SOLN Place 1 drop into both eyes daily.    . cholecalciferol (VITAMIN D) 1000 UNITS tablet Take 1,000 Units by mouth daily.     . diclofenac sodium (VOLTAREN) 1 % GEL Apply 2 g topically 4 (four) times daily as needed (For pain.).     Marland Kitchen. Digestive Enzymes (ENZYME DIGEST PO) Take 1 tablet by mouth daily.     . diphenhydrAMINE (BENADRYL) 25 MG tablet Take 50 mg by mouth every 6 (six) hours as needed for allergies.    Marland Kitchen. estradiol (ESTRACE) 0.5 MG tablet Take 0.5 mg by mouth every other day.     . fish oil-omega-3 fatty acids 1000 MG capsule Take 1,000 mg by mouth 2 (two) times daily.     . furosemide (LASIX) 20 MG tablet Take 10-20 mg by mouth daily.    Marland Kitchen. HYDROcodone-acetaminophen (NORCO) 10-325 MG per tablet Take 1 tablet by mouth every 4 (four) hours as needed for severe pain.     Marland Kitchen. levothyroxine (SYNTHROID, LEVOTHROID) 50 MCG tablet Take 50 mcg by mouth every morning.     Marland Kitchen. losartan (COZAAR) 25 MG tablet Take 25 mg by mouth daily.     . magnesium 30 MG tablet Take  30 mg by mouth daily.     . methocarbamol (ROBAXIN) 750 MG tablet Take 1 tablet (750 mg total) by mouth 4 (four) times daily as needed (use for muscle cramps/pain). 30 tablet 2  . Multiple Vitamins-Minerals (MULTIVITAMIN PO) Take 1 tablet by mouth daily.    Marland Kitchen omeprazole (PRILOSEC) 20 MG capsule Take 20 mg by mouth daily.     Marland Kitchen POTASSIUM PO Take 1 tablet by mouth daily.    . Probiotic Product (PROBIOTIC DAILY PO) Take 1 capsule by mouth daily.     . prochlorperazine (COMPAZINE) 10 MG tablet Take 5 mg by mouth every 8 (eight) hours as needed (For nausea.).     Marland Kitchen Red Yeast Rice 600 MG CAPS Take 1,200 mg by mouth daily after supper.     . Turmeric 500 MG CAPS Take 500 mg by mouth at bedtime.     . vitamin B-12  (CYANOCOBALAMIN) 250 MCG tablet Take 250 mcg by mouth daily.     Marland Kitchen albuterol (PROAIR HFA) 108 (90 BASE) MCG/ACT inhaler Inhale 2 puffs into the lungs every 4 (four) hours as needed for shortness of breath.      No current facility-administered medications for this visit.    SURGICAL HISTORY:  Past Surgical History  Procedure Laterality Date  . Abdominal hysterectomy    . Laparoscopic cholecystectomy w/ cholangiography  2012    Dr Magnus Ivan  . Joint replacement Right 2012    shoulder  . Shoulder arthroscopy w/ rotator cuff repair Bilateral three times each over several yrs  . Cystoscopy w/ ureteroscopy  2012  . Thumb arthroscopy Left   . Nasal sinus surgery    . Anterior fusion cervical spine      x2 -C4-7  . Eye surgery Bilateral     cataract /lens implant  . Holmium laser application Left 02/08/2013    Procedure: HOLMIUM LASER APPLICATION;  Surgeon: Anner Crete, MD;  Location: [facility];  Service: Urology;  Laterality: Left;  . Radiology with anesthesia N/A 05/09/2014    Procedure: ADULT SEDATION WITH ANESTHESIA/MRI CERVICAL SPINE WITHOUT CONTRAST;  Surgeon: Medication Radiologist, MD;  Location: [facility];  Service: Radiology;  Laterality: N/A;  DR. HAWKS/MRI  . Insertion of mesh N/A 07/15/2014    Procedure: INSERTION OF MESH;  Surgeon: Ardeth Sportsman, MD;  Location: [facility];  Service: General;  Laterality: N/A;  . Umbilical hernia repair N/A 07/15/2014    Procedure: LAPAROSCOPIC UMBILICAL AND INFRAUMBILICAL HERNIA;  Surgeon: Ardeth Sportsman, MD;  Location: [facility];  Service: General;  Laterality: N/A;    REVIEW OF SYSTEMS:  A comprehensive review of systems was negative except for: Musculoskeletal: positive for arthralgias   PHYSICAL EXAMINATION: General appearance: alert, cooperative and no distress Head: Normocephalic, without obvious abnormality, atraumatic Neck: no adenopathy, no JVD, supple, symmetrical, trachea midline and thyroid not enlarged, symmetric, no  tenderness/mass/nodules Lymph nodes: Cervical, supraclavicular, and axillary nodes normal. Resp: clear to auscultation bilaterally Back: symmetric, no curvature. ROM normal. No CVA tenderness. Cardio: regular rate and rhythm, S1, S2 normal, no murmur, click, rub or gallop GI: soft, non-tender; bowel sounds normal; no masses,  no organomegaly Extremities: extremities normal, atraumatic, no cyanosis or edema  ECOG PERFORMANCE STATUS: 1 - Symptomatic but completely ambulatory  Blood pressure 146/62, pulse 71, temperature 98.2 F (36.8 C), temperature source Oral, resp. rate 17, height 5' 2.5" (1.588 m), weight 145 lb 6.4 oz (65.953 kg), SpO2 99 %.  LABORATORY DATA: Lab Results  Component Value  Date   WBC 5.8 06/09/2015   HGB 11.7 06/09/2015   HCT 35.0 06/09/2015   MCV 88.2 06/09/2015   PLT 250 06/09/2015      Chemistry      Component Value Date/Time   NA 140 02/03/2015 1749   K 3.7 02/03/2015 1749   CL 109 02/03/2015 1749   CO2 25 02/03/2015 1749   BUN 14 02/03/2015 1749   CREATININE 0.58 02/03/2015 1749      Component Value Date/Time   CALCIUM 8.8 02/03/2015 1749   ALKPHOS 77 02/03/2015 1749   AST 31 02/03/2015 1749   ALT 25 02/03/2015 1749   BILITOT 0.7 02/03/2015 1749      RADIOGRAPHIC STUDIES: No results found.  ASSESSMENT AND PLAN: This is a very pleasant 74 years old white female with history of iron deficiency anemia status post Feraheme infusion in October of 2013 and has been observation since that time. The patient cannot tolerate oral iron tablets Her hemoglobin and hematocrit are normal today. I discussed the lab result with the patient today and recommended for her to continue on observation. Her eosinophilia is most likely reactive in nature secondary to inflammatory process as well as the recent steroid treatment. I will continue to monitor this closely. She will come back for follow-up visit in 3 months for reevaluation after repeating CBC, iron study and  ferritin. She was advised to call immediately she has any concerning symptoms in the interval. The patient voices understanding of current disease status and treatment options and is in agreement with the current care plan.  All questions were answered. The patient knows to call the clinic with any problems, questions or concerns. We can certainly see the patient much sooner if necessary.  Disclaimer: This note was dictated with voice recognition software. Similar sounding words can inadvertently be transcribed and may not be corrected upon review.

## 2015-08-28 ENCOUNTER — Other Ambulatory Visit (HOSPITAL_COMMUNITY): Payer: Self-pay | Admitting: Orthopedic Surgery

## 2015-08-28 ENCOUNTER — Ambulatory Visit (HOSPITAL_COMMUNITY)
Admission: RE | Admit: 2015-08-28 | Discharge: 2015-08-28 | Disposition: A | Discharge status: Home / Self Care | Payer: Medicare Other | Source: Ambulatory Visit | Attending: Family Medicine | Admitting: Family Medicine

## 2015-08-28 DIAGNOSIS — M7989 Other specified soft tissue disorders: Secondary | ICD-10-CM | POA: Diagnosis not present

## 2015-08-28 DIAGNOSIS — M25561 Pain in right knee: Secondary | ICD-10-CM | POA: Insufficient documentation

## 2015-08-28 DIAGNOSIS — R52 Pain, unspecified: Secondary | ICD-10-CM

## 2015-08-28 NOTE — Progress Notes (Signed)
Preliminary results by tech - Right Lower Ext. Venous Duplex Completed. Negative for deep and superficial vein thrombosis. Rita Sturdivant, BS, RDMS, RVT  

## 2015-09-16 ENCOUNTER — Other Ambulatory Visit (HOSPITAL_BASED_OUTPATIENT_CLINIC_OR_DEPARTMENT_OTHER): Payer: Medicare Other

## 2015-09-16 DIAGNOSIS — D721 Eosinophilia, unspecified: Secondary | ICD-10-CM

## 2015-09-16 DIAGNOSIS — D509 Iron deficiency anemia, unspecified: Secondary | ICD-10-CM | POA: Diagnosis not present

## 2015-09-16 LAB — CBC WITH DIFFERENTIAL/PLATELET
BASO%: 1.1 % (ref 0.0–2.0)
Basophils Absolute: 0.1 10*3/uL (ref 0.0–0.1)
EOS ABS: 0.8 10*3/uL — AB (ref 0.0–0.5)
EOS%: 10.7 % — ABNORMAL HIGH (ref 0.0–7.0)
HCT: 38.4 % (ref 34.8–46.6)
HEMOGLOBIN: 12.5 g/dL (ref 11.6–15.9)
LYMPH%: 23.4 % (ref 14.0–49.7)
MCH: 28.9 pg (ref 25.1–34.0)
MCHC: 32.5 g/dL (ref 31.5–36.0)
MCV: 89.1 fL (ref 79.5–101.0)
MONO#: 0.6 10*3/uL (ref 0.1–0.9)
MONO%: 9.1 % (ref 0.0–14.0)
NEUT#: 3.9 10*3/uL (ref 1.5–6.5)
NEUT%: 55.7 % (ref 38.4–76.8)
Platelets: 316 10*3/uL (ref 145–400)
RBC: 4.32 10*6/uL (ref 3.70–5.45)
RDW: 13.5 % (ref 11.2–14.5)
WBC: 7 10*3/uL (ref 3.9–10.3)
lymph#: 1.6 10*3/uL (ref 0.9–3.3)

## 2015-09-16 LAB — IRON AND TIBC CHCC
%SAT: 41 % (ref 21–57)
Iron: 98 ug/dL (ref 41–142)
TIBC: 236 ug/dL (ref 236–444)
UIBC: 139 ug/dL (ref 120–384)

## 2015-09-16 LAB — FERRITIN CHCC: Ferritin: 124 ng/ml (ref 9–269)

## 2015-09-23 ENCOUNTER — Encounter: Payer: Self-pay | Admitting: Internal Medicine

## 2015-09-23 ENCOUNTER — Ambulatory Visit (HOSPITAL_BASED_OUTPATIENT_CLINIC_OR_DEPARTMENT_OTHER): Payer: Medicare Other | Admitting: Internal Medicine

## 2015-09-23 VITALS — BP 146/58 | HR 75 | Temp 98.3°F | Resp 18 | Ht 62.5 in | Wt 140.9 lb

## 2015-09-23 DIAGNOSIS — D509 Iron deficiency anemia, unspecified: Secondary | ICD-10-CM | POA: Diagnosis not present

## 2015-09-23 DIAGNOSIS — D721 Eosinophilia, unspecified: Secondary | ICD-10-CM

## 2015-09-23 NOTE — Progress Notes (Signed)
[facility] [phone]   [phone]  OFFICE PROGRESS NOTE  [place], MD [address] [facility] [place] [address]  DIAGNOSIS: Iron deficiency anemia with intolerable oral iron intake   PRIOR THERAPY: Status post Feraheme infusion x 2 doses. Last dose was given 03/21/2015  CURRENT THERAPY: None.  INTERVAL HISTORY: Isabel Barnes 74 y.o. female returns to the clinic today for 17-months followup visit. She recently underwent right knee surgery and tolerated well except for ruptured cyst and swelling in that area. She is feeling a little bit better except for pain on the right knee. She denied having any fatigue or weakness. She denied having any dizzy spells. She tolerated the last Feraheme infusion fairly well. The patient has no bleeding, bruises or ecchymosis. She denied having any significant chest pain, shortness of breath, cough or hemoptysis. She had repeat CBC, iron study and ferritin performed recently and she is here for evaluation and discussion of her lab results.   MEDICAL HISTORY: Past Medical History  Diagnosis Date  . Hypothyroidism   . Chronic pain disorder   . Osteoarthritis     lumbar,cervical,joints  . Spondylitis (HCC)   . Complication of anesthesia     severe claustrophobia  . Spinal headache 1991    blood patch placed  . Hypertension   . History of recurrent UTI (urinary tract infection)   . GERD (gastroesophageal reflux disease)   . Umbilical hernia     to see surgeon next week  . Shortness of breath     hx  not used  inhaler for 2-3 months  . Pneumonia     hx  . History of kidney stones   . Anemia     iron deficiency hx    ALLERGIES:  is allergic to dilaudid; gabapentin; lyrica; singulair; banana; ciprofloxacin hcl; codeine; methadone; metronidazole; oysters; sulfa antibiotics; betadine; latex; nucynta; other; penicillins; and sulfur.  MEDICATIONS:  Current Outpatient  Prescriptions  Medication Sig Dispense Refill  . albuterol (PROAIR HFA) 108 (90 BASE) MCG/ACT inhaler Inhale 2 puffs into the lungs every 4 (four) hours as needed for shortness of breath.     Marland Kitchen aspirin EC 81 MG tablet Take 81 mg by mouth every morning.    . Biotin 10 MG CAPS Take 10 mg by mouth daily.     . calcium-vitamin D (OSCAL WITH D) 500-200 MG-UNIT per tablet Take 1 tablet by mouth every morning.     . carboxymethylcellulose (EQ RESTORE TEARS) 0.5 % SOLN Place 1 drop into both eyes daily.    . cholecalciferol (VITAMIN D) 1000 UNITS tablet Take 1,000 Units by mouth daily.     . diclofenac sodium (VOLTAREN) 1 % GEL Apply 2 g topically 4 (four) times daily as needed (For pain.).     Marland Kitchen Digestive Enzymes (ENZYME DIGEST PO) Take 1 tablet by mouth daily.     . diphenhydrAMINE (BENADRYL) 25 MG tablet Take 50 mg by mouth every 6 (six) hours as needed for allergies.    Marland Kitchen estradiol (ESTRACE) 0.5 MG tablet Take 0.5 mg by mouth every other day.     . fish oil-omega-3 fatty acids 1000 MG capsule Take 1,000 mg by mouth 2 (two) times daily.     . furosemide (LASIX) 20 MG tablet Take 10-20 mg by mouth daily.    Marland Kitchen HYDROcodone-acetaminophen (NORCO) 10-325 MG per tablet Take 1 tablet by mouth every 4 (four) hours as needed for severe  pain.     . levothyroxine (SYNTHROID, LEVOTHROID) 50 MCG tablet Take 50 mcg by mouth every morning.     Marland Kitchen losartan (COZAAR) 25 MG tablet Take 25 mg by mouth daily.     . magnesium 30 MG tablet Take 30 mg by mouth daily.     . methocarbamol (ROBAXIN) 750 MG tablet Take 1 tablet (750 mg total) by mouth 4 (four) times daily as needed (use for muscle cramps/pain). 30 tablet 2  . Multiple Vitamins-Minerals (MULTIVITAMIN PO) Take 1 tablet by mouth daily.    Marland Kitchen omeprazole (PRILOSEC) 20 MG capsule Take 20 mg by mouth daily.     Marland Kitchen POTASSIUM PO Take 1 tablet by mouth daily.    . Probiotic Product (PROBIOTIC DAILY PO) Take 1 capsule by mouth daily.     . prochlorperazine (COMPAZINE) 10 MG  tablet Take 5 mg by mouth every 8 (eight) hours as needed (For nausea.).     Marland Kitchen Red Yeast Rice 600 MG CAPS Take 1,200 mg by mouth daily after supper.     . Turmeric 500 MG CAPS Take 500 mg by mouth at bedtime.     . vitamin B-12 (CYANOCOBALAMIN) 250 MCG tablet Take 250 mcg by mouth daily.      No current facility-administered medications for this visit.    SURGICAL HISTORY:  Past Surgical History  Procedure Laterality Date  . Abdominal hysterectomy    . Laparoscopic cholecystectomy w/ cholangiography  2012    Dr Magnus Ivan  . Joint replacement Right 2012    shoulder  . Shoulder arthroscopy w/ rotator cuff repair Bilateral three times each over several yrs  . Cystoscopy w/ ureteroscopy  2012  . Thumb arthroscopy Left   . Nasal sinus surgery    . Anterior fusion cervical spine      x2 -C4-7  . Eye surgery Bilateral     cataract /lens implant  . Holmium laser application Left 02/08/2013    Procedure: HOLMIUM LASER APPLICATION;  Surgeon: Anner Crete, MD;  Location: [facility];  Service: Urology;  Laterality: Left;  . Radiology with anesthesia N/A 05/09/2014    Procedure: ADULT SEDATION WITH ANESTHESIA/MRI CERVICAL SPINE WITHOUT CONTRAST;  Surgeon: Medication Radiologist, MD;  Location: [facility] OR;  Service: Radiology;  Laterality: N/A;  DR. HAWKS/MRI  . Insertion of mesh N/A 07/15/2014    Procedure: INSERTION OF MESH;  Surgeon: Ardeth Sportsman, MD;  Location: [facility] OR;  Service: General;  Laterality: N/A;  . Umbilical hernia repair N/A 07/15/2014    Procedure: LAPAROSCOPIC UMBILICAL AND INFRAUMBILICAL HERNIA;  Surgeon: Ardeth Sportsman, MD;  Location: [facility] OR;  Service: General;  Laterality: N/A;    REVIEW OF SYSTEMS:  A comprehensive review of systems was negative except for: Musculoskeletal: positive for bone pain   PHYSICAL EXAMINATION: General appearance: alert, cooperative and no distress Head: Normocephalic, without obvious abnormality, atraumatic Neck: no adenopathy, no JVD,  supple, symmetrical, trachea midline and thyroid not enlarged, symmetric, no tenderness/mass/nodules Lymph nodes: Cervical, supraclavicular, and axillary nodes normal. Resp: clear to auscultation bilaterally Back: symmetric, no curvature. ROM normal. No CVA tenderness. Cardio: regular rate and rhythm, S1, S2 normal, no murmur, click, rub or gallop GI: soft, non-tender; bowel sounds normal; no masses,  no organomegaly Extremities: extremities normal, atraumatic, no cyanosis or edema  ECOG PERFORMANCE STATUS: 1 - Symptomatic but completely ambulatory  Blood pressure 146/58, pulse 75, temperature 98.3 F (36.8 C), temperature source Oral, resp. rate 18, height 5' 2.5" (1.588 m), weight  140 lb 14.4 oz (63.912 kg), SpO2 100 %.  LABORATORY DATA: Lab Results  Component Value Date   WBC 7.0 09/16/2015   HGB 12.5 09/16/2015   HCT 38.4 09/16/2015   MCV 89.1 09/16/2015   PLT 316 09/16/2015      Chemistry      Component Value Date/Time   NA 140 02/03/2015 1749   K 3.7 02/03/2015 1749   CL 109 02/03/2015 1749   CO2 25 02/03/2015 1749   BUN 14 02/03/2015 1749   CREATININE 0.58 02/03/2015 1749      Component Value Date/Time   CALCIUM 8.8 02/03/2015 1749   ALKPHOS 77 02/03/2015 1749   AST 31 02/03/2015 1749   ALT 25 02/03/2015 1749   BILITOT 0.7 02/03/2015 1749      RADIOGRAPHIC STUDIES: No results found.  ASSESSMENT AND PLAN: This is a very pleasant 74 years old white female with history of iron deficiency anemia status post Feraheme infusion in October of 2013 and has been observation since that time. The patient cannot tolerate oral iron tablets Her hemoglobin and hematocrit as well as the iron study and ferritin are normal. I discussed the lab result with the patient today and recommended for her to continue on observation with routine follow-up visit with her primary care physician. I'll be happy to see her in the future as needed for any drop in her hemoglobin or  hematocrit. She was advised to call immediately she has any concerning symptoms in the interval. The patient voices understanding of current disease status and treatment options and is in agreement with the current care plan.  All questions were answered. The patient knows to call the clinic with any problems, questions or concerns. We can certainly see the patient much sooner if necessary.  Disclaimer: This note was dictated with voice recognition software. Similar sounding words can inadvertently be transcribed and may not be corrected upon review.

## 2015-09-29 ENCOUNTER — Encounter (HOSPITAL_COMMUNITY): Payer: Self-pay | Admitting: Emergency Medicine

## 2015-09-29 ENCOUNTER — Emergency Department (HOSPITAL_COMMUNITY)
Admission: EM | Admit: 2015-09-29 | Discharge: 2015-09-29 | Disposition: A | Discharge status: Home / Self Care | Payer: Medicare Other | Attending: Emergency Medicine | Admitting: Emergency Medicine

## 2015-09-29 ENCOUNTER — Emergency Department (HOSPITAL_COMMUNITY): Payer: Medicare Other

## 2015-09-29 DIAGNOSIS — Y9389 Activity, other specified: Secondary | ICD-10-CM | POA: Diagnosis not present

## 2015-09-29 DIAGNOSIS — Z91040 Latex allergy status: Secondary | ICD-10-CM | POA: Diagnosis not present

## 2015-09-29 DIAGNOSIS — S4992XA Unspecified injury of left shoulder and upper arm, initial encounter: Secondary | ICD-10-CM | POA: Diagnosis not present

## 2015-09-29 DIAGNOSIS — Z79899 Other long term (current) drug therapy: Secondary | ICD-10-CM | POA: Insufficient documentation

## 2015-09-29 DIAGNOSIS — Z87440 Personal history of urinary (tract) infections: Secondary | ICD-10-CM | POA: Diagnosis not present

## 2015-09-29 DIAGNOSIS — Y998 Other external cause status: Secondary | ICD-10-CM | POA: Diagnosis not present

## 2015-09-29 DIAGNOSIS — E039 Hypothyroidism, unspecified: Secondary | ICD-10-CM | POA: Insufficient documentation

## 2015-09-29 DIAGNOSIS — Z880 Allergy status to penicillin: Secondary | ICD-10-CM | POA: Insufficient documentation

## 2015-09-29 DIAGNOSIS — S8991XA Unspecified injury of right lower leg, initial encounter: Secondary | ICD-10-CM | POA: Insufficient documentation

## 2015-09-29 DIAGNOSIS — M79602 Pain in left arm: Secondary | ICD-10-CM

## 2015-09-29 DIAGNOSIS — Z87891 Personal history of nicotine dependence: Secondary | ICD-10-CM | POA: Insufficient documentation

## 2015-09-29 DIAGNOSIS — Z862 Personal history of diseases of the blood and blood-forming organs and certain disorders involving the immune mechanism: Secondary | ICD-10-CM | POA: Diagnosis not present

## 2015-09-29 DIAGNOSIS — K219 Gastro-esophageal reflux disease without esophagitis: Secondary | ICD-10-CM | POA: Diagnosis not present

## 2015-09-29 DIAGNOSIS — I10 Essential (primary) hypertension: Secondary | ICD-10-CM | POA: Insufficient documentation

## 2015-09-29 DIAGNOSIS — Z8701 Personal history of pneumonia (recurrent): Secondary | ICD-10-CM | POA: Insufficient documentation

## 2015-09-29 DIAGNOSIS — M158 Other polyosteoarthritis: Secondary | ICD-10-CM | POA: Diagnosis not present

## 2015-09-29 DIAGNOSIS — G8929 Other chronic pain: Secondary | ICD-10-CM | POA: Insufficient documentation

## 2015-09-29 DIAGNOSIS — Z87442 Personal history of urinary calculi: Secondary | ICD-10-CM | POA: Diagnosis not present

## 2015-09-29 DIAGNOSIS — M25561 Pain in right knee: Secondary | ICD-10-CM

## 2015-09-29 DIAGNOSIS — Y92410 Unspecified street and highway as the place of occurrence of the external cause: Secondary | ICD-10-CM | POA: Insufficient documentation

## 2015-09-29 DIAGNOSIS — Z7982 Long term (current) use of aspirin: Secondary | ICD-10-CM | POA: Diagnosis not present

## 2015-09-29 NOTE — Discharge Instructions (Signed)
Continue taking your home pain medication as directed.  May add in motrin between dosing if needed. Ice and elevate arm and/or knee at home to help with pain/swelling. Follow-up with Dr. Ave Filterhandler tomorrow as scheduled. Return to the ED for new or worsening symptoms.
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
## 2016-01-20 ENCOUNTER — Telehealth: Payer: Self-pay | Admitting: Medical Oncology

## 2016-01-20 NOTE — Telephone Encounter (Signed)
I called Isabel Barnes and she reports that she was " really really really sick with  , Bakers cyst after knee surgery-Sept 2016 and breathing problemes. .  She took oxycodone starting in dec.( chronic pain management ) towards end of dec I started not breathing right-attributes it to oxycodone. Second week of jan,  I was in serious trouble. I took myself off oxycodone and saw Dr Margarita Rana, PA in early feb for Isabel Barnes breathing problem -she just examined me gave me nebulizer and put Isabel Barnes on inhaler and mucinex. Feb 15th I saw Isabel Barnes in followup and  she put me on prednisone taper, said I was improving." I asked Isabel Barnes if [facility] ordered labs  and she said "no".  She thinks she needs to be seen by [facility]. Note to Helen.

## 2016-01-20 NOTE — Telephone Encounter (Addendum)
Pt called to report she is Struggling to walk , sick and needs iron infusion"

## 2016-01-21 ENCOUNTER — Other Ambulatory Visit: Payer: Self-pay | Admitting: Medical Oncology

## 2016-01-21 DIAGNOSIS — D509 Iron deficiency anemia, unspecified: Secondary | ICD-10-CM

## 2016-01-26 ENCOUNTER — Telehealth: Payer: Self-pay | Admitting: Internal Medicine

## 2016-01-26 NOTE — Telephone Encounter (Signed)
Left message for patient to inform her of appt date/time 3/21 at 915 am

## 2016-02-17 ENCOUNTER — Ambulatory Visit (HOSPITAL_BASED_OUTPATIENT_CLINIC_OR_DEPARTMENT_OTHER): Payer: Medicare Other | Admitting: Internal Medicine

## 2016-02-17 ENCOUNTER — Other Ambulatory Visit: Payer: Self-pay | Admitting: Medical Oncology

## 2016-02-17 ENCOUNTER — Other Ambulatory Visit (HOSPITAL_BASED_OUTPATIENT_CLINIC_OR_DEPARTMENT_OTHER): Payer: Medicare Other

## 2016-02-17 VITALS — BP 158/58 | HR 77 | Temp 98.0°F | Resp 17 | Ht 62.5 in | Wt 136.6 lb

## 2016-02-17 DIAGNOSIS — D509 Iron deficiency anemia, unspecified: Secondary | ICD-10-CM | POA: Diagnosis not present

## 2016-02-17 LAB — CBC WITH DIFFERENTIAL/PLATELET
BASO%: 3.2 % — ABNORMAL HIGH (ref 0.0–2.0)
Basophils Absolute: 0.2 10*3/uL — ABNORMAL HIGH (ref 0.0–0.1)
EOS ABS: 0.9 10*3/uL — AB (ref 0.0–0.5)
EOS%: 16 % — ABNORMAL HIGH (ref 0.0–7.0)
HEMATOCRIT: 37.1 % (ref 34.8–46.6)
HGB: 12 g/dL (ref 11.6–15.9)
LYMPH#: 1.6 10*3/uL (ref 0.9–3.3)
LYMPH%: 27.4 % (ref 14.0–49.7)
MCH: 28.4 pg (ref 25.1–34.0)
MCHC: 32.2 g/dL (ref 31.5–36.0)
MCV: 88 fL (ref 79.5–101.0)
MONO#: 0.6 10*3/uL (ref 0.1–0.9)
MONO%: 10.9 % (ref 0.0–14.0)
NEUT%: 42.5 % (ref 38.4–76.8)
NEUTROS ABS: 2.5 10*3/uL (ref 1.5–6.5)
Platelets: 318 10*3/uL (ref 145–400)
RBC: 4.22 10*6/uL (ref 3.70–5.45)
RDW: 15 % — ABNORMAL HIGH (ref 11.2–14.5)
WBC: 5.9 10*3/uL (ref 3.9–10.3)

## 2016-02-17 LAB — COMPREHENSIVE METABOLIC PANEL
ALT: 21 U/L (ref 0–55)
ANION GAP: 6 meq/L (ref 3–11)
AST: 25 U/L (ref 5–34)
Albumin: 3.5 g/dL (ref 3.5–5.0)
Alkaline Phosphatase: 67 U/L (ref 40–150)
BUN: 13.3 mg/dL (ref 7.0–26.0)
CALCIUM: 8.8 mg/dL (ref 8.4–10.4)
CHLORIDE: 109 meq/L (ref 98–109)
CO2: 26 meq/L (ref 22–29)
Creatinine: 0.7 mg/dL (ref 0.6–1.1)
EGFR: 82 mL/min/{1.73_m2} — ABNORMAL LOW (ref >90)
Glucose: 93 mg/dl (ref 70–140)
POTASSIUM: 4.2 meq/L (ref 3.5–5.1)
Sodium: 142 mEq/L (ref 136–145)
Total Bilirubin: 0.72 mg/dL (ref 0.20–1.20)
Total Protein: 5.8 g/dL — ABNORMAL LOW (ref 6.4–8.3)

## 2016-02-17 NOTE — Progress Notes (Signed)
[facility] [phone]   [phone]  OFFICE PROGRESS NOTE  Cheral Bay, MD [address] [facility] [address] [place] [address]  DIAGNOSIS: Iron deficiency anemia with intolerable oral iron intake   PRIOR THERAPY: Status post Feraheme infusion x 2 doses. Last dose was given 03/21/2015  CURRENT THERAPY: None.  INTERVAL HISTORY: Isabel Barnes 75 y.o. female returns to the clinic today for 63-months followup visit. She had significant fatigue and weakness after her knee surgery secondary to pain medication. She also complained of shortness of breath and was treated with inhaler and nebulizer by her primary care physician. She is getting a little bit better and recovering slowly. She denied having any dizzy spells. The patient has no bleeding, bruises or ecchymosis. She denied having any significant chest pain, cough or hemoptysis. She had repeat CBC performed earlier today and she is here for evaluation and discussion of her lab results.   MEDICAL HISTORY: Past Medical History  Diagnosis Date  . Hypothyroidism   . Chronic pain disorder   . Osteoarthritis     lumbar,cervical,joints  . Spondylitis (HCC)   . Complication of anesthesia     severe claustrophobia  . Spinal headache 1991    blood patch placed  . Hypertension   . History of recurrent UTI (urinary tract infection)   . GERD (gastroesophageal reflux disease)   . Umbilical hernia     to see surgeon next week  . Shortness of breath     hx  not used  inhaler for 2-3 months  . Pneumonia     hx  . History of kidney stones   . Anemia     iron deficiency hx    ALLERGIES:  is allergic to dilaudid; gabapentin; lyrica; singulair; banana; ciprofloxacin hcl; codeine; methadone; metronidazole; oysters; sulfa antibiotics; betadine; latex; nucynta; other; penicillins; and sulfur.  MEDICATIONS:  Current Outpatient Prescriptions  Medication Sig Dispense Refill  .  albuterol (PROAIR HFA) 108 (90 BASE) MCG/ACT inhaler Inhale 2 puffs into the lungs every 4 (four) hours as needed for shortness of breath.     Marland Kitchen aspirin EC 81 MG tablet Take 81 mg by mouth every morning.    . Biotin 10 MG CAPS Take 10 mg by mouth daily.     . calcium-vitamin D (OSCAL WITH D) 500-200 MG-UNIT per tablet Take 1 tablet by mouth every morning.     . carboxymethylcellulose (EQ RESTORE TEARS) 0.5 % SOLN Place 1 drop into both eyes daily.    . cholecalciferol (VITAMIN D) 1000 UNITS tablet Take 1,000 Units by mouth daily.     . diclofenac sodium (VOLTAREN) 1 % GEL Apply 2 g topically 2 (two) times daily.     . Digestive Enzymes (ENZYME DIGEST PO) Take 1 tablet by mouth daily.     . diphenhydrAMINE (BENADRYL) 25 MG tablet Take 50 mg by mouth every 6 (six) hours as needed for allergies.    Marland Kitchen estradiol (ESTRACE) 0.5 MG tablet Take 0.5 mg by mouth every other day.     . fish oil-omega-3 fatty acids 1000 MG capsule Take 1,000 mg by mouth 2 (two) times daily.     . furosemide (LASIX) 20 MG tablet Take 10 mg by mouth daily.     Marland Kitchen HYDROcodone-acetaminophen (NORCO) 10-325 MG per tablet Take 1 tablet by mouth every 4 (four) hours as needed for severe pain.     Marland Kitchen levothyroxine (SYNTHROID, LEVOTHROID)  50 MCG tablet Take 50 mcg by mouth every morning.     Marland Kitchen. losartan (COZAAR) 25 MG tablet Take 25 mg by mouth daily.     . magnesium 30 MG tablet Take 30 mg by mouth daily.     . methocarbamol (ROBAXIN) 750 MG tablet Take 1 tablet (750 mg total) by mouth 4 (four) times daily as needed (use for muscle cramps/pain). (Patient not taking: Reported on 09/29/2015) 30 tablet 2  . Multiple Vitamins-Minerals (MULTIVITAMIN PO) Take 1 tablet by mouth daily.    Marland Kitchen. omeprazole (PRILOSEC) 20 MG capsule Take 20 mg by mouth daily.     Marland Kitchen. POTASSIUM PO Take 1 tablet by mouth daily.    . Probiotic Product (PROBIOTIC DAILY PO) Take 1 capsule by mouth daily.     . prochlorperazine (COMPAZINE) 10 MG tablet Take 5 mg by mouth  every 8 (eight) hours as needed (For nausea.).     Marland Kitchen. Red Yeast Rice 600 MG CAPS Take 1,200 mg by mouth daily after supper.     . Turmeric 500 MG CAPS Take 500 mg by mouth at bedtime.     . vitamin B-12 (CYANOCOBALAMIN) 250 MCG tablet Take 250 mcg by mouth at bedtime.      No current facility-administered medications for this visit.    SURGICAL HISTORY:  Past Surgical History  Procedure Laterality Date  . Abdominal hysterectomy    . Laparoscopic cholecystectomy w/ cholangiography  2012    Dr Magnus IvanBlackman  . Joint replacement Right 2012    shoulder  . Shoulder arthroscopy w/ rotator cuff repair Bilateral three times each over several yrs  . Cystoscopy w/ ureteroscopy  2012  . Thumb arthroscopy Left   . Nasal sinus surgery    . Anterior fusion cervical spine      x2 -C4-7  . Eye surgery Bilateral     cataract /lens implant  . Holmium laser application Left 02/08/2013    Procedure: HOLMIUM LASER APPLICATION;  Surgeon: Anner CreteJohn J Wrenn, MD;  [facility];  Service: Urology;  Laterality: Left;  . Radiology with anesthesia N/A 05/09/2014    Procedure: ADULT SEDATION WITH ANESTHESIA/MRI CERVICAL SPINE WITHOUT CONTRAST;  Surgeon: Medication Radiologist, MD;  Location: [facility] OR;  Service: Radiology;  Laterality: N/A;  DR. HAWKS/MRI  . Insertion of mesh N/A 07/15/2014    Procedure: INSERTION OF MESH;  Surgeon: Ardeth SportsmanSteven C. Gross, MD;  Location: [facility] OR;  Service: General;  Laterality: N/A;  . Umbilical hernia repair N/A 07/15/2014    Procedure: LAPAROSCOPIC UMBILICAL AND INFRAUMBILICAL HERNIA;  Surgeon: Ardeth SportsmanSteven C. Gross, MD;  Location: [facility] OR;  Service: General;  Laterality: N/A;    REVIEW OF SYSTEMS:  A comprehensive review of systems was negative except for: Constitutional: positive for fatigue   PHYSICAL EXAMINATION: General appearance: alert, cooperative and no distress Head: Normocephalic, without obvious abnormality, atraumatic Neck: no adenopathy, no JVD, supple, symmetrical,  trachea midline and thyroid not enlarged, symmetric, no tenderness/mass/nodules Lymph nodes: Cervical, supraclavicular, and axillary nodes normal. Resp: clear to auscultation bilaterally Back: symmetric, no curvature. ROM normal. No CVA tenderness. Cardio: regular rate and rhythm, S1, S2 normal, no murmur, click, rub or gallop GI: soft, non-tender; bowel sounds normal; no masses,  no organomegaly Extremities: extremities normal, atraumatic, no cyanosis or edema  ECOG PERFORMANCE STATUS: 1 - Symptomatic but completely ambulatory  Blood pressure 158/58, pulse 77, temperature 98 F (36.7 C), temperature source Oral, resp. rate 17, height 5' 2.5" (1.588 m), weight 136 lb 9.6  oz (61.961 kg), SpO2 98 %.  LABORATORY DATA: Lab Results  Component Value Date   WBC 5.9 02/17/2016   HGB 12.0 02/17/2016   HCT 37.1 02/17/2016   MCV 88.0 02/17/2016   PLT 318 02/17/2016      Chemistry      Component Value Date/Time   NA 140 02/03/2015 1749   K 3.7 02/03/2015 1749   CL 109 02/03/2015 1749   CO2 25 02/03/2015 1749   BUN 14 02/03/2015 1749   CREATININE 0.58 02/03/2015 1749      Component Value Date/Time   CALCIUM 8.8 02/03/2015 1749   ALKPHOS 77 02/03/2015 1749   AST 31 02/03/2015 1749   ALT 25 02/03/2015 1749   BILITOT 0.7 02/03/2015 1749      RADIOGRAPHIC STUDIES: No results found.  ASSESSMENT AND PLAN: This is a very pleasant 75 years old white female with history of iron deficiency anemia status post Feraheme infusion in April of 2016 and has been observation since that time. The patient cannot tolerate oral iron tablets Her hemoglobin and hematocrit are normal. I discussed the lab result with the patient today and recommended for her to continue on observation with routine follow-up visit in 3 months with repeat CBC and iron study.  She was advised to call immediately she has any concerning symptoms in the interval. The patient voices understanding of current disease status and  treatment options and is in agreement with the current care plan.  All questions were answered. The patient knows to call the clinic with any problems, questions or concerns. We can certainly see the patient much sooner if necessary.  Disclaimer: This note was dictated with voice recognition software. Similar sounding words can inadvertently be transcribed and may not be corrected upon review.

## 2016-06-08 ENCOUNTER — Other Ambulatory Visit: Payer: Self-pay | Admitting: Orthopedic Surgery

## 2016-07-02 ENCOUNTER — Ambulatory Visit (HOSPITAL_COMMUNITY)
Admission: RE | Admit: 2016-07-02 | Discharge: 2016-07-02 | Disposition: A | Discharge status: Home / Self Care | Payer: Medicare Other | Source: Ambulatory Visit | Attending: Orthopedic Surgery | Admitting: Orthopedic Surgery

## 2016-07-02 ENCOUNTER — Encounter (HOSPITAL_COMMUNITY)
Admission: RE | Admit: 2016-07-02 | Discharge: 2016-07-02 | Disposition: A | Discharge status: Home / Self Care | Payer: Medicare Other | Source: Ambulatory Visit | Attending: Orthopedic Surgery | Admitting: Orthopedic Surgery

## 2016-07-02 ENCOUNTER — Other Ambulatory Visit: Payer: Self-pay

## 2016-07-02 ENCOUNTER — Encounter (HOSPITAL_COMMUNITY): Payer: Self-pay

## 2016-07-02 DIAGNOSIS — Z01812 Encounter for preprocedural laboratory examination: Secondary | ICD-10-CM | POA: Insufficient documentation

## 2016-07-02 DIAGNOSIS — Z01818 Encounter for other preprocedural examination: Secondary | ICD-10-CM

## 2016-07-02 HISTORY — DX: Personal history of other diseases of urinary system: Z87.448

## 2016-07-02 HISTORY — DX: Cardiac murmur, unspecified: R01.1

## 2016-07-02 HISTORY — DX: Effusion, unspecified joint: M25.40

## 2016-07-02 HISTORY — DX: Personal history of other diseases of the respiratory system: Z87.09

## 2016-07-02 HISTORY — DX: Unsteadiness on feet: R26.81

## 2016-07-02 HISTORY — DX: Urgency of urination: R39.15

## 2016-07-02 HISTORY — DX: Low back pain: M54.5

## 2016-07-02 HISTORY — DX: Other chronic pain: G89.29

## 2016-07-02 HISTORY — DX: Personal history of other infectious and parasitic diseases: Z86.19

## 2016-07-02 HISTORY — DX: Constipation, unspecified: K59.00

## 2016-07-02 HISTORY — DX: Low back pain, unspecified: M54.50

## 2016-07-02 HISTORY — DX: Cervicalgia: M54.2

## 2016-07-02 LAB — COMPREHENSIVE METABOLIC PANEL
ALT: 17 U/L (ref 14–54)
AST: 23 U/L (ref 15–41)
Albumin: 3.7 g/dL (ref 3.5–5.0)
Alkaline Phosphatase: 57 U/L (ref 38–126)
Anion gap: 6 (ref 5–15)
BILIRUBIN TOTAL: 0.3 mg/dL (ref 0.3–1.2)
BUN: 16 mg/dL (ref 6–20)
CHLORIDE: 105 mmol/L (ref 101–111)
CO2: 26 mmol/L (ref 22–32)
CREATININE: 0.71 mg/dL (ref 0.44–1.00)
Calcium: 9.2 mg/dL (ref 8.9–10.3)
Glucose, Bld: 88 mg/dL (ref 65–99)
POTASSIUM: 4.2 mmol/L (ref 3.5–5.1)
Sodium: 137 mmol/L (ref 135–145)
TOTAL PROTEIN: 5.7 g/dL — AB (ref 6.5–8.1)

## 2016-07-02 LAB — TYPE AND SCREEN
ABO/RH(D): A NEG
ANTIBODY SCREEN: NEGATIVE

## 2016-07-02 LAB — CBC WITH DIFFERENTIAL/PLATELET
Basophils Absolute: 0.1 10*3/uL (ref 0.0–0.1)
Basophils Relative: 1 %
EOS PCT: 22 %
Eosinophils Absolute: 1.3 10*3/uL — ABNORMAL HIGH (ref 0.0–0.7)
HCT: 38.6 % (ref 36.0–46.0)
Hemoglobin: 12.1 g/dL (ref 12.0–15.0)
LYMPHS ABS: 1.7 10*3/uL (ref 0.7–4.0)
LYMPHS PCT: 29 %
MCH: 27.6 pg (ref 26.0–34.0)
MCHC: 31.3 g/dL (ref 30.0–36.0)
MCV: 88.1 fL (ref 78.0–100.0)
MONO ABS: 0.6 10*3/uL (ref 0.1–1.0)
MONOS PCT: 10 %
Neutro Abs: 2.3 10*3/uL (ref 1.7–7.7)
Neutrophils Relative %: 38 %
PLATELETS: 303 10*3/uL (ref 150–400)
RBC: 4.38 MIL/uL (ref 3.87–5.11)
RDW: 13.7 % (ref 11.5–15.5)
WBC: 6 10*3/uL (ref 4.0–10.5)

## 2016-07-02 LAB — URINALYSIS, ROUTINE W REFLEX MICROSCOPIC
Bilirubin Urine: NEGATIVE
GLUCOSE, UA: NEGATIVE mg/dL
KETONES UR: NEGATIVE mg/dL
LEUKOCYTES UA: NEGATIVE
Nitrite: NEGATIVE
PH: 6 (ref 5.0–8.0)
Protein, ur: NEGATIVE mg/dL
SPECIFIC GRAVITY, URINE: 1.015 (ref 1.005–1.030)

## 2016-07-02 LAB — SURGICAL PCR SCREEN
MRSA, PCR: NEGATIVE
Staphylococcus aureus: NEGATIVE

## 2016-07-02 LAB — URINE MICROSCOPIC-ADD ON
Bacteria, UA: NONE SEEN
WBC UA: NONE SEEN WBC/hpf (ref 0–5)

## 2016-07-02 LAB — PROTIME-INR
INR: 0.96
PROTHROMBIN TIME: 12.7 s (ref 11.4–15.2)

## 2016-07-02 LAB — APTT: aPTT: 35 seconds (ref 24–36)

## 2016-07-02 MED ORDER — CHLORHEXIDINE GLUCONATE 4 % EX LIQD: 60.0000 mL | Freq: Once | CUTANEOUS | Status: DC

## 2016-07-02 NOTE — Pre-Procedure Instructions (Signed)
NIMISHA RATHEL  07/02/2016      [id number] - [place], [place] - [address] [place] [address] Phone: [phone] Fax: [phone]    Your procedure is scheduled on Fri, Aug 18 @ 10:00 AM  Report to [facility] Admitting at 8:00 AM  Call this number if you have problems the morning of surgery:  [phone]   Remember:  Do not eat food or drink liquids after midnight.  Take these medicines the morning of surgery with A SIP OF WATER Albuterol<Bring Your Inhaler With You>,Pain Pill(if needed),Synthroid(Levothyroxine),Omeprazole(Prilosec),and Compazine(Prochlorperazine-if needed)              Stop taking your Fish Oil,Aspirin,along with any Vitamins or Herbal Medications a week prior to surgery. No Goody's,BC's,Aleve,Advil,Motrin,or Ibuprofen.    Do not wear jewelry, make-up or nail polish.  Do not wear lotions, powders, or perfumes.    Do not shave 48 hours prior to surgery.    Do not bring valuables to the hospital.  [facility] is not responsible for any belongings or valuables.  Contacts, dentures or bridgework may not be worn into surgery.  Leave your suitcase in the car.  After surgery it may be brought to your room.  For patients admitted to the hospital, discharge time will be determined by your treatment team.  Patients discharged the day of surgery will not be allowed to drive home.    [facility]  Before surgery, you can play an important role.  Because skin is not sterile, your skin needs to be as free of germs as possible.  You can reduce the number of germs on you skin by washing with CHG (chlorahexidine gluconate) soap before surgery.  CHG is an antiseptic cleaner which kills germs and bonds with the skin to continue killing germs even after washing.  Please DO NOT use if you have an allergy to CHG or antibacterial soaps.  If your skin becomes reddened/irritated stop using the  CHG and inform your nurse when you arrive at Short Stay.  Do not shave (including legs and underarms) for at least 48 hours prior to the first CHG shower.  You may shave your face.  Please follow these instructions carefully:   1.  Shower with CHG Soap the night before surgery and the                                morning of Surgery.  2.  If you choose to wash your hair, wash your hair first as usual with your       normal shampoo.  3.  After you shampoo, rinse your hair and body thoroughly to remove the                      Shampoo.  4.  Use CHG as you would any other liquid soap.  You can apply chg directly       to the skin and wash gently with scrungie or a clean washcloth.  5.  Apply the CHG Soap to your body ONLY FROM THE NECK DOWN.        Do not use on open wounds or open sores.  Avoid contact with your eyes,       ears, mouth and genitals (private parts).  Wash genitals (private parts)       with your normal  soap.  6.  Wash thoroughly, paying special attention to the area where your surgery        will be performed.  7.  Thoroughly rinse your body with warm water from the neck down.  8.  DO NOT shower/wash with your normal soap after using and rinsing off       the CHG Soap.  9.  Pat yourself dry with a clean towel.            10.  Wear clean pajamas.            11.  Place clean sheets on your bed the night of your first shower and do not        sleep with pets.  Day of Surgery  Do not apply any lotions/deoderants the morning of surgery.  Please wear clean clothes to the hospital/surgery center.     Please read over the following fact sheets that you were given. Pain Booklet and MRSA Information

## 2016-07-02 NOTE — Progress Notes (Addendum)
Cardiologist denies  Sees PA [employer]   Echo denies  Stress test done > 8+ yrs ago  Heart cath denies  EKG denies in past yr  CXR denies in past yr

## 2016-07-15 NOTE — Anesthesia Preprocedure Evaluation (Addendum)
Anesthesia Evaluation  Patient identified by MRN, date of birth, ID band Patient awake    Reviewed: Allergy & Precautions, NPO status , Patient's Chart, lab work & pertinent test results  History of Anesthesia Complications (+) POST - OP SPINAL HEADACHE and history of anesthetic complications (epidural blood patch placed for PDPH in 1991)  Airway Mallampati: II  TM Distance: >3 FB Neck ROM: Full   Comment: Previous grade I view with [employer]  (+) Dental Advisory Given, Teeth Intact   Pulmonary former smoker,    Pulmonary exam normal breath sounds clear to auscultation       Cardiovascular Exercise Tolerance: Good hypertension, (-) Past MI, (-) Cardiac Stents, (-) CABG, (-) Orthopnea and (-) PND  Rhythm:Regular Rate:Normal  EKG 07/02/16: Sinus rhythm with short PR   Neuro/Psych claustrophobicUnsteady gait    GI/Hepatic Neg liver ROS, GERD  Medicated and Controlled,  Endo/Other  neg diabetesHypothyroidism   Renal/GU H/o nephrolithiasis  Female GU complaint (urgency)     Musculoskeletal  (+) Arthritis , Osteoarthritis,  Chronic back pain, neck pain   Abdominal (+) - obese,   Peds  Hematology  (+) Blood dyscrasia (iron deficiency), anemia ,   Anesthesia Other Findings claustrophobia  Reproductive/Obstetrics                            Anesthesia Physical Anesthesia Plan  ASA: III  Anesthesia Plan: General   Post-op Pain Management:    Induction: Intravenous  Airway Management Planned: Oral ETT  Additional Equipment:   Intra-op Plan:   Post-operative Plan: Extubation in OR  Informed Consent: I have reviewed the patients History and Physical, chart, labs and discussed the procedure including the risks, benefits and alternatives for the proposed anesthesia with the patient or authorized representative who has indicated his/her understanding and acceptance.   Dental advisory  given  Plan Discussed with:   Anesthesia Plan Comments: (Patient declines neuraxial anesthesia given her history of PDPH requiring epidural blood patch.  Hgb 12.1 Platelets 303 INR 0.96)       Anesthesia Quick Evaluation

## 2016-07-16 ENCOUNTER — Inpatient Hospital Stay (HOSPITAL_COMMUNITY): Payer: Medicare Other | Admitting: Anesthesiology

## 2016-07-16 ENCOUNTER — Inpatient Hospital Stay (HOSPITAL_COMMUNITY)
Admission: RE | Admit: 2016-07-16 | Discharge: 2016-07-18 | DRG: 470 | Disposition: A | Discharge status: Home Health | Payer: Medicare Other | Source: Ambulatory Visit | Attending: Orthopedic Surgery | Admitting: Orthopedic Surgery

## 2016-07-16 ENCOUNTER — Encounter (HOSPITAL_COMMUNITY)
Admission: RE | Disposition: A | Discharge status: Home Health | Payer: Self-pay | Source: Ambulatory Visit | Attending: Orthopedic Surgery

## 2016-07-16 DIAGNOSIS — Z79899 Other long term (current) drug therapy: Secondary | ICD-10-CM

## 2016-07-16 DIAGNOSIS — Z7982 Long term (current) use of aspirin: Secondary | ICD-10-CM | POA: Diagnosis not present

## 2016-07-16 DIAGNOSIS — E039 Hypothyroidism, unspecified: Secondary | ICD-10-CM | POA: Diagnosis present

## 2016-07-16 DIAGNOSIS — K219 Gastro-esophageal reflux disease without esophagitis: Secondary | ICD-10-CM | POA: Diagnosis present

## 2016-07-16 DIAGNOSIS — I10 Essential (primary) hypertension: Secondary | ICD-10-CM | POA: Diagnosis present

## 2016-07-16 DIAGNOSIS — M25561 Pain in right knee: Secondary | ICD-10-CM | POA: Diagnosis present

## 2016-07-16 DIAGNOSIS — M1711 Unilateral primary osteoarthritis, right knee: Secondary | ICD-10-CM | POA: Diagnosis present

## 2016-07-16 DIAGNOSIS — Z87891 Personal history of nicotine dependence: Secondary | ICD-10-CM | POA: Diagnosis not present

## 2016-07-16 DIAGNOSIS — Z96611 Presence of right artificial shoulder joint: Secondary | ICD-10-CM | POA: Diagnosis present

## 2016-07-16 DIAGNOSIS — D509 Iron deficiency anemia, unspecified: Secondary | ICD-10-CM

## 2016-07-16 DIAGNOSIS — Z981 Arthrodesis status: Secondary | ICD-10-CM

## 2016-07-16 HISTORY — PX: TOTAL KNEE ARTHROPLASTY: SHX125

## 2016-07-16 SURGERY — ARTHROPLASTY, KNEE, TOTAL
Anesthesia: General | Site: Knee | Laterality: Right

## 2016-07-16 MED ORDER — PROPOFOL 10 MG/ML IV BOLUS
INTRAVENOUS | Status: AC
  Filled 2016-07-16: qty 20

## 2016-07-16 MED ORDER — FENTANYL CITRATE (PF) 100 MCG/2ML IJ SOLN
INTRAMUSCULAR | Status: AC
  Administered 2016-07-16: 50 ug
  Filled 2016-07-16: qty 2

## 2016-07-16 MED ORDER — BUPIVACAINE HCL (PF) 0.5 % IJ SOLN
INTRAMUSCULAR | Status: DC | PRN
  Administered 2016-07-16: 20 mL

## 2016-07-16 MED ORDER — 0.9 % SODIUM CHLORIDE (POUR BTL) OPTIME
TOPICAL | Status: DC | PRN
  Administered 2016-07-16: 1000 mL

## 2016-07-16 MED ORDER — BUPIVACAINE-EPINEPHRINE (PF) 0.5% -1:200000 IJ SOLN
INTRAMUSCULAR | Status: DC | PRN
  Administered 2016-07-16: 30 mL via PERINEURAL

## 2016-07-16 MED ORDER — ACETAMINOPHEN 650 MG RE SUPP: 650.0000 mg | Freq: Four times a day (QID) | RECTAL | Status: DC | PRN

## 2016-07-16 MED ORDER — FENTANYL CITRATE (PF) 100 MCG/2ML IJ SOLN
INTRAMUSCULAR | Status: AC
  Filled 2016-07-16: qty 2

## 2016-07-16 MED ORDER — DOCUSATE SODIUM 100 MG PO CAPS
100.0000 mg | ORAL_CAPSULE | Freq: Two times a day (BID) | ORAL | Status: DC
  Administered 2016-07-16 – 2016-07-18 (×4): 100 mg via ORAL
  Filled 2016-07-16 (×4): qty 1

## 2016-07-16 MED ORDER — MIDAZOLAM HCL 5 MG/5ML IJ SOLN
INTRAMUSCULAR | Status: DC | PRN
Start: 2016-07-16 — End: 2016-07-16
  Administered 2016-07-16 (×2): 1 mg via INTRAVENOUS

## 2016-07-16 MED ORDER — FENTANYL CITRATE (PF) 100 MCG/2ML IJ SOLN
INTRAMUSCULAR | Status: DC | PRN
  Administered 2016-07-16 (×3): 50 ug via INTRAVENOUS
  Administered 2016-07-16: 25 ug via INTRAVENOUS
  Administered 2016-07-16: 75 ug via INTRAVENOUS
  Administered 2016-07-16: 50 ug via INTRAVENOUS

## 2016-07-16 MED ORDER — SUGAMMADEX SODIUM 200 MG/2ML IV SOLN
INTRAVENOUS | Status: DC | PRN
  Administered 2016-07-16: 125 mg via INTRAVENOUS

## 2016-07-16 MED ORDER — DEXAMETHASONE SODIUM PHOSPHATE 10 MG/ML IJ SOLN
INTRAMUSCULAR | Status: AC
  Filled 2016-07-16: qty 1

## 2016-07-16 MED ORDER — BUPIVACAINE LIPOSOME 1.3 % IJ SUSP
20.0000 mL | INTRAMUSCULAR | Status: AC
  Administered 2016-07-16: 20 mL
  Filled 2016-07-16: qty 20

## 2016-07-16 MED ORDER — ESTRADIOL 1 MG PO TABS
0.5000 mg | ORAL_TABLET | ORAL | Status: DC
  Administered 2016-07-18: 0.5 mg via ORAL
  Filled 2016-07-16: qty 0.5

## 2016-07-16 MED ORDER — ONDANSETRON HCL 4 MG/2ML IJ SOLN
INTRAMUSCULAR | Status: AC
  Filled 2016-07-16: qty 2

## 2016-07-16 MED ORDER — LIDOCAINE 2% (20 MG/ML) 5 ML SYRINGE
INTRAMUSCULAR | Status: AC
  Filled 2016-07-16: qty 5

## 2016-07-16 MED ORDER — ONDANSETRON HCL 4 MG/2ML IJ SOLN: 4.0000 mg | Freq: Four times a day (QID) | INTRAMUSCULAR | Status: DC | PRN

## 2016-07-16 MED ORDER — CLINDAMYCIN PHOSPHATE 900 MG/50ML IV SOLN
INTRAVENOUS | Status: AC
  Filled 2016-07-16: qty 50

## 2016-07-16 MED ORDER — MIDAZOLAM HCL 2 MG/2ML IJ SOLN
INTRAMUSCULAR | Status: AC
  Filled 2016-07-16: qty 2

## 2016-07-16 MED ORDER — FENTANYL CITRATE (PF) 100 MCG/2ML IJ SOLN
INTRAMUSCULAR | Status: AC
  Administered 2016-07-16: 50 ug via INTRAVENOUS
  Filled 2016-07-16: qty 2

## 2016-07-16 MED ORDER — MAGNESIUM CITRATE PO SOLN: 1.0000 | Freq: Once | ORAL | Status: DC | PRN

## 2016-07-16 MED ORDER — LOSARTAN POTASSIUM 25 MG PO TABS
25.0000 mg | ORAL_TABLET | Freq: Every day | ORAL | Status: DC
  Administered 2016-07-18: 25 mg via ORAL
  Filled 2016-07-16 (×2): qty 1

## 2016-07-16 MED ORDER — HYDROCODONE-ACETAMINOPHEN 10-325 MG PO TABS
1.0000 | ORAL_TABLET | ORAL | Status: DC | PRN
  Administered 2016-07-16 – 2016-07-18 (×8): 2 via ORAL
  Filled 2016-07-16 (×8): qty 2

## 2016-07-16 MED ORDER — ROCURONIUM BROMIDE 100 MG/10ML IV SOLN
INTRAVENOUS | Status: DC | PRN
  Administered 2016-07-16: 30 mg via INTRAVENOUS
  Administered 2016-07-16: 20 mg via INTRAVENOUS

## 2016-07-16 MED ORDER — METHOCARBAMOL 500 MG PO TABS
ORAL_TABLET | ORAL | Status: AC
  Filled 2016-07-16: qty 2

## 2016-07-16 MED ORDER — PHENYLEPHRINE 40 MCG/ML (10ML) SYRINGE FOR IV PUSH (FOR BLOOD PRESSURE SUPPORT)
PREFILLED_SYRINGE | INTRAVENOUS | Status: AC
  Filled 2016-07-16: qty 10

## 2016-07-16 MED ORDER — BISACODYL 5 MG PO TBEC: 5.0000 mg | DELAYED_RELEASE_TABLET | Freq: Every day | ORAL | Status: DC | PRN

## 2016-07-16 MED ORDER — METHOCARBAMOL 750 MG PO TABS
750.0000 mg | ORAL_TABLET | Freq: Four times a day (QID) | ORAL | Status: DC | PRN
  Administered 2016-07-16 – 2016-07-18 (×5): 750 mg via ORAL
  Filled 2016-07-16 (×4): qty 1

## 2016-07-16 MED ORDER — PANTOPRAZOLE SODIUM 40 MG PO TBEC
40.0000 mg | DELAYED_RELEASE_TABLET | Freq: Every day | ORAL | Status: DC
  Administered 2016-07-17 – 2016-07-18 (×2): 40 mg via ORAL
  Filled 2016-07-16 (×2): qty 1

## 2016-07-16 MED ORDER — LEVOTHYROXINE SODIUM 50 MCG PO TABS
50.0000 ug | ORAL_TABLET | Freq: Every morning | ORAL | Status: DC
  Administered 2016-07-17 – 2016-07-18 (×2): 50 ug via ORAL
  Filled 2016-07-16 (×2): qty 1

## 2016-07-16 MED ORDER — HYDROCODONE-ACETAMINOPHEN 7.5-325 MG PO TABS
ORAL_TABLET | ORAL | Status: AC
  Administered 2016-07-16: 2
  Filled 2016-07-16: qty 2

## 2016-07-16 MED ORDER — DEXAMETHASONE SODIUM PHOSPHATE 10 MG/ML IJ SOLN
10.0000 mg | Freq: Two times a day (BID) | INTRAMUSCULAR | Status: AC
  Administered 2016-07-16 – 2016-07-17 (×3): 10 mg via INTRAVENOUS
  Filled 2016-07-16 (×3): qty 1

## 2016-07-16 MED ORDER — ACETAMINOPHEN 325 MG PO TABS: 650.0000 mg | ORAL_TABLET | Freq: Four times a day (QID) | ORAL | Status: DC | PRN

## 2016-07-16 MED ORDER — CLINDAMYCIN PHOSPHATE 900 MG/50ML IV SOLN
900.0000 mg | INTRAVENOUS | Status: AC
  Administered 2016-07-16: 900 mg via INTRAVENOUS

## 2016-07-16 MED ORDER — HYDROCODONE-ACETAMINOPHEN 10-325 MG PO TABS: 1.0000 | ORAL_TABLET | Freq: Four times a day (QID) | ORAL | 0 refills | Status: DC | PRN

## 2016-07-16 MED ORDER — SODIUM CHLORIDE 0.9 % IV SOLN: INTRAVENOUS | Status: DC

## 2016-07-16 MED ORDER — CLINDAMYCIN PHOSPHATE 600 MG/50ML IV SOLN
600.0000 mg | Freq: Four times a day (QID) | INTRAVENOUS | Status: AC
  Administered 2016-07-16 – 2016-07-17 (×2): 600 mg via INTRAVENOUS
  Filled 2016-07-16 (×2): qty 50

## 2016-07-16 MED ORDER — MORPHINE SULFATE (PF) 2 MG/ML IV SOLN
INTRAVENOUS | Status: AC
  Administered 2016-07-16: 1 mg via INTRAVENOUS
  Filled 2016-07-16: qty 1

## 2016-07-16 MED ORDER — ASPIRIN EC 325 MG PO TBEC: 325.0000 mg | DELAYED_RELEASE_TABLET | Freq: Two times a day (BID) | ORAL | 0 refills | Status: DC

## 2016-07-16 MED ORDER — POLYETHYLENE GLYCOL 3350 17 G PO PACK: 17.0000 g | PACK | Freq: Every day | ORAL | Status: DC | PRN

## 2016-07-16 MED ORDER — ALBUTEROL SULFATE (2.5 MG/3ML) 0.083% IN NEBU: 3.0000 mL | INHALATION_SOLUTION | RESPIRATORY_TRACT | Status: DC | PRN

## 2016-07-16 MED ORDER — MORPHINE SULFATE (PF) 2 MG/ML IV SOLN
1.0000 mg | INTRAVENOUS | Status: AC | PRN
  Administered 2016-07-16 (×3): 1 mg via INTRAVENOUS

## 2016-07-16 MED ORDER — DEXAMETHASONE SODIUM PHOSPHATE 10 MG/ML IJ SOLN
INTRAMUSCULAR | Status: DC | PRN
  Administered 2016-07-16: 5 mg via INTRAVENOUS

## 2016-07-16 MED ORDER — ALUM & MAG HYDROXIDE-SIMETH 200-200-20 MG/5ML PO SUSP: 30.0000 mL | ORAL | Status: DC | PRN

## 2016-07-16 MED ORDER — BUPIVACAINE HCL (PF) 0.5 % IJ SOLN
INTRAMUSCULAR | Status: AC
  Filled 2016-07-16: qty 30

## 2016-07-16 MED ORDER — LACTATED RINGERS IV SOLN
INTRAVENOUS | Status: DC
  Administered 2016-07-16: 12:00:00 via INTRAVENOUS
  Administered 2016-07-16: 50 mL/h via INTRAVENOUS
  Administered 2016-07-16: 13:00:00 via INTRAVENOUS

## 2016-07-16 MED ORDER — DIPHENHYDRAMINE HCL 12.5 MG/5ML PO ELIX
12.5000 mg | ORAL_SOLUTION | ORAL | Status: DC | PRN
  Administered 2016-07-16: 25 mg via ORAL
  Filled 2016-07-16 (×2): qty 10

## 2016-07-16 MED ORDER — ZOLPIDEM TARTRATE 5 MG PO TABS: 5.0000 mg | ORAL_TABLET | Freq: Every evening | ORAL | Status: DC | PRN

## 2016-07-16 MED ORDER — ONDANSETRON HCL 4 MG/2ML IJ SOLN
INTRAMUSCULAR | Status: DC | PRN
  Administered 2016-07-16: 4 mg via INTRAVENOUS

## 2016-07-16 MED ORDER — PROPOFOL 10 MG/ML IV BOLUS
INTRAVENOUS | Status: DC | PRN
  Administered 2016-07-16: 80 mg via INTRAVENOUS
  Administered 2016-07-16: 30 mg via INTRAVENOUS

## 2016-07-16 MED ORDER — LIDOCAINE HCL (CARDIAC) 20 MG/ML IV SOLN
INTRAVENOUS | Status: DC | PRN
  Administered 2016-07-16: 80 mg via INTRAVENOUS

## 2016-07-16 MED ORDER — ASPIRIN EC 325 MG PO TBEC
325.0000 mg | DELAYED_RELEASE_TABLET | Freq: Two times a day (BID) | ORAL | Status: DC
  Administered 2016-07-16 – 2016-07-18 (×4): 325 mg via ORAL
  Filled 2016-07-16 (×4): qty 1

## 2016-07-16 MED ORDER — MORPHINE SULFATE (PF) 2 MG/ML IV SOLN
2.0000 mg | INTRAVENOUS | Status: AC | PRN
  Administered 2016-07-16 (×2): 2 mg via INTRAVENOUS

## 2016-07-16 MED ORDER — TRANEXAMIC ACID 1000 MG/10ML IV SOLN
1000.0000 mg | Freq: Once | INTRAVENOUS | Status: DC
  Filled 2016-07-16 (×2): qty 10

## 2016-07-16 MED ORDER — MORPHINE SULFATE (PF) 2 MG/ML IV SOLN
INTRAVENOUS | Status: AC
  Administered 2016-07-16: 2 mg via INTRAVENOUS
  Filled 2016-07-16: qty 1

## 2016-07-16 MED ORDER — FENTANYL CITRATE (PF) 100 MCG/2ML IJ SOLN
25.0000 ug | INTRAMUSCULAR | Status: DC | PRN
  Administered 2016-07-16 (×3): 50 ug via INTRAVENOUS

## 2016-07-16 MED ORDER — ONDANSETRON HCL 4 MG PO TABS: 4.0000 mg | ORAL_TABLET | Freq: Four times a day (QID) | ORAL | Status: DC | PRN

## 2016-07-16 MED ORDER — MORPHINE SULFATE (PF) 2 MG/ML IV SOLN
1.0000 mg | INTRAVENOUS | Status: DC | PRN
  Administered 2016-07-16 – 2016-07-18 (×7): 2 mg via INTRAVENOUS
  Filled 2016-07-16 (×7): qty 1

## 2016-07-16 MED ORDER — PHENYLEPHRINE HCL 10 MG/ML IJ SOLN
INTRAMUSCULAR | Status: DC | PRN
  Administered 2016-07-16 (×3): 80 ug via INTRAVENOUS

## 2016-07-16 MED ORDER — TRANEXAMIC ACID 1000 MG/10ML IV SOLN
1000.0000 mg | INTRAVENOUS | Status: AC
  Administered 2016-07-16: 1000 mg via INTRAVENOUS
  Filled 2016-07-16: qty 10

## 2016-07-16 MED ORDER — ONDANSETRON HCL 4 MG/2ML IJ SOLN: 4.0000 mg | Freq: Once | INTRAMUSCULAR | Status: DC | PRN

## 2016-07-16 SURGICAL SUPPLY — 62 items
BANDAGE ESMARK 6X9 LF (GAUZE/BANDAGES/DRESSINGS) ×1 IMPLANT
BENZOIN TINCTURE PRP APPL 2/3 (GAUZE/BANDAGES/DRESSINGS) ×2 IMPLANT
BLADE SAGITTAL 25.0X1.19X90 (BLADE) ×2 IMPLANT
BLADE SAW SAG 90X13X1.27 (BLADE) ×2 IMPLANT
BNDG ESMARK 6X9 LF (GAUZE/BANDAGES/DRESSINGS) ×2
BOWL SMART MIX CTS (DISPOSABLE) ×2 IMPLANT
CAPT KNEE TOTAL 3 ATTUNE ×2 IMPLANT
CATH FOLEY LATEX FREE 16FR (CATHETERS) ×1
CATH FOLEY LF 16FR (CATHETERS) ×1 IMPLANT
CEMENT HV SMART SET (Cement) ×4 IMPLANT
CHLORAPREP W/TINT 26ML (MISCELLANEOUS) ×4 IMPLANT
COVER SURGICAL LIGHT HANDLE (MISCELLANEOUS) ×2 IMPLANT
CUFF TOURNIQUET SINGLE 34IN LL (TOURNIQUET CUFF) ×2 IMPLANT
CUFF TOURNIQUET SINGLE 44IN (TOURNIQUET CUFF) IMPLANT
DRAPE EXTREMITY T 121X128X90 (DRAPE) ×2 IMPLANT
DRAPE IMP U-DRAPE 54X76 (DRAPES) ×2 IMPLANT
DRAPE U-SHAPE 47X51 STRL (DRAPES) ×2 IMPLANT
DRESSING AQUACEL AG SP 3.5X10 (GAUZE/BANDAGES/DRESSINGS) ×1 IMPLANT
DRSG AQUACEL AG ADV 3.5X10 (GAUZE/BANDAGES/DRESSINGS) IMPLANT
DRSG AQUACEL AG SP 3.5X10 (GAUZE/BANDAGES/DRESSINGS) ×2
DRSG MEPILEX BORDER 4X12 (GAUZE/BANDAGES/DRESSINGS) IMPLANT
DRSG PAD ABDOMINAL 8X10 ST (GAUZE/BANDAGES/DRESSINGS) ×2 IMPLANT
DURAPREP 26ML APPLICATOR (WOUND CARE) IMPLANT
ELECT REM PT RETURN 9FT ADLT (ELECTROSURGICAL) ×2
ELECTRODE REM PT RTRN 9FT ADLT (ELECTROSURGICAL) ×1 IMPLANT
EVACUATOR 1/8 PVC DRAIN (DRAIN) ×2 IMPLANT
FACESHIELD WRAPAROUND (MASK) ×2 IMPLANT
GAUZE SPONGE 4X4 12PLY STRL (GAUZE/BANDAGES/DRESSINGS) ×2 IMPLANT
GLOVE BIOGEL PI IND STRL 8 (GLOVE) ×3 IMPLANT
GLOVE BIOGEL PI INDICATOR 8 (GLOVE) ×3
GLOVE ECLIPSE 7.5 STRL STRAW (GLOVE) ×4 IMPLANT
GOWN STRL REUS W/ TWL LRG LVL3 (GOWN DISPOSABLE) ×1 IMPLANT
GOWN STRL REUS W/ TWL XL LVL3 (GOWN DISPOSABLE) ×3 IMPLANT
GOWN STRL REUS W/TWL LRG LVL3 (GOWN DISPOSABLE) ×1
GOWN STRL REUS W/TWL XL LVL3 (GOWN DISPOSABLE) ×3
HANDPIECE INTERPULSE COAX TIP (DISPOSABLE) ×1
HOOD PEEL AWAY FACE SHEILD DIS (HOOD) ×6 IMPLANT
IMMOBILIZER KNEE 22 UNIV (SOFTGOODS) ×2 IMPLANT
KIT BASIN OR (CUSTOM PROCEDURE TRAY) ×2 IMPLANT
KIT ROOM TURNOVER OR (KITS) ×2 IMPLANT
MANIFOLD NEPTUNE II (INSTRUMENTS) ×2 IMPLANT
NEEDLE SPNL 22GX3.5 QUINCKE BK (NEEDLE) ×2 IMPLANT
NS IRRIG 1000ML POUR BTL (IV SOLUTION) ×2 IMPLANT
PACK TOTAL JOINT (CUSTOM PROCEDURE TRAY) ×2 IMPLANT
PACK UNIVERSAL I (CUSTOM PROCEDURE TRAY) ×2 IMPLANT
PAD ARMBOARD 7.5X6 YLW CONV (MISCELLANEOUS) ×4 IMPLANT
PAD CAST 4YDX4 CTTN HI CHSV (CAST SUPPLIES) ×1 IMPLANT
PADDING CAST COTTON 4X4 STRL (CAST SUPPLIES) ×1
SET HNDPC FAN SPRY TIP SCT (DISPOSABLE) ×1 IMPLANT
STAPLER VISISTAT 35W (STAPLE) IMPLANT
STRIP CLOSURE SKIN 1/2X4 (GAUZE/BANDAGES/DRESSINGS) ×4 IMPLANT
SUCTION FRAZIER HANDLE 10FR (MISCELLANEOUS)
SUCTION TUBE FRAZIER 10FR DISP (MISCELLANEOUS) IMPLANT
SUT MNCRL AB 3-0 PS2 18 (SUTURE) IMPLANT
SUT VIC AB 0 CTB1 27 (SUTURE) ×4 IMPLANT
SUT VIC AB 1 CT1 27 (SUTURE) ×2
SUT VIC AB 1 CT1 27XBRD ANBCTR (SUTURE) ×2 IMPLANT
SUT VIC AB 2-0 CTB1 (SUTURE) ×4 IMPLANT
SYR 50ML LL SCALE MARK (SYRINGE) ×2 IMPLANT
TOWEL OR 17X24 6PK STRL BLUE (TOWEL DISPOSABLE) ×2 IMPLANT
TOWEL OR 17X26 10 PK STRL BLUE (TOWEL DISPOSABLE) ×2 IMPLANT
WRAP KNEE MAXI GEL POST OP (GAUZE/BANDAGES/DRESSINGS) ×2 IMPLANT

## 2016-07-16 NOTE — Anesthesia Postprocedure Evaluation (Signed)
Anesthesia Post Note  Patient: Clayburn Pertatricia W Hunnell  Procedure(s) Performed: Procedure(s) (LRB): RIGHT TOTAL KNEE ARTHROPLASTY (Right)  Patient location during evaluation: PACU Anesthesia Type: General Level of consciousness: awake and alert Pain management: pain level controlled Vital Signs Assessment: post-procedure vital signs reviewed and stable Respiratory status: spontaneous breathing, nonlabored ventilation, respiratory function stable and patient connected to nasal cannula oxygen Cardiovascular status: blood pressure returned to baseline and stable Postop Assessment: no signs of nausea or vomiting Anesthetic complications: no    Last Vitals:  Vitals:   07/16/16 1445 07/16/16 1515  BP: (!) 169/100 (!) 155/122  Pulse: 83 80  Resp: 15 14  Temp:      Last Pain:  Vitals:   07/16/16 1305  TempSrc:   PainSc: 6                  Linton RumpJennifer Dickerson Javonna Balli

## 2016-07-16 NOTE — Progress Notes (Signed)
Pt has multiple allergies & is aware that pain control is currently & will continue to be challenging. Staying in contact with anesthesia in attempt to handle pain management as well as possible. P.o. meds have been given. New order for Morphine IV. Will continue to reassess.

## 2016-07-16 NOTE — Anesthesia Procedure Notes (Signed)
Anesthesia Regional Block:  Adductor canal block  Pre-Anesthetic Checklist: ,, timeout performed, Correct Patient, Correct Site, Correct Laterality, Correct Procedure, Correct Position, site marked, Risks and benefits discussed,  Surgical consent,  Pre-op evaluation,  At surgeon's request and post-op pain management  Laterality: Right  Prep: chloraprep       Needles:   Needle Type: Echogenic Stimulator Needle     Needle Length: 9cm 9 cm Needle Gauge: 21 and 21 G    Additional Needles:  Procedures: ultrasound guided (picture in chart) Adductor canal block Narrative:  Start time: 07/16/2016 9:13 AM End time: 07/16/2016 9:15 AM Injection made incrementally with aspirations every 5 mL.  Performed by: Personally  Anesthesiologist: Linton RumpALLAN, Milton Sagona DICKERSON

## 2016-07-16 NOTE — H&P (Signed)
TOTAL KNEE ADMISSION H&P  Patient is being admitted for right total knee arthroplasty.  Subjective:  Chief Complaint:right knee pain.  HPI: Isabel Barnes, 75 y.o. female, has a history of pain and functional disability in the right knee due to arthritis and has failed non-surgical conservative treatments for greater than 12 weeks to includeNSAID's and/or analgesics, corticosteriod injections, viscosupplementation injections, use of assistive devices, weight reduction as appropriate and activity modification.  Onset of symptoms was gradual, starting 5 years ago with gradually worsening course since that time. The patient noted no past surgery on the right knee(s).  Patient currently rates pain in the right knee(s) at 8 out of 10 with activity. Patient has night pain, worsening of pain with activity and weight bearing, pain that interferes with activities of daily living, pain with passive range of motion, crepitus and joint swelling.  Patient has evidence of subchondral sclerosis, periarticular osteophytes and joint space narrowing by imaging studies. This patient has had Failure of all reasonable conservative care. There is no active infection.  Patient Active Problem List   Diagnosis Date Noted  . 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 Chronic pain disorder   . Osteoarthritis   . Incisional umbilical hernia, without obstruction or gangrene   . Iron deficiency anemia 09/19/2012   Past Medical History:  Diagnosis Date  . Anemia    iron deficiency hx.has had iron infusions before   . Chronic low back pain   . Chronic pain disorder   . Complication of anesthesia    severe claustrophobia  . Constipation    r/t use of pain meds.Takes OTC meds or eats prunes  . GERD (gastroesophageal reflux disease)    takes Omeprazole daily  . Heart murmur    "slight"  . History of bronchitis    20+ yrs ago  . History of kidney stones   . History of prolapse of bladder   . History of shingles   . Hypertension     takes Losartan daily  . Hypothyroidism    takes Synthroid daily  . Joint swelling   . Neck pain    bone spurs at base of head per pt  . Osteoarthritis    lumbar,cervical,joints  . Pneumonia    hx of > 20 yrs ago  . Shortness of breath    occasionally and with exertion. Albuterol inhaler as needed  . Spinal headache 1991   blood patch placed  . 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 Unsteady gait    occasionally  . Urinary urgency     Past Surgical History:  Procedure Laterality Date  . ABDOMINAL HYSTERECTOMY    . ANTERIOR FUSION CERVICAL SPINE     x2 -C4-7  . BUNIONECTOMY Bilateral   . COLONOSCOPY    . CYSTOSCOPY W/ URETEROSCOPY  2012  . EYE SURGERY Bilateral    cataract /lens implant  . HOLMIUM LASER APPLICATION Left 08/13/568   Procedure: HOLMIUM LASER APPLICATION;  Surgeon: Malka So, MD;  Location: [name];  Service: Urology;  Laterality: Left;  . INSERTION OF MESH N/A 07/15/2014   Procedure: INSERTION OF MESH;  Surgeon: Adin Hector, MD;  Location: [place];  Service: General;  Laterality: N/A;  . JOINT REPLACEMENT Right 2012   shoulder  . LAPAROSCOPIC CHOLECYSTECTOMY W/ CHOLANGIOGRAPHY  2012   Dr Ninfa Linden  . NASAL SINUS SURGERY    . RADIOLOGY WITH ANESTHESIA N/A 05/09/2014   Procedure: ADULT SEDATION WITH ANESTHESIA/MRI CERVICAL SPINE WITHOUT CONTRAST;  Surgeon: Medication Radiologist, MD;  Location: [place];  Service: Radiology;  Laterality: N/A;  DR. HAWKS/MRI  . right knee arthroscopy     d/t meniscal tear  . SHOULDER ARTHROSCOPY W/ ROTATOR CUFF REPAIR Bilateral three times each over several yrs  . THUMB ARTHROSCOPY Left   . UMBILICAL HERNIA REPAIR N/A 07/15/2014   Procedure: LAPAROSCOPIC UMBILICAL AND INFRAUMBILICAL HERNIA;  Surgeon: Adin Hector, MD;  Location: [place];  Service: General;  Laterality: N/A;    Prescriptions Prior to Admission  Medication Sig Dispense Refill Last Dose  . albuterol (PROAIR HFA) 108 (90 BASE) MCG/ACT inhaler Inhale 2 puffs  into the lungs every 4 (four) hours as needed for shortness of breath.    07/15/2016 at Unknown time  . aspirin EC 81 MG tablet Take 81 mg by mouth every morning.   10 days  . Biotin 10 MG CAPS Take 10 mg by mouth daily.    10 days  . calcium-vitamin D (OSCAL WITH D) 500-200 MG-UNIT per tablet Take 1 tablet by mouth every morning.    10 days  . carboxymethylcellulose (EQ RESTORE TEARS) 0.5 % SOLN Place 1 drop into both eyes daily.   07/16/2016 at Unknown time  . cholecalciferol (VITAMIN D) 1000 UNITS tablet Take 1,000 Units by mouth daily.    10 days  . diclofenac sodium (VOLTAREN) 1 % GEL Apply 2 g topically 2 (two) times daily as needed (For pain.).    Past Week at Unknown time  . Digestive Enzymes (ENZYME DIGEST PO) Take 1 tablet by mouth daily.    10 days  . diphenhydrAMINE (BENADRYL) 25 MG tablet Take 25 mg by mouth every 6 (six) hours as needed for allergies.    07/16/2016 at Unknown time  . estradiol (ESTRACE) 0.5 MG tablet Take 0.5 mg by mouth every other day.    07/16/2016 at Unknown time  . fish oil-omega-3 fatty acids 1000 MG capsule Take 1,000 mg by mouth 2 (two) times daily.    10 days  . HYDROcodone-acetaminophen (NORCO) 10-325 MG per tablet Take 1 tablet by mouth every 4 (four) hours as needed for severe pain.    07/16/2016 at Unknown time  . levothyroxine (SYNTHROID, LEVOTHROID) 50 MCG tablet Take 50 mcg by mouth every morning.    07/16/2016 at Unknown time  . losartan (COZAAR) 25 MG tablet Take 25 mg by mouth daily.    07/16/2016 at Unknown time  . magnesium 30 MG tablet Take 30 mg by mouth daily.    10 days  . methocarbamol (ROBAXIN) 750 MG tablet Take 1 tablet (750 mg total) by mouth 4 (four) times daily as needed (use for muscle cramps/pain). 30 tablet 2 Past Month at Unknown time  . Multiple Vitamins-Minerals (MULTIVITAMIN PO) Take 1 tablet by mouth daily.   10 days  . omeprazole (PRILOSEC) 20 MG capsule Take 20 mg by mouth daily.    07/16/2016 at Unknown time  . POTASSIUM PO Take 1  tablet by mouth daily.   10 days  . Probiotic Product (PROBIOTIC DAILY PO) Take 1 capsule by mouth daily.    10 days  . prochlorperazine (COMPAZINE) 10 MG tablet Take 5 mg by mouth every 8 (eight) hours as needed (For nausea.).    Past Week at Unknown time  . Red Yeast Rice 600 MG CAPS Take 1,200 mg by mouth daily after supper.    10 days  . Turmeric 500 MG CAPS Take 500 mg by mouth at bedtime.    10 days  .  vitamin B-12 (CYANOCOBALAMIN) 250 MCG tablet Take 250 mcg by mouth at bedtime.    10 days   Allergies  Allergen Reactions  . Dilaudid [Hydromorphone Hcl] Shortness Of Breath  . Gabapentin Other (See Comments)    Hoarseness , headache and sore throat  . Lyrica [Pregabalin] Other (See Comments)    No balance , had to walk with cane , Blurred vision,weakness.  . Oxycodone Shortness Of Breath    Cannot breathe.  . Singulair [Montelukast Sodium] Shortness Of Breath  . Banana Other (See Comments)    Abdominal cramping Cantaloupe also  . Ciprofloxacin Hcl Nausea And Vomiting and Other (See Comments)    Nausea and vomiting in po form  . Codeine Other (See Comments)    hallucinations  . Methadone Nausea And Vomiting    severe  . Metronidazole Nausea And Vomiting and Other (See Comments)    Gastric pain  . Oysters [Shellfish Allergy] Other (See Comments)    "Terrible gastric upset and cramping."  . Clindamycin/Lincomycin Diarrhea and Nausea Only  . Sulfa Antibiotics   . Betadine [Povidone Iodine] Rash and Other (See Comments)    Oyster shell products-rash  . Latex Rash  . Nucynta [Tapentadol] Other (See Comments)    Other reaction(s): Other (See Comments) nightmares nightmares  . Other Rash    All Antibiotic ointments/ creams  . Penicillins Nausea And Vomiting and Rash    Has patient had a PCN reaction causing immediate rash, facial/tongue/throat swelling, SOB or lightheadedness with hypotension: Yes Has patient had a PCN reaction causing severe rash involving mucus membranes or  skin necrosis: No Has patient had a PCN reaction that required hospitalization No Has patient had a PCN reaction occurring within the last 10 years: No If all of the above answers are "NO", then may proceed with Cephalosporin use.   . Sulfur Nausea And Vomiting    Social History  Substance Use Topics  . Smoking status: Former Smoker    Packs/day: 0.50    Years: 2.00    Types: Cigarettes    Quit date: 02/07/1992  . Smokeless tobacco: Never Used  . Alcohol use No    Family History  Problem Relation Age of Onset  . Stroke Father      ROS ROS: I have reviewed the patient's review of systems thoroughly and there are no positive responses as relates to the HPI. Objective:  Physical Exam  Vital signs in last 24 hours: Temp:  [98 F (36.7 C)] 98 F (36.7 C) (08/18 0834) Pulse Rate:  [75-77] 75 (08/18 0925) Resp:  [12-19] 15 (08/18 0925) BP: (155-188)/(59-69) 156/59 (08/18 0925) SpO2:  [97 %-100 %] 100 % (08/18 0925) Weight:  [61.7 kg (136 lb 1.6 oz)] 61.7 kg (136 lb 1.6 oz) (08/18 0834) Well-developed well-nourished patient in no acute distress. Alert and oriented x3 HEENT:within normal limits Cardiac: Regular rate and rhythm Pulmonary: Lungs clear to auscultation Abdomen: Soft and nontender.  Normal active bowel sounds  Musculoskeletal: (Right knee: Limited range of motion. Painful range of motion. No instability. Neurovascularly intact distally. Labs: Recent Results (from the past 2160 hour(s))  Surgical pcr screen     Status: None   Collection Time: 07/02/16 11:02 AM  Result Value Ref Range   MRSA, PCR NEGATIVE NEGATIVE   Staphylococcus aureus NEGATIVE NEGATIVE    Comment:        The Xpert SA Assay (FDA approved for NASAL specimens in patients over 32 years of age), is one component of a comprehensive  surveillance program.  Test performance has been validated by [facility] for patients greater than or equal to 34 year old. It is not intended to diagnose  infection nor to guide or monitor treatment.   APTT     Status: None   Collection Time: 07/02/16 11:03 AM  Result Value Ref Range   aPTT 35 24 - 36 seconds  CBC WITH DIFFERENTIAL     Status: Abnormal   Collection Time: 07/02/16 11:03 AM  Result Value Ref Range   WBC 6.0 4.0 - 10.5 K/uL   RBC 4.38 3.87 - 5.11 MIL/uL   Hemoglobin 12.1 12.0 - 15.0 g/dL   HCT 38.6 36.0 - 46.0 %   MCV 88.1 78.0 - 100.0 fL   MCH 27.6 26.0 - 34.0 pg   MCHC 31.3 30.0 - 36.0 g/dL   RDW 13.7 11.5 - 15.5 %   Platelets 303 150 - 400 K/uL   Neutrophils Relative % 38 %   Neutro Abs 2.3 1.7 - 7.7 K/uL   Lymphocytes Relative 29 %   Lymphs Abs 1.7 0.7 - 4.0 K/uL   Monocytes Relative 10 %   Monocytes Absolute 0.6 0.1 - 1.0 K/uL   Eosinophils Relative 22 %   Eosinophils Absolute 1.3 (H) 0.0 - 0.7 K/uL   Basophils Relative 1 %   Basophils Absolute 0.1 0.0 - 0.1 K/uL  Comprehensive metabolic panel     Status: Abnormal   Collection Time: 07/02/16 11:03 AM  Result Value Ref Range   Sodium 137 135 - 145 mmol/L   Potassium 4.2 3.5 - 5.1 mmol/L   Chloride 105 101 - 111 mmol/L   CO2 26 22 - 32 mmol/L   Glucose, Bld 88 65 - 99 mg/dL   BUN 16 6 - 20 mg/dL   Creatinine, Ser 0.71 0.44 - 1.00 mg/dL   Calcium 9.2 8.9 - 10.3 mg/dL   Total Protein 5.7 (L) 6.5 - 8.1 g/dL   Albumin 3.7 3.5 - 5.0 g/dL   AST 23 15 - 41 U/L   ALT 17 14 - 54 U/L   Alkaline Phosphatase 57 38 - 126 U/L   Total Bilirubin 0.3 0.3 - 1.2 mg/dL   GFR calc non Af Amer >60 >60 mL/min   GFR calc Af Amer >60 >60 mL/min    Comment: (NOTE) The eGFR has been calculated using the CKD EPI equation. This calculation has not been validated in all clinical situations. eGFR's persistently <60 mL/min signify possible Chronic Kidney Disease.    Anion gap 6 5 - 15  Protime-INR     Status: None   Collection Time: 07/02/16 11:03 AM  Result Value Ref Range   Prothrombin Time 12.7 11.4 - 15.2 seconds   INR 0.96   Urinalysis, Routine w reflex microscopic  (not at [facility])     Status: Abnormal   Collection Time: 07/02/16 11:03 AM  Result Value Ref Range   Color, Urine YELLOW YELLOW   APPearance CLEAR CLEAR   Specific Gravity, Urine 1.015 1.005 - 1.030   pH 6.0 5.0 - 8.0   Glucose, UA NEGATIVE NEGATIVE mg/dL   Hgb urine dipstick TRACE (A) NEGATIVE   Bilirubin Urine NEGATIVE NEGATIVE   Ketones, ur NEGATIVE NEGATIVE mg/dL   Protein, ur NEGATIVE NEGATIVE mg/dL   Nitrite NEGATIVE NEGATIVE   Leukocytes, UA NEGATIVE NEGATIVE  Urine microscopic-add on     Status: Abnormal   Collection Time: 07/02/16 11:03 AM  Result Value Ref Range   Squamous Epithelial / LPF  0-5 (A) NONE SEEN   WBC, UA NONE SEEN 0 - 5 WBC/hpf   RBC / HPF 0-5 0 - 5 RBC/hpf   Bacteria, UA NONE SEEN NONE SEEN  Type and screen Order type and screen if day of surgery is less than 15 days from draw of preadmission visit or order morning of surgery if day of surgery is greater than 6 days from preadmission visit.     Status: None   Collection Time: 07/02/16 11:21 AM  Result Value Ref Range   ABO/RH(D) A NEG    Antibody Screen NEG    Sample Expiration 07/16/2016    Extend sample reason NO TRANSFUSIONS OR PREGNANCY IN THE PAST 3 MONTHS    Estimated body mass index is 24.89 kg/m as calculated from the following:   Height as of this encounter: _0  (1.575 m).   Weight as of this encounter: 61.7 kg (136 lb 1.6 oz).   Imaging Review Plain radiographs demonstrate severe degenerative joint disease of the right knee(s). The overall alignment ismild varus. The bone quality appears to be fair for age and reported activity level.  Assessment/Plan:  End stage arthritis, right knee   The patient history, physical examination, clinical judgment of the provider and imaging studies are consistent with end stage degenerative joint disease of the right knee(s) and total knee arthroplasty is deemed medically necessary. The treatment options including medical management, injection therapy  arthroscopy and arthroplasty were discussed at length. The risks and benefits of total knee arthroplasty were presented and reviewed. The risks due to aseptic loosening, infection, stiffness, patella tracking problems, thromboembolic complications and other imponderables were discussed. The patient acknowledged the explanation, agreed to proceed with the plan and consent was signed. Patient is being admitted for inpatient treatment for surgery, pain control, PT, OT, prophylactic antibiotics, VTE prophylaxis, progressive ambulation and ADL's and discharge planning. The patient is planning to be discharged home with home health services

## 2016-07-16 NOTE — Progress Notes (Signed)
Orthopedic Tech Progress Note Patient Details:  Isabel Barnes 09/13/1941 [id number]  CPM Right Knee CPM Right Knee: On Right Knee Flexion (Degrees): 70 Right Knee Extension (Degrees): 0 Additional Comments: Trapeze bar   Isabel Barnes 07/16/2016, 3:52 PM

## 2016-07-16 NOTE — Anesthesia Procedure Notes (Addendum)
Procedure Name: Intubation Date/Time: 07/16/2016 11:10 AM Performed by: Army FossaPULLIAM, Reed Dady DANE Pre-anesthesia Checklist: Patient identified, Emergency Drugs available, Suction available and Patient being monitored Patient Re-evaluated:Patient Re-evaluated prior to inductionOxygen Delivery Method: Circle System Utilized Preoxygenation: Pre-oxygenation with 100% oxygen Intubation Type: IV induction Ventilation: Mask ventilation without difficulty Laryngoscope Size: Glidescope and 4 Grade View: Grade I Tube type: Oral Tube size: 7.0 mm Number of attempts: 1 Airway Equipment and Method: Oral airway,  Rigid stylet and Video-laryngoscopy Placement Confirmation: positive ETCO2 and breath sounds checked- equal and bilateral (Video confirmation of ETT passing through open vocal cords. ) Secured at: 20 cm Tube secured with: Tape Dental Injury: Teeth and Oropharynx as per pre-operative assessment  Difficulty Due To: Difficult Airway- due to reduced neck mobility

## 2016-07-16 NOTE — Progress Notes (Signed)
Pt is resting quietly. Able to sleep @ times. Pain is "much more tolerable".

## 2016-07-16 NOTE — Transfer of Care (Signed)
Immediate Anesthesia Transfer of Care Note  Patient: Isabel Barnes  Procedure(s) Performed: Procedure(s): RIGHT TOTAL KNEE ARTHROPLASTY (Right)  Patient Location: PACU  Anesthesia Type:General  Level of Consciousness:  sedated, patient cooperative and responds to stimulation  Airway & Oxygen Therapy:Patient Spontanous Breathing and Patient connected to face mask oxgen  Post-op Assessment:  Report given to PACU RN and Post -op Vital signs reviewed and stable  Post vital signs:  Reviewed and stable  Last Vitals:  Vitals:   07/16/16 0920 07/16/16 0925  BP:  (!) 156/59  Pulse: 77 75  Resp: 19 15  Temp:      Complications: No apparent anesthesia complications

## 2016-07-16 NOTE — Discharge Instructions (Signed)

## 2016-07-17 LAB — BASIC METABOLIC PANEL
Anion gap: 7 (ref 5–15)
BUN: 10 mg/dL (ref 6–20)
CHLORIDE: 106 mmol/L (ref 101–111)
CO2: 25 mmol/L (ref 22–32)
CREATININE: 0.63 mg/dL (ref 0.44–1.00)
Calcium: 8.7 mg/dL — ABNORMAL LOW (ref 8.9–10.3)
GFR calc Af Amer: >60 mL/min (ref >60)
GFR calc non Af Amer: >60 mL/min (ref >60)
Glucose, Bld: 157 mg/dL — ABNORMAL HIGH (ref 65–99)
Potassium: 4.1 mmol/L (ref 3.5–5.1)
Sodium: 138 mmol/L (ref 135–145)

## 2016-07-17 LAB — CBC
HEMATOCRIT: 34.2 % — AB (ref 36.0–46.0)
HEMOGLOBIN: 10.9 g/dL — AB (ref 12.0–15.0)
MCH: 28 pg (ref 26.0–34.0)
MCHC: 31.9 g/dL (ref 30.0–36.0)
MCV: 87.9 fL (ref 78.0–100.0)
Platelets: 240 10*3/uL (ref 150–400)
RBC: 3.89 MIL/uL (ref 3.87–5.11)
RDW: 13.5 % (ref 11.5–15.5)
WBC: 8.8 10*3/uL (ref 4.0–10.5)

## 2016-07-17 NOTE — Progress Notes (Signed)
Orthopedic Tech Progress Note Patient Details:  Isabel Barnes 06/18/1941 [id number]  [name], female   DOB: 08/28/1941, 75 y.o.   MRN: [id number] Applied cpm 0-50. Couldn't go higher due to pain. Called for pain meds.  Trinna PostMartinez, [name] J 07/17/2016, 6:01 AM

## 2016-07-17 NOTE — Progress Notes (Signed)
Physical Therapy Treatment Patient Details Name: Isabel Barnes MRN: [id number] DOB: 01/08/1941 Today's Date: 07/17/2016    History of Present Illness 75 yo female, post Right TKA due to OA on 07/16/16.  Prior hx includes: R hip OA, multiple spinal surgeries, hx R shoulder replacement, chronic pain.    PT Comments    Patient demonstrating continued improvement from this morning, pain still a factor.  MIN Guard to Supervision overall, R knee measured ROM 0-80 degrees, set CPM for 0-70 degrees per patient tolerance.  Emerging step-through gait pattern with gait, including up/down 2 steps.  Patient continues to progress, continue.  Follow Up Recommendations  Home health PT;Supervision/Assistance - 24 hour     Equipment Recommendations  None recommended by PT (Patient reports having equipment already)    Recommendations for Other Services       Precautions / Restrictions Precautions Precautions: Knee Precaution Booklet Issued: Yes (comment) Precaution Comments: Exercise handout issued / instructed Required Braces or Orthoses: Knee Immobilizer - Right Knee Immobilizer - Right: On when out of bed or walking Restrictions Weight Bearing Restrictions: Yes RLE Weight Bearing: Weight bearing as tolerated    Mobility  Bed Mobility Overal bed mobility: Needs Assistance Bed Mobility: Supine to Sit;Sit to Supine     Supine to sit: Supervision Sit to supine: Supervision   General bed mobility comments: Patient able to lift leg in and out of bed  Transfers Overall transfer level: Needs assistance Equipment used: Rolling walker (2 wheeled) Transfers: Sit to/from Stand Sit to Stand: Min guard         General transfer comment: Cues for technique with KI, hand placement.  Ambulation/Gait Ambulation/Gait assistance: Min guard;Supervision Ambulation Distance (Feet): 75 Feet Assistive device: Rolling walker (2 wheeled) Gait Pattern/deviations: Step-through pattern;Antalgic    Gait velocity interpretation: Below normal speed for age/gender General Gait Details: Circumduction due to KI, slower pace due to pain   Stairs Stairs: Yes Stairs assistance: Min guard Stair Management: Two rails;Step to pattern Number of Stairs: 2 General stair comments: Instruction in step-to pattern  Wheelchair Mobility    Modified Rankin (Stroke Patients Only)       Balance     Sitting balance-Leahy Scale: Good     Standing balance support: No upper extremity supported Standing balance-Leahy Scale: Fair                      Cognition Arousal/Alertness: Awake/alert Behavior During Therapy: WFL for tasks assessed/performed Overall Cognitive Status: Within Functional Limits for tasks assessed                      Exercises Total Joint Exercises Ankle Circles/Pumps: AAROM;Both;20 reps;Supine Long Arc Quad: AAROM;Right;5 reps;Seated Knee Flexion: AAROM;Right;10 reps;Seated Goniometric ROM: 0-80    General Comments        Pertinent Vitals/Pain Pain Assessment: 0-10 Pain Score: 7  Pain Location: Right knee Pain Descriptors / Indicators: Aching;Sharp;Sore Pain Intervention(s): Premedicated before session;Monitored during session    Home Living                      Prior Function            PT Goals (current goals can now be found in the care plan section) Acute Rehab PT Goals Patient Stated Goal: To go home with daughter to assist until better. PT Goal Formulation: With patient Time For Goal Achievement: 07/31/16 Potential to Achieve Goals: Good Progress towards PT goals: Progressing toward  goals    Frequency  BID    PT Plan Current plan remains appropriate    Co-evaluation             End of Session Equipment Utilized During Treatment: Gait belt Activity Tolerance: Patient tolerated treatment well Patient left: in bed;in CPM;with call bell/phone within reach     Time: 1435-1510 PT Time Calculation (min)  (ACUTE ONLY): 35 min  Charges:  $Gait Training: 8-22 mins $Therapeutic Exercise: 8-22 mins                    G Codes:      Django Nguyen L 07/17/2016, 3:32 PM

## 2016-07-17 NOTE — Care Management Note (Signed)
Case Management Note  Patient Details  Name: Isabel Barnes MRN: [id number] Date of Birth: 30-Mar-1941  Subjective/Objective: 75 yo F s/p R TKA                   Action/Plan: received referral to assist with [facility] needs, DME   Expected Discharge Date: 07/18/16                  Expected Discharge Plan:  [place]  In-House Referral:     Discharge planning Services  CM Consult  Post Acute Care Choice:    Choice offered to:  Adult Children  DME Arranged:    DME Agency:     HH Arranged:  PT HH Agency:  [facility]  Status of Service:  Completed, signed off  If discussed at [place] of Stay Meetings, dates discussed:    Additional Comments: met with pt and daughter [name]). D/C plan is to return home with the support of her daughter. DME delivered: RW, 3-in-1 BSC, and a CPM. She has a cane. Provided them with a list of [place] agencies. They prefer to use [facility]. Contacted [name] at [facility] for referral. Pt prefers a female therapist and [name] made aware.  [name], RN 07/17/2016, 11:43 AM

## 2016-07-17 NOTE — Op Note (Signed)
NAMWallace Keller:  Luz, Yulianna             ACCOUNT NO.:  [id number]  MEDICAL RECORD NO.:  [id number]  LOCATION:  5N01C                        FACILITY:  [facility]  PHYSICIAN:  Harvie JuniorJohn L. Junella Domke, M.D.   DATE OF BIRTH:  1941/04/16  DATE OF PROCEDURE:  07/16/2016 DATE OF DISCHARGE:                              OPERATIVE REPORT   PREOPERATIVE DIAGNOSIS:  End-stage degenerative joint disease, right knee.  POSTOPERATIVE DIAGNOSIS:  End-stage degenerative joint disease, right knee.  PROCEDURE:  Right total knee replacement with a 10 system size 4 femur, size 4 tibia, size 7 bridging bearing, and a 35 mm all-polyethylene patella.  SURGEON:  Harvie JuniorJohn L. Rondall Radigan, M.D.  Threasa HeadsASSISTANOrma Flaming:  Bethune.  ANESTHESIA:  General.  BRIEF HISTORY:  Ms. Hale Bogusorter is a 75 year old female with long history of significant complaints of right knee pain.  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 She had some fray on the lateral tibial plateau and some are found on the kneecap.  We evaluated her and thought her knee could be appropriate, but felt that total knee replacement was not inappropriate either she was brought to the operating room for total knee replacement.  DESCRIPTION OF PROCEDURE:  The patient was brought to the operating room.  After adequate level of anesthesia was obtained with general anesthetic, the patient was placed supine on the operating room table. The right leg was then prepped and draped in the usual sterile fashion. Following this, the leg was exsanguinated.  Blood pressure tourniquet was inflated to 300 mmHg.  Following this, a midline incision was made in the subcutaneous tissue down the level of the extensor mechanism and a medial parapatellar arthrotomy was undertaken.  The attention was then turned to the knee where anterior and posterior cruciates were excised, and medial and lateral meniscus, retropatellar fat pad and synovium on the  anterior aspect of the femur.  Following this, intramedullary pilot hole was drilled.  The 10 system was used to get a 5-degree valgus and inclination cut with 9 mm of distal bone.  Once this was completed, attention was turned towards sizing the femur, sized to a 4, anterior and posterior cuts were made, chamfers and box.  Attention then turned to the tibia which was cut perpendicular to its long axis with the extramedullary guide.  Once this was done, spacer blocks were put in place.  A 7 mm seemed appropriate in the anterior and posterior direction as well as extension.  Once this was done, attention was turned towards putting the trials and then a trial 7 was placed.  It was a little bit tight in extension, and so we went to a 6, it seemed to be better in extension and flexion and had good stability.  Attention was then turned to the patellofemoral joint where she was noted to have the patella was cut down to a level of 13 mm and lugs were drilled. Following this, 35 poly patella was chosen, and knee put through a range of motion, and excellent stability and range of motion were achieved. Attention turned back towards the knee where the trials were removed. The knee was copiously and thoroughly irrigated  with pulsatile lavage, irrigation and suctioned dry.  The final components were then cemented into place.  Size 4 femur, size 4 tibia, 7 mm bridging bearing was placed and the knee held in 10 degrees of flexion while the cement was allowed to harden, and a 35 all-poly patella was placed and held with a clamp.  Following this, excess cement was removed.  Exparel was instilled throughout the capsular structures and subcutaneous fat for postoperative anesthesia.  At this point, the tourniquet was let down. All bleeding was controlled with electrocautery.  We made a check of the 6 and 7.  We had done some releasing to get better balance.  At this point, a 7 seemed to be appropriate, so a 7  was placed and then had the medial parapatellar arthrotomy closed and the knee was closed with 0 and 2-0 Vicryl, and 3-0 Monocryl subcuticular.  Benzoin and Steri-Strips were applied.  Sterile compressive dressing was applied and the patient was taken to the recovery, was noted to be in satisfactory condition. Estimated blood loss for the procedure was amenable.     Harvie JuniorJohn L. Adylin Hankey, M.D.     Ranae PlumberJLG/MEDQ  D:  07/17/2016  T:  07/17/2016  Job:  [id number]

## 2016-07-17 NOTE — Progress Notes (Signed)
Subjective: 1 Day Post-Op Procedure(s) (LRB): RIGHT TOTAL KNEE ARTHROPLASTY (Right) Patient reports pain as moderate.  Taking by mouth.  Had not voided as of 8:30 this a.m.  Complains of moderate knee pain.  Objective: Vital signs in last 24 hours: Temp:  [97.5 F (36.4 C)-98.6 F (37 C)] 97.5 F (36.4 C) (08/19 0558) Pulse Rate:  [69-88] 77 (08/19 1115) Resp:  [10-22] 16 (08/19 1115) BP: (103-186)/(45-122) 103/45 (08/19 1115) SpO2:  [95 %-100 %] 96 % (08/19 1115)  Intake/Output from previous day: 08/18 0701 - 08/19 0700 In: 2100 [I.V.:2100] Out: 3875 [Urine:3800; Blood:75] Intake/Output this shift: No intake/output data recorded.   Recent Labs  07/17/16 0413  HGB 10.9*    Recent Labs  07/17/16 0413  WBC 8.8  RBC 3.89  HCT 34.2*  PLT 240    Recent Labs  07/17/16 0413  NA 138  K 4.1  CL 106  CO2 25  BUN 10  CREATININE 0.63  GLUCOSE 157*  CALCIUM 8.7*   No results for input(s): LABPT, INR in the last 72 hours. Right knee exam: Neurovascular intact Sensation intact distally Intact pulses distally Dorsiflexion/Plantar flexion intact Incision: dressing C/D/I Compartment soft  Assessment/Plan: 1 Day Post-Op Procedure(s) (LRB): RIGHT TOTAL KNEE ARTHROPLASTY (Right)  Plan: Up with physical therapy.  Weightbearing as tolerated on right. Aspirin 325 mg enteric-coated twice daily with SCDs for DVT prophylaxis. Probable discharge home tomorrow with home health PT.  Matthew FolksBETHUNE,Alima Naser G 07/17/2016, 12:31 PM

## 2016-07-17 NOTE — Progress Notes (Signed)
Physical Therapy Evaluation Patient Details Name: Isabel Barnes MRN: [id number] DOB: 09/01/1941 Today's Date: 07/17/2016   History of Present Illness  75 yo female, post Right TKA due to OA on 07/16/16.  Prior hx includes: R hip OA, multiple spinal surgeries, hx R shoulder replacement, chronic pain.  Clinical Impression  Patient presents with pain following Right TKA yesterday, agreeable to therapy.  Overall good mobility, MIN assist for transfers and room level mobility.  Time spent with exercise instruction and handout provided, education for knee immobilizer use, zero-knee use, and calling for assistance at this time.  Knee ROM visually 0-75 degrees this morning, will get goniometric measure this afternoon.  Patient will benefit from continuation of skilled PT services to increase mobility independence, manage pain, and improve strength and ROM.  Anticipate home with daughter to assist 24 hours initially when medically appropriate and continue with Home Health PT at that time.    Follow Up Recommendations Home health PT;Supervision/Assistance - 24 hour    Equipment Recommendations       Recommendations for Other Services       Precautions / Restrictions Precautions Precautions: Knee Precaution Booklet Issued: Yes (comment) Precaution Comments: Exercise handout issued / instructed Required Braces or Orthoses: Knee Immobilizer - Right Knee Immobilizer - Right: On when out of bed or walking Restrictions Weight Bearing Restrictions: Yes RLE Weight Bearing: Weight bearing as tolerated      Mobility  Bed Mobility Overal bed mobility: Needs Assistance Bed Mobility: Supine to Sit     Supine to sit: Min assist     General bed mobility comments: Assist for R LE management and pain  Transfers Overall transfer level: Needs assistance Equipment used: Rolling walker (2 wheeled) Transfers: Sit to/from Stand Sit to Stand: Min assist         General transfer comment: Cues for  technique with KI, hand placement.  Ambulation/Gait Ambulation/Gait assistance: Min guard Ambulation Distance (Feet): 20 Feet Assistive device: Rolling walker (2 wheeled) Gait Pattern/deviations: Step-to pattern;Antalgic   Gait velocity interpretation: Below normal speed for age/gender    Stairs            Wheelchair Mobility    Modified Rankin (Stroke Patients Only)       Balance Overall balance assessment: Needs assistance   Sitting balance-Leahy Scale: Good     Standing balance support: Single extremity supported Standing balance-Leahy Scale: Poor                               Pertinent Vitals/Pain Pain Assessment: 0-10 Pain Score: 8  Pain Location: Right knee Pain Descriptors / Indicators: Aching;Sharp;Sore Pain Intervention(s): Premedicated before session;Monitored during session;Repositioned    Home Living Family/patient expects to be discharged to:: Private residence Living Arrangements: Alone Available Help at Discharge: Family Type of Home: House Home Access: Ramped entrance     Home Layout: One level Home Equipment: Cane - single point;Bedside commode;Walker - 2 wheels Additional Comments: Daughter will stay with patient at home until able to be independent    Prior Function Level of Independence: Independent with assistive device(s)         Comments: Multiple chronic issues, has modifications for living.     Hand Dominance        Extremity/Trunk Assessment   Upper Extremity Assessment: Defer to OT evaluation;Overall [facility] for tasks assessed           Lower Extremity Assessment: Overall WFL for tasks  assessed;RLE deficits/detail RLE Deficits / Details: Decreased ROM in knee, painful    Cervical / Trunk Assessment: Kyphotic (History of Cervical and Lumbar spinal surgeries.)  Communication   Communication: No difficulties  Cognition Arousal/Alertness: Awake/alert Behavior During Therapy: WFL for tasks  assessed/performed Overall Cognitive Status: Within Functional Limits for tasks assessed                      General Comments General comments (skin integrity, edema, etc.): R knee wrapped, not visualized.    Exercises Total Joint Exercises Ankle Circles/Pumps: AAROM;Both;20 reps;Supine Quad Sets: AROM;Both;10 reps;Supine Towel Squeeze: AROM;Both;5 reps;Supine Heel Slides: AAROM;Both;10 reps;Supine Hip ABduction/ADduction: AAROM;Both;10 reps;Supine Straight Leg Raises: AROM;Right;10 reps;Supine Long Arc Quad: AAROM;Right;5 reps;Seated      Assessment/Plan    PT Assessment Patient needs continued PT services  PT Diagnosis Difficulty walking;Acute pain   PT Problem List Decreased strength;Decreased range of motion;Decreased activity tolerance;Decreased mobility;Pain  PT Treatment Interventions Gait training;Functional mobility training;Therapeutic activities;Therapeutic exercise;Patient/family education;Manual techniques   PT Goals (Current goals can be found in the Care Plan section) Acute Rehab PT Goals Patient Stated Goal: To go home with daughter to assist until better. PT Goal Formulation: With patient Time For Goal Achievement: 07/31/16 Potential to Achieve Goals: Good    Frequency BID   Barriers to discharge        Co-evaluation               End of Session Equipment Utilized During Treatment: Gait belt Activity Tolerance: Patient tolerated treatment well Patient left: in chair;with call bell/phone within reach Nurse Communication: Mobility status;Patient requests pain meds         Time: 0915-1015 PT Time Calculation (min) (ACUTE ONLY): 60 min   Charges:   PT Evaluation $PT Eval Low Complexity: 1 Procedure PT Treatments $Gait Training: 8-22 mins $Therapeutic Exercise: 8-22 mins $Therapeutic Activity: 8-22 mins   PT G Codes:        Isabel Barnes 07/17/2016, 10:36 AM

## 2016-07-17 NOTE — Brief Op Note (Signed)
07/16/2016  2:14 PM  PATIENT:  Isabel Barnes  75 y.o. female  PRE-OPERATIVE DIAGNOSIS:  Osteoarthritis right knee   POST-OPERATIVE DIAGNOSIS:  Osteoarthritis right knee   PROCEDURE:  Procedure(s): RIGHT TOTAL KNEE ARTHROPLASTY (Right)  SURGEON:  Surgeon(s) and Role:    * Jodi GeraldsJohn Valeria Krisko, MD - Primary  PHYSICIAN ASSISTANT:   ASSISTANTS: bethune  ANESTHESIA:   general  EBL:  No intake/output data recorded.  BLOOD ADMINISTERED:none  DRAINS: none   LOCAL MEDICATIONS USED:  MARCAINE    and OTHER X Morel  SPECIMEN:  No Specimen  DISPOSITION OF SPECIMEN:  N/A  COUNTS:  YES  TOURNIQUET:  * Missing tourniquet times found for documented tourniquets in log:  [id number] *  DICTATION: .Other Dictation: Dictation Number None provided  PLAN OF CARE: Admit to inpatient   PATIENT DISPOSITION:  PACU - hemodynamically stable.   Delay start of Pharmacological VTE agent (>24hrs) due to surgical blood loss or risk of bleeding: no

## 2016-07-18 LAB — CBC
HCT: 34.8 % — ABNORMAL LOW (ref 36.0–46.0)
Hemoglobin: 11 g/dL — ABNORMAL LOW (ref 12.0–15.0)
MCH: 27.9 pg (ref 26.0–34.0)
MCHC: 31.6 g/dL (ref 30.0–36.0)
MCV: 88.3 fL (ref 78.0–100.0)
Platelets: 281 10*3/uL (ref 150–400)
RBC: 3.94 MIL/uL (ref 3.87–5.11)
RDW: 13.7 % (ref 11.5–15.5)
WBC: 17.2 10*3/uL — ABNORMAL HIGH (ref 4.0–10.5)

## 2016-07-18 NOTE — Discharge Summary (Signed)
[name] MRN: [id number] DOB/AGE: 77/05/1941 [age] y.o.  Admit date: 07/16/2016 Discharge date: 07/18/2016  Admission Diagnoses:  Principal Problem:   Primary osteoarthritis of right knee   Discharge Diagnoses:  Same  Past Medical History:  Diagnosis Date  . Anemia    iron deficiency hx.has had iron infusions before   . Chronic low back pain   . Chronic pain disorder   . Complication of anesthesia    severe claustrophobia  . Constipation    r/t use of pain meds.Takes OTC meds or eats prunes  . GERD (gastroesophageal reflux disease)    takes Omeprazole daily  . Heart murmur    "slight"  . History of bronchitis    20+ yrs ago  . History of kidney stones   . History of prolapse of bladder   . History of shingles   . Hypertension    takes Losartan daily  . Hypothyroidism    takes Synthroid daily  . Joint swelling   . Neck pain    bone spurs at base of head per pt  . Osteoarthritis    lumbar,cervical,joints  . Pneumonia    hx of > 20 yrs ago  . Shortness of breath    occasionally and with exertion. Albuterol inhaler as needed  . Spinal headache 1991   blood patch placed  . Spondylitis (HCC)   . Unsteady gait    occasionally  . Urinary urgency     Surgeries: Procedure(s): RIGHT TOTAL KNEE ARTHROPLASTY on 07/16/2016   Discharged Condition: Improved  Hospital Course: Isabel Pertatricia W Yackel is an 75 y.o. female who was admitted 07/16/2016 for operative treatment ofPrimary osteoarthritis of right knee. Patient has severe unremitting pain that affects sleep, daily activities, and work/hobbies. After pre-op clearance the patient was taken to the operating room on 07/16/2016 and underwent  Procedure(s): RIGHT TOTAL KNEE ARTHROPLASTY.    Patient was given perioperative antibiotics:  Anti-infectives    Start     Dose/Rate Route Frequency Ordered Stop   07/16/16 1800  clindamycin (CLEOCIN) IVPB 600 mg     600 mg 100 mL/hr over 30 Minutes Intravenous Every 6  hours 07/16/16 1309 07/17/16 0030   07/16/16 0820  clindamycin (CLEOCIN) 900 MG/50ML IVPB    Comments:  Ray ChurchBowman, Jennifer   : cabinet override      07/16/16 0820 07/16/16 2029   07/16/16 0816  clindamycin (CLEOCIN) IVPB 900 mg     900 mg 100 mL/hr over 30 Minutes Intravenous On call to O.R. 07/16/16 0816 07/16/16 1125       Patient was given sequential compression devices, early ambulation, and chemoprophylaxis to prevent DVT.On postoperative day #2 the patient was doing well with physical therapy. She is taking by mouth and voiding okay. Her vital signs were stable and she was afebrile. She has some mild drainage on her right knee dressing and a new Aquacell dressing was placed. There was no sign of infection.  Patient benefited maximally from hospital stay and there were no complications.    Recent vital signs:  Patient Vitals for the past 24 hrs:  BP Temp Temp src Pulse Resp SpO2  07/18/16 1042 (!) 160/61 - - 62 - -  07/18/16 0530 (!) 136/57 98.3 F (36.8 C) Oral 66 - 99 %  07/17/16 2206 (!) 136/54 - - 83 - 98 %  07/17/16 2101 (!) 148/56 98.2 F (36.8 C) Oral 80 - 100 %  07/17/16 1432 (!) 127/45 98.1 F (36.7 C) Oral 81 -  98 %  07/17/16 1115 (!) 103/45 - - 77 16 96 %     Recent laboratory studies:   Recent Labs  07/17/16 0413 07/18/16 0317  WBC 8.8 17.2*  HGB 10.9* 11.0*  HCT 34.2* 34.8*  PLT 240 281  NA 138  --   K 4.1  --   CL 106  --   CO2 25  --   BUN 10  --   CREATININE 0.63  --   GLUCOSE 157*  --   CALCIUM 8.7*  --      Discharge Medications:     Medication List    TAKE these medications   aspirin EC 325 MG tablet Take 1 tablet (325 mg total) by mouth 2 (two) times daily after a meal. Take x 1 month post op to decrease risk of blood clots. What changed:  medication strength  how much to take  when to take this  additional instructions   Biotin 10 MG Caps Take 10 mg by mouth daily.   calcium-vitamin D 500-200 MG-UNIT tablet Commonly known  as:  OSCAL WITH D Take 1 tablet by mouth every morning.   cholecalciferol 1000 units tablet Commonly known as:  VITAMIN D Take 1,000 Units by mouth daily.   diclofenac sodium 1 % Gel Commonly known as:  VOLTAREN Apply 2 g topically 2 (two) times daily as needed (For pain.).   diphenhydrAMINE 25 MG tablet Commonly known as:  BENADRYL Take 25 mg by mouth every 6 (six) hours as needed for allergies.   ENZYME DIGEST PO Take 1 tablet by mouth daily.   EQ RESTORE TEARS 0.5 % Soln Generic drug:  carboxymethylcellulose Place 1 drop into both eyes daily.   estradiol 0.5 MG tablet Commonly known as:  ESTRACE Take 0.5 mg by mouth every other day.   fish oil-omega-3 fatty acids 1000 MG capsule Take 1,000 mg by mouth 2 (two) times daily.   HYDROcodone-acetaminophen 10-325 MG tablet Commonly known as:  NORCO Take 1-2 tablets by mouth every 6 (six) hours as needed for severe pain. What changed:  how much to take  when to take this   levothyroxine 50 MCG tablet Commonly known as:  SYNTHROID, LEVOTHROID Take 50 mcg by mouth every morning.   losartan 25 MG tablet Commonly known as:  COZAAR Take 25 mg by mouth daily.   magnesium 30 MG tablet Take 30 mg by mouth daily.   methocarbamol 750 MG tablet Commonly known as:  ROBAXIN Take 1 tablet (750 mg total) by mouth 4 (four) times daily as needed (use for muscle cramps/pain).   MULTIVITAMIN PO Take 1 tablet by mouth daily.   omeprazole 20 MG capsule Commonly known as:  PRILOSEC Take 20 mg by mouth daily.   POTASSIUM PO Take 1 tablet by mouth daily.   PROAIR HFA 108 (90 Base) MCG/ACT inhaler Generic drug:  albuterol Inhale 2 puffs into the lungs every 4 (four) hours as needed for shortness of breath.   PROBIOTIC DAILY PO Take 1 capsule by mouth daily.   prochlorperazine 10 MG tablet Commonly known as:  COMPAZINE Take 5 mg by mouth every 8 (eight) hours as needed (For nausea.).   Red Yeast Rice 600 MG Caps Take 1,200  mg by mouth daily after supper.   Turmeric 500 MG Caps Take 500 mg by mouth at bedtime.   vitamin B-12 250 MCG tablet Commonly known as:  CYANOCOBALAMIN Take 250 mcg by mouth at bedtime.  Diagnostic Studies: Dg Chest 2 View  Result Date: 07/02/2016 CLINICAL DATA:  Preoperative evaluation for upcoming knee arthroplasty EXAM: CHEST  2 VIEW COMPARISON:  05/02/2014 FINDINGS: The heart size and mediastinal contours are within normal limits. Both lungs are clear. The visualized skeletal structures show postsurgical changes in the cervical spine and right shoulder. IMPRESSION: No active cardiopulmonary disease. Electronically Signed   By: Alcide Clever M.D.   On: 07/02/2016 12:19    Disposition: 01-Home or Self Care  Discharge Instructions    CPM    Complete by:  As directed   Continuous passive motion machine (CPM):      Use the CPM from 0 to 70 degrees for 8 hours per day.      You may increase by 5-10 per day.  You may break it up into 2 or 3 sessions per day.      Use CPM for 1-2 weeks or until you are told to stop.   Call MD / Call 911    Complete by:  As directed   If you experience chest pain or shortness of breath, CALL 911 and be transported to the hospital emergency room.  If you develope a fever above 101 F, pus (white drainage) or increased drainage or redness at the wound, or calf pain, call your surgeon's office.   Constipation Prevention    Complete by:  As directed   Drink plenty of fluids.  Prune juice may be helpful.  You may use a stool softener, such as Colace (over the counter) 100 mg twice a day.  Use MiraLax (over the counter) for constipation as needed.   Diet general    Complete by:  As directed   Do not put a pillow under the knee. Place it under the heel.    Complete by:  As directed   Face-to-face encounter (required for Medicare/Medicaid patients)    Complete by:  As directed   I Lourdez Mcgahan G certify that this patient is under my care and that I, or a  nurse practitioner or physician's assistant working with me, had a face-to-face encounter that meets the physician face-to-face encounter requirements with this patient on 07/18/2016. The encounter with the patient was in whole, or in part for the following medical condition(s) which is the primary reason for home health care (List medical condition): s/p right total knee arthroplasty   The encounter with the patient was in whole, or in part, for the following medical condition, which is the primary reason for home health care:  s/p right tka   I certify that, based on my findings, the following services are medically necessary home health services:  Physical therapy   Reason for Medically Necessary Home Health Services:  Therapy- Therapeutic Exercises to Increase Strength and Endurance   My clinical findings support the need for the above services:  Unable to leave home safely without assistance and/or assistive device   Further, I certify that my clinical findings support that this patient is homebound due to:  Pain interferes with ambulation/mobility   Home Health    Complete by:  As directed   To provide the following care/treatments:  PT   wbat on right   2-3 x/week   Increase activity slowly as tolerated    Complete by:  As directed   Weight bearing as tolerated    Complete by:  As directed   Laterality:  right   Extremity:  Lower      Follow-up Information  GRAVES,JOHN L, MD. Schedule an appointment as soon as possible for a visit in 2 weeks.   Specialty:  Orthopedic Surgery Contact information: [address] [place] [address] [phone]            Signed: Matthew Folks 07/18/2016, 10:46 AM

## 2016-07-18 NOTE — Progress Notes (Signed)
Discharge instructions and prescription provided to patient.  IV removed.  Home health set up by case management.  Dressing changed to aquacel per PA request earlier today. Belongings with patient at time of discharge.    Patient requested a prescription for Robaxin, answering notified of request for medication to be called in to the [facility] on [address].

## 2016-07-18 NOTE — Progress Notes (Signed)
Subjective: 2 Days Post-Op Procedure(s) (LRB): RIGHT TOTAL KNEE ARTHROPLASTY (Right) Patient reports pain as moderate.  Well with physical therapy. Taking by mouth and voiding okay.  Objective: Vital signs in last 24 hours: Temp:  [98.1 F (36.7 C)-98.3 F (36.8 C)] 98.3 F (36.8 C) (08/20 0530) Pulse Rate:  [66-83] 66 (08/20 0530) Resp:  [16] 16 (08/19 1115) BP: (103-148)/(45-57) 136/57 (08/20 0530) SpO2:  [96 %-100 %] 99 % (08/20 0530)  Intake/Output from previous day: No intake/output data recorded. Intake/Output this shift: No intake/output data recorded.   Recent Labs  07/17/16 0413 07/18/16 0317  HGB 10.9* 11.0*    Recent Labs  07/17/16 0413 07/18/16 0317  WBC 8.8 17.2*  RBC 3.89 3.94  HCT 34.2* 34.8*  PLT 240 281    Recent Labs  07/17/16 0413  NA 138  K 4.1  CL 106  CO2 25  BUN 10  CREATININE 0.63  GLUCOSE 157*  CALCIUM 8.7*   No results for input(s): LABPT, INR in the last 72 hours. Right knee exam: Neurovascular intact Sensation intact distally Intact pulses distally Dorsiflexion/Plantar flexion intact Incision: scant drainage Compartment soft  Assessment/Plan: 2 Days Post-Op Procedure(s) (LRB): RIGHT TOTAL KNEE ARTHROPLASTY (Right) Plan: Dressing changed right knee. New Aquacel dressing applied. Aspirin 325 mg twice daily for DVT prophylaxis 1 month. Up with therapy Discharge home with home health Follow-up with Dr. Luiz BlareGraves in 2 weeks.  Eean Buss G 07/18/2016, 10:23 AM

## 2016-07-18 NOTE — Progress Notes (Signed)
Physical Therapy Treatment Patient Details Name: Isabel Barnes MRN: [id number] DOB: 04/21/1941 Today's Date: 07/18/2016    History of Present Illness 75 yo female, post Right TKA due to OA on 07/16/16.  Prior hx includes: R hip OA, multiple spinal surgeries, hx R shoulder replacement, chronic pain.    PT Comments    Patient progressing well towards PT goals. Reports having increased pain today and a rough night. Tolerated gait training emphasizing advancement and clearing of RLE. Mild foot drop noted resulting in dragging of toes at times when fatigued or not paying attention. Performed exercises. Will focus on bed mobility, gait and stair training this PM prior to discharge. Will follow.   Follow Up Recommendations  Home health PT;Supervision/Assistance - 24 hour     Equipment Recommendations  None recommended by PT    Recommendations for Other Services       Precautions / Restrictions Precautions Precautions: Knee Precaution Booklet Issued: No Precaution Comments: Reviewed knee precautions and no pillow under knee. Required Braces or Orthoses: Knee Immobilizer - Right Knee Immobilizer - Right: On when out of bed or walking Restrictions Weight Bearing Restrictions: Yes RLE Weight Bearing: Weight bearing as tolerated    Mobility  Bed Mobility               General bed mobility comments: Pt standing in room upon PT arrival.  Transfers Overall transfer level: Needs assistance Equipment used: Rolling walker (2 wheeled) Transfers: Sit to/from Stand Sit to Stand: Min guard         General transfer comment: Min guard for safety. Stood from [name] x1, transferred to chair.  Ambulation/Gait Ambulation/Gait assistance: Min guard Ambulation Distance (Feet): 100 Feet Assistive device: Rolling walker (2 wheeled) Gait Pattern/deviations: Decreased dorsiflexion - right;Step-to pattern;Step-through pattern;Decreased stride length;Trunk flexed Gait velocity: decreased    General Gait Details: Cues for knee extension during stance phase to activate quads and for knee flexion during swing. Decreased right foot clearance. Ambulated without KI.   Stairs            Wheelchair Mobility    Modified Rankin (Stroke Patients Only)       Balance Overall balance assessment: Needs assistance Sitting-balance support: Feet supported;No upper extremity supported Sitting balance-Leahy Scale: Good     Standing balance support: During functional activity Standing balance-Leahy Scale: Fair Standing balance comment: Able to stand and use sink, wash peri care without assist or UE support. No LOB.                    Cognition Arousal/Alertness: Awake/alert Behavior During Therapy: WFL for tasks assessed/performed Overall Cognitive Status: Within Functional Limits for tasks assessed                      Exercises Total Joint Exercises Quad Sets: Both;10 reps;Supine Heel Slides: Right;5 reps;Seated Long Arc Quad: Right;10 reps;Seated    General Comments General comments (skin integrity, edema, etc.): Assisted pt with fixing ted hose.       Pertinent Vitals/Pain Pain Assessment: 0-10 Pain Score: 8  Pain Location: right knee with mobility Pain Descriptors / Indicators: Sore;Aching Pain Intervention(s): Monitored during session;Repositioned;Limited activity within patient's tolerance    Home Living                      Prior Function            PT Goals (current goals can now be found in the care plan section)  Progress towards PT goals: Progressing toward goals    Frequency  7X/week    PT Plan Current plan remains appropriate    Co-evaluation             End of Session Equipment Utilized During Treatment: Gait belt Activity Tolerance: Patient tolerated treatment well Patient left: in chair;with call bell/phone within reach     Time: 0709-0736 PT Time Calculation (min) (ACUTE ONLY): 27 min  Charges:   $Gait Training: 8-22 mins $Therapeutic Activity: 8-22 mins                    G Codes:      Myndi Wamble A Jenia Klepper 07/18/2016, 8:11 AM  Mylo RedShauna Donneisha Beane, PT, DPT [phone]

## 2016-07-18 NOTE — Progress Notes (Signed)
Physical Therapy Treatment Patient Details Name: Isabel Barnes MRN: [id number] DOB: 10/25/1941 Today's Date: 07/18/2016    History of Present Illness 75 yo female, post Right TKA due to OA on 07/16/16.  Prior hx includes: R hip OA, multiple spinal surgeries, hx R shoulder replacement, chronic pain.    PT Comments    Patient progressing well this PM but continues to have pain, worse than yesterday. Despite pain, pt eager to participate in therapy. Tolerated bed mobility, gait training and stairs with Min guard-supervision for safety. Reviewed exercise handout. AROM limited today due to pain and stiffness. Still has decreased DF right foot. Discussed how to safely enter home. Pt plans to discharge home with support of daughter this afternoon. Will continue to follow if still in hospital.   Follow Up Recommendations  Home health PT;Supervision/Assistance - 24 hour     Equipment Recommendations  None recommended by PT    Recommendations for Other Services       Precautions / Restrictions Precautions Precautions: Knee Precaution Booklet Issued: No Precaution Comments: Reviewed knee precautions and no pillow under knee. Required Braces or Orthoses: Knee Immobilizer - Right Knee Immobilizer - Right: On when out of bed or walking Restrictions Weight Bearing Restrictions: Yes RLE Weight Bearing: Weight bearing as tolerated    Mobility  Bed Mobility Overal bed mobility: Needs Assistance Bed Mobility: Supine to Sit;Sit to Supine     Supine to sit: Modified independent (Device/Increase time) Sit to supine: Modified independent (Device/Increase time)   General bed mobility comments: HOB flat, no use of rails to simulate home. Pt has left lifter at home as needed. Able to lift RLE into bed without assist.   Transfers Overall transfer level: Needs assistance Equipment used: Rolling walker (2 wheeled) Transfers: Sit to/from Stand Sit to Stand: Supervision         General  transfer comment: Supervision for safety.   Ambulation/Gait Ambulation/Gait assistance: Supervision Ambulation Distance (Feet): 120 Feet Assistive device: Rolling walker (2 wheeled) Gait Pattern/deviations: Decreased dorsiflexion - right;Step-through pattern;Decreased stride length;Trunk flexed Gait velocity: decreased Gait velocity interpretation: Below normal speed for age/gender General Gait Details: Cues for knee extension during stance phase to activate quads and for knee flexion during swing. Decreased right foot clearance. Ambulated without KI. Cues for forward gaze.    Stairs Stairs: Yes Stairs assistance: Min assist Stair Management: Backwards;With walker Number of Stairs: 2 General stair comments: Cues for technique and to stabilize RW.   Wheelchair Mobility    Modified Rankin (Stroke Patients Only)       Balance Overall balance assessment: Needs assistance Sitting-balance support: Feet supported;No upper extremity supported Sitting balance-Leahy Scale: Good     Standing balance support: During functional activity Standing balance-Leahy Scale: Fair                      Cognition Arousal/Alertness: Awake/alert Behavior During Therapy: WFL for tasks assessed/performed Overall Cognitive Status: Within Functional Limits for tasks assessed                      Exercises Total Joint Exercises Ankle Circles/Pumps: Both;10 reps;Seated Quad Sets: Both;10 reps;Seated Heel Slides: Right;10 reps;Seated Hip ABduction/ADduction: Right;5 reps;Seated Long Arc Quad: Right;5 reps;Seated Goniometric ROM: 5-75 degrees knee AROM    General Comments        Pertinent Vitals/Pain Pain Assessment: Faces Faces Pain Scale: Hurts even more Pain Location: right knee Pain Descriptors / Indicators: Sore;Aching Pain Intervention(s): Monitored during session;Repositioned  Home Living                      Prior Function            PT Goals  (current goals can now be found in the care plan section) Progress towards PT goals: Progressing toward goals    Frequency  7X/week    PT Plan Current plan remains appropriate    Co-evaluation             End of Session Equipment Utilized During Treatment: Gait belt Activity Tolerance: Patient tolerated treatment well;Patient limited by pain Patient left: in chair;with call bell/phone within reach     Time: 1306-1330 PT Time Calculation (min) (ACUTE ONLY): 24 min  Charges:  $Gait Training: 8-22 mins $Therapeutic Activity: 8-22 mins                    G Codes:      Isabel Barnes 07/18/2016, 1:33 PM Isabel Barnes, PT, DPT [phone]

## 2016-07-19 ENCOUNTER — Encounter (HOSPITAL_COMMUNITY): Payer: Self-pay | Admitting: Orthopedic Surgery

## 2017-06-25 ENCOUNTER — Emergency Department (HOSPITAL_COMMUNITY): Payer: Medicare Other

## 2017-06-25 ENCOUNTER — Emergency Department (HOSPITAL_COMMUNITY)
Admission: EM | Admit: 2017-06-25 | Discharge: 2017-06-25 | Disposition: A | Discharge status: Home / Self Care | Payer: Medicare Other | Attending: Emergency Medicine | Admitting: Emergency Medicine

## 2017-06-25 DIAGNOSIS — Z87891 Personal history of nicotine dependence: Secondary | ICD-10-CM | POA: Insufficient documentation

## 2017-06-25 DIAGNOSIS — R109 Unspecified abdominal pain: Secondary | ICD-10-CM | POA: Insufficient documentation

## 2017-06-25 DIAGNOSIS — Z79899 Other long term (current) drug therapy: Secondary | ICD-10-CM | POA: Diagnosis not present

## 2017-06-25 DIAGNOSIS — R740 Nonspecific elevation of levels of transaminase and lactic acid dehydrogenase [LDH]: Secondary | ICD-10-CM | POA: Insufficient documentation

## 2017-06-25 DIAGNOSIS — I10 Essential (primary) hypertension: Secondary | ICD-10-CM | POA: Diagnosis not present

## 2017-06-25 DIAGNOSIS — A09 Infectious gastroenteritis and colitis, unspecified: Secondary | ICD-10-CM

## 2017-06-25 DIAGNOSIS — R110 Nausea: Secondary | ICD-10-CM | POA: Diagnosis not present

## 2017-06-25 DIAGNOSIS — Z7982 Long term (current) use of aspirin: Secondary | ICD-10-CM | POA: Insufficient documentation

## 2017-06-25 DIAGNOSIS — R197 Diarrhea, unspecified: Secondary | ICD-10-CM | POA: Insufficient documentation

## 2017-06-25 DIAGNOSIS — R0989 Other specified symptoms and signs involving the circulatory and respiratory systems: Secondary | ICD-10-CM | POA: Insufficient documentation

## 2017-06-25 LAB — CBC WITH DIFFERENTIAL/PLATELET
Basophils Absolute: 0 10*3/uL (ref 0.0–0.1)
Basophils Relative: 0 %
EOS PCT: 1 %
Eosinophils Absolute: 0 10*3/uL (ref 0.0–0.7)
HCT: 35.1 % — ABNORMAL LOW (ref 36.0–46.0)
Hemoglobin: 11.7 g/dL — ABNORMAL LOW (ref 12.0–15.0)
LYMPHS ABS: 0.8 10*3/uL (ref 0.7–4.0)
LYMPHS PCT: 9 %
MCH: 27.9 pg (ref 26.0–34.0)
MCHC: 33.3 g/dL (ref 30.0–36.0)
MCV: 83.6 fL (ref 78.0–100.0)
MONO ABS: 0.4 10*3/uL (ref 0.1–1.0)
MONOS PCT: 4 %
Neutro Abs: 7.3 10*3/uL (ref 1.7–7.7)
Neutrophils Relative %: 86 %
PLATELETS: 303 10*3/uL (ref 150–400)
RBC: 4.2 MIL/uL (ref 3.87–5.11)
RDW: 13.4 % (ref 11.5–15.5)
WBC: 8.5 10*3/uL (ref 4.0–10.5)

## 2017-06-25 LAB — COMPREHENSIVE METABOLIC PANEL
ALT: 96 U/L — AB (ref 14–54)
AST: 97 U/L — AB (ref 15–41)
Albumin: 3.7 g/dL (ref 3.5–5.0)
Alkaline Phosphatase: 139 U/L — ABNORMAL HIGH (ref 38–126)
Anion gap: 10 (ref 5–15)
BILIRUBIN TOTAL: 0.6 mg/dL (ref 0.3–1.2)
BUN: 11 mg/dL (ref 6–20)
CHLORIDE: 107 mmol/L (ref 101–111)
CO2: 22 mmol/L (ref 22–32)
Calcium: 8.6 mg/dL — ABNORMAL LOW (ref 8.9–10.3)
Creatinine, Ser: 0.61 mg/dL (ref 0.44–1.00)
GFR calc Af Amer: >60 mL/min (ref >60)
GFR calc non Af Amer: >60 mL/min (ref >60)
GLUCOSE: 116 mg/dL — AB (ref 65–99)
POTASSIUM: 3.8 mmol/L (ref 3.5–5.1)
Sodium: 139 mmol/L (ref 135–145)
Total Protein: 6.3 g/dL — ABNORMAL LOW (ref 6.5–8.1)

## 2017-06-25 LAB — I-STAT CG4 LACTIC ACID, ED: Lactic Acid, Venous: 1.87 mmol/L (ref 0.5–1.9)

## 2017-06-25 MED ORDER — PROMETHAZINE HCL 25 MG PO TABS: 25.0000 mg | ORAL_TABLET | Freq: Four times a day (QID) | ORAL | 0 refills | Status: DC | PRN

## 2017-06-25 MED ORDER — ONDANSETRON HCL 8 MG PO TABS: 8.0000 mg | ORAL_TABLET | Freq: Three times a day (TID) | ORAL | 0 refills | Status: DC | PRN

## 2017-06-25 MED ORDER — ONDANSETRON HCL 4 MG/2ML IJ SOLN
4.0000 mg | Freq: Once | INTRAMUSCULAR | Status: AC
  Administered 2017-06-25: 4 mg via INTRAVENOUS
  Filled 2017-06-25: qty 2

## 2017-06-25 NOTE — ED Triage Notes (Signed)
Patient from home with diarrhea and nausea for four days.  Patient not eating or drinking very well.  EMS started an IV and gave her of fluid.  Denies pain.

## 2017-06-25 NOTE — ED Notes (Signed)
Patient reports stool was not bloody or dark.

## 2017-06-25 NOTE — ED Notes (Signed)
Bed: [id number] Expected date: 06/25/17 Expected time: 1:46 PM Means of arrival: Ambulance Comments: N/V/D x 3 days

## 2017-06-25 NOTE — ED Notes (Signed)
Patient holding down fluids.

## 2017-06-25 NOTE — Discharge Instructions (Signed)
Gradually advance your diet, beginning with clear liquids. Try to drink extra fluids, to prevent and treat dehydration.  See your primary care doctor for a checkup as needed, and in one or 2 weeks to repeat the liver function testing. You had a mild elevation of your liver function tests, today.  We are giving you prescriptions for both Phenergan and Zofran, to treat the nausea. It is better to use Zofran if possible because Phenergan makes people very sleepy.

## 2017-06-25 NOTE — ED Provider Notes (Signed)
[facility]-EMERGENCY DEPT Provider Note   CSN: [id number] Arrival date & time: 06/25/17  1342     History   Chief Complaint Chief Complaint  Patient presents with  . Diarrhea    HPI Clayburn Pertatricia W Murtagh is a 76 y.o. female.  She presents for evaluation of intermittent diarrhea for several days, decreased oral intake, nausea and feeling dehydrated. No known sick contacts or abnormal food ingestions. No recent use of antibiotics. She has crampy abdominal pain but no persistent abdominal discomfort. She has also had upper chest congestion, without cough or shortness of breath. She was able to tolerate one can of ensure, yesterday. She came here today, because she had a large loose bowel movement, that was yellow in color. She denies blood in stool, fever, chills, dizziness, or paresthesia.  No similar problem, in the past. There are no other known modifying factors.  HPI  Past Medical History:  Diagnosis Date  . Anemia    iron deficiency hx.has had iron infusions before   . Chronic low back pain   . Chronic pain disorder   . Complication of anesthesia    severe claustrophobia  . Constipation    r/t use of pain meds.Takes OTC meds or eats prunes  . GERD (gastroesophageal reflux disease)    takes Omeprazole daily  . Heart murmur    "slight"  . History of bronchitis    20+ yrs ago  . History of kidney stones   . History of prolapse of bladder   . History of shingles   . Hypertension    takes Losartan daily  . Hypothyroidism    takes Synthroid daily  . Joint swelling   . Neck pain    bone spurs at base of head per pt  . Osteoarthritis    lumbar,cervical,joints  . Pneumonia    hx of > 20 yrs ago  . Shortness of breath    occasionally and with exertion. Albuterol inhaler as needed  . Spinal headache 1991   blood patch placed  . Spondylitis (HCC)   . Unsteady gait    occasionally  . Urinary urgency     Patient Active Problem List   Diagnosis Date Noted  . Primary  osteoarthritis of right knee 07/16/2016  . 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 Chronic pain disorder   . Osteoarthritis   . Incisional umbilical hernia, without obstruction or gangrene   . Iron deficiency anemia 09/19/2012    Past Surgical History:  Procedure Laterality Date  . ABDOMINAL HYSTERECTOMY    . ANTERIOR FUSION CERVICAL SPINE     x2 -C4-7  . BUNIONECTOMY Bilateral   . COLONOSCOPY    . CYSTOSCOPY W/ URETEROSCOPY  2012  . EYE SURGERY Bilateral    cataract /lens implant  . HOLMIUM LASER APPLICATION Left 02/08/2013   Procedure: HOLMIUM LASER APPLICATION;  Surgeon: Anner CreteJohn J Wrenn, MD;  [facility] ;  Service: Urology;  Laterality: Left;  . INSERTION OF MESH N/A 07/15/2014   Procedure: INSERTION OF MESH;  Surgeon: Ardeth SportsmanSteven C. Gross, MD;  Location: [facility] OR;  Service: General;  Laterality: N/A;  . JOINT REPLACEMENT Right 2012   shoulder  . LAPAROSCOPIC CHOLECYSTECTOMY W/ CHOLANGIOGRAPHY  2012   Dr Magnus IvanBlackman  . NASAL SINUS SURGERY    . RADIOLOGY WITH ANESTHESIA N/A 05/09/2014   Procedure: ADULT SEDATION WITH ANESTHESIA/MRI CERVICAL SPINE WITHOUT CONTRAST;  Surgeon: Medication Radiologist, MD;  Location: [facility] OR;  Service: Radiology;  Laterality: N/A;  DR. HAWKS/MRI  . right knee  arthroscopy     d/t meniscal tear  . SHOULDER ARTHROSCOPY W/ ROTATOR CUFF REPAIR Bilateral three times each over several yrs  . THUMB ARTHROSCOPY Left   . 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 UMBILICAL HERNIA REPAIR N/A 07/15/2014   Procedure: LAPAROSCOPIC UMBILICAL AND INFRAUMBILICAL HERNIA;  Surgeon: Ardeth Sportsman, MD;  Location: [facility] OR;  Service: General;  Laterality: N/A;    OB History    No data available       Home Medications    Prior to Admission medications   Medication Sig Start Date End Date Taking? Authorizing Provider  albuterol (PROAIR HFA)  108 (90 BASE) MCG/ACT inhaler Inhale 2 puffs into the lungs every 4 (four) hours as needed for shortness of breath.    Yes [provider]  aspirin EC 325 MG tablet Take 1 tablet (325 mg total) by mouth 2 (two) times daily after a meal. Take x 1 month post op to decrease risk of blood clots. Patient taking differently: Take 81 mg by mouth 2 (two) times daily after a meal. Take x 1 month post op to decrease risk of blood clots. 07/16/16  Yes Marshia Ly, PA-C  calcium-vitamin D (OSCAL WITH D) 500-200 MG-UNIT per tablet Take 1 tablet by mouth every morning.    Yes [provider]  carboxymethylcellulose (EQ RESTORE TEARS) 0.5 % SOLN Place 1 drop into both eyes daily.   Yes [provider]  cholecalciferol (VITAMIN D) 1000 UNITS tablet Take 1,000 Units by mouth daily.    Yes [provider]  diclofenac sodium (VOLTAREN) 1 % GEL Apply 2 g topically 2 (two) times daily as needed (For pain.).    Yes [provider]  Digestive Enzymes (ENZYME DIGEST PO) Take 1 tablet by mouth daily.    Yes [provider]  ENSURE PLUS (ENSURE PLUS) LIQD Take 237 mLs by mouth.   Yes [provider]  fish oil-omega-3 fatty acids 1000 MG capsule Take 1,000 mg by mouth 2 (two) times daily.    Yes [provider]  Guaifenesin (MUCINEX MAXIMUM STRENGTH) 1200 MG TB12 Take 1,200 mg by mouth 2 (two) times daily.   Yes [provider]  HYDROcodone-acetaminophen (NORCO) 10-325 MG tablet Take 1-2 tablets by mouth every 6 (six) hours as needed for severe pain. 07/16/16  Yes Marshia Ly, PA-C  levothyroxine (SYNTHROID, LEVOTHROID) 50 MCG tablet Take 50 mcg by mouth every morning.    Yes [provider]  loperamide (IMODIUM A-D) 2 MG tablet Take 2-4 mg by mouth 4 (four) times daily as needed for diarrhea or loose stools.   Yes [provider]  losartan (COZAAR) 25 MG tablet Take 25 mg by mouth daily.    Yes [provider]    magnesium 30 MG tablet Take 30 mg by mouth daily.    Yes [provider]  Multiple Vitamins-Minerals (MULTIVITAMIN PO) Take 1 tablet by mouth daily.   Yes [provider]  omeprazole (PRILOSEC) 20 MG capsule Take 20 mg by mouth daily.    Yes [provider]  POTASSIUM PO Take 1 tablet by mouth daily.   Yes [provider]  Probiotic Product (PROBIOTIC DAILY PO) Take 1 capsule by mouth daily.    Yes [provider]  prochlorperazine (COMPAZINE) 10 MG tablet Take 5 mg by mouth every 8 (eight) hours as  needed (For nausea.).    Yes [provider]  Red Yeast Rice 600 MG CAPS Take 1,200 mg by mouth daily after supper.    Yes [provider]  Turmeric 500 MG CAPS Take 500 mg by mouth at bedtime.    Yes [provider]  vitamin B-12 (CYANOCOBALAMIN) 250 MCG tablet Take 250 mcg by mouth at bedtime.    Yes [provider]  diphenhydrAMINE (BENADRYL) 25 MG tablet Take 25 mg by mouth every 6 (six) hours as needed for allergies.     [provider]  estradiol (ESTRACE) 0.5 MG tablet Take 0.5 mg by mouth every other day.     [provider]    Family History Family History  Problem Relation Age of Onset  . Stroke Father     Social History Social History  Substance Use Topics  . Smoking status: Former Smoker    Packs/day: 0.50    Years: 2.00    Types: Cigarettes    Quit date: 02/07/1992  . Smokeless tobacco: Never Used  . Alcohol use No     Allergies   Dilaudid [hydromorphone hcl]; Gabapentin; Lyrica [pregabalin]; Oxycodone; Singulair [montelukast sodium]; Banana; Ciprofloxacin hcl; Codeine; Methadone; Metronidazole; Oysters [shellfish allergy]; Clindamycin/lincomycin; Sulfa antibiotics; Betadine [povidone iodine]; Latex; Nucynta [tapentadol]; Other; Penicillins; and Sulfur   Review of Systems Review of Systems  All other systems reviewed and are negative.    Physical Exam Updated Vital  Signs BP (!) 172/81 (BP Location: Right Arm)   Pulse 95   Temp 98.5 F (36.9 C) (Oral)   Resp 18   SpO2 98%   Physical Exam  Constitutional: She is oriented to person, place, and time. She appears well-developed. No distress.  Elderly, frail  HENT:  Head: Normocephalic and atraumatic.  Eyes: Pupils are equal, round, and reactive to light. Conjunctivae and EOM are normal.  Neck: Normal range of motion and phonation normal. Neck supple.  Cardiovascular: Normal rate and regular rhythm.   Pulmonary/Chest: Effort normal and breath sounds normal. No respiratory distress. She exhibits no tenderness.  Abdominal: Soft. She exhibits no distension and no mass. There is no tenderness. There is no guarding.  Musculoskeletal: Normal range of motion.  Neurological: She is alert and oriented to person, place, and time. She exhibits normal muscle tone.  Skin: Skin is warm and dry.  Psychiatric: She has a normal mood and affect. Her behavior is normal. Judgment and thought content normal.  Nursing note and vitals reviewed.    ED Treatments / Results  Labs (all labs ordered are listed, but only abnormal results are displayed) Labs Reviewed  COMPREHENSIVE METABOLIC PANEL - Abnormal; Notable for the following:       Result Value   Glucose, Bld 116 (*)    Calcium 8.6 (*)    Total Protein 6.3 (*)    AST 97 (*)    ALT 96 (*)    Alkaline Phosphatase 139 (*)    All other components within normal limits  CBC WITH DIFFERENTIAL/PLATELET - Abnormal; Notable for the following:    Hemoglobin 11.7 (*)    HCT 35.1 (*)    All other components within normal limits  I-STAT CG4 LACTIC ACID, ED    EKG  EKG Interpretation None       Radiology Dg Chest 2 View  Result Date: 06/25/2017 CLINICAL DATA:  Cough, shortness of breath. EXAM: CHEST  2 VIEW COMPARISON:  Radiographs of July 02, 2016. FINDINGS: The heart size and mediastinal contours  are within normal limits. Both lungs are clear. No pneumothorax  or pleural effusion is noted. Status post right shoulder arthroplasty. IMPRESSION: No active cardiopulmonary disease. Electronically Signed   By: Lupita RaiderJames  Green Jr, M.D.   On: 06/25/2017 14:54    Procedures Procedures (including critical care time)  Medications Ordered in ED Medications  ondansetron (ZOFRAN) injection 4 mg (4 mg Intravenous Given 06/25/17 1542)     Initial Impression / Assessment and Plan / ED Course  I have reviewed the triage vital signs and the nursing notes.  Pertinent labs & imaging results that were available during my care of the patient were reviewed by me and considered in my medical decision making (see chart for details).      Patient Vitals for the past 24 hrs:  BP Temp Temp src Pulse Resp SpO2  06/25/17 1642 137/62 98.7 F (37.1 C) Oral 83 16 96 %  06/25/17 1358 (!) 172/81 98.5 F (36.9 C) Oral 95 18 98 %    4:57 PM Reevaluation with update and discussion. After initial assessment and treatment, an updated evaluation reveals She feels better now with decreased nausea, and has been able to tolerate oral liquids, without pain or discomfort. No diarrhea. In the emergency department. Findings discussed with patient, and daughter, all questions answered. Meilah Delrosario L    Final Clinical Impressions(s) / ED Diagnoses   Final diagnoses:  Diarrhea of infectious origin   Evaluation is consistent with viral illness, with mild transaminitis. Doubt pancreatitis, gallbladder disease, serious bacterial infection or metabolic instability. No evidence for severe dehydration.  Nursing Notes Reviewed/ Care Coordinated Applicable Imaging Reviewed Interpretation of Laboratory Data incorporated into ED treatment  The patient appears reasonably screened and/or stabilized for discharge and I doubt any other medical condition or other [facility] requiring further screening, evaluation, or treatment in the ED at this time prior to discharge.  Plan: Home Medications- continue  usual medications; Home Treatments- gradually advance diet; return here if the recommended treatment, does not improve the symptoms; Recommended follow up- PCP checkup as needed and in one or 2 weeks for repeat liver function study testing   New Prescriptions New Prescriptions   No medications on file     Mancel BaleWentz, Dariela Stoker, MD 06/25/17 1700

## 2018-06-19 ENCOUNTER — Other Ambulatory Visit: Payer: Self-pay | Admitting: Orthopedic Surgery

## 2018-06-19 DIAGNOSIS — M5442 Lumbago with sciatica, left side: Secondary | ICD-10-CM

## 2018-06-19 DIAGNOSIS — M545 Low back pain: Secondary | ICD-10-CM

## 2018-06-21 ENCOUNTER — Ambulatory Visit
Admission: RE | Admit: 2018-06-21 | Discharge: 2018-06-21 | Disposition: A | Discharge status: Home / Self Care | Payer: Medicare Other | Source: Ambulatory Visit | Attending: Orthopedic Surgery | Admitting: Orthopedic Surgery

## 2018-06-21 DIAGNOSIS — M5442 Lumbago with sciatica, left side: Secondary | ICD-10-CM

## 2018-06-21 DIAGNOSIS — M545 Low back pain: Secondary | ICD-10-CM

## 2018-09-07 ENCOUNTER — Other Ambulatory Visit: Payer: Self-pay | Admitting: Orthopedic Surgery

## 2018-09-07 DIAGNOSIS — M7061 Trochanteric bursitis, right hip: Secondary | ICD-10-CM

## 2018-09-08 ENCOUNTER — Ambulatory Visit
Admission: RE | Admit: 2018-09-08 | Discharge: 2018-09-08 | Disposition: A | Discharge status: Home / Self Care | Payer: Medicare Other | Source: Ambulatory Visit | Attending: Orthopedic Surgery | Admitting: Orthopedic Surgery

## 2018-09-08 DIAGNOSIS — M7061 Trochanteric bursitis, right hip: Secondary | ICD-10-CM

## 2019-04-12 ENCOUNTER — Encounter (HOSPITAL_BASED_OUTPATIENT_CLINIC_OR_DEPARTMENT_OTHER): Payer: Medicare Other | Attending: Internal Medicine

## 2019-04-12 DIAGNOSIS — I349 Nonrheumatic mitral valve disorder, unspecified: Secondary | ICD-10-CM | POA: Insufficient documentation

## 2019-04-12 DIAGNOSIS — Z96651 Presence of right artificial knee joint: Secondary | ICD-10-CM | POA: Diagnosis not present

## 2019-04-12 DIAGNOSIS — I10 Essential (primary) hypertension: Secondary | ICD-10-CM | POA: Diagnosis not present

## 2019-04-12 DIAGNOSIS — L97812 Non-pressure chronic ulcer of other part of right lower leg with fat layer exposed: Secondary | ICD-10-CM | POA: Insufficient documentation

## 2019-04-12 DIAGNOSIS — I87321 Chronic venous hypertension (idiopathic) with inflammation of right lower extremity: Secondary | ICD-10-CM | POA: Insufficient documentation

## 2019-04-16 ENCOUNTER — Other Ambulatory Visit (HOSPITAL_COMMUNITY): Payer: Self-pay | Admitting: Physician Assistant

## 2019-04-16 ENCOUNTER — Other Ambulatory Visit: Payer: Self-pay | Admitting: Physician Assistant

## 2019-04-16 DIAGNOSIS — R600 Localized edema: Secondary | ICD-10-CM

## 2019-04-16 DIAGNOSIS — M7989 Other specified soft tissue disorders: Secondary | ICD-10-CM

## 2019-04-18 ENCOUNTER — Telehealth: Payer: Self-pay | Admitting: *Deleted

## 2019-04-18 NOTE — Telephone Encounter (Signed)
REFERRAL SENT TO SCHEDULING AND NOTES ON FILE FROM [facility] [phone] [place], [place]

## 2019-04-19 DIAGNOSIS — L97812 Non-pressure chronic ulcer of other part of right lower leg with fat layer exposed: Secondary | ICD-10-CM | POA: Diagnosis not present

## 2019-04-26 DIAGNOSIS — L97812 Non-pressure chronic ulcer of other part of right lower leg with fat layer exposed: Secondary | ICD-10-CM | POA: Diagnosis not present

## 2019-04-27 ENCOUNTER — Telehealth: Payer: Self-pay | Admitting: Cardiovascular Disease

## 2019-04-27 NOTE — Telephone Encounter (Signed)
Follow up  ° ° °Pt is returning call  ° ° °Please call back  °

## 2019-04-30 ENCOUNTER — Telehealth: Payer: Self-pay | Admitting: Cardiovascular Disease

## 2019-04-30 ENCOUNTER — Telehealth (HOSPITAL_COMMUNITY): Payer: Self-pay

## 2019-04-30 ENCOUNTER — Other Ambulatory Visit (HOSPITAL_COMMUNITY): Payer: Medicare Other

## 2019-04-30 NOTE — Telephone Encounter (Signed)
call [phone]/ consent/ my chart via emailed/ pre reg completed

## 2019-04-30 NOTE — Telephone Encounter (Signed)
error 

## 2019-04-30 NOTE — Telephone Encounter (Signed)
Called x3 for pre reg °

## 2019-04-30 NOTE — Telephone Encounter (Signed)
LMTCB COVID prescreening for echo. 

## 2019-05-01 ENCOUNTER — Encounter: Payer: Self-pay | Admitting: Cardiovascular Disease

## 2019-05-01 ENCOUNTER — Other Ambulatory Visit: Payer: Self-pay

## 2019-05-01 ENCOUNTER — Telehealth (INDEPENDENT_AMBULATORY_CARE_PROVIDER_SITE_OTHER): Payer: Medicare Other | Admitting: Cardiovascular Disease

## 2019-05-01 ENCOUNTER — Ambulatory Visit (HOSPITAL_COMMUNITY): Payer: Medicare Other | Attending: Cardiovascular Disease

## 2019-05-01 VITALS — BP 159/68 | HR 83 | Ht 62.0 in | Wt 150.0 lb

## 2019-05-01 DIAGNOSIS — R011 Cardiac murmur, unspecified: Secondary | ICD-10-CM

## 2019-05-01 DIAGNOSIS — I872 Venous insufficiency (chronic) (peripheral): Secondary | ICD-10-CM

## 2019-05-01 DIAGNOSIS — R600 Localized edema: Secondary | ICD-10-CM | POA: Diagnosis present

## 2019-05-01 LAB — ECHOCARDIOGRAM COMPLETE
Height: 62 in
Weight: 2400 oz

## 2019-05-01 NOTE — Patient Instructions (Signed)
Medication Instructions:  No change in medications If you need a refill on your cardiac medications before your next appointment, please call your pharmacy.   Lab work: None If you have labs (blood work) drawn today and your tests are completely normal, you will receive your results only by: Marland Kitchen MyChart Message (if you have MyChart) OR . A paper copy in the mail If you have any lab test that is abnormal or we need to change your treatment, we will call you to review the results.  Testing/Procedures: The patient is scheduled for echocardiogram today.  Follow-Up: Office follow-up visit with me in 3 weeks

## 2019-05-01 NOTE — Progress Notes (Signed)
Virtual Visit via Telephone Note   This visit type was conducted due to national recommendations for restrictions regarding the COVID-19 Pandemic (e.g. social distancing) in an effort to limit this patient's exposure and mitigate transmission in our community.  Due to her co-morbid illnesses, this patient is at least at moderate risk for complications without adequate follow up.  This format is felt to be most appropriate for this patient at this time.  The patient did not have access to video technology/had technical difficulties with video requiring transitioning to audio format only (telephone).  All issues noted in this document were discussed and addressed.  No physical exam could be performed with this format.  Please refer to the patient's chart for her  consent to telehealth for [facility].   Date:  05/01/2019   ID:  Isabel Barnes, DOB May 17, 1941, MRN [id number]  Patient Location: Home Provider Location: Office  PCP:  Isabel Collin, PA-C  Cardiologist:  No primary care provider on file.  Electrophysiologist:  None   Evaluation Performed:  New Patient Evaluation  Chief Complaint: Leg edema and a heart murmur  History of Present Illness:    Isabel Barnes is a 78 y.o. female who was reached via phone for consultation regarding leg edema and a heart murmur.  The patient reports prolonged history of cardiac murmur but was told that it was louder during most recent evaluation.  No recent cardiac testing.  She has known history of hypertension controlled with losartan.  In addition, she has extensive arthritis, spondylosis and chronic pain.  She is a previous smoker and has no history of diabetes.  Over the last few months, she has experienced significant bilateral leg edema much worse on the right side.  This has been recently associated with redness.  She also has a wound above the right lateral ankle and she has been going to the wound clinic with Dr. Leanord Hawking.  The swelling  in her legs is much better in the morning after she wakes up.   She underwent lower extremity venous Doppler which showed no evidence of DVT but she was told about superficial thrombophlebitis.  She also reports having a screening ABI with Dr. Leanord Hawking and was told that the circulation was close to normal.  She reports mild exertional dyspnea without chest discomfort.  The patient does not have symptoms concerning for COVID-19 infection (fever, chills, cough, or new shortness of breath).    Past Medical History:  Diagnosis Date  . Anemia    iron deficiency hx.has had iron infusions before   . Chronic low back pain   . Chronic pain disorder   . Complication of anesthesia    severe claustrophobia  . Constipation    r/t use of pain meds.Takes OTC meds or eats prunes  . GERD (gastroesophageal reflux disease)    takes Omeprazole daily  . Heart murmur    "slight"  . History of bronchitis    20+ yrs ago  . History of kidney stones   . History of prolapse of bladder   . History of shingles   . Hypertension    takes Losartan daily  . Hypothyroidism    takes Synthroid daily  . Joint swelling   . Neck pain    bone spurs at base of head per pt  . Osteoarthritis    lumbar,cervical,joints  . Pneumonia    hx of > 20 yrs ago  . Shortness of breath    occasionally and with exertion.  Albuterol inhaler as needed  . Spinal headache 1991   blood patch placed  . Spondylitis (HCC)   . Unsteady gait    occasionally  . Urinary urgency    Past Surgical History:  Procedure Laterality Date  . ABDOMINAL HYSTERECTOMY    . ANTERIOR FUSION CERVICAL SPINE     x2 -C4-7  . BUNIONECTOMY Bilateral   . COLONOSCOPY    . CYSTOSCOPY W/ URETEROSCOPY  2012  . EYE SURGERY Bilateral    cataract /lens implant  . HOLMIUM LASER APPLICATION Left 02/08/2013   Procedure: HOLMIUM LASER APPLICATION;  Surgeon: Anner Crete, MD;  Location: [facility];  Service: Urology;  Laterality: Left;  . INSERTION  OF MESH N/A 07/15/2014   Procedure: INSERTION OF MESH;  Surgeon: Ardeth Sportsman, MD;  Location: [facility] OR;  Service: General;  Laterality: N/A;  . JOINT REPLACEMENT Right 2012   shoulder  . LAPAROSCOPIC CHOLECYSTECTOMY W/ CHOLANGIOGRAPHY  2012   Dr Magnus Ivan  . NASAL SINUS SURGERY    . RADIOLOGY WITH ANESTHESIA N/A 05/09/2014   Procedure: ADULT SEDATION WITH ANESTHESIA/MRI CERVICAL SPINE WITHOUT CONTRAST;  Surgeon: Medication Radiologist, MD;  Location: [facility] OR;  Service: Radiology;  Laterality: N/A;  DR. HAWKS/MRI  . right knee arthroscopy     d/t meniscal tear  . SHOULDER ARTHROSCOPY W/ ROTATOR CUFF REPAIR Bilateral three times each over several yrs  . THUMB ARTHROSCOPY Left   . 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 UMBILICAL HERNIA REPAIR N/A 07/15/2014   Procedure: LAPAROSCOPIC UMBILICAL AND INFRAUMBILICAL HERNIA;  Surgeon: Ardeth Sportsman, MD;  Location: [facility] OR;  Service: General;  Laterality: N/A;     Current Meds  Medication Sig  . albuterol (PROAIR HFA) 108 (90 BASE) MCG/ACT inhaler Inhale 2 puffs into the lungs every 4 (four) hours as needed for shortness of breath.   Marland Kitchen aspirin EC 81 MG tablet Take 81 mg by mouth daily.  . calcium-vitamin D (OSCAL WITH D) 500-200 MG-UNIT per tablet Take 1 tablet by mouth every morning.   . carboxymethylcellulose (EQ RESTORE TEARS) 0.5 % SOLN Place 1 drop into both eyes daily.  . cholecalciferol (VITAMIN D) 1000 UNITS tablet Take 1,000 Units by mouth daily.   . diclofenac sodium (VOLTAREN) 1 % GEL Apply 2 g topically 2 (two) times daily as needed (For pain.).   Marland Kitchen Digestive Enzymes (ENZYME DIGEST PO) Take 1 tablet by mouth daily.   . diphenhydrAMINE (BENADRYL) 25 MG tablet Take 25 mg by mouth every 6 (six) hours as needed for allergies.   Marland Kitchen ENSURE PLUS (ENSURE PLUS) LIQD Take 237 mLs by mouth.  . estradiol (ESTRACE) 0.5  MG tablet Take 0.5 mg by mouth every other day.   . fish oil-omega-3 fatty acids 1000 MG capsule Take 1,000 mg by mouth 2 (two) times daily.   . Guaifenesin (MUCINEX MAXIMUM STRENGTH) 1200 MG TB12 Take 1,200 mg by mouth 2 (two) times daily.  Marland Kitchen HYDROcodone-acetaminophen (NORCO) 10-325 MG tablet Take 1-2 tablets by mouth every 6 (six) hours as needed for severe pain.  Marland Kitchen levothyroxine (SYNTHROID, LEVOTHROID) 50 MCG tablet Take 50 mcg by mouth every morning.   . loperamide (IMODIUM A-D) 2 MG tablet Take 2-4 mg by mouth 4 (four) times daily as needed for diarrhea or loose stools.  Marland Kitchen losartan (COZAAR) 25 MG tablet Take 25 mg by mouth daily.   Marland Kitchen  magnesium 30 MG tablet Take 30 mg by mouth daily.   . Multiple Vitamins-Minerals (MULTIVITAMIN PO) Take 1 tablet by mouth daily.  Marland Kitchen omeprazole (PRILOSEC) 20 MG capsule Take 20 mg by mouth daily.   . ondansetron (ZOFRAN) 8 MG tablet Take 1 tablet (8 mg total) by mouth every 8 (eight) hours as needed for nausea or vomiting.  Marland Kitchen POTASSIUM PO Take 1 tablet by mouth daily.  . Probiotic Product (PROBIOTIC DAILY PO) Take 1 capsule by mouth daily.   . prochlorperazine (COMPAZINE) 10 MG tablet Take 5 mg by mouth every 8 (eight) hours as needed (For nausea.).   Marland Kitchen promethazine (PHENERGAN) 25 MG tablet Take 1 tablet (25 mg total) by mouth every 6 (six) hours as needed for nausea or vomiting.  . Red Yeast Rice 600 MG CAPS Take 1,200 mg by mouth daily after supper.   . Turmeric 500 MG CAPS Take 500 mg by mouth at bedtime.   . vitamin B-12 (CYANOCOBALAMIN) 250 MCG tablet Take 250 mcg by mouth at bedtime.      Allergies:   Dilaudid [hydromorphone hcl]; Gabapentin; Lyrica [pregabalin]; Oxycodone; Singulair [montelukast sodium]; Banana; Ciprofloxacin hcl; Codeine; Methadone; Metronidazole; Oysters [shellfish allergy]; Clindamycin/lincomycin; Sulfa antibiotics; Betadine [povidone iodine]; Latex; Nucynta [tapentadol]; Other; Penicillins; and Sulfur   Social History   Tobacco Use   . Smoking status: Former Smoker    Packs/day: 0.50    Years: 2.00    Pack years: 1.00    Types: Cigarettes    Last attempt to quit: 02/07/1992    Years since quitting: 27.2  . Smokeless tobacco: Never Used  Substance Use Topics  . Alcohol use: No  . Drug use: No     Family Hx: The patient's family history includes Stroke in her father.  ROS:   Please see the history of present illness.     All other systems reviewed and are negative.   Prior CV studies:   The following studies were reviewed today:    Labs/Other Tests and Data Reviewed:    EKG:  An ECG dated 07/02/2016 was personally reviewed today and demonstrated:  Normal sinus rhythm with short PR interval  Recent Labs: No results found for requested labs within last 8760 hours.   Recent Lipid Panel No results found for: CHOL, TRIG, HDL, CHOLHDL, LDLCALC, LDLDIRECT  Wt Readings from Last 3 Encounters:  05/01/19 150 lb (68 kg)  07/16/16 136 lb 1.6 oz (61.7 kg)  07/02/16 136 lb 1.6 oz (61.7 kg)     Objective:    Vital Signs:  BP (!) 159/68   Pulse 83   Ht  (1.575 m)   Wt 150 lb (68 kg)   BMI 27.44 kg/m    VITAL SIGNS:  reviewed  ASSESSMENT & PLAN:    1. Bilateral leg edema much worse on the right side: From her description, it seems to be due to chronic venous insufficiency but the unilateral nature sometimes suggest a proximal obstructive etiology.  I advised her to elevate her legs during the day.  We have to check her cardiac status and make sure there is no evidence of pulmonary hypertension or heart failure. 2. Cardiac murmur with progressive leg edema and shortness of breath: The patient is scheduled for an echocardiogram today. 3. Essential hypertension: Blood repeat blood pressure was controlled.  Continue losartan.  COVID-19 Education: The signs and symptoms of COVID-19 were discussed with the patient and how to seek care for testing (follow up with PCP or arrange  E-visit).  The importance of  social distancing was discussed today.  Time:   Today, I have spent 15 minutes with the patient with telehealth technology discussing the above problems.     Medication Adjustments/Labs and Tests Ordered: Current medicines are reviewed at length with the patient today.  Concerns regarding medicines are outlined above.   Tests Ordered: No orders of the defined types were placed in this encounter.   Medication Changes: No orders of the defined types were placed in this encounter.   Disposition:  Follow up in 3 week(s)  Signed, Lorine BearsMuhammad Brenee Gajda, MD  05/01/2019 10:26 AM    [facility]

## 2019-05-03 ENCOUNTER — Encounter (HOSPITAL_BASED_OUTPATIENT_CLINIC_OR_DEPARTMENT_OTHER): Payer: Medicare Other | Attending: Internal Medicine

## 2019-05-03 DIAGNOSIS — I87321 Chronic venous hypertension (idiopathic) with inflammation of right lower extremity: Secondary | ICD-10-CM | POA: Insufficient documentation

## 2019-05-03 DIAGNOSIS — I349 Nonrheumatic mitral valve disorder, unspecified: Secondary | ICD-10-CM | POA: Diagnosis not present

## 2019-05-03 DIAGNOSIS — I10 Essential (primary) hypertension: Secondary | ICD-10-CM | POA: Insufficient documentation

## 2019-05-03 DIAGNOSIS — L97812 Non-pressure chronic ulcer of other part of right lower leg with fat layer exposed: Secondary | ICD-10-CM | POA: Diagnosis not present

## 2019-05-09 ENCOUNTER — Telehealth: Payer: Self-pay | Admitting: *Deleted

## 2019-05-09 NOTE — Telephone Encounter (Signed)
Left a message for the patient to call back to schedule her an office appointment with Dr. Fletcher Anon.

## 2019-05-09 NOTE — Telephone Encounter (Signed)
Left a message for the patient to call back.  

## 2019-05-09 NOTE — Telephone Encounter (Signed)
An in office appointment has been made for 05/15/2019 with Dr. Fletcher Anon. The patient has verbalized her understanding to come alone and to wear a mask.      COVID-19 Pre-Screening Questions:  . In the past 7 to 10 days have you had a cough,  shortness of breath, headache, congestion, fever (100 or greater) body aches, chills, sore throat, or sudden loss of taste or sense of smell? No . Have you been around anyone with known Covid 19. No . Have you been around anyone who is awaiting Covid 19 test results in the past 7 to 10 days? No . Have you been around anyone who has been exposed to Covid 19, or has mentioned symptoms of Covid 19 within the past 7 to 10 days? No  If you have any concerns/questions about symptoms patients report during screening (either on the phone or at threshold). Contact the provider seeing the patient or DOD for further guidance.  If neither are available contact a member of the leadership team.

## 2019-05-09 NOTE — Telephone Encounter (Signed)
Follow up: ° ° °Patient returning call back °

## 2019-05-10 DIAGNOSIS — L97812 Non-pressure chronic ulcer of other part of right lower leg with fat layer exposed: Secondary | ICD-10-CM | POA: Diagnosis not present

## 2019-05-15 ENCOUNTER — Encounter: Payer: Self-pay | Admitting: Cardiovascular Disease

## 2019-05-15 ENCOUNTER — Other Ambulatory Visit: Payer: Self-pay

## 2019-05-15 ENCOUNTER — Ambulatory Visit (INDEPENDENT_AMBULATORY_CARE_PROVIDER_SITE_OTHER): Payer: Medicare Other | Admitting: Cardiovascular Disease

## 2019-05-15 VITALS — BP 110/68 | HR 94 | Temp 98.2°F | Ht 62.0 in | Wt 152.0 lb

## 2019-05-15 DIAGNOSIS — I350 Nonrheumatic aortic (valve) stenosis: Secondary | ICD-10-CM | POA: Diagnosis not present

## 2019-05-15 DIAGNOSIS — I872 Venous insufficiency (chronic) (peripheral): Secondary | ICD-10-CM | POA: Diagnosis not present

## 2019-05-15 DIAGNOSIS — I10 Essential (primary) hypertension: Secondary | ICD-10-CM

## 2019-05-15 NOTE — Progress Notes (Signed)
Cardiology Office Note   Date:  05/15/2019   ID:  Isabel Barnes, DOB 09/01/1941, MRN [id number]  PCP:  Jamal CollinHedgecock, Suzanne, PA-C  Cardiologist:   Lorine BearsMuhammad Tishana Clinkenbeard, MD   Chief Complaint  Patient presents with  . Follow-up      History of Present Illness: Isabel Barnes is a 78 y.o. female who presents for a follow-up visit regarding leg edema and a heart murmur.  She has known history of hypertension controlled with losartan.  In addition, she has extensive arthritis, spondylosis and chronic pain.  She is a previous smoker and has no history of diabetes.  She reports prolonged symptoms of bilateral leg edema worse on the right side.  She had an injury to the right leg as she hit her leg on the sharp edge of the dishwasher which resulted in a wound that has been slow to heal.  In addition, leg edema and redness worsened. She has been followed with Dr. Leanord Hawkingobson at the wound clinic with gradual improvement.   She underwent lower extremity venous Doppler which showed no evidence of DVT but she was told about superficial thrombophlebitis.  She also reports having a screening ABI with Dr. Leanord Hawkingobson and was told that the circulation was close to normal.   She underwent recent echocardiogram which showed normal LV systolic function with grade 2 diastolic dysfunction, mild pulmonary hypertension and mild aortic stenosis. The wound on the right leg is improving but she continues to have bilateral leg edema.    Past Medical History:  Diagnosis Date  . Anemia    iron deficiency hx.has had iron infusions before   . Chronic low back pain   . Chronic pain disorder   . Complication of anesthesia    severe claustrophobia  . Constipation    r/t use of pain meds.Takes OTC meds or eats prunes  . GERD (gastroesophageal reflux disease)    takes Omeprazole daily  . Heart murmur    "slight"  . History of bronchitis    20+ yrs ago  . History of kidney stones   . History of prolapse of bladder   .  History of shingles   . Hypertension    takes Losartan daily  . Hypothyroidism    takes Synthroid daily  . Joint swelling   . Neck pain    bone spurs at base of head per pt  . Osteoarthritis    lumbar,cervical,joints  . Pneumonia    hx of > 20 yrs ago  . Shortness of breath    occasionally and with exertion. Albuterol inhaler as needed  . Spinal headache 1991   blood patch placed  . Spondylitis (HCC)   . Unsteady gait    occasionally  . Urinary urgency     Past Surgical History:  Procedure Laterality Date  . ABDOMINAL HYSTERECTOMY    . ANTERIOR FUSION CERVICAL SPINE     x2 -C4-7  . BUNIONECTOMY Bilateral   . COLONOSCOPY    . CYSTOSCOPY W/ URETEROSCOPY  2012  . EYE SURGERY Bilateral    cataract /lens implant  . HOLMIUM LASER APPLICATION Left 02/08/2013   Procedure: HOLMIUM LASER APPLICATION;  Surgeon: Anner CreteJohn J Wrenn, MD;  [facility] [place];  Service: Urology;  Laterality: Left;  . INSERTION OF MESH N/A 07/15/2014   Procedure: INSERTION OF MESH;  Surgeon: Ardeth SportsmanSteven C. Gross, MD;  Location: [facility] OR;  Service: General;  Laterality: N/A;  . JOINT REPLACEMENT Right 2012   shoulder  .  LAPAROSCOPIC CHOLECYSTECTOMY W/ CHOLANGIOGRAPHY  2012   Dr Magnus IvanBlackman  . NASAL SINUS SURGERY    . RADIOLOGY WITH ANESTHESIA N/A 05/09/2014   Procedure: ADULT SEDATION WITH ANESTHESIA/MRI CERVICAL SPINE WITHOUT CONTRAST;  Surgeon: Medication Radiologist, MD;  Location: [facility] OR;  Service: Radiology;  Laterality: N/A;  DR. HAWKS/MRI  . right knee arthroscopy     d/t meniscal tear  . SHOULDER ARTHROSCOPY W/ ROTATOR CUFF REPAIR Bilateral three times each over several yrs  . THUMB ARTHROSCOPY Left   . 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   Procedure: RIGHT TOTAL KNEE ARTHROPLASTY;  Surgeon: Jodi GeraldsJohn Graves, MD;  Location: [facility] OR;  Service: Orthopedics;  Laterality: Right;  . UMBILICAL HERNIA REPAIR N/A 07/15/2014   Procedure: LAPAROSCOPIC UMBILICAL AND INFRAUMBILICAL HERNIA;  Surgeon: Ardeth SportsmanSteven C.  Gross, MD;  Location: [facility] OR;  Service: General;  Laterality: N/A;     Current Outpatient Medications  Medication Sig Dispense Refill  . albuterol (PROAIR HFA) 108 (90 BASE) MCG/ACT inhaler Inhale 2 puffs into the lungs every 4 (four) hours as needed for shortness of breath.     Marland Kitchen. aspirin EC 81 MG tablet Take 81 mg by mouth daily.    . calcium-vitamin D (OSCAL WITH D) 500-200 MG-UNIT per tablet Take 1 tablet by mouth every morning.     . carboxymethylcellulose (EQ RESTORE TEARS) 0.5 % SOLN Place 1 drop into both eyes daily.    . cholecalciferol (VITAMIN D) 1000 UNITS tablet Take 1,000 Units by mouth daily.     . diclofenac sodium (VOLTAREN) 1 % GEL Apply 2 g topically 2 (two) times daily as needed (For pain.).     Marland Kitchen. Digestive Enzymes (ENZYME DIGEST PO) Take 1 tablet by mouth daily.     . diphenhydrAMINE (BENADRYL) 25 MG tablet Take 25 mg by mouth every 6 (six) hours as needed for allergies.     Marland Kitchen. ENSURE PLUS (ENSURE PLUS) LIQD Take 237 mLs by mouth.    . ezetimibe (ZETIA) 10 MG tablet Take 10 mg by mouth daily.    . fish oil-omega-3 fatty acids 1000 MG capsule Take 1,000 mg by mouth 2 (two) times daily.     . furosemide (LASIX) 20 MG tablet Take 20 mg by mouth daily.    . Guaifenesin (MUCINEX MAXIMUM STRENGTH) 1200 MG TB12 Take 1,200 mg by mouth 2 (two) times daily.    Marland Kitchen. HYDROcodone-acetaminophen (NORCO) 10-325 MG tablet Take 1-2 tablets by mouth every 6 (six) hours as needed for severe pain. 50 tablet 0  . levothyroxine (SYNTHROID, LEVOTHROID) 50 MCG tablet Take 50 mcg by mouth every morning.     . loperamide (IMODIUM A-D) 2 MG tablet Take 4 mg by mouth as needed for diarrhea or loose stools.     Marland Kitchen. losartan (COZAAR) 25 MG tablet Take 25 mg by mouth daily.     . magnesium 30 MG tablet Take 30 mg by mouth daily.     . Multiple Vitamins-Minerals (MULTIVITAMIN PO) Take 1 tablet by mouth daily.    Marland Kitchen. omeprazole (PRILOSEC) 20 MG capsule Take 20 mg by mouth daily.     . ondansetron (ZOFRAN) 8 MG  tablet Take 1 tablet (8 mg total) by mouth every 8 (eight) hours as needed for nausea or vomiting. 20 tablet 0  . POTASSIUM PO Take 1 tablet by mouth daily.    . Probiotic Product (PROBIOTIC DAILY PO) Take 1 capsule by mouth daily.     . prochlorperazine (COMPAZINE) 10 MG tablet Take 5 mg by  mouth every 8 (eight) hours as needed (For nausea.).     Marland Kitchen promethazine (PHENERGAN) 25 MG tablet Take 1 tablet (25 mg total) by mouth every 6 (six) hours as needed for nausea or vomiting. 20 tablet 0  . Red Yeast Rice 600 MG CAPS Take 1,200 mg by mouth daily after supper.     . Turmeric 500 MG CAPS Take 500 mg by mouth at bedtime.     . vitamin B-12 (CYANOCOBALAMIN) 250 MCG tablet Take 250 mcg by mouth at bedtime.      No current facility-administered medications for this visit.     Allergies:   Dilaudid [hydromorphone hcl], Gabapentin, Lyrica [pregabalin], Oxycodone, Singulair [montelukast sodium], Banana, Ciprofloxacin hcl, Codeine, Methadone, Metronidazole, Oysters [shellfish allergy], Clindamycin/lincomycin, Sulfa antibiotics, Betadine [povidone iodine], Latex, Nucynta [tapentadol], Other, Penicillins, and Sulfur    Social History:  The patient  reports that she quit smoking about 27 years ago. Her smoking use included cigarettes. She has a 1.00 pack-year smoking history. She has never used smokeless tobacco. She reports that she does not drink alcohol or use drugs.   Family History:  The patient's family history includes Stroke in her father.    ROS:  Please see the history of present illness.   Otherwise, review of systems are positive for none.   All other systems are reviewed and negative.    PHYSICAL EXAM: VS:  BP 110/68   Pulse 94   Temp 98.2 F (36.8 C)   Ht 5\' 2"  (1.575 m)   Wt 152 lb (68.9 kg)   SpO2 97%   BMI 27.80 kg/m  , BMI Body mass index is 27.8 kg/m. GEN: Well nourished, well developed, in no acute distress  HEENT: normal  Neck: no JVD, carotid bruits, or masses Cardiac:  RRR; no  rubs, or gallops.  2 out of 6 crescendo decrescendo systolic murmur in the aortic area which is early peaking. there is moderate bilateral leg edema with stasis dermatitis.  Respiratory:  clear to auscultation bilaterally, normal work of breathing GI: soft, nontender, nondistended, + BS MS: no deformity or atrophy  Skin: warm and dry, no rash Neuro:  Strength and sensation are intact Psych: euthymic mood, full affect Vascular: Posterior tibial pulses palpable bilaterally.   EKG:  EKG is ordered today. The ekg ordered today demonstrates normal sinus rhythm with left atrial enlargement.   Recent Labs: No results found for requested labs within last 8760 hours.    Lipid Panel No results found for: CHOL, TRIG, HDL, CHOLHDL, VLDL, LDLCALC, LDLDIRECT    Wt Readings from Last 3 Encounters:  05/15/19 152 lb (68.9 kg)  05/01/19 150 lb (68 kg)  07/16/16 136 lb 1.6 oz (61.7 kg)        No flowsheet data found.    ASSESSMENT AND PLAN:   1. Chronic venous insufficiency: This is the likely culprit for bilateral leg edema which is worse on the right side likely due to the presence of her wound.  Continue compressive therapy and leg elevation as is being done.  Echocardiogram was overall reassuring.  If there is no improvement in this over the next few months, she might be a good candidate for a lymphedema pump which can be considered at that time.   2. Aortic stenosis: This is the cause of her cardiac murmur.  This is still mild and should be monitored by a repeat echocardiogram in 2 to 3 years.  Echocardiogram today. 3. Essential hypertension: Blood pressure is controlled.  Continue losartan.     Disposition:   FU with me in 3 months  Signed,  Lorine BearsMuhammad Kadar Chance, MD  05/15/2019 10:49 AM    [facility]

## 2019-05-15 NOTE — Patient Instructions (Signed)
Medication Instructions:  Your physician recommends that you continue on your current medications as directed. Please refer to the Current Medication list given to you today.  If you need a refill on your cardiac medications before your next appointment, please call your pharmacy.   Lab work: NONE   Testing/Procedures: NONE   Follow-Up: Your physician recommends that you schedule a follow-up appointment in: 3 MONTH

## 2019-05-17 DIAGNOSIS — L97812 Non-pressure chronic ulcer of other part of right lower leg with fat layer exposed: Secondary | ICD-10-CM | POA: Diagnosis not present

## 2019-08-21 ENCOUNTER — Ambulatory Visit: Payer: Medicare Other | Admitting: Cardiovascular Disease

## 2019-10-09 ENCOUNTER — Ambulatory Visit: Payer: Medicare Other | Admitting: Cardiovascular Disease

## 2019-10-09 NOTE — Progress Notes (Deleted)
Cardiology Office Note   Date:  10/09/2019   ID:  Charrisse, Masley 1941-03-01, MRN [id number]  PCP:  Ardith Dark, PA-C  Cardiologist:   Kathlyn Sacramento, MD   No chief complaint on file.     History of Present Illness: ABRIANNA SIDMAN is a 78 y.o. female who presents for a follow-up visit regarding leg edema and a heart murmur.  She has known history of hypertension controlled with losartan.  In addition, she has extensive arthritis, spondylosis and chronic pain.  She is a previous smoker and has no history of diabetes.  She reports prolonged symptoms of bilateral leg edema worse on the right side.  She had an injury to the right leg as she hit her leg on the sharp edge of the dishwasher which resulted in a wound that has been slow to heal.  In addition, leg edema and redness worsened. She has been followed with Dr. Dellia Nims at the wound clinic with gradual improvement.   She underwent lower extremity venous Doppler which showed no evidence of DVT but she was told about superficial thrombophlebitis.  She also reports having a screening ABI with Dr. Dellia Nims and was told that the circulation was close to normal.   She underwent recent echocardiogram which showed normal LV systolic function with grade 2 diastolic dysfunction, mild pulmonary hypertension and mild aortic stenosis. The wound on the right leg is improving but she continues to have bilateral leg edema.    Past Medical History:  Diagnosis Date  . Anemia    iron deficiency hx.has had iron infusions before   . Chronic low back pain   . Chronic pain disorder   . Complication of anesthesia    severe claustrophobia  . Constipation    r/t use of pain meds.Takes OTC meds or eats prunes  . GERD (gastroesophageal reflux disease)    takes Omeprazole daily  . Heart murmur    "slight"  . History of bronchitis    20+ yrs ago  . History of kidney stones   . History of prolapse of bladder   . History of shingles   .  Hypertension    takes Losartan daily  . Hypothyroidism    takes Synthroid daily  . Joint swelling   . Neck pain    bone spurs at base of head per pt  . Osteoarthritis    lumbar,cervical,joints  . Pneumonia    hx of > 20 yrs ago  . Shortness of breath    occasionally and with exertion. Albuterol inhaler as needed  . Spinal headache 1991   blood patch placed  . 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 Unsteady gait    occasionally  . Urinary urgency     Past Surgical History:  Procedure Laterality Date  . ABDOMINAL HYSTERECTOMY    . ANTERIOR FUSION CERVICAL SPINE     x2 -C4-7  . BUNIONECTOMY Bilateral   . COLONOSCOPY    . CYSTOSCOPY W/ URETEROSCOPY  2012  . EYE SURGERY Bilateral    cataract /lens implant  . HOLMIUM LASER APPLICATION Left 1/76/1607   Procedure: HOLMIUM LASER APPLICATION;  Surgeon: Malka So, MD;  Location: [facility];  Service: Urology;  Laterality: Left;  . INSERTION OF MESH N/A 07/15/2014   Procedure: INSERTION OF MESH;  Surgeon: Adin Hector, MD;  Location: [facility];  Service: General;  Laterality: N/A;  . JOINT REPLACEMENT Right 2012   shoulder  . LAPAROSCOPIC CHOLECYSTECTOMY W/ CHOLANGIOGRAPHY  2012   Dr Magnus Ivan  . NASAL SINUS SURGERY    . RADIOLOGY WITH ANESTHESIA N/A 05/09/2014   Procedure: ADULT SEDATION WITH ANESTHESIA/MRI CERVICAL SPINE WITHOUT CONTRAST;  Surgeon: Medication Radiologist, MD;  Location: [facility] OR;  Service: Radiology;  Laterality: N/A;  DR. HAWKS/MRI  . right knee arthroscopy     d/t meniscal tear  . SHOULDER ARTHROSCOPY W/ ROTATOR CUFF REPAIR Bilateral three times each over several yrs  . THUMB ARTHROSCOPY Left   . 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 UMBILICAL HERNIA REPAIR N/A 07/15/2014   Procedure: LAPAROSCOPIC UMBILICAL AND INFRAUMBILICAL HERNIA;  Surgeon: Ardeth Sportsman, MD;  Location: [facility]  OR;  Service: General;  Laterality: N/A;     Current Outpatient Medications  Medication Sig Dispense Refill  . albuterol (PROAIR HFA) 108 (90 BASE) MCG/ACT inhaler Inhale 2 puffs into the lungs every 4 (four) hours as needed for shortness of breath.     Marland Kitchen aspirin EC 81 MG tablet Take 81 mg by mouth daily.    . calcium-vitamin D (OSCAL WITH D) 500-200 MG-UNIT per tablet Take 1 tablet by mouth every morning.     . carboxymethylcellulose (EQ RESTORE TEARS) 0.5 % SOLN Place 1 drop into both eyes daily.    . cholecalciferol (VITAMIN D) 1000 UNITS tablet Take 1,000 Units by mouth daily.     . diclofenac sodium (VOLTAREN) 1 % GEL Apply 2 g topically 2 (two) times daily as needed (For pain.).     Marland Kitchen Digestive Enzymes (ENZYME DIGEST PO) Take 1 tablet by mouth daily.     . diphenhydrAMINE (BENADRYL) 25 MG tablet Take 25 mg by mouth every 6 (six) hours as needed for allergies.     Marland Kitchen ENSURE PLUS (ENSURE PLUS) LIQD Take 237 mLs by mouth.    . ezetimibe (ZETIA) 10 MG tablet Take 10 mg by mouth daily.    . fish oil-omega-3 fatty acids 1000 MG capsule Take 1,000 mg by mouth 2 (two) times daily.     . furosemide (LASIX) 20 MG tablet Take 20 mg by mouth daily.    . Guaifenesin (MUCINEX MAXIMUM STRENGTH) 1200 MG TB12 Take 1,200 mg by mouth 2 (two) times daily.    Marland Kitchen HYDROcodone-acetaminophen (NORCO) 10-325 MG tablet Take 1-2 tablets by mouth every 6 (six) hours as needed for severe pain. 50 tablet 0  . levothyroxine (SYNTHROID, LEVOTHROID) 50 MCG tablet Take 50 mcg by mouth every morning.     . loperamide (IMODIUM A-D) 2 MG tablet Take 4 mg by mouth as needed for diarrhea or loose stools.     Marland Kitchen losartan (COZAAR) 25 MG tablet Take 25 mg by mouth daily.     . magnesium 30 MG tablet Take 30 mg by mouth daily.     . Multiple Vitamins-Minerals (MULTIVITAMIN PO) Take 1 tablet by mouth daily.    Marland Kitchen omeprazole (PRILOSEC) 20 MG capsule Take 20 mg by mouth daily.     . ondansetron (ZOFRAN) 8 MG tablet Take 1 tablet (8 mg  total) by mouth every 8 (eight) hours as needed for nausea or vomiting. 20 tablet 0  . POTASSIUM PO Take 1 tablet by mouth daily.    . Probiotic Product (PROBIOTIC DAILY PO) Take 1 capsule by mouth daily.     . prochlorperazine (COMPAZINE) 10 MG tablet Take 5 mg by mouth every 8 (eight) hours  as needed (For nausea.).     Marland Kitchen. promethazine (PHENERGAN) 25 MG tablet Take 1 tablet (25 mg total) by mouth every 6 (six) hours as needed for nausea or vomiting. 20 tablet 0  . Red Yeast Rice 600 MG CAPS Take 1,200 mg by mouth daily after supper.     . Turmeric 500 MG CAPS Take 500 mg by mouth at bedtime.     . vitamin B-12 (CYANOCOBALAMIN) 250 MCG tablet Take 250 mcg by mouth at bedtime.      No current facility-administered medications for this visit.     Allergies:   Dilaudid [hydromorphone hcl], Gabapentin, Lyrica [pregabalin], Oxycodone, Singulair [montelukast sodium], Banana, Ciprofloxacin hcl, Codeine, Methadone, Metronidazole, Oysters [shellfish allergy], Clindamycin/lincomycin, Sulfa antibiotics, Betadine [povidone iodine], Latex, Nucynta [tapentadol], Other, Penicillins, and Sulfur    Social History:  The patient  reports that she quit smoking about 27 years ago. Her smoking use included cigarettes. She has a 1.00 pack-year smoking history. She has never used smokeless tobacco. She reports that she does not drink alcohol or use drugs.   Family History:  The patient's family history includes Stroke in her father.    ROS:  Please see the history of present illness.   Otherwise, review of systems are positive for none.   All other systems are reviewed and negative.    PHYSICAL EXAM: VS:  There were no vitals taken for this visit. , BMI There is no height or weight on file to calculate BMI. GEN: Well nourished, well developed, in no acute distress  HEENT: normal  Neck: no JVD, carotid bruits, or masses Cardiac: RRR; no  rubs, or gallops.  2 out of 6 crescendo decrescendo systolic murmur in the  aortic area which is early peaking. there is moderate bilateral leg edema with stasis dermatitis.  Respiratory:  clear to auscultation bilaterally, normal work of breathing GI: soft, nontender, nondistended, + BS MS: no deformity or atrophy  Skin: warm and dry, no rash Neuro:  Strength and sensation are intact Psych: euthymic mood, full affect Vascular: Posterior tibial pulses palpable bilaterally.   EKG:  EKG is ordered today. The ekg ordered today demonstrates normal sinus rhythm with left atrial enlargement.   Recent Labs: No results found for requested labs within last 8760 hours.    Lipid Panel No results found for: CHOL, TRIG, HDL, CHOLHDL, VLDL, LDLCALC, LDLDIRECT    Wt Readings from Last 3 Encounters:  05/15/19 152 lb (68.9 kg)  05/01/19 150 lb (68 kg)  07/16/16 136 lb 1.6 oz (61.7 kg)        No flowsheet data found.    ASSESSMENT AND PLAN:   1. Chronic venous insufficiency: This is the likely culprit for bilateral leg edema which is worse on the right side likely due to the presence of her wound.  Continue compressive therapy and leg elevation as is being done.  Echocardiogram was overall reassuring.  If there is no improvement in this over the next few months, she might be a good candidate for a lymphedema pump which can be considered at that time.   2. Aortic stenosis: This is the cause of her cardiac murmur.  This is still mild and should be monitored by a repeat echocardiogram in 2 to 3 years.  Echocardiogram today. 3. Essential hypertension: Blood pressure is controlled.  Continue losartan.     Disposition:   FU with me in 3 months  Signed,  Lorine BearsMuhammad Tyreece Gelles, MD  10/09/2019 10:29 AM    [facility]

## 2019-10-30 ENCOUNTER — Ambulatory Visit: Payer: Medicare Other | Admitting: Cardiovascular Disease

## 2019-12-13 ENCOUNTER — Ambulatory Visit: Payer: Medicare Other | Admitting: Neurology

## 2019-12-13 ENCOUNTER — Encounter: Payer: Self-pay | Admitting: Neurology

## 2019-12-13 VITALS — BP 181/85 | HR 111 | Temp 98.0°F | Ht 60.0 in | Wt 147.0 lb

## 2019-12-13 DIAGNOSIS — R413 Other amnesia: Secondary | ICD-10-CM | POA: Diagnosis not present

## 2019-12-13 DIAGNOSIS — L4050 Arthropathic psoriasis, unspecified: Secondary | ICD-10-CM

## 2019-12-13 NOTE — Progress Notes (Addendum)
Provider:  Larey Seat, MD  Primary Care Physician:  Ardith Dark, [address] [place] [address]     Referring Provider: Ardith Dark, [address] [employer] [place],  [place] [address]          Chief Complaint according to patient   Patient presents with:    . New Patient (Initial Visit)     pt states she has noticed memory problems that have worsened. She may be in middle of conversation with a friend and loose her thought process, forgets step by step cooking / baking recipes. She is easily disoriented and easily distracted. She had dementia and 2 siblings and her mother had dementia.       HISTORY OF PRESENT ILLNESS:  DENEISHA Barnes is a 79  year old White or Caucasian female chronic pain patient seen here upon pain management provider referral on 12/13/2019  for a memory evaluation.    Chief concern according to patient : My memory is getting worse. I am also always in pain, my legs are swollen and I have weeping blisters.    I have the pleasure of seeing Isabel Barnes today, a right-handed Caucasian female with a possible Memory disorder. She states she has noticed memory problems that have worsened. She may be in middle of conversation with a friend and loose her thought process, forgets step by step cooking / baking recipes. She is easily disoriented and easily distracted. She had dementia and 2 siblings and her mother had dementia.  She reports being more lonely during the pandemic, having been isolated.  She  has a past medical history of Anemia, Chronic low back pain, Chronic pain disorder, Complication of anesthesia, Constipation, GERD (gastroesophageal reflux disease), Heart murmur, History of bronchitis, History of kidney stones, History of prolapse of bladder, History of shingles, Hypertension, Hypothyroidism, Joint swelling, Neck pain, Osteoarthritis, Pneumonia, Shortness of breath, Spinal headache (1991),  Spondylitis ([place]), Unsteady gait, and Urinary urgency.      Social history:  Patient is retired from [employer], [place]- and lives in a household alone. Family status is divorced since 1992, with 2 adult children, some grandchildren.  Pets are not present. Tobacco use: quit in 1993.  ETOH use- none, Caffeine intake in form of Coffee( rare) Soda( 1/ week) Tea ( none) or energy drinks. Regular exercise - none    Hobbies : painting, drawing, gardening  Reading .       Sleep habits are as follows: The patient's dinner time is 7 PM.  The patient goes to bed at 12 PM and continues to sleep for 4 hours, wakes from pain- rarely has  bathroom breaks.   Dreams are reportedly rare.  8 AM is the usual rise time. The patient wakes up spontaneously.   She reports not feeling refreshed or restored in AM, with symptoms such as dry mouth, overall pain, achiness, soreness, morning headaches and residual fatigue.    Review of Systems: Out of a complete 14 system review, the patient complains of only the following symptoms, and all other reviewed systems are negative:   Memory loss  Social History   Socioeconomic History  . Marital status: Divorced    Spouse name: Not on file  . Number of children: Not on file  . Years of education: Not on file  . Highest education level: Not on file  Occupational History  . Not on file  Tobacco Use  .  Smoking status: Former Smoker    Packs/day: 0.50    Years: 2.00    Pack years: 1.00    Types: Cigarettes    Quit date: 02/07/1992    Years since quitting: 27.8  . Smokeless tobacco: Never Used  Substance and Sexual Activity  . Alcohol use: No  . Drug use: No  . Sexual activity: Not on file  Other Topics Concern  . Not on file  Social History Narrative  . Not on file   Social Determinants of Health   Financial Resource Strain:   . Difficulty of Paying Living Expenses: Not on file  Food Insecurity:   . Worried About Programme researcher, broadcasting/film/videounning Out of Food  in the Last Year: Not on file  . Ran Out of Food in the Last Year: Not on file  Transportation Needs:   . Lack of Transportation (Medical): Not on file  . Lack of Transportation (Non-Medical): Not on file  Physical Activity:   . Days of Exercise per Week: Not on file  . Minutes of Exercise per Session: Not on file  Stress:   . Feeling of Stress : Not on file  Social Connections:   . Frequency of Communication with Friends and Family: Not on file  . Frequency of Social Gatherings with Friends and Family: Not on file  . Attends Religious Services: Not on file  . Active Member of Clubs or Organizations: Not on file  . Attends BankerClub or Organization Meetings: Not on file  . Marital Status: divorced     Family History  Problem Relation Age of Onset  . Stroke Father     Past Medical History:  Diagnosis Date  . Anemia    iron deficiency hx.has had iron infusions before   . Chronic low back pain   . Chronic pain disorder   . Complication of anesthesia    severe claustrophobia  . Constipation    r/t use of pain meds.Takes OTC meds or eats prunes  . GERD (gastroesophageal reflux disease)    takes Omeprazole daily  . Heart murmur    "slight"  . History of bronchitis    20+ yrs ago  . History of kidney stones   . History of prolapse of bladder   . History of shingles   . Hypertension    takes Losartan daily  . Hypothyroidism    takes Synthroid daily  . Joint swelling   . Neck pain    bone spurs at base of head per pt  . Osteoarthritis    lumbar,cervical,joints  . Pneumonia    hx of > 20 yrs ago  . Shortness of breath    occasionally and with exertion. Albuterol inhaler as needed  . Spinal headache 1991   blood patch placed  . Spondylitis ([facility])   . Unsteady gait    occasionally  . Urinary urgency     Past Surgical History:  Procedure Laterality Date  . ABDOMINAL HYSTERECTOMY    . ANTERIOR FUSION CERVICAL SPINE     x2 -C4-7  . BUNIONECTOMY Bilateral   .  COLONOSCOPY    . CYSTOSCOPY W/ URETEROSCOPY  2012  . EYE SURGERY Bilateral    cataract /lens implant  . HOLMIUM LASER APPLICATION Left 02/08/2013   Procedure: HOLMIUM LASER APPLICATION;  Surgeon: Anner CreteJohn J Wrenn, MD;  [facility];  Service: Urology;  Laterality: Left;  . INSERTION OF MESH N/A 07/15/2014   Procedure: INSERTION OF MESH;  Surgeon: Ardeth SportsmanSteven C. Gross, MD;  Location: [facility];  Service: General;  Laterality: N/A;  . JOINT REPLACEMENT Right 2012   shoulder  . LAPAROSCOPIC CHOLECYSTECTOMY W/ CHOLANGIOGRAPHY  2012   Dr Magnus Ivan  . NASAL SINUS SURGERY    . RADIOLOGY WITH ANESTHESIA N/A 05/09/2014   Procedure: ADULT SEDATION WITH ANESTHESIA/MRI CERVICAL SPINE WITHOUT CONTRAST;  Surgeon: Medication Radiologist, MD;  Location: [facility];  Service: Radiology;  Laterality: N/A;  DR. HAWKS/MRI  . right knee arthroscopy     d/t meniscal tear  . SHOULDER ARTHROSCOPY W/ ROTATOR CUFF REPAIR Bilateral three times each over several yrs  . THUMB ARTHROSCOPY Left   . 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 UMBILICAL HERNIA REPAIR N/A 07/15/2014   Procedure: LAPAROSCOPIC UMBILICAL AND INFRAUMBILICAL HERNIA;  Surgeon: Ardeth Sportsman, MD;  Location: [facility];  Service: General;  Laterality: N/A;     Current Outpatient Medications on File Prior to Visit  Medication Sig Dispense Refill  . albuterol (PROAIR HFA) 108 (90 BASE) MCG/ACT inhaler Inhale 2 puffs into the lungs every 4 (four) hours as needed for shortness of breath.     Marland Kitchen aspirin EC 81 MG tablet Take 81 mg by mouth daily.    . calcium-vitamin D (OSCAL WITH D) 500-200 MG-UNIT per tablet Take 1 tablet by mouth every morning.     . carboxymethylcellulose (EQ RESTORE TEARS) 0.5 % SOLN Place 1 drop into both eyes daily.    . cholecalciferol (VITAMIN D) 1000 UNITS tablet Take 1,000 Units by mouth daily.      . diclofenac sodium (VOLTAREN) 1 % GEL Apply 2 g topically 2 (two) times daily as needed (For pain.).     Marland Kitchen Digestive Enzymes (ENZYME DIGEST PO) Take 1 tablet by mouth daily.     . diphenhydrAMINE (BENADRYL) 25 MG tablet Take 25 mg by mouth every 6 (six) hours as needed for allergies.     Marland Kitchen ENSURE PLUS (ENSURE PLUS) LIQD Take 237 mLs by mouth daily as needed.     . ezetimibe (ZETIA) 10 MG tablet Take 10 mg by mouth daily.    . fish oil-omega-3 fatty acids 1000 MG capsule Take 1,000 mg by mouth 2 (two) times daily.     . furosemide (LASIX) 20 MG tablet Take 20 mg by mouth daily.    Marland Kitchen HYDROcodone-acetaminophen (NORCO) 10-325 MG tablet Take 1-2 tablets by mouth every 6 (six) hours as needed for severe pain. 50 tablet 0  . levothyroxine (SYNTHROID, LEVOTHROID) 50 MCG tablet Take 50 mcg by mouth every morning.     . loperamide (IMODIUM A-D) 2 MG tablet Take 4 mg by mouth as needed for diarrhea or loose stools.     Marland Kitchen losartan (COZAAR) 25 MG tablet Take 25 mg by mouth daily.     . magnesium 30 MG tablet Take 30 mg by mouth daily.     . methocarbamol (ROBAXIN) 500 MG tablet Take 500 mg by mouth 3 (three) times daily as needed.    . Multiple Vitamins-Minerals (MULTIVITAMIN PO) Take 1 tablet by mouth daily.    Marland Kitchen omeprazole (PRILOSEC) 20 MG capsule Take 20 mg by mouth daily.     . ondansetron (ZOFRAN) 8 MG tablet Take 1 tablet (8 mg total) by mouth every 8 (eight) hours as needed for nausea or vomiting. 20 tablet 0  . POTASSIUM PO Take 1 tablet by mouth daily.    Marland Kitchen  predniSONE (DELTASONE) 5 MG tablet Take 5 mg by mouth daily.    . Probiotic Product (PROBIOTIC DAILY PO) Take 1 capsule by mouth daily.     . prochlorperazine (COMPAZINE) 10 MG tablet Take 5 mg by mouth every 8 (eight) hours as needed (For nausea.).     Marland Kitchen RESTASIS 0.05 % ophthalmic emulsion 1 drop 2 (two) times daily.    Marland Kitchen triamcinolone (NASACORT) 55 MCG/ACT AERO nasal inhaler Place into the nose.    . Turmeric 500 MG CAPS Take 500 mg by  mouth at bedtime.     . vitamin B-12 (CYANOCOBALAMIN) 250 MCG tablet Take 250 mcg by mouth at bedtime.      No current facility-administered medications on file prior to visit.    Allergies  Allergen Reactions  . Dilaudid [Hydromorphone Hcl] Shortness Of Breath  . Gabapentin Other (See Comments)    Hoarseness , headache and sore throat  . Lyrica [Pregabalin] Other (See Comments)    No balance , had to walk with cane , Blurred vision,weakness.  . Oxycodone Shortness Of Breath    Cannot breathe.  . Singulair [Montelukast Sodium] Shortness Of Breath  . Banana Other (See Comments)    Abdominal cramping Cantaloupe also  . Ciprofloxacin Hcl Nausea And Vomiting and Other (See Comments)    Nausea and vomiting in po form  . Codeine Other (See Comments)    hallucinations  . Methadone Nausea And Vomiting    severe  . Metronidazole Nausea And Vomiting and Other (See Comments)    Gastric pain  . Oysters [Shellfish Allergy] Other (See Comments)    "Terrible gastric upset and cramping."  . Clindamycin/Lincomycin Diarrhea and Nausea Only  . Sulfa Antibiotics   . Betadine [Povidone Iodine] Rash and Other (See Comments)    Oyster shell products-rash  . Latex Rash  . Nucynta [Tapentadol] Other (See Comments)    Other reaction(s): Other (See Comments) nightmares nightmares  . Other Rash    All Antibiotic ointments/ creams  . Penicillins Nausea And Vomiting and Rash    Has patient had a PCN reaction causing immediate rash, facial/tongue/throat swelling, SOB or lightheadedness with hypotension: Yes Has patient had a PCN reaction causing severe rash involving mucus membranes or skin necrosis: No Has patient had a PCN reaction that required hospitalization No Has patient had a PCN reaction occurring within the last 10 years: No If all of the above answers are "NO", then may proceed with Cephalosporin use.   . Sulfur Nausea And Vomiting    Physical exam:  Today's Vitals   12/13/19 1428    BP: (!) 181/85  Pulse: (!) 111  Temp: 98 F (36.7 C)  Weight: 147 lb (66.7 kg)  Height: 5' (1.524 m)   Body mass index is 28.71 kg/m.   Wt Readings from Last 3 Encounters:  12/13/19 147 lb (66.7 kg)  05/15/19 152 lb (68.9 kg)  05/01/19 150 lb (68 kg)     Ht Readings from Last 3 Encounters:  12/13/19 5' (1.524 m)  05/15/19 5\' 2"  (1.575 m)  05/01/19 5\' 2"  (1.575 m)      General: The patient is awake, alert and appears not in acute distress. The patient is well groomed. She had an extremely high blood pressure.  Head: Normocephalic, atraumatic. Neck is supple.  Cardiovascular:  Regular rate and cardiac rhythm by pulse,  without distended neck veins. Respiratory: Lungs are clear to auscultation.  Skin:  With evidence of ankle edema, or rash, weeping blisters on  both legs.  Trunk: The patient's posture is erect.   Neurologic exam : The patient is awake and alert, oriented to place and time.   Memory subjective described as impaired -   No flowsheet data found. Montreal Cognitive Assessment  12/13/2019  Visuospatial/ Executive (0/5) 5  Naming (0/3) 3  Attention: Read list of digits (0/2) 2  Attention: Read list of letters (0/1) 1  Attention: Serial 7 subtraction starting at 100 (0/3) 2  Language: Repeat phrase (0/2) 2  Language : Fluency (0/1) 1  Abstraction (0/2) 2  Delayed Recall (0/5) 2  Orientation (0/6) 6  Total 26    Attention span & concentration ability appears normal.  Speech is fluent, without  dysarthria, dysphonia or aphasia.  Mood and affect are appropriate.   Cranial nerves: no loss of smell or taste reported.  Pupils are equal and briskly reactive to light. Funduscopic exam deferred.  Extraocular movements in vertical and horizontal planes were intact and without nystagmus. No Diplopia. Visual fields by finger perimetry are intact. Hearing was intact to soft voice and finger rubbing.    Facial sensation intact to fine touch.  Facial motor strength  is symmetric and tongue and uvula move midline.  Neck ROM : rotation, tilt and flexion extension were normal for age and shoulder shrug was symmetrical.    Motor exam:  Symmetric bulk, tfor the upper extremities. Restricted ROM.  Normal tone without cog- wheeling, strength is symmetrically reduced- crippling arthritic changes in elbow, shoulder and all fingers. Loss of  grip strength . She reports dropping things a lot- in the kitchen , too.  Sensory:  Fine touch, pinprick and vibration were normal for arms and hands. l.  Proprioception tested in the upper extremities was normal. Coordination: Rapid alternating movements in the fingers/hands were of normal speed.  The Finger-to-nose maneuver was intact without evidence of ataxia, dysmetria or tremor.  Gait and station: Patient could rise unassisted from a seated position, walked without assistive device.  Toe and heel walk were deferred.  Deep tendon reflexes: in the upper extremities are symmetric and intact.  Babinski response was deferred .     After spending a total time of  45  minutes face to face and additional time for physical and neurologic examination, review of laboratory studies,  personal review of imaging studies, reports and results of other testing and review of referral information / records as far as provided in visit, I have established the following assessments:  1) her MOCA test for a patient with GED (!) is normal for age. There is ST memory loss , but this is not yet a dementia, and may not ever go there. I recommend PCP to follow up with MMSE or MOCA on a 6 month basis, especially because of her family history.   2) she overcame COVID at thanksgiving , felt sick and did not need hospitalization- had anosmia has resolved.    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     I would like to thank Hedgecock, Orlene PlumSuzanne, PA-C and Rubye BeachHedgecock, Suzanne, Pa-c [address] [place],  [place] [id number]  for allowing me to meet with and to take care of this pleasant patient.   In short, Clayburn Pertatricia W Mcconaghy is presenting with short term memory loss, MCI and a 7% risk to convert to dementia.   I plan to follow up prn  through our NP within 12 month.   CC:  I will share my notes with PCP .  Electronically signed by: Melvyn Novas, MD 12/13/2019 2:50 PM  [facility] Board certified by The [employer] of Sleep Medicine and Diplomate of the [employer] of Sleep Medicine. Board certified In Neurology through the ABPN, Fellow of the [employer] of Neu[employer]. Medical Director of [facility].

## 2019-12-13 NOTE — Patient Instructions (Signed)

## 2019-12-16 ENCOUNTER — Encounter: Payer: Self-pay | Admitting: *Deleted

## 2019-12-16 ENCOUNTER — Ambulatory Visit: Payer: Medicare Other | Attending: Internal Medicine

## 2019-12-16 DIAGNOSIS — Z23 Encounter for immunization: Secondary | ICD-10-CM

## 2019-12-16 NOTE — Progress Notes (Unsigned)
   Covid-19 Vaccination Clinic  Name:  Isabel Barnes    MRN: [id number] DOB: 31-Aug-1941  12/16/2019  [name] was observed post Covid-19 immunization for 15 minutes without incidence. She was provided with Vaccine Information Sheet and instruction to access the V-Safe system.   [name] was instructed to call 911 with any severe reactions post vaccine: Marland Kitchen Difficulty breathing  . Swelling of your face and throat  . A fast heartbeat  . A bad rash all over your body  . Dizziness and weakness

## 2020-01-01 ENCOUNTER — Encounter: Payer: Self-pay | Admitting: Cardiovascular Disease

## 2020-01-01 ENCOUNTER — Ambulatory Visit: Payer: Medicare Other | Admitting: Cardiovascular Disease

## 2020-01-01 ENCOUNTER — Other Ambulatory Visit: Payer: Self-pay

## 2020-01-01 VITALS — BP 138/64 | HR 74 | Ht 62.0 in | Wt 142.0 lb

## 2020-01-01 DIAGNOSIS — I10 Essential (primary) hypertension: Secondary | ICD-10-CM

## 2020-01-01 DIAGNOSIS — I890 Lymphedema, not elsewhere classified: Secondary | ICD-10-CM

## 2020-01-01 DIAGNOSIS — I872 Venous insufficiency (chronic) (peripheral): Secondary | ICD-10-CM | POA: Diagnosis not present

## 2020-01-01 DIAGNOSIS — I350 Nonrheumatic aortic (valve) stenosis: Secondary | ICD-10-CM | POA: Diagnosis not present

## 2020-01-01 NOTE — Patient Instructions (Signed)
Medication Instructions:  No changes *If you need a refill on your cardiac medications before your next appointment, please call your pharmacy*  Lab Work: None ordered If you have labs (blood work) drawn today and your tests are completely normal, you will receive your results only by: Marland Kitchen MyChart Message (if you have MyChart) OR . A paper copy in the mail If you have any lab test that is abnormal or we need to change your treatment, we will call you to review the results.  Testing/Procedures: None ordered  Follow-Up: At [facility], you and your health needs are our priority.  As part of our continuing mission to provide you with exceptional heart care, we have created designated Provider Care Teams.  These Care Teams include your primary Cardiologist (physician) and Advanced Practice Providers (APPs -  Physician Assistants and Nurse Practitioners) who all work together to provide you with the care you need, when you need it.  Your next appointment:   12 month(s)  The format for your next appointment:   In Person  Provider:   Lorine Bears, MD  Other Instructions Dr. Kirke Corin would like for you to try the Lymphedema pump. We will get this ordered and someone from the company, [employer], will reach out to you to set this up.

## 2020-01-01 NOTE — Progress Notes (Signed)
Cardiology Office Note   Date:  01/04/2020   ID:  Chakira, Jachim [birth year], MRN [id number]  PCP:  Jamal Collin, PA-C  Cardiologist:   Lorine Bears, MD   No chief complaint on file.     History of Present Illness: Isabel Barnes is a 79 y.o. 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  In addition, she has mild aortic stenosis.   She has known history of hypertension controlled with losartan.  In addition, she has extensive arthritis, spondylosis and chronic pain.  She is a previous smoker and has no history of diabetes.   She was seen last year for severe bilateral leg edema worse on the right side with recurrent superficial wounds. She underwent lower extremity venous Doppler which showed no evidence of DVT but she was told about superficial thrombophlebitis.  She also reports having a screening ABI with Dr. Leanord Hawking and was told that the circulation was close to normal.   Echocardiogram in 2020 showed normal LV systolic function with grade 2 diastolic dysfunction, mild pulmonary hypertension and mild aortic stenosis.  The patient was treated with leg elevation as well as knee-high support stockings.  She has been following these instructions faithfully but unfortunately she continues to have severe edema especially on the right side.  She continues to have recurrent blistering leading to superficial wound. She has significant generalized pain related to arthritis.  She denies chest pain or shortness of breath.    Past Medical History:  Diagnosis Date  . Anemia    iron deficiency hx.has had iron infusions before   . Chronic low back pain   . Chronic pain disorder   . Complication of anesthesia    severe claustrophobia  . Constipation    r/t use of pain meds.Takes OTC meds or eats prunes  . GERD (gastroesophageal reflux disease)    takes Omeprazole daily  . Heart  murmur    "slight"  . History of bronchitis    20+ yrs ago  . History of kidney stones   . History of prolapse of bladder   . History of shingles   . Hypertension    takes Losartan daily  . Hypothyroidism    takes Synthroid daily  . Joint swelling   . Neck pain    bone spurs at base of head per pt  . Osteoarthritis    lumbar,cervical,joints  . Pneumonia    hx of > 20 yrs ago  . Shortness of breath    occasionally and with exertion. Albuterol inhaler as needed  . Spinal headache 1991   blood patch placed  . Spondylitis (HCC)   . Unsteady gait    occasionally  . Urinary urgency     Past Surgical History:  Procedure Laterality Date  . ABDOMINAL HYSTERECTOMY    . ANTERIOR FUSION CERVICAL SPINE     x2 -C4-7  . BUNIONECTOMY Bilateral   . COLONOSCOPY    . CYSTOSCOPY W/ URETEROSCOPY  2012  . EYE SURGERY Bilateral    cataract /lens implant  . HOLMIUM LASER APPLICATION Left 02/08/2013   Procedure: HOLMIUM LASER APPLICATION;  Surgeon: Anner Crete, MD;  Location: [facility];  Service: Urology;  Laterality: Left;  . INSERTION OF MESH N/A 07/15/2014   Procedure: INSERTION OF MESH;  Surgeon: Ardeth Sportsman, MD;  Location: [facility] OR;  Service: General;  Laterality: N/A;  . JOINT REPLACEMENT Right 2012  shoulder  . LAPAROSCOPIC CHOLECYSTECTOMY W/ CHOLANGIOGRAPHY  2012   Dr Magnus Ivan  . NASAL SINUS SURGERY    . RADIOLOGY WITH ANESTHESIA N/A 05/09/2014   Procedure: ADULT SEDATION WITH ANESTHESIA/MRI CERVICAL SPINE WITHOUT CONTRAST;  Surgeon: Medication Radiologist, MD;  Location: [facility] OR;  Service: Radiology;  Laterality: N/A;  DR. HAWKS/MRI  . right knee arthroscopy     d/t meniscal tear  . SHOULDER ARTHROSCOPY W/ ROTATOR CUFF REPAIR Bilateral three times each over several yrs  . THUMB ARTHROSCOPY Left   . 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 UMBILICAL HERNIA REPAIR N/A 07/15/2014   Procedure: LAPAROSCOPIC UMBILICAL AND INFRAUMBILICAL HERNIA;  Surgeon: Ardeth Sportsman, MD;  Location: [facility] OR;  Service: General;  Laterality: N/A;     Current Outpatient Medications  Medication Sig Dispense Refill  . albuterol (PROAIR HFA) 108 (90 BASE) MCG/ACT inhaler Inhale 2 puffs into the lungs every 4 (four) hours as needed for shortness of breath.     Marland Kitchen aspirin EC 81 MG tablet Take 81 mg by mouth daily.    . calcium-vitamin D (OSCAL WITH D) 500-200 MG-UNIT per tablet Take 1 tablet by mouth every morning.     . carboxymethylcellulose (EQ RESTORE TEARS) 0.5 % SOLN Place 1 drop into both eyes daily.    . cholecalciferol (VITAMIN D) 1000 UNITS tablet Take 1,000 Units by mouth daily.     . diclofenac sodium (VOLTAREN) 1 % GEL Apply 2 g topically 2 (two) times daily as needed (For pain.).     Marland Kitchen Digestive Enzymes (ENZYME DIGEST PO) Take 1 tablet by mouth daily.     . diphenhydrAMINE (BENADRYL) 25 MG tablet Take 25 mg by mouth every 6 (six) hours as needed for allergies.     Marland Kitchen ENSURE PLUS (ENSURE PLUS) LIQD Take 237 mLs by mouth daily as needed.     . ezetimibe (ZETIA) 10 MG tablet Take 10 mg by mouth daily.    . fish oil-omega-3 fatty acids 1000 MG capsule Take 1,000 mg by mouth 2 (two) times daily.     . furosemide (LASIX) 20 MG tablet Take 20 mg by mouth daily.    Marland Kitchen HYDROcodone-acetaminophen (NORCO) 10-325 MG tablet Take 1-2 tablets by mouth every 6 (six) hours as needed for severe pain. 50 tablet 0  . levothyroxine (SYNTHROID, LEVOTHROID) 50 MCG tablet Take 50 mcg by mouth every morning.     . loperamide (IMODIUM A-D) 2 MG tablet Take 4 mg by mouth as needed for diarrhea or loose stools.     Marland Kitchen losartan (COZAAR) 25 MG tablet Take 25 mg by mouth daily.     . magnesium 30 MG tablet Take 30 mg by mouth daily.     . methocarbamol (ROBAXIN) 500 MG tablet Take 500 mg by mouth 3 (three) times daily as needed.    . Multiple Vitamins-Minerals  (MULTIVITAMIN PO) Take 1 tablet by mouth daily.    Marland Kitchen omeprazole (PRILOSEC) 20 MG capsule Take 20 mg by mouth daily.     . ondansetron (ZOFRAN) 8 MG tablet Take 1 tablet (8 mg total) by mouth every 8 (eight) hours as needed for nausea or vomiting. 20 tablet 0  . POTASSIUM PO Take 1 tablet by mouth daily.    . predniSONE (DELTASONE) 5 MG tablet Take 5 mg by mouth daily.    . Probiotic Product (PROBIOTIC  DAILY PO) Take 1 capsule by mouth daily.     . prochlorperazine (COMPAZINE) 10 MG tablet Take 5 mg by mouth every 8 (eight) hours as needed (For nausea.).     Marland Kitchen RESTASIS 0.05 % ophthalmic emulsion 1 drop 2 (two) times daily.    Marland Kitchen triamcinolone (NASACORT) 55 MCG/ACT AERO nasal inhaler Place into the nose.    . Turmeric 500 MG CAPS Take 500 mg by mouth at bedtime.     . vitamin B-12 (CYANOCOBALAMIN) 250 MCG tablet Take 250 mcg by mouth at bedtime.      No current facility-administered medications for this visit.    Allergies:   Dilaudid [hydromorphone hcl], Gabapentin, Lyrica [pregabalin], Oxycodone, Singulair [montelukast sodium], Banana, Ciprofloxacin hcl, Codeine, Methadone, Metronidazole, Oysters [shellfish allergy], Clindamycin/lincomycin, Sulfa antibiotics, Betadine [povidone iodine], Latex, Nucynta [tapentadol], Other, Penicillins, and Sulfur    Social History:  The patient  reports that she quit smoking about 27 years ago. Her smoking use included cigarettes. She has a 1.00 pack-year smoking history. She has never used smokeless tobacco. She reports that she does not drink alcohol or use drugs.   Family History:  The patient's family history includes Stroke in her father.    ROS:  Please see the history of present illness.   Otherwise, review of systems are positive for none.   All other systems are reviewed and negative.    PHYSICAL EXAM: VS:  BP 138/64   Pulse 74   Ht 5\' 2"  (1.575 m)   Wt 142 lb (64.4 kg)   SpO2 96%   BMI 25.97 kg/m  , BMI Body mass index is 25.97 kg/m. GEN:  Well nourished, well developed, in no acute distress  HEENT: normal  Neck: no JVD, carotid bruits, or masses Cardiac: RRR; no  rubs, or gallops.  2/ 6 crescendo decrescendo systolic murmur in the aortic area which is early peaking. there is moderate to severe bilateral leg edema with stasis dermatitis worse on the right side.  Respiratory:  clear to auscultation bilaterally, normal work of breathing GI: soft, nontender, nondistended, + BS MS: no deformity or atrophy  Skin: warm and dry, no rash Neuro:  Strength and sensation are intact Psych: euthymic mood, full affect Vascular: Posterior tibial pulses palpable bilaterally.   EKG:  EKG is ordered today. The ekg ordered today demonstrates sinus rhythm with PACs   Recent Labs: No results found for requested labs within last 8760 hours.    Lipid Panel No results found for: CHOL, TRIG, HDL, CHOLHDL, VLDL, LDLCALC, LDLDIRECT    Wt Readings from Last 3 Encounters:  01/01/20 142 lb (64.4 kg)  12/13/19 147 lb (66.7 kg)  05/15/19 152 lb (68.9 kg)        No flowsheet data found.    ASSESSMENT AND PLAN:   1.  Chronic venous insufficiency with secondary lymphedema and recurrent venous wounds: This continues to be a major issue for the patient especially on the right side.  She has failed conservative therapy including leg elevation and knee-high support stockings which she has been faithful about using on a daily basis over the last 10 to 12 months.  In addition, she continues to have recurrent superficial blisters and wounds due to this.  Due to that, I recommend treatment with a lymphedema pump.  2. Aortic stenosis: This was mild on echocardiogram from June 2020.  Recommend repeat echocardiogram in June 2022  3. Essential hypertension: Blood pressure is controlled.  Continue losartan.    Disposition:  FU with me in 12 months  Signed,  Lorine Bears, MD  01/04/2020 10:40 AM    [facility]

## 2020-01-03 ENCOUNTER — Ambulatory Visit: Payer: Medicare Other | Attending: Internal Medicine

## 2020-01-03 DIAGNOSIS — Z23 Encounter for immunization: Secondary | ICD-10-CM | POA: Insufficient documentation

## 2020-01-03 NOTE — Progress Notes (Signed)
**Note De-Identified Isabel Obfuscation**    Covid-19 Vaccination Clinic  Name:  Isabel Barnes    MRN: [id number] DOB: 12-16-1940  01/03/2020  Isabel Barnes was observed post Covid-19 immunization for 15 minutes without incidence. She was provided with Vaccine Information Sheet and instruction to access the V-Safe system.   Isabel Barnes was instructed to call 911 with any severe reactions post vaccine: Marland Kitchen Difficulty breathing  . Swelling of your face and throat  . A fast heartbeat  . A bad rash all over your body  . Dizziness and weakness    Immunizations Administered    Name Date Dose VIS Date Route   Pfizer COVID-19 Vaccine 01/03/2020  2:39 PM 0.3 mL 11/09/2019 Intramuscular   Manufacturer: [employer], [employer]   Lot: [id number]   NDC: [address]-9

## 2020-01-08 ENCOUNTER — Telehealth: Payer: Self-pay | Admitting: *Deleted

## 2020-01-08 NOTE — Telephone Encounter (Signed)
Lymphedema prescription order has been faxed.

## 2020-01-17 ENCOUNTER — Telehealth: Payer: Self-pay | Admitting: Cardiovascular Disease

## 2020-01-17 NOTE — Telephone Encounter (Signed)
The office is closed due to weather. Will check when we are back in the office.

## 2020-01-17 NOTE — Telephone Encounter (Signed)
Please call to verify that fax was received for request for records for compression pump

## 2020-01-21 NOTE — Telephone Encounter (Signed)
Left a message that the fax has not been received.

## 2020-01-24 NOTE — Telephone Encounter (Signed)
Left a message to call back.

## 2020-01-25 NOTE — Telephone Encounter (Signed)
Fax has been received. Changes will be made when the provider is back in the office next week.

## 2020-01-25 NOTE — Telephone Encounter (Signed)
Left a message for the patient to call back.  

## 2020-01-31 ENCOUNTER — Telehealth: Payer: Self-pay | Admitting: Cardiovascular Disease

## 2020-01-31 NOTE — Telephone Encounter (Signed)
Duplicate encounter

## 2020-01-31 NOTE — Telephone Encounter (Signed)
Please call regarding medical equipment order that was faxed

## 2020-02-01 NOTE — Telephone Encounter (Signed)
Left a message with the number provided to call back.

## 2020-02-12 NOTE — Telephone Encounter (Signed)
The updated forms have been faxed.

## 2020-04-24 ENCOUNTER — Encounter (HOSPITAL_COMMUNITY): Payer: Self-pay | Admitting: Emergency Medicine

## 2020-04-24 ENCOUNTER — Emergency Department (HOSPITAL_COMMUNITY)
Admission: EM | Admit: 2020-04-24 | Discharge: 2020-04-25 | Disposition: A | Discharge status: Home / Self Care | Payer: Medicare Other | Attending: Emergency Medicine | Admitting: Emergency Medicine

## 2020-04-24 DIAGNOSIS — E039 Hypothyroidism, unspecified: Secondary | ICD-10-CM | POA: Insufficient documentation

## 2020-04-24 DIAGNOSIS — R519 Headache, unspecified: Secondary | ICD-10-CM | POA: Diagnosis not present

## 2020-04-24 DIAGNOSIS — Z79899 Other long term (current) drug therapy: Secondary | ICD-10-CM | POA: Insufficient documentation

## 2020-04-24 DIAGNOSIS — Z91040 Latex allergy status: Secondary | ICD-10-CM | POA: Insufficient documentation

## 2020-04-24 DIAGNOSIS — Z87891 Personal history of nicotine dependence: Secondary | ICD-10-CM | POA: Diagnosis not present

## 2020-04-24 DIAGNOSIS — I10 Essential (primary) hypertension: Secondary | ICD-10-CM | POA: Diagnosis not present

## 2020-04-24 DIAGNOSIS — Z7982 Long term (current) use of aspirin: Secondary | ICD-10-CM | POA: Insufficient documentation

## 2020-04-24 DIAGNOSIS — Z96651 Presence of right artificial knee joint: Secondary | ICD-10-CM | POA: Insufficient documentation

## 2020-04-24 NOTE — ED Triage Notes (Signed)
Per pt, states pain on right side of head for 3 days-here for a CT of the head

## 2020-04-24 NOTE — ED Provider Notes (Signed)
[facility]-EMERGENCY DEPT Provider Note   CSN: [id number] Arrival date & time: 04/24/20  1238     History Chief Complaint  Patient presents with  . Headache    Isabel Barnes is a 79 y.o. female with a history of anemia, GERD, hypertension, hypothyroidism, prior cervical spine fusion, psoriatic arthritis who presents to the emergency department with complaints of headache, sent from urgent care for CT imaging.  Patient states that she has been having an intermittent headache to the left side of her head since yesterday, states it is a stabbing/sharp pain, last a few minutes at a time, initially was occurring every 30 minutes, improved today is having several hours between episodes, states today her head more just feels sore.  He has had some relief with application of ice.  She was seen in urgent care and told she had the CT scan of her head.  She has not similar headaches.  She denies numbness, tingling, weakness, visual disturbance, vomiting, fever, or chills.  She does have a history of chronic pain and takes hydrocodone and Robaxin for this.  Denies recent head trauma.  HPI     Past Medical History:  Diagnosis Date  . Anemia    iron deficiency hx.has had iron infusions before   . Chronic low back pain   . Chronic pain disorder   . Complication of anesthesia    severe claustrophobia  . Constipation    r/t use of pain meds.Takes OTC meds or eats prunes  . GERD (gastroesophageal reflux disease)    takes Omeprazole daily  . Heart murmur    "slight"  . History of bronchitis    20+ yrs ago  . History of kidney stones   . History of prolapse of bladder   . History of shingles   . Hypertension    takes Losartan daily  . Hypothyroidism    takes Synthroid daily  . Joint swelling   . Neck pain    bone spurs at base of head per pt  . Osteoarthritis    lumbar,cervical,joints  . Pneumonia    hx of > 20 yrs ago  . Shortness of breath    occasionally and  with exertion. Albuterol inhaler as needed  . Spinal headache 1991   blood patch placed  . Spondylitis (HCC)   . Unsteady gait    occasionally  . Urinary urgency     Patient Active Problem List   Diagnosis Date Noted  . Short-term memory loss 12/13/2019  . 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 Chronic pain disorder   . Osteoarthritis   . Incisional umbilical hernia, without obstruction or gangrene   . Iron deficiency anemia 09/19/2012    Past Surgical History:  Procedure Laterality Date  . ABDOMINAL HYSTERECTOMY    . ANTERIOR FUSION CERVICAL SPINE     x2 -C4-7  . BUNIONECTOMY Bilateral   . COLONOSCOPY    . CYSTOSCOPY W/ URETEROSCOPY  2012  . EYE SURGERY Bilateral    cataract /lens implant  . HOLMIUM LASER APPLICATION Left 02/08/2013   Procedure: HOLMIUM LASER APPLICATION;  Surgeon: Anner Crete, MD;  Location: [facility];  Service: Urology;  Laterality: Left;  . INSERTION OF MESH N/A 07/15/2014   Procedure: INSERTION OF MESH;  Surgeon: Ardeth Sportsman, MD;  Location: [facility] OR;  Service: General;  Laterality: N/A;  . JOINT REPLACEMENT Right 2012  shoulder  . LAPAROSCOPIC CHOLECYSTECTOMY W/ CHOLANGIOGRAPHY  2012   Dr Ninfa Linden  . NASAL SINUS SURGERY    . RADIOLOGY WITH ANESTHESIA N/A 05/09/2014   Procedure: ADULT SEDATION WITH ANESTHESIA/MRI CERVICAL SPINE WITHOUT CONTRAST;  Surgeon: Medication Radiologist, MD;  Location: [place];  Service: Radiology;  Laterality: N/A;  DR. HAWKS/MRI  . right knee arthroscopy     d/t meniscal tear  . SHOULDER ARTHROSCOPY W/ ROTATOR CUFF REPAIR Bilateral three times each over several yrs  . THUMB ARTHROSCOPY Left   . 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   Procedure: RIGHT TOTAL KNEE ARTHROPLASTY;  Surgeon: Dorna Leitz, MD;  Location: [place];  Service: Orthopedics;  Laterality: Right;  . UMBILICAL HERNIA REPAIR N/A 07/15/2014   Procedure: LAPAROSCOPIC  UMBILICAL AND INFRAUMBILICAL HERNIA;  Surgeon: Adin Hector, MD;  Location: [place];  Service: General;  Laterality: N/A;     OB History   No obstetric history on file.     Family History  Problem Relation Age of Onset  . Stroke Father     Social History   Tobacco Use  . Smoking status: Former Smoker    Packs/day: 0.50    Years: 2.00    Pack years: 1.00    Types: Cigarettes    Quit date: 02/07/1992    Years since quitting: 28.2  . Smokeless tobacco: Never Used  Substance Use Topics  . Alcohol use: No  . Drug use: No    Home Medications Prior to Admission medications   Medication Sig Start Date End Date Taking? Authorizing Provider  albuterol (PROAIR HFA) 108 (90 BASE) MCG/ACT inhaler Inhale 2 puffs into the lungs every 4 (four) hours as needed for shortness of breath.     [provider]  aspirin EC 81 MG tablet Take 81 mg by mouth daily.    [provider]  calcium-vitamin D (OSCAL WITH D) 500-200 MG-UNIT per tablet Take 1 tablet by mouth every morning.     [provider]  carboxymethylcellulose (EQ RESTORE TEARS) 0.5 % SOLN Place 1 drop into both eyes daily.    [provider]  cholecalciferol (VITAMIN D) 1000 UNITS tablet Take 1,000 Units by mouth daily.     [provider]  diclofenac sodium (VOLTAREN) 1 % GEL Apply 2 g topically 2 (two) times daily as needed (For pain.).     [provider]  Digestive Enzymes (ENZYME DIGEST PO) Take 1 tablet by mouth daily.     [provider]  diphenhydrAMINE (BENADRYL) 25 MG tablet Take 25 mg by mouth every 6 (six) hours as needed for allergies.     [provider]  ENSURE PLUS (ENSURE PLUS) LIQD Take 237 mLs by mouth daily as needed.     [provider]  ezetimibe (ZETIA) 10 MG tablet Take 10 mg by mouth daily.    [provider]  fish oil-omega-3 fatty acids 1000 MG capsule Take 1,000 mg by mouth 2 (two) times daily.     [provider]  furosemide (LASIX) 20 MG tablet Take 20 mg by mouth daily.    [provider]  HYDROcodone-acetaminophen (NORCO) 10-325 MG tablet Take 1-2 tablets by mouth every 6 (six) hours as needed for severe pain. 07/16/16   Gary Fleet, PA-C  levothyroxine (SYNTHROID, LEVOTHROID) 50 MCG tablet Take 50 mcg by mouth every morning.     [provider]  loperamide (IMODIUM A-D) 2 MG tablet Take 4 mg by mouth as needed for diarrhea  or loose stools.     [provider]  losartan (COZAAR) 25 MG tablet Take 25 mg by mouth daily.     [provider]  magnesium 30 MG tablet Take 30 mg by mouth daily.     [provider]  methocarbamol (ROBAXIN) 500 MG tablet Take 500 mg by mouth 3 (three) times daily as needed. 12/03/19   [provider]  Multiple Vitamins-Minerals (MULTIVITAMIN PO) Take 1 tablet by mouth daily.    [provider]  omeprazole (PRILOSEC) 20 MG capsule Take 20 mg by mouth daily.     [provider]  ondansetron (ZOFRAN) 8 MG tablet Take 1 tablet (8 mg total) by mouth every 8 (eight) hours as needed for nausea or vomiting. 06/25/17   Mancel Bale, MD  POTASSIUM PO Take 1 tablet by mouth daily.    [provider]  predniSONE (DELTASONE) 5 MG tablet Take 5 mg by mouth daily. 12/03/19   [provider]  Probiotic Product (PROBIOTIC DAILY PO) Take 1 capsule by mouth daily.     [provider]  prochlorperazine (COMPAZINE) 10 MG tablet Take 5 mg by mouth every 8 (eight) hours as needed (For nausea.).     [provider]  RESTASIS 0.05 % ophthalmic emulsion 1 drop 2 (two) times daily. 09/18/19   [provider]  triamcinolone (NASACORT) 55 MCG/ACT AERO nasal inhaler Place into the nose. 07/14/17   [provider]  Turmeric 500 MG CAPS Take 500 mg by mouth at bedtime.     [provider]  vitamin B-12 (CYANOCOBALAMIN) 250 MCG tablet Take 250 mcg by mouth at bedtime.      [provider]    Allergies    Dilaudid [hydromorphone hcl], Gabapentin, Lyrica [pregabalin], Oxycodone, Singulair [montelukast sodium], Banana, Ciprofloxacin hcl, Codeine, Methadone, Metronidazole, Oysters [shellfish allergy], Clindamycin/lincomycin, Sulfa antibiotics, Betadine [povidone iodine], Latex, Nucynta [tapentadol], Other, Penicillins, and Sulfur  Review of Systems   Review of Systems  Constitutional: Negative for chills and fever.  Eyes: Negative for visual disturbance.  Respiratory: Negative for shortness of breath.   Cardiovascular: Negative for chest pain.  Gastrointestinal: Negative for abdominal pain and vomiting.  Musculoskeletal: Positive for neck pain.  Neurological: Positive for headaches. Negative for dizziness, seizures, syncope, facial asymmetry, speech difficulty, weakness and numbness.  All other systems reviewed and are negative.   Physical Exam Updated Vital Signs BP (!) 146/62 (BP Location: Left Arm)   Pulse 87   Temp 98.9 F (37.2 C) (Oral)   Resp 16   SpO2 100%   Physical Exam Vitals and nursing note reviewed.  Constitutional:      General: She is not in acute distress.    Appearance: She is well-developed. She is not toxic-appearing.  HENT:     Head: Normocephalic and atraumatic.     Comments: No tenderness over the temporal artery. Eyes:     General: Vision grossly intact. Gaze aligned appropriately.        Right eye: No discharge.        Left eye: No discharge.     Extraocular Movements: Extraocular movements intact.     Conjunctiva/sclera: Conjunctivae normal.     Comments: PERRL. No proptosis.   Neck:     Comments: Diffuse tenderness to the midline and bilateral paraspinal muscles of the cervical spine.  Patient states this is her chronic pain.  No overlying rashes. Cardiovascular:     Rate and Rhythm: Normal rate and regular rhythm.  Pulmonary:  Effort: Pulmonary effort is normal. No respiratory distress.     Breath  sounds: Normal breath sounds. No wheezing, rhonchi or rales.  Abdominal:     General: There is no distension.     Palpations: Abdomen is soft.     Tenderness: There is no abdominal tenderness.  Musculoskeletal:     Cervical back: Normal range of motion and neck supple. No rigidity.  Skin:    General: Skin is warm and dry.     Findings: No rash.  Neurological:     Comments: Alert. Clear speech. No facial droop. CNIII-XII grossly intact. Bilateral upper and lower extremities' sensation grossly intact. 5/5 symmetric strength with grip strength and with plantar and dorsi flexion bilaterally . Normal finger to nose bilaterally. Negative pronator drift. Negative Romberg sign. Gait is steady and intact with her cane that she uses at baseline.  Psychiatric:        Behavior: Behavior normal.    ED Results / Procedures / Treatments   Labs (all labs ordered are listed, but only abnormal results are displayed) Labs Reviewed - No data to display  EKG None  Radiology No results found.  Procedures Procedures (including critical care time)  Medications Ordered in ED Medications - No data to display  ED Course  I have reviewed the triage vital signs and the nursing notes.  Pertinent labs & imaging results that were available during my care of the patient were reviewed by me and considered in my medical decision making (see chart for details).    MDM Rules/Calculators/A&P                     Patient presents to the emergency department with complaints of headache.  She is nontoxic, resting comfortably, vitals with mildly elevated blood pressure.  She has a fairly benign physical exam.  She is afebrile, no nuchal rigidity, low suspicion for meningitis.  She has no overlying rash to raise concern for shingles. No visual disturbance, proptosis, or tenderness to temporal artery to suggest acute angle closure glaucoma, dural venous sinus thrombosis, or temporal arteritis.  CT head and cervical  spine imaging was obtained, no acute intracranial normality noted, she does have atrophy and chronic small vessel ischemia on CT head as well as degenerative changes throughout the cervical spine as detailed above.  No signs of bleeding on CT imaging.  She has no focal neurologic deficits to raise concern for CVA.  Unclear definitive etiology of headache, however this seems to be improving.  She overall appears appropriate for discharge home with primary care follow-up, may benefit from outpatient MRI, Dr. Dalene Seltzer has provided prescription for compazine. We discussed results, treatment plan, need for follow-up, and return precautions with the patient. Provided opportunity for questions, patient confirmed understanding and is in agreement with plan.   This is a shared visit with supervising physician Dr. Dalene Seltzer who has independently evaluated patient & provided guidance in evaluation/management/disposition, in agreement with care   Final Clinical Impression(s) / ED Diagnoses Final diagnoses:  Acute nonintractable headache, unspecified headache type    Rx / DC Orders ED Discharge Orders         Ordered    prochlorperazine (COMPAZINE) 10 MG tablet  2 times daily PRN     04/25/20 0123           Chauntay Paszkiewicz, Pleas Koch, PA-C 04/25/20 0133    Alvira Monday, MD 04/25/20 1515

## 2020-04-25 ENCOUNTER — Emergency Department (HOSPITAL_COMMUNITY): Payer: Medicare Other

## 2020-04-25 MED ORDER — PROCHLORPERAZINE MALEATE 10 MG PO TABS
10.0000 mg | ORAL_TABLET | Freq: Two times a day (BID) | ORAL | 0 refills | Status: DC | PRN
Start: 2020-04-25 — End: 2022-02-15

## 2020-04-25 MED ORDER — PROCHLORPERAZINE MALEATE 10 MG PO TABS
10.0000 mg | ORAL_TABLET | Freq: Once | ORAL | Status: AC
  Administered 2020-04-25: 10 mg via ORAL
  Filled 2020-04-25: qty 1

## 2020-04-25 MED ORDER — DEXAMETHASONE 4 MG PO TABS
10.0000 mg | ORAL_TABLET | Freq: Once | ORAL | Status: AC
  Administered 2020-04-25: 10 mg via ORAL
  Filled 2020-04-25: qty 2

## 2020-04-25 MED ORDER — DIPHENHYDRAMINE HCL 25 MG PO CAPS
25.0000 mg | ORAL_CAPSULE | Freq: Once | ORAL | Status: AC
  Administered 2020-04-25: 25 mg via ORAL
  Filled 2020-04-25: qty 1

## 2020-04-25 NOTE — Discharge Instructions (Addendum)
You were seen in the emergency department today for headache.  The CT scan of your brain did not show any acute abnormalities to account for your symptoms.  The CT of your neck did show some degenerative changes throughout your spine.  Please continue to take your chronic pain medications.  If I seems to be helping with the discomfort please continue to apply this 20 minutes on 40 minutes off wrapped in a towel.   We would like you to follow-up with your primary care provider within 3 days for reevaluation and for potential MRI if you are still having this discomfort.  Return to the ER for new or worsening symptoms including but not limited to worsening pain, persistent pain, change in your vision, numbness, weakness, dizziness, passing out, vomiting, fever, or any other concerns.

## 2020-07-10 ENCOUNTER — Encounter (HOSPITAL_BASED_OUTPATIENT_CLINIC_OR_DEPARTMENT_OTHER): Payer: Medicare Other | Attending: Internal Medicine | Admitting: Internal Medicine

## 2020-07-10 DIAGNOSIS — F419 Anxiety disorder, unspecified: Secondary | ICD-10-CM | POA: Diagnosis not present

## 2020-07-10 DIAGNOSIS — L97222 Non-pressure chronic ulcer of left calf with fat layer exposed: Secondary | ICD-10-CM | POA: Insufficient documentation

## 2020-07-10 DIAGNOSIS — I5020 Unspecified systolic (congestive) heart failure: Secondary | ICD-10-CM | POA: Diagnosis not present

## 2020-07-10 DIAGNOSIS — M1990 Unspecified osteoarthritis, unspecified site: Secondary | ICD-10-CM | POA: Insufficient documentation

## 2020-07-10 DIAGNOSIS — Z881 Allergy status to other antibiotic agents status: Secondary | ICD-10-CM | POA: Insufficient documentation

## 2020-07-10 DIAGNOSIS — L97211 Non-pressure chronic ulcer of right calf limited to breakdown of skin: Secondary | ICD-10-CM | POA: Diagnosis not present

## 2020-07-10 DIAGNOSIS — Z888 Allergy status to other drugs, medicaments and biological substances status: Secondary | ICD-10-CM | POA: Insufficient documentation

## 2020-07-10 DIAGNOSIS — I110 Hypertensive heart disease with heart failure: Secondary | ICD-10-CM | POA: Diagnosis not present

## 2020-07-10 DIAGNOSIS — Z885 Allergy status to narcotic agent status: Secondary | ICD-10-CM | POA: Insufficient documentation

## 2020-07-10 DIAGNOSIS — Z96651 Presence of right artificial knee joint: Secondary | ICD-10-CM | POA: Diagnosis not present

## 2020-07-10 DIAGNOSIS — I872 Venous insufficiency (chronic) (peripheral): Secondary | ICD-10-CM | POA: Insufficient documentation

## 2020-07-10 DIAGNOSIS — Z8672 Personal history of thrombophlebitis: Secondary | ICD-10-CM | POA: Diagnosis not present

## 2020-07-10 DIAGNOSIS — Z87891 Personal history of nicotine dependence: Secondary | ICD-10-CM | POA: Diagnosis not present

## 2020-07-10 DIAGNOSIS — Z96611 Presence of right artificial shoulder joint: Secondary | ICD-10-CM | POA: Diagnosis not present

## 2020-07-10 DIAGNOSIS — Z8249 Family history of ischemic heart disease and other diseases of the circulatory system: Secondary | ICD-10-CM | POA: Diagnosis not present

## 2020-07-10 DIAGNOSIS — D649 Anemia, unspecified: Secondary | ICD-10-CM | POA: Diagnosis not present

## 2020-07-10 DIAGNOSIS — Z880 Allergy status to penicillin: Secondary | ICD-10-CM | POA: Diagnosis not present

## 2020-07-10 DIAGNOSIS — I890 Lymphedema, not elsewhere classified: Secondary | ICD-10-CM | POA: Insufficient documentation

## 2020-07-10 DIAGNOSIS — Z882 Allergy status to sulfonamides status: Secondary | ICD-10-CM | POA: Diagnosis not present

## 2020-07-10 DIAGNOSIS — Z91040 Latex allergy status: Secondary | ICD-10-CM | POA: Insufficient documentation

## 2020-07-10 NOTE — Progress Notes (Signed)
Isabel Barnes, Isabel Barnes ([id number]) Visit Report for 07/10/2020 Abuse/Suicide Risk Screen Details Patient Name: Date of Service: Isabel Barnes, [name] 07/10/2020 1:15 PM Medical Record Number: [id number] Patient Account Number: [id number] Date of Birth/Sex: Treating RN: Isabel Barnes (79 y.o. Isabel Barnes Primary Care Isabel Barnes: HEDGECO CK, SUZA NNE Other Clinician: Referring Isabel Barnes: Treating Isabel Barnes/Extender: Isabel Barnes HEDGECO CK, SUZA NNE Weeks in Treatment: 0 Abuse/Suicide Risk Screen Items Answer ABUSE RISK SCREEN: Has anyone close to you tried to hurt or harm you recentlyo No Do you feel uncomfortable with anyone in your familyo No Has anyone forced you do things that you didnt want to doo No Electronic Signature(s) Signed: 07/10/2020 5:05:13 PM By: Isabel Barnes Entered By: Isabel Barnes on 07/10/2020 14:15:29 -------------------------------------------------------------------------------- Activities of Daily Living Details Patient Name: Date of Service: Isabel Barnes, [name] 07/10/2020 1:15 PM Medical Record Number: [id number] Patient Account Number: [id number] Date of Birth/Sex: Treating RN: Isabel Barnes (79 y.o. Isabel Barnes Primary Care Nyriah Coote: HEDGECO CK, SUZA NNE Other Clinician: Referring Isabel Barnes: Treating Isabel Barnes/Extender: Isabel Barnes HEDGECO CK, SUZA NNE Weeks in Treatment: 0 Activities of Daily Living Items Answer Activities of Daily Living (Please select one for each item) Drive Automobile Completely Able T Medications ake Completely Able Use T elephone Completely Able Care for Appearance Completely Able Use T oilet Completely Able Bath / Shower Completely Able Dress Self Completely Able Feed Self Completely Able Walk Completely Able Get In / Out Bed Completely Able Housework Completely Able Prepare Meals Completely Able Handle Money Completely Able Shop for Self Completely Able Electronic Signature(s) Signed: 07/10/2020  5:05:13 PM By: Isabel Barnes Entered By: Isabel Barnes on 07/10/2020 14:15:55 -------------------------------------------------------------------------------- Education Screening Details Patient Name: Date of Service: Isabel Lopes, PA Isabel W. 07/10/2020 1:15 PM Medical Record Number: [id number] Patient Account Number: [id number] Date of Birth/Sex: Treating RN: [birth year] Barnes (79 y.o. Isabel Barnes Primary Care Latasha Puskas: HEDGECO CK, SUZA NNE Other Clinician: Referring Isabel Barnes: Treating Isabel Barnes/Extender: Isabel Barnes HEDGECO CK, SUZA NNE Weeks in Treatment: 0 Primary Learner Assessed: Patient Learning Preferences/Education Level/Primary Language Learning Preference: Explanation Preferred Language: English Cognitive Barrier Language Barrier: No Translator Needed: No Memory Deficit: No Emotional Barrier: No Cultural/Religious Beliefs Affecting Medical Care: No Physical Barrier Impaired Vision: No Impaired Hearing: No Decreased Hand dexterity: No Knowledge/Comprehension Knowledge Level: High Comprehension Level: High Ability to understand written instructions: High Ability to understand verbal instructions: High Motivation Anxiety Level: Calm Cooperation: Cooperative Education Importance: Acknowledges Need Interest in Health Problems: Asks Questions Perception: Coherent Willingness to Engage in Self-Management High Activities: Readiness to Engage in Self-Management High Activities: Electronic Signature(s) Signed: 07/10/2020 5:05:13 PM By: Isabel Barnes Entered By: Isabel Barnes on 07/10/2020 14:16:13 -------------------------------------------------------------------------------- Fall Risk Assessment Details Patient Name: Date of Service: Isabel RTER, PA Isabel W. 07/10/2020 1:15 PM Medical Record Number: [id number] Patient Account Number: [id number] Date of Birth/Sex: Treating RN: [birth year] Barnes (79 y.o. Isabel Barnes Primary Care Isabel Barnes: HEDGECO CK,  SUZA NNE Other Clinician: Referring Isabel Barnes: Treating Isabel Barnes/Extender: Isabel Barnes HEDGECO CK, SUZA NNE Weeks in Treatment: 0 Fall Risk Assessment Items Have you had 2 or more falls in the last 12 monthso 0 Yes Have you had any fall that resulted in injury in the last 12 monthso 0 No FALLS RISK SCREEN History of falling - immediate or within 3 months 25 Yes Secondary diagnosis (Do you have 2 or more medical diagnoseso) 15 Yes Ambulatory aid None/bed rest/wheelchair/nurse 0 No Crutches/cane/walker 15 Yes Furniture 0 No Intravenous therapy Access/Saline/Heparin Lock 0 No Gait/Transferring Normal/ bed rest/ wheelchair  0 No Weak (short steps with or without shuffle, stooped but able to lift head while walking, may seek 10 Yes support from furniture) Impaired (short steps with shuffle, may have difficulty arising from chair, head down, impaired 0 No balance) Mental Status Oriented to own ability 0 Yes Electronic Signature(s) Signed: 07/10/2020 5:05:13 PM By: Isabel Barnes Entered By: Isabel Barnes on 07/10/2020 14:16:36 -------------------------------------------------------------------------------- Foot Assessment Details Patient Name: Date of Service: Isabel Lopes, PA Isabel W. 07/10/2020 1:15 PM Medical Record Number: [id number] Patient Account Number: [id number] Date of Birth/Sex: Treating RN: [birth year]es (79 y.o. Isabel Barnes Primary Care Isabel Barnes: HEDGECO CK, SUZA NNE Other Clinician: Referring Isabel Barnes: Treating Isabel Barnes/Extender: Isabel Barnes HEDGECO CK, SUZA NNE Weeks in Treatment: 0 Foot Assessment Items Site Locations + = Sensation present, - = Sensation absent, C = Callus, U = Ulcer R = Redness, W = Warmth, M = Maceration, PU = Pre-ulcerative lesion F = Fissure, S = Swelling, D = Dryness Assessment Right: Left: Other Deformity: No No Prior Foot Ulcer: No No Prior Amputation: No No Charcot Joint: No No Ambulatory Status: Ambulatory With  Help Assistance Device: Cane Gait: Steady Electronic Signature(s) Signed: 07/10/2020 5:05:13 PM By: Isabel Barnes Entered By: Isabel Barnes on 07/10/2020 14:17:08 -------------------------------------------------------------------------------- Nutrition Risk Screening Details Patient Name: Date of Service: Isabel Barnes, [name] 07/10/2020 1:15 PM Medical Record Number: [id number] Patient Account Number: [id number] Date of Birth/Sex: Treating RN: [birth year] (79 y.o. Isabel Barnes Primary Care Aum Caggiano: HEDGECO CK, SUZA NNE Other Clinician: Referring Harlowe Dowler: Treating Nataline Basara/Extender: Isabel Barnes HEDGECO CK, SUZA NNE Weeks in Treatment: 0 Height (in): 62 Weight (lbs): 135 Body Mass Index (BMI): 24.7 Nutrition Risk Screening Items Score Screening NUTRITION RISK SCREEN: I have an illness or condition that made me change the kind and/or amount of food I eat 0 No I eat fewer than two meals per day 0 No I eat few fruits and vegetables, or milk products 0 No I have three or more drinks of beer, liquor or wine almost every day 0 No I have tooth or mouth problems that make it hard for me to eat 0 No I don't always have enough money to buy the food I need 0 No I eat alone most of the time 0 No I take three or more different prescribed or over-the-counter drugs a day 1 Yes Without wanting to, I have lost or gained 10 pounds in the last six months 0 No I am not always physically able to shop, cook and/or feed myself 0 No Nutrition Protocols Good Risk Protocol 0 No interventions needed Moderate Risk Protocol High Risk Proctocol Risk Level: Good Risk Score: 1 Electronic Signature(s) Signed: 07/10/2020 5:05:13 PM By: Isabel Barnes Entered By: Isabel Barnes on 07/10/2020 14:16:54

## 2020-07-11 NOTE — Progress Notes (Signed)
Isabel Barnes, Isabel Barnes ([id number]) Visit Report for 07/10/2020 Allergy List Details Patient Name: Date of Service: PO [place], [name] 07/10/2020 1:15 PM Medical Record Number: [id number] Patient Account Number: [id number] Date of Birth/Sex: Treating RN: 10-12-1941 (79 y.o. [name] Primary Care Marysue Fait: HEDGECO CK, SUZA NNE Other Clinician: Referring Jaydrien Wassenaar: Treating Aseneth Hack/Extender: Cassandria Anger HEDGECO CK, SUZA NNE Weeks in Treatment: 0 Allergies Active Allergies gabapentin Reaction: GI upset hydromorphone Reaction: respiratory arrest montelukast oxycodone Reaction: severe sleepiness Lyrica Reaction: GI upset banana Reaction: GI upset ciprofloxacin Reaction: nausea/diarrhea codeine Reaction: hallucinations methadone Reaction: dizziness metronidazole Reaction: GI upset Shellfish Containing Products Reaction: GI upset, rash iodine Reaction: rash cefuroxime Reaction: GI upset oyster extract Reaction: rash Sulfa (Sulfonamide Antibiotics) Reaction: GI upset sulfasalazine Reaction: GI upset latex Reaction: rash Severity: Moderate lincomycin Reaction: GI upset penicillin Reaction: rash Severity: Severe tapentadol Allergy Notes Electronic Signature(s) Signed: 07/10/2020 5:05:13 PM By: Cherylin Mylar Entered By: Cherylin Mylar on 07/10/2020 14:13:43 -------------------------------------------------------------------------------- Arrival Information Details Patient Name: Date of Service: [name], [name] 07/10/2020 1:15 PM Medical Record Number: [id number] Patient Account Number: [id number] Date of Birth/Sex: Treating RN: [birth year] (79 y.o. [name] Primary Care Eshal Propps: HEDGECO CK, SUZA NNE Other Clinician: Referring Riyansh Gerstner: Treating Mac Dowdell/Extender: Cassandria Anger HEDGECO CK, SUZA NNE Weeks in Treatment: 0 Visit Information Patient Arrived: Cane Arrival Time: 14:06 Accompanied By: self Transfer Assistance:  None Patient Identification Verified: Yes Secondary Verification Process Completed: Yes Patient Has Alerts: Yes Patient Alerts: Patient on Blood Thinner Left ABI: 0.96 Right ABI: 1.17 History Since Last Visit Electronic Signature(s) Signed: 07/10/2020 5:05:13 PM By: Cherylin Mylar Entered By: Cherylin Mylar on 07/10/2020 14:54:01 -------------------------------------------------------------------------------- Clinic Level of Care Assessment Details Patient Name: Date of Service: PO [place], [name] 07/10/2020 1:15 PM Medical Record Number: [id number] Patient Account Number: [id number] Date of Birth/Sex: Treating RN: [birth year] (79 y.o. [name] Primary Care Suzan Manon: HEDGECO CK, SUZA NNE Other Clinician: Referring Shyne Resch: Treating Ryer Asato/Extender: Cassandria Anger HEDGECO CK, SUZA NNE Weeks in Treatment: 0 Clinic Level of Care Assessment Items TOOL 1 Quantity Score X- 1 0 Use when EandM and Procedure is performed on INITIAL visit ASSESSMENTS - Nursing Assessment / Reassessment X- 1 20 General Physical Exam (combine w/ comprehensive assessment (listed just below) when performed on new pt. evals) X- 1 25 Comprehensive Assessment (HX, ROS, Risk Assessments, Wounds Hx, etc.) ASSESSMENTS - Wound and Skin Assessment / Reassessment []  - 0 Dermatologic / Skin Assessment (not related to wound area) ASSESSMENTS - Ostomy and/or Continence Assessment and Care []  - 0 Incontinence Assessment and Management []  - 0 Ostomy Care Assessment and Management (repouching, etc.) PROCESS - Coordination of Care X - Simple Patient / Family Education for ongoing care 1 15 []  - 0 Complex (extensive) Patient / Family Education for ongoing care X- 1 10 Staff obtains , Records, T Results / Process Orders est []  - 0 Staff telephones HHA, Nursing Homes / Clarify orders / etc []  - 0 Routine Transfer to another Facility (non-emergent condition) []  - 0 Routine Hospital Admission  (non-emergent condition) X- 1 15 New Admissions / / Ordering NPWT Apligraf, etc. , []  - 0 Emergency Hospital Admission (emergent condition) PROCESS - Special Needs []  - 0 Pediatric / Minor Patient Management []  - 0 Isolation Patient Management []  - 0 Hearing / Language / Visual special needs []  - 0 Assessment of Community assistance (transportation, D/C planning, etc.) []  - 0 Additional assistance / Altered mentation []  - 0 Support Surface(s) Assessment (bed,  cushion, seat, etc.) INTERVENTIONS - Miscellaneous []  - 0 External ear exam []  - 0 Patient Transfer (multiple staff / / Similar devices) []  - 0 Simple Staple / Suture removal (25 or less) []  - 0 Complex Staple / Suture removal (26 or more) []  - 0 Hypo/Hyperglycemic Management (do not check if billed separately) X- 1 15 Ankle / Brachial Index (ABI) - do not check if billed separately Has the patient been seen at the hospital within the last three years: Yes Total Score: 100 Level Of Care: New/Established - Level 3 Electronic Signature(s) Signed: 07/11/2020 5:19:54 PM By: Nurse, adult RN, BSN Entered By: on 07/10/2020 15:26:30 -------------------------------------------------------------------------------- Compression Therapy Details Patient Name: Date of Service: , [name] 07/10/2020 1:15 PM Medical Record Number: Zandra Abts Patient Account Number: Zandra Abts Date of Birth/Sex: Treating RN: 1941/06/01 (79 y.o. 09/09/2020 Primary Care Henson Fraticelli: HEDGECO CK, SUZA NNE Other Clinician: Referring Mads Borgmeyer: Treating Hydeia Mcatee/Extender: [id number] HEDGECO CK, SUZA NNE Weeks in Treatment: 0 Compression Therapy Performed for Wound Assessment: Wound #2 Left,Lateral Lower Leg Performed By: Clinician [id number], RN Compression Type: Four Layer Post Procedure Diagnosis Same as Pre-procedure Electronic Signature(s) Signed: 07/11/2020 5:19:54 PM By:  96 RN, BSN Entered By: [name] on 07/10/2020 14:56:15 -------------------------------------------------------------------------------- Compression Therapy Details Patient Name: Date of Service: Zandra Abts, [name] 07/10/2020 1:15 PM Medical Record Number: Zandra Abts Patient Account Number: Zandra Abts Date of Birth/Sex: Treating RN: Nov 16, 1941 (79 y.o. 09/09/2020 Primary Care Carlia Bomkamp: HEDGECO CK, SUZA NNE Other Clinician: Referring Zyah Gomm: Treating Arjen Deringer/Extender: [id number] HEDGECO CK, SUZA NNE Weeks in Treatment: 0 Compression Therapy Performed for Wound Assessment: Wound #3 Right,Lateral Lower Leg Performed By: Clinician [id number], RN Compression Type: Four Layer Post Procedure Diagnosis Same as Pre-procedure Electronic Signature(s) Signed: 07/11/2020 5:19:54 PM By: 96 RN, BSN Entered By: [name] on 07/10/2020 14:56:15 -------------------------------------------------------------------------------- Encounter Discharge Information Details Patient Name: Date of Service: Zandra Abts, [name] 07/10/2020 1:15 PM Medical Record Number: Zandra Abts Patient Account Number: Zandra Abts Date of Birth/Sex: Treating RN: Oct 25, 1941 (79 y.o. 09/09/2020 Primary Care Kyair Ditommaso: HEDGECO CK, SUZA NNE Other Clinician: Referring Farah Benish: Treating Madisin Hasan/Extender: [id number] HEDGECO CK, SUZA NNE Weeks in Treatment: 0 Encounter Discharge Information Items Post Procedure Vitals Discharge Condition: Stable Temperature (F): 97.7 Ambulatory Status: Cane Pulse (bpm): 78 Discharge Destination: Home Respiratory Rate (breaths/min): 18 Transportation: Private Auto Blood Pressure (mmHg): 129/52 Accompanied By: self Schedule Follow-up Appointment: Yes Clinical Summary of Care: Patient Declined Electronic Signature(s) Signed: 07/11/2020 5:26:58 PM By: 10/06/1941 RN, BSN Entered By: 96 on 07/10/2020  15:18:03 -------------------------------------------------------------------------------- Lower Extremity Assessment Details Patient Name: Date of Service: PO [place], 07/13/2020 W. 07/10/2020 1:15 PM Medical Record Number: Zenaida Deed Patient Account Number: 09/09/2020 Date of Birth/Sex: Treating RN: 1941-03-13 (79 y.o. F) Dwiggins, [facility] Primary Care Damaris Geers: Other Clinician: HEDGECO CK, SUZA NNE Referring Corinn Stoltzfus: Treating Aroura Vasudevan/Extender: [id number] HEDGECO CK, SUZA NNE Weeks in Treatment: 0 Edema Assessment Assessed: [Left: No] [Right: No] E[Left: dema] [Right: :] Calf Left: Right: Point of Measurement: 32 cm From Medial Instep 41 cm 44 cm Ankle Left: Right: Point of Measurement: 13 cm From Medial Instep 23 cm 27 cm Vascular Assessment Blood Pressure: Brachial: [Left:129] Ankle: [Left:Dorsalis Pedis: 124 0.96] Electronic Signature(s) Signed: 07/10/2020 5:05:13 PM By: 10/06/1941 Entered By: 96 on 07/10/2020 14:23:15 -------------------------------------------------------------------------------- Multi-Disciplinary Care Plan Details Patient Name: Date of Service: Cassandria Anger, [name] 07/10/2020 1:15 PM Medical Record Number: Cherylin Mylar Patient  Account Number: [id number] Date of Birth/Sex: Treating RN: 1941/04/14 (79 y.o. [name] Primary Care Mohd Clemons: HEDGECO CK, SUZA NNE Other Clinician: Referring Japhet Morgenthaler: Treating Branton Einstein/Extender: Cassandria Anger HEDGECO CK, SUZA NNE Weeks in Treatment: 0 Active Inactive Abuse / Safety / Falls / Self Care Management Nursing Diagnoses: Potential for falls Potential for injury related to falls Goals: Patient will remain injury free related to falls Date Initiated: 07/10/2020 Target Resolution Date: 08/08/2020 Goal Status: Active Patient/caregiver will verbalize/demonstrate measures taken to prevent injury and/or falls Date Initiated: 07/10/2020 Target Resolution Date: 08/08/2020 Goal  Status: Active Interventions: Assess Activities of Daily Living upon admission and as needed Assess fall risk on admission and as needed Assess: immobility, friction, shearing, incontinence upon admission and as needed Assess impairment of mobility on admission and as needed per policy Assess personal safety and home safety (as indicated) on admission and as needed Assess self care needs on admission and as needed Provide education on fall prevention Provide education on personal and home safety 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 Compression as ordered Provide education on venous insufficiency Notes: Wound/Skin Impairment Nursing Diagnoses: Impaired tissue integrity Knowledge deficit related to ulceration/compromised skin integrity Goals: Patient/caregiver will verbalize understanding of skin care regimen Date Initiated: 07/10/2020 Target Resolution Date: 08/08/2020 Goal Status: Active Interventions: Assess patient/caregiver ability to obtain necessary supplies Assess patient/caregiver ability to perform ulcer/skin care regimen upon admission and as needed Assess ulceration(s) every visit Provide education on ulcer and skin care Notes: Electronic Signature(s) Signed: 07/11/2020 5:19:54 PM By: Zandra Abts RN, BSN Entered By: Zandra Abts on 07/10/2020 14:41:19 -------------------------------------------------------------------------------- Pain Assessment Details Patient Name: Date of Service: [name], [name] 07/10/2020 1:15 PM Medical Record Number: [id number] Patient Account Number: [id number] Date of Birth/Sex: Treating RN: March 02, 1941 (79 y.o. [name] Primary Care Shyasia Funches: HEDGECO CK, SUZA NNE Other Clinician: Referring Selah Klang: Treating Fifi Schindler/Extender: Cassandria Anger HEDGECO CK, SUZA NNE Weeks in Treatment: 0 Active Problems Location of Pain Severity and Description of Pain Patient Has Paino No Site Locations Pain Management and Medication Current Pain Management: Electronic Signature(s) Signed: 07/10/2020 5:05:13 PM By: Cherylin Mylar Entered By: Cherylin Mylar on 07/10/2020 14:32:07 -------------------------------------------------------------------------------- Patient/Caregiver Education Details Patient Name: Date of Service: [address], [place] Jenna Luo 8/12/2021andnbsp1:15 PM Medical Record Number: [id number] Patient Account Number: [id number] Date of Birth/Gender: Treating RN: 19-May-1941 (79 y.o. [name] Primary Care Physician: HEDGECO CK, SUZA NNE Other Clinician: Referring Physician: Treating Physician/Extender: Cassandria Anger HEDGECO CK, SUZA NNE Weeks in Treatment: 0 Education Assessment Education Provided To: Patient Education Topics Provided Wound/Skin Impairment: Methods: Explain/Verbal Responses: State content correctly Electronic Signature(s) Signed: 07/11/2020 5:19:54 PM By: Zandra Abts RN, BSN Entered By: Zandra Abts on 07/10/2020 15:26:01 -------------------------------------------------------------------------------- Wound Assessment Details Patient Name: Date of Service: [name], [name] 07/10/2020 1:15 PM Medical Record Number: [id number] Patient Account Number: [id number] Date of Birth/Sex: Treating RN: October 07, 1941 (79 y.o. [name] Primary Care Idonia Zollinger: HEDGECO CK, SUZA NNE Other Clinician: Referring Marijean Montanye: Treating Srishti Strnad/Extender: Cassandria Anger HEDGECO CK, SUZA NNE Weeks in Treatment: 0 Wound Status Wound Number: 2 Primary Trauma, Other Etiology: Wound Location: Left, Lateral Lower Leg Secondary Lymphedema Wounding Event:  Trauma Etiology: Date Acquired: 06/10/2020 Wound Open Weeks Of Treatment: 0 Status: Clustered Wound: No Comorbid Cataracts,  Chronic sinus problems/congestion, Anemia, History: Hypertension, Peripheral Venous Disease, Osteoarthritis, Confinement Anxiety Photos Photo Uploaded By: Benjaman KindlerJones, Dedrick on 07/11/2020 11:24:56 Wound Measurements Length: (cm) 3.8 Width: (cm) 4.5 Depth: (cm) 1 Area: (cm) 13.43 Volume: (cm) 13.43 % Reduction in Area: % Reduction in Volume: Epithelialization: None Tunneling: No Undermining: No Wound Description Classification: Full Thickness Without Exposed Support Structures Wound Margin: Well defined, not attached Exudate Amount: Medium Exudate Type: Serosanguineous Exudate Color: red, brown Foul Odor After Cleansing: No Slough/Fibrino Yes Wound Bed Granulation Amount: Medium (34-66%) Exposed Structure Granulation Quality: Red, Pink Fascia Exposed: No Necrotic Amount: Medium (34-66%) Fat Layer (Subcutaneous Tissue) Exposed: Yes Necrotic Quality: Adherent Slough Tendon Exposed: No Muscle Exposed: No Joint Exposed: No Bone Exposed: No Electronic Signature(s) Signed: 07/10/2020 5:05:13 PM By: Cherylin Mylarwiggins, Shannon Entered By: Cherylin Mylarwiggins, Shannon on 07/10/2020 14:30:38 -------------------------------------------------------------------------------- Wound Assessment Details Patient Name: Date of Service: Berline [place]PO [name] 07/10/2020 1:15 PM Medical Record Number: [id number] Patient Account Number: [id number] Date of Birth/Sex: Treating RN: 01/17/1941 (79 y.o. Harvest DarkF) Dwiggins, [place] Primary Care Forestine Macho: HEDGECO CK, SUZA NNE Other Clinician: Referring Briella Hobday: Treating Efrat Zuidema/Extender: Cassandria AngerMadduri, Murthy HEDGECO CK, SUZA NNE Weeks in Treatment: 0 Wound Status Wound Number: 3 Primary Lymphedema Etiology: Wound Location: Right, Lateral Lower Leg Wound Open Wounding Event: Blister Status: Date Acquired: 06/26/2020 Comorbid Cataracts, Chronic  sinus problems/congestion, Anemia, Weeks Of Treatment: 0 History: Hypertension, Peripheral Venous Disease, Osteoarthritis, Clustered Wound: No Confinement Anxiety Photos Photo Uploaded By: Benjaman KindlerJones, Dedrick on 07/11/2020 11:24:36 Wound Measurements Length: (cm) 2.5 Width: (cm) 3 Depth: (cm) 0.1 Area: (cm) 5.89 Volume: (cm) 0.589 % Reduction in Area: % Reduction in Volume: Epithelialization: None Tunneling: No Undermining: No Wound Description Classification: Full Thickness Without Exposed Support Structures Wound Margin: Distinct, outline attached Exudate Amount: Medium Exudate Type: Serous Exudate Color: amber Foul Odor After Cleansing: No Slough/Fibrino Yes Wound Bed Granulation Amount: Medium (34-66%) Exposed Structure Granulation Quality: Pink, Pale Fascia Exposed: No Necrotic Amount: Medium (34-66%) Fat Layer (Subcutaneous Tissue) Exposed: Yes Necrotic Quality: Adherent Slough Tendon Exposed: No Muscle Exposed: No Joint Exposed: No Bone Exposed: No Electronic Signature(s) Signed: 07/10/2020 5:05:13 PM By: Cherylin Mylarwiggins, Shannon Entered By: Cherylin Mylarwiggins, Shannon on 07/10/2020 14:31:57 -------------------------------------------------------------------------------- Vitals Details Patient Name: Date of Service: Berline LopesPO RTER, [name] 07/10/2020 1:15 PM Medical Record Number: [id number] Patient Account Number: [id number] Date of Birth/Sex: Treating RN: 06/18/1941 (79 y.o. Harvest DarkF) Dwiggins, [place] Primary Care Rebbecca Osuna: HEDGECO CK, SUZA NNE Other Clinician: Referring Martesha Niedermeier: Treating Tiffanni Scarfo/Extender: Cassandria AngerMadduri, Murthy HEDGECO CK, SUZA NNE Weeks in Treatment: 0 Vital Signs Time Taken: 14:10 Temperature (F): 97.7 Height (in): 62 Pulse (bpm): 78 Source: Stated Respiratory Rate (breaths/min): 18 Weight (lbs): 135 Blood Pressure (mmHg): 129/52 Source: Stated Reference Range: 80 - 120 mg / dl Body Mass Index (BMI): 24.7 Electronic Signature(s) Signed: 07/10/2020 5:05:13 PM  By: Cherylin Mylarwiggins, Shannon Entered By: Cherylin Mylarwiggins, Shannon on 07/10/2020 14:13:12

## 2020-07-11 NOTE — Progress Notes (Signed)
Isabel Barnes, Isabel Barnes ([id number]) Visit Report for 07/10/2020 Chief Complaint Document Details Patient Name: Date of Service: [place], [name] 07/10/2020 1:15 PM Medical Record Number: [id number] Patient Account Number: [id number] Date of Birth/Sex: Treating RN: [birth year] (79 y.o. [name] Primary Care Provider: HEDGECO CK, SUZA NNE Other Clinician: Referring Provider: Treating Provider/Extender: Cassandria Anger HEDGECO CK, SUZA NNE Weeks in Treatment: 0 Information Obtained from: Patient Chief Complaint 04/12/2019; patient is here for a nonhealing wound on her right lateral calf. 07/10/2020 -Patient is here for a new wound on the left lateral calf and a very small burst blister wound on the right lateral calf Electronic Signature(s) Signed: 07/10/2020 2:56:25 PM By: Cassandria Anger MD, MBA Entered By: Cassandria Anger on 07/10/2020 14:56:25 -------------------------------------------------------------------------------- Debridement Details Patient Name: Date of Service: [name], [name] 07/10/2020 1:15 PM Medical Record Number: [id number] Patient Account Number: [id number] Date of Birth/Sex: Treating RN: 06/23/1941 (79 y.o. [name] Primary Care Provider: HEDGECO CK, SUZA NNE Other Clinician: Referring Provider: Treating Provider/Extender: Cassandria Anger HEDGECO CK, SUZA NNE Weeks in Treatment: 0 Debridement Performed for Assessment: Wound #2 Left,Lateral Lower Leg Performed By: Physician Cassandria Anger, MD Debridement Type: Debridement Severity of Tissue Pre Debridement: Fat layer exposed Level of Consciousness (Pre-procedure): Awake and Alert Pre-procedure Verification/Time Out Yes - 14:48 Taken: Start Time: 14:48 T Area Debrided (L x W): otal 2 (cm) x 2 (cm) = 4 (cm) Tissue and other material debrided: Viable, Non-Viable, Slough, Subcutaneous, Slough Level: Skin/Subcutaneous Tissue Debridement Description: Excisional Instrument: Curette Bleeding:  Minimum Hemostasis Achieved: Pressure End Time: 14:50 Procedural Pain: 3 Post Procedural Pain: 0 Response to Treatment: Procedure was tolerated well Level of Consciousness (Post- Awake and Alert procedure): Post Debridement Measurements of Total Wound Length: (cm) 3.8 Width: (cm) 4.5 Depth: (cm) 1 Volume: (cm) 13.43 Character of Wound/Ulcer Post Debridement: Requires Further Debridement Severity of Tissue Post Debridement: Fat layer exposed Post Procedure Diagnosis Same as Pre-procedure Electronic Signature(s) Signed: 07/10/2020 5:03:09 PM By: Cassandria Anger MD, MBA Signed: 07/11/2020 5:19:54 PM By: Zandra Abts RN, BSN Entered By: Zandra Abts on 07/10/2020 14:50:25 -------------------------------------------------------------------------------- HPI Details Patient Name: Date of Service: [name], [name] 07/10/2020 1:15 PM Medical Record Number: [id number] Patient Account Number: [id number] Date of Birth/Sex: Treating RN: [birth year] (78 y.o. [name] Primary Care Provider: HEDGECO CK, SUZA NNE Other Clinician: Referring Provider: Treating Provider/Extender: Cassandria Anger HEDGECO CK, SUZA NNE Weeks in Treatment: 0 History of Present Illness HPI Description: ADMISSION 04/12/2019 This is a pleasant 79 year old woman who comes in referred by her primary physician for review of a reopening of an original wound on the right lateral calf. The original wound was trauma against her dishwasher door on January 24. She was seen in an urgent care locally she was not sutured however she did get compression wraps and then this healed over. She states about a month ago there was increased swelling in the area reopened. She is here for our review of this. 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 Her past history is a bit complicated as well. She had a right total knee replacement 2 years ago and  has had edema in the right greater than left leg since. She had a duplex ultrasound of the right leg 2 years ago that was negative for a DVT However she continues to have significant edema in the right greater than . left lower leg.  She started to develop erythema in the right leg about a month ago although there is no pain unless she touches the area. She was put on clindamycin yesterday by her primary doctor. She also tells me that she has heart murmurs for many years and she has been referred by her primary doctor to cardiology but she does not yet have an appointment. She takes Lasix 20 mg a day. Past medical history includes hyperthyroidism, hypertension, widespread arthritis probably osteoarthritis, right total knee replacement, neck surgery, low back surgery, chronic pain. ABI in our clinic was 1.17 on the right 5/21-Patient was initiated on treatment at the wound center last week for a nonhealing right calf wound. 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 She was placed in 3 layer compression with silver alginate dressings and ABD on the wounds last week.Ulcer looks smaller 5/28; 3 layer compression with silver alginate to continue. Duplex ultrasound study ordered last week were apparently negative according to the patient although she had this done at a [facility] imaging site and I do not see the results in care everywhere. In any case they apparently saw a an area of superficial phlebitis on the right thigh and she has been using topical NSAIDs [Voltaren] and she states that area feels a lot better. She is going for an echocardiogram on June 4 and sees the cardiologist the same day 6/4; using 3 layer compression with silver alginate. Much better edema control still a small open area. She brought in her ultrasound from last week which showed a age indeterminant occlusive superficial thrombophlebitis involving the greater saphenous vein at the level of  the proximal thigh. The comment was made that there was no extension of this to the deep system. Presumably not felt to be at risk for deep vein migration. She also had her echocardiogram. I did not go over all of this in detail although she has a vigorous EF of 65%. Her right ventricular systolic function was normal. There was no increased right ventricular wall thickness. Right ventricular systolic pressure was mildly elevated at 38. She did not have significant valve disease. She had moderate thickening of the mitral valve but without measurable stenosis It would appear that her edema is not well explained by the duplex ultrasounds or her echocardiogram. I would wonder about venous imaging in her pelvis. Her edema was mostly pitting. 6/11; almost healed today. Surface eschar removed small open area remaining she should be healed by next week. I changed her to calcium alginate under 3 layer compression 6/18; the patient is healed today. Fully epithelialized. This was initially trauma in the setting of chronic venous insufficiency. At that time she had quite a bit of swelling in her leg pitting edema. Some of this was probably secondary to chronic venous insufficiency however was not sure we could blame all of this. She was put on some diuretics by enlarge the edema has resolved ---------------------------------- 07/10/2020 -Patient presents with wound to the left lateral calf that started out when she tripped and fell with laceration wound that happened about 2 weeks ago, she was seen by primary who prescribed some dressings including Xeroform patient had been using silver gel more lately she also finished a course of doxycycline and was referred to the wound clinic for further care She also had a small water blister on the right lateral calf that burst and led to a small open area Patient is ABIs today in  the clinic include 1.17 on the right and 0.96 on the left Patient denies any fevers chills,  denies any excessive swelling in her legs other than baseline, she uses a lymphedema pumps regularly every day, she also has foot problems with diminished pads and heel pain she got injected in the right heel for plantar fasciitis, History significant for hypertension, lymphedema, recent onset of recurrent falls and episodes of syncope from sitting to standing position Electronic Signature(s) Signed: 07/10/2020 2:58:54 PM By: Cassandria Anger MD, MBA Entered By: Cassandria Anger on 07/10/2020 14:58:54 -------------------------------------------------------------------------------- Physical Exam Details Patient Name: Date of Service: [name], [name] 07/10/2020 1:15 PM Medical Record Number: [id number] Patient Account Number: [id number] Date of Birth/Sex: Treating RN: Jul 27, 1941 (79 y.o. [name] Primary Care Provider: HEDGECO CK, SUZA NNE Other Clinician: Referring Provider: Treating Provider/Extender: Cassandria Anger HEDGECO CK, SUZA NNE Weeks in Treatment: 0 Constitutional alert and oriented x 3. sitting or standing blood pressure is within target range for patient.. supine blood pressure is within target range for patient.. pulse regular and within target range for patient.Marland Kitchen respirations regular, non-labored and within target range for patient.Marland Kitchen temperature within target range for patient.. . . Well- nourished and well-hydrated in no acute distress. Eyes conjunctiva clear no eyelid edema noted. pupils equal round and reactive to light and accommodation. Ears, Nose, Mouth, and Throat no gross abnormality of ear auricles or external auditory canals. normal hearing noted during conversation. mucus membranes moist. Neck supple with no LAD noted in anterior or posterior cervical chain. not enlarged. Respiratory normal breathing without difficulty. clear to auscultation bilaterally. Cardiovascular systolic murmur. no bruits with no significant JVD. 2+ femoral pulses. 2+ dorsalis  pedis/posterior tibialis pulses. no clubbing, cyanosis, <3 sec cap refill. Gastrointestinal (GI) soft, non-tender, non-distended, +BS. no hepatosplenomegaly. no ventral hernia noted. Musculoskeletal normal gait and posture. no significant deformity or arthritic changes, no loss or range of motion, no clubbing. full range of motion without deformity. full range of motion without deformity. full range of motion with greater than 10 degrees of flexion of the ankle. full range of motion with greater than 10 degrees of flexion of the ankle. Integumentary (Hair, Skin) normal hair distribution and pattern. skin pink, warm, dry. Neurological cranial nerves 2-12 intact. Patient has normal sensation in the feet bilaterally to light touch. Psychiatric this patient is able to make decisions and demonstrates good insight into disease process. Alert and Oriented x 3. pleasant and cooperative. Notes Significant edema B/L Lower extremities Left lateral leg wound with some degree of slough some of it was debrided about 25% of the wound surface slough was removed, the remainder surface has more fibrinous consistency and is also extremely tender The right lateral calf surface wound is small and surrounding skin appears intact Electronic Signature(s) Signed: 07/10/2020 3:01:29 PM By: Cassandria Anger MD, MBA Entered By: Cassandria Anger on 07/10/2020 15:01:27 -------------------------------------------------------------------------------- Physician Orders Details Patient Name: Date of Service: [name], [name] 07/10/2020 1:15 PM Medical Record Number: [id number] Patient Account Number: [id number] Date of Birth/Sex: Treating RN: June 24, 1941 (79 y.o. [name] Primary Care Provider: HEDGECO CK, SUZA NNE Other Clinician: Referring Provider: Treating Provider/Extender: Cassandria Anger HEDGECO CK, SUZA NNE Weeks in Treatment: 0 Verbal / Phone Orders: No Diagnosis Coding Follow-up  Appointments Return Appointment in 1 week. Dressing Change Frequency Wound #2 Left,Lateral Lower Leg Do not change entire dressing for one week. Wound #3 Right,Lateral Lower Leg Do not change entire dressing for one week. Skin Barriers/Peri-Wound Care Moisturizing  lotion - both legs Wound Cleansing May shower with protection. - use cast protector Primary Wound Dressing Wound #2 Left,Lateral Lower Leg Calcium Alginate with Silver Wound #3 Right,Lateral Lower Leg Calcium Alginate with Silver Secondary Dressing Wound #2 Left,Lateral Lower Leg Dry Gauze ABD pad Wound #3 Right,Lateral Lower Leg Dry Gauze ABD pad Edema Control 4 layer compression - Bilateral Avoid standing for long periods of time Elevate legs to the level of the heart or above for 30 minutes daily and/or when sitting, a frequency of: - throughout the day Exercise regularly Segmental Compressive Device. - lymphedema pumps twice a day for 1 hour each time Electronic Signature(s) Signed: 07/10/2020 5:03:09 PM By: Cassandria Anger MD, MBA Signed: 07/11/2020 5:19:54 PM By: Zandra Abts RN, BSN Entered By: Zandra Abts on 07/10/2020 15:02:34 -------------------------------------------------------------------------------- Problem List Details Patient Name: Date of Service: [name], [name] 07/10/2020 1:15 PM Medical Record Number: [id number] Patient Account Number: [id number] Date of Birth/Sex: Treating RN: 1940-12-19 (79 y.o. 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 07/10/2020 1:15 PM Medical Record Number: [id number] Patient Account Number: [id number] Date of Birth/Sex: Treating RN: 02/04/1941 (79 y.o. [name] Primary Care Provider: HEDGECO CK, SUZA NNE Other Clinician: Referring Provider: Treating Provider/Extender: Cassandria Anger HEDGECO CK, SUZA NNE Weeks in Treatment: 0 Subjective Chief Complaint Information obtained from Patient 04/12/2019; patient is here for a nonhealing wound on her right lateral calf. 07/10/2020 -Patient is here for a new wound on the left lateral calf and a very small burst blister wound on the right lateral calf History of Present Illness (HPI) ADMISSION 04/12/2019 This is a pleasant 79 year old woman who comes in referred by her primary physician for review of a reopening of an original wound on the right lateral calf. The original wound was trauma against her dishwasher door on January 24. She was seen in an urgent care locally she was not sutured however she did get compression wraps and then this healed over. She states about a month ago there was increased swelling in the area reopened. She is here for our review of this. 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 Her past history is a bit complicated as well. She had a right total knee replacement 2 years ago and has had edema in the right greater than left leg since. She had a duplex ultrasound of the right leg 2 years ago that was negative for a DVT However she  continues to have significant edema in the right greater than . left lower leg. She started to develop erythema in the right leg about a month ago although there is no pain unless she touches the area. She was put on clindamycin yesterday by her primary doctor. She also tells me that she has heart murmurs  for many years and she has been referred by her primary doctor to cardiology but she does not yet have an appointment. She takes Lasix 20 mg a day. Past medical history includes hyperthyroidism, hypertension, widespread arthritis probably osteoarthritis, right total knee replacement, neck surgery, low back surgery, chronic pain. ABI in our clinic was 1.17 on the right 5/21-Patient was initiated on treatment at the wound center last week for a nonhealing right calf wound. 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 She was placed in 3 layer compression with silver alginate dressings and ABD on the wounds last week.Ulcer looks smaller 5/28; 3 layer compression with silver alginate to continue. Duplex ultrasound study ordered last week were apparently negative according to the patient although she had this done at a [facility] imaging site and I do not see the results in care everywhere. In any case they apparently saw a an area of superficial phlebitis on the right thigh and she has been using topical NSAIDs [Voltaren] and she states that area feels a lot better. She is going for an echocardiogram on June 4 and sees the cardiologist the same day 6/4; using 3 layer compression with silver alginate. Much better edema control still a small open area. She brought in her ultrasound from last week which showed a age indeterminant occlusive superficial thrombophlebitis involving the greater saphenous vein at the level of the proximal thigh. The comment was made that there was no extension of this to the deep system. Presumably not felt to be at risk for deep vein migration.  She also had her echocardiogram. I did not go over all of this in detail although she has a vigorous EF of 65%. Her right ventricular systolic function was normal. There was no increased right ventricular wall thickness. Right ventricular systolic pressure was mildly elevated at 38. She did not have significant valve disease. She had moderate thickening of the mitral valve but without measurable stenosis It would appear that her edema is not well explained by the duplex ultrasounds or her echocardiogram. I would wonder about venous imaging in her pelvis. Her edema was mostly pitting. 6/11; almost healed today. Surface eschar removed small open area remaining she should be healed by next week. I changed her to calcium alginate under 3 layer compression 6/18; the patient is healed today. Fully epithelialized. This was initially trauma in the setting of chronic venous insufficiency. At that time she had quite a bit of swelling in her leg pitting edema. Some of this was probably secondary to chronic venous insufficiency however was not sure we could blame all of this. She was put on some diuretics by enlarge the edema has resolved ---------------------------------- 07/10/2020 -Patient presents with wound to the left lateral calf that started out when she tripped and fell with laceration wound that happened about 2 weeks ago, she was seen by primary who prescribed some dressings including Xeroform patient had been using silver gel more lately she also finished a course of doxycycline and was referred to the wound clinic for further care She also had a small water blister on the right lateral calf that burst and led to a small open area Patient is ABIs today in the clinic include 1.17 on the right and 0.96 on the left Patient denies any fevers chills, denies any excessive swelling in her legs other than baseline, she uses a lymphedema pumps regularly every day, she also has foot problems with diminished  pads and heel pain she got injected in the right heel for plantar fasciitis, History significant for hypertension, lymphedema, recent onset of recurrent falls and episodes of syncope from sitting to standing position Patient History Information obtained from Patient. Allergies gabapentin (Reaction: GI upset), hydromorphone (Reaction: respiratory arrest), montelukast, oxycodone (Reaction: severe sleepiness), Lyrica (Reaction: GI upset), banana (Reaction: GI upset), ciprofloxacin (Reaction: nausea/diarrhea), codeine (Reaction: hallucinations), methadone (Reaction: dizziness), metronidazole (Reaction: GI upset), Shellfish Containing Products (Reaction: GI upset, rash), iodine (Reaction: rash), cefuroxime (Reaction: GI upset), oyster extract (Reaction: rash), Sulfa (Sulfonamide Antibiotics) (Reaction: GI upset), sulfasalazine (Reaction: GI upset), latex (Severity: Moderate, Reaction: rash), lincomycin (Reaction: GI upset), penicillin (Severity: Severe, Reaction: rash), tapentadol Family History Cancer - Siblings, Diabetes - Father,Siblings, Heart Disease - Mother, Stroke - Siblings,Father, No family history of Hereditary Spherocytosis, Hypertension, Kidney Disease, Lung Disease, Seizures, Thyroid Problems, Tuberculosis. Social History Former smoker - quit 25 yrs ago, Marital Status - Divorced, Alcohol Use - Never, Drug Use - No History, Caffeine Use - Rarely. Medical History Eyes Patient has history of Cataracts - bil removed Denies history of Glaucoma, Optic Neuritis Ear/Nose/Mouth/Throat Patient has history of Chronic sinus problems/congestion Hematologic/Lymphatic Patient has history of Anemia Denies history of Hemophilia, Human Immunodeficiency Virus, Lymphedema, Sickle Cell Disease Cardiovascular Patient has history of Hypertension, Peripheral Venous Disease Denies history of Angina, Arrhythmia, Congestive Heart Failure, Coronary Artery Disease, Deep Vein Thrombosis, Hypotension,  Myocardial Infarction, Peripheral Arterial Disease, Phlebitis, Vasculitis Endocrine Denies history of Type I Diabetes, Type II Diabetes Genitourinary Denies history of End Stage Renal Disease Musculoskeletal Patient has history of Osteoarthritis - spondylosis Denies history of Gout, Rheumatoid Arthritis, Osteomyelitis Neurologic Denies history of Dementia, Neuropathy, Quadriplegia, Paraplegia, Seizure Disorder Oncologic Denies history of Received Chemotherapy, Received Radiation Psychiatric Patient has history of Confinement Anxiety Denies history of Anorexia/bulimia Hospitalization/Surgery History - plates in neck. - right shoulder replacement. - right knee replacement. - lumbar fusion. - hysterectomy. - hemorrhoidectomy. - cholecystectomy. - sinus surgery. - left rotator cuff repair x2. - bilateral bunionectomies. - left hand surgery. - umbilical hernia repair. Medical A Surgical History Notes nd Cardiovascular heart murmur Endocrine hypothyroidism Review of Systems (ROS) Constitutional Symptoms (General Health) Denies complaints or symptoms of Fatigue, Fever, Chills, Marked Weight Change. Respiratory Denies complaints or symptoms of Chronic or frequent coughs, Shortness of Breath. Gastrointestinal Denies complaints or symptoms of Frequent diarrhea, Nausea, Vomiting. Endocrine Denies complaints or symptoms of Heat/cold intolerance. Genitourinary Denies complaints or symptoms of Frequent urination. Integumentary (Skin) Denies complaints or symptoms of Wounds. Musculoskeletal Denies complaints or symptoms of Muscle Pain, Muscle Weakness. Neurologic Denies complaints or symptoms of Numbness/parasthesias. Objective Constitutional alert and oriented x 3. sitting or standing blood pressure is within target range for patient.. supine blood pressure is within target range for patient.. pulse regular and within target range for patient.Marland Kitchen respirations regular, non-labored and  within target range for patient.Marland Kitchen temperature within target range for patient.. Well- nourished and well-hydrated in no acute distress. Vitals Time Taken: 2:10 PM, Height: 62 in, Source: Stated, Weight: 135 lbs, Source: Stated, BMI: 24.7, Temperature: 97.7 F, Pulse: 78 bpm, Respiratory Rate: 18 breaths/min, Blood Pressure: 129/52 mmHg. Eyes conjunctiva clear no eyelid edema noted. pupils equal round and reactive to light and accommodation. Ears, Nose, Mouth, and Throat no gross abnormality of ear auricles or external auditory canals. normal hearing noted during conversation. mucus membranes moist. Neck supple with no LAD noted in anterior or posterior cervical chain. not enlarged. Respiratory normal breathing without difficulty. clear to auscultation bilaterally. Cardiovascular systolic  murmur. no bruits with no significant JVD. 2+ femoral pulses. 2+ dorsalis pedis/posterior tibialis pulses. no clubbing, cyanosis, Gastrointestinal (GI) soft, non-tender, non-distended, +BS. no hepatosplenomegaly. no ventral hernia noted. Musculoskeletal normal gait and posture. no significant deformity or arthritic changes, no loss or range of motion, no clubbing. full range of motion without deformity. full range of motion without deformity. full range of motion with greater than 10 degrees of flexion of the ankle. full range of motion with greater than 10 degrees of flexion of the ankle. Neurological cranial nerves 2-12 intact. Patient has normal sensation in the feet bilaterally to light touch. Psychiatric this patient is able to make decisions and demonstrates good insight into disease process. Alert and Oriented x 3. pleasant and cooperative. General Notes: Significant edema B/L Lower extremities Left lateral leg wound with some degree of slough some of it was debrided about 25% of the wound surface slough was removed, the remainder surface has more fibrinous consistency and is also extremely tender  The right lateral calf surface wound is small and surrounding skin appears intact Integumentary (Hair, Skin) normal hair distribution and pattern. skin pink, warm, dry. Wound #2 status is Open. Original cause of wound was Trauma. The wound is located on the Left,Lateral Lower Leg. The wound measures 3.8cm length x 4.5cm width x 1cm depth; 13.43cm^2 area and 13.43cm^3 volume. There is Fat Layer (Subcutaneous Tissue) Exposed exposed. There is no tunneling or undermining noted. There is a medium amount of serosanguineous drainage noted. The wound margin is well defined and not attached to the wound base. There is medium (34-66%) red, pink granulation within the wound bed. There is a medium (34-66%) amount of necrotic tissue within the wound bed including Adherent Slough. Wound #3 status is Open. Original cause of wound was Blister. The wound is located on the Right,Lateral Lower Leg. The wound measures 2.5cm length x 3cm width x 0.1cm depth; 5.89cm^2 area and 0.589cm^3 volume. There is Fat Layer (Subcutaneous Tissue) Exposed exposed. There is no tunneling or undermining noted. There is a medium amount of serous drainage noted. The wound margin is distinct with the outline attached to the wound base. There is medium (34- 66%) pink, pale granulation within the wound bed. There is a medium (34-66%) amount of necrotic tissue within the wound bed including Adherent Slough. 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 There was a Excisional Skin/Subcutaneous Tissue Debridement with a total area of 4 sq cm performed by  Cassandria Anger, MD. With the following instrument(s): Curette to remove Viable and Non-Viable tissue/material. Material removed includes Subcutaneous Tissue and Slough and. No specimens were taken. A time out was conducted at 14:48, prior to the start of the procedure. A Minimum amount of bleeding was controlled with Pressure. The procedure was tolerated well with a pain level of 3 throughout and a pain level of 0 following the procedure. Post Debridement Measurements: 3.8cm length x 4.5cm width x 1cm depth; 13.43cm^3 volume. Character of Wound/Ulcer Post Debridement requires further debridement. Severity of Tissue Post Debridement is: Fat layer exposed. Post procedure Diagnosis Wound #2: Same as Pre-Procedure Pre-procedure diagnosis of Wound #2 is a Venous Leg Ulcer located on the Left,Lateral Lower  Leg . There was a Four Layer Compression Therapy Procedure by Zandra AbtsLynch, Shatara, RN. Post procedure Diagnosis Wound #2: Same as Pre-Procedure Wound #3 Pre-procedure diagnosis of Wound #3 is a Venous Leg Ulcer located on the Right,Lateral Lower Leg . There was a Four Layer Compression Therapy Procedure by Zandra AbtsLynch, Shatara, RN. Post procedure Diagnosis Wound #3: Same as Pre-Procedure Plan Follow-up Appointments: Return Appointment in 1 week. Dressing Change Frequency: Wound #2 Left,Lateral Lower Leg: Do not change entire dressing for one week. Wound #3 Right,Lateral Lower Leg: Do not change entire dressing for one week. Skin Barriers/Peri-Wound Care: Moisturizing lotion - both legs Wound Cleansing: May shower with protection. - use cast protector Primary Wound Dressing: Wound #2 Left,Lateral Lower Leg: Calcium Alginate with Silver Wound #3 Right,Lateral Lower Leg: Calcium Alginate with Silver Secondary Dressing: Wound #2 Left,Lateral Lower Leg: Dry Gauze ABD pad Wound #3 Right,Lateral Lower Leg: Dry Gauze ABD pad Edema Control: 4 layer compression - Bilateral Avoid standing for long  periods of time Elevate legs to the level of the heart or above for 30 minutes daily and/or when sitting, a frequency of: - throughout the day Exercise regularly Segmental Compressive Device. - lymphedema pumps twice a day for 1 hour each time -Can start-with silver alginate under 4 layer compression for dressings on the 2 legs wounds -Patient is very compliant with her lymphedema pumps which can be continued -Patient asked to keep her legs elevated as much as possible -Return to clinic next week Electronic Signature(s) Signed: 07/10/2020 5:03:09 PM By: Cassandria AngerMadduri, Zebulan Hinshaw MD, MBA Signed: 07/11/2020 5:19:54 PM By: Zandra AbtsLynch, Shatara RN, BSN Previous Signature: 07/10/2020 3:02:26 PM Version By: Cassandria AngerMadduri, Parks Czajkowski MD, MBA Entered By: Zandra AbtsLynch, Shatara on 07/10/2020 15:26:44 -------------------------------------------------------------------------------- HxROS Details Patient Name: Date of Service: Berline LopesPO RTER, [name] 07/10/2020 1:15 PM Medical Record Number: [id number] Patient Account Number: [id number] Date of Birth/Sex: Treating RN: 01/06/1941 (79 y.o. Harvest DarkF) Dwiggins, Shannon Primary Care Provider: HEDGECO CK, SUZA NNE Other Clinician: Referring Provider: Treating Provider/Extender: Cassandria AngerMadduri, Saleena Tamas HEDGECO CK, SUZA NNE Weeks in Treatment: 0 Information Obtained From Patient Constitutional Symptoms (General Health) Complaints and Symptoms: Negative for: Fatigue; Fever; Chills; Marked Weight Change Respiratory Complaints and Symptoms: Negative for: Chronic or frequent coughs; Shortness of Breath Gastrointestinal Complaints and Symptoms: Negative for: Frequent diarrhea; Nausea; Vomiting Endocrine Complaints and Symptoms: Negative for: Heat/cold intolerance Medical History: Negative for: Type I Diabetes; Type II Diabetes Past Medical History Notes: hypothyroidism Genitourinary Complaints and Symptoms: Negative for: Frequent urination Medical History: Negative for: End Stage Renal  Disease Integumentary (Skin) Complaints and Symptoms: Negative for: Wounds Musculoskeletal Complaints and Symptoms: Negative for: Muscle Pain; Muscle Weakness Medical History: Positive for: Osteoarthritis - spondylosis Negative for: Gout; Rheumatoid Arthritis; Osteomyelitis Neurologic Complaints and Symptoms: Negative for: Numbness/parasthesias Medical History: Negative for: Dementia; Neuropathy; Quadriplegia; Paraplegia; Seizure Disorder Eyes Medical History: Positive for: Cataracts - bil removed Negative for: Glaucoma; Optic Neuritis Ear/Nose/Mouth/Throat Medical History: Positive for: Chronic sinus problems/congestion Hematologic/Lymphatic Medical History: Positive for: Anemia Negative for: Hemophilia; Human Immunodeficiency Virus; Lymphedema; Sickle Cell Disease Cardiovascular Medical History: Positive for: Hypertension; Peripheral Venous Disease Negative for: Angina; Arrhythmia; Congestive Heart Failure; Coronary Artery Disease; Deep Vein Thrombosis; Hypotension; Myocardial Infarction; Peripheral Arterial Disease; Phlebitis; Vasculitis Past Medical History Notes: heart murmur Immunological Oncologic Medical History: Negative for: Received Chemotherapy; Received Radiation Psychiatric Medical History: Positive for: Confinement Anxiety Negative for: Anorexia/bulimia HBO Extended History Items Ear/Nose/Mouth/Throat: Eyes: Chronic sinus Cataracts problems/congestion Immunizations Pneumococcal Vaccine: Received Pneumococcal Vaccination: Yes Implantable Devices Yes Hospitalization / Surgery History Type  of Hospitalization/Surgery plates in neck right shoulder replacement right knee replacement lumbar fusion hysterectomy hemorrhoidectomy cholecystectomy sinus surgery left rotator cuff repair x2 bilateral bunionectomies left hand surgery umbilical hernia repair Family and Social History Cancer: Yes - Siblings; Diabetes: Yes - Father,Siblings; Heart  Disease: Yes - Mother; Hereditary Spherocytosis: No; Hypertension: No; Kidney Disease: No; Lung Disease: No; Seizures: No; Stroke: Yes - Siblings,Father; Thyroid Problems: No; Tuberculosis: No; Former smoker - quit 25 yrs ago; Marital Status - Divorced; Alcohol Use: Never; Drug Use: No History; Caffeine Use: Rarely; Financial Concerns: No; Food, Clothing or Shelter Needs: No; Support System Lacking: No; Transportation Concerns: No Psychologist, prison and probation services) Signed: 07/10/2020 5:03:09 PM By: Cassandria Anger MD, MBA Signed: 07/10/2020 5:05:13 PM By: Cherylin Mylar Entered By: Cherylin Mylar on 07/10/2020 14:15:18 -------------------------------------------------------------------------------- SuperBill Details Patient Name: Date of Service: [name], [name] 07/10/2020 Medical Record Number: [id number] Patient Account Number: [id number] Date of Birth/Sex: Treating RN: Jan 11, 1941 (79 y.o. [name] Primary Care Provider: HEDGECO CK, SUZA NNE Other Clinician: Referring Provider: Treating Provider/Extender: Cassandria Anger HEDGECO CK, SUZA NNE Weeks in Treatment: 0 Diagnosis Coding ICD-10 Codes Code Description L97.211 Non-pressure chronic ulcer of right calf limited to breakdown of skin I89.0 Lymphedema, not elsewhere classified I50.20 Unspecified systolic (congestive) heart failure L97.222 Non-pressure chronic ulcer of left calf with fat layer exposed Facility Procedures CPT4 Code: 21308657 Description: 99214 - WOUND CARE VISIT-LEV 4 EST PT Modifier: 25 Quantity: 1 CPT4 Code: 84696295 Description: 11042 - DEB SUBQ TISSUE 20 SQ CM/< ICD-10 Diagnosis Description L97.222 Non-pressure chronic ulcer of left calf with fat layer exposed Modifier: Quantity: 1 Physician Procedures : CPT4 Code Description Modifier [id number] 99204 - WC PHYS LEVEL 4 - NEW PT 25 ICD-10 Diagnosis Description L97.222 Non-pressure chronic ulcer of left calf with fat layer exposed Quantity: 1 : [id number]  11042 - WC PHYS SUBQ TISS 20 SQ CM ICD-10 Diagnosis Description L97.222 Non-pressure chronic ulcer of left calf with fat layer exposed Quantity: 1 Electronic Signature(s) Signed: 07/10/2020 5:03:09 PM By: Cassandria Anger MD, MBA Signed: 07/11/2020 5:19:54 PM By: Zandra Abts RN, BSN Previous Signature: 07/10/2020 3:04:25 PM Version By: Cassandria Anger MD, MBA Entered By: Zandra Abts on 07/10/2020 15:26:54

## 2020-07-15 ENCOUNTER — Encounter (HOSPITAL_BASED_OUTPATIENT_CLINIC_OR_DEPARTMENT_OTHER): Payer: Medicare Other | Admitting: Internal Medicine

## 2020-07-17 ENCOUNTER — Encounter (HOSPITAL_BASED_OUTPATIENT_CLINIC_OR_DEPARTMENT_OTHER): Payer: Medicare Other | Admitting: Internal Medicine

## 2020-07-17 DIAGNOSIS — L97211 Non-pressure chronic ulcer of right calf limited to breakdown of skin: Secondary | ICD-10-CM | POA: Diagnosis not present

## 2020-07-17 NOTE — Progress Notes (Signed)
Isabel Barnes, Isabel Barnes ([id number]) Visit Report for 07/17/2020 Debridement Details Patient Name: Date of Service: PO [place], [name] 07/17/2020 3:15 PM Medical Record Number: [id number] Patient Account Number: [id number] Date of Birth/Sex: Treating RN: Apr 21, 1941 (79 y.o. Isabel Barnes Primary Care Provider: HEDGECO CK, SUZA NNE Other Clinician: Referring Provider: Treating Provider/Extender: Baltazar Najjar HEDGECO CK, SUZA NNE Weeks in Treatment: 1 Debridement Performed for Assessment: Wound #2 Left,Lateral Lower Leg Performed By: Physician Maxwell Caul., MD Debridement Type: Debridement Severity of Tissue Pre Debridement: Fat layer exposed Level of Consciousness (Pre-procedure): Awake and Alert Pre-procedure Verification/Time Out Yes - 16:20 Taken: Start Time: 16:20 T Area Debrided (L x W): otal 3 (cm) x 2 (cm) = 6 (cm) Tissue and other material debrided: Viable, Non-Viable, Slough, Subcutaneous, Slough Level: Skin/Subcutaneous Tissue Debridement Description: Excisional Instrument: Curette Bleeding: Minimum Hemostasis Achieved: Pressure End Time: 16:21 Procedural Pain: 0 Post Procedural Pain: 0 Response to Treatment: Procedure was tolerated well Level of Consciousness (Post- Awake and Alert procedure): Post Debridement Measurements of Total Wound Length: (cm) 3 Width: (cm) 2 Depth: (cm) 0.6 Volume: (cm) 2.827 Character of Wound/Ulcer Post Debridement: Improved Severity of Tissue Post Debridement: Fat layer exposed Post Procedure Diagnosis Same as Pre-procedure Electronic Signature(s) Signed: 07/17/2020 5:36:57 PM By: Zandra Abts RN, BSN Signed: 07/17/2020 5:57:44 PM By: Baltazar Najjar MD Entered By: Baltazar Najjar on 07/17/2020 17:30:20 -------------------------------------------------------------------------------- Debridement Details Patient Name: Date of Service: Isabel Lopes, Isabel [name] 07/17/2020 3:15 PM Medical Record Number: [id number] Patient  Account Number: [id number] Date of Birth/Sex: Treating RN: 1941/02/26 (79 y.o. Isabel Barnes Primary Care Provider: HEDGECO CK, SUZA NNE Other Clinician: Referring Provider: Treating Provider/Extender: Baltazar Najjar HEDGECO CK, SUZA NNE Weeks in Treatment: 1 Debridement Performed for Assessment: Wound #3 Right,Lateral Lower Leg Performed By: Clinician Zandra Abts, RN Debridement Type: Chemical/Enzymatic/Mechanical Agent Used: wound cleanser and gauze Severity of Tissue Pre Debridement: Fat layer exposed Level of Consciousness (Pre-procedure): Awake and Alert Pre-procedure Verification/Time Out No Taken: Bleeding: Minimum Hemostasis Achieved: Pressure Procedural Pain: 0 Post Procedural Pain: 0 Response to Treatment: Procedure was tolerated well Level of Consciousness (Post- Awake and Alert procedure): Post Debridement Measurements of Total Wound Length: (cm) 0.2 Width: (cm) 0.2 Depth: (cm) 0.2 Volume: (cm) 0.006 Character of Wound/Ulcer Post Debridement: Improved Severity of Tissue Post Debridement: Fat layer exposed Post Procedure Diagnosis Same as Pre-procedure Electronic Signature(s) Signed: 07/17/2020 5:36:57 PM By: Zandra Abts RN, BSN Signed: 07/17/2020 5:57:44 PM By: Baltazar Najjar MD Entered By: Baltazar Najjar on 07/17/2020 17:30:30 -------------------------------------------------------------------------------- HPI Details Patient Name: Date of Service: Isabel Lopes, Isabel [name] 07/17/2020 3:15 PM Medical Record Number: [id number] Patient Account Number: [id number] Date of Birth/Sex: Treating RN: 12-10-1940 (79 y.o. Isabel Barnes Primary Care Provider: HEDGECO CK, SUZA NNE Other Clinician: Referring Provider: Treating Provider/Extender: Baltazar Najjar HEDGECO CK, SUZA NNE Weeks in Treatment: 1 History of Present Illness HPI Description: ADMISSION 04/12/2019 This is a pleasant 79 year old woman who comes in referred by her primary physician for review of  a reopening of an original wound on the right lateral calf. The original wound was trauma against her dishwasher door on January 24. She was seen in an urgent care locally she was not sutured however she did get compression wraps and then this healed over. She states about a month ago there was increased swelling in the area reopened. She is here for our review of this. 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 Her past history is a bit complicated as well. She had a right total knee replacement 2 years ago and has had edema in the right greater than left leg since. She had a duplex ultrasound of the right leg 2 years ago that was negative for a DVT However she continues to have significant edema in the right greater than . left lower leg. She started to develop erythema in the right leg about a month ago although there is no pain unless she touches the area. She was put on clindamycin yesterday by her primary doctor. She also tells me that she has heart murmurs for many years and she has been referred by her primary doctor to cardiology but she does not yet have an appointment. She takes Lasix 20 mg a day. Past medical history includes hyperthyroidism, hypertension, widespread arthritis probably osteoarthritis, right total knee replacement, neck surgery, low back surgery, chronic pain. ABI in our clinic was 1.17 on the right 5/21-Patient was initiated on treatment at the wound center last week for a nonhealing right calf wound. 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 She was placed in 3 layer compression with silver alginate dressings and ABD on the wounds last week.Ulcer looks smaller 5/28; 3 layer compression with silver alginate to continue. Duplex ultrasound study ordered last week were apparently negative according to the patient although she had this done at a [facility] site and I do not see the results in care everywhere. In any case they apparently saw a an area of superficial phlebitis on the right thigh and she has been using topical NSAIDs [Voltaren] and she states that area feels a lot better. She is going for an echocardiogram on June 4 and sees the cardiologist the same day 6/4; using 3 layer compression with silver alginate. Much better edema control still a small open area. She brought in her ultrasound from last week which showed a age indeterminant occlusive superficial thrombophlebitis involving the greater saphenous vein at the level of the proximal thigh. The comment was made that there was no extension of this to the deep system. Presumably not felt to be at risk for deep vein migration. She also had her echocardiogram. I did not go over all of this in detail although she has a vigorous EF of 65%. Her right ventricular systolic function was normal. There was no increased right ventricular wall thickness. Right ventricular systolic pressure was mildly elevated at 38. She did not have significant valve disease. She had moderate thickening of the mitral valve but without measurable stenosis It would appear that her edema is not well explained by the duplex ultrasounds or her echocardiogram. I would wonder about venous imaging in her pelvis. Her edema was mostly pitting. 6/11; almost healed today. Surface eschar removed small open area remaining she should be healed by next week. I changed her to calcium alginate under 3 layer compression 6/18; the patient is healed today. Fully epithelialized. This was initially trauma in the setting of chronic venous insufficiency. At that time she had quite a bit of swelling in her leg pitting edema. Some of this was probably secondary to chronic venous insufficiency however was not sure we could blame all of this. She was put on some diuretics by enlarge the edema has resolved READMISSION 07/10/2020 -Patient  presents with wound to the left lateral calf that started out when she tripped and fell with  laceration wound that happened about 2 weeks ago, she was seen by primary who prescribed some dressings including Xeroform patient had been using silver gel more lately she also finished a course of doxycycline and was referred to the wound clinic for further care She also had a small water blister on the right lateral calf that burst and led to a small open area Patient is ABIs today in the clinic include 1.17 on the right and 0.96 on the left Patient denies any fevers chills, denies any excessive swelling in her legs other than baseline, she uses a lymphedema pumps regularly every day, she also has foot problems with diminished pads and heel pain she got injected in the right heel for plantar fasciitis, History significant for hypertension, lymphedema, recent onset of recurrent falls and episodes of syncope from sitting to standing position 07/17/2020; patient was readmitted to the clinic after a difficult fall resulting in wounds to her bilateral lower extremities. She states this occurred in the setting of a bathroom renovation she is doing in her house. She has a fairly significant deep punched out area on the left upper lateral lower leg a much smaller and benign looking area on the right lower leg. We put her in compression and use silver alginate bilaterally Electronic Signature(s) Signed: 07/17/2020 5:57:44 PM By: Baltazar Najjar MD Entered By: Baltazar Najjar on 07/17/2020 17:31:59 -------------------------------------------------------------------------------- Physical Exam Details Patient Name: Date of Service: Isabel Lopes, Isabel [name] 07/17/2020 3:15 PM Medical Record Number: [id number] Patient Account Number: [id number] Date of Birth/Sex: Treating RN: 1940/12/11 (79 y.o. Isabel Barnes Primary Care Provider: HEDGECO CK, SUZA NNE Other Clinician: Referring Provider: Treating Provider/Extender:  Baltazar Najjar HEDGECO CK, SUZA NNE Weeks in Treatment: 1 Constitutional Sitting or standing Blood Pressure is within target range for patient.. Pulse regular and within target range for patient.Marland Kitchen Respirations regular, non-labored and within target range.. Temperature is normal and within the target range for the patient.Marland Kitchen Appears in no distress. Notes Wound exam; much better edema control. The area on the right leg is actually fairly benign looking and looks like it is on his way to closure. However the area on the left lateral calf superiorly required debridement with a #3 curette nonviable surface adherent debris and callus and nonviable subcutaneous tissue around the wound margin this looked a lot better when this was removed hemostasis with direct pressure Electronic Signature(s) Signed: 07/17/2020 5:57:44 PM By: Baltazar Najjar MD Entered By: Baltazar Najjar on 07/17/2020 17:33:01 -------------------------------------------------------------------------------- Physician Orders Details Patient Name: Date of Service: Isabel Lopes, Isabel [name] 07/17/2020 3:15 PM Medical Record Number: [id number] Patient Account Number: [id number] Date of Birth/Sex: Treating RN: 1941-08-30 (79 y.o. 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 Dressing Change Frequency Wound #2 Left,Lateral Lower Leg Do not change entire dressing for one week. Wound #3 Right,Lateral Lower Leg Do not  change entire dressing for one week. Skin Barriers/Peri-Wound Care Moisturizing lotion - both legs Wound Cleansing May shower with protection. - use cast protector Primary Wound Dressing Wound #2 Left,Lateral Lower Leg Silver Collagen Wound #3 Right,Lateral Lower Leg Foam  Secondary Dressing Wound #2 Left,Lateral Lower Leg Dry Gauze ABD pad Wound #3 Right,Lateral Lower Leg Dry Gauze Edema Control 4 layer compression - Bilateral Avoid standing for long periods of time Elevate legs to the level of the heart or above for 30 minutes daily and/or when sitting, a frequency of: - throughout the day Exercise regularly Segmental Compressive Device. - lymphedema pumps twice a day for 1 hour each time Electronic Signature(s) Signed: 07/17/2020 5:36:57 PM By: Zandra Abts RN, BSN Signed: 07/17/2020 5:57:44 PM By: Baltazar Najjar MD Entered By: Zandra Abts on 07/17/2020 16:26:17 -------------------------------------------------------------------------------- Problem List Details Patient Name: Date of Service: Isabel Lopes, Isabel [name] 07/17/2020 3:15 PM Medical Record Number: [id number] Patient Account Number: [id number] Date of Birth/Sex: Treating RN: 1941/02/03 (79 y.o. 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 07/17/2020 3:15 PM Medical Record Number: [id number] Patient Account Number: [id number] Date of Birth/Sex: Treating RN: 1941-03-15 (79 y.o. Isabel Barnes Primary Care Provider: HEDGECO CK, SUZA NNE Other Clinician: Referring Provider: Treating Provider/Extender: Baltazar Najjar HEDGECO CK, SUZA NNE Weeks in Treatment: 1 Subjective History of Present Illness (HPI) ADMISSION 04/12/2019 This is a pleasant 79 year old woman who comes in referred by her primary physician for review of a reopening of an original wound on the right lateral calf. The original wound was trauma against her dishwasher door on January 24. She was seen in an urgent care locally she was not sutured however she did get compression wraps and then this healed over. She states about a month ago there was increased swelling in the area reopened. She is here for our review of this. 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 Her past history is a bit complicated as well. She had a right total knee replacement 2 years ago and has had edema in the right greater than left leg since. She had a duplex ultrasound of the right leg 2 years ago that was negative for a DVT However she continues to have significant edema in the right greater than . left lower leg. She started to develop erythema in the right leg about a month ago although there is no pain unless she touches the area. She was put on clindamycin yesterday by her primary doctor. She also tells me that she has heart murmurs for many years and she has been referred by her primary doctor to cardiology but she does not yet have an appointment. She takes Lasix 20 mg a day. Past medical history includes hyperthyroidism,  hypertension, widespread arthritis probably osteoarthritis, right total knee replacement, neck surgery, low back surgery, chronic pain. ABI in our clinic was 1.17 on the right 5/21-Patient was initiated on treatment at the wound center last week for a nonhealing right calf wound. 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 She was placed in 3 layer compression with silver alginate dressings and ABD on the wounds last week.Ulcer looks smaller 5/28; 3 layer compression with silver alginate to continue. Duplex ultrasound study ordered last week were apparently negative according to the patient although she had this done at a [facility] imaging site and I do not see the results in care everywhere. In any case they apparently saw a an area of superficial phlebitis on the right thigh and she has been using topical NSAIDs [Voltaren] and she states that area feels a lot better. She is going for an echocardiogram on June 4 and sees the cardiologist the same day 6/4; using 3 layer compression with silver alginate. Much better edema control still a small open area. She brought in her ultrasound from last week which showed a age indeterminant occlusive superficial thrombophlebitis involving the greater saphenous vein at the level of the proximal thigh. The comment was made that there was no extension of this to the deep system. Presumably not felt to be at risk for deep vein migration. She also had her echocardiogram. I did not go over all of this in detail although she has a vigorous EF of 65%. Her right ventricular systolic function was normal. There was no increased right ventricular wall thickness. Right ventricular systolic pressure was mildly elevated at 38. She did not have significant valve disease. She had moderate thickening of the mitral valve but without measurable stenosis It would appear that her edema is not well explained by the duplex ultrasounds or her  echocardiogram. I would wonder about venous imaging in her pelvis. Her edema was mostly pitting. 6/11; almost healed today. Surface eschar removed small open area remaining she should be healed by next week. I changed her to calcium alginate under 3 layer compression 6/18; the patient is healed today. Fully epithelialized. This was initially trauma in the setting of chronic venous insufficiency. At that time she had quite a bit of swelling in her leg pitting edema. Some of this was probably secondary to chronic venous insufficiency however was not sure we could blame all of this. She was put on some diuretics by enlarge the edema has resolved READMISSION 07/10/2020 -Patient presents with wound to the left lateral calf that started out when she tripped and fell with laceration wound that happened about 2 weeks ago, she was seen by primary who prescribed some dressings including Xeroform patient had been using silver gel more lately she also finished a course of doxycycline and was referred to the wound clinic for further care She also had a small water blister on the right lateral calf that burst and led to a small open area Patient is ABIs today in the clinic include 1.17 on the right and 0.96 on the left Patient denies any fevers chills, denies any excessive swelling in her legs other than baseline, she uses a lymphedema pumps regularly every day, she also has foot problems with diminished pads and heel pain she got injected in the right heel for plantar fasciitis, History significant for hypertension, lymphedema, recent onset of recurrent falls and episodes of syncope from sitting to standing position 07/17/2020; patient was readmitted to the clinic after a difficult fall resulting in wounds to her bilateral lower extremities. She states this occurred in the setting of a bathroom renovation she is doing in her house. She has a fairly significant deep punched out area on the left upper lateral lower  leg a much smaller and benign looking area on the right lower leg. We put her in compression and use silver alginate bilaterally Objective Constitutional Sitting or standing Blood Pressure is within target range for patient.. Pulse regular and within target range for patient.Marland Kitchen Respirations regular, non-labored and within target range.. Temperature is normal and within the target range for the patient.Marland Kitchen Appears in no distress. Vitals Time Taken: 3:35 PM, Height: 62 in, Weight: 135 lbs, BMI: 24.7, Temperature: 98.4 F, Pulse: 88 bpm, Respiratory Rate: 18 breaths/min, Blood Pressure: 118/54 mmHg. General Notes: Wound exam; much better edema control. The area on the right leg is actually fairly benign looking and looks like it is on his way to closure. However the area on the left lateral calf superiorly required debridement with a #3 curette nonviable surface adherent debris and callus and nonviable subcutaneous tissue around the wound margin this looked a lot better when this was removed hemostasis with direct pressure Integumentary (Hair, Skin) Wound #2 status is Open. Original cause of wound was Trauma. The wound is located on the Left,Lateral Lower Leg. The wound measures 3cm length x 2cm width x 0.6cm depth; 4.712cm^2 area and 2.827cm^3 volume. There is Fat Layer (Subcutaneous Tissue) Exposed exposed. There is no tunneling noted, however, there is undermining starting at 12:00 and ending at 1:00 with a maximum distance of 0.4cm. There is a medium amount of serosanguineous drainage noted. The wound margin is distinct with the outline attached to the wound base. There is medium (34-66%) pink granulation within the wound bed. There is a medium (34-66%) amount of necrotic tissue within the wound bed including Adherent Slough. Wound #3 status is Open. Original cause of wound was Blister. The wound is located on the Right,Lateral Lower Leg. The wound measures 0.2cm length x 0.2cm width x 0.2cm depth;  0.031cm^2 area and 0.006cm^3 volume. There is Fat Layer (Subcutaneous Tissue) Exposed exposed. There is no tunneling or undermining noted. There is a small amount of serosanguineous drainage noted. The wound margin is distinct with the outline attached to the wound base. There is large (67-100%) pink granulation within the wound bed. There is no necrotic tissue within the wound bed. 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 There was a Excisional Skin/Subcutaneous Tissue Debridement with a total area of 6 sq cm performed by Maxwell Caul., MD. With the following instrument(s): Curette to remove Viable and Non-Viable tissue/material. Material removed includes Subcutaneous Tissue and Slough and. No specimens were taken. A time out was conducted at 16:20, prior to the start of the procedure. A Minimum amount of bleeding was controlled with Pressure. The procedure was tolerated well with a pain level of 0 throughout and a pain level of 0 following the procedure. Post Debridement Measurements: 3cm length x 2cm width x 0.6cm depth; 2.827cm^3 volume. Character of Wound/Ulcer Post Debridement is improved. Severity of Tissue Post Debridement is: Fat layer exposed. Post procedure Diagnosis Wound #2: Same as Pre-Procedure Pre-procedure diagnosis of Wound #2 is a Venous Leg Ulcer located on the Left,Lateral Lower Leg . There was a Four Layer Compression Therapy Procedure by Zandra Abts, RN. Post procedure Diagnosis Wound #2: Same as Pre-Procedure Wound #3  Pre-procedure diagnosis of Wound #3 is a Venous Leg Ulcer  located on the Right,Lateral Lower Leg .Severity of Tissue Pre Debridement is: Fat layer exposed. There was a Excisional Skin/Subcutaneous Tissue Chemical/Enzymatic/Mechanical performed by Zandra Abts, RN. to remove Viable and Non-Viable tissue/material. Material removed includes Subcutaneous Tissue and Slough and. Other agent used was wound cleanser and gauze. A Minimum amount of bleeding was controlled with Pressure. The procedure was tolerated well with a pain level of 0 throughout and a pain level of 0 following the procedure. Post Debridement Measurements: 0.2cm length x 0.2cm width x 0.2cm depth; 0.006cm^3 volume. Character of Wound/Ulcer Post Debridement is improved. Severity of Tissue Post Debridement is: Fat layer exposed. Post procedure Diagnosis Wound #3: Same as Pre-Procedure Pre-procedure diagnosis of Wound #3 is a Venous Leg Ulcer located on the Right,Lateral Lower Leg . There was a Four Layer Compression Therapy Procedure by Zandra Abts, RN. Post procedure Diagnosis Wound #3: Same as Pre-Procedure Plan Follow-up Appointments: Return Appointment in 1 week. Dressing Change Frequency: Wound #2 Left,Lateral Lower Leg: Do not change entire dressing for one week. Wound #3 Right,Lateral Lower Leg: Do not change entire dressing for one week. Skin Barriers/Peri-Wound Care: Moisturizing lotion - both legs Wound Cleansing: May shower with protection. - use cast protector Primary Wound Dressing: Wound #2 Left,Lateral Lower Leg: Silver Collagen Wound #3 Right,Lateral Lower Leg: Foam Secondary Dressing: Wound #2 Left,Lateral Lower Leg: Dry Gauze ABD pad Wound #3 Right,Lateral Lower Leg: Dry Gauze Edema Control: 4 layer compression - Bilateral Avoid standing for long periods of time Elevate legs to the level of the heart or above for 30 minutes daily and/or when sitting, a frequency of: - throughout the day Exercise regularly Segmental Compressive Device. - lymphedema  pumps twice a day for 1 hour each time 1. I change the primary dressing on both wounds to silver collagen 2. I am hopeful to stimulate some granulation in the left leg post debridement. May need to change the dressing if the debris on the surface is recurrent. 3. We are ordering her bilateral Farrow wraps stockings. She has lymphedema pumps that she is using twice a day for 1 hour. Electronic Signature(s) Signed: 07/17/2020 5:57:44 PM By: Baltazar Najjar MD Entered By: Baltazar Najjar on 07/17/2020 17:34:13 -------------------------------------------------------------------------------- SuperBill Details Patient Name: Date of Service: Isabel Lopes, Isabel [name] 07/17/2020 Medical Record Number: [id number] Patient Account Number: [id number] Date of Birth/Sex: Treating RN: [birth year] (79 y.o. 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 I89.0 Lymphedema, not elsewhere classified I50.20 Unspecified systolic (congestive) heart failure Facility Procedures CPT4 Code: [id number] Description: 85027 - DEBRIDE W/O ANES NON SELECT Modifier: Quantity: 1 CPT4 Code: [id number] Description: 11042 - DEB SUBQ TISSUE 20 SQ CM/< ICD-10 Diagnosis Description L97.222 Non-pressure chronic ulcer of left calf with fat layer exposed Modifier: Quantity: 1 Physician Procedures : CPT4 Code Description Modifier [id number] 11042 - WC PHYS SUBQ TISS 20 SQ CM ICD-10 Diagnosis Description L97.222 Non-pressure chronic ulcer of left calf with fat layer exposed Quantity: 1 Electronic Signature(s) Signed: 07/17/2020 5:57:44 PM By: Baltazar Najjar MD Entered By: Baltazar Najjar on 07/17/2020  17:35:51

## 2020-07-24 ENCOUNTER — Encounter (HOSPITAL_BASED_OUTPATIENT_CLINIC_OR_DEPARTMENT_OTHER): Payer: Medicare Other | Admitting: Internal Medicine

## 2020-07-24 ENCOUNTER — Other Ambulatory Visit: Payer: Self-pay

## 2020-07-24 DIAGNOSIS — L97211 Non-pressure chronic ulcer of right calf limited to breakdown of skin: Secondary | ICD-10-CM | POA: Diagnosis not present

## 2020-07-24 NOTE — Progress Notes (Signed)
Isabel Barnes, Isabel Barnes ([id number]) Visit Report for 07/24/2020 HPI Details Patient Name: Date of Service: PO [place], [name] 07/24/2020 12:45 PM Medical Record Number: [id number] Patient Account Number: [id number] Date of Birth/Sex: Treating RN: 08-30-1941 (79 y.o. Isabel Barnes Primary Care Provider: HEDGECO CK, SUZA NNE Other Clinician: Referring Provider: Treating Provider/Extender: Baltazar Najjar HEDGECO CK, SUZA NNE Weeks in Treatment: 2 History of Present Illness HPI Description: ADMISSION 04/12/2019 This is a pleasant 79 year old woman who comes in referred by her primary physician for review of a reopening of an original wound on the right lateral calf. The original wound was trauma against her dishwasher door on January 24. She was seen in an urgent care locally she was not sutured however she did get compression wraps and then this healed over. She states about a month ago there was increased swelling in the area reopened. She is here for our review of this. 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 Her past history is a bit complicated as well. She had a right total knee replacement 2 years ago and has had edema in the right greater than left leg since. She had a duplex ultrasound of the right leg 2 years ago that was negative for a DVT However she continues to have significant edema in the right greater than . left lower leg. She started to develop erythema in the right leg about a month ago although there is no pain unless she touches the area. She was put on clindamycin yesterday by her primary doctor. She also tells me that she has heart murmurs for many years and she has been referred by her primary doctor to cardiology but she does not yet have an appointment. She takes Lasix 20 mg a day. Past medical history includes hyperthyroidism, hypertension, widespread arthritis probably osteoarthritis,  right total knee replacement, neck surgery, low back surgery, chronic pain. ABI in our clinic was 1.17 on the right 5/21-Patient was initiated on treatment at the wound center last week for a nonhealing right calf wound. 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 She was placed in 3 layer compression with silver alginate dressings and ABD on the wounds last week.Ulcer looks smaller 5/28; 3 layer compression with silver alginate to continue. Duplex ultrasound study ordered last week were apparently negative according to the patient although she had this done at a [facility] imaging site and I do not see the results in care everywhere. In any case they apparently saw a an area of superficial phlebitis on the right thigh and she has been using topical NSAIDs [Voltaren] and she states that area feels a lot better. She is going for an echocardiogram on June 4 and sees the cardiologist the same day 6/4; using 3 layer compression with silver alginate. Much better edema control still a small open area. She brought in her ultrasound from last week which showed a age indeterminant occlusive superficial thrombophlebitis involving the greater saphenous vein at the level of the proximal thigh. The comment was made that there was no extension of this to the deep system. Presumably not felt to be at risk for deep vein migration. She also had her echocardiogram. I did not go over all of this in detail although she has a vigorous EF of 65%. Her right ventricular systolic function was normal. There was no increased right ventricular  wall thickness. Right ventricular systolic pressure was mildly elevated at 38. She did not have significant valve disease. She had moderate thickening of the mitral valve but without measurable stenosis It would appear that her edema is not well explained by the duplex ultrasounds or her echocardiogram. I would wonder about venous imaging in her pelvis.  Her edema was mostly pitting. 6/11; almost healed today. Surface eschar removed small open area remaining she should be healed by next week. I changed her to calcium alginate under 3 layer compression 6/18; the patient is healed today. Fully epithelialized. This was initially trauma in the setting of chronic venous insufficiency. At that time she had quite a bit of swelling in her leg pitting edema. Some of this was probably secondary to chronic venous insufficiency however was not sure we could blame all of this. She was put on some diuretics by enlarge the edema has resolved READMISSION 07/10/2020 -Patient presents with wound to the left lateral calf that started out when she tripped and fell with laceration wound that happened about 2 weeks ago, she was seen by primary who prescribed some dressings including Xeroform patient had been using silver gel more lately she also finished a course of doxycycline and was referred to the wound clinic for further care She also had a small water blister on the right lateral calf that burst and led to a small open area Patient is ABIs today in the clinic include 81 on the right and 0.96 on the left Patient denies any fevers chills, denies any excessive swelling in her legs other than baseline, she uses a lymphedema pumps regularly every day, she also has foot problems with diminished pads and heel pain she got injected in the right heel for plantar fasciitis, History significant for hypertension, lymphedema, recent onset of recurrent falls and episodes of syncope from sitting to standing position 07/17/2020; patient was readmitted to the clinic after a difficult fall resulting in wounds to her bilateral lower extremities. She states this occurred in the setting of a bathroom renovation she is doing in her house. She has a fairly significant deep punched out area on the left upper lateral lower leg a much smaller and benign looking area on the right lower leg.  We put her in compression and use silver alginate bilaterally 8/26; the wounds on her right leg are closed. She has her new Farrow wraps to put on today. On the left she has an open area on the left lateral upper calf this is come down in size and looks quite good. We have been using silver alginate under compression Electronic Signature(s) Signed: 07/24/2020 6:08:30 PM By: Baltazar Najjar MD Entered By: Baltazar Najjar on 07/24/2020 13:18:59 -------------------------------------------------------------------------------- Physical Exam Details Patient Name: Date of Service: Isabel Lopes, Isabel [name] 07/24/2020 12:45 PM Medical Record Number: [id number] Patient Account Number: [id number] Date of Birth/Sex: Treating RN: 11-01-1941 (79 y.o. Isabel Barnes Primary Care Provider: HEDGECO CK, SUZA NNE Other Clinician: Referring Provider: Treating Provider/Extender: Baltazar Najjar HEDGECO CK, SUZA NNE Weeks in Treatment: 2 Constitutional Patient is hypertensive.. Pulse regular and within target range for patient.Marland Kitchen Respirations regular, non-labored and within target range.. Temperature is normal and within the target range for the patient.Marland Kitchen Appears in no distress. Cardiovascular Pedal pulses are palpable. Excellent edema control bilaterally. Notes Wound exam; the area on the right leg is closed over. The left lateral calf has a healthy looking granulated base it is come down in dimensions. No evidence of infection Electronic Signature(s)  Signed: 07/24/2020 6:08:30 PM By: Baltazar Najjar MD Entered By: Baltazar Najjar on 07/24/2020 13:20:13 -------------------------------------------------------------------------------- Physician Orders Details Patient Name: Date of Service: Isabel Lopes, Isabel [name] 07/24/2020 12:45 PM Medical Record Number: [id number] Patient Account Number: [id number] Date of Birth/Sex: Treating RN: Sep 29, 1941 (79 y.o. Isabel Barnes Primary Care Provider: HEDGECO CK, SUZA NNE  Other Clinician: Referring Provider: Treating Provider/Extender: Baltazar Najjar HEDGECO CK, SUZA NNE Weeks in Treatment: 2 Verbal / Phone Orders: No Diagnosis Coding ICD-10 Coding Code Description L97.222 Non-pressure chronic ulcer of left calf with fat layer exposed L97.302 Non-pressure chronic ulcer of unspecified ankle with fat layer exposed I89.0 Lymphedema, not elsewhere classified I50.20 Unspecified systolic (congestive) heart failure Follow-up Appointments Return Appointment in 1 week. Dressing Change Frequency Wound #2 Left,Lateral Lower Leg Do not change entire dressing for one week. Skin Barriers/Peri-Wound Care Moisturizing lotion Wound Cleansing Wound #2 Left,Lateral Lower Leg May shower with protection. - use cast protector Primary Wound Dressing Wound #2 Left,Lateral Lower Leg Silver Collagen Secondary Dressing Wound #2 Left,Lateral Lower Leg Dry Gauze ABD pad Edema Control 4 layer compression: Left lower extremity Avoid standing for long periods of time Elevate legs to the level of the heart or above for 30 minutes daily and/or when sitting, a frequency of: - throughout the day Exercise regularly Support Garment 20-30 mm/Hg pressure to: - Farrow wrap to right leg daily, apply first thing in the morning, remove at night before bed Segmental Compressive Device. - lymphedema pumps twice a day for 1 hour each time Electronic Signature(s) Signed: 07/24/2020 4:47:33 PM By: Zandra Abts RN, BSN Signed: 07/24/2020 6:08:30 PM By: Baltazar Najjar MD Entered By: Zandra Abts on 07/24/2020 13:17:55 -------------------------------------------------------------------------------- Problem List Details Patient Name: Date of Service: Isabel Lopes, Isabel [name] 07/24/2020 12:45 PM Medical Record Number: [id number] Patient Account Number: [id number] Date of Birth/Sex: Treating RN: 1941-03-27 (79 y.o. Isabel Barnes Primary Care Provider: HEDGECO CK, SUZA NNE Other  Clinician: Referring Provider: Treating Provider/Extender: Baltazar Najjar HEDGECO CK, SUZA NNE Weeks in Treatment: 2 Active Problems ICD-10 Encounter Code Description Active Date MDM Diagnosis L97.222 Non-pressure chronic ulcer of left calf with fat layer exposed 07/17/2020 No Yes L97.302 Non-pressure chronic ulcer of unspecified ankle with fat layer exposed 07/10/2020 No Yes I89.0 Lymphedema, not elsewhere classified 07/10/2020 No Yes I50.20 Unspecified systolic (congestive) heart failure 07/10/2020 No Yes Inactive Problems Resolved Problems Electronic Signature(s) Signed: 07/24/2020 6:08:30 PM By: Baltazar Najjar MD Entered By: Baltazar Najjar on 07/24/2020 13:17:15 -------------------------------------------------------------------------------- Progress Note Details Patient Name: Date of Service: Isabel Lopes, Isabel [name] 07/24/2020 12:45 PM Medical Record Number: [id number] Patient Account Number: [id number] Date of Birth/Sex: Treating RN: [birth year] (79 y.o. Isabel Barnes Primary Care Provider: HEDGECO CK, SUZA NNE Other Clinician: Referring Provider: Treating Provider/Extender: Baltazar Najjar HEDGECO CK, SUZA NNE Weeks in Treatment: 2 Subjective History of Present Illness (HPI) ADMISSION 04/12/2019 This is a pleasant 79 year old woman who comes in referred by her primary physician for review of a reopening of an original wound on the right lateral calf. The original wound was trauma against her dishwasher door on January 24. She was seen in an urgent care locally she was not sutured however she did get compression wraps and then this healed over. She states about a month ago there was increased swelling in the area reopened. She is here for our review of this. 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 Her past  history is a bit complicated as well. She had a right total knee replacement 2 years ago and  has had edema in the right greater than left leg since. She had a duplex ultrasound of the right leg 2 years ago that was negative for a DVT However she continues to have significant edema in the right greater than . left lower leg. She started to develop erythema in the right leg about a month ago although there is no pain unless she touches the area. She was put on clindamycin yesterday by her primary doctor. She also tells me that she has heart murmurs for many years and she has been referred by her primary doctor to cardiology but she does not yet have an appointment. She takes Lasix 20 mg a day. Past medical history includes hyperthyroidism, hypertension, widespread arthritis probably osteoarthritis, right total knee replacement, neck surgery, low back surgery, chronic pain. ABI in our clinic was 1.17 on the right 5/21-Patient was initiated on treatment at the wound center last week for a nonhealing right calf wound. 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 She was placed in 3 layer compression with silver alginate dressings and ABD on the wounds last week.Ulcer looks smaller 5/28; 3 layer compression with silver alginate to continue. Duplex ultrasound study ordered last week were apparently negative according to the patient although she had this done at a [facility] imaging site and I do not see the results in care everywhere. In any case they apparently saw a an area of superficial phlebitis on the right thigh and she has been using topical NSAIDs [Voltaren] and she states that area feels a lot better. She is going for an echocardiogram on June 4 and sees the cardiologist the same day 6/4; using 3 layer compression with silver alginate. Much better edema control still a small open area. She brought in her ultrasound from last week which showed a age indeterminant occlusive superficial thrombophlebitis involving the greater saphenous vein at the level of  the proximal thigh. The comment was made that there was no extension of this to the deep system. Presumably not felt to be at risk for deep vein migration. She also had her echocardiogram. I did not go over all of this in detail although she has a vigorous EF of 65%. Her right ventricular systolic function was normal. There was no increased right ventricular wall thickness. Right ventricular systolic pressure was mildly elevated at 38. She did not have significant valve disease. She had moderate thickening of the mitral valve but without measurable stenosis It would appear that her edema is not well explained by the duplex ultrasounds or her echocardiogram. I would wonder about venous imaging in her pelvis. Her edema was mostly pitting. 6/11; almost healed today. Surface eschar removed small open area remaining she should be healed by next week. I changed her to calcium alginate under 3 layer compression 6/18; the patient is healed today. Fully epithelialized. This was initially trauma in the setting of chronic venous insufficiency. At that time she had quite a bit of swelling in her leg pitting edema. Some of this was probably secondary to chronic venous insufficiency however was not sure we could blame all of this. She was put on some diuretics by enlarge the edema has resolved READMISSION 07/10/2020 -Patient presents with wound to the left lateral calf that started out when she tripped and fell with laceration wound that happened about 2  weeks ago, she was seen by primary who prescribed some dressings including Xeroform patient had been using silver gel more lately she also finished a course of doxycycline and was referred to the wound clinic for further care She also had a small water blister on the right lateral calf that burst and led to a small open area Patient is ABIs today in the clinic include 81 on the right and 0.96 on the left Patient denies any fevers chills, denies any excessive  swelling in her legs other than baseline, she uses a lymphedema pumps regularly every day, she also has foot problems with diminished pads and heel pain she got injected in the right heel for plantar fasciitis, History significant for hypertension, lymphedema, recent onset of recurrent falls and episodes of syncope from sitting to standing position 07/17/2020; patient was readmitted to the clinic after a difficult fall resulting in wounds to her bilateral lower extremities. She states this occurred in the setting of a bathroom renovation she is doing in her house. She has a fairly significant deep punched out area on the left upper lateral lower leg a much smaller and benign looking area on the right lower leg. We put her in compression and use silver alginate bilaterally 8/26; the wounds on her right leg are closed. She has her new Farrow wraps to put on today. On the left she has an open area on the left lateral upper calf this is come down in size and looks quite good. We have been using silver alginate under compression Objective Constitutional Patient is hypertensive.. Pulse regular and within target range for patient.Marland Kitchen Respirations regular, non-labored and within target range.. Temperature is normal and within the target range for the patient.Marland Kitchen Appears in no distress. Vitals Time Taken: 12:58 PM, Height: 62 in, Weight: 135 lbs, BMI: 24.7, Temperature: 97.9 F, Pulse: 91 bpm, Respiratory Rate: 18 breaths/min, Blood Pressure: 155/67 mmHg. Cardiovascular Pedal pulses are palpable. Excellent edema control bilaterally. General Notes: Wound exam; the area on the right leg is closed over. ooThe left lateral calf has a healthy looking granulated base it is come down in dimensions. No evidence of infection Integumentary (Hair, Skin) Wound #2 status is Open. Original cause of wound was Trauma. The wound is located on the Left,Lateral Lower Leg. The wound measures 1.1cm length x 1.5cm width x 0.2cm  depth; 1.296cm^2 area and 0.259cm^3 volume. There is Fat Layer (Subcutaneous Tissue) exposed. There is no tunneling or undermining noted. There is a medium amount of serosanguineous drainage noted. The wound margin is distinct with the outline attached to the wound base. There is medium (34-66%) pink granulation within the wound bed. There is a medium (34-66%) amount of necrotic tissue within the wound bed including Adherent Slough. Wound #3 status is Healed - Epithelialized. Original cause of wound was Blister. The wound is located on the Right,Lateral Lower Leg. The wound measures 0cm length x 0cm width x 0cm depth; 0cm^2 area and 0cm^3 volume. There is no tunneling or undermining noted. There is a none present amount of drainage noted. The wound margin is distinct with the outline attached to the wound base. There is no granulation within the wound bed. There is no necrotic tissue within the wound bed. Assessment Active Problems ICD-10 Non-pressure chronic ulcer of left calf with fat layer exposed Non-pressure chronic ulcer of unspecified ankle with fat layer exposed Lymphedema, not elsewhere classified Unspecified systolic (congestive) heart failure Procedures Wound #2 Pre-procedure diagnosis of Wound #2 is a Venous Leg  Ulcer located on the Left,Lateral Lower Leg . There was a Four Layer Compression Therapy Procedure by Zandra AbtsLynch, Shatara, RN. Post procedure Diagnosis Wound #2: Same as Pre-Procedure Plan Follow-up Appointments: Return Appointment in 1 week. Dressing Change Frequency: Wound #2 Left,Lateral Lower Leg: Do not change entire dressing for one week. Skin Barriers/Peri-Wound Care: Moisturizing lotion Wound Cleansing: Wound #2 Left,Lateral Lower Leg: May shower with protection. - use cast protector Primary Wound Dressing: Wound #2 Left,Lateral Lower Leg: Silver Collagen Secondary Dressing: Wound #2 Left,Lateral Lower Leg: Dry Gauze ABD pad Edema Control: 4 layer  compression: Left lower extremity Avoid standing for long periods of time Elevate legs to the level of the heart or above for 30 minutes daily and/or when sitting, a frequency of: - throughout the day Exercise regularly Support Garment 20-30 mm/Hg pressure to: - Farrow wrap to right leg daily, apply first thing in the morning, remove at night before bed Segmental Compressive Device. - lymphedema pumps twice a day for 1 hour each time 1. Continue with silver collagen to the left lateral lower leg ABDs under 4-layer compression 2. This wound should be closed by the next time she is here 3. Right leg is closed and she is transitioning into her external compression garments/Farrow wraps. Cautioned to lubricate the skin nightly Electronic Signature(s) Signed: 07/24/2020 6:08:30 PM By: Baltazar Najjarobson, Cataldo Cosgriff MD Entered By: Baltazar Najjarobson, Esme Freund on 07/24/2020 13:21:11 -------------------------------------------------------------------------------- SuperBill Details Patient Name: Date of Service: Isabel LopesPO RTER, Isabel [name] 07/24/2020 Medical Record Number: [id number] Patient Account Number: [id number] Date of Birth/Sex: Treating RN: 12/21/1940 (79 y.o. Isabel LinkF) Lynch, Shatara Primary Care Provider: HEDGECO CK, SUZA NNE Other Clinician: Referring Provider: Treating Provider/Extender: Baltazar Najjarobson, Cory Rama HEDGECO CK, SUZA NNE Weeks in Treatment: 2 Diagnosis Coding ICD-10 Codes Code Description [phone] Non-pressure chronic ulcer of left calf with fat layer exposed L97.302 Non-pressure chronic ulcer of unspecified ankle with fat layer exposed I89.0 Lymphedema, not elsewhere classified I50.20 Unspecified systolic (congestive) heart failure Facility Procedures CPT4 Code: [id number] Description: (Facility Use Only) [id number] - APPLY MULTLAY COMPRS LWR LT LEG Modifier: Quantity: 1 Physician Procedures : CPT4 Code Description Modifier [id number] - WC PHYS LEVEL 3 - EST PT ICD-10 Diagnosis Description L97.222 Non-pressure  chronic ulcer of left calf with fat layer exposed L97.302 Non-pressure chronic ulcer of unspecified ankle with fat layer exposed  I89.0 Lymphedema, not elsewhere classified Quantity: 1 Electronic Signature(s) Signed: 07/24/2020 4:47:33 PM By: Zandra AbtsLynch, Shatara RN, BSN Signed: 07/24/2020 6:08:30 PM By: Baltazar Najjarobson, Lashica Hannay MD Entered By: Zandra AbtsLynch, Shatara on 07/24/2020 13:22:25

## 2020-07-26 NOTE — Progress Notes (Signed)
Clayburn PertORTER, Lether W. ([id number]) Visit Report for 07/17/2020 Arrival Information Details Patient Name: [name] 07/17/2020 3:15 PM Medical Record Number: [id number] Patient Account Number: [id number] Date of Birth/Sex: Treating RN: 01/07/1941 (79 y.o. Harvest DarkF) Dwiggins, Shannon Primary Care Jirah Rider: HEDGECO CK, SUZA NNE Other Clinician: Referring Taevon Aschoff: Treating Wilmot Quevedo/Extender: Baltazar Najjarobson, Michael HEDGECO CK, SUZA NNE Weeks in Treatment: 1 Visit Information History Since Last Visit Added or deleted any medications: No Patient Arrived: Gilmer MorCane Any new allergies or adverse reactions: No Arrival Time: 15:34 Had a fall or experienced change in No Accompanied By: self activities of daily living that may affect Transfer Assistance: None risk of falls: Patient Identification Verified: Yes Signs or symptoms of abuse/neglect since last visito No Secondary Verification Process Completed: Yes Hospitalized since last visit: No Patient Has Alerts: Yes Implantable device outside of the clinic excluding No Patient Alerts: Patient on Blood Thinner cellular tissue based products placed in the center Left ABI: 0.96 since last visit: Right ABI: 1.17 Has Dressing in Place as Prescribed: Yes Has Compression in Place as Prescribed: Yes Pain Present Now: No Electronic Signature(s) Signed: 07/17/2020 5:22:44 PM By: Cherylin Mylarwiggins, Shannon Entered By: Cherylin Mylarwiggins, Shannon on 07/17/2020 15:34:17 -------------------------------------------------------------------------------- Compression Therapy Details Patient Name: Date of Service: [name] 07/17/2020 3:15 PM Medical Record Number: [id number] Patient Account Number: [id number] Date of Birth/Sex: Treating RN: 09/09/1941 (79 y.o. Wynelle LinkF) Lynch, Shatara Primary Care Mardel Grudzien: HEDGECO CK, SUZA NNE Other Clinician: Referring Fredie Majano: Treating Stephane Niemann/Extender: Baltazar Najjarobson, Michael HEDGECO CK, SUZA NNE Weeks in Treatment: 1 Compression  Therapy Performed for Wound Assessment: Wound #2 Left,Lateral Lower Leg Performed By: Clinician Zandra AbtsLynch, Shatara, RN Compression Type: Four Layer Post Procedure Diagnosis Same as Pre-procedure Electronic Signature(s) Signed: 07/17/2020 5:36:57 PM By: Zandra AbtsLynch, Shatara RN, BSN Entered By: Zandra AbtsLynch, Shatara on 07/17/2020 16:24:04 -------------------------------------------------------------------------------- Compression Therapy Details Patient Name: Date of Service: Berline LopesPO RTER, [name] 07/17/2020 3:15 PM Medical Record Number: [id number] Patient Account Number: [id number] Date of Birth/Sex: Treating RN: 08/11/1941 (79 y.o. Wynelle LinkF) Lynch, Shatara Primary Care Velinda Wrobel: HEDGECO CK, SUZA NNE Other Clinician: Referring Viraj Liby: Treating Marguis Mathieson/Extender: Baltazar Najjarobson, Michael HEDGECO CK, SUZA NNE Weeks in Treatment: 1 Compression Therapy Performed for Wound Assessment: Wound #3 Right,Lateral Lower Leg Performed By: Clinician Zandra AbtsLynch, Shatara, RN Compression Type: Four Layer Post Procedure Diagnosis Same as Pre-procedure Electronic Signature(s) Signed: 07/17/2020 5:36:57 PM By: Zandra AbtsLynch, Shatara RN, BSN Entered By: Zandra AbtsLynch, Shatara on 07/17/2020 16:24:05 -------------------------------------------------------------------------------- Encounter Discharge Information Details Patient Name: Date of Service: Berline [place]PO RTER, [name] 07/17/2020 3:15 PM Medical Record Number: [id number] Patient Account Number: [id number] Date of Birth/Sex: Treating RN: 01/04/1941 ([age] y.o. Freddy FinnerF) Epps, Carrie Primary Care Tangie Stay: HEDGECO CK, SUZA NNE Other Clinician: Referring Rosalinda Seaman: Treating Anthonella Klausner/Extender: Baltazar Najjarobson, Michael HEDGECO CK, SUZA NNE Weeks in Treatment: 1 Encounter Discharge Information Items Post Procedure Vitals Discharge Condition: Stable Temperature (F): 98.4 Ambulatory Status: Cane Pulse (bpm): 88 Discharge Destination: Home Respiratory Rate (breaths/min): 18 Transportation: Private Auto Blood Pressure  (mmHg): 118/54 Accompanied By: self Schedule Follow-up Appointment: Yes Clinical Summary of Care: Patient Declined Electronic Signature(s) Signed: 07/25/2020 5:50:16 PM By: Yevonne PaxEpps, Carrie RN Entered By: Yevonne PaxEpps, Carrie on 07/17/2020 16:49:13 -------------------------------------------------------------------------------- Lower Extremity Assessment Details Patient Name: Date of Service: [place]line [place], [place] [name] 07/17/2020 3:15 PM Medical Record Number: [id number] Patient Account Number: [id number] Date of Birth/Sex: Treating RN: 10/03/1941 (79 y.o. Harvest DarkF) Dwiggins, Shannon Primary Care Aadin Gaut: HEDGECO CK, SUZA NNE Other Clinician: Referring Ceylon Arenson: Treating Arvis Miguez/Extender: Baltazar Najjarobson, Michael HEDGECO CK, SUZA NNE Weeks in Treatment: 1  Edema Assessment Assessed: [Left: No] [Right: No] E[Left: dema] [Right: :] Calf Left: Right: Point of Measurement: 32 cm From Medial Instep 35 cm 35.5 cm Ankle Left: Right: Point of Measurement: 13 cm From Medial Instep 19.5 cm 21 cm Vascular Assessment Pulses: Dorsalis Pedis Palpable: [Left:Yes] [Right:Yes] Electronic Signature(s) Signed: 07/17/2020 5:22:44 PM By: Cherylin Mylar Entered By: Cherylin Mylar on 07/17/2020 15:46:02 -------------------------------------------------------------------------------- Multi Wound Chart Details Patient Name: Date of Service: [name], [name] 07/17/2020 3:15 PM Medical Record Number: [id number] Patient Account Number: [id number] Date of Birth/Sex: Treating RN: 1941/07/08 (79 y.o. [name] Primary Care Shiara Mcgough: HEDGECO CK, SUZA NNE Other Clinician: Referring Marvis Bakken: Treating Nevaeh Korte/Extender: Baltazar Najjar HEDGECO CK, SUZA NNE Weeks in Treatment: 1 Vital Signs Height(in): 62 Pulse(bpm): 88 Weight(lbs): 135 Blood Pressure(mmHg): 118/54 Body Mass Index(BMI): 25 Temperature(F): 98.4 Respiratory Rate(breaths/min): 18 Photos: [2:No Photos Left, Lateral Lower Leg] [3:No Photos Right,  Lateral Lower Leg] [N/A:N/A N/A] Wound Location: [2:Trauma] [3:Blister] [N/A:N/A] Wounding Event: [2:Venous Leg Ulcer] [3:Venous Leg Ulcer] [N/A:N/A] Primary Etiology: [2:Lymphedema] [3:Lymphedema] [N/A:N/A] Secondary Etiology: [2:Cataracts, Chronic sinus] [3:Cataracts, Chronic sinus] [N/A:N/A] Comorbid History: [2:problems/congestion, Anemia, Hypertension, Peripheral Venous Disease, Osteoarthritis, Confinement Anxiety 06/10/2020] [3:problems/congestion, Anemia, Hypertension, Peripheral Venous Disease, Osteoarthritis, Confinement Anxiety  06/26/2020] [N/A:N/A] Date Acquired: [2:1] [3:1] [N/A:N/A] Weeks of Treatment: [2:Open] [3:Open] [N/A:N/A] Wound Status: [2:3x2x0.6] [3:0.2x0.2x0.2] [N/A:N/A] Measurements L x W x D (cm) [2:4.712] [3:0.031] [N/A:N/A] A (cm) : rea [2:2.827] [3:0.006] [N/A:N/A] Volume (cm) : [2:64.90%] [3:99.50%] [N/A:N/A] % Reduction in A rea: [2:79.00%] [3:99.00%] [N/A:N/A] % Reduction in Volume: [2:12] Starting Position 1 (o'clock): [2:1] Ending Position 1 (o'clock): [2:0.4] Maximum Distance 1 (cm): [2:Yes] [3:No] [N/A:N/A] Undermining: [2:Full Thickness Without Exposed] [3:Full Thickness Without Exposed] [N/A:N/A] Classification: [2:Support Structures Medium] [3:Support Structures Small] [N/A:N/A] Exudate Amount: [2:Serosanguineous] [3:Serosanguineous] [N/A:N/A] Exudate Type: [2:red, brown] [3:red, brown] [N/A:N/A] Exudate Color: [2:Distinct, outline attached] [3:Distinct, outline attached] [N/A:N/A] Wound Margin: [2:Medium (34-66%)] [3:Large (67-100%)] [N/A:N/A] Granulation Amount: [2:Pink] [3:Pink] [N/A:N/A] Granulation Quality: [2:Medium (34-66%)] [3:None Present (0%)] [N/A:N/A] Necrotic Amount: [2:Fat Layer (Subcutaneous Tissue): Yes Fat Layer (Subcutaneous Tissue): Yes N/A] Exposed Structures: [2:Fascia: No Tendon: No Muscle: No Joint: No Bone: No Small (1-33%)] [3:Fascia: No Tendon: No Muscle: No Joint: No Bone: No Large (67-100%)] [N/A:N/A] Epithelialization:  [2:Debridement - Excisional] [3:Chemical/Enzymatic/Mechanical -] [N/A:N/A] Debridement: [3:Excisional] Pre-procedure Verification/Time Out 16:20 [3:N/A] [N/A:N/A] Taken: [2:Subcutaneous, Slough] [3:Subcutaneous, Slough] [N/A:N/A] Tissue Debrided: [2:Skin/Subcutaneous Tissue] [3:N/A] [N/A:N/A] Level: [2:6] [3:N/A] [N/A:N/A] Debridement A (sq cm): [2:rea Curette] [3:N/A] [N/A:N/A] Instrument: [2:Minimum] [3:Minimum] [N/A:N/A] Bleeding: [2:Pressure] [3:Pressure] [N/A:N/A] Hemostasis A chieved: [2:0] [3:0] [N/A:N/A] Procedural Pain: [2:0] [3:0] [N/A:N/A] Post Procedural Pain: [2:Procedure was tolerated well] [3:Procedure was tolerated well] [N/A:N/A] Debridement Treatment Response: [2:3x2x0.6] [3:0.2x0.2x0.2] [N/A:N/A] Post Debridement Measurements L x W x D (cm) [2:2.827] [3:0.006] [N/A:N/A] Post Debridement Volume: (cm) [2:Compression Therapy] [3:Compression Therapy] [N/A:N/A] Procedures Performed: [2:Debridement] [3:Debridement] Treatment Notes Wound #2 (Left, Lateral Lower Leg) 1. Cleanse With Wound Cleanser 3. Primary Dressing Applied Collegen AG 4. Secondary Dressing Dry Gauze Foam 6. Support Layer Applied 4 layer compression wrap Notes netting Wound #3 (Right, Lateral Lower Leg) 1. Cleanse With Wound Cleanser 3. Primary Dressing Applied Collegen AG 4. Secondary Dressing Dry Gauze Foam 6. Support Layer Applied 4 layer compression wrap Notes netting Electronic Signature(s) Signed: 07/17/2020 5:36:57 PM By: Zandra Abts RN, BSN Signed: 07/17/2020 5:57:44 PM By: Baltazar Najjar MD Entered By: Baltazar Najjar on 07/17/2020 17:30:06 -------------------------------------------------------------------------------- Multi-Disciplinary Care Plan Details Patient Name: Date of Service: [name], [place] [name] 07/17/2020 3:15 PM Medical Record Number: [id number] Patient  Account Number: [id number] Date of Birth/Sex: Treating RN: 02-20-1941 (78 y.o. [name] Primary Care  Asja Frommer: HEDGECO CK, SUZA NNE Other Clinician: Referring Racine Erby: Treating Graylin Sperling/Extender: Baltazar Najjar HEDGECO CK, SUZA NNE Weeks in Treatment: 1 Active Inactive Abuse / Safety / Falls / Self Care Management Nursing Diagnoses: Potential for falls Potential for injury related to falls Goals: Patient will remain injury free related to falls Date Initiated: 07/10/2020 Target Resolution Date: 08/08/2020 Goal Status: Active Patient/caregiver will verbalize/demonstrate measures taken to prevent injury and/or falls Date Initiated: 07/10/2020 Target Resolution Date: 08/08/2020 Goal Status: Active Interventions: Assess Activities of Daily Living upon admission and as needed Assess fall risk on admission and as needed Assess: immobility, friction, shearing, incontinence upon admission and as needed Assess impairment of mobility on admission and as needed per policy Assess personal safety and home safety (as indicated) on admission and as needed Assess self care needs on admission and as needed Provide education on fall prevention Provide education on personal and home safety 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 Compression as ordered Provide education on venous insufficiency Notes: Wound/Skin Impairment Nursing Diagnoses: Impaired tissue integrity Knowledge deficit related to ulceration/compromised skin integrity Goals: Patient/caregiver will verbalize understanding of skin care regimen Date Initiated: 07/10/2020 Target Resolution Date: 08/08/2020 Goal Status: Active Interventions: Assess patient/caregiver ability to obtain necessary supplies Assess  patient/caregiver ability to perform ulcer/skin care regimen upon admission and as needed Assess ulceration(s) every visit Provide education on ulcer and skin care Notes: Electronic Signature(s) Signed: 07/17/2020 5:36:57 PM By: Zandra Abts RN, BSN Entered By: Zandra Abts on 07/17/2020 17:23:07 -------------------------------------------------------------------------------- Pain Assessment Details Patient Name: Date of Service: [name], [name] 07/17/2020 3:15 PM Medical Record Number: [id number] Patient Account Number: [id number] Date of Birth/Sex: Treating RN: 18-Nov-1941 (79 y.o. [name] Primary Care Laylah Riga: HEDGECO CK, SUZA NNE Other Clinician: Referring Kloee Ballew: Treating Rakayla Ricklefs/Extender: Baltazar Najjar HEDGECO CK, SUZA NNE Weeks in Treatment: 1 Active Problems Location of Pain Severity and Description of Pain Patient Has Paino No Site Locations Pain Management and Medication Current Pain Management: Electronic Signature(s) Signed: 07/17/2020 5:22:44 PM By: Cherylin Mylar Entered By: Cherylin Mylar on 07/17/2020 15:45:37 -------------------------------------------------------------------------------- Patient/Caregiver Education Details Patient Name: Date of Service: [address], [place] 8/19/2021andnbsp3:15 PM Medical Record Number: [id number] Patient Account Number: [id number] Date of Birth/Gender: Treating RN: 1941/11/06 (79 y.o. [name] Primary Care Physician: HEDGECO CK, SUZA NNE Other Clinician: Referring Physician: Treating Physician/Extender: Baltazar Najjar HEDGECO CK, SUZA NNE Weeks in Treatment: 1 Education Assessment Education Provided To: Patient Education Topics Provided Safety: Methods: Explain/Verbal Responses: State content correctly Venous: Methods: Explain/Verbal Responses: State content correctly Wound/Skin Impairment: Methods: Explain/Verbal Responses: State content correctly Electronic  Signature(s) Signed: 07/17/2020 5:36:57 PM By: Zandra Abts RN, BSN Entered By: Zandra Abts on 07/17/2020 17:23:21 -------------------------------------------------------------------------------- Wound Assessment Details Patient Name: Date of Service: [name], [name] 07/17/2020 3:15 PM Medical Record Number: [id number] Patient Account Number: [id number] Date of Birth/Sex: Treating RN: Oct 12, 1941 (79 y.o. 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, Lateral Lower Leg Secondary Lymphedema Wounding Event: Trauma Etiology:  Date Acquired: 06/10/2020 Wound Open Weeks Of Treatment: 1 Status: Clustered Wound: No Comorbid Cataracts, Chronic sinus problems/congestion, Anemia, History: Hypertension, Peripheral Venous Disease, Osteoarthritis, Confinement Anxiety Photos Photo Uploaded By: Benjaman Kindler on 07/21/2020 12:08:38 Wound Measurements Length: (cm) 3 Width: (cm) 2 Depth: (cm) 0.6 Area: (cm) 4.712 Volume: (cm) 2.827 % Reduction in Area: 64.9% % Reduction in Volume: 79% Epithelialization: Small (1-33%) Tunneling: No Undermining: Yes Starting Position (o'clock): 12 Ending Position (o'clock): 1 Maximum Distance: (cm) 0.4 Wound Description Classification: Full Thickness Without Exposed Support Structures Wound Margin: Distinct, outline attached Exudate Amount: Medium Exudate Type: Serosanguineous Exudate Color: red, brown Foul Odor After Cleansing: No Slough/Fibrino Yes Wound Bed Granulation Amount: Medium (34-66%) Exposed Structure Granulation Quality: Pink Fascia Exposed: No Necrotic Amount: Medium (34-66%) Fat Layer (Subcutaneous Tissue) Exposed: Yes Necrotic Quality: Adherent Slough Tendon Exposed: No Muscle Exposed: No Joint  Exposed: No Bone Exposed: No Electronic Signature(s) Signed: 07/17/2020 5:36:57 PM By: Zandra Abts RN, BSN Entered By: Zandra Abts on 07/17/2020 16:22:13 -------------------------------------------------------------------------------- Wound Assessment Details Patient Name: Date of Service: [name], [name] 07/17/2020 3:15 PM Medical Record Number: [id number] Patient Account Number: [id number] Date of Birth/Sex: Treating RN: 11-13-1941 (79 y.o. [name] Primary Care Lielle Vandervort: HEDGECO CK, SUZA NNE Other Clinician: Referring Myleka Moncure: Treating Jordan Pardini/Extender: Baltazar Najjar HEDGECO CK, SUZA NNE Weeks in Treatment: 1 Wound Status Wound Number: 3 Primary Venous Leg Ulcer Etiology: Wound Location: Right, Lateral Lower Leg Secondary Lymphedema Wounding Event: Blister Etiology: Date Acquired: 06/26/2020 Wound Open Weeks Of Treatment: 1 Status: Clustered Wound: No Comorbid Cataracts, Chronic sinus problems/congestion, Anemia, History: Hypertension, Peripheral Venous Disease, Osteoarthritis, Confinement Anxiety Photos Photo Uploaded By: Benjaman Kindler on 07/21/2020 12:08:38 Wound Measurements Length: (cm) 0.2 Width: (cm) 0.2 Depth: (cm) 0.2 Area: (cm) 0.031 Volume: (cm) 0.006 % Reduction in Area: 99.5% % Reduction in Volume: 99% Epithelialization: Large (67-100%) Tunneling: No Undermining: No Wound Description Classification: Full Thickness Without Exposed Support Structures Wound Margin: Distinct, outline attached Exudate Amount: Small Exudate Type: Serosanguineous Exudate Color: red, brown Foul Odor After Cleansing: No Slough/Fibrino No Wound Bed Granulation Amount: Large (67-100%) Exposed Structure Granulation Quality: Pink Fascia Exposed: No Necrotic Amount: None Present (0%) Fat Layer (Subcutaneous Tissue) Exposed: Yes Tendon Exposed: No Muscle Exposed: No Joint Exposed: No Bone Exposed: No Electronic Signature(s) Signed: 07/17/2020  5:36:57 PM By: Zandra Abts RN, BSN Entered By: Zandra Abts on 07/17/2020 16:22:47 -------------------------------------------------------------------------------- Vitals Details Patient Name: Date of Service: [name], [name] 07/17/2020 3:15 PM Medical Record Number: [id number] Patient Account Number: [id number] Date of Birth/Sex: Treating RN: December 02, 1940 (79 y.o. [name] Primary Care Quintavious Rinck: HEDGECO CK, SUZA NNE Other Clinician: Referring Yemariam Magar: Treating Harlym Gehling/Extender: Baltazar Najjar HEDGECO CK, SUZA NNE Weeks in Treatment: 1 Vital Signs Time Taken: 15:35 Temperature (F): 98.4 Height (in): 62 Pulse (bpm): 88 Weight (lbs): 135 Respiratory Rate (breaths/min): 18 Body Mass Index (BMI): 24.7 Blood Pressure (mmHg): 118/54 Reference Range: 80 - 120 mg / dl Electronic Signature(s) Signed: 07/17/2020 5:22:44 PM By: Cherylin Mylar Entered By: Cherylin Mylar on 07/17/2020 15:45:29

## 2020-07-26 NOTE — Progress Notes (Signed)
Isabel Barnes, Isabel Barnes ([id number]) Visit Report for 07/24/2020 Arrival Information Details Patient Name: Date of Service: Isabel Barnes, [name] 07/24/2020 12:45 PM Medical Record Number: [id number] Patient Account Number: [id number] Date of Birth/Sex: Treating RN: 1941-01-09 (79 y.o. [name] Primary Care Hitomi Slape: HEDGECO CK, SUZA NNE Other Clinician: Referring Dalayza Zambrana: Treating Rodel Glaspy/Extender: Baltazar Najjar HEDGECO CK, SUZA NNE Weeks in Treatment: 2 Visit Information History Since Last Visit All ordered tests and consults were completed: No Patient Arrived: Ambulatory Added or deleted any medications: No Arrival Time: 12:56 Any new allergies or adverse reactions: No Accompanied By: self Had a fall or experienced change in No Transfer Assistance: None activities of daily living that may affect Patient Identification Verified: Yes risk of falls: Secondary Verification Process Completed: Yes Signs or symptoms of abuse/neglect since last visito No Patient Has Alerts: Yes Hospitalized since last visit: No Patient Alerts: Patient on Blood Thinner Implantable device outside of the clinic excluding No Left ABI: 0.96 cellular tissue based products placed in the center Right ABI: 1.17 since last visit: Has Dressing in Place as Prescribed: Yes Has Compression in Place as Prescribed: Yes Pain Present Now: No Electronic Signature(s) Signed: 07/25/2020 5:49:17 PM By: Yevonne Pax RN Entered By: Yevonne Pax on 07/24/2020 12:58:05 -------------------------------------------------------------------------------- Compression Therapy Details Patient Name: Date of Service: Isabel Barnes, [name] 07/24/2020 12:45 PM Medical Record Number: [id number] Patient Account Number: [id number] Date of Birth/Sex: Treating RN: August 24, 1941 (79 y.o. [name] Primary Care Dionisios Ricci: HEDGECO CK, SUZA NNE Other Clinician: Referring Noela Brothers: Treating Kelsa Jaworowski/Extender: Baltazar Najjar HEDGECO  CK, SUZA NNE Weeks in Treatment: 2 Compression Therapy Performed for Wound Assessment: Wound #2 Left,Lateral Lower Leg Performed By: Clinician Zandra Abts, RN Compression Type: Four Layer Post Procedure Diagnosis Same as Pre-procedure Electronic Signature(s) Signed: 07/24/2020 4:47:33 PM By: Zandra Abts RN, BSN Entered By: Zandra Abts on 07/24/2020 13:19:28 -------------------------------------------------------------------------------- Encounter Discharge Information Details Patient Name: Date of Service: Isabel Lopes, [name] 07/24/2020 12:45 PM Medical Record Number: [id number] Patient Account Number: [id number] Date of Birth/Sex: Treating RN: 04/08/1941 (79 y.o. Tommye Standard Primary Care Birdena Kingma: HEDGECO CK, SUZA NNE Other Clinician: Referring Kincaid Tiger: Treating Adhvik Canady/Extender: Baltazar Najjar HEDGECO CK, SUZA NNE Weeks in Treatment: 2 Encounter Discharge Information Items Discharge Condition: Stable Ambulatory Status: Cane Discharge Destination: Home Transportation: Private Auto Accompanied By: self Schedule Follow-up Appointment: Yes Clinical Summary of Care: Patient Declined Electronic Signature(s) Signed: 07/25/2020 6:00:28 PM By: Zenaida Deed RN, BSN Entered By: Zenaida Deed on 07/24/2020 13:37:47 -------------------------------------------------------------------------------- Lower Extremity Assessment Details Patient Name: Date of Service: Isabel Barnes, [name] 07/24/2020 12:45 PM Medical Record Number: [id number] Patient Account Number: [id number] Date of Birth/Sex: Treating RN: 19-Jan-1941 (79 y.o. [name] Primary Care Ashleah Valtierra: HEDGECO CK, SUZA NNE Other Clinician: Referring Rohail Klees: Treating Tiernan Millikin/Extender: Baltazar Najjar HEDGECO CK, SUZA NNE Weeks in Treatment: 2 Edema Assessment Assessed: [Left: No] [Right: No] E[Left: dema] [Right: :] Calf Left: Right: Point of Measurement: 32 cm From Medial Instep 32 cm  cm Ankle Left: Right: Point of Measurement: 13 cm From Medial Instep 19 cm cm Electronic Signature(s) Signed: 07/25/2020 5:49:17 PM By: Yevonne Pax RN Entered By: Yevonne Pax on 07/24/2020 13:02:27 -------------------------------------------------------------------------------- Multi Wound Chart Details Patient Name: Date of Service: Isabel Lopes, [name] 07/24/2020 12:45 PM Medical Record Number: [id number] Patient Account Number: [id number] Date of Birth/Sex: Treating RN: 01-May-1941 (79 y.o. [name] Primary Care Nyeshia Mysliwiec: HEDGECO CK, SUZA NNE Other Clinician: Referring Tilman Mcclaren: Treating Mirabel Ahlgren/Extender: Baltazar Najjar HEDGECO CK, SUZA  NNE Weeks in Treatment: 2 Vital Signs Height(in): 62 Pulse(bpm): 91 Weight(lbs): 135 Blood Pressure(mmHg): 155/67 Body Mass Index(BMI): 25 Temperature(F): 97.9 Respiratory Rate(breaths/min): 18 Photos: [2:No Photos Left, Lateral Lower Leg] [3:No Photos Right, Lateral Lower Leg] [N/A:N/A N/A] Wound Location: [2:Trauma] [3:Blister] [N/A:N/A] Wounding Event: [2:Venous Leg Ulcer] [3:Venous Leg Ulcer] [N/A:N/A] Primary Etiology: [2:Lymphedema] [3:Lymphedema] [N/A:N/A] Secondary Etiology: [2:Cataracts, Chronic sinus] [3:Cataracts, Chronic sinus] [N/A:N/A] Comorbid History: [2:problems/congestion, Anemia, Hypertension, Peripheral Venous Disease, Osteoarthritis, Confinement Disease, Osteoarthritis, Confinement Anxiety 06/10/2020] [3:problems/congestion, Anemia, Hypertension, Peripheral Venous Anxiety  06/26/2020] [N/A:N/A] Date Acquired: [2:2] [3:2] [N/A:N/A] Weeks of Treatment: [2:Open] [3:Healed - Epithelialized] [N/A:N/A] Wound Status: [2:1.1x1.5x0.2] [3:0x0x0] [N/A:N/A] Measurements L x W x D (cm) [2:1.296] [3:0] [N/A:N/A] A (cm) : rea [2:0.259] [3:0] [N/A:N/A] Volume (cm) : [2:90.30%] [3:100.00%] [N/A:N/A] % Reduction in Area: [2:98.10%] [3:100.00%] [N/A:N/A] % Reduction in Volume: [2:Full Thickness Without Exposed] [3:Full  Thickness Without Exposed] [N/A:N/A] Classification: [2:Support Structures Medium] [3:Support Structures None Present] [N/A:N/A] Exudate Amount: [2:Serosanguineous] [3:N/A] [N/A:N/A] Exudate Type: [2:red, brown] [3:N/A] [N/A:N/A] Exudate Color: [2:Distinct, outline attached] [3:Distinct, outline attached] [N/A:N/A] Wound Margin: [2:Medium (34-66%)] [3:None Present (0%)] [N/A:N/A] Granulation Amount: [2:Pink] [3:N/A] [N/A:N/A] Granulation Quality: [2:Medium (34-66%)] [3:None Present (0%)] [N/A:N/A] Necrotic Amount: [2:Fat Layer (Subcutaneous Tissue): Yes Fascia: No] [N/A:N/A] Exposed Structures: [2:Fascia: No Tendon: No Muscle: No Joint: No Bone: No Small (1-33%)] [3:Fat Layer (Subcutaneous Tissue): No Tendon: No Muscle: No Joint: No Bone: No Large (67-100%)] [N/A:N/A] Treatment Notes Electronic Signature(s) Signed: 07/24/2020 4:47:33 PM By: Zandra Abts RN, BSN Signed: 07/24/2020 6:08:30 PM By: Baltazar Najjar MD Entered By: Baltazar Najjar on 07/24/2020 13:17:21 -------------------------------------------------------------------------------- Multi-Disciplinary Care Plan Details Patient Name: Date of Service: Isabel Barnes, [place] [name] 07/24/2020 12:45 PM Medical Record Number: [id number] Patient Account Number: [id number] Date of Birth/Sex: Treating RN: 09-09-1941 (79 y.o. [name] Primary Care Tadd Holtmeyer: HEDGECO CK, SUZA NNE Other Clinician: Referring Natina Wiginton: Treating Rusty Glodowski/Extender: Baltazar Najjar HEDGECO CK, SUZA NNE Weeks in Treatment: 2 Active Inactive Abuse / Safety / Falls / Self Care Management Nursing Diagnoses: Potential for falls Potential for injury related to falls Goals: Patient will remain injury free related to falls Date Initiated: 07/10/2020 Target Resolution Date: 08/08/2020 Goal Status: Active Patient/caregiver will verbalize/demonstrate measures taken to prevent injury and/or falls Date Initiated: 07/10/2020 Target Resolution Date: 08/08/2020 Goal  Status: Active Interventions: Assess Activities of Daily Living upon admission and as needed Assess fall risk on admission and as needed Assess: immobility, friction, shearing, incontinence upon admission and as needed Assess impairment of mobility on admission and as needed per policy Assess personal safety and home safety (as indicated) on admission and as needed Assess self care needs on admission and as needed Provide education on fall prevention Provide education on personal and home safety 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 Compression as ordered Provide education on venous insufficiency Notes: Wound/Skin Impairment Nursing Diagnoses: Impaired tissue integrity Knowledge deficit related to ulceration/compromised skin integrity Goals: Patient/caregiver will verbalize understanding of skin care regimen Date Initiated: 07/10/2020 Target Resolution Date: 08/08/2020 Goal Status: Active Interventions: Assess patient/caregiver ability to obtain necessary supplies Assess patient/caregiver ability to perform ulcer/skin care regimen upon admission and as needed Assess ulceration(s) every visit Provide education on ulcer and skin care Notes: Electronic Signature(s) Signed: 07/24/2020 4:47:33 PM By: Zandra Abts RN, BSN  Entered By: Zandra Abts on 07/24/2020 13:23:13 -------------------------------------------------------------------------------- Pain Assessment Details Patient Name: Date of Service: Isabel Dora, [name] 07/24/2020 12:45 PM Medical Record Number: [id number] Patient Account Number: [id number] Date of Birth/Sex: Treating RN: January 16, 1941 (79 y.o. [name] Primary Care Vernell Back: HEDGECO CK, SUZA NNE Other Clinician: Referring Edvin Albus: Treating Leo Fray/Extender: Baltazar Najjar HEDGECO CK, SUZA NNE Weeks in Treatment: 2 Active Problems Location of Pain Severity and Description of Pain Patient Has Paino No Site Locations Pain Management and Medication Current Pain Management: Electronic Signature(s) Signed: 07/25/2020 5:49:17 PM By: Yevonne Pax RN Entered By: Yevonne Pax on 07/24/2020 12:58:59 -------------------------------------------------------------------------------- Patient/Caregiver Education Details Patient Name: Date of Service: Isabel Barnes, [place] 8/26/2021andnbsp12:45 PM Medical Record Number: [id number] Patient Account Number: [id number] Date of Birth/Gender: Treating RN: [birth year] (79 y.o. [name] Primary Care Physician: HEDGECO CK, SUZA NNE Other Clinician: Referring Physician: Treating Physician/Extender: Baltazar Najjar HEDGECO CK, SUZA NNE Weeks in Treatment: 2 Education Assessment Education Provided To: Patient Education Topics Provided Venous: Methods: Explain/Verbal Responses: State content correctly Wound/Skin Impairment: Methods: Explain/Verbal Responses: State content correctly Electronic Signature(s) Signed: 07/24/2020 4:47:33 PM By: Zandra Abts RN, BSN Entered By: Zandra Abts on 07/24/2020 13:23:27 -------------------------------------------------------------------------------- Wound Assessment Details Patient Name: Date of Service: Isabel Lopes, [name] 07/24/2020 12:45 PM Medical Record Number: [id number] Patient Account Number: [id number] Date of Birth/Sex: Treating RN: Aug 14, 1941 (79 y.o. [name] Primary Care Hershal Eriksson: HEDGECO CK, SUZA NNE Other Clinician: Referring Hadiyah Maricle: Treating Masey Scheiber/Extender: Baltazar Najjar HEDGECO CK, SUZA NNE Weeks in Treatment: 2 Wound Status Wound Number: 2 Primary Venous Leg Ulcer Etiology: Wound Location: Left,  Lateral Lower Leg Secondary Lymphedema Wounding Event: Trauma Etiology: Date Acquired: 06/10/2020 Wound Open Weeks Of Treatment: 2 Status: Clustered Wound: No Comorbid Cataracts, Chronic sinus problems/congestion, Anemia, History: Hypertension, Peripheral Venous Disease, Osteoarthritis, Confinement Anxiety Photos Photo Uploaded By: Benjaman Kindler on 07/25/2020 11:45:23 Wound Measurements Length: (cm) 1.1 Width: (cm) 1.5 Depth: (cm) 0.2 Area: (cm) 1.296 Volume: (cm) 0.259 % Reduction in Area: 90.3% % Reduction in Volume: 98.1% Epithelialization: Small (1-33%) Tunneling: No Undermining: No Wound Description Classification: Full Thickness Without Exposed Support Structures Wound Margin: Distinct, outline attached Exudate Amount: Medium Exudate Type: Serosanguineous Exudate Color: red, brown Foul Odor After Cleansing: No Slough/Fibrino Yes Wound Bed Granulation Amount: Medium (34-66%) Exposed Structure Granulation Quality: Pink Fascia Exposed: No Necrotic Amount: Medium (34-66%) Fat Layer (Subcutaneous Tissue) Exposed: Yes Necrotic Quality: Adherent Slough Tendon Exposed: No Muscle Exposed: No Joint Exposed: No Bone Exposed: No Treatment Notes Wound #2 (Left, Lateral Lower Leg) 2. Periwound Care Moisturizing lotion 3. Primary Dressing Applied Collegen AG 4. Secondary Dressing Dry Gauze 6. Support Layer Applied 4 layer compression wrap Notes netting Electronic Signature(s) Signed: 07/25/2020 5:49:17 PM By: Yevonne Pax RN Entered By: Yevonne Pax on 07/24/2020 13:04:07 -------------------------------------------------------------------------------- Wound Assessment Details Patient Name: Date of Service: Isabel Barnes, [name] 07/24/2020 12:45 PM Medical Record Number: [id number] Patient Account Number: [id number] Date of Birth/Sex: Treating RN: [birth year] (79 y.o. [name] Primary Care Nidhi Jacome: HEDGECO CK, SUZA NNE Other Clinician: Referring  Malaysha Arlen: Treating Lalo Tromp/Extender: Baltazar Najjar HEDGECO CK, SUZA NNE Weeks in Treatment: 2 Wound Status Wound Number: 3 Primary Venous Leg Ulcer Etiology: Wound Location: Right, Lateral Lower Leg Secondary Lymphedema Wounding Event: Blister Etiology: Date Acquired: 06/26/2020 Wound Healed - Epithelialized Weeks Of Treatment: 2 Status: Clustered Wound: No Comorbid Cataracts, Chronic sinus problems/congestion, Anemia, History: Hypertension, Peripheral Venous Disease, Osteoarthritis, Confinement Anxiety Photos Photo Uploaded ByBenjaman Kindler on 07/25/2020  11:45:24 Wound Measurements Length: (cm) Width: (cm) Depth: (cm) Area: (cm) Volume: (cm) 0 % Reduction in Area: 100% 0 % Reduction in Volume: 100% 0 Epithelialization: Large (67-100%) 0 Tunneling: No 0 Undermining: No Wound Description Classification: Full Thickness Without Exposed Support Structures Wound Margin: Distinct, outline attached Exudate Amount: None Present Foul Odor After Cleansing: No Slough/Fibrino No Wound Bed Granulation Amount: None Present (0%) Exposed Structure Necrotic Amount: None Present (0%) Fascia Exposed: No Fat Layer (Subcutaneous Tissue) Exposed: No Tendon Exposed: No Muscle Exposed: No Joint Exposed: No Bone Exposed: No Electronic Signature(s) Signed: 07/24/2020 4:47:33 PM By: Zandra AbtsLynch, Shatara RN, BSN Signed: 07/25/2020 5:49:17 PM By: Yevonne PaxEpps, Carrie RN Entered By: Zandra AbtsLynch, Shatara on 07/24/2020 13:15:52 -------------------------------------------------------------------------------- Vitals Details Patient Name: Date of Service: Isabel LopesPO RTER, [name] 07/24/2020 12:45 PM Medical Record Number: [id number] Patient Account Number: [id number] Date of Birth/Sex: Treating RN: 02/03/1941 (79 y.o. Freddy FinnerF) Epps, Carrie Primary Care Mackensie Pilson: HEDGECO CK, SUZA NNE Other Clinician: Referring Marcus Groll: Treating Virgie Chery/Extender: Baltazar Najjarobson, Michael HEDGECO CK, SUZA NNE Weeks in Treatment: 2 Vital  Signs Time Taken: 12:58 Temperature (F): 97.9 Height (in): 62 Pulse (bpm): 91 Weight (lbs): 135 Respiratory Rate (breaths/min): 18 Body Mass Index (BMI): 24.7 Blood Pressure (mmHg): 155/67 Reference Range: 80 - 120 mg / dl Electronic Signature(s) Signed: 07/25/2020 5:49:17 PM By: Yevonne PaxEpps, Carrie RN Entered By: Yevonne PaxEpps, Carrie on 07/24/2020 12:58:51

## 2020-07-31 ENCOUNTER — Other Ambulatory Visit: Payer: Self-pay

## 2020-07-31 ENCOUNTER — Encounter (HOSPITAL_BASED_OUTPATIENT_CLINIC_OR_DEPARTMENT_OTHER): Payer: Medicare Other | Attending: Internal Medicine | Admitting: Internal Medicine

## 2020-07-31 DIAGNOSIS — E079 Disorder of thyroid, unspecified: Secondary | ICD-10-CM | POA: Insufficient documentation

## 2020-07-31 DIAGNOSIS — M1990 Unspecified osteoarthritis, unspecified site: Secondary | ICD-10-CM | POA: Insufficient documentation

## 2020-07-31 DIAGNOSIS — L97222 Non-pressure chronic ulcer of left calf with fat layer exposed: Secondary | ICD-10-CM | POA: Diagnosis not present

## 2020-07-31 DIAGNOSIS — I110 Hypertensive heart disease with heart failure: Secondary | ICD-10-CM | POA: Insufficient documentation

## 2020-07-31 DIAGNOSIS — Z96651 Presence of right artificial knee joint: Secondary | ICD-10-CM | POA: Diagnosis not present

## 2020-07-31 DIAGNOSIS — I5020 Unspecified systolic (congestive) heart failure: Secondary | ICD-10-CM | POA: Diagnosis not present

## 2020-07-31 DIAGNOSIS — L97302 Non-pressure chronic ulcer of unspecified ankle with fat layer exposed: Secondary | ICD-10-CM | POA: Insufficient documentation

## 2020-07-31 DIAGNOSIS — I739 Peripheral vascular disease, unspecified: Secondary | ICD-10-CM | POA: Insufficient documentation

## 2020-07-31 DIAGNOSIS — I872 Venous insufficiency (chronic) (peripheral): Secondary | ICD-10-CM | POA: Diagnosis not present

## 2020-07-31 DIAGNOSIS — I890 Lymphedema, not elsewhere classified: Secondary | ICD-10-CM | POA: Diagnosis not present

## 2020-07-31 NOTE — Progress Notes (Signed)
Isabel Barnes, Isabel Barnes ([id number]) Visit Report for 07/31/2020 HPI Details Patient Name: Date of Service: PO [place], [name] 07/31/2020 12:45 PM Medical Record Number: [id number] Patient Account Number: [id number] Date of Birth/Sex: Treating RN: [birth year] (79 y.o. [name] Primary Care Provider: HEDGECO CK, SUZA NNE Other Clinician: Referring Provider: Treating Provider/Extender: Baltazar Najjar HEDGECO CK, SUZA NNE Weeks in Treatment: 3 History of Present Illness HPI Description: ADMISSION 04/12/2019 This is a pleasant 79 year old woman who comes in referred by her primary physician for review of a reopening of an original wound on the right lateral calf. The original wound was trauma against her dishwasher door on January 24. She was seen in an urgent care locally she was not sutured however she did get compression wraps and then this healed over. She states about a month ago there was increased swelling in the area reopened. She is here for our review of this. 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 Her past history is a bit complicated as well. She had a right total knee replacement 2 years ago and has had edema in the right greater than left leg since. She had a duplex ultrasound of the right leg 2 years ago that was negative for a DVT However she continues to have significant edema in the right greater than . left lower leg. She started to develop erythema in the right leg about a month ago although there is no pain unless she touches the area. She was put on clindamycin yesterday by her primary doctor. She also tells me that she has heart murmurs for many years and she has been referred by her primary doctor to cardiology but she does not yet have an appointment. She takes Lasix 20 mg a day. Past medical history includes hyperthyroidism, hypertension, widespread arthritis probably osteoarthritis,  right total knee replacement, neck surgery, low back surgery, chronic pain. ABI in our clinic was 1.17 on the right 5/21-Patient was initiated on treatment at the wound center last week for a nonhealing right calf wound. 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 She was placed in 3 layer compression with silver alginate dressings and ABD on the wounds last week.Ulcer looks smaller 5/28; 3 layer compression with silver alginate to continue. Duplex ultrasound study ordered last week were apparently negative according to the patient although she had this done at a [facility] imaging site and I do not see the results in care everywhere. In any case they apparently saw a an area of superficial phlebitis on the right thigh and she has been using topical NSAIDs [Voltaren] and she states that area feels a lot better. She is going for an echocardiogram on June 4 and sees the cardiologist the same day 6/4; using 3 layer compression with silver alginate. Much better edema control still a small open area. She brought in her ultrasound from last week which showed a age indeterminant occlusive superficial thrombophlebitis involving the greater saphenous vein at the level of the proximal thigh. The comment was made that there was no extension of this to the deep system. Presumably not felt to be at risk for deep vein migration. She also had her echocardiogram. I did not go over all of this in detail although she has a vigorous EF of 65%. Her right ventricular systolic function was normal. There was no increased right ventricular  wall thickness. Right ventricular systolic pressure was mildly elevated at 38. She did not have significant valve disease. She had moderate thickening of the mitral valve but without measurable stenosis It would appear that her edema is not well explained by the duplex ultrasounds or her echocardiogram. I would wonder about venous imaging in her pelvis.  Her edema was mostly pitting. 6/11; almost healed today. Surface eschar removed small open area remaining she should be healed by next week. I changed her to calcium alginate under 3 layer compression 6/18; the patient is healed today. Fully epithelialized. This was initially trauma in the setting of chronic venous insufficiency. At that time she had quite a bit of swelling in her leg pitting edema. Some of this was probably secondary to chronic venous insufficiency however was not sure we could blame all of this. She was put on some diuretics by enlarge the edema has resolved READMISSION 07/10/2020 -Patient presents with wound to the left lateral calf that started out when she tripped and fell with laceration wound that happened about 2 weeks ago, she was seen by primary who prescribed some dressings including Xeroform patient had been using silver gel more lately she also finished a course of doxycycline and was referred to the wound clinic for further care She also had a small water blister on the right lateral calf that burst and led to a small open area Patient is ABIs today in the clinic include 1.17 on the right and 0.96 on the left Patient denies any fevers chills, denies any excessive swelling in her legs other than baseline, she uses a lymphedema pumps regularly every day, she also has foot problems with diminished pads and heel pain she got injected in the right heel for plantar fasciitis, History significant for hypertension, lymphedema, recent onset of recurrent falls and episodes of syncope from sitting to standing position 07/17/2020; patient was readmitted to the clinic after a difficult fall resulting in wounds to her bilateral lower extremities. She states this occurred in the setting of a bathroom renovation she is doing in her house. She has a fairly significant deep punched out area on the left upper lateral lower leg a much smaller and benign looking area on the right lower leg.  We put her in compression and use silver alginate bilaterally 8/26; the wounds on her right leg are closed. She has her new Farrow wraps to put on today. On the left she has an open area on the left lateral upper calf this is come down in size and looks quite good. We have been using silver alginate under compression 9/2; her wound on the left leg laterally is closed. We have good edema control under our compression. She comes in today with no open wound on the right leg which we put a Farrow wrap on last week but her leg is swollen and erythematous anteriorly. She says the erythema is not unusual. She is not complaining of any pain but she is concerned about blistering. She has compression pumps but she does not pump over the wraps themselves she has some form of other stocking on. She also says she is missed some days with the stocking at all. Electronic Signature(s) Signed: 07/31/2020 5:13:24 PM By: Baltazar Najjarobson, Erving Sassano MD Entered By: Baltazar Najjarobson, Shahida Schnackenberg on 07/31/2020 13:54:00 -------------------------------------------------------------------------------- Physical Exam Details Patient Name: Date of Service: Berline LopesPO RTER, [name] 07/31/2020 12:45 PM Medical Record Number: [id number] Patient Account Number: [id number] Date of Birth/Sex: Treating RN: 09/27/1941 (79 y.o. F) Burnadette PeterLynch,  Shatara Primary Care Provider: HEDGECO CK, SUZA NNE Other Clinician: Referring Provider: Treating Provider/Extender: Baltazar Najjar HEDGECO CK, SUZA NNE Weeks in Treatment: 3 Cardiovascular Pedal pulses are palpable. Integumentary (Hair, Skin) Marked erythema on the right anterior leg however this is nontender. In the edema in the leg is nonpitting. Notes Wound exam; the area on the right leg is closed over but the leg is swollen erythematous anteriorly. Nonpitting edema. There is no open wound Nothing is open on the left leg which we wrapped last week. Electronic Signature(s) Signed: 07/31/2020 5:13:24 PM By: Baltazar Najjar MD Entered By: Baltazar Najjar on 07/31/2020 13:55:01 -------------------------------------------------------------------------------- Physician Orders Details Patient Name: Date of Service: [name], [name] 07/31/2020 12:45 PM Medical Record Number: [id number] Patient Account Number: [id number] Date of Birth/Sex: Treating RN: 05-Dec-1940 (79 y.o. [name] Primary Care Provider: HEDGECO CK, SUZA NNE Other Clinician: Referring Provider: Treating Provider/Extender: Baltazar Najjar HEDGECO CK, SUZA NNE Weeks in Treatment: 3 Verbal / Phone Orders: No Diagnosis Coding ICD-10 Coding Code Description L97.222 Non-pressure chronic ulcer of left calf with fat layer exposed L97.302 Non-pressure chronic ulcer of unspecified ankle with fat layer exposed I89.0 Lymphedema, not elsewhere classified I50.20 Unspecified systolic (congestive) heart failure Follow-up Appointments Return Appointment in 2 weeks. Skin Barriers/Peri-Wound Care Moisturizing lotion - both legs daily Edema Control Avoid standing for long periods of time Elevate legs to the level of the heart or above for 30 minutes daily and/or when sitting, a frequency of: - throughout the day Exercise regularly Support Garment 20-30 mm/Hg pressure to: - Farrow wraps to both legs daily, apply first thing in the morning, remove at night before bed Segmental Compressive Device. - lymphedema pumps twice a day for 1 hour each time, may wear farrow wraps while pumping Electronic Signature(s) Signed: 07/31/2020 5:08:15 PM By: Zandra Abts RN, BSN Signed: 07/31/2020 5:13:24 PM By: Baltazar Najjar MD Entered By: Zandra Abts on 07/31/2020 13:42:10 -------------------------------------------------------------------------------- Problem List Details Patient Name: Date of Service: [name], [name] 07/31/2020 12:45 PM Medical Record Number: [id number] Patient Account Number: [id number] Date of Birth/Sex: Treating  RN: [birth year] (79 y.o. [name] Primary Care Provider: HEDGECO CK, SUZA NNE Other Clinician: Referring Provider: Treating Provider/Extender: Baltazar Najjar HEDGECO CK, SUZA NNE Weeks in Treatment: 3 Active Problems ICD-10 Encounter Code Description Active Date MDM Diagnosis L97.222 Non-pressure chronic ulcer of left calf with fat layer exposed 07/17/2020 No Yes L97.302 Non-pressure chronic ulcer of unspecified ankle with fat layer exposed 07/10/2020 No Yes I89.0 Lymphedema, not elsewhere classified 07/10/2020 No Yes I50.20 Unspecified systolic (congestive) heart failure 07/10/2020 No Yes Inactive Problems Resolved Problems Electronic Signature(s) Signed: 07/31/2020 5:13:24 PM By: Baltazar Najjar MD Entered By: Baltazar Najjar on 07/31/2020 13:52:44 -------------------------------------------------------------------------------- Progress Note Details Patient Name: Date of Service: [name], [name] 07/31/2020 12:45 PM Medical Record Number: [id number] Patient Account Number: [id number] Date of Birth/Sex: Treating RN: 22-May-1941 (79 y.o. [name] Primary Care Provider: HEDGECO CK, SUZA NNE Other Clinician: Referring Provider: Treating Provider/Extender: Baltazar Najjar HEDGECO CK, SUZA NNE Weeks in Treatment: 3 Subjective History of Present Illness (HPI) ADMISSION 04/12/2019 This is a pleasant 79 year old woman who comes in referred by her primary physician for review of a reopening of an original wound on the right lateral calf. The original wound was trauma against her dishwasher door on January 24. She was seen in an urgent care locally she was not sutured however she did get compression wraps and then this healed over. She states about  a month ago there was increased swelling in the area reopened. She is here for our review of this. 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 Her  past history is a bit complicated as well. She had a right total knee replacement 2 years ago and has had edema in the right greater than left leg since. She had a duplex ultrasound of the right leg 2 years ago that was negative for a DVT However she continues to have significant edema in the right greater than . left lower leg. She started to develop erythema in the right leg about a month ago although there is no pain unless she touches the area. She was put on clindamycin yesterday by her primary doctor. She also tells me that she has heart murmurs for many years and she has been referred by her primary doctor to cardiology but she does not yet have an appointment. She takes Lasix 20 mg a day. Past medical history includes hyperthyroidism, hypertension, widespread arthritis probably osteoarthritis, right total knee replacement, neck surgery, low back surgery, chronic pain. ABI in our clinic was 1.17 on the right 5/21-Patient was initiated on treatment at the wound center last week for a nonhealing right calf wound. 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 She was placed in 3 layer compression with silver alginate dressings and ABD on the wounds last week.Ulcer looks smaller 5/28; 3 layer compression with silver alginate to continue. Duplex ultrasound study ordered last week were apparently negative according to the patient although she had this done at a [facility] imaging site and I do not see the results in care everywhere. In any case they apparently saw a an area of superficial phlebitis on the right thigh and she has been using topical NSAIDs [Voltaren] and she states that area feels a lot better. She is going for an echocardiogram on June 4 and sees the cardiologist the same day 6/4; using 3 layer compression with silver alginate. Much better edema control still a small open area. She brought in her ultrasound from last week which showed a age  indeterminant occlusive superficial thrombophlebitis involving the greater saphenous vein at the level of the proximal thigh. The comment was made that there was no extension of this to the deep system. Presumably not felt to be at risk for deep vein migration. She also had her echocardiogram. I did not go over all of this in detail although she has a vigorous EF of 65%. Her right ventricular systolic function was normal. There was no increased right ventricular wall thickness. Right ventricular systolic pressure was mildly elevated at 38. She did not have significant valve disease. She had moderate thickening of the mitral valve but without measurable stenosis It would appear that her edema is not well explained by the duplex ultrasounds or her echocardiogram. I would wonder about venous imaging in her pelvis. Her edema was mostly pitting. 6/11; almost healed today. Surface eschar removed small open area remaining she should be healed by next week. I changed her to calcium alginate under 3 layer compression 6/18; the patient is healed today. Fully epithelialized. This was initially trauma in the setting of chronic venous insufficiency. At that time she had quite a bit of swelling in her leg pitting edema. Some of this was probably secondary to chronic venous insufficiency however was not sure we  could blame all of this. She was put on some diuretics by enlarge the edema has resolved READMISSION 07/10/2020 -Patient presents with wound to the left lateral calf that started out when she tripped and fell with laceration wound that happened about 2 weeks ago, she was seen by primary who prescribed some dressings including Xeroform patient had been using silver gel more lately she also finished a course of doxycycline and was referred to the wound clinic for further care She also had a small water blister on the right lateral calf that burst and led to a small open area Patient is ABIs today in the clinic  include 1.17 on the right and 0.96 on the left Patient denies any fevers chills, denies any excessive swelling in her legs other than baseline, she uses a lymphedema pumps regularly every day, she also has foot problems with diminished pads and heel pain she got injected in the right heel for plantar fasciitis, History significant for hypertension, lymphedema, recent onset of recurrent falls and episodes of syncope from sitting to standing position 07/17/2020; patient was readmitted to the clinic after a difficult fall resulting in wounds to her bilateral lower extremities. She states this occurred in the setting of a bathroom renovation she is doing in her house. She has a fairly significant deep punched out area on the left upper lateral lower leg a much smaller and benign looking area on the right lower leg. We put her in compression and use silver alginate bilaterally 8/26; the wounds on her right leg are closed. She has her new Farrow wraps to put on today. On the left she has an open area on the left lateral upper calf this is come down in size and looks quite good. We have been using silver alginate under compression 9/2; her wound on the left leg laterally is closed. We have good edema control under our compression. She comes in today with no open wound on the right leg which we put a Farrow wrap on last week but her leg is swollen and erythematous anteriorly. She says the erythema is not unusual. She is not complaining of any pain but she is concerned about blistering. She has compression pumps but she does not pump over the wraps themselves she has some form of other stocking on. She also says she is missed some days with the stocking at all. Objective Constitutional Vitals Time Taken: 1:10 PM, Height: 62 in, Weight: 135 lbs, BMI: 24.7, Temperature: 98.3 F, Pulse: 103 bpm, Respiratory Rate: 18 breaths/min, Blood Pressure: 158/70 mmHg. Cardiovascular Pedal pulses are palpable. General  Notes: Wound exam; the area on the right leg is closed over but the leg is swollen erythematous anteriorly. Nonpitting edema. There is no open wound ooNothing is open on the left leg which we wrapped last week. Integumentary (Hair, Skin) Marked erythema on the right anterior leg however this is nontender. In the edema in the leg is nonpitting. Wound #2 status is Healed - Epithelialized. Original cause of wound was Trauma. The wound is located on the Left,Lateral Lower Leg. The wound measures 0cm length x 0cm width x 0cm depth; 0cm^2 area and 0cm^3 volume. There is no tunneling or undermining noted. There is a none present amount of drainage noted. The wound margin is distinct with the outline attached to the wound base. There is no granulation within the wound bed. There is no necrotic tissue within the wound bed. Assessment Active Problems ICD-10 Non-pressure chronic ulcer of left calf with  fat layer exposed Non-pressure chronic ulcer of unspecified ankle with fat layer exposed Lymphedema, not elsewhere classified Unspecified systolic (congestive) heart failure Plan Follow-up Appointments: Return Appointment in 2 weeks. Skin Barriers/Peri-Wound Care: Moisturizing lotion - both legs daily Edema Control: Avoid standing for long periods of time Elevate legs to the level of the heart or above for 30 minutes daily and/or when sitting, a frequency of: - throughout the day Exercise regularly Support Garment 20-30 mm/Hg pressure to: - Farrow wraps to both legs daily, apply first thing in the morning, remove at night before bed Segmental Compressive Device. - lymphedema pumps twice a day for 1 hour each time, may wear farrow wraps while pumping 1. I think this patient has severe stasis dermatitis in the right leg. I do not believe this is cellulitis 2. I have asked her to use her compression pumps over her Farrow wraps. She is doing this for 45 minutes twice a day. 3. I am concerned about the  amount of swelling in the right leg vis--vis further skin breakdown. We will check her again in 2 weeks although truthfully if she is compliant, I do not know what else we can do here Electronic Signature(s) Signed: 07/31/2020 5:13:24 PM By: Baltazar Najjar MD Entered By: Baltazar Najjar on 07/31/2020 13:56:05 -------------------------------------------------------------------------------- SuperBill Details Patient Name: Date of Service: [name], [name] 07/31/2020 Medical Record Number: [id number] Patient Account Number: [id number] Date of Birth/Sex: Treating RN: 1941/06/06 (79 y.o. [name] Primary Care Provider: HEDGECO CK, SUZA NNE Other Clinician: Referring Provider: Treating Provider/Extender: Baltazar Najjar HEDGECO CK, SUZA NNE Weeks in Treatment: 3 Diagnosis Coding ICD-10 Codes Code Description [phone] Non-pressure chronic ulcer of left calf with fat layer exposed L97.302 Non-pressure chronic ulcer of unspecified ankle with fat layer exposed I89.0 Lymphedema, not elsewhere classified I50.20 Unspecified systolic (congestive) heart failure Facility Procedures CPT4 Code: [id number] Description: 99213 - WOUND CARE VISIT-LEV 3 EST PT Modifier: Quantity: 1 Physician Procedures : CPT4 Code Description Modifier [id number] - WC PHYS LEVEL 3 - EST PT ICD-10 Diagnosis Description I89.0 Lymphedema, not elsewhere classified L97.222 Non-pressure chronic ulcer of left calf with fat layer exposed L97.302 Non-pressure chronic ulcer of  unspecified ankle with fat layer exposed Quantity: 1 Electronic Signature(s) Signed: 07/31/2020 5:08:15 PM By: Zandra Abts RN, BSN Signed: 07/31/2020 5:13:24 PM By: Baltazar Najjar MD Entered By: Zandra Abts on 07/31/2020 15:42:48

## 2020-08-01 NOTE — Progress Notes (Signed)
Isabel Barnes, Isabel Barnes ([id number]) Visit Report for 07/31/2020 Arrival Information Details Patient Name: Date of Service: [place], [name] 07/31/2020 12:45 PM Medical Record Number: [id number] Patient Account Number: [id number] Date of Birth/Sex: Treating RN: [birth year] (79 y.o. [name] Primary Care Saphia Vanderford: HEDGECO CK, SUZA NNE Other Clinician: Referring Mi Balla: Treating Kainon Varady/Extender: Baltazar Najjar HEDGECO CK, SUZA NNE Weeks in Treatment: 3 Visit Information History Since Last Visit Added or deleted any medications: No Patient Arrived: [place] Mor Any new allergies or adverse reactions: No Arrival Time: 13:10 Had a fall or experienced change in No Accompanied By: self activities of daily living that may affect Transfer Assistance: None risk of falls: Patient Identification Verified: Yes Signs or symptoms of abuse/neglect since last visito No Secondary Verification Process Completed: Yes Hospitalized since last visit: No Patient Has Alerts: Yes Implantable device outside of the clinic excluding No Patient Alerts: Patient on Blood Thinner cellular tissue based products placed in the center Left ABI: 0.96 since last visit: Right ABI: 1.17 Has Dressing in Place as Prescribed: Yes Has Compression in Place as Prescribed: Yes Pain Present Now: No Electronic Signature(s) Signed: 07/31/2020 4:56:16 PM By: Cherylin Mylar Entered By: Cherylin Mylar on 07/31/2020 13:10:41 -------------------------------------------------------------------------------- Clinic Level of Care Assessment Details Patient Name: Date of Service: [place], [name] 07/31/2020 12:45 PM Medical Record Number: [id number] Patient Account Number: [id number] Date of Birth/Sex: Treating RN: February 15, 1941 (79 y.o. [name] Primary Care Shanette Tamargo: HEDGECO CK, SUZA NNE Other Clinician: Referring [employer]: Treating Quanasia Defino/Extender: Baltazar Najjar HEDGECO CK, SUZA NNE Weeks in Treatment:  3 Clinic Level of Care Assessment Items TOOL 4 Quantity Score X- 1 0 Use when only an EandM is performed on FOLLOW-UP visit ASSESSMENTS - Nursing Assessment / Reassessment X- 1 10 Reassessment of Co-morbidities (includes updates in patient status) X- 1 5 Reassessment of Adherence to Treatment Plan ASSESSMENTS - Wound and Skin A ssessment / Reassessment X - Simple Wound Assessment / Reassessment - one wound 1 5 []  - 0 Complex Wound Assessment / Reassessment - multiple wounds []  - 0 Dermatologic / Skin Assessment (not related to wound area) ASSESSMENTS - Focused Assessment []  - 0 Circumferential Edema Measurements - multi extremities []  - 0 Nutritional Assessment / Counseling / Intervention X- 1 5 Lower Extremity Assessment (monofilament, tuning fork, pulses) []  - 0 Peripheral Arterial Disease Assessment (using hand held doppler) ASSESSMENTS - Ostomy and/or Continence Assessment and Care []  - 0 Incontinence Assessment and Management []  - 0 Ostomy Care Assessment and Management (repouching, etc.) PROCESS - Coordination of Care X - Simple Patient / Family Education for ongoing care 1 15 []  - 0 Complex (extensive) Patient / Family Education for ongoing care X- 1 10 Staff obtains , Records, T Results / Process Orders est []  - 0 Staff telephones HHA, Nursing Homes / Clarify orders / etc []  - 0 Routine Transfer to another Facility (non-emergent condition) []  - 0 Routine Hospital Admission (non-emergent condition) []  - 0 New Admissions / / Ordering NPWT Apligraf, etc. , []  - 0 Emergency Hospital Admission (emergent condition) X- 1 10 Simple Discharge Coordination []  - 0 Complex (extensive) Discharge Coordination PROCESS - Special Needs []  - 0 Pediatric / Minor Patient Management []  - 0 Isolation Patient Management []  - 0 Hearing / Language / Visual special needs []  - 0 Assessment of Community assistance (transportation, D/C planning,  etc.) []  - 0 Additional assistance / Altered mentation []  - 0 Support Surface(s) Assessment (bed, cushion, seat, etc.) INTERVENTIONS - Wound  Cleansing / Measurement X - Simple Wound Cleansing - one wound 1 5 []  - 0 Complex Wound Cleansing - multiple wounds X- 1 5 Wound Imaging (photographs - any number of wounds) []  - 0 Wound Tracing (instead of photographs) X- 1 5 Simple Wound Measurement - one wound []  - 0 Complex Wound Measurement - multiple wounds INTERVENTIONS - Wound Dressings []  - 0 Small Wound Dressing one or multiple wounds []  - 0 Medium Wound Dressing one or multiple wounds []  - 0 Large Wound Dressing one or multiple wounds []  - 0 Application of Medications - topical []  - 0 Application of Medications - injection INTERVENTIONS - Miscellaneous []  - 0 External ear exam []  - 0 Specimen Collection (cultures, biopsies, blood, body fluids, etc.) []  - 0 Specimen(s) / Culture(s) sent or taken to Lab for analysis []  - 0 Patient Transfer (multiple staff / Nurse, adultHoyer Lift / Similar devices) []  - 0 Simple Staple / Suture removal (25 or less) []  - 0 Complex Staple / Suture removal (26 or more) []  - 0 Hypo / Hyperglycemic Management (close monitor of Blood Glucose) []  - 0 Ankle / Brachial Index (ABI) - do not check if billed separately X- 1 5 Vital Signs Has the patient been seen at the hospital within the last three years: Yes Total Score: 80 Level Of Care: New/Established - Level 3 Electronic Signature(s) Signed: 07/31/2020 5:08:15 PM By: Zandra AbtsLynch, Shatara RN, BSN Entered By: Zandra AbtsLynch, Shatara on 07/31/2020 15:42:38 -------------------------------------------------------------------------------- Encounter Discharge Information Details Patient Name: Date of Service: Isabel LopesPO RTER, [name] 07/31/2020 12:45 PM Medical Record Number: [id number] Patient Account Number: [id number] Date of Birth/Sex: Treating RN: 04/03/1941 (79 y.o. Tommye StandardF) Boehlein, Linda Primary Care Brittan Mapel: HEDGECO CK,  SUZA NNE Other Clinician: Referring Arayah Krouse: Treating Ashleyann Shoun/Extender: Baltazar Najjarobson, Michael HEDGECO CK, SUZA NNE Weeks in Treatment: 3 Encounter Discharge Information Items Discharge Condition: Stable Ambulatory Status: Cane Discharge Destination: Home Transportation: Private Auto Accompanied By: self Schedule Follow-up Appointment: Yes Clinical Summary of Care: Patient Declined Electronic Signature(s) Signed: 08/01/2020 2:45:56 PM By: Zenaida DeedBoehlein, Linda RN, BSN Entered By: Zenaida DeedBoehlein, Linda on 07/31/2020 14:31:02 -------------------------------------------------------------------------------- Lower Extremity Assessment Details Patient Name: Date of Service: Isabel LopesPO [place]TER[name] 07/31/2020 12:45 PM Medical Record Number: [id number] Patient Account Number: [id number] Date of Birth/Sex: Treating RN: 03/26/1941 (79 y.o. Harvest DarkF) Dwiggins, Shannon Primary Care Emauri Krygier: HEDGECO CK, SUZA NNE Other Clinician: Referring Zohal Reny: Treating Jayston Trevino/Extender: Baltazar Najjarobson, Michael HEDGECO CK, SUZA NNE Weeks in Treatment: 3 Edema Assessment Assessed: [Left: No] [Right: No] [Left: Edema] [Right: :] Calf Left: Right: Point of Measurement: 32 cm From Medial Instep 31 cm cm Ankle Left: Right: Point of Measurement: 13 cm From Medial Instep 18 cm cm Vascular Assessment Pulses: Dorsalis Pedis Palpable: [Left:Yes] Electronic Signature(s) Signed: 07/31/2020 4:56:16 PM By: Cherylin Mylarwiggins, Shannon Entered By: Cherylin Mylarwiggins, Shannon on 07/31/2020 13:17:06 -------------------------------------------------------------------------------- Multi Wound Chart Details Patient Name: Date of Service: Isabel LopesPO RTER, [name] 07/31/2020 12:45 PM Medical Record Number: [id number] Patient Account Number: [id number] Date of Birth/Sex: Treating RN: 04/21/1941 (79 y.o. Wynelle LinkF) Lynch, Shatara Primary Care Sun Kihn: HEDGECO CK, SUZA NNE Other Clinician: Referring Ryman Rathgeber: Treating Tamiah Dysart/Extender: Baltazar Najjarobson, Michael HEDGECO CK, SUZA NNE Weeks in  Treatment: 3 Vital Signs Height(in): 62 Pulse(bpm): 103 Weight(lbs): 135 Blood Pressure(mmHg): 158/70 Body Mass Index(BMI): 25 Temperature(F): 98.3 Respiratory Rate(breaths/min): 18 Photos: [2:No Photos Left, Lateral Lower Leg] [N/A:N/A N/A] Wound Location: [2:Trauma] [N/A:N/A] Wounding Event: [2:Venous Leg Ulcer] [N/A:N/A] Primary Etiology: [2:Lymphedema] [N/A:N/A] Secondary Etiology: [2:Cataracts, Chronic sinus] [N/A:N/A] Comorbid History: [2:problems/congestion, Anemia, Hypertension, Peripheral Venous Disease, Osteoarthritis,  Confinement Anxiety 06/10/2020] [N/A:N/A] Date Acquired: [2:3] [N/A:N/A] Weeks of Treatment: [2:Healed - Epithelialized] [N/A:N/A] Wound Status: [2:0x0x0] [N/A:N/A] Measurements L x W x D (cm) [2:0] [N/A:N/A] A (cm) : rea [2:0] [N/A:N/A] Volume (cm) : [2:100.00%] [N/A:N/A] % Reduction in Area: [2:100.00%] [N/A:N/A] % Reduction in Volume: [2:Full Thickness Without Exposed] [N/A:N/A] Classification: [2:Support Structures None Present] [N/A:N/A] Exudate Amount: [2:Distinct, outline attached] [N/A:N/A] Wound Margin: [2:None Present (0%)] [N/A:N/A] Granulation Amount: [2:None Present (0%)] [N/A:N/A] Necrotic Amount: [2:Fascia: No] [N/A:N/A] Exposed Structures: [2:Fat Layer (Subcutaneous Tissue): No Tendon: No Muscle: No Joint: No Bone: No Large (67-100%)] [N/A:N/A] Treatment Notes Electronic Signature(s) Signed: 07/31/2020 5:08:15 PM By: Zandra Abts RN, BSN Signed: 07/31/2020 5:13:24 PM By: Baltazar Najjar MD Entered By: Baltazar Najjar on 07/31/2020 13:52:50 -------------------------------------------------------------------------------- Multi-Disciplinary Care Plan Details Patient Name: Date of Service: Isabel Barnes, [place] [name] 07/31/2020 12:45 PM Medical Record Number: [id number] Patient Account Number: [id number] Date of Birth/Sex: Treating RN: [birth year] (79 y.o. 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 Compression as ordered Provide education on venous insufficiency Notes: Wound/Skin Impairment Nursing Diagnoses: Impaired tissue integrity Knowledge deficit related to ulceration/compromised skin integrity Goals: Patient/caregiver will verbalize understanding of skin care regimen Date Initiated: 07/10/2020 Target Resolution Date: 08/08/2020 Goal Status: Active Interventions: Assess patient/caregiver ability to obtain necessary supplies Assess patient/caregiver ability to perform ulcer/skin care regimen upon admission and as needed Assess ulceration(s) every visit Provide education on ulcer and skin care Notes: Electronic Signature(s) Signed: 07/31/2020 5:08:15 PM By: Zandra Abts RN, BSN Entered By: Zandra Abts on 07/31/2020 15:42:16 -------------------------------------------------------------------------------- Pain Assessment Details Patient Name: Date of Service: Isabel Lopes, [name] 07/31/2020 12:45 PM Medical Record Number: [id number] Patient Account Number: [id number] Date of Birth/Sex: Treating RN: 1941/10/22 (79 y.o. [name] Primary Care Lariah Fleer: HEDGECO CK, SUZA NNE Other Clinician: Referring Andrez Lieurance: Treating Bernardino Dowell/Extender: Baltazar Najjar HEDGECO CK, SUZA NNE Weeks in Treatment: 3 Active Problems Location of Pain Severity and Description of Pain Patient Has Paino No Site Locations Pain Management and Medication Current Pain Management: Electronic Signature(s) Signed: 07/31/2020  4:56:16 PM By: Cherylin Mylar Entered By: Cherylin Mylar on 07/31/2020 13:16:51 -------------------------------------------------------------------------------- Patient/Caregiver Education Details Patient Name: Date of Service: Isabel Barnes, [place] 9/2/2021andnbsp12:45 PM Medical Record Number: [id number] Patient Account Number: [id number] Date of Birth/Gender: Treating RN: 30-Sep-1941 (79 y.o. [name] Primary Care Physician: HEDGECO CK, SUZA NNE Other Clinician: Referring Physician: Treating Physician/Extender: Baltazar Najjar HEDGECO CK, SUZA NNE Weeks in Treatment: 3 Education Assessment Education Provided To: Patient Education Topics Provided Venous: Methods: Explain/Verbal Responses: State content correctly Wound/Skin Impairment: Methods: Explain/Verbal Responses: State content correctly Electronic Signature(s) Signed: 07/31/2020 5:08:15 PM By: Zandra Abts RN, BSN Entered By: Zandra Abts on 07/31/2020 13:40:10 -------------------------------------------------------------------------------- Wound Assessment Details Patient Name: Date of Service: Isabel Lopes, [name] 07/31/2020 12:45 PM Medical Record Number: [id number] Patient Account Number: [id number] Date of Birth/Sex: Treating RN: 1940/12/13 (79 y.o. [name] Primary Care Alexsis Kathman: HEDGECO CK, SUZA NNE Other Clinician: Referring Akeiba Axelson: Treating Ples Trudel/Extender: Baltazar Najjar HEDGECO CK, SUZA NNE Weeks in Treatment: 3 Wound Status Wound Number: 2 Primary Venous Leg Ulcer Etiology: Wound Location:  Left, Lateral Lower Leg Secondary Lymphedema Wounding Event: Trauma Etiology: Date Acquired: 06/10/2020 Wound Healed - Epithelialized Weeks Of Treatment: 3 Status: Clustered Wound: No Comorbid Cataracts, Chronic sinus problems/congestion, Anemia, History: Hypertension, Peripheral Venous Disease, Osteoarthritis, Confinement Anxiety Wound Measurements Length: (cm) Width:  (cm) Depth: (cm) Area: (cm) Volume: (cm) 0 % Reduction in Area: 100% 0 % Reduction in Volume: 100% 0 Epithelialization: Large (67-100%) 0 Tunneling: No 0 Undermining: No Wound Description Classification: Full Thickness Without Exposed Support Structures Wound Margin: Distinct, outline attached Exudate Amount: None Present Foul Odor After Cleansing: No Slough/Fibrino No Wound Bed Granulation Amount: None Present (0%) Exposed Structure Necrotic Amount: None Present (0%) Fascia Exposed: No Fat Layer (Subcutaneous Tissue) Exposed: No Tendon Exposed: No Muscle Exposed: No Joint Exposed: No Bone Exposed: No Electronic Signature(s) Signed: 07/31/2020 4:56:16 PM By: Cherylin Mylar Entered By: Cherylin Mylar on 07/31/2020 13:22:50 -------------------------------------------------------------------------------- Vitals Details Patient Name: Date of Service: Isabel Lopes, [name] 07/31/2020 12:45 PM Medical Record Number: [id number] Patient Account Number: [id number] Date of Birth/Sex: Treating RN: 10-25-1941 (79 y.o. [name] Primary Care Akshay Spang: HEDGECO CK, SUZA NNE Other Clinician: Referring Blessings Inglett: Treating Nyzir Dubois/Extender: Baltazar Najjar HEDGECO CK, SUZA NNE Weeks in Treatment: 3 Vital Signs Time Taken: 13:10 Temperature (F): 98.3 Height (in): 62 Pulse (bpm): 103 Weight (lbs): 135 Respiratory Rate (breaths/min): 18 Body Mass Index (BMI): 24.7 Blood Pressure (mmHg): 158/70 Reference Range: 80 - 120 mg / dl Electronic Signature(s) Signed: 07/31/2020 4:56:16 PM By: Cherylin Mylar Entered By: Cherylin Mylar on 07/31/2020 13:13:24

## 2020-08-14 ENCOUNTER — Encounter (HOSPITAL_BASED_OUTPATIENT_CLINIC_OR_DEPARTMENT_OTHER): Payer: Medicare Other | Admitting: Internal Medicine

## 2020-08-14 DIAGNOSIS — L97222 Non-pressure chronic ulcer of left calf with fat layer exposed: Secondary | ICD-10-CM | POA: Diagnosis not present

## 2020-08-14 NOTE — Progress Notes (Signed)
Isabel, Barnes ([id number]) Visit Report for 08/14/2020 Arrival Information Details Patient Name: Date of Service: [place], [name] 08/14/2020 1:00 PM Medical Record Number: [id number] Patient Account Number: [id number] Date of Birth/Sex: Treating RN: 05-05-1941 (79 y.o. Isabel Barnes Primary Care Doha Boling: HEDGECO CK, SUZA NNE Other Clinician: Referring Charleton Deyoung: Treating Zander Ingham/Extender: Baltazar Najjar HEDGECO CK, SUZA NNE Weeks in Treatment: 5 Visit Information History Since Last Visit Added or deleted any medications: No Patient Arrived: Isabel Barnes Any new allergies or adverse reactions: No Arrival Time: 13:13 Had a fall or experienced change in No Accompanied By: self activities of daily living that may affect Transfer Assistance: None risk of falls: Patient Identification Verified: Yes Signs or symptoms of abuse/neglect since last visito No Secondary Verification Process Completed: Yes Hospitalized since last visit: No Patient Has Alerts: Yes Implantable device outside of the clinic excluding No Patient Alerts: Patient on Blood Thinner cellular tissue based products placed in the center Left ABI: 0.96 since last visit: Right ABI: 1.17 Has Dressing in Place as Prescribed: Yes Has Compression in Place as Prescribed: Yes Pain Present Now: No Electronic Signature(s) Signed: 08/14/2020 4:34:39 PM By: Cherylin Mylar Entered By: Cherylin Mylar on 08/14/2020 13:14:38 -------------------------------------------------------------------------------- Clinic Level of Care Assessment Details Patient Name: Date of Service: PO [place], [name] 08/14/2020 1:00 PM Medical Record Number: [id number] Patient Account Number: [id number] Date of Birth/Sex: Treating RN: December 12, 1940 (79 y.o. Isabel Barnes Primary Care Ewart Carrera: HEDGECO CK, SUZA NNE Other Clinician: Referring Nikola Blackston: Treating Socorro Kanitz/Extender: Baltazar Najjar HEDGECO CK, SUZA NNE Weeks in Treatment:  5 Clinic Level of Care Assessment Items TOOL 4 Quantity Score X- 1 0 Use when only an EandM is performed on FOLLOW-UP visit ASSESSMENTS - Nursing Assessment / Reassessment X- 1 10 Reassessment of Co-morbidities (includes updates in patient status) X- 1 5 Reassessment of Adherence to Treatment Plan ASSESSMENTS - Wound and Skin A ssessment / Reassessment []  - 0 Simple Wound Assessment / Reassessment - one wound []  - 0 Complex Wound Assessment / Reassessment - multiple wounds X- 1 10 Dermatologic / Skin Assessment (not related to wound area) ASSESSMENTS - Focused Assessment []  - 0 Circumferential Edema Measurements - multi extremities X- 1 10 Nutritional Assessment / Counseling / Intervention []  - 0 Lower Extremity Assessment (monofilament, tuning fork, pulses) []  - 0 Peripheral Arterial Disease Assessment (using hand held doppler) ASSESSMENTS - Ostomy and/or Continence Assessment and Care []  - 0 Incontinence Assessment and Management []  - 0 Ostomy Care Assessment and Management (repouching, etc.) PROCESS - Coordination of Care X - Simple Patient / Family Education for ongoing care 1 15 []  - 0 Complex (extensive) Patient / Family Education for ongoing care X- 1 10 Staff obtains , Records, T Results / Process Orders est []  - 0 Staff telephones HHA, Nursing Homes / Clarify orders / etc []  - 0 Routine Transfer to another Facility (non-emergent condition) []  - 0 Routine Hospital Admission (non-emergent condition) []  - 0 New Admissions / / Ordering NPWT Apligraf, etc. , []  - 0 Emergency Hospital Admission (emergent condition) X- 1 10 Simple Discharge Coordination []  - 0 Complex (extensive) Discharge Coordination PROCESS - Special Needs []  - 0 Pediatric / Minor Patient Management []  - 0 Isolation Patient Management []  - 0 Hearing / Language / Visual special needs []  - 0 Assessment of Community assistance (transportation, D/C  planning, etc.) []  - 0 Additional assistance / Altered mentation []  - 0 Support Surface(s) Assessment (bed, cushion, seat, etc.) INTERVENTIONS - Wound Cleansing /  Measurement []  - 0 Simple Wound Cleansing - one wound []  - 0 Complex Wound Cleansing - multiple wounds []  - 0 Wound Imaging (photographs - any number of wounds) []  - 0 Wound Tracing (instead of photographs) []  - 0 Simple Wound Measurement - one wound []  - 0 Complex Wound Measurement - multiple wounds INTERVENTIONS - Wound Dressings []  - 0 Small Wound Dressing one or multiple wounds []  - 0 Medium Wound Dressing one or multiple wounds []  - 0 Large Wound Dressing one or multiple wounds []  - 0 Application of Medications - topical []  - 0 Application of Medications - injection INTERVENTIONS - Miscellaneous []  - 0 External ear exam []  - 0 Specimen Collection (cultures, biopsies, blood, body fluids, etc.) []  - 0 Specimen(s) / Culture(s) sent or taken to Lab for analysis []  - 0 Patient Transfer (multiple staff / / Similar devices) []  - 0 Simple Staple / Suture removal (25 or less) []  - 0 Complex Staple / Suture removal (26 or more) []  - 0 Hypo / Hyperglycemic Management (close monitor of Blood Glucose) []  - 0 Ankle / Brachial Index (ABI) - do not check if billed separately X- 1 5 Vital Signs Has the patient been seen at the hospital within the last three years: Yes Total Score: 75 Level Of Care: New/Established - Level 2 Electronic Signature(s) Signed: 08/14/2020 4:55:33 PM By: Entered By: on 08/14/2020 13:24:26 -------------------------------------------------------------------------------- Lower Extremity Assessment Details Patient Name: Date of Service: [name], . 08/14/2020 1:00 PM Medical Record Number: Patient Account Number: Date of Birth/Sex: Treating RN: 11-Feb-1941 (79 y.o. Primary Care Keoni Havey: HEDGECO CK,  SUZA NNE Other Clinician: Referring Cassius Cullinane: Treating Cristella Stiver/Extender: HEDGECO CK, SUZA NNE Weeks in Treatment: 5 Electronic Signature(s) Signed: 08/14/2020 4:34:39 PM By: Entered By: Nurse, adult on 08/14/2020 13:16:15 -------------------------------------------------------------------------------- Multi-Disciplinary Care Plan Details Patient Name: Date of Service: , [name] 08/14/2020 1:00 PM Medical Record Number: Patient Account Number: 08/16/2020 Date of Birth/Sex: Treating RN: 02/11/1941 (79 y.o. 08/16/2020 Primary Care Mattis Featherly: HEDGECO CK, SUZA NNE Other Clinician: Referring Cayne Yom: Treating Jordell Outten/Extender: [name] HEDGECO CK, SUZA NNE Weeks in Treatment: 5 Active Inactive Electronic Signature(s) Signed: 08/14/2020 4:55:33 PM By: [id number] Entered By: [id number] on 08/14/2020 13:09:21 -------------------------------------------------------------------------------- Pain Assessment Details Patient Name: Date of Service: 89, Isabel Barnes. 08/14/2020 1:00 PM Medical Record Number: 08/16/2020 Patient Account Number: Cherylin Mylar Date of Birth/Sex: Treating RN: 13-Nov-1941 (79 y.o. Isabel Barnes Primary Care Carlton Sweaney: HEDGECO CK, SUZA NNE Other Clinician: Referring Marda Breidenbach: Treating Armend Hochstatter/Extender: 08/16/2020 HEDGECO CK, SUZA NNE Weeks in Treatment: 5 Active Problems Location of Pain Severity and Description of Pain Patient Has Paino No Site Locations Pain Management and Medication Current Pain Management: Electronic Signature(s) Signed: 08/14/2020 4:34:39 PM By: [id number] Entered By: 10/06/1941 on 08/14/2020 13:16:06 -------------------------------------------------------------------------------- Patient/Caregiver Education Details Patient Name: Date of Service: [address], [name] 9/16/2021andnbsp1:00 PM Medical Record Number: Shawn Stall Patient  Account Number: Shawn Stall Date of Birth/Gender: Treating RN: [birth year] (79 y.o. [name] Primary Care Physician: HEDGECO CK, SUZA NNE Other Clinician: Referring Physician: Treating Physician/Extender: 08/16/2020 HEDGECO CK, SUZA NNE Weeks in Treatment: 5 Education Assessment Education Provided To: Patient Education Topics Provided Wound/Skin Impairment: Handouts: Skin Care Do's and Dont's Methods: Explain/Verbal Responses: Reinforcements needed Electronic Signature(s) Signed: 08/14/2020 4:55:33 PM By: [id number] Entered By: 10/06/1941 on 08/14/2020 13:09:33 -------------------------------------------------------------------------------- Vitals Details Patient  Name: Date of Service: PO [place], [name] 08/14/2020 1:00 PM Medical Record Number: [id number] Patient Account Number: [id number] Date of Birth/Sex: Treating RN: [birth year] (79 y.o. Isabel Barnes Primary Care Iraida Cragin: HEDGECO CK, SUZA NNE Other Clinician: Referring Moyinoluwa Dawe: Treating Elwin Tsou/Extender: Baltazar Najjar HEDGECO CK, SUZA NNE Weeks in Treatment: 5 Vital Signs Time Taken: 13:14 Temperature (F): 98.5 Height (in): 62 Pulse (bpm): 84 Weight (lbs): 135 Respiratory Rate (breaths/min): 18 Body Mass Index (BMI): 24.7 Blood Pressure (mmHg): 145/71 Reference Range: 80 - 120 mg / dl Electronic Signature(s) Signed: 08/14/2020 4:34:39 PM By: Cherylin Mylar Entered By: Cherylin Mylar on 08/14/2020 13:16:01

## 2020-08-14 NOTE — Progress Notes (Signed)
Isabel Barnes, Isabel Barnes ([id number]) Visit Report for 08/14/2020 HPI Details Patient Name: Date of Service: PO [place], [name] 08/14/2020 1:00 PM Medical Record Number: [id number] Patient Account Number: [id number] Date of Birth/Sex: Treating RN: 1941/01/08 (79 y.o. Arta Silence Primary Care Provider: HEDGECO CK, SUZA NNE Other Clinician: Referring Provider: Treating Provider/Extender: Baltazar Najjar HEDGECO CK, SUZA NNE Weeks in Treatment: 5 History of Present Illness HPI Description: ADMISSION 04/12/2019 This is a pleasant 79 year old woman who comes in referred by her primary physician for review of a reopening of an original wound on the right lateral calf. The original wound was trauma against her dishwasher door on January 24. She was seen in an urgent care locally she was not sutured however she did get compression wraps and then this healed over. She states about a month ago there was increased swelling in the area reopened. She is here for our review of this. 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 Her past history is a bit complicated as well. She had a right total knee replacement 2 years ago and has had edema in the right greater than left leg since. She had a duplex ultrasound of the right leg 2 years ago that was negative for a DVT However she continues to have significant edema in the right greater than . left lower leg. She started to develop erythema in the right leg about a month ago although there is no pain unless she touches the area. She was put on clindamycin yesterday by her primary doctor. She also tells me that she has heart murmurs for many years and she has been referred by her primary doctor to cardiology but she does not yet have an appointment. She takes Lasix 20 mg a day. Past medical history includes hyperthyroidism, hypertension, widespread arthritis probably osteoarthritis,  right total knee replacement, neck surgery, low back surgery, chronic pain. ABI in our clinic was 1.17 on the right 5/21-Patient was initiated on treatment at the wound center last week for a nonhealing right calf wound. 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 She was placed in 3 layer compression with silver alginate dressings and ABD on the wounds last week.Ulcer looks smaller 5/28; 3 layer compression with silver alginate to continue. Duplex ultrasound study ordered last week were apparently negative according to the patient although she had this done at a [facility] imaging site and I do not see the results in care everywhere. In any case they apparently saw a an area of superficial phlebitis on the right thigh and she has been using topical NSAIDs [Voltaren] and she states that area feels a lot better. She is going for an echocardiogram on June 4 and sees the cardiologist the same day 6/4; using 3 layer compression with silver alginate. Much better edema control still a small open area. She brought in her ultrasound from last week which showed a age indeterminant occlusive superficial thrombophlebitis involving the greater saphenous vein at the level of the proximal thigh. The comment was made that there was no extension of this to the deep system. Presumably not felt to be at risk for deep vein migration. She also had her echocardiogram. I did not go over all of this in detail although she has a vigorous EF of 65%. Her right ventricular systolic function was normal. There was no increased right ventricular  wall thickness. Right ventricular systolic pressure was mildly elevated at 38. She did not have significant valve disease. She had moderate thickening of the mitral valve but without measurable stenosis It would appear that her edema is not well explained by the duplex ultrasounds or her echocardiogram. I would wonder about venous imaging in her pelvis.  Her edema was mostly pitting. 6/11; almost healed today. Surface eschar removed small open area remaining she should be healed by next week. I changed her to calcium alginate under 3 layer compression 6/18; the patient is healed today. Fully epithelialized. This was initially trauma in the setting of chronic venous insufficiency. At that time she had quite a bit of swelling in her leg pitting edema. Some of this was probably secondary to chronic venous insufficiency however was not sure we could blame all of this. She was put on some diuretics by enlarge the edema has resolved READMISSION 07/10/2020 -Patient presents with wound to the left lateral calf that started out when she tripped and fell with laceration wound that happened about 2 weeks ago, she was seen by primary who prescribed some dressings including Xeroform patient had been using silver gel more lately she also finished a course of doxycycline and was referred to the wound clinic for further care She also had a small water blister on the right lateral calf that burst and led to a small open area Patient is ABIs today in the clinic include 1.17 on the right and 0.96 on the left Patient denies any fevers chills, denies any excessive swelling in her legs other than baseline, she uses a lymphedema pumps regularly every day, she also has foot problems with diminished pads and heel pain she got injected in the right heel for plantar fasciitis, History significant for hypertension, lymphedema, recent onset of recurrent falls and episodes of syncope from sitting to standing position 07/17/2020; patient was readmitted to the clinic after a difficult fall resulting in wounds to her bilateral lower extremities. She states this occurred in the setting of a bathroom renovation she is doing in her house. She has a fairly significant deep punched out area on the left upper lateral lower leg a much smaller and benign looking area on the right lower leg.  We put her in compression and use silver alginate bilaterally 8/26; the wounds on her right leg are closed. She has her new Farrow wraps to put on today. On the left she has an open area on the left lateral upper calf this is come down in size and looks quite good. We have been using silver alginate under compression 9/2; her wound on the left leg laterally is closed. We have good edema control under our compression. She comes in today with no open wound on the right leg which we put a Farrow wrap on last week but her leg is swollen and erythematous anteriorly. She says the erythema is not unusual. She is not complaining of any pain but she is concerned about blistering. She has compression pumps but she does not pump over the wraps themselves she has some form of other stocking on. She also says she is missed some days with the stocking at all. 9/16; the patient's original wound on the right lateral calf is closed. She has Farrow wraps stockings and external compression pumps that she is using twice a day for 45 minutes Electronic Signature(s) Signed: 08/14/2020 4:33:07 PM By: Baltazar Najjarobson, Ailah Barna MD Entered By: Baltazar Najjarobson, Sakira Dahmer on 08/14/2020 14:07:52 -------------------------------------------------------------------------------- Physical Exam Details  Patient Name: Date of Service: PO [place], [name] 08/14/2020 1:00 PM Medical Record Number: [id number] Patient Account Number: [id number] Date of Birth/Sex: Treating RN: 01-17-1941 (79 y.o. Arta Silence Primary Care Provider: HEDGECO CK, SUZA NNE Other Clinician: Referring Provider: Treating Provider/Extender: Baltazar Najjar HEDGECO CK, SUZA NNE Weeks in Treatment: 5 Constitutional Sitting or standing Blood Pressure is within target range for patient.. Pulse regular and within target range for patient.Marland Kitchen Respirations regular, non-labored and within target range.. Temperature is normal and within the target range for the patient.Marland Kitchen Appears in no  distress. Cardiovascular Pedal pulses on the left are palpable. Notes Wound exam; the area on the right lateral leg is closed. The leg has nonpitting edema with erythema. There is no tenderness there is no warmth. Electronic Signature(s) Signed: 08/14/2020 4:33:07 PM By: Baltazar Najjar MD Entered By: Baltazar Najjar on 08/14/2020 14:10:47 -------------------------------------------------------------------------------- Physician Orders Details Patient Name: Date of Service: [name], [name] 08/14/2020 1:00 PM Medical Record Number: [id number] Patient Account Number: [id number] Date of Birth/Sex: Treating RN: [birth year] (79 y.o. Arta Silence Primary Care Provider: HEDGECO CK, SUZA NNE Other Clinician: Referring Provider: Treating Provider/Extender: Baltazar Najjar HEDGECO CK, SUZA NNE Weeks in Treatment: 5 Verbal / Phone Orders: No Diagnosis Coding ICD-10 Coding Code Description L97.222 Non-pressure chronic ulcer of left calf with fat layer exposed L97.302 Non-pressure chronic ulcer of unspecified ankle with fat layer exposed I89.0 Lymphedema, not elsewhere classified I50.20 Unspecified systolic (congestive) heart failure Discharge From [facility] Services Discharge from Wound Care Center - call if any future wound care needs. Skin Barriers/Peri-Wound Care Moisturizing lotion - both legs daily Edema Control Avoid standing for long periods of time Elevate legs to the level of the heart or above for 30 minutes daily and/or when sitting, a frequency of: - throughout the day Exercise regularly Support Garment 20-30 mm/Hg pressure to: - Farrow wraps to both legs daily, apply first thing in the morning, remove at night before bed Segmental Compressive Device. - lymphedema pumps twice a day for 1 hour each time, may wear farrow wraps while pumping Electronic Signature(s) Signed: 08/14/2020 4:33:07 PM By: Baltazar Najjar MD Signed: 08/14/2020 4:55:33 PM By: Shawn Stall Entered By:  Shawn Stall on 08/14/2020 13:23:33 -------------------------------------------------------------------------------- Problem List Details Patient Name: Date of Service: [name], [name] 08/14/2020 1:00 PM Medical Record Number: [id number] Patient Account Number: [id number] Date of Birth/Sex: Treating RN: [birth year] (79 y.o. Arta Silence Primary Care Provider: HEDGECO CK, SUZA NNE Other Clinician: Referring Provider: Treating Provider/Extender: Baltazar Najjar HEDGECO CK, SUZA NNE Weeks in Treatment: 5 Active Problems ICD-10 Encounter Code Description Active Date MDM Diagnosis L97.222 Non-pressure chronic ulcer of left calf with fat layer exposed 07/17/2020 No Yes L97.302 Non-pressure chronic ulcer of unspecified ankle with fat layer exposed 07/10/2020 No Yes I89.0 Lymphedema, not elsewhere classified 07/10/2020 No Yes I50.20 Unspecified systolic (congestive) heart failure 07/10/2020 No Yes Inactive Problems Resolved Problems Electronic Signature(s) Signed: 08/14/2020 4:33:07 PM By: Baltazar Najjar MD Entered By: Baltazar Najjar on 08/14/2020 14:06:49 -------------------------------------------------------------------------------- Progress Note Details Patient Name: Date of Service: [name], [name] 08/14/2020 1:00 PM Medical Record Number: [id number] Patient Account Number: [id number] Date of Birth/Sex: Treating RN: 1941/07/12 (79 y.o. Arta Silence Primary Care Provider: HEDGECO CK, SUZA NNE Other Clinician: Referring Provider: Treating Provider/Extender: Baltazar Najjar HEDGECO CK, SUZA NNE Weeks in Treatment: 5 Subjective History of Present Illness (HPI) ADMISSION 04/12/2019 This is a pleasant 79 year old woman who comes in referred by her primary physician for  review of a reopening of an original wound on the right lateral calf. The original wound was trauma against her dishwasher door on January 24. She was seen in an urgent care locally she was not sutured  however she did get compression wraps and then this healed over. She states about a month ago there was increased swelling in the area reopened. She is here for our review of this. 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 Her past history is a bit complicated as well. She had a right total knee replacement 2 years ago and has had edema in the right greater than left leg since. She had a duplex ultrasound of the right leg 2 years ago that was negative for a DVT However she continues to have significant edema in the right greater than . left lower leg. She started to develop erythema in the right leg about a month ago although there is no pain unless she touches the area. She was put on clindamycin yesterday by her primary doctor. She also tells me that she has heart murmurs for many years and she has been referred by her primary doctor to cardiology but she does not yet have an appointment. She takes Lasix 20 mg a day. Past medical history includes hyperthyroidism, hypertension, widespread arthritis probably osteoarthritis, right total knee replacement, neck surgery, low back surgery, chronic pain. ABI in our clinic was 1.17 on the right 5/21-Patient was initiated on treatment at the wound center last week for a nonhealing right calf wound. 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 She was placed in 3 layer compression with silver alginate dressings and ABD on the wounds last week.Ulcer looks smaller 5/28; 3 layer compression with silver alginate to continue. Duplex ultrasound study ordered last week were apparently negative according to the patient although she had this done at a [facility] imaging site and I do not see the results in care everywhere. In any case they apparently saw a an area of superficial phlebitis on the right thigh and she has been using topical NSAIDs [Voltaren]  and she states that area feels a lot better. She is going for an echocardiogram on June 4 and sees the cardiologist the same day 6/4; using 3 layer compression with silver alginate. Much better edema control still a small open area. She brought in her ultrasound from last week which showed a age indeterminant occlusive superficial thrombophlebitis involving the greater saphenous vein at the level of the proximal thigh. The comment was made that there was no extension of this to the deep system. Presumably not felt to be at risk for deep vein migration. She also had her echocardiogram. I did not go over all of this in detail although she has a vigorous EF of 65%. Her right ventricular systolic function was normal. There was no increased right ventricular wall thickness. Right ventricular systolic pressure was mildly elevated at 38. She did not have significant valve disease. She had moderate thickening of the mitral valve but without measurable stenosis It would appear that her edema is not well explained by the duplex ultrasounds or her echocardiogram. I would wonder about venous imaging in her pelvis. Her edema was mostly pitting. 6/11; almost healed today. Surface eschar removed small open area remaining she should be healed by next week. I changed her to calcium alginate under 3  layer compression 6/18; the patient is healed today. Fully epithelialized. This was initially trauma in the setting of chronic venous insufficiency. At that time she had quite a bit of swelling in her leg pitting edema. Some of this was probably secondary to chronic venous insufficiency however was not sure we could blame all of this. She was put on some diuretics by enlarge the edema has resolved READMISSION 07/10/2020 -Patient presents with wound to the left lateral calf that started out when she tripped and fell with laceration wound that happened about 2 weeks ago, she was seen by primary who prescribed some dressings  including Xeroform patient had been using silver gel more lately she also finished a course of doxycycline and was referred to the wound clinic for further care She also had a small water blister on the right lateral calf that burst and led to a small open area Patient is ABIs today in the clinic include 1.17 on the right and 0.96 on the left Patient denies any fevers chills, denies any excessive swelling in her legs other than baseline, she uses a lymphedema pumps regularly every day, she also has foot problems with diminished pads and heel pain she got injected in the right heel for plantar fasciitis, History significant for hypertension, lymphedema, recent onset of recurrent falls and episodes of syncope from sitting to standing position 07/17/2020; patient was readmitted to the clinic after a difficult fall resulting in wounds to her bilateral lower extremities. She states this occurred in the setting of a bathroom renovation she is doing in her house. She has a fairly significant deep punched out area on the left upper lateral lower leg a much smaller and benign looking area on the right lower leg. We put her in compression and use silver alginate bilaterally 8/26; the wounds on her right leg are closed. She has her new Farrow wraps to put on today. On the left she has an open area on the left lateral upper calf this is come down in size and looks quite good. We have been using silver alginate under compression 9/2; her wound on the left leg laterally is closed. We have good edema control under our compression. She comes in today with no open wound on the right leg which we put a Farrow wrap on last week but her leg is swollen and erythematous anteriorly. She says the erythema is not unusual. She is not complaining of any pain but she is concerned about blistering. She has compression pumps but she does not pump over the wraps themselves she has some form of other stocking on. She also says she is  missed some days with the stocking at all. 9/16; the patient's original wound on the right lateral calf is closed. She has Farrow wraps stockings and external compression pumps that she is using twice a day for 45 minutes Objective Constitutional Sitting or standing Blood Pressure is within target range for patient.. Pulse regular and within target range for patient.Marland Kitchen Respirations regular, non-labored and within target range.. Temperature is normal and within the target range for the patient.Marland Kitchen Appears in no distress. Vitals Time Taken: 1:14 PM, Height: 62 in, Weight: 135 lbs, BMI: 24.7, Temperature: 98.5 F, Pulse: 84 bpm, Respiratory Rate: 18 breaths/min, Blood Pressure: 145/71 mmHg. Cardiovascular Pedal pulses on the left are palpable. General Notes: Wound exam; the area on the right lateral leg is closed. The leg has nonpitting edema with erythema. There is no tenderness there is no warmth. Assessment Active  Problems ICD-10 Non-pressure chronic ulcer of left calf with fat layer exposed Non-pressure chronic ulcer of unspecified ankle with fat layer exposed Lymphedema, not elsewhere classified Unspecified systolic (congestive) heart failure Plan Discharge From [facility] Services: Discharge from Wound Care Center - call if any future wound care needs. Skin Barriers/Peri-Wound Care: Moisturizing lotion - both legs daily Edema Control: Avoid standing for long periods of time Elevate legs to the level of the heart or above for 30 minutes daily and/or when sitting, a frequency of: - throughout the day Exercise regularly Support Garment 20-30 mm/Hg pressure to: - Farrow wraps to both legs daily, apply first thing in the morning, remove at night before bed Segmental Compressive Device. - lymphedema pumps twice a day for 1 hour each time, may wear farrow wraps while pumping 1. The patient's wound is closed. 2. She still has lymphedema in the right lower leg and erythema but no warmth or  tenderness. I think this represents stasis dermatitis 3. She has her compression stockings and external compression pumps she is using them adequately. She states that the erythema in her leg comes and goes. I was hoping to be able to get this better with removing some of the fluid out of the distal leg but so far this is about the same. Her wound however has closed 4. I think she can be discharged from the clinic I think following this expediently is going to be in order. Hopefully with the compression stockings and the compression pump usage we will avoid skin breakdown in the wounds Electronic Signature(s) Signed: 08/14/2020 4:33:07 PM By: Baltazar Najjar MD Entered By: Baltazar Najjar on 08/14/2020 14:12:07 -------------------------------------------------------------------------------- SuperBill Details Patient Name: Date of Service: [name], [name] 08/14/2020 Medical Record Number: [id number] Patient Account Number: [id number] Date of Birth/Sex: Treating RN: [birth year] (79 y.o. Arta Silence Primary Care Provider: HEDGECO CK, SUZA NNE Other Clinician: Referring Provider: Treating Provider/Extender: Baltazar Najjar HEDGECO CK, SUZA NNE Weeks in Treatment: 5 Diagnosis Coding ICD-10 Codes Code Description [phone] Non-pressure chronic ulcer of left calf with fat layer exposed L97.302 Non-pressure chronic ulcer of unspecified ankle with fat layer exposed I89.0 Lymphedema, not elsewhere classified I50.20 Unspecified systolic (congestive) heart failure Facility Procedures CPT4 Code: [id number] Description: 17494 - WOUND CARE VISIT-LEV 2 EST PT Modifier: Quantity: 1 Physician Procedures : CPT4 Code Description Modifier [id number] - WC PHYS LEVEL 3 - EST PT ICD-10 Diagnosis Description I89.0 Lymphedema, not elsewhere classified L97.302 Non-pressure chronic ulcer of unspecified ankle with fat layer exposed L97.222 Non-pressure chronic  ulcer of left calf with fat layer  exposed Quantity: 1 Electronic Signature(s) Signed: 08/14/2020 4:33:07 PM By: Baltazar Najjar MD Entered By: Baltazar Najjar on 08/14/2020 14:12:43

## 2020-09-02 ENCOUNTER — Encounter: Payer: Self-pay | Admitting: Cardiovascular Disease

## 2020-09-02 ENCOUNTER — Other Ambulatory Visit: Payer: Self-pay

## 2020-09-02 ENCOUNTER — Ambulatory Visit (INDEPENDENT_AMBULATORY_CARE_PROVIDER_SITE_OTHER): Payer: Medicare Other | Admitting: Cardiovascular Disease

## 2020-09-02 VITALS — BP 125/61 | HR 90 | Ht 62.0 in | Wt 137.8 lb

## 2020-09-02 DIAGNOSIS — I10 Essential (primary) hypertension: Secondary | ICD-10-CM

## 2020-09-02 DIAGNOSIS — I872 Venous insufficiency (chronic) (peripheral): Secondary | ICD-10-CM

## 2020-09-02 DIAGNOSIS — M79604 Pain in right leg: Secondary | ICD-10-CM | POA: Diagnosis not present

## 2020-09-02 DIAGNOSIS — I350 Nonrheumatic aortic (valve) stenosis: Secondary | ICD-10-CM

## 2020-09-02 DIAGNOSIS — R0989 Other specified symptoms and signs involving the circulatory and respiratory systems: Secondary | ICD-10-CM

## 2020-09-02 NOTE — Progress Notes (Signed)
Cardiology Office Note   Date:  09/02/2020   ID:  Isabel Barnes, Isabel Barnes 12-Jun-1941, MRN [id number]  PCP:  Jamal Collin, PA-C  Cardiologist:   Lorine Bears, MD   No chief complaint on file.     History of Present Illness: Isabel Barnes is a 79 y.o. 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  In addition, she has mild aortic stenosis.   She has known history of hypertension controlled with losartan.  In addition, she has extensive arthritis, spondylosis and chronic pain.  She is a previous smoker and has no history of diabetes.   She is known to have severe chronic venous insufficiency with secondary lymphedema especially on the right side with associated superficial wounds.  Previous lower extremity venous Doppler showed no evidence of DVT.  Echocardiogram in 2020 showed normal LV systolic function with grade 2 diastolic dysfunction, mild pulmonary hypertension and mild aortic stenosis.  The patient was treated with leg elevation, knee-high support stockings and lymphedema pump.  Recently, she developed an ulceration at the bottom of the right heel which has been slow to heal.  She has bilateral foot pain that correlates with the swelling and is worse on the right side.  No chest pain or shortness of breath.    Past Medical History:  Diagnosis Date  . Anemia    iron deficiency hx.has had iron infusions before   . Chronic low back pain   . Chronic pain disorder   . Complication of anesthesia    severe claustrophobia  . Constipation    r/t use of pain meds.Takes OTC meds or eats prunes  . GERD (gastroesophageal reflux disease)    takes Omeprazole daily  . Heart murmur    "slight"  . History of bronchitis    20+ yrs ago  . History of kidney stones   . History of prolapse of bladder   . History of shingles   . Hypertension    takes Losartan daily  . Hypothyroidism     takes Synthroid daily  . Joint swelling   . Neck pain    bone spurs at base of head per pt  . Osteoarthritis    lumbar,cervical,joints  . Pneumonia    hx of > 20 yrs ago  . Shortness of breath    occasionally and with exertion. Albuterol inhaler as needed  . Spinal headache 1991   blood patch placed  . Spondylitis (HCC)   . Unsteady gait    occasionally  . Urinary urgency     Past Surgical History:  Procedure Laterality Date  . ABDOMINAL HYSTERECTOMY    . ANTERIOR FUSION CERVICAL SPINE     x2 -C4-7  . BUNIONECTOMY Bilateral   . COLONOSCOPY    . CYSTOSCOPY W/ URETEROSCOPY  2012  . EYE SURGERY Bilateral    cataract /lens implant  . HOLMIUM LASER APPLICATION Left 02/08/2013   Procedure: HOLMIUM LASER APPLICATION;  Surgeon: Anner Crete, MD;  Location: [facility];  Service: Urology;  Laterality: Left;  . INSERTION OF MESH N/A 07/15/2014   Procedure: INSERTION OF MESH;  Surgeon: Ardeth Sportsman, MD;  Location: [facility] OR;  Service: General;  Laterality: N/A;  . JOINT REPLACEMENT Right 2012   shoulder  . LAPAROSCOPIC CHOLECYSTECTOMY W/ CHOLANGIOGRAPHY  2012   Dr Magnus Ivan  . NASAL SINUS SURGERY    . RADIOLOGY WITH ANESTHESIA N/A 05/09/2014   Procedure:  ADULT SEDATION WITH ANESTHESIA/MRI CERVICAL SPINE WITHOUT CONTRAST;  Surgeon: Medication Radiologist, MD;  Location: [facility] OR;  Service: Radiology;  Laterality: N/A;  DR. HAWKS/MRI  . right knee arthroscopy     d/t meniscal tear  . SHOULDER ARTHROSCOPY W/ ROTATOR CUFF REPAIR Bilateral three times each over several yrs  . THUMB ARTHROSCOPY Left   . 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 UMBILICAL HERNIA REPAIR N/A 07/15/2014   Procedure: LAPAROSCOPIC UMBILICAL AND INFRAUMBILICAL HERNIA;  Surgeon: Ardeth Sportsman, MD;  Location: [facility] OR;  Service: General;  Laterality: N/A;     Current  Outpatient Medications  Medication Sig Dispense Refill  . albuterol (PROAIR HFA) 108 (90 BASE) MCG/ACT inhaler Inhale 2 puffs into the lungs every 4 (four) hours as needed for shortness of breath.     Marland Kitchen aspirin EC 81 MG tablet Take 81 mg by mouth daily.    . calcium-vitamin D (OSCAL WITH D) 500-200 MG-UNIT per tablet Take 1 tablet by mouth every morning.     . carboxymethylcellulose (EQ RESTORE TEARS) 0.5 % SOLN Place 1 drop into both eyes daily.    . cholecalciferol (VITAMIN D) 1000 UNITS tablet Take 1,000 Units by mouth daily.     . diclofenac sodium (VOLTAREN) 1 % GEL Apply 2 g topically 2 (two) times daily as needed (For pain.).     Marland Kitchen Digestive Enzymes (ENZYME DIGEST PO) Take 1 tablet by mouth daily.     . diphenhydrAMINE (BENADRYL) 25 MG tablet Take 25 mg by mouth every 6 (six) hours as needed for allergies.     Marland Kitchen ENSURE PLUS (ENSURE PLUS) LIQD Take 237 mLs by mouth daily as needed.     . ezetimibe (ZETIA) 10 MG tablet Take 10 mg by mouth daily.    . fish oil-omega-3 fatty acids 1000 MG capsule Take 1,000 mg by mouth 2 (two) times daily.     . furosemide (LASIX) 20 MG tablet Take 20 mg by mouth daily.    Marland Kitchen HYDROcodone-acetaminophen (NORCO) 10-325 MG tablet Take 1-2 tablets by mouth every 6 (six) hours as needed for severe pain. 50 tablet 0  . levothyroxine (SYNTHROID, LEVOTHROID) 50 MCG tablet Take 50 mcg by mouth every morning.     . loperamide (IMODIUM A-D) 2 MG tablet Take 4 mg by mouth as needed for diarrhea or loose stools.     Marland Kitchen losartan (COZAAR) 25 MG tablet Take 25 mg by mouth daily.     . magnesium 30 MG tablet Take 30 mg by mouth daily.     . methocarbamol (ROBAXIN) 500 MG tablet Take 500 mg by mouth 3 (three) times daily as needed.    . Multiple Vitamins-Minerals (MULTIVITAMIN PO) Take 1 tablet by mouth daily.    Marland Kitchen omeprazole (PRILOSEC) 20 MG capsule Take 20 mg by mouth daily.     . ondansetron (ZOFRAN) 8 MG tablet Take 1 tablet (8 mg total) by mouth every 8 (eight) hours as  needed for nausea or vomiting. 20 tablet 0  . POTASSIUM PO Take 1 tablet by mouth daily.    . predniSONE (DELTASONE) 5 MG tablet Take 5 mg by mouth daily.    . Probiotic Product (PROBIOTIC DAILY PO) Take 1 capsule by mouth daily.     . prochlorperazine (COMPAZINE) 10 MG tablet Take 5 mg by mouth every 8 (eight) hours as needed (For  nausea.).     Marland Kitchen prochlorperazine (COMPAZINE) 10 MG tablet Take 1 tablet (10 mg total) by mouth 2 (two) times daily as needed for nausea or vomiting (or headache, may take with benadryl 25mg ). 10 tablet 0  . RESTASIS 0.05 % ophthalmic emulsion 1 drop 2 (two) times daily.    triamcinolone (NASACORT) 55 MCG/ACT AERO nasal inhaler Place into the nose.    . Turmeric 500 MG CAPS Take 500 mg by mouth at bedtime.     . vitamin B-12 (CYANOCOBALAMIN) 250 MCG tablet Take 250 mcg by mouth at bedtime.      No current facility-administered medications for this visit.    Allergies:   Dilaudid [hydromorphone hcl], Gabapentin, Lyrica [pregabalin], Oxycodone, Singulair [montelukast sodium], Banana, Ciprofloxacin hcl, Codeine, Methadone, Metronidazole, Oysters [shellfish allergy], Clindamycin/lincomycin, Sulfa antibiotics, Betadine [povidone iodine], Latex, Nucynta [tapentadol], Other, Penicillins, and Sulfur    Social History:  The patient  reports that she quit smoking about 28 years ago. Her smoking use included cigarettes. She has a 1.00 pack-year smoking history. She has never used smokeless tobacco. She reports that she does not drink alcohol and does not use drugs.   Family History:  The patient's family history includes Stroke in her father.    ROS:  Please see the history of present illness.   Otherwise, review of systems are positive for none.   All other systems are reviewed and negative.    PHYSICAL EXAM: VS:  BP 125/61   Pulse 90   Ht 5\' 2"  (1.575 m)   Wt 137 lb 12.8 oz (62.5 kg)   SpO2 98%   BMI 25.20 kg/m  , BMI Body mass index is 25.2 kg/m. GEN: Well  nourished, well developed, in no acute distress  HEENT: normal  Neck: no JVD,  or masses.  Bilateral carotid bruits. Cardiac: RRR; no  rubs, or gallops.  2/ 6 crescendo decrescendo systolic murmur in the aortic area which is early peaking. there is moderate to severe bilateral leg edema with stasis dermatitis worse on the right side.  Respiratory:  clear to auscultation bilaterally, normal work of breathing GI: soft, nontender, nondistended, + BS MS: no deformity or atrophy  Skin: warm and dry, no rash Neuro:  Strength and sensation are intact Psych: euthymic mood, full affect Vascular: Difficult to palpate distal pulses could be due to edema.  Previously, her posterior tibial pulse was palpable. Small superficial ulceration on the right heel.  EKG:  EKG is not ordered today.    Recent Labs: No results found for requested labs within last 8760 hours.    Lipid Panel No results found for: CHOL, TRIG, HDL, CHOLHDL, VLDL, LDLCALC, LDLDIRECT    Wt Readings from Last 3 Encounters:  09/02/20 137 lb 12.8 oz (62.5 kg)  01/01/20 142 lb (64.4 kg)  12/13/19 147 lb (66.7 kg)        No flowsheet data found.    ASSESSMENT AND PLAN:   1.  Chronic venous insufficiency with secondary lymphedema and recurrent venous wounds: Continue with support stockings, leg elevation and lymphedema pump.    2.  Slow healing ulceration on the right heel: We should check her arterial circulation and thus I requested lower extremity arterial Doppler.  3.  Bilateral carotid bruits: I requested carotid Doppler.  4. Aortic stenosis: This was mild on echocardiogram from June 2020.  Recommend repeat echocardiogram in June 2022  5. Essential hypertension: Blood pressure is controlled.  Continue losartan.  4. Hyperlipidemia: She reports intolerance  to Zetia due to GI symptoms.  She has not been on a statin and given suspected carotid disease, should consider treatment with atorvastatin or  rosuvastatin.    Disposition:   FU with me in 6 months  Signed,  Lorine BearsMuhammad Leilynn Pilat, MD  09/02/2020 10:03 AM    [facility]

## 2020-09-02 NOTE — Patient Instructions (Signed)
Medication Instructions:  No changes *If you need a refill on your cardiac medications before your next appointment, please call your pharmacy*   Lab Work: None ordered If you have labs (blood work) drawn today and your tests are completely normal, you will receive your results only by: Marland Kitchen MyChart Message (if you have MyChart) OR . A paper copy in the mail If you have any lab test that is abnormal or we need to change your treatment, we will call you to review the results.   Testing/Procedures: Your physician has requested that you have a carotid duplex. This test is an ultrasound of the carotid arteries in your neck. It looks at blood flow through these arteries that supply the brain with blood. Allow one hour for this exam. There are no restrictions or special instructions. This will take place at [address] [facility], [address].  Your physician has requested that you have a lower extremity segmental doppler. This will take place at [address] [facility], [address].  Follow-Up: At [facility], you and your health needs are our priority.  As part of our continuing mission to provide you with exceptional heart care, we have created designated Provider Care Teams.  These Care Teams include your primary Cardiologist (physician) and Advanced Practice Providers (APPs -  Physician Assistants and Nurse Practitioners) who all work together to provide you with the care you need, when you need it.  We recommend signing up for the patient portal called "MyChart".  Sign up information is provided on this After Visit Summary.  MyChart is used to connect with patients for Virtual Visits (Telemedicine).  Patients are able to view lab/test results, encounter notes, upcoming appointments, etc.  Non-urgent messages can be sent to your provider as well.   To learn more about what you can do with MyChart, go to [email or web address].    Your next appointment:   6 month(s)  The format for your next  appointment:   In Person  Provider:   Lorine Bears, MD

## 2020-09-09 ENCOUNTER — Other Ambulatory Visit (HOSPITAL_COMMUNITY): Payer: Self-pay | Admitting: Cardiovascular Disease

## 2020-09-09 DIAGNOSIS — I739 Peripheral vascular disease, unspecified: Secondary | ICD-10-CM

## 2020-09-23 ENCOUNTER — Ambulatory Visit (HOSPITAL_BASED_OUTPATIENT_CLINIC_OR_DEPARTMENT_OTHER)
Admission: RE | Admit: 2020-09-23 | Discharge: 2020-09-23 | Disposition: A | Discharge status: Home / Self Care | Payer: Medicare Other | Source: Ambulatory Visit | Attending: Cardiovascular Disease | Admitting: Cardiovascular Disease

## 2020-09-23 ENCOUNTER — Other Ambulatory Visit: Payer: Self-pay

## 2020-09-23 ENCOUNTER — Ambulatory Visit (HOSPITAL_COMMUNITY)
Admission: RE | Admit: 2020-09-23 | Discharge: 2020-09-23 | Disposition: A | Discharge status: Home / Self Care | Payer: Medicare Other | Source: Ambulatory Visit | Attending: Cardiovascular Disease | Admitting: Cardiovascular Disease

## 2020-09-23 DIAGNOSIS — M79604 Pain in right leg: Secondary | ICD-10-CM | POA: Diagnosis not present

## 2020-09-23 DIAGNOSIS — R0989 Other specified symptoms and signs involving the circulatory and respiratory systems: Secondary | ICD-10-CM

## 2020-09-23 DIAGNOSIS — I739 Peripheral vascular disease, unspecified: Secondary | ICD-10-CM | POA: Diagnosis present

## 2020-09-25 ENCOUNTER — Other Ambulatory Visit: Payer: Self-pay | Admitting: Orthopaedic Surgery

## 2020-09-25 DIAGNOSIS — M25571 Pain in right ankle and joints of right foot: Secondary | ICD-10-CM

## 2020-10-18 ENCOUNTER — Other Ambulatory Visit: Payer: Self-pay

## 2020-10-18 ENCOUNTER — Ambulatory Visit
Admission: RE | Admit: 2020-10-18 | Discharge: 2020-10-18 | Disposition: A | Discharge status: Home / Self Care | Payer: Medicare Other | Source: Ambulatory Visit | Attending: Orthopaedic Surgery | Admitting: Orthopaedic Surgery

## 2020-10-18 DIAGNOSIS — M25571 Pain in right ankle and joints of right foot: Secondary | ICD-10-CM

## 2020-12-05 ENCOUNTER — Other Ambulatory Visit: Payer: Self-pay | Admitting: Orthopedic Surgery

## 2020-12-05 DIAGNOSIS — G44309 Post-traumatic headache, unspecified, not intractable: Secondary | ICD-10-CM

## 2020-12-19 ENCOUNTER — Other Ambulatory Visit: Payer: Medicare Other

## 2020-12-22 ENCOUNTER — Ambulatory Visit
Admission: RE | Admit: 2020-12-22 | Discharge: 2020-12-22 | Disposition: A | Discharge status: Home / Self Care | Payer: Medicare Other | Source: Ambulatory Visit | Attending: Orthopedic Surgery | Admitting: Orthopedic Surgery

## 2020-12-22 DIAGNOSIS — G44309 Post-traumatic headache, unspecified, not intractable: Secondary | ICD-10-CM

## 2021-01-16 ENCOUNTER — Other Ambulatory Visit: Payer: Self-pay | Admitting: Family Medicine

## 2021-01-16 DIAGNOSIS — M5440 Lumbago with sciatica, unspecified side: Secondary | ICD-10-CM

## 2021-01-23 ENCOUNTER — Ambulatory Visit
Admission: RE | Admit: 2021-01-23 | Discharge: 2021-01-23 | Disposition: A | Discharge status: Home / Self Care | Payer: Medicare Other | Source: Ambulatory Visit | Attending: Family Medicine | Admitting: Family Medicine

## 2021-01-23 DIAGNOSIS — M5440 Lumbago with sciatica, unspecified side: Secondary | ICD-10-CM

## 2021-03-09 ENCOUNTER — Other Ambulatory Visit: Payer: Self-pay | Admitting: Orthopedic Surgery

## 2021-03-16 ENCOUNTER — Encounter (HOSPITAL_COMMUNITY): Payer: Self-pay

## 2021-03-16 ENCOUNTER — Other Ambulatory Visit: Payer: Self-pay

## 2021-03-16 ENCOUNTER — Observation Stay (HOSPITAL_COMMUNITY)
Admission: EM | Admit: 2021-03-16 | Discharge: 2021-03-17 | Disposition: A | Discharge status: Home / Self Care | Payer: Medicare Other | Attending: Internal Medicine | Admitting: Internal Medicine

## 2021-03-16 ENCOUNTER — Emergency Department (HOSPITAL_COMMUNITY): Payer: Medicare Other

## 2021-03-16 DIAGNOSIS — I10 Essential (primary) hypertension: Secondary | ICD-10-CM | POA: Diagnosis not present

## 2021-03-16 DIAGNOSIS — Z20822 Contact with and (suspected) exposure to covid-19: Secondary | ICD-10-CM | POA: Insufficient documentation

## 2021-03-16 DIAGNOSIS — R778 Other specified abnormalities of plasma proteins: Secondary | ICD-10-CM

## 2021-03-16 DIAGNOSIS — Z91040 Latex allergy status: Secondary | ICD-10-CM | POA: Diagnosis not present

## 2021-03-16 DIAGNOSIS — Z79899 Other long term (current) drug therapy: Secondary | ICD-10-CM | POA: Insufficient documentation

## 2021-03-16 DIAGNOSIS — Z96651 Presence of right artificial knee joint: Secondary | ICD-10-CM | POA: Diagnosis not present

## 2021-03-16 DIAGNOSIS — Z87891 Personal history of nicotine dependence: Secondary | ICD-10-CM | POA: Diagnosis not present

## 2021-03-16 DIAGNOSIS — E785 Hyperlipidemia, unspecified: Secondary | ICD-10-CM | POA: Diagnosis present

## 2021-03-16 DIAGNOSIS — Z96611 Presence of right artificial shoulder joint: Secondary | ICD-10-CM | POA: Insufficient documentation

## 2021-03-16 DIAGNOSIS — R079 Chest pain, unspecified: Secondary | ICD-10-CM | POA: Diagnosis present

## 2021-03-16 DIAGNOSIS — I350 Nonrheumatic aortic (valve) stenosis: Secondary | ICD-10-CM | POA: Diagnosis not present

## 2021-03-16 DIAGNOSIS — R072 Precordial pain: Secondary | ICD-10-CM | POA: Diagnosis present

## 2021-03-16 DIAGNOSIS — I872 Venous insufficiency (chronic) (peripheral): Secondary | ICD-10-CM | POA: Diagnosis present

## 2021-03-16 DIAGNOSIS — R0789 Other chest pain: Principal | ICD-10-CM | POA: Insufficient documentation

## 2021-03-16 DIAGNOSIS — G894 Chronic pain syndrome: Secondary | ICD-10-CM | POA: Diagnosis present

## 2021-03-16 DIAGNOSIS — E039 Hypothyroidism, unspecified: Secondary | ICD-10-CM | POA: Diagnosis present

## 2021-03-16 LAB — CBC WITH DIFFERENTIAL/PLATELET
Abs Immature Granulocytes: 0.02 10*3/uL (ref 0.00–0.07)
Basophils Absolute: 0.1 10*3/uL (ref 0.0–0.1)
Basophils Relative: 1 %
Eosinophils Absolute: 0.1 10*3/uL (ref 0.0–0.5)
Eosinophils Relative: 2 %
HCT: 34.2 % — ABNORMAL LOW (ref 36.0–46.0)
Hemoglobin: 10.7 g/dL — ABNORMAL LOW (ref 12.0–15.0)
Immature Granulocytes: 0 %
Lymphocytes Relative: 4 %
Lymphs Abs: 0.3 10*3/uL — ABNORMAL LOW (ref 0.7–4.0)
MCH: 29.5 pg (ref 26.0–34.0)
MCHC: 31.3 g/dL (ref 30.0–36.0)
MCV: 94.2 fL (ref 80.0–100.0)
Monocytes Absolute: 0.2 10*3/uL (ref 0.1–1.0)
Monocytes Relative: 3 %
Neutro Abs: 6.6 10*3/uL (ref 1.7–7.7)
Neutrophils Relative %: 90 %
Platelets: 197 10*3/uL (ref 150–400)
RBC: 3.63 MIL/uL — ABNORMAL LOW (ref 3.87–5.11)
RDW: 15 % (ref 11.5–15.5)
WBC: 7.3 10*3/uL (ref 4.0–10.5)
nRBC: 0 % (ref 0.0–0.2)

## 2021-03-16 LAB — RESP PANEL BY RT-PCR (FLU A&B, COVID) ARPGX2
Influenza A by PCR: NEGATIVE
Influenza B by PCR: NEGATIVE
SARS Coronavirus 2 by RT PCR: NEGATIVE

## 2021-03-16 LAB — LIPID PANEL
Cholesterol: 219 mg/dL — ABNORMAL HIGH (ref 0–200)
HDL: 79 mg/dL (ref >40)
LDL Cholesterol: 130 mg/dL — ABNORMAL HIGH (ref 0–99)
Total CHOL/HDL Ratio: 2.8 RATIO
Triglycerides: 48 mg/dL (ref <150)
VLDL: 10 mg/dL (ref 0–40)

## 2021-03-16 LAB — BASIC METABOLIC PANEL
Anion gap: 8 (ref 5–15)
BUN: 12 mg/dL (ref 8–23)
CO2: 24 mmol/L (ref 22–32)
Calcium: 8.9 mg/dL (ref 8.9–10.3)
Chloride: 110 mmol/L (ref 98–111)
Creatinine, Ser: 0.61 mg/dL (ref 0.44–1.00)
GFR, Estimated: >60 mL/min (ref >60)
Glucose, Bld: 102 mg/dL — ABNORMAL HIGH (ref 70–99)
Potassium: 3.8 mmol/L (ref 3.5–5.1)
Sodium: 142 mmol/L (ref 135–145)

## 2021-03-16 LAB — TROPONIN I (HIGH SENSITIVITY)
Troponin I (High Sensitivity): 51 ng/L — ABNORMAL HIGH (ref <18)
Troponin I (High Sensitivity): 109 ng/L (ref <18)

## 2021-03-16 MED ORDER — ROSUVASTATIN CALCIUM 20 MG PO TABS: 20.0000 mg | ORAL_TABLET | Freq: Every day | ORAL | Status: DC

## 2021-03-16 MED ORDER — DIPHENHYDRAMINE HCL 25 MG PO CAPS
50.0000 mg | ORAL_CAPSULE | Freq: Three times a day (TID) | ORAL | Status: DC | PRN
  Administered 2021-03-17: 50 mg via ORAL
  Filled 2021-03-16 (×2): qty 2

## 2021-03-16 MED ORDER — POLYVINYL ALCOHOL 1.4 % OP SOLN
1.0000 [drp] | Freq: Three times a day (TID) | OPHTHALMIC | Status: DC | PRN
  Filled 2021-03-16: qty 15

## 2021-03-16 MED ORDER — PREDNISONE 5 MG PO TABS
5.0000 mg | ORAL_TABLET | Freq: Every day | ORAL | Status: DC
  Administered 2021-03-17: 5 mg via ORAL
  Filled 2021-03-16 (×2): qty 1

## 2021-03-16 MED ORDER — ENOXAPARIN SODIUM 40 MG/0.4ML ~~LOC~~ SOLN
40.0000 mg | SUBCUTANEOUS | Status: DC
  Administered 2021-03-16: 40 mg via SUBCUTANEOUS
  Filled 2021-03-16: qty 0.4

## 2021-03-16 MED ORDER — DICLOFENAC SODIUM 1 % TD GEL
2.0000 g | Freq: Two times a day (BID) | TRANSDERMAL | Status: DC | PRN
  Filled 2021-03-16: qty 100

## 2021-03-16 MED ORDER — METHOCARBAMOL 500 MG PO TABS: 500.0000 mg | ORAL_TABLET | Freq: Three times a day (TID) | ORAL | Status: DC | PRN

## 2021-03-16 MED ORDER — PROMETHAZINE HCL 25 MG PO TABS: 12.5000 mg | ORAL_TABLET | Freq: Four times a day (QID) | ORAL | Status: DC | PRN

## 2021-03-16 MED ORDER — PROCHLORPERAZINE MALEATE 10 MG PO TABS
10.0000 mg | ORAL_TABLET | Freq: Two times a day (BID) | ORAL | Status: DC | PRN
  Filled 2021-03-16: qty 1

## 2021-03-16 MED ORDER — ALBUTEROL SULFATE (2.5 MG/3ML) 0.083% IN NEBU: 2.5000 mg | INHALATION_SOLUTION | RESPIRATORY_TRACT | Status: DC | PRN

## 2021-03-16 MED ORDER — LOSARTAN POTASSIUM 25 MG PO TABS: 25.0000 mg | ORAL_TABLET | Freq: Every day | ORAL | Status: DC

## 2021-03-16 MED ORDER — MAGNESIUM OXIDE 400 (241.3 MG) MG PO TABS
400.0000 mg | ORAL_TABLET | Freq: Every day | ORAL | Status: DC
  Administered 2021-03-17: 400 mg via ORAL
  Filled 2021-03-16: qty 1

## 2021-03-16 MED ORDER — HYDROCODONE-ACETAMINOPHEN 10-325 MG PO TABS
1.0000 | ORAL_TABLET | Freq: Four times a day (QID) | ORAL | Status: DC | PRN
  Administered 2021-03-16 – 2021-03-17 (×2): 1 via ORAL
  Filled 2021-03-16 (×2): qty 1

## 2021-03-16 MED ORDER — PANTOPRAZOLE SODIUM 40 MG PO TBEC
40.0000 mg | DELAYED_RELEASE_TABLET | Freq: Every day | ORAL | Status: DC
  Administered 2021-03-17: 40 mg via ORAL
  Filled 2021-03-16: qty 1

## 2021-03-16 MED ORDER — CYCLOSPORINE 0.05 % OP EMUL
1.0000 [drp] | Freq: Every day | OPHTHALMIC | Status: DC
  Administered 2021-03-17: 1 [drp] via OPHTHALMIC
  Filled 2021-03-16: qty 1

## 2021-03-16 MED ORDER — ASPIRIN EC 81 MG PO TBEC
81.0000 mg | DELAYED_RELEASE_TABLET | Freq: Every day | ORAL | Status: DC
  Administered 2021-03-17: 81 mg via ORAL
  Filled 2021-03-16: qty 1

## 2021-03-16 MED ORDER — ONDANSETRON HCL 4 MG/2ML IJ SOLN: 4.0000 mg | Freq: Four times a day (QID) | INTRAMUSCULAR | Status: DC | PRN

## 2021-03-16 MED ORDER — LEVOTHYROXINE SODIUM 50 MCG PO TABS
50.0000 ug | ORAL_TABLET | Freq: Every morning | ORAL | Status: DC
  Administered 2021-03-17: 50 ug via ORAL
  Filled 2021-03-16: qty 1

## 2021-03-16 MED ORDER — ACETAMINOPHEN 325 MG PO TABS
650.0000 mg | ORAL_TABLET | ORAL | Status: DC | PRN
  Filled 2021-03-16: qty 2

## 2021-03-16 MED ORDER — OMEGA-3-ACID ETHYL ESTERS 1 G PO CAPS
1000.0000 mg | ORAL_CAPSULE | Freq: Two times a day (BID) | ORAL | Status: DC
  Administered 2021-03-16 – 2021-03-17 (×2): 1000 mg via ORAL
  Filled 2021-03-16 (×2): qty 1

## 2021-03-16 MED ORDER — VITAMIN D 25 MCG (1000 UNIT) PO TABS
1000.0000 [IU] | ORAL_TABLET | Freq: Two times a day (BID) | ORAL | Status: DC
  Administered 2021-03-16 – 2021-03-17 (×2): 1000 [IU] via ORAL
  Filled 2021-03-16 (×2): qty 1

## 2021-03-16 NOTE — ED Provider Notes (Signed)
[facility] EMERGENCY DEPARTMENT Provider Note   CSN: [id number] Arrival date & time: 03/16/21  1622     History Chief Complaint  Patient presents with  . Chest Pain    Isabel Barnes is a 80 y.o. female.  The history is provided by the patient.  Chest Pain Pain location:  Substernal area Pain quality: aching   Pain severity:  Mild Onset quality:  Sudden Duration:  4 minutes Progression:  Resolved Chronicity:  New Context comment:  Patient developed chest pain shortly after getting nerve block to left upper arm for wrist surgery she was supposed to have today.  Symptoms lasted for 4 minutes and then resolved.  However during the event she had some dynamic changes on her EKG. Associated symptoms: no abdominal pain, no back pain, no cough, no fever, no palpitations, no shortness of breath and no vomiting        Past Medical History:  Diagnosis Date  . Anemia    iron deficiency hx.has had iron infusions before   . Chronic low back pain   . Chronic pain disorder   . Complication of anesthesia    severe claustrophobia  . Constipation    r/t use of pain meds.Takes OTC meds or eats prunes  . GERD (gastroesophageal reflux disease)    takes Omeprazole daily  . Heart murmur    "slight"  . History of bronchitis    20+ yrs ago  . History of kidney stones   . History of prolapse of bladder   . History of shingles   . Hypertension    takes Losartan daily  . Hypothyroidism    takes Synthroid daily  . Joint swelling   . Neck pain    bone spurs at base of head per pt  . Osteoarthritis    lumbar,cervical,joints  . Pneumonia    hx of > 20 yrs ago  . Shortness of breath    occasionally and with exertion. Albuterol inhaler as needed  . Spinal headache 1991   blood patch placed  . Spondylitis (HCC)   . Unsteady gait    occasionally  . Urinary urgency     Patient Active Problem List   Diagnosis Date Noted  . Short-term memory loss 12/13/2019  .  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 Chronic pain disorder   . Osteoarthritis   . Incisional umbilical hernia, without obstruction or gangrene   . Iron deficiency anemia 09/19/2012    Past Surgical History:  Procedure Laterality Date  . ABDOMINAL HYSTERECTOMY    . ANTERIOR FUSION CERVICAL SPINE     x2 -C4-7  . BUNIONECTOMY Bilateral   . COLONOSCOPY    . CYSTOSCOPY W/ URETEROSCOPY  2012  . EYE SURGERY Bilateral    cataract /lens implant  . HOLMIUM LASER APPLICATION Left 02/08/2013   Procedure: HOLMIUM LASER APPLICATION;  Surgeon: Anner Crete, MD;  Location: [facility];  Service: Urology;  Laterality: Left;  . INSERTION OF MESH N/A 07/15/2014   Procedure: INSERTION OF MESH;  Surgeon: Ardeth Sportsman, MD;  Location: [facility] OR;  Service: General;  Laterality: N/A;  . JOINT REPLACEMENT Right 2012   shoulder  . LAPAROSCOPIC CHOLECYSTECTOMY W/ CHOLANGIOGRAPHY  2012   Dr Magnus Ivan  . NASAL SINUS SURGERY    . RADIOLOGY WITH ANESTHESIA N/A 05/09/2014   Procedure: ADULT SEDATION WITH ANESTHESIA/MRI CERVICAL SPINE WITHOUT CONTRAST;  Surgeon: Medication Radiologist,  MD;  Location: [facility] OR;  Service: Radiology;  Laterality: N/A;  DR. HAWKS/MRI  . right knee arthroscopy     d/t meniscal tear  . SHOULDER ARTHROSCOPY W/ ROTATOR CUFF REPAIR Bilateral three times each over several yrs  . THUMB ARTHROSCOPY Left   . 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 UMBILICAL HERNIA REPAIR N/A 07/15/2014   Procedure: LAPAROSCOPIC UMBILICAL AND INFRAUMBILICAL HERNIA;  Surgeon: Ardeth Sportsman, MD;  Location: [facility] OR;  Service: General;  Laterality: N/A;     OB History   No obstetric history on file.     Family History  Problem Relation Age of Onset  . Stroke Father     Social History    Tobacco Use  . Smoking status: Former Smoker    Packs/day: 0.50    Years: 2.00    Pack years: 1.00    Types: Cigarettes    Quit date: 02/07/1992    Years since quitting: 29.1  . Smokeless tobacco: Never Used  Vaping Use  . Vaping Use: Never used  Substance Use Topics  . Alcohol use: No  . Drug use: No    Home Medications Prior to Admission medications   Medication Sig Start Date End Date Taking? Authorizing Provider  albuterol (PROAIR HFA) 108 (90 BASE) MCG/ACT inhaler Inhale 2 puffs into the lungs every 4 (four) hours as needed for shortness of breath.     [provider]  aspirin EC 81 MG tablet Take 81 mg by mouth daily.    [provider]  calcium-vitamin D (OSCAL WITH D) 500-200 MG-UNIT per tablet Take 1 tablet by mouth every morning.     [provider]  carboxymethylcellulose (EQ RESTORE TEARS) 0.5 % SOLN Place 1 drop into both eyes daily.    [provider]  cholecalciferol (VITAMIN D) 1000 UNITS tablet Take 1,000 Units by mouth daily.     [provider]  diclofenac sodium (VOLTAREN) 1 % GEL Apply 2 g topically 2 (two) times daily as needed (For pain.).     [provider]  Digestive Enzymes (ENZYME DIGEST PO) Take 1 tablet by mouth daily.     [provider]  diphenhydrAMINE (BENADRYL) 25 MG tablet Take 25 mg by mouth every 6 (six) hours as needed for allergies.     [provider]  ENSURE PLUS (ENSURE PLUS) LIQD Take 237 mLs by mouth daily as needed.     [provider]  ezetimibe (ZETIA) 10 MG tablet Take 10 mg by mouth daily.    [provider]  fish oil-omega-3 fatty acids 1000 MG capsule Take 1,000 mg by mouth 2 (two) times daily.     [provider]  furosemide (LASIX) 20 MG tablet Take 20 mg by mouth daily.    [provider]  HYDROcodone-acetaminophen (NORCO) 10-325 MG tablet Take 1-2 tablets by mouth every 6 (six) hours as needed for severe pain. 07/16/16    Marshia Ly, PA-C  levothyroxine (SYNTHROID, LEVOTHROID) 50 MCG tablet Take 50 mcg by mouth every morning.     [provider]  loperamide (IMODIUM A-D) 2 MG tablet Take 4 mg by mouth as needed for diarrhea or loose stools.     [provider]  losartan (COZAAR) 25 MG tablet Take 25 mg by mouth daily.     [provider]  magnesium 30 MG  tablet Take 30 mg by mouth daily.     [provider]  methocarbamol (ROBAXIN) 500 MG tablet Take 500 mg by mouth 3 (three) times daily as needed. 12/03/19   [provider]  Multiple Vitamins-Minerals (MULTIVITAMIN PO) Take 1 tablet by mouth daily.    [provider]  omeprazole (PRILOSEC) 20 MG capsule Take 20 mg by mouth daily.     [provider]  ondansetron (ZOFRAN) 8 MG tablet Take 1 tablet (8 mg total) by mouth every 8 (eight) hours as needed for nausea or vomiting. 06/25/17   Mancel Bale, MD  POTASSIUM PO Take 1 tablet by mouth daily.    [provider]  predniSONE (DELTASONE) 5 MG tablet Take 5 mg by mouth daily. 12/03/19   [provider]  Probiotic Product (PROBIOTIC DAILY PO) Take 1 capsule by mouth daily.     [provider]  prochlorperazine (COMPAZINE) 10 MG tablet Take 5 mg by mouth every 8 (eight) hours as needed (For nausea.).     [provider]  prochlorperazine (COMPAZINE) 10 MG tablet Take 1 tablet (10 mg total) by mouth 2 (two) times daily as needed for nausea or vomiting (or headache, may take with benadryl 25mg ). 04/25/20   04/27/20, MD  RESTASIS 0.05 % ophthalmic emulsion 1 drop 2 (two) times daily. 09/18/19   [provider]  triamcinolone (NASACORT) 55 MCG/ACT AERO nasal inhaler Place into the nose. 07/14/17   [provider]  Turmeric 500 MG CAPS Take 500 mg by mouth at bedtime.     [provider]  vitamin B-12 (CYANOCOBALAMIN) 250 MCG tablet Take 250 mcg by mouth at bedtime.     [provider]     Allergies    Dilaudid [hydromorphone hcl], Gabapentin, Lyrica [pregabalin], Oxycodone, Singulair [montelukast sodium], Banana, Ciprofloxacin hcl, Codeine, Methadone, Metronidazole, Oysters [shellfish allergy], Clindamycin/lincomycin, Sulfa antibiotics, Betadine [povidone iodine], Elemental sulfur, Latex, Nucynta [tapentadol], Other, and Penicillins  Review of Systems   Review of Systems  Constitutional: Negative for chills and fever.  HENT: Negative for ear pain and sore throat.   Eyes: Negative for pain and visual disturbance.  Respiratory: Negative for cough and shortness of breath.   Cardiovascular: Positive for chest pain. Negative for palpitations.  Gastrointestinal: Negative for abdominal pain and vomiting.  Genitourinary: Negative for dysuria and hematuria.  Musculoskeletal: Negative for arthralgias and back pain.  Skin: Negative for color change and rash.  Neurological: Negative for seizures and syncope.  All other systems reviewed and are negative.   Physical Exam Updated Vital Signs BP 139/71   Pulse 88   Temp (!) 97.3 F (36.3 C) (Oral)   Resp 16   Ht 5\' 2"  (1.575 m)   Wt 61.2 kg   SpO2 97%   BMI 24.69 kg/m   Physical Exam Vitals and nursing note reviewed.  Constitutional:      General: She is not in acute distress.    Appearance: She is well-developed. She is not ill-appearing.  HENT:     Head: Normocephalic and atraumatic.  Eyes:     Conjunctiva/sclera: Conjunctivae normal.     Pupils: Pupils are equal, round, and reactive to light.  Cardiovascular:     Rate and Rhythm: Normal rate and regular rhythm.     Pulses:          Radial pulses are 2+ on the right side and 2+ on the left side.     Heart sounds: Normal heart sounds. No murmur  heard.   Pulmonary:     Effort: Pulmonary effort is normal. No respiratory distress.     Breath sounds: Normal breath sounds. No decreased breath sounds, wheezing or rhonchi.  Abdominal:     Palpations: Abdomen is  soft.     Tenderness: There is no abdominal tenderness.  Musculoskeletal:     Cervical back: Normal range of motion and neck supple.     Right lower leg: No edema.     Left lower leg: No edema.  Skin:    General: Skin is warm and dry.     Capillary Refill: Capillary refill takes less than 2 seconds.  Neurological:     General: No focal deficit present.     Mental Status: She is alert.  Psychiatric:        Mood and Affect: Mood normal.     ED Results / Procedures / Treatments   Labs (all labs ordered are listed, but only abnormal results are displayed) Labs Reviewed  CBC WITH DIFFERENTIAL/PLATELET - Abnormal; Notable for the following components:      Result Value   RBC 3.63 (*)    Hemoglobin 10.7 (*)    HCT 34.2 (*)    Lymphs Abs 0.3 (*)    All other components within normal limits  BASIC METABOLIC PANEL - Abnormal; Notable for the following components:   Glucose, Bld 102 (*)    All other components within normal limits  TROPONIN I (HIGH SENSITIVITY) - Abnormal; Notable for the following components:   Troponin I (High Sensitivity) 51 (*)    All other components within normal limits  RESP PANEL BY RT-PCR (FLU A&B, COVID) [id number]  TROPONIN I (HIGH SENSITIVITY)    EKG EKG Interpretation  Date/Time:  Monday March 16 2021 16:35:28 EDT Ventricular Rate:  93 PR Interval:  98 QRS Duration: 100 QT Interval:  399 QTC Calculation: 497 R Axis:   84 Text Interpretation: Sinus rhythm Short PR interval Borderline right axis deviation Minimal ST depression, diffuse leads Borderline prolonged QT interval Confirmed by Virgina Norfolkuratolo, Lubna Stegeman ([id number]) on 03/16/2021 4:46:15 PM   Radiology DG Chest Portable 1 View  Result Date: 03/16/2021 CLINICAL DATA:  Chest pain EXAM: PORTABLE CHEST 1 VIEW COMPARISON:  04/26/2020 FINDINGS: Cardiac shadow is enlarged but stable accentuated by the portable technique. Aortic calcifications are noted. Lungs are well aerated bilaterally. Mild vascular congestion is  seen. No interstitial edema is noted. No infiltrate is seen. Postsurgical changes in the cervical spine are noted. IMPRESSION: Mild vascular congestion without other acute abnormality. Electronically Signed   By: Alcide CleverMark  Lukens M.D.   On: 03/16/2021 17:51    Procedures Procedures   Medications Ordered in ED Medications - No data to display  ED Course  I have reviewed the triage vital signs and the nursing notes.  Pertinent labs & imaging results that were available during my care of the patient were reviewed by me and considered in my medical decision making (see chart for details).    MDM Rules/Calculators/A&P                          Clayburn Pertatricia W Witt is here with chest pain.  Patient had a nerve block in her left arm that was done for surgery today and started to have some midsternal chest pain that lasted about 4 to 5 minutes.  EKG provided by EMS did show some diffuse ST depressions.  Not having any chest pain currently.  EKG now  shows sinus rhythm.  Improved from EKG done at surgery center.  Overall she appears well.  However concerning for possible cardiac event.  Opponent elevated at 51 but otherwise lab work is unremarkable.  Talked with cardiology on the phone and they recommend admission to medicine.  Given that she is chest pain-free now would not use heparin.  They will likely recommend a stress test/Myoview to be done given history.  Likely will recommend heparin if troponin continues to rise.  Will admit to medicine for further care.  Cardiology overall thinks that this was likely spasm event but believes ischemic work-up is indicated.  This chart was dictated using voice recognition software.  Despite best efforts to proofread,  errors can occur which can change the documentation meaning.    Final Clinical Impression(s) / ED Diagnoses Final diagnoses:  Chest pain, unspecified type  Elevated troponin    Rx / DC Orders ED Discharge Orders    None       Virgina Norfolk,  DO 03/16/21 [id number]

## 2021-03-16 NOTE — H&P (Addendum)
History and Physical    Isabel Barnes:[id number] DOB: 1940/12/27 DOA: 03/16/2021  PCP: Jamal Collin, PA-C  Patient coming from: Home  I have personally briefly reviewed patient's old medical records in [facility] Health Link  Chief Complaint: CP  HPI: Isabel Barnes is a 80 y.o. female with medical history significant of Mild AS, HLD, HTN, CPS.  Pt last TTE in 2020 showed normal LVEF and grade 2 DD, mild PAH, and Mild AS.  No known h/o CAD.  Pt fractured wrist in fall.  Was planning on undergoing L wrist surgery today at outpatient surgical center.  Pt had just received nerve block prior to procedure when she developed severe substernal CP, "cramping sensation".  Persisted for 5 mins before resolving.  ECG done at that time (media tab) showed diffuse STDs which have since resolved.  Procedure was canceled and pt transferred to [facility].  No CP nor SOB currently.  No fevers, chills.  Biggest complaint is that the nerve block persists and cant feel or move L arm.   ED Course: Trops of 51 and 109, remains CP free at this time.  Hosp asked to admit and cards consulted.   Review of Systems: As per HPI, otherwise all review of systems negative.  Past Medical History:  Diagnosis Date  . Anemia    iron deficiency hx.has had iron infusions before   . Chronic low back pain   . Chronic pain disorder   . Complication of anesthesia    severe claustrophobia  . Constipation    r/t use of pain meds.Takes OTC meds or eats prunes  . GERD (gastroesophageal reflux disease)    takes Omeprazole daily  . Heart murmur    "slight"  . History of bronchitis    20+ yrs ago  . History of kidney stones   . History of prolapse of bladder   . History of shingles   . Hypertension    takes Losartan daily  . Hypothyroidism    takes Synthroid daily  . Joint swelling   . Neck pain    bone spurs at base of head per pt  . Osteoarthritis    lumbar,cervical,joints  . Pneumonia    hx of > 20  yrs ago  . Shortness of breath    occasionally and with exertion. Albuterol inhaler as needed  . Spinal headache 1991   blood patch placed  . Spondylitis (HCC)   . Unsteady gait    occasionally  . Urinary urgency     Past Surgical History:  Procedure Laterality Date  . ABDOMINAL HYSTERECTOMY    . ANTERIOR FUSION CERVICAL SPINE     x2 -C4-7  . BUNIONECTOMY Bilateral   . COLONOSCOPY    . CYSTOSCOPY W/ URETEROSCOPY  2012  . EYE SURGERY Bilateral    cataract /lens implant  . HOLMIUM LASER APPLICATION Left 02/08/2013   Procedure: HOLMIUM LASER APPLICATION;  Surgeon: Anner Crete, MD;  Location[facility];  Service: Urology;  Laterality: Left;  . INSERTION OF MESH N/A 07/15/2014   Procedure: INSERTION OF MESH;  Surgeon: Ardeth Sportsman, MD;  Location: [facility] OR;  Service: General;  Laterality: N/A;  . JOINT REPLACEMENT Right 2012   shoulder  . LAPAROSCOPIC CHOLECYSTECTOMY W/ CHOLANGIOGRAPHY  2012   Dr Magnus Ivan  . NASAL SINUS SURGERY    . RADIOLOGY WITH ANESTHESIA N/A 05/09/2014   Procedure: ADULT SEDATION WITH ANESTHESIA/MRI CERVICAL SPINE WITHOUT CONTRAST;  Surgeon: Medication Radiologist, MD;  Location: [facility]  OR;  Service: Radiology;  Laterality: N/A;  DR. HAWKS/MRI  . right knee arthroscopy     d/t meniscal tear  . SHOULDER ARTHROSCOPY W/ ROTATOR CUFF REPAIR Bilateral three times each over several yrs  . THUMB ARTHROSCOPY Left   . 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 UMBILICAL HERNIA REPAIR N/A 07/15/2014   Procedure: LAPAROSCOPIC UMBILICAL AND INFRAUMBILICAL HERNIA;  Surgeon: Ardeth Sportsman, MD;  Location: [facility] OR;  Service: General;  Laterality: N/A;     reports that she quit smoking about 29 years ago. Her smoking use included cigarettes. She has a 1.00 pack-year smoking history. She has never used smokeless tobacco. She reports that  she does not drink alcohol and does not use drugs.  Allergies  Allergen Reactions  . Dilaudid [Hydromorphone Hcl] Shortness Of Breath  . Gabapentin Other (See Comments)    Hoarseness , headache and sore throat  . Latex Rash    Severe rash  . Lyrica [Pregabalin] Other (See Comments)    No balance , had to walk with cane , Blurred vision,weakness.  . Oxycodone Shortness Of Breath and Other (See Comments)    Cannot breathe.  . Singulair [Montelukast Sodium] Shortness Of Breath  . Singulair [Montelukast] Shortness Of Breath and Other (See Comments)    Vision issues, also  . Banana Other (See Comments)    Abdominal cramping   . Ciprofloxacin Hcl Nausea And Vomiting and Other (See Comments)    Nausea and vomiting with by mouth form  . Codeine Other (See Comments)    Hallucinations  . Methadone Nausea And Vomiting and Other (See Comments)    Severe nausea and vomiting  . Metronidazole Nausea And Vomiting and Other (See Comments)    Gastric pain  . Oysters [Shellfish Allergy] Other (See Comments)    "Terrible gastric upset and cramping."  . Cantaloupe (Diagnostic) Other (See Comments)    Abdominal cramping  . Ciprofloxacin Nausea And Vomiting and Other (See Comments)    Nausea and vomiting with by mouth form  . Clindamycin/Lincomycin Diarrhea and Nausea Only  . Donepezil Diarrhea and Other (See Comments)    Severe diarrhea  . Sulfa Antibiotics Diarrhea and Other (See Comments)    GI issues   . Tape Other (See Comments)    Severe rash  . Zetia [Ezetimibe] Diarrhea  . Elemental Sulfur Nausea And Vomiting  . Iodine Rash  . Other Rash    All Antibiotic ointments/ creams  . Oyster Shell Rash  . Penicillin G Nausea And Vomiting and Rash  . Penicillins Nausea And Vomiting and Rash    Has patient had a PCN reaction causing immediate rash, facial/tongue/throat swelling, SOB or lightheadedness with hypotension: Yes Has patient had a PCN reaction causing severe rash involving mucus  membranes or skin necrosis: No Has patient had a PCN reaction that required hospitalization No Has patient had a PCN reaction occurring within the last 10 years: No If all of the above answers are "NO", then may proceed with Cephalosporin use.   . Povidone Iodine Rash and Other (See Comments)    Oyster shell products- Rash   . Skintegrity Hydrogel [Skin Protectants, Misc.] Rash  . Tapentadol Other (See Comments)    Nightmares    Family History  Problem Relation Age of Onset  . Stroke Father      Prior to Admission medications  Medication Sig Start Date End Date Taking? Authorizing Provider  albuterol (VENTOLIN HFA) 108 (90 Base) MCG/ACT inhaler Inhale 2 puffs into the lungs every 4 (four) hours as needed for shortness of breath.    Yes [provider]  carboxymethylcellulose (REFRESH PLUS) 0.5 % SOLN Place 1 drop into both eyes in the morning, at noon, and at bedtime.   Yes [provider]  cholecalciferol (VITAMIN D) 1000 UNITS tablet Take 1,000 Units by mouth 2 (two) times daily.   Yes [provider]  diclofenac sodium (VOLTAREN) 1 % GEL Apply 2 g topically 2 (two) times daily as needed (for pain).   Yes [provider]  Digestive Enzymes (ENZYME DIGEST PO) Take 1 tablet by mouth daily as needed (as directed).   Yes [provider]  diphenhydrAMINE (BENADRYL) 25 MG tablet Take 50 mg by mouth in the morning, at noon, in the evening, and at bedtime.   Yes [provider]  ENSURE PLUS (ENSURE PLUS) LIQD Take 237 mLs by mouth daily as needed (for supplementation).   Yes [provider]  fish oil-omega-3 fatty acids 1000 MG capsule Take 1,000 mg by mouth 2 (two) times daily.    Yes [provider]  furosemide (LASIX) 20 MG tablet Take 20 mg by mouth daily as needed for fluid.   Yes [provider]  HYDROcodone-acetaminophen (NORCO) 10-325 MG tablet Take 1-2 tablets by mouth every 6 (six) hours as needed for  severe pain. Patient taking differently: Take 1 tablet by mouth See admin instructions. Take 1 tablet by mouth four to five times a day 07/16/16  Yes Marshia Ly, PA-C  levothyroxine (SYNTHROID, LEVOTHROID) 50 MCG tablet Take 50 mcg by mouth every morning.    Yes [provider]  loperamide (IMODIUM A-D) 2 MG tablet Take 4 mg by mouth as needed for diarrhea or loose stools.    Yes [provider]  losartan (COZAAR) 25 MG tablet Take 25 mg by mouth daily.   Yes [provider]  magnesium 30 MG tablet Take 30 mg by mouth daily.    Yes [provider]  methocarbamol (ROBAXIN) 500 MG tablet Take 500 mg by mouth 3 (three) times daily as needed for muscle spasms. 12/03/19  Yes [provider]  Multiple Vitamins-Minerals (MULTIVITAMIN PO) Take 1 tablet by mouth daily.   Yes [provider]  omeprazole (PRILOSEC) 20 MG capsule Take 20 mg by mouth See admin instructions. Take 20 mg by mouth in the morning before breakfast and an additional 20 mg during the day as needed for reflux   Yes [provider]  ondansetron (ZOFRAN) 8 MG tablet Take 1 tablet (8 mg total) by mouth every 8 (eight) hours as needed for nausea or vomiting. 06/25/17  Yes Mancel Bale, MD  POTASSIUM PO Take 1 tablet by mouth daily.   Yes [provider]  predniSONE (DELTASONE) 5 MG tablet Take 5 mg by mouth daily with breakfast. 12/03/19  Yes [provider]  Probiotic Product (PROBIOTIC DAILY PO) Take 1 capsule by mouth daily.    Yes [provider]  prochlorperazine (COMPAZINE) 10 MG tablet Take 1 tablet (10 mg total) by mouth 2 (two) times daily as needed for nausea or vomiting (or headache, may take with benadryl ). 04/25/20  Yes Alvira Monday, MD  promethazine (PHENERGAN) 12.5 MG tablet Take 12.5 mg by mouth every 6 (six) hours as needed for nausea or vomiting.   Yes [provider]  RESTASIS  0.05 % ophthalmic emulsion Place 1 drop  into both eyes daily. 09/18/19  Yes [provider]  triamcinolone (NASACORT) 55 MCG/ACT AERO nasal inhaler Place 1-2 sprays into the nose 2 (two) times daily as needed (for seasonal allergies). 07/14/17  Yes [provider]  Turmeric 500 MG CAPS Take 500 mg by mouth at bedtime.    Yes [provider]  vitamin B-12 (CYANOCOBALAMIN) 250 MCG tablet Take 250 mcg by mouth at bedtime.    Yes [provider]  aspirin EC 81 MG tablet Take 81 mg by mouth daily.    [provider]    Physical Exam: Vitals:   03/16/21 2030 03/16/21 2045 03/16/21 2130 03/16/21 2130  BP: 136/83 (!) 149/73 (!) 145/72   Pulse: 97 98 96   Resp: 15 (!) 23 (!) 21   Temp:    98.2 F (36.8 C)  TempSrc:    Oral  SpO2: 97% 97% 97%   Weight:      Height:        Constitutional: NAD, calm, comfortable Eyes: PERRL, lids and conjunctivae normal ENMT: Mucous membranes are moist. Posterior pharynx clear of any exudate or lesions.Normal dentition.  Neck: normal, supple, no masses, no thyromegaly Respiratory: clear to auscultation bilaterally, no wheezing, no crackles. Normal respiratory effort. No accessory muscle use.  Cardiovascular: Regular rate and rhythm, no murmurs / rubs / gallops. No extremity edema. 2+ pedal pulses. No carotid bruits.  Abdomen: no tenderness, no masses palpated. No hepatosplenomegaly. Bowel sounds positive.  Musculoskeletal: Echymosis and edema of L hand and wrist. Skin: no rashes, lesions, ulcers. No induration Neurologic: Cant move L arm due to nerve block still being in effect. Psychiatric: Normal judgment and insight. Alert and oriented x 3. Normal mood.    Labs on Admission: I have personally reviewed following labs and imaging studies  CBC: Recent Labs  Lab 03/16/21 1800  WBC 7.3  NEUTROABS 6.6  HGB 10.7*  HCT 34.2*  MCV 94.2  PLT 197   Basic Metabolic Panel: Recent Labs  Lab 03/16/21 1800  NA 142  K 3.8  CL 110  CO2 24  GLUCOSE  102*  BUN 12  CREATININE 0.61  CALCIUM 8.9   GFR: Estimated Creatinine Clearance: 47.5 mL/min (by C-G formula based on SCr of 0.61 mg/dL). Liver Function Tests: No results for input(s): AST, ALT, ALKPHOS, BILITOT, PROT, ALBUMIN in the last 168 hours. No results for input(s): LIPASE, AMYLASE in the last 168 hours. No results for input(s): AMMONIA in the last 168 hours. Coagulation Profile: No results for input(s): INR, PROTIME in the last 168 hours. Cardiac Enzymes: No results for input(s): CKTOTAL, CKMB, CKMBINDEX, TROPONINI in the last 168 hours. BNP (last 3 results) No results for input(s): PROBNP in the last 8760 hours. HbA1C: No results for input(s): HGBA1C in the last 72 hours. CBG: No results for input(s): GLUCAP in the last 168 hours. Lipid Profile: No results for input(s): CHOL, HDL, LDLCALC, TRIG, CHOLHDL, LDLDIRECT in the last 72 hours. Thyroid Function Tests: No results for input(s): TSH, T4TOTAL, FREET4, T3FREE, THYROIDAB in the last 72 hours. Anemia Panel: No results for input(s): VITAMINB12, FOLATE, FERRITIN, TIBC, IRON, RETICCTPCT in the last 72 hours. Urine analysis:    Component Value Date/Time   COLORURINE YELLOW 07/02/2016 1103   APPEARANCEUR CLEAR 07/02/2016 1103   LABSPEC 1.015 07/02/2016 1103   PHURINE 6.0 07/02/2016 1103   GLUCOSEU NEGATIVE 07/02/2016 1103   HGBUR TRACE (A) 07/02/2016 1103   BILIRUBINUR NEGATIVE 07/02/2016  1103   KETONESUR NEGATIVE 07/02/2016 1103   PROTEINUR NEGATIVE 07/02/2016 1103   UROBILINOGEN 0.2 02/03/2015 1758   NITRITE NEGATIVE 07/02/2016 1103   LEUKOCYTESUR NEGATIVE 07/02/2016 1103    Radiological Exams on Admission: DG Chest Portable 1 View  Result Date: 03/16/2021 CLINICAL DATA:  Chest pain EXAM: PORTABLE CHEST 1 VIEW COMPARISON:  04/26/2020 FINDINGS: Cardiac shadow is enlarged but stable accentuated by the portable technique. Aortic calcifications are noted. Lungs are well aerated bilaterally. Mild vascular congestion  is seen. No interstitial edema is noted. No infiltrate is seen. Postsurgical changes in the cervical spine are noted. IMPRESSION: Mild vascular congestion without other acute abnormality. Electronically Signed   By: Alcide Clever M.D.   On: 03/16/2021 17:51    EKG: Independently reviewed.  Assessment/Plan Principal Problem:   Chest pain, rule out acute myocardial infarction Active Problems:   Chronic pain disorder   HTN (hypertension)   Hypothyroidism   HLD (hyperlipidemia)   Mild aortic stenosis   Chronic venous insufficiency of lower extremity    1. CP r/o - 1. See cards consult note 2. CP obs pathway 3. Tele monitor 4. NPO after MN 5. Serial trops 6. Holding off on heparin for the moment 7. Cont ASA 8. 2d echo 9. Stress test in AM 10. NPO after MN 2. HTN - 1. Cont losartan 3. HLD - 1. FLP pending 2. Apparently cant tolerate zetia or statins 3. Consider repatha if appropriate 4. Hypothyroidism - cont synthroid 5. Mild AS - 1. 2d echo ordered  DVT prophylaxis: Lovenox Code Status: Full Family Communication: Family at bedside Disposition Plan: Home after CP R/O Consults called: Dr. Shari Prows Admission status: Place in obs     Tahara Ruffini M. DO [facility]  How to contact the [facility] Attending or Consulting provider 7A - 7P or covering provider during after hours 7P -7A, for this patient?  1. Check the care team in [facility] and look for a) attending/consulting [facility] provider listed and b) the [facility] team listed 2. Log into [email or web address]  [employer] Physician Scheduling and messaging for groups and whole hospitals  On call and physician scheduling software for group practices, residents, hospitalists and other medical providers for call, clinic, rotation and shift schedules. On[employer] is a hospital-wide system for scheduling doctors and paging doctors on call. EasyPlot is for scientific plotting and data analysis.  [email or web address]  and use [facility]'s universal  password to access. If you do not have the password, please contact the hospital operator.  3. Locate the [facility] provider you are looking for under [facility] and page to a number that you can be directly reached. 4. If you still have difficulty reaching the provider, please page the [facility] (Director on Call) for the Hospitalists listed on amion for assistance.  03/16/2021, 10:05 PM

## 2021-03-16 NOTE — Consult Note (Signed)
Cardiology Consultation:   [name] MRN: [id number]; DOB: Jul 11, 1941  Admit date: 03/16/2021 Date of Consult: 03/16/2021  PCP:  [facility]  Cardiologist:  No primary care provider on file.  Advanced Practice Provider:  No care team member to display Electrophysiologist:  None    Patient Profile:   Isabel Barnes is a 80 y.o. female with a hx of chronic venous insufficiency with lymphedema, mild aortic stenosis, HLD, arthritis, spondylosis and chronic pain who is being seen today for the evaluation of chest pain at the request of Dr. Lockie Mola.  History of Present Illness:   Isabel Barnes is a 80 year old female with the history detailed above who is followed by Dr. Kirke Corin who her chronic venous insufficiency. Last TTE in 2020 showed normal LV systolic function with grade 2 diastolic dysfunction, mild pulmonary hypertension and mild aortic stenosis. She has been managed conservatively for her chronic venous stasis changes. ABIs normal. She has no known history of CAD. Recent carotid ultrasounds with 1-39% ICA narrowing bilaterally.  The patient was planned to undergo left wrist surgery today after fracturing her wrist from a recent fall. Immediately after receiving the nerve block prior to the procedure, she developed severe substernal chest tightness that she describes as a "cramping sensation" in the center of her chest. Symptoms persisted for about 5 minutes before resolving. ECG done at that time (in media tab) showed diffuse STD. Given symptoms and ECG changes, her procedure was cancelled and the patient was brought to [facility] ER for further management.  In the ER, initial trop 51. ECG with NSR with interval resolution of ST changes. CXR with mild vascular congestion. Patient is currently chest pain free but states she has been having intermittent episodes of mild chest discomfor home over the past month. Symptoms were not  exertional and she could not predict when they would come on. She thought they may be related to her chronic pain. Also notes shortness of breath with activity that has been chronic. No known CAD.     Past Medical History:  Diagnosis Date  . Anemia    iron deficiency hx.has had iron infusions before   . Chronic low back pain   . Chronic pain disorder   . Complication of anesthesia    severe claustrophobia  . Constipation    r/t use of pain meds.Takes OTC meds or eats prunes  . GERD (gastroesophageal reflux disease)    takes Omeprazole daily  . Heart murmur    "slight"  . History of bronchitis    20+ yrs ago  . History of kidney stones   . History of prolapse of bladder   . History of shingles   . Hypertension    takes Losartan daily  . Hypothyroidism    takes Synthroid daily  . Joint swelling   . Neck pain    bone spurs at base of head per pt  . Osteoarthritis    lumbar,cervical,joints  . Pneumonia    hx of > 20 yrs ago  . Shortness of breath    occasionally and with exertion. Albuterol inhaler as needed  . Spinal headache 1991   blood patch placed  . Spondylitis (HCC)   . Unsteady gait    occasionally  . Urinary urgency     Past Surgical History:  Procedure Laterality Date  . ABDOMINAL HYSTERECTOMY    . ANTERIOR FUSION CERVICAL SPINE     x2 -  C4-7  . BUNIONECTOMY Bilateral   . COLONOSCOPY    . CYSTOSCOPY W/ URETEROSCOPY  2012  . EYE SURGERY Bilateral    cataract /lens implant  . HOLMIUM LASER APPLICATION Left 02/08/2013   Procedure: HOLMIUM LASER APPLICATION;  Surgeon: Anner Crete, MD;  Location: [facility];  Service: Urology;  Laterality: Left;  . INSERTION OF MESH N/A 07/15/2014   Procedure: INSERTION OF MESH;  Surgeon: Ardeth Sportsman, MD;  Location: [facility] OR;  Service: General;  Laterality: N/A;  . JOINT REPLACEMENT Right 2012   shoulder  . LAPAROSCOPIC CHOLECYSTECTOMY W/ CHOLANGIOGRAPHY  2012   Dr Magnus Ivan  . NASAL SINUS SURGERY    .  RADIOLOGY WITH ANESTHESIA N/A 05/09/2014   Procedure: ADULT SEDATION WITH ANESTHESIA/MRI CERVICAL SPINE WITHOUT CONTRAST;  Surgeon: Medication Radiologist, MD;  Location: [facility] OR;  Service: Radiology;  Laterality: N/A;  DR. HAWKS/MRI  . right knee arthroscopy     d/t meniscal tear  . SHOULDER ARTHROSCOPY W/ ROTATOR CUFF REPAIR Bilateral three times each over several yrs  . THUMB ARTHROSCOPY Left   . 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 UMBILICAL HERNIA REPAIR N/A 07/15/2014   Procedure: LAPAROSCOPIC UMBILICAL AND INFRAUMBILICAL HERNIA;  Surgeon: Ardeth Sportsman, MD;  Location: [facility] OR;  Service: General;  Laterality: N/A;     Home Medications:  Prior to Admission medications   Medication Sig Start Date End Date Taking? Authorizing Provider  albuterol (PROAIR HFA) 108 (90 BASE) MCG/ACT inhaler Inhale 2 puffs into the lungs every 4 (four) hours as needed for shortness of breath.     [provider]  aspirin EC 81 MG tablet Take 81 mg by mouth daily.    [provider]  calcium-vitamin D (OSCAL WITH D) 500-200 MG-UNIT per tablet Take 1 tablet by mouth every morning.     [provider]  carboxymethylcellulose (EQ RESTORE TEARS) 0.5 % SOLN Place 1 drop into both eyes daily.    [provider]  cholecalciferol (VITAMIN D) 1000 UNITS tablet Take 1,000 Units by mouth daily.     [provider]  diclofenac sodium (VOLTAREN) 1 % GEL Apply 2 g topically 2 (two) times daily as needed (For pain.).     [provider]  Digestive Enzymes (ENZYME DIGEST PO) Take 1 tablet by mouth daily.     [provider]  diphenhydrAMINE (BENADRYL) 25 MG tablet Take 25 mg by mouth every 6 (six) hours as needed for allergies.     [provider]  ENSURE PLUS (ENSURE PLUS) LIQD Take 237 mLs by mouth daily as needed.      [provider]  ezetimibe (ZETIA) 10 MG tablet Take 10 mg by mouth daily.    [provider]  fish oil-omega-3 fatty acids 1000 MG capsule Take 1,000 mg by mouth 2 (two) times daily.     [provider]  furosemide (LASIX) 20 MG tablet Take 20 mg by mouth daily.    [provider]  HYDROcodone-acetaminophen (NORCO) 10-325 MG tablet Take 1-2 tablets by mouth every 6 (six) hours as needed for severe pain. 07/16/16   Marshia Ly, PA-C  levothyroxine (SYNTHROID, LEVOTHROID) 50 MCG tablet Take 50 mcg by mouth every morning.     [provider]  loperamide (IMODIUM A-D) 2 MG tablet Take 4 mg by mouth as needed for diarrhea  or loose stools.     [provider]  losartan (COZAAR) 25 MG tablet Take 25 mg by mouth daily.     [provider]  magnesium 30 MG tablet Take 30 mg by mouth daily.     [provider]  methocarbamol (ROBAXIN) 500 MG tablet Take 500 mg by mouth 3 (three) times daily as needed. 12/03/19   [provider]  Multiple Vitamins-Minerals (MULTIVITAMIN PO) Take 1 tablet by mouth daily.    [provider]  omeprazole (PRILOSEC) 20 MG capsule Take 20 mg by mouth daily.     [provider]  ondansetron (ZOFRAN) 8 MG tablet Take 1 tablet (8 mg total) by mouth every 8 (eight) hours as needed for nausea or vomiting. 06/25/17   Mancel Bale, MD  POTASSIUM PO Take 1 tablet by mouth daily.    [provider]  predniSONE (DELTASONE) 5 MG tablet Take 5 mg by mouth daily. 12/03/19   [provider]  Probiotic Product (PROBIOTIC DAILY PO) Take 1 capsule by mouth daily.     [provider]  prochlorperazine (COMPAZINE) 10 MG tablet Take 5 mg by mouth every 8 (eight) hours as needed (For nausea.).     [provider]  prochlorperazine (COMPAZINE) 10 MG tablet Take 1 tablet (10 mg total) by mouth 2 (two) times daily as needed for nausea or vomiting (or headache, may take with  benadryl ). 04/25/20   Alvira Monday, MD  RESTASIS 0.05 % ophthalmic emulsion 1 drop 2 (two) times daily. 09/18/19   [provider]  triamcinolone (NASACORT) 55 MCG/ACT AERO nasal inhaler Place into the nose. 07/14/17   [provider]  Turmeric 500 MG CAPS Take 500 mg by mouth at bedtime.     [provider]  vitamin B-12 (CYANOCOBALAMIN) 250 MCG tablet Take 250 mcg by mouth at bedtime.     [provider]    Inpatient Medications: Scheduled Meds:  Continuous Infusions:  PRN Meds:   Allergies:    Allergies  Allergen Reactions  . Dilaudid [Hydromorphone Hcl] Shortness Of Breath  . Gabapentin Other (See Comments)    Hoarseness , headache and sore throat  . Latex Rash    Severe rash  . Lyrica [Pregabalin] Other (See Comments)    No balance , had to walk with cane , Blurred vision,weakness.  . Oxycodone Shortness Of Breath and Other (See Comments)    Cannot breathe.  . Singulair [Montelukast Sodium] Shortness Of Breath  . Singulair [Montelukast] Shortness Of Breath and Other (See Comments)    Vision issues, also  . Banana Other (See Comments)    Abdominal cramping   . Ciprofloxacin Hcl Nausea And Vomiting and Other (See Comments)    Nausea and vomiting with by mouth form  . Codeine Other (See Comments)    Hallucinations  . Methadone Nausea And Vomiting and Other (See Comments)    Severe nausea and vomiting  . Metronidazole Nausea And Vomiting and Other (See Comments)    Gastric pain  . Oysters [Shellfish Allergy] Other (See Comments)    "Terrible gastric upset and cramping."  . Cantaloupe (Diagnostic) Other (See Comments)    Abdominal cramping  . Ciprofloxacin Nausea And Vomiting and Other (See Comments)    Nausea and vomiting with by mouth form  . Clindamycin/Lincomycin Diarrhea and Nausea Only  . Donepezil Diarrhea and Other (See Comments)    Severe diarrhea  . Penicillin G     Other reaction(s): Unknown  .  Sulfa Antibiotics  Diarrhea and Other (See Comments)    GI issues Other reaction(s): Unknown  . Tape Other (See Comments)    Severe rash  . Zetia [Ezetimibe] Diarrhea  . Elemental Sulfur Nausea And Vomiting  . Iodine Rash  . Other Rash    All Antibiotic ointments/ creams  . Oyster Shell Rash  . Penicillins Nausea And Vomiting and Rash    Has patient had a PCN reaction causing immediate rash, facial/tongue/throat swelling, SOB or lightheadedness with hypotension: Yes Has patient had a PCN reaction causing severe rash involving mucus membranes or skin necrosis: No Has patient had a PCN reaction that required hospitalization No Has patient had a PCN reaction occurring within the last 10 years: No If all of the above answers are "NO", then may proceed with Cephalosporin use.   . Povidone Iodine Rash and Other (See Comments)    Oyster shell products- Rash   . Skintegrity Hydrogel [Skin Protectants, Misc.] Rash  . Tapentadol Other (See Comments)    Nightmares    Social History:   Social History   Socioeconomic History  . Marital status: Divorced    Spouse name: Not on file  . Number of children: Not on file  . Years of education: Not on file  . Highest education level: Not on file  Occupational History  . Not on file  Tobacco Use  . Smoking status: Former Smoker    Packs/day: 0.50    Years: 2.00    Pack years: 1.00    Types: Cigarettes    Quit date: 02/07/1992    Years since quitting: 29.1  . Smokeless tobacco: Never Used  Vaping Use  . Vaping Use: Never used  Substance and Sexual Activity  . Alcohol use: No  . Drug use: No  . Sexual activity: Not on file  Other Topics Concern  . Not on file  Social History Narrative  . Not on file   Social Determinants of Health   Financial Resource Strain: Not on file  Food Insecurity: Not on file  Transportation Needs: Not on file  Physical Activity: Not on file  Stress: Not on file  Social Connections: Not on file  Intimate Partner  Violence: Not on file    Family History:    Family History  Problem Relation Age of Onset  . Stroke Father      ROS:  Please see the history of present illness.  Review of Systems  Constitutional: Positive for malaise/fatigue. Negative for fever.  HENT: Positive for congestion. Negative for hearing loss.   Eyes: Negative for blurred vision.  Respiratory: Positive for shortness of breath.   Cardiovascular: Positive for chest pain and leg swelling. Negative for palpitations, orthopnea, claudication and PND.  Gastrointestinal: Negative for melena, nausea and vomiting.  Genitourinary: Negative for flank pain.  Musculoskeletal: Positive for falls, joint pain and myalgias.  Neurological: Negative for dizziness and loss of consciousness.  Endo/Heme/Allergies: Positive for environmental allergies. Negative for polydipsia.  Psychiatric/Behavioral: The patient is nervous/anxious.      Physical Exam/Data:   Vitals:   03/16/21 2000 03/16/21 2015 03/16/21 2030 03/16/21 2045  BP: (!) 147/66 137/80 136/83 (!) 149/73  Pulse: 83 92 97 98  Resp: 16 (!) 22 15 (!) 23  Temp:      TempSrc:      SpO2: 97% 97% 97% 97%  Weight:      Height:       No intake or output data in the 24 hours ending  03/16/21 2108 Last 3 Weights 03/16/2021 09/02/2020 01/01/2020  Weight (lbs) 135 lb 137 lb 12.8 oz 142 lb  Weight (kg) 61.236 kg 62.506 kg 64.411 kg     Body mass index is 24.69 kg/m.  General:  Chronically ill appearing, frail, thin elderly female HEENT: normal Neck: no JVD Vascular: Faint carotid bruit bilaterally  Cardiac:  RR, 3/6 early peaking systolic murmur best heard at RUSB Lungs:  clear to auscultation bilaterally, no wheezing, rhonchi or rales  Abd: soft, nontender, no hepatomegaly  Ext: Bilateral 3+ pitting lymphedema to the knees. 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 Multiple arthritic changes  Neuro:  CNs 2-12 intact, no focal  abnormalities noted Psych:  Normal affect   EKG:  The EKG was personally reviewed and demonstrates:  NSR  Telemetry:  Telemetry was personally reviewed and demonstrates:  NSR  Relevant CV Studies: TTE 04/2019: 1. The left ventricle has hyperdynamic systolic function, with an  ejection fraction of >65%. The cavity size was normal. There is moderate  concentric left ventricular hypertrophy. Left ventricular diastolic  Doppler parameters are consistent with  pseudonormalization. Elevated left atrial and left ventricular  end-diastolic pressures.  2. The right ventricle has normal systolic function. The cavity was  normal. There is no increase in right ventricular wall thickness. Right  ventricular systolic pressure is mildly elevated with an estimated  pressure of 38 mmHg.  3. Left atrial size was mildly dilated.  4. The mitral valve is degenerative. Moderate thickening of the mitral  valve leaflet. Moderate calcification of the mitral valve leaflet. There  is severe mitral annular calcification present. No evidence of mitral  valve stenosis.  5. The aortic valve is tricuspid. Mild thickening of the aortic valve.  Mild calcification of the aortic valve. Aortic valve regurgitation was not  assessed by color flow Doppler. Mild stenosis of the aortic valve.    Carotid ultrasound 09/23/20: Summary:  Right Carotid: Velocities in the right ICA are consistent with a 1-39%  stenosis.   Left Carotid: Velocities in the left ICA are consistent with a 1-39%  stenosis.   Vertebrals: Bilateral vertebral arteries demonstrate antegrade flow.  Subclavians: Normal flow hemodynamics were seen in bilateral subclavian        arteries.   LE ABI 09/23/20: Summary:  Right: Resting right ankle-brachial index is within normal range. No  evidence of significant right lower extremity arterial disease. The right  toe-brachial index is abnormal.   Left: Resting left ankle-brachial index is  within normal range. No  evidence of significant left lower extremity arterial disease. The left  toe-brachial index is normal.   Laboratory Data:  High Sensitivity Troponin:   Recent Labs  Lab 03/16/21 1800 03/16/21 1900  TROPONINIHS 51* 109*     Chemistry Recent Labs  Lab 03/16/21 1800  NA 142  K 3.8  CL 110  CO2 24  GLUCOSE 102*  BUN 12  CREATININE 0.61  CALCIUM 8.9  GFRNONAA >60  ANIONGAP 8    No results for input(s): PROT, ALBUMIN, AST, ALT, ALKPHOS, BILITOT in the last 168 hours. Hematology Recent Labs  Lab 03/16/21 1800  WBC 7.3  RBC 3.63*  HGB 10.7*  HCT 34.2*  MCV 94.2  MCH 29.5  MCHC 31.3  RDW 15.0  PLT 197   BNPNo results for input(s): BNP, PROBNP in the last 168 hours.  DDimer No results for input(s): DDIMER in the last 168 hours.   Radiology/Studies:  [facility]  Chest Portable 1 View  Result Date: 03/16/2021 CLINICAL DATA:  Chest pain EXAM: PORTABLE CHEST 1 VIEW COMPARISON:  04/26/2020 FINDINGS: Cardiac shadow is enlarged but stable accentuated by the portable technique. Aortic calcifications are noted. Lungs are well aerated bilaterally. Mild vascular congestion is seen. No interstitial edema is noted. No infiltrate is seen. Postsurgical changes in the cervical spine are noted. IMPRESSION: Mild vascular congestion without other acute abnormality. Electronically Signed   By: Alcide Clever M.D.   On: 03/16/2021 17:51     Assessment and Plan:   #Chest Pain: #Transient STD : Patient with transient, severe substernal chest pressure with associated STD after receiving nerve block for wrist procedure today. Symptoms and ECG changes resolved shortly thereafter with highest concern for possible vasospasm. Lower suspicion for ACS. Initial trop 51. Repeat pending. No known personal history of CAD and last TTE in 2020 with normal LVEF, no significant WMA, mild AS. Given risk factors, elevated troponin and dynamic ECG changes, will pursue ischemic evaluation with  myoview. -Check myoview -Check TTE -Follow-up trop -If chest pain recurs or trop significantly elevated on next check, can start heparin gtt -Continue ASA  daily -Unable to tolerate statins or zetia--can consider repatha as out-patient if needed  #Mild AS: Last TTE in 2020 with mild AS.  -Check TTE as above  #HTN: -Continue losartan  daily  #HLD: -Cannot tolerate zetia or statins -Can consider repatha as out-patient if clinically indicated -Add-on lipid panel  #Chronic Venous Insufficiency with Secondary Lymphedema: Severely edematous today with multiple blistering sites. Currently with dressings in place -Continue compression socks and leg elevation -Consult wound care -Continue lasix  daily -Follow-up with Dr. Kirke Corin as out-patient   Risk Assessment/Risk Scores:     HEAR Score (for undifferentiated chest pain):  HEAR Score: 5{     For questions or updates, please contact [facility] Please consult [email or web address] for contact info under    Signed, Meriam Sprague, MD  03/16/2021 9:08 PM

## 2021-03-16 NOTE — ED Triage Notes (Signed)
Pt bib [facility] from surgical center. Pt received a nerve block for Left arm surgery and started having mid-sternal non-radiating chest pain. Pt states pain is resolved, but she still has shortness of breath. Pt denies nausea or vomiting. EMS VSS. Pt in NAD on arrival.

## 2021-03-17 ENCOUNTER — Observation Stay (HOSPITAL_BASED_OUTPATIENT_CLINIC_OR_DEPARTMENT_OTHER): Payer: Medicare Other

## 2021-03-17 DIAGNOSIS — I350 Nonrheumatic aortic (valve) stenosis: Secondary | ICD-10-CM

## 2021-03-17 DIAGNOSIS — R079 Chest pain, unspecified: Secondary | ICD-10-CM

## 2021-03-17 DIAGNOSIS — R778 Other specified abnormalities of plasma proteins: Secondary | ICD-10-CM | POA: Diagnosis not present

## 2021-03-17 DIAGNOSIS — I872 Venous insufficiency (chronic) (peripheral): Secondary | ICD-10-CM

## 2021-03-17 DIAGNOSIS — G894 Chronic pain syndrome: Secondary | ICD-10-CM

## 2021-03-17 DIAGNOSIS — I10 Essential (primary) hypertension: Secondary | ICD-10-CM | POA: Diagnosis not present

## 2021-03-17 DIAGNOSIS — I342 Nonrheumatic mitral (valve) stenosis: Secondary | ICD-10-CM

## 2021-03-17 DIAGNOSIS — E785 Hyperlipidemia, unspecified: Secondary | ICD-10-CM | POA: Diagnosis not present

## 2021-03-17 LAB — ECHOCARDIOGRAM COMPLETE
AR max vel: 0.78 cm2
AV Area VTI: 0.81 cm2
AV Area mean vel: 0.84 cm2
AV Mean grad: 27.8 mmHg
AV Peak grad: 50.7 mmHg
Ao pk vel: 3.56 m/s
Area-P 1/2: 2.41 cm2
Height: 62 in
S' Lateral: 2.8 cm
Weight: 2141.11 oz

## 2021-03-17 LAB — NM MYOCAR MULTI W/SPECT W/WALL MOTION / EF
Estimated workload: 1 METS
Exercise duration (min): 0 min
Exercise duration (sec): 0 s
LV dias vol: 57 mL (ref 46–106)
LV sys vol: 16 mL
MPHR: 139 {beats}/min
Peak HR: 108 {beats}/min
Percent HR: 77 %
Rest HR: 84 {beats}/min
TID: 1.28

## 2021-03-17 LAB — TROPONIN I (HIGH SENSITIVITY)
Troponin I (High Sensitivity): 137 ng/L (ref <18)
Troponin I (High Sensitivity): 158 ng/L (ref <18)

## 2021-03-17 MED ORDER — ROSUVASTATIN CALCIUM 5 MG PO TABS: 10.0000 mg | ORAL_TABLET | Freq: Every day | ORAL | Status: DC

## 2021-03-17 MED ORDER — REGADENOSON 0.4 MG/5ML IV SOLN
0.4000 mg | Freq: Once | INTRAVENOUS | Status: AC
  Administered 2021-03-17: 0.4 mg via INTRAVENOUS

## 2021-03-17 MED ORDER — HYDROCODONE-ACETAMINOPHEN 5-325 MG PO TABS: 1.0000 | ORAL_TABLET | Freq: Once | ORAL | Status: AC

## 2021-03-17 MED ORDER — HYDROCODONE-ACETAMINOPHEN 5-325 MG PO TABS
ORAL_TABLET | ORAL | Status: AC
  Administered 2021-03-17: 1 via ORAL
  Filled 2021-03-17: qty 1

## 2021-03-17 MED ORDER — TECHNETIUM TC 99M TETROFOSMIN IV KIT
9.4000 | PACK | Freq: Once | INTRAVENOUS | Status: AC | PRN
  Administered 2021-03-17: 9.4 via INTRAVENOUS

## 2021-03-17 MED ORDER — ADULT MULTIVITAMIN W/MINERALS CH: 1.0000 | ORAL_TABLET | Freq: Every day | ORAL | Status: DC

## 2021-03-17 MED ORDER — ENSURE ENLIVE PO LIQD: 237.0000 mL | Freq: Two times a day (BID) | ORAL | Status: DC

## 2021-03-17 MED ORDER — TECHNETIUM TC 99M TETROFOSMIN IV KIT
30.2000 | PACK | Freq: Once | INTRAVENOUS | Status: AC | PRN
  Administered 2021-03-17: 30.2 via INTRAVENOUS

## 2021-03-17 MED ORDER — REGADENOSON 0.4 MG/5ML IV SOLN
INTRAVENOUS | Status: AC
  Filled 2021-03-17: qty 5

## 2021-03-17 MED ORDER — NITROGLYCERIN 0.4 MG SL SUBL
SUBLINGUAL_TABLET | SUBLINGUAL | Status: AC
  Filled 2021-03-17: qty 1

## 2021-03-17 MED ORDER — NITROGLYCERIN 0.4 MG SL SUBL
0.4000 mg | SUBLINGUAL_TABLET | Freq: Once | SUBLINGUAL | Status: AC
  Administered 2021-03-17: 0.4 mg via SUBLINGUAL

## 2021-03-17 NOTE — Progress Notes (Signed)
Initial Nutrition Assessment  DOCUMENTATION CODES:   Not applicable  INTERVENTION:    Ensure Enlive po BID, each supplement provides 350 kcal and 20 grams of protein  MVI with minerals daily  NUTRITION DIAGNOSIS:   Increased nutrient needs related to chronic illness as evidenced by estimated needs.  GOAL:   Patient will meet greater than or equal to 90% of their needs  MONITOR:   PO intake,Supplement acceptance,Labs,Skin  REASON FOR ASSESSMENT:   Malnutrition Screening Tool    ASSESSMENT:   80 yo female admitted from outpatient surgical center with chest pain after receiving nerve block for wrist surgery. PMH includes mild AS, HLD, HTN, CPS.   Unable to speak with patient at this time as she has been out of her hospital room for nuclear stress test.   On admission, patient reported 14-23 lb recent weight loss, but no decrease in appetite or intake. Weight history reviewed. Patient has lost 3% of usual weight since October 2021, not a significant amount. Hx of lymphedema. +2 moderate pitting edema present per RN assessment. Edema is likely masking weight loss.   Currently on a heart healthy diet.  Meal intakes: 100% breakfast today.  Labs reviewed. Cholesterol 219 CBG: 137-158  Medications reviewed and include cholecalciferol, Mag-Ox, Lovaza, prednisone.  NUTRITION - FOCUSED PHYSICAL EXAM:  unable to complete  Diet Order:   Diet Order            Diet Heart Room service appropriate? Yes; Fluid consistency: Thin  Diet effective now                 EDUCATION NEEDS:   Not appropriate for education at this time  Skin:  Skin Assessment: Skin Integrity Issues: (weeping to BLE pretibial / lymphedema blister)  Last BM:  4/18  Height:   Ht Readings from Last 1 Encounters:  03/16/21 5\' 2"  (1.575 m)    Weight:   Wt Readings from Last 1 Encounters:  03/17/21 60.7 kg    Ideal Body Weight:  50 kg  BMI:  Body mass index is 24.48 kg/m.  Estimated  Nutritional Needs:   Kcal:  1500-1700  Protein:  75-85 gm  Fluid:  1.5-1.7 L    03/19/21, RD, LDN, CNSC Please refer to Amion for contact information.

## 2021-03-17 NOTE — Progress Notes (Signed)
   Isabel Barnes presented for a nuclear stress test today.  No immediate complications.  Stress imaging is pending at this time.  Preliminary EKG findings may be listed in the chart, but the stress test result will not be finalized until perfusion imaging is complete.  Patient did receive 1 SL nitro for persistent chest pain with resolution of symptoms. Additionally give dose of hydrocodone/acetaminophen 5/325mg  x1 for chronic back pain.   [name], PA-C 03/17/2021, 1:22 PM

## 2021-03-17 NOTE — Progress Notes (Signed)
Pt continues to report chest pain 7/10. States that the pain is not better. She also reports stomach discomfort

## 2021-03-17 NOTE — Progress Notes (Signed)
Pt reports that her chest pain is better. She states that it is more of a tightness now instead of pain.

## 2021-03-17 NOTE — Progress Notes (Addendum)
Progress Note  Patient Name: Isabel Barnes Date of Encounter: 03/17/2021  [facility] Cardiologist: Dr. Fletcher Anon for PVD  Subjective   Seen in [facility]uc[facility]. No recurrent chest pain since yesterday. Denies SOB. Legs are wrapped in una boots.   Inpatient Medications    Scheduled Meds: . aspirin EC  81 mg Oral Daily  . cholecalciferol  1,000 Units Oral BID  . cycloSPORINE  1 drop Both Eyes Daily  . enoxaparin (LOVENOX) injection  40 mg Subcutaneous Q24H  . levothyroxine  50 mcg Oral q morning  . losartan  25 mg Oral Daily  . magnesium oxide  400 mg Oral Daily  . omega-3 acid ethyl esters  1,000 mg Oral BID  . pantoprazole  40 mg Oral Daily  . predniSONE  5 mg Oral Q breakfast   Continuous Infusions:  PRN Meds: acetaminophen, albuterol, diclofenac sodium, diphenhydrAMINE, HYDROcodone-acetaminophen, methocarbamol, ondansetron (ZOFRAN) IV, polyvinyl alcohol, prochlorperazine   Vital Signs    Vitals:   03/17/21 0057 03/17/21 0453 03/17/21 0803 03/17/21 0914  BP: 124/66 131/64 (!) 142/63 139/66  Pulse: 89 80 87 84  Resp: _0 Temp: 98.6 F (37 C) 97.7 F (36.5 C) 98.3 F (36.8 C)   TempSrc: Oral Oral Oral   SpO2: 97% 98% 98% 97%  Weight: 60.7 kg     Height:       No intake or output data in the 24 hours ending 03/17/21 1008 Last 3 Weights 03/17/2021 03/16/2021 03/16/2021  Weight (lbs) 133 lb 13.1 oz 133 lb 13.1 oz 135 lb  Weight (kg) 60.7 kg 60.7 kg 61.236 kg      Telemetry    Not available to review in Nuc Med - Personally Reviewed  ECG    No new tracings - Personally Reviewed  Physical Exam   GEN: No acute distress.   Neck: No JVD Cardiac: RRR, +murmur, no rubs or gallops.  Respiratory: Clear to auscultation bilaterally. GI: Soft, nontender, non-distended  MS: edematous LE's with bilateral unna boots in place; Left forearm/wrist in wrapped splint. Neuro:  Nonfocal  Psych: Normal affect   Labs    High Sensitivity Troponin:   Recent Labs  Lab  03/16/21 1800 03/16/21 1900 03/16/21 2306 03/17/21 0022  TROPONINIHS 51* 109* 137* 158*      Chemistry Recent Labs  Lab 03/16/21 1800  NA 142  K 3.8  CL 110  CO2 24  GLUCOSE 102*  BUN 12  CREATININE 0.61  CALCIUM 8.9  GFRNONAA >60  ANIONGAP 8     Hematology Recent Labs  Lab 03/16/21 1800  WBC 7.3  RBC 3.63*  HGB 10.7*  HCT 34.2*  MCV 94.2  MCH 29.5  MCHC 31.3  RDW 15.0  PLT 197    BNPNo results for input(s): BNP, PROBNP in the last 168 hours.   DDimer No results for input(s): DDIMER in the last 168 hours.   Radiology    DG Chest Portable 1 View  Result Date: 03/16/2021 CLINICAL DATA:  Chest pain EXAM: PORTABLE CHEST 1 VIEW COMPARISON:  04/26/2020 FINDINGS: Cardiac shadow is enlarged but stable accentuated by the portable technique. Aortic calcifications are noted. Lungs are well aerated bilaterally. Mild vascular congestion is seen. No interstitial edema is noted. No infiltrate is seen. Postsurgical changes in the cervical spine are noted. IMPRESSION: Mild vascular congestion without other acute abnormality. Electronically Signed   By: Inez Catalina M.D.   On: 03/16/2021 17:51    Cardiac Studies   Echocardiogram  03/17/21: 1. Left ventricular ejection fraction, by estimation, is 65 to 70%. The  left ventricle has normal function. The left ventricle has no regional  wall motion abnormalities. There is mild concentric left ventricular  hypertrophy. Left ventricular diastolic  parameters are consistent with Grade II diastolic dysfunction  (pseudonormalization).  2. Right ventricular systolic function is normal. The right ventricular  size is normal. There is moderately elevated pulmonary artery systolic  pressure. The estimated right ventricular systolic pressure is 08.0 mmHg.  3. Left atrial size was severely dilated.  4. The mitral valve is normal in structure. Trivial mitral valve  regurgitation. Mild mitral stenosis. The mean mitral valve gradient is  5.4  mmHg with average heart rate of 83 bpm. Moderate to severe mitral annular  calcification.  5. The aortic valve is tricuspid. There is moderate calcification of the  aortic valve. There is moderate thickening of the aortic valve. Aortic  valve regurgitation is not visualized. Moderate to severe aortic valve  stenosis. Aortic valve area, by VTI  measures 0.81 cm. Aortic valve mean gradient measures 27.8 mmHg. Aortic  valve Vmax measures 3.56 m/s. Aortic valve acceleration time measures 119  msec.  6. The inferior vena cava is dilated in size with <50% respiratory  variability, suggesting right atrial pressure of 15 mmHg.   Comparison(s): Prior images reviewed side by side. Aortic stenosis has  worsened.   Patient Profile     80 y.o. female with a PMH of aortic stenosis, HTN, HLD, chronic venous insufficiency with lymphedema, arthritis, and spondylosis with chronic pain, who is being followed by cardiology for the evaluation of chest pain.  Assessment & Plan    1. Chest pain in patient with no known CAD: patient presented from the outpatient surgical center for the evaluation of chest pain. She was to undergo wrist surgery 03/16/21, however after having nerve block to arm she developed chest pain and was transferred to [facility] ED. HsTrop 51>109>137>158. EKG initially with STD, however this resolved on subsequent EKGs. Seen by Dr. Johney Frame yesterday evening who suspected possible vasospasm. Echo showed EF 65/70%, no RWMA, mild concentric LVH, G2DD, severe LAE, mild MS with moderate to severe MAC, and moderate to severe aortic stenosis. Decision made to pursue a NST to r/o ischemia - Seen in Nuc med today - will await results to determine if further testing is needed this admission - Continue aspirin - Would start statin given LDL 131 this admission  2. Aortic stenosis: echo this admission shows progression of AS from mild 04/2019 to moderate-severe range. Possible this contributed to #1. May  be a good candidate for TAVR down the road.  - Consider referral to structural heart team if CP persists for possible TAVR evaluation  3. HLD: LDL 131 this admission. On omega 3 at home. Has prior intolerance to statins and zetia.  - Consider referral to lipid clinic for PCSK9-inhibitor   4. HTN: BP overall stable - Continue losartan  5. Chronic venous insufficiency with secondary lymphedema: unna boots placed this admission.  - Continue management per ortho and primary team  6. Left distal radius fracture: was to have surgical repair 03/16/21, however developed chest pain. Now with plans to repair 03/18/21 if NST negative.  - Continue management per ortho.        For questions or updates, please contact [place] Please consult [email or web address] for contact info under        Signed, Abigail Butts, PA-C  03/17/2021, 10:08 AM  Patient seen and examined with Roby Lofts PA-C.  Agree as above, with the following exceptions and changes as noted below. Feeling well after stress, no concerns. Plans for discharge home today. Gen: NAD, CV: RRR, 3/6 SEM RUSB and 2/6 HDM at apex, Lungs: clear, Abd: soft, Extrem: Warm, well perfused, no edema, left wrist splinted, Neuro/Psych: alert and oriented x 3, normal mood and affect. All available labs, radiology testing, previous records reviewed. She has a low risk nuclear stress test and no recurrent chest pain. TID is elevated but reading physician suggests this is overall low risk picture.   Will need follow up with Dr. Fletcher Anon for aortic valve stenosis which is now likely moderate to severe.  Elouise Munroe, MD 03/17/21 5:06 PM

## 2021-03-17 NOTE — Plan of Care (Signed)

## 2021-03-17 NOTE — Discharge Summary (Signed)
Physician Discharge Summary  Isabel Barnes MHD:[id number] DOB: Dec 21, 1940 DOA: 03/16/2021  PCP: Jamal Collin, PA-C  Admit date: 03/16/2021 Discharge date: 03/17/2021  Admitted From: home  Disposition:  home   Recommendations for Outpatient Follow-up:  1. F/u for further chest pain   Discharge Condition:  stable   CODE STATUS:  Full code   Diet recommendation:  Heart healthy Consultations:  cardiology  Procedures/Studies: . Stress test   Discharge Diagnoses:  Principal Problem:   Chest pain, rule out acute myocardial infarction Active Problems:   Chronic pain disorder   HTN (hypertension)   Hypothyroidism   HLD (hyperlipidemia)   Mild aortic stenosis   Chronic venous insufficiency of lower extremity   left wrist fracture     Brief Summary: Isabel Barnes is a 80 y.o. female with medical history significant of Mild AS, HLD, HTN, CPS.Pt last TTE in 2020 showed normal LVEF and grade 2 DD, mild PAH, and Mild AS.  No known h/o CAD. She presents to the hospital for chest pain after receiving the nerve block for left wrist surgery which was present in the center of her chest. EKG at the time revealed diffuse ST depression.  In ED > initial troponin 51.  Hospital Course:  Chest pain with transient ST depressions - She tells me her chest pain has not recurred in the hospital -  Stress test was low risk- OK to  d/c home today per cardiology     Discharge Exam: Vitals:   03/17/21 1313 03/17/21 1434  BP: 133/67 128/70  Pulse: (!) 106 96  Resp:  18  Temp:  98.6 F (37 C)  SpO2:  100%   Vitals:   03/17/21 1309 03/17/21 1312 03/17/21 1313 03/17/21 1434  BP: (!) 142/67 132/69 133/67 128/70  Pulse: (!) 104 (!) 103 (!) 106 96  Resp:    18  Temp:    98.6 F (37 C)  TempSrc:    Oral  SpO2:    100%  Weight:      Height:        General: Pt is alert, awake, not in acute distress Cardiovascular: RRR, S1/S2 +, no rubs, no gallops Respiratory: CTA bilaterally,  no wheezing, no rhonchi Abdominal: Soft, NT, ND, bowel sounds + Extremities: no edema, no cyanosis   Discharge Instructions  Discharge Instructions    Diet - low sodium heart healthy   Complete by: As directed    Increase activity slowly   Complete by: As directed    No wound care   Complete by: As directed      Allergies as of 03/17/2021      Reactions   Dilaudid [hydromorphone Hcl] Shortness Of Breath   Gabapentin Other (See Comments)   Hoarseness , headache and sore throat   Latex Rash   Severe rash   Lyrica [pregabalin] Other (See Comments)   No balance , had to walk with cane , Blurred vision,weakness.   Oxycodone Shortness Of Breath, Other (See Comments)   Cannot breathe.   Singulair [montelukast Sodium] Shortness Of Breath   Singulair [montelukast] Shortness Of Breath, Other (See Comments)   Vision issues, also   Banana Other (See Comments)   Abdominal cramping   Ciprofloxacin Hcl Nausea And Vomiting, Other (See Comments)   Nausea and vomiting with by mouth form   Codeine Other (See Comments)   Hallucinations   Methadone Nausea And Vomiting, Other (See Comments)   Severe nausea and vomiting   Metronidazole Nausea And Vomiting,  Other (See Comments)   Gastric pain   Oysters [shellfish Allergy] Other (See Comments)   "Terrible gastric upset and cramping."   Cantaloupe (diagnostic) Other (See Comments)   Abdominal cramping   Ciprofloxacin Nausea And Vomiting, Other (See Comments)   Nausea and vomiting with by mouth form   Clindamycin/lincomycin Diarrhea, Nausea Only   Donepezil Diarrhea, Other (See Comments)   Severe diarrhea   Sulfa Antibiotics Diarrhea, Other (See Comments)   GI issues   Tape Other (See Comments)   Severe rash   Zetia [ezetimibe] Diarrhea   Elemental Sulfur Nausea And Vomiting   Iodine Rash   Other Rash   All Antibiotic ointments/ creams   Oyster Shell Rash   Penicillin G Nausea And Vomiting, Rash   Penicillins Nausea And Vomiting, Rash    Has patient had a PCN reaction causing immediate rash, facial/tongue/throat swelling, SOB or lightheadedness with hypotension: Yes Has patient had a PCN reaction causing severe rash involving mucus membranes or skin necrosis: No Has patient had a PCN reaction that required hospitalization No Has patient had a PCN reaction occurring within the last 10 years: No If all of the above answers are "NO", then may proceed with Cephalosporin use.   Povidone Iodine Rash, Other (See Comments)   Oyster shell products- Rash   Skintegrity Hydrogel [skin Protectants, Misc.] Rash   Tapentadol Other (See Comments)   Nightmares      Medication List    TAKE these medications   albuterol 108 (90 Base) MCG/ACT inhaler Commonly known as: VENTOLIN HFA Inhale 2 puffs into the lungs every 4 (four) hours as needed for shortness of breath.   aspirin EC 81 MG tablet Take 81 mg by mouth daily.   carboxymethylcellulose 0.5 % Soln Commonly known as: REFRESH PLUS Place 1 drop into both eyes in the morning, at noon, and at bedtime.   cholecalciferol 1000 units tablet Commonly known as: VITAMIN D Take 1,000 Units by mouth 2 (two) times daily.   diclofenac sodium 1 % Gel Commonly known as: VOLTAREN Apply 2 g topically 2 (two) times daily as needed (for pain).   diphenhydrAMINE 25 MG tablet Commonly known as: BENADRYL Take 50 mg by mouth in the morning, at noon, in the evening, and at bedtime.   Ensure Plus Liqd Take 237 mLs by mouth daily as needed (for supplementation).   ENZYME DIGEST PO Take 1 tablet by mouth daily as needed (as directed).   fish oil-omega-3 fatty acids 1000 MG capsule Take 1,000 mg by mouth 2 (two) times daily.   furosemide 20 MG tablet Commonly known as: LASIX Take 20 mg by mouth daily as needed for fluid.   HYDROcodone-acetaminophen 10-325 MG tablet Commonly known as: NORCO Take 1-2 tablets by mouth every 6 (six) hours as needed for severe pain. What changed:   how much  to take  when to take this  additional instructions   levothyroxine 50 MCG tablet Commonly known as: SYNTHROID Take 50 mcg by mouth every morning.   loperamide 2 MG tablet Commonly known as: IMODIUM A-D Take 4 mg by mouth as needed for diarrhea or loose stools.   losartan 25 MG tablet Commonly known as: COZAAR Take 25 mg by mouth daily.   magnesium 30 MG tablet Take 30 mg by mouth daily.   methocarbamol 500 MG tablet Commonly known as: ROBAXIN Take 500 mg by mouth 3 (three) times daily as needed for muscle spasms.   MULTIVITAMIN PO Take 1 tablet by mouth daily.  omeprazole 20 MG capsule Commonly known as: PRILOSEC Take 20 mg by mouth See admin instructions. Take 20 mg by mouth in the morning before breakfast and an additional 20 mg during the day as needed for reflux   ondansetron 8 MG tablet Commonly known as: Zofran Take 1 tablet (8 mg total) by mouth every 8 (eight) hours as needed for nausea or vomiting.   POTASSIUM PO Take 1 tablet by mouth daily.   predniSONE 5 MG tablet Commonly known as: DELTASONE Take 5 mg by mouth daily with breakfast.   PROBIOTIC DAILY PO Take 1 capsule by mouth daily.   prochlorperazine 10 MG tablet Commonly known as: COMPAZINE Take 1 tablet (10 mg total) by mouth 2 (two) times daily as needed for nausea or vomiting (or headache, may take with benadryl ).   promethazine 12.5 MG tablet Commonly known as: PHENERGAN Take 12.5 mg by mouth every 6 (six) hours as needed for nausea or vomiting.   Restasis 0.05 % ophthalmic emulsion Generic drug: cycloSPORINE Place 1 drop into both eyes daily.   triamcinolone 55 MCG/ACT Aero nasal inhaler Commonly known as: NASACORT Place 1-2 sprays into the nose 2 (two) times daily as needed (for seasonal allergies).   Turmeric 500 MG Caps Take 500 mg by mouth at bedtime.   vitamin B-12 250 MCG tablet Commonly known as: CYANOCOBALAMIN Take 250 mcg by mouth at bedtime.       Allergies   Allergen Reactions  . Dilaudid [Hydromorphone Hcl] Shortness Of Breath  . Gabapentin Other (See Comments)    Hoarseness , headache and sore throat  . Latex Rash    Severe rash  . Lyrica [Pregabalin] Other (See Comments)    No balance , had to walk with cane , Blurred vision,weakness.  . Oxycodone Shortness Of Breath and Other (See Comments)    Cannot breathe.  . Singulair [Montelukast Sodium] Shortness Of Breath  . Singulair [Montelukast] Shortness Of Breath and Other (See Comments)    Vision issues, also  . Banana Other (See Comments)    Abdominal cramping   . Ciprofloxacin Hcl Nausea And Vomiting and Other (See Comments)    Nausea and vomiting with by mouth form  . Codeine Other (See Comments)    Hallucinations  . Methadone Nausea And Vomiting and Other (See Comments)    Severe nausea and vomiting  . Metronidazole Nausea And Vomiting and Other (See Comments)    Gastric pain  . Oysters [Shellfish Allergy] Other (See Comments)    "Terrible gastric upset and cramping."  . Cantaloupe (Diagnostic) Other (See Comments)    Abdominal cramping  . Ciprofloxacin Nausea And Vomiting and Other (See Comments)    Nausea and vomiting with by mouth form  . Clindamycin/Lincomycin Diarrhea and Nausea Only  . Donepezil Diarrhea and Other (See Comments)    Severe diarrhea  . Sulfa Antibiotics Diarrhea and Other (See Comments)    GI issues   . Tape Other (See Comments)    Severe rash  . Zetia [Ezetimibe] Diarrhea  . Elemental Sulfur Nausea And Vomiting  . Iodine Rash  . Other Rash    All Antibiotic ointments/ creams  . Oyster Shell Rash  . Penicillin G Nausea And Vomiting and Rash  . Penicillins Nausea And Vomiting and Rash    Has patient had a PCN reaction causing immediate rash, facial/tongue/throat swelling, SOB or lightheadedness with hypotension: Yes Has patient had a PCN reaction causing severe rash involving mucus membranes or skin necrosis: No Has patient  had a PCN reaction that  required hospitalization No Has patient had a PCN reaction occurring within the last 10 years: No If all of the above answers are "NO", then may proceed with Cephalosporin use.   . Povidone Iodine Rash and Other (See Comments)    Oyster shell products- Rash   . Skintegrity Hydrogel [Skin Protectants, Misc.] Rash  . Tapentadol Other (See Comments)    Nightmares      NM Myocar Multi W/Spect W/Wall Motion / EF  Result Date: 03/17/2021  ST segment depression was noted during stress in the II, III, aVF, V4 and V5 leads.  No T wave inversion was noted during stress.  This is a low risk study.  The left ventricular ejection fraction is hyperdynamic (>65%).  IMPRESSIONS PACs through study. 1 mm horizontal ST depressions with stress as noted above. Hyperdynamic LVEF No perfusion defects There is small subdiaphragmatic activity seen in the field during rest and stress; no evidence of patient movement. TID of 1.28 reported but without qualitative dilation and with LVEDVi of 36 ml/m2. RECOMMENDATIONS/CONCLUSIONS Negative stress test for ischemia or infarction.  TID 1.28 as above. The study is consistent with a low risk study.   DG Chest Portable 1 View  Result Date: 03/16/2021 CLINICAL DATA:  Chest pain EXAM: PORTABLE CHEST 1 VIEW COMPARISON:  04/26/2020 FINDINGS: Cardiac shadow is enlarged but stable accentuated by the portable technique. Aortic calcifications are noted. Lungs are well aerated bilaterally. Mild vascular congestion is seen. No interstitial edema is noted. No infiltrate is seen. Postsurgical changes in the cervical spine are noted. IMPRESSION: Mild vascular congestion without other acute abnormality. Electronically Signed   By: Alcide Clever M.D.   On: 03/16/2021 17:51   ECHOCARDIOGRAM COMPLETE  Result Date: 03/17/2021    ECHOCARDIOGRAM REPORT   Patient Name:   Isabel Barnes Date of Exam: 03/17/2021 Medical Rec #:  [id number]         Height:       62.0 in Accession #:    [id number]         Weight:       133.8 lb Date of Birth:  June 11, 1941          BSA:          1.612 m Patient Age:    79 years          BP:           139/66 mmHg Patient Gender: F                 HR:           87 bpm. Exam Location:  Inpatient Procedure: 2D Echo, Cardiac Doppler and Color Doppler Indications:    Pre-op echo  History:        Patient has prior history of Echocardiogram examinations, most                 recent 05/01/2019. Arrythmias:arrythmia; Signs/Symptoms:Murmur.                 Frequent fall, broken arm.  Sonographer:    Roosvelt Maser RDCS Referring Phys: [phone] JARED M GARDNER IMPRESSIONS  1. Left ventricular ejection fraction, by estimation, is 65 to 70%. The left ventricle has normal function. The left ventricle has no regional wall motion abnormalities. There is mild concentric left ventricular hypertrophy. Left ventricular diastolic parameters are consistent with Grade II diastolic dysfunction (pseudonormalization).  2. Right ventricular systolic function is normal. The right ventricular size  is normal. There is moderately elevated pulmonary artery systolic pressure. The estimated right ventricular systolic pressure is 49.3 mmHg.  3. Left atrial size was severely dilated.  4. The mitral valve is normal in structure. Trivial mitral valve regurgitation. Mild mitral stenosis. The mean mitral valve gradient is 5.4 mmHg with average heart rate of 83 bpm. Moderate to severe mitral annular calcification.  5. The aortic valve is tricuspid. There is moderate calcification of the aortic valve. There is moderate thickening of the aortic valve. Aortic valve regurgitation is not visualized. Moderate to severe aortic valve stenosis. Aortic valve area, by VTI measures 0.81 cm. Aortic valve mean gradient measures 27.8 mmHg. Aortic valve Vmax measures 3.56 m/s. Aortic valve acceleration time measures 119 msec.  6. The inferior vena cava is dilated in size with <50% respiratory variability, suggesting right atrial pressure of 15  mmHg. Comparison(s): Prior images reviewed side by side. Aortic stenosis has worsened. FINDINGS  Left Ventricle: Left ventricular ejection fraction, by estimation, is 65 to 70%. The left ventricle has normal function. The left ventricle has no regional wall motion abnormalities. The left ventricular internal cavity size was normal in size. There is  mild concentric left ventricular hypertrophy. Left ventricular diastolic parameters are consistent with Grade II diastolic dysfunction (pseudonormalization). Right Ventricle: The right ventricular size is normal. No increase in right ventricular wall thickness. Right ventricular systolic function is normal. There is moderately elevated pulmonary artery systolic pressure. The tricuspid regurgitant velocity is 2.93 m/s, and with an assumed right atrial pressure of 15 mmHg, the estimated right ventricular systolic pressure is 49.3 mmHg. Left Atrium: Left atrial size was severely dilated. Right Atrium: Right atrial size was normal in size. Pericardium: There is no evidence of pericardial effusion. Mitral Valve: The mitral valve is normal in structure. Moderate to severe mitral annular calcification. Trivial mitral valve regurgitation. Mild mitral valve stenosis. The mean mitral valve gradient is 5.4 mmHg with average heart rate of 83 bpm. Tricuspid Valve: The tricuspid valve is normal in structure. Tricuspid valve regurgitation is not demonstrated. No evidence of tricuspid stenosis. Aortic Valve: The aortic valve is tricuspid. There is moderate calcification of the aortic valve. There is moderate thickening of the aortic valve. Aortic valve regurgitation is not visualized. Moderate to severe aortic stenosis is present. Aortic valve mean gradient measures 27.8 mmHg. Aortic valve peak gradient measures 50.7 mmHg. Aortic valve area, by VTI measures 0.81 cm. Pulmonic Valve: The pulmonic valve was grossly normal. Pulmonic valve regurgitation is not visualized. No evidence of  pulmonic stenosis. Aorta: The aortic root is normal in size and structure. Venous: The inferior vena cava is dilated in size with less than 50% respiratory variability, suggesting right atrial pressure of 15 mmHg. IAS/Shunts: No atrial level shunt detected by color flow Doppler.  LEFT VENTRICLE PLAX 2D LVIDd:         4.30 cm  Diastology LVIDs:         2.80 cm  LV e' medial:    5.22 cm/s LV PW:         1.20 cm  LV E/e' medial:  28.9 LV IVS:        1.40 cm  LV e' lateral:   6.74 cm/s LVOT diam:     1.92 cm  LV E/e' lateral: 22.4 LV SV:         70 LV SV Index:   43 LVOT Area:     2.90 cm  RIGHT VENTRICLE  IVC RV Basal diam:  3.80 cm  IVC diam: 2.30 cm LEFT ATRIUM             Index       RIGHT ATRIUM           Index LA diam:        4.40 cm 2.73 cm/m  RA Area:     18.10 cm LA Vol (A2C):   80.2 ml 49.77 ml/m RA Volume:   43.70 ml  27.12 ml/m LA Vol (A4C):   79.7 ml 49.45 ml/m LA Biplane Vol: 84.7 ml 52.56 ml/m  AORTIC VALVE AV Area (Vmax):    0.78 cm AV Area (Vmean):   0.84 cm AV Area (VTI):     0.81 cm AV Vmax:           356.06 cm/s AV Vmean:          245.644 cm/s AV VTI:            0.863 m AV Peak Grad:      50.7 mmHg AV Mean Grad:      27.8 mmHg LVOT Vmax:         95.49 cm/s LVOT Vmean:        71.260 cm/s LVOT VTI:          0.241 m LVOT/AV VTI ratio: 0.28  AORTA Ao Root diam: 2.80 cm Ao Asc diam:  2.30 cm MITRAL VALVE                TRICUSPID VALVE MV Area (PHT): 2.41 cm     TR Peak grad:   34.3 mmHg MV Mean grad:  5.4 mmHg     TR Vmax:        293.00 cm/s MV Decel Time: 315 msec MV E velocity: 151.00 cm/s  SHUNTS MV A velocity: 133.00 cm/s  Systemic VTI:  0.24 m MV E/A ratio:  1.14         Systemic Diam: 1.92 cm Rachelle Hora Croitoru MD Electronically signed by Thurmon Fair MD Signature Date/Time: 03/17/2021/10:34:01 AM    Final      The results of significant diagnostics from this hospitalization (including imaging, microbiology, ancillary and laboratory) are listed below for reference.      Microbiology: Recent Results (from the past 240 hour(s))  Resp Panel by RT-PCR (Flu A&B, Covid) Nasopharyngeal Swab     Status: None   Collection Time: 03/16/21  8:45 PM   Specimen: Nasopharyngeal Swab; Nasopharyngeal(NP) swabs in vial transport medium  Result Value Ref Range Status   SARS Coronavirus 2 by RT PCR NEGATIVE NEGATIVE Final    Comment: (NOTE) SARS-CoV-2 target nucleic acids are NOT DETECTED.  The SARS-CoV-2 RNA is generally detectable in upper respiratory specimens during the acute phase of infection. The lowest concentration of SARS-CoV-2 viral copies this assay can detect is 138 copies/mL. A negative result does not preclude SARS-Cov-2 infection and should not be used as the sole basis for treatment or other patient management decisions. A negative result may occur with  improper specimen collection/handling, submission of specimen other than nasopharyngeal swab, presence of viral mutation(s) within the areas targeted by this assay, and inadequate number of viral copies(<138 copies/mL). A negative result must be combined with clinical observations, patient history, and epidemiological information. The expected result is Negative.  Fact Sheet for Patients:  BloggerCourse.com  Fact Sheet for Healthcare Providers:  SeriousBroker.it  This test is no t yet approved or cleared by the [place] FDA and  has  been authorized for detection and/or diagnosis of SARS-CoV-2 by FDA under an Emergency Use Authorization (EUA). This EUA will remain  in effect (meaning this test can be used) for the duration of the COVID-19 declaration under Section 564(b)(1) of the Act, 21 U.S.C.section 360bbb-3(b)(1), unless the authorization is terminated  or revoked sooner.       Influenza A by PCR NEGATIVE NEGATIVE Final   Influenza B by PCR NEGATIVE NEGATIVE Final    Comment: (NOTE) The Xpert Xpress SARS-CoV-2/FLU/RSV plus assay is  intended as an aid in the diagnosis of influenza from Nasopharyngeal swab specimens and should not be used as a sole basis for treatment. Nasal washings and aspirates are unacceptable for Xpert Xpress SARS-CoV-2/FLU/RSV testing.  Fact Sheet for Patients: BloggerCourse.com  Fact Sheet for Healthcare Providers: SeriousBroker.it  This test is not yet approved or cleared by the [place] FDA and has been authorized for detection and/or diagnosis of SARS-CoV-2 by FDA under an Emergency Use Authorization (EUA). This EUA will remain in effect (meaning this test can be used) for the duration of the COVID-19 declaration under Section 564(b)(1) of the Act, 21 U.S.C. section 360bbb-3(b)(1), unless the authorization is terminated or revoked.  Performed at [facility], [address] [place], [place] [address]      Labs: BNP (last 3 results) No results for input(s): BNP in the last 8760 hours. Basic Metabolic Panel: Recent Labs  Lab 03/16/21 1800  NA 142  K 3.8  CL 110  CO2 24  GLUCOSE 102*  BUN 12  CREATININE 0.61  CALCIUM 8.9   Liver Function Tests: No results for input(s): AST, ALT, ALKPHOS, BILITOT, PROT, ALBUMIN in the last 168 hours. No results for input(s): LIPASE, AMYLASE in the last 168 hours. No results for input(s): AMMONIA in the last 168 hours. CBC: Recent Labs  Lab 03/16/21 1800  WBC 7.3  NEUTROABS 6.6  HGB 10.7*  HCT 34.2*  MCV 94.2  PLT 197   Cardiac Enzymes: No results for input(s): CKTOTAL, CKMB, CKMBINDEX, TROPONINI in the last 168 hours. BNP: Invalid input(s): POCBNP CBG: No results for input(s): GLUCAP in the last 168 hours. D-Dimer No results for input(s): DDIMER in the last 72 hours. Hgb A1c No results for input(s): HGBA1C in the last 72 hours. Lipid Profile Recent Labs    03/16/21 2306  CHOL 219*  HDL 79  LDLCALC 130*  TRIG 48  CHOLHDL 2.8   Thyroid function studies No  results for input(s): TSH, T4TOTAL, T3FREE, THYROIDAB in the last 72 hours.  Invalid input(s): FREET3 Anemia work up No results for input(s): VITAMINB12, FOLATE, FERRITIN, TIBC, IRON, RETICCTPCT in the last 72 hours. Urinalysis    Component Value Date/Time   COLORURINE YELLOW 07/02/2016 1103   APPEARANCEUR CLEAR 07/02/2016 1103   LABSPEC 1.015 07/02/2016 1103   PHURINE 6.0 07/02/2016 1103   GLUCOSEU NEGATIVE 07/02/2016 1103   HGBUR TRACE (A) 07/02/2016 1103   BILIRUBINUR NEGATIVE 07/02/2016 1103   KETONESUR NEGATIVE 07/02/2016 1103   PROTEINUR NEGATIVE 07/02/2016 1103   UROBILINOGEN 0.2 02/03/2015 1758   NITRITE NEGATIVE 07/02/2016 1103   LEUKOCYTESUR NEGATIVE 07/02/2016 1103   Sepsis Labs Invalid input(s): PROCALCITONIN,  WBC,  LACTICIDVEN Microbiology Recent Results (from the past 240 hour(s))  Resp Panel by RT-PCR (Flu A&B, Covid) Nasopharyngeal Swab     Status: None   Collection Time: 03/16/21  8:45 PM   Specimen: Nasopharyngeal Swab; Nasopharyngeal(NP) swabs in vial transport medium  Result Value Ref Range Status  SARS Coronavirus 2 by RT PCR NEGATIVE NEGATIVE Final    Comment: (NOTE) SARS-CoV-2 target nucleic acids are NOT DETECTED.  The SARS-CoV-2 RNA is generally detectable in upper respiratory specimens during the acute phase of infection. The lowest concentration of SARS-CoV-2 viral copies this assay can detect is 138 copies/mL. A negative result does not preclude SARS-Cov-2 infection and should not be used as the sole basis for treatment or other patient management decisions. A negative result may occur with  improper specimen collection/handling, submission of specimen other than nasopharyngeal swab, presence of viral mutation(s) within the areas targeted by this assay, and inadequate number of viral copies(<138 copies/mL). A negative result must be combined with clinical observations, patient history, and epidemiological information. The expected result is  Negative.  Fact Sheet for Patients:  BloggerCourse.com  Fact Sheet for Healthcare Providers:  SeriousBroker.it  This test is no t yet approved or cleared by the [place] FDA and  has been authorized for detection and/or diagnosis of SARS-CoV-2 by FDA under an Emergency Use Authorization (EUA). This EUA will remain  in effect (meaning this test can be used) for the duration of the COVID-19 declaration under Section 564(b)(1) of the Act, 21 U.S.C.section 360bbb-3(b)(1), unless the authorization is terminated  or revoked sooner.       Influenza A by PCR NEGATIVE NEGATIVE Final   Influenza B by PCR NEGATIVE NEGATIVE Final    Comment: (NOTE) The Xpert Xpress SARS-CoV-2/FLU/RSV plus assay is intended as an aid in the diagnosis of influenza from Nasopharyngeal swab specimens and should not be used as a sole basis for treatment. Nasal washings and aspirates are unacceptable for Xpert Xpress SARS-CoV-2/FLU/RSV testing.  Fact Sheet for Patients: BloggerCourse.com  Fact Sheet for Healthcare Providers: SeriousBroker.it  This test is not yet approved or cleared by the [place] FDA and has been authorized for detection and/or diagnosis of SARS-CoV-2 by FDA under an Emergency Use Authorization (EUA). This EUA will remain in effect (meaning this test can be used) for the duration of the COVID-19 declaration under Section 564(b)(1) of the Act, 21 U.S.C. section 360bbb-3(b)(1), unless the authorization is terminated or revoked.  Performed at [facility], [address], [place], [place] [address]      Time coordinating discharge in minutes: 45  SIGNED:   Calvert Cantor, MD  [facility] 03/17/2021, 5:29 PM

## 2021-03-17 NOTE — Progress Notes (Signed)
  Echocardiogram 2D Echocardiogram has been performed.  Isabel Barnes F 03/17/2021, 9:25 AM

## 2021-03-17 NOTE — Progress Notes (Signed)
Pt reports shortness of breath and chest pain during nuclear med chemical stress test

## 2021-03-17 NOTE — Progress Notes (Signed)
Heart Failure Navigator Progress Note  Assessed for Heart & Vascular TOC clinic readiness.  Unfortunately at this time the patient does not meet criteria due to severe aortic stenosis noted on current ECHO.   Navigator available for reassessment of patient.   Ozella Rocks, RN, BSN Heart Failure Nurse Navigator [phone]

## 2021-03-17 NOTE — Consult Note (Signed)
ORTHOPAEDIC CONSULTATION  REQUESTING PHYSICIAN: Debbe Odea, MD  Chief Complaint: left wrist pain  HPI: Isabel Barnes is a 80 y.o. female with iron deficiency anemia, chronic pain, GERD, hypothyroidism, HTN, HLD, mild aortic stenosis, and chronic venous insufficiency.   She was getting a nerve block prior to ORIF left wrist yesterday, 4/18, when she began to have severe chest pain. ECG showed diffuse STDs. Surgery was cancelled and she was taken to [facility]. She has been admitted to the hospitalist team for chest pain work up. Cardiology team has been consulted. With recommendations for checking myoview, TTE, and follow troponins. She is not currently having any chest pain or SOB.  Patient seen on 3E19. Sensation is coming back in her fingers which she is happy about. Pain is controlled with current daily pain management regimen. She is ready to get her wrist fixed so she can start the recovery process.   Past Medical History:  Diagnosis Date  . Anemia    iron deficiency hx.has had iron infusions before   . Chronic low back pain   . Chronic pain disorder   . Complication of anesthesia    severe claustrophobia  . Constipation    r/t use of pain meds.Takes OTC meds or eats prunes  . GERD (gastroesophageal reflux disease)    takes Omeprazole daily  . Heart murmur    "slight"  . History of bronchitis    20+ yrs ago  . History of kidney stones   . History of prolapse of bladder   . History of shingles   . Hypertension    takes Losartan daily  . Hypothyroidism    takes Synthroid daily  . Joint swelling   . Neck pain    bone spurs at base of head per pt  . Osteoarthritis    lumbar,cervical,joints  . Pneumonia    hx of > 20 yrs ago  . Shortness of breath    occasionally and with exertion. Albuterol inhaler as needed  . Spinal headache 1991   blood patch placed  . Spondylitis ([place])   . Unsteady gait    occasionally  . Urinary urgency     Past Surgical History:  Procedure Laterality Date  . ABDOMINAL HYSTERECTOMY    . ANTERIOR FUSION CERVICAL SPINE     x2 -C4-7  . BUNIONECTOMY Bilateral   . COLONOSCOPY    . CYSTOSCOPY W/ URETEROSCOPY  2012  . EYE SURGERY Bilateral    cataract /lens implant  . HOLMIUM LASER APPLICATION Left 3/55/9741   Procedure: HOLMIUM LASER APPLICATION;  Surgeon: Malka So, MD;  Location: [facility];  Service: Urology;  Laterality: Left;  . INSERTION OF MESH N/A 07/15/2014   Procedure: INSERTION OF MESH;  Surgeon: Adin Hector, MD;  Location: [facility];  Service: General;  Laterality: N/A;  . JOINT REPLACEMENT Right 2012   shoulder  . LAPAROSCOPIC CHOLECYSTECTOMY W/ CHOLANGIOGRAPHY  2012   Dr Ninfa Linden  . NASAL SINUS SURGERY    . RADIOLOGY WITH ANESTHESIA N/A 05/09/2014   Procedure: ADULT SEDATION WITH ANESTHESIA/MRI CERVICAL SPINE WITHOUT CONTRAST;  Surgeon: Medication Radiologist, MD;  Location: [facility];  Service: Radiology;  Laterality: N/A;  DR. HAWKS/MRI  . right knee arthroscopy     d/t meniscal tear  . SHOULDER ARTHROSCOPY W/ ROTATOR CUFF REPAIR Bilateral three times each over several yrs  . THUMB ARTHROSCOPY Left   . 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   Procedure: RIGHT TOTAL  KNEE ARTHROPLASTY;  Surgeon: Dorna Leitz, MD;  Location: [place];  Service: Orthopedics;  Laterality: Right;  . UMBILICAL HERNIA REPAIR N/A 07/15/2014   Procedure: LAPAROSCOPIC UMBILICAL AND INFRAUMBILICAL HERNIA;  Surgeon: Adin Hector, MD;  Location: [facility];  Service: General;  Laterality: N/A;   Social History   Socioeconomic History  . Marital status: Divorced    Spouse name: Not on file  . Number of children: Not on file  . Years of education: Not on file  . Highest education level: Not on file  Occupational History  . Not on file  Tobacco Use  . Smoking status: Former Smoker    Packs/day: 0.50    Years: 2.00    Pack years: 1.00    Types: Cigarettes    Quit date: 02/07/1992     Years since quitting: 29.1  . Smokeless tobacco: Never Used  Vaping Use  . Vaping Use: Never used  Substance and Sexual Activity  . Alcohol use: No  . Drug use: No  . Sexual activity: Not on file  Other Topics Concern  . Not on file  Social History Narrative  . Not on file   Social Determinants of Health   Financial Resource Strain: Not on file  Food Insecurity: Not on file  Transportation Needs: Not on file  Physical Activity: Not on file  Stress: Not on file  Social Connections: Not on file   Family History  Problem Relation Age of Onset  . Stroke Father    Allergies  Allergen Reactions  . Dilaudid [Hydromorphone Hcl] Shortness Of Breath  . Gabapentin Other (See Comments)    Hoarseness , headache and sore throat  . Latex Rash    Severe rash  . Lyrica [Pregabalin] Other (See Comments)    No balance , had to walk with cane , Blurred vision,weakness.  . Oxycodone Shortness Of Breath and Other (See Comments)    Cannot breathe.  . Singulair [Montelukast Sodium] Shortness Of Breath  . Singulair [Montelukast] Shortness Of Breath and Other (See Comments)    Vision issues, also  . Banana Other (See Comments)    Abdominal cramping   . Ciprofloxacin Hcl Nausea And Vomiting and Other (See Comments)    Nausea and vomiting with by mouth form  . Codeine Other (See Comments)    Hallucinations  . Methadone Nausea And Vomiting and Other (See Comments)    Severe nausea and vomiting  . Metronidazole Nausea And Vomiting and Other (See Comments)    Gastric pain  . Oysters [Shellfish Allergy] Other (See Comments)    "Terrible gastric upset and cramping."  . Cantaloupe (Diagnostic) Other (See Comments)    Abdominal cramping  . Ciprofloxacin Nausea And Vomiting and Other (See Comments)    Nausea and vomiting with by mouth form  . Clindamycin/Lincomycin Diarrhea and Nausea Only  . Donepezil Diarrhea and Other (See Comments)    Severe diarrhea  . Sulfa Antibiotics Diarrhea and  Other (See Comments)    GI issues   . Tape Other (See Comments)    Severe rash  . Zetia [Ezetimibe] Diarrhea  . Elemental Sulfur Nausea And Vomiting  . Iodine Rash  . Other Rash    All Antibiotic ointments/ creams  . Oyster Shell Rash  . Penicillin G Nausea And Vomiting and Rash  . Penicillins Nausea And Vomiting and Rash    Has patient had a PCN reaction causing immediate rash, facial/tongue/throat swelling, SOB or lightheadedness with hypotension: Yes Has patient had a  PCN reaction causing severe rash involving mucus membranes or skin necrosis: No Has patient had a PCN reaction that required hospitalization No Has patient had a PCN reaction occurring within the last 10 years: No If all of the above answers are "NO", then may proceed with Cephalosporin use.   . Povidone Iodine Rash and Other (See Comments)    Oyster shell products- Rash   . Skintegrity Hydrogel [Skin Protectants, Misc.] Rash  . Tapentadol Other (See Comments)    Nightmares   Prior to Admission medications   Medication Sig Start Date End Date Taking? Authorizing Provider  albuterol (VENTOLIN HFA) 108 (90 Base) MCG/ACT inhaler Inhale 2 puffs into the lungs every 4 (four) hours as needed for shortness of breath.    Yes [provider]  carboxymethylcellulose (REFRESH PLUS) 0.5 % SOLN Place 1 drop into both eyes in the morning, at noon, and at bedtime.   Yes [provider]  cholecalciferol (VITAMIN D) 1000 UNITS tablet Take 1,000 Units by mouth 2 (two) times daily.   Yes [provider]  diclofenac sodium (VOLTAREN) 1 % GEL Apply 2 g topically 2 (two) times daily as needed (for pain).   Yes [provider]  Digestive Enzymes (ENZYME DIGEST PO) Take 1 tablet by mouth daily as needed (as directed).   Yes [provider]  diphenhydrAMINE (BENADRYL) 25 MG tablet Take 50 mg by mouth in the morning, at noon, in the evening, and at bedtime.   Yes [provider]  ENSURE  PLUS (ENSURE PLUS) LIQD Take 237 mLs by mouth daily as needed (for supplementation).   Yes [provider]  fish oil-omega-3 fatty acids 1000 MG capsule Take 1,000 mg by mouth 2 (two) times daily.    Yes [provider]  furosemide (LASIX) 20 MG tablet Take 20 mg by mouth daily as needed for fluid.   Yes [provider]  HYDROcodone-acetaminophen (NORCO) 10-325 MG tablet Take 1-2 tablets by mouth every 6 (six) hours as needed for severe pain. Patient taking differently: Take 1 tablet by mouth See admin instructions. Take 1 tablet by mouth four to five times a day 07/16/16  Yes Gary Fleet, PA-C  levothyroxine (SYNTHROID, LEVOTHROID) 50 MCG tablet Take 50 mcg by mouth every morning.    Yes [provider]  loperamide (IMODIUM A-D) 2 MG tablet Take 4 mg by mouth as needed for diarrhea or loose stools.    Yes [provider]  losartan (COZAAR) 25 MG tablet Take 25 mg by mouth daily.   Yes [provider]  magnesium 30 MG tablet Take 30 mg by mouth daily.    Yes [provider]  methocarbamol (ROBAXIN) 500 MG tablet Take 500 mg by mouth 3 (three) times daily as needed for muscle spasms. 12/03/19  Yes [provider]  Multiple Vitamins-Minerals (MULTIVITAMIN PO) Take 1 tablet by mouth daily.   Yes [provider]  omeprazole (PRILOSEC) 20 MG capsule Take 20 mg by mouth See admin instructions. Take 20 mg by mouth in the morning before breakfast and an additional 20 mg during the day as needed for reflux   Yes [provider]  ondansetron (ZOFRAN) 8 MG tablet Take 1 tablet (8 mg total) by mouth every 8 (eight) hours as needed for nausea or vomiting. 06/25/17  Yes Daleen Bo, MD  POTASSIUM PO Take 1 tablet by mouth daily.   Yes [provider]  predniSONE (DELTASONE) 5 MG tablet Take 5 mg by mouth daily  with breakfast. 12/03/19  Yes [provider]  Probiotic Product (PROBIOTIC DAILY PO) Take 1 capsule  by mouth daily.    Yes [provider]  prochlorperazine (COMPAZINE) 10 MG tablet Take 1 tablet (10 mg total) by mouth 2 (two) times daily as needed for nausea or vomiting (or headache, may take with benadryl 32m). 04/25/20  Yes SGareth Morgan MD  promethazine (PHENERGAN) 12.5 MG tablet Take 12.5 mg by mouth every 6 (six) hours as needed for nausea or vomiting.   Yes [provider]  RESTASIS 0.05 % ophthalmic emulsion Place 1 drop into both eyes daily. 09/18/19  Yes [provider]  triamcinolone (NASACORT) 55 MCG/ACT AERO nasal inhaler Place 1-2 sprays into the nose 2 (two) times daily as needed (for seasonal allergies). 07/14/17  Yes [provider]  Turmeric 500 MG CAPS Take 500 mg by mouth at bedtime.    Yes [provider]  vitamin B-12 (CYANOCOBALAMIN) 250 MCG tablet Take 250 mcg by mouth at bedtime.    Yes [provider]  aspirin EC 81 MG tablet Take 81 mg by mouth daily.    [provider]   DG Chest Portable 1 View  Result Date: 03/16/2021 CLINICAL DATA:  Chest pain EXAM: PORTABLE CHEST 1 VIEW COMPARISON:  04/26/2020 FINDINGS: Cardiac shadow is enlarged but stable accentuated by the portable technique. Aortic calcifications are noted. Lungs are well aerated bilaterally. Mild vascular congestion is seen. No interstitial edema is noted. No infiltrate is seen. Postsurgical changes in the cervical spine are noted. IMPRESSION: Mild vascular congestion without other acute abnormality. Electronically Signed   By: MInez CatalinaM.D.   On: 03/16/2021 17:51   Family History Reviewed and non-contributory, no pertinent history of problems with bleeding or anesthesia     Review of Systems 14 system ROS conducted and negative except for that noted in HPI   OBJECTIVE  Vitals: Patient Vitals for the past 8 hrs:  BP Temp Temp src Pulse Resp SpO2 Weight  03/17/21 0453 131/64 97.7 F (36.5 C) Oral 80 19 98 % --  03/17/21 0057 124/66  98.6 F (37 C) Oral 89 20 97 % 60.7 kg   General: Alert, no acute distress Cardiovascular: Warm extremities noted. Pitting edema of the lower legs. Legs are wrapped due to lower extremity wounds.  Respiratory: No cyanosis, no use of accessory musculature GI: No organomegaly, abdomen is soft and non-tender Skin: Legs are erythematous and wrapped Neurologic: Sensation intact distally save for the below mentioned MSK exam Psychiatric: Patient is competent for consent with normal mood and affect Lymphatic: No swelling obvious and reported other than the area involved in the exam below  Extremities  RUE: Actively moves RUE without pain. Non-tender to palpation throughout. NVI.  LUE: Splint CDI. Skin intact though cannot assess fully beneath splint. Nontender to palpation proximally. Swelling and ecchymosis of left hand. Wiggles fingers slightly. Warm well perfused hand.  Bilateral lower extremities: non-tender to palpation throughout. Actively moves lower extremity without pain. Legs are erythematous and wrapped.   Test Results Imaging Xrays from our office demonstrate displaced intra-articular distal radius fracture  Labs cbc Recent Labs    03/16/21 1800  WBC 7.3  HGB 10.7*  HCT 34.2*  PLT 197    Labs inflam No results for input(s): CRP in the last 72 hours.  Invalid input(s): ESR  Labs coag No results for input(s): INR, PTT in the last 72 hours.  Invalid input(s): PT  Recent Labs  03/16/21 1800  NA 142  K 3.8  CL 110  CO2 24  GLUCOSE 102*  BUN 12  CREATININE 0.61  CALCIUM 8.9     ASSESSMENT AND PLAN: 80 y.o. female with the following: Left displaced intra-articular distal radius fracture  This patient has been admitted to the hospitalist team for a chest pain work-up. Cardiology has been consulted. She is undergoing a chest pain work-up at this time. Currently asymptomatic.   Discussion about the patient's wrist fracture was held again. In order to improve  function, quicker recovery, and increased likelyhood of healing, we recommend surgical fixation. The risks benefits and alternatives were discussed with the patient including but not limited to the risks of nonoperative treatment, versus surgical intervention including infection, bleeding, nerve injury, periprosthetic fracture, the need for revision surgery, blood clots, cardiopulmonary complications, morbidity, mortality, among others, and they were willing to proceed.   If she is cleared by medicine and cardiology team, we will plan for surgical fixation tomorrow.   - Plan: ORIF left distal radius fracture (tomorrow, 4/20, if cleared) - Will need cardiac and medical clearance  - Keep NPO at midnight - Weight Bearing Status/Activity: NWB LUE - VTE Prophylaxis: per medicine - Pain control: Continue current pain management regimen  Noemi Chapel, PA-C 03/17/2021

## 2021-03-17 NOTE — Progress Notes (Signed)
Orthopedic Tech Progress Note Patient Details:  Isabel Barnes 1941/06/09 [id number]  Ortho Devices Type of Ortho Device: Roland Rack boot Ortho Device/Splint Location: BLE Ortho Device/Splint Interventions: Ordered,Application   Post Interventions Patient Tolerated: Well Instructions Provided: Care of device   Donald Pore 03/17/2021, 8:30 AM

## 2021-03-17 NOTE — Consult Note (Signed)
WOC Nurse Consult Note: Patient receiving care in [facility] 3E19. Reason for Consult: BLE lymphedema and blistering Wound type: chronic venous insufficiency and lymphedema. Patient informs me she has lymphedema pumps at home and does her own leg wrapping. Today I found bilateral gaiter areas "strangled" by localized wrappings the patient had placed. Each with a variety of products under the coban wrappings such as telfa, and incontinence pads.  She agrees to my plan of care outlined below. Pressure Injury POA: Yes/No/NA Measurement: Wound bed: Right pretibial wound is pink, no drainage. Left posterior calf area is pink, no drainage. Drainage (amount, consistency, odor)  Periwound: hemosiderin staining Dressing procedure/placement/frequency: PRIOR TO CALLING ORTHO TECH FOR UNNA BOOT PLACEMENT/CHANGE, perform the following: wash legs with soap and water, pat dry. Apply Sween Moisturizing Ointment (pink and white tube in clean utility) to intact skin. Place a foam dressing over the open areas on the shin of the RLE and posterior calf of the LLE. THEN call Argentina Donovan on Tuesdays and Fridays for BLE unna boot placement. Thank you for the consult.  Discussed plan of care with the patient and bedside nurse.  WOC nurse will not follow at this time.  Please re-consult the WOC team if needed.  Helmut Muster, RN, MSN, CWOCN, CNS-BC, pager [phone]

## 2021-03-18 ENCOUNTER — Inpatient Hospital Stay: Admit: 2021-03-18 | Payer: Medicare Other | Admitting: Student

## 2021-03-18 SURGERY — OPEN REDUCTION INTERNAL FIXATION (ORIF) RADIAL FRACTURE
Anesthesia: General | Laterality: Left

## 2021-03-18 NOTE — Anesthesia Preprocedure Evaluation (Addendum)
Anesthesia Evaluation  Patient identified by MRN, date of birth, ID band Patient awake    Reviewed: Allergy & Precautions, NPO status , Patient's Chart, lab work & pertinent test results  History of Anesthesia Complications (+) POST - OP SPINAL HEADACHE and history of anesthetic complications  Airway Mallampati: II  TM Distance: >3 FB Neck ROM: Full    Dental   Pulmonary shortness of breath (albuterol PRN) and with exertion, former smoker,    Pulmonary exam normal        Cardiovascular hypertension, Pt. on medications Normal cardiovascular exam+ Valvular Problems/Murmurs AS   Echocardiogram 03/17/21: 1. Left ventricular ejection fraction, by estimation, is 65 to 70%. The left ventricle has normal function. The left ventricle has no regional wall motion abnormalities. There is mild concentric left ventricular hypertrophy. Left ventricular diastolic parameters are consistent with Grade II diastolic dysfunction (pseudonormalization).  2. Right ventricular systolic function is normal. The right ventricular size is normal. There is moderately elevated pulmonary artery systolic pressure. The estimated right ventricular systolic pressure is 44.3 mmHg.  3. Left atrial size was severely dilated.  4. The mitral valve is normal in structure. Trivial mitral valve regurgitation. Mild mitral stenosis. The mean mitral valve gradient is 5.4 mmHg with average heart rate of 83 bpm. Moderate to severe mitral annular calcification.  5. The aortic valve is tricuspid. There is moderate calcification of the aortic valve. There is moderate thickening of the aortic valve. Aortic valve regurgitation is not visualized. Moderate to severe aortic valve stenosis. Aortic valve area, by VTI measures 0.81 cm. Aortic valve mean gradient measures 27.8 mmHg. Aortic valve Vmax measures 3.56 m/s. Aortic valve acceleration time measures 119 msec.  6. The inferior vena cava is  dilated in size with <50% respiratory variability, suggesting right atrial pressure of 15 mmHg.   Nuclear stress test 03/17/21: PACs through study. 1 mm horizontal ST depressions with stress as noted above. Hyperdynamic LVEF No perfusion defects There is small subdiaphragmatic activity seen in the field during rest and stress; no evidence of patient movement. TID of 1.28 reported but without qualitative dilation and with LVEDVi of 36 ml/m2. RECOMMENDATIONS/CONCLUSIONS Negative stress test for ischemia or infarction. TID 1.28 as above. The study is consistent with a low risk study.     Neuro/Psych  Headaches, Depression    GI/Hepatic Neg liver ROS, GERD  Medicated,  Endo/Other  Hypothyroidism   Renal/GU negative Renal ROS Bladder dysfunction      Musculoskeletal  (+) Arthritis , Osteoarthritis,  Left distal radius fracture   Abdominal   Peds  Hematology  (+) Blood dyscrasia, anemia ,   Anesthesia Other Findings   Reproductive/Obstetrics                        Anesthesia Physical Anesthesia Plan  ASA: III  Anesthesia Plan: MAC and Regional   Post-op Pain Management:  Regional for Post-op pain   Induction: Intravenous  PONV Risk Score and Plan: 2 and Ondansetron, Dexamethasone, Propofol infusion and Treatment may vary due to age or medical condition  Airway Management Planned: Simple Face Mask, Natural Airway and Nasal Cannula  Additional Equipment: None  Intra-op Plan:   Post-operative Plan:   Informed Consent: I have reviewed the patients History and Physical, chart, labs and discussed the procedure including the risks, benefits and alternatives for the proposed anesthesia with the patient or authorized representative who has indicated his/her understanding and acceptance.     Dental advisory given  Plan Discussed with: CRNA  Anesthesia Plan Comments: (PAT note written 03/19/2021 by Myra Gianotti, PA-C. Per H/P: She was  scheduled for a left distal radius ORIF with Dr. Griffin Basil on 03/16/21 and while receiving the nerve block prior to the procedure she began to develop severe chest pains. ECG showed diffuse ST depressions. Surgery was cancelled and she was taken to [facility] Emergency Department. She was admitted to the hospitalist team for chest pain work up. Cardiology team was consulted, stress test was low risk and she was discharged home  ECHO 03/17/21: 1. Left ventricular ejection fraction, by estimation, is 65 to 70%. The  left ventricle has normal function. The left ventricle has no regional  wall motion abnormalities. There is mild concentric left ventricular  hypertrophy. Left ventricular diastolic  parameters are consistent with Grade II diastolic dysfunction  (pseudonormalization).  2. Right ventricular systolic function is normal. The right ventricular  size is normal. There is moderately elevated pulmonary artery systolic  pressure. The estimated right ventricular systolic pressure is 22.0 mmHg.  3. Left atrial size was severely dilated.  4. The mitral valve is normal in structure. Trivial mitral valve  regurgitation. Mild mitral stenosis. The mean mitral valve gradient is 5.4  mmHg with average heart rate of 83 bpm. Moderate to severe mitral annular  calcification.  5. The aortic valve is tricuspid. There is moderate calcification of the  aortic valve. There is moderate thickening of the aortic valve. Aortic  valve regurgitation is not visualized. Moderate to severe aortic valve  stenosis. Aortic valve area, by VTI  measures 0.81 cm. Aortic valve mean gradient measures 27.8 mmHg. Aortic  valve Vmax measures 3.56 m/s. Aortic valve acceleration time measures 119  msec.  6. The inferior vena cava is dilated in size with <50% respiratory  variability, suggesting right atrial pressure of 15 mmHg.   Comparison(s): Prior images reviewed side by side. Aortic stenosis has  worsened.  Lab  Results      Component                Value               Date                      NA                       142                 03/16/2021                K                        3.8                 03/16/2021                CO2                      24                  03/16/2021                GLUCOSE                  102 (H)             03/16/2021  BUN                      12                  03/16/2021                CREATININE               0.61                03/16/2021                CALCIUM                  8.9                 03/16/2021                GFRNONAA                 >60                 03/16/2021                GFRAA                    >60                 06/25/2017           Lab Results      Component                Value               Date                      WBC                      7.3                 03/16/2021                HGB                      10.7 (L)            03/16/2021                HCT                      34.2 (L)            03/16/2021                MCV                      94.2                03/16/2021                PLT                      197                 03/16/2021          )       Anesthesia Quick Evaluation

## 2021-03-19 ENCOUNTER — Encounter (HOSPITAL_COMMUNITY): Payer: Self-pay | Admitting: Orthopedic Surgery

## 2021-03-19 NOTE — Progress Notes (Addendum)
Anesthesia Chart Review: Isabel Barnes   Case: [id number] Date/Time: 03/20/21 1215   Procedure: OPEN REDUCTION INTERNAL FIXATION (ORIF) DISTAL RADIAL FRACTURE (Left )   Anesthesia type: Choice   Pre-op diagnosis: LEFT DISTAL RADIUS FRACTURE   Location: [facility] ROOM 11 / [facility]   Surgeons: Marchia Bond, MD      DISCUSSION: Patient is an 80 year old female scheduled for the above procedure. Surgery was initially planned for 03/16/21 at the [facility], but she developed chest pain/SOB (x ~ 4-5 minutes) and EKG changes (ST at 113 bpm, inferolateral ST depression) following LUE nerve block. (Records scanned under Media tab do not indicate medication used for nerve block, but indicated she had received Versed 1.5 mg [62m/mL] and Fentanyl 1 mL [50 mcg/mL].) IV metoprolol given to lower HR, case cancelled, [facility]. Symptoms and EKG changes improved in ED. HS Troponin peaked at 158. Cardiology was consulted (seen by PGwyndolyn Kaufman MD and ACherlynn Kaiser MD). Echo showed LVEF 65-70%, grade II diastolic dysfunction, RVSP 49.3 mmHg, severely dilated LA, mild MS, moderate-severe AS (worsened from mild AS in 2020). Decision made to also pursue preoperative nuclear stress test and "Now with plans to repair 03/18/21 if NST negative" per 03/17/21 note by KRoby Lofts PA-C. However, Hospitalist decided to discharge patient prior to surgery once stress test results came back as low risk. Cardiologist Dr. AMargaretann Lovelessdid evaluate patient prior to discharge and wrote "All available labs, radiology testing, previous records reviewed. She has a low risk nuclear stress test and no recurrent chest pain. TID is elevated but reading physician suggests this is overall low risk picture." No additional inpatient testing ordered but did advise out-patient cardiology follow-up for now likely moderate to severe aortic stenosis.     History includes former smoker (quit 02/07/92), spinal  headache (s/p blood patch 1991), murmur (mild MS, moderate-severe AS 03/17/21 echo), HTN, claustrophobia, GERD, exertional dyspnea, anemia, LE lymphedema, chronic neck and low back pain, unsteady gait, anemia, hypothyroidism, neck surgery (ACDF C3-4 & C5-6)  By medication list, she is on prednisone 5 mg each morning. 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  She is also scheduled for L1 kyphoplasty by DPhylliss Bob MD on 03/25/21.   In-patient COVID-19 tet negative 03/16/21. Anesthesia to evaluate on the day of surgery. ([facility], but any additional records are still pending.) UPDATE 03/20/21 9:50 AM: [facility] a 01/08/21 and not the aborted 03/16/21 surgery. On 01/08/21 she underwent right foot medial sesamoidectomy and lateral sesamoid shaving under MAC and regional anesthesia using a right popliteal block with what appears to be mepivacaine 1.5% 15 mL and bupivacaine 0.5% 15 mL, although medications are handwritten so somewhat difficult to make out.    VS:  BP Readings from Last 3 Encounters:  03/17/21 128/70  09/02/20 125/61  04/25/20 (!) 151/61   Pulse Readings from Last 3 Encounters:  03/17/21 96  09/02/20 90  04/25/20 88     PROVIDERS: HArdith Dark PA-C is PCP ([place] - Current primary cardiologist appears to [facility]/20. However, last cardiology evaluation was by PGwyndolyn Kaufman MD & ACherlynn Kaiser MD during her 03/16/21-03/17/21 admission. She was previously seeing AKathlyn Sacramento MD but switched to Dr. BLamonte Sakaidue to insurance purposes.  - Dohmeier, CAsencion Partridge MD is neurologist. New evaluation  12/13/19 for short term memory problems on 03/16/21 and 03/17/21.   LABS: Labs as of 03/17/21 include: Lab Results  Component Value Date   WBC 7.3 03/16/2021   HGB 10.7  (L) 03/16/2021   HCT 34.2 (L) 03/16/2021   PLT 197 03/16/2021   GLUCOSE 102 (H) 03/16/2021   CHOL 219 (H) 03/16/2021   TRIG 48 03/16/2021   HDL 79 03/16/2021   LDLCALC 130 (H) 03/16/2021   NA 142 03/16/2021   K 3.8 03/16/2021   CL 110 03/16/2021   CREATININE 0.61 03/16/2021   BUN 12 03/16/2021   CO2 24 03/16/2021   LFTs WNL 01/20/21 [facility]).   IMAGES: 1V PCXR 03/16/21: FINDINGS: Cardiac shadow is enlarged but stable accentuated by the portable technique. Aortic calcifications are noted. Lungs are well aerated bilaterally. Mild vascular congestion is seen. No interstitial edema is noted. No infiltrate is seen. Postsurgical changes in the cervical spine are noted. IMPRESSION: Mild vascular congestion without other acute abnormality.  CT L-spine 01/23/21: IMPRESSION: 1. Subacute appearing L1 superior endplate compression fracture with up to 30% loss of height. No complicating features. 2. No other acute osseous abnormality in the lumbar spine. Chronic lumbar spine degeneration appears stable since 2019, and most advanced at L5-S1 associated with chronic grade 1 anterolisthesis there. 3. Aortic Atherosclerosis (ICD10-I70.0).  CT C-spine 04/25/20: IMPRESSION: 1. Multilevel degenerative disc disease and facet hypertrophy. Mild anterolisthesis of C4 on C5 and C5 on C6 is likely degenerative. No significant bony canal stenosis. 2. Multilevel neural foraminal stenosis, more so on the left. 3. Anterior fusion at C3-C4 and C6-C7.    EKG: 03/16/21:  Sinus rhythm Short PR interval [PR 98 ms] Borderline right axis deviation Minimal ST depression, diffuse leads Borderline prolonged QT interval [QT 399, QTcB 497 ms] Confirmed by Ronnald Nian, Adam ([id number]) on 03/16/2021 4:46:15 PM   CV: Nuclear stress test 03/17/21:  ST segment depression was noted during stress in the II, III, aVF, V4 and V5 leads.  No T wave inversion was noted during stress.  This is a low risk study.  The  left ventricular ejection fraction is hyperdynamic (>65%). IMPRESSIONS PACs through study. 1 mm horizontal ST depressions with stress as noted above. Hyperdynamic LVEF No perfusion defects There is small subdiaphragmatic activity seen in the field during rest and stress; no evidence of patient movement. TID of 1.28 reported but without qualitative dilation and with LVEDVi of 36 ml/m2. RECOMMENDATIONS/CONCLUSIONS Negative stress test for ischemia or infarction.  TID 1.28 as above. The study is consistent with a low risk study.   Echo 03/17/21: IMPRESSIONS  1. Left ventricular ejection fraction, by estimation, is 65 to 70%. The left ventricle has normal function. The left ventricle has no regional wall motion abnormalities. There is mild concentric left ventricular hypertrophy. Left ventricular diastolic parameters are consistent with Grade II diastolic dysfunction (pseudonormalization).  2. Right ventricular systolic function is normal. The right ventricular size is normal. There is moderately elevated pulmonary artery systolic pressure. The estimated right ventricular systolic pressure is 45.3 mmHg.  3. Left atrial size was severely dilated.  4. The mitral valve is normal in structure. Trivial mitral valve regurgitation. Mild mitral stenosis. The mean mitral valve gradient is 5.4 mmHg with average heart rate of 83 bpm. Moderate to severe mitral annular calcification.  5. The aortic valve is tricuspid. There is moderate calcification of the aortic valve. There is moderate thickening of the aortic valve. Aortic valve regurgitation is not visualized. Moderate to severe aortic valve stenosis.  Aortic valve area, by VTI measures 0.81 cm. Aortic valve mean gradient measures 27.8 mmHg. Aortic valve Vmax measures 3.56 m/s. Aortic valve acceleration time measures 119 msec.  6. The inferior vena cava is dilated in size with <50% respiratory variability, suggesting right atrial pressure of 15 mmHg.   - Comparison(s): Prior images reviewed side by side. Aortic stenosis has worsened. [On previous echo 05/01/19: LVEF > 65%, RVSP 38 mmHg, severe mitral annular calcification, mild MR, mild TR, mild AS with AVA 1.39 cm, AV mean gradient 16.0 mmHg, AV peak gradient 26.5 mmHg]    Carotid US 09/23/20: Summary:  - Right Carotid: Velocities in the right ICA are consistent with a 1-39% stenosis.  - Left Carotid: Velocities in the left ICA are consistent with a 1-39% stenosis.  - Vertebrals: Bilateral vertebral arteries demonstrate antegrade flow.  - Subclavians: Normal flow hemodynamics were seen in bilateral subclavian arteries.    BLE Arterial Doppler Study/ABI 09/23/20: Summary:  - Right: Resting right ankle-brachial index is within normal range. No evidence of significant right lower extremity arterial disease. The right toe-brachial index is abnormal.  - Left: Resting left ankle-brachial index is within normal range. No evidence of significant left lower extremity arterial disease. The left toe-brachial index is normal.    Past Medical History:  Diagnosis Date  . Anemia    iron deficiency hx.has had iron infusions before   . Chronic low back pain   . Chronic pain disorder   . Complication of anesthesia    severe claustrophobia  . Constipation    r/t use of pain meds.Takes OTC meds or eats prunes  . GERD (gastroesophageal reflux disease)    takes Omeprazole daily  . Heart murmur    "slight"  . History of bronchitis    20+ yrs ago  . History of kidney stones   . History of prolapse of bladder   . History of shingles   . Hypertension    takes Losartan daily  . Hypothyroidism    takes Synthroid daily  . Joint swelling   . Neck pain    bone spurs at base of head per pt  . Osteoarthritis    lumbar,cervical,joints  . Pneumonia    hx of > 20 yrs ago  . Shortness of breath    occasionally and with exertion. Albuterol inhaler as needed  . Spinal headache 1991   blood patch  placed  . Spondylitis ([place])   . Unsteady gait    occasionally  . Urinary urgency     Past Surgical History:  Procedure Laterality Date  . ABDOMINAL HYSTERECTOMY    . ANTERIOR FUSION CERVICAL SPINE     x2 -C4-7  . BUNIONECTOMY Bilateral   . COLONOSCOPY    . CYSTOSCOPY W/ URETEROSCOPY  2012  . EYE SURGERY Bilateral    cataract /lens implant  . HOLMIUM LASER APPLICATION Left 0/73/7106   Procedure: HOLMIUM LASER APPLICATION;  Surgeon: Malka So, MD;  Location: [facility];  Service: Urology;  Laterality: Left;  . INSERTION OF MESH N/A 07/15/2014   Procedure: INSERTION OF MESH;  Surgeon: Adin Hector, MD;  Location: [facility];  Service: General;  Laterality: N/A;  . JOINT REPLACEMENT Right 2012   shoulder  . LAPAROSCOPIC CHOLECYSTECTOMY W/ CHOLANGIOGRAPHY  2012   Dr Ninfa Linden  . NASAL SINUS SURGERY    . RADIOLOGY WITH ANESTHESIA N/A 05/09/2014   Procedure: ADULT SEDATION WITH ANESTHESIA/MRI CERVICAL SPINE WITHOUT CONTRAST;  Surgeon: Medication Radiologist, MD;  Location: [facility];  Service: Radiology;  Laterality: N/A;  DR. HAWKS/MRI  . right knee arthroscopy     d/t meniscal tear  . SHOULDER ARTHROSCOPY W/ ROTATOR CUFF REPAIR Bilateral three times each over several yrs  . THUMB ARTHROSCOPY Left   . 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 UMBILICAL HERNIA REPAIR N/A 07/15/2014   Procedure: LAPAROSCOPIC UMBILICAL AND INFRAUMBILICAL HERNIA;  Surgeon: Adin Hector, MD;  Location: [facility];  Service: General;  Laterality: N/A;    MEDICATIONS: No current facility-administered medications for this encounter.   Marland Kitchen albuterol (VENTOLIN HFA) 108 (90 Base) MCG/ACT inhaler  . aspirin EC 81 MG tablet  . carboxymethylcellulose (REFRESH PLUS) 0.5 % SOLN  . cholecalciferol (VITAMIN D) 1000 UNITS tablet  . diclofenac sodium (VOLTAREN) 1 % GEL  .  Digestive Enzymes (ENZYME DIGEST PO)  . diphenhydrAMINE (BENADRYL) 25 MG tablet  . ENSURE PLUS (ENSURE PLUS) LIQD  . fish oil-omega-3 fatty acids 1000 MG capsule  . furosemide (LASIX) 20 MG tablet  . HYDROcodone-acetaminophen (NORCO) 10-325 MG tablet  . levothyroxine (SYNTHROID, LEVOTHROID) 50 MCG tablet  . loperamide (IMODIUM A-D) 2 MG tablet  . losartan (COZAAR) 25 MG tablet  . magnesium 30 MG tablet  . methocarbamol (ROBAXIN) 500 MG tablet  . Multiple Vitamins-Minerals (MULTIVITAMIN PO)  . omeprazole (PRILOSEC) 20 MG capsule  . ondansetron (ZOFRAN) 8 MG tablet  . POTASSIUM PO  . predniSONE (DELTASONE) 5 MG tablet  . Probiotic Product (PROBIOTIC DAILY PO)  . prochlorperazine (COMPAZINE) 10 MG tablet  . promethazine (PHENERGAN) 12.5 MG tablet  . RESTASIS 0.05 % ophthalmic emulsion  . triamcinolone (NASACORT) 55 MCG/ACT AERO nasal inhaler  . Turmeric 500 MG CAPS  . vitamin B-12 (CYANOCOBALAMIN) 250 MCG tablet     Myra Gianotti, PA-C Surgical Short Stay/Anesthesiology [facility] Phone [phone] [facility] Phone [phone] 03/19/2021 12:05 PM

## 2021-03-19 NOTE — Progress Notes (Signed)
Isabel Barnes denies chest pain or shortness of breath. Patient was tested for Covid on 03/16/21 while she was in the hospital, Isabel Barnes said she has not been in quarantine since she was discharged. Patient will be tested for Covid on arrival to the hospital.

## 2021-03-20 ENCOUNTER — Ambulatory Visit (HOSPITAL_COMMUNITY): Payer: Medicare Other

## 2021-03-20 ENCOUNTER — Encounter (HOSPITAL_COMMUNITY)
Admission: RE | Disposition: A | Discharge status: Home / Self Care | Payer: Self-pay | Source: Home / Self Care | Attending: Orthopedic Surgery

## 2021-03-20 ENCOUNTER — Ambulatory Visit (HOSPITAL_COMMUNITY): Payer: Medicare Other | Admitting: Certified Registered"

## 2021-03-20 ENCOUNTER — Encounter (HOSPITAL_COMMUNITY): Payer: Self-pay | Admitting: Orthopedic Surgery

## 2021-03-20 ENCOUNTER — Ambulatory Visit (HOSPITAL_COMMUNITY)
Admission: RE | Admit: 2021-03-20 | Discharge: 2021-03-20 | Disposition: A | Discharge status: Home / Self Care | Payer: Medicare Other | Attending: Orthopedic Surgery | Admitting: Orthopedic Surgery

## 2021-03-20 ENCOUNTER — Other Ambulatory Visit: Payer: Self-pay

## 2021-03-20 DIAGNOSIS — I872 Venous insufficiency (chronic) (peripheral): Secondary | ICD-10-CM | POA: Insufficient documentation

## 2021-03-20 DIAGNOSIS — E785 Hyperlipidemia, unspecified: Secondary | ICD-10-CM | POA: Insufficient documentation

## 2021-03-20 DIAGNOSIS — Z87891 Personal history of nicotine dependence: Secondary | ICD-10-CM | POA: Insufficient documentation

## 2021-03-20 DIAGNOSIS — I350 Nonrheumatic aortic (valve) stenosis: Secondary | ICD-10-CM | POA: Insufficient documentation

## 2021-03-20 DIAGNOSIS — Z7982 Long term (current) use of aspirin: Secondary | ICD-10-CM | POA: Diagnosis not present

## 2021-03-20 DIAGNOSIS — Z881 Allergy status to other antibiotic agents status: Secondary | ICD-10-CM | POA: Insufficient documentation

## 2021-03-20 DIAGNOSIS — Z885 Allergy status to narcotic agent status: Secondary | ICD-10-CM | POA: Insufficient documentation

## 2021-03-20 DIAGNOSIS — Z79899 Other long term (current) drug therapy: Secondary | ICD-10-CM | POA: Insufficient documentation

## 2021-03-20 DIAGNOSIS — I700 Atherosclerosis of aorta: Secondary | ICD-10-CM | POA: Insufficient documentation

## 2021-03-20 DIAGNOSIS — S52572A Other intraarticular fracture of lower end of left radius, initial encounter for closed fracture: Secondary | ICD-10-CM | POA: Insufficient documentation

## 2021-03-20 DIAGNOSIS — Z20822 Contact with and (suspected) exposure to covid-19: Secondary | ICD-10-CM | POA: Insufficient documentation

## 2021-03-20 DIAGNOSIS — W19XXXA Unspecified fall, initial encounter: Secondary | ICD-10-CM | POA: Diagnosis not present

## 2021-03-20 DIAGNOSIS — Z888 Allergy status to other drugs, medicaments and biological substances status: Secondary | ICD-10-CM | POA: Diagnosis not present

## 2021-03-20 DIAGNOSIS — Z7952 Long term (current) use of systemic steroids: Secondary | ICD-10-CM | POA: Diagnosis not present

## 2021-03-20 DIAGNOSIS — I10 Essential (primary) hypertension: Secondary | ICD-10-CM | POA: Diagnosis not present

## 2021-03-20 DIAGNOSIS — Z882 Allergy status to sulfonamides status: Secondary | ICD-10-CM | POA: Insufficient documentation

## 2021-03-20 DIAGNOSIS — I517 Cardiomegaly: Secondary | ICD-10-CM | POA: Insufficient documentation

## 2021-03-20 DIAGNOSIS — Z79890 Hormone replacement therapy (postmenopausal): Secondary | ICD-10-CM | POA: Insufficient documentation

## 2021-03-20 DIAGNOSIS — Z880 Allergy status to penicillin: Secondary | ICD-10-CM | POA: Insufficient documentation

## 2021-03-20 HISTORY — DX: Depression, unspecified: F32.A

## 2021-03-20 HISTORY — PX: OPEN REDUCTION INTERNAL FIXATION (ORIF) DISTAL RADIAL FRACTURE: SHX5989

## 2021-03-20 LAB — SARS CORONAVIRUS 2 BY RT PCR (HOSPITAL ORDER, PERFORMED IN ~~LOC~~ HOSPITAL LAB): SARS Coronavirus 2: NEGATIVE

## 2021-03-20 SURGERY — OPEN REDUCTION INTERNAL FIXATION (ORIF) DISTAL RADIUS FRACTURE
Anesthesia: Monitor Anesthesia Care | Site: Arm Lower | Laterality: Left

## 2021-03-20 MED ORDER — ROPIVACAINE HCL 5 MG/ML IJ SOLN
INTRAMUSCULAR | Status: DC | PRN
  Administered 2021-03-20: 30 mL via PERINEURAL

## 2021-03-20 MED ORDER — ACETAMINOPHEN 500 MG PO TABS
1000.0000 mg | ORAL_TABLET | Freq: Once | ORAL | Status: AC
  Administered 2021-03-20: 1000 mg via ORAL
  Filled 2021-03-20: qty 2

## 2021-03-20 MED ORDER — MIDAZOLAM HCL 2 MG/2ML IJ SOLN: 1.0000 mg | Freq: Once | INTRAMUSCULAR | Status: AC

## 2021-03-20 MED ORDER — FENTANYL CITRATE (PF) 100 MCG/2ML IJ SOLN
INTRAMUSCULAR | Status: AC
  Administered 2021-03-20: 50 ug via INTRAVENOUS
  Filled 2021-03-20: qty 2

## 2021-03-20 MED ORDER — LACTATED RINGERS IV SOLN: INTRAVENOUS | Status: DC

## 2021-03-20 MED ORDER — LIDOCAINE 2% (20 MG/ML) 5 ML SYRINGE
INTRAMUSCULAR | Status: AC
  Filled 2021-03-20: qty 5

## 2021-03-20 MED ORDER — CLONIDINE HCL (ANALGESIA) 100 MCG/ML EP SOLN
EPIDURAL | Status: DC | PRN
  Administered 2021-03-20: 50 ug

## 2021-03-20 MED ORDER — FENTANYL CITRATE (PF) 100 MCG/2ML IJ SOLN: 50.0000 ug | Freq: Once | INTRAMUSCULAR | Status: AC

## 2021-03-20 MED ORDER — FENTANYL CITRATE (PF) 250 MCG/5ML IJ SOLN
INTRAMUSCULAR | Status: AC
  Filled 2021-03-20: qty 5

## 2021-03-20 MED ORDER — CEFAZOLIN SODIUM-DEXTROSE 2-4 GM/100ML-% IV SOLN
INTRAVENOUS | Status: AC
  Filled 2021-03-20: qty 100

## 2021-03-20 MED ORDER — LIDOCAINE 2% (20 MG/ML) 5 ML SYRINGE
INTRAMUSCULAR | Status: DC | PRN
  Administered 2021-03-20: 40 mg via INTRAVENOUS

## 2021-03-20 MED ORDER — FENTANYL CITRATE (PF) 100 MCG/2ML IJ SOLN: 25.0000 ug | INTRAMUSCULAR | Status: DC | PRN

## 2021-03-20 MED ORDER — ONDANSETRON HCL 4 MG/2ML IJ SOLN: 4.0000 mg | Freq: Once | INTRAMUSCULAR | Status: DC | PRN

## 2021-03-20 MED ORDER — PROPOFOL 10 MG/ML IV BOLUS
INTRAVENOUS | Status: DC | PRN
  Administered 2021-03-20: 20 mg via INTRAVENOUS
  Administered 2021-03-20: 10 mg via INTRAVENOUS

## 2021-03-20 MED ORDER — PROPOFOL 1000 MG/100ML IV EMUL
INTRAVENOUS | Status: AC
  Filled 2021-03-20: qty 100

## 2021-03-20 MED ORDER — PROPOFOL 500 MG/50ML IV EMUL
INTRAVENOUS | Status: DC | PRN
  Administered 2021-03-20: 50 ug/kg/min via INTRAVENOUS

## 2021-03-20 MED ORDER — CHLORHEXIDINE GLUCONATE 0.12 % MT SOLN
15.0000 mL | Freq: Once | OROMUCOSAL | Status: AC
  Administered 2021-03-20: 15 mL via OROMUCOSAL
  Filled 2021-03-20: qty 15

## 2021-03-20 MED ORDER — BUPIVACAINE HCL (PF) 0.25 % IJ SOLN
INTRAMUSCULAR | Status: AC
  Filled 2021-03-20: qty 30

## 2021-03-20 MED ORDER — 0.9 % SODIUM CHLORIDE (POUR BTL) OPTIME
TOPICAL | Status: DC | PRN
  Administered 2021-03-20: 1000 mL

## 2021-03-20 MED ORDER — PHENYLEPHRINE 40 MCG/ML (10ML) SYRINGE FOR IV PUSH (FOR BLOOD PRESSURE SUPPORT)
PREFILLED_SYRINGE | INTRAVENOUS | Status: AC
  Filled 2021-03-20: qty 20

## 2021-03-20 MED ORDER — SODIUM CHLORIDE (PF) 0.9 % IJ SOLN
INTRAMUSCULAR | Status: AC
  Filled 2021-03-20: qty 10

## 2021-03-20 MED ORDER — ORAL CARE MOUTH RINSE: 15.0000 mL | Freq: Once | OROMUCOSAL | Status: AC

## 2021-03-20 MED ORDER — DIPHENHYDRAMINE HCL 50 MG/ML IJ SOLN
INTRAMUSCULAR | Status: DC | PRN
  Administered 2021-03-20: 7.25 mg via INTRAVENOUS

## 2021-03-20 MED ORDER — CEFAZOLIN SODIUM-DEXTROSE 2-4 GM/100ML-% IV SOLN
2.0000 g | INTRAVENOUS | Status: AC
  Administered 2021-03-20: 2 g via INTRAVENOUS

## 2021-03-20 MED ORDER — DEXAMETHASONE SODIUM PHOSPHATE 10 MG/ML IJ SOLN
INTRAMUSCULAR | Status: DC | PRN
  Administered 2021-03-20: 5 mg

## 2021-03-20 MED ORDER — MIDAZOLAM HCL 2 MG/2ML IJ SOLN
INTRAMUSCULAR | Status: AC
  Filled 2021-03-20: qty 2

## 2021-03-20 MED ORDER — MIDAZOLAM HCL 2 MG/2ML IJ SOLN
INTRAMUSCULAR | Status: AC
  Administered 2021-03-20: 1 mg via INTRAVENOUS
  Filled 2021-03-20: qty 2

## 2021-03-20 MED ORDER — PROPOFOL 10 MG/ML IV BOLUS
INTRAVENOUS | Status: AC
  Filled 2021-03-20: qty 20

## 2021-03-20 SURGICAL SUPPLY — 59 items
BIT DRILL 2.2 SS TIBIAL (BIT) ×2 IMPLANT
BLADE CLIPPER SURG (BLADE) IMPLANT
BNDG ELASTIC 3X5.8 VLCR STR LF (GAUZE/BANDAGES/DRESSINGS) ×2 IMPLANT
BNDG ELASTIC 4X5.8 VLCR STR LF (GAUZE/BANDAGES/DRESSINGS) ×2 IMPLANT
BNDG ESMARK 4X9 LF (GAUZE/BANDAGES/DRESSINGS) ×2 IMPLANT
CLSR STERI-STRIP ANTIMIC 1/2X4 (GAUZE/BANDAGES/DRESSINGS) IMPLANT
CORD BIPOLAR FORCEPS 12FT (ELECTRODE) ×2 IMPLANT
COVER SURGICAL LIGHT HANDLE (MISCELLANEOUS) ×2 IMPLANT
COVER WAND RF STERILE (DRAPES) IMPLANT
CUFF TOURN SGL QUICK 18X4 (TOURNIQUET CUFF) ×2 IMPLANT
CUFF TOURN SGL QUICK 24 (TOURNIQUET CUFF)
CUFF TRNQT CYL 24X4X16.5-23 (TOURNIQUET CUFF) IMPLANT
DECANTER SPIKE VIAL GLASS SM (MISCELLANEOUS) IMPLANT
DRAPE INCISE IOBAN 66X45 STRL (DRAPES) IMPLANT
DRAPE OEC MINIVIEW 54X84 (DRAPES) ×2 IMPLANT
DRAPE U-SHAPE 47X51 STRL (DRAPES) ×2 IMPLANT
DRSG XEROFORM 1X8 (GAUZE/BANDAGES/DRESSINGS) ×2 IMPLANT
ELECT REM PT RETURN 9FT ADLT (ELECTROSURGICAL) ×2
ELECTRODE REM PT RTRN 9FT ADLT (ELECTROSURGICAL) ×1 IMPLANT
GAUZE SPONGE 4X4 12PLY STRL (GAUZE/BANDAGES/DRESSINGS) ×2 IMPLANT
GAUZE XEROFORM 1X8 LF (GAUZE/BANDAGES/DRESSINGS) IMPLANT
GLOVE BIO SURGEON STRL SZ7 (GLOVE) ×2 IMPLANT
GLOVE BIOGEL PI ORTHO PRO 7.5 (GLOVE) ×1
GLOVE ORTHO TXT STRL SZ7.5 (GLOVE) ×2 IMPLANT
GLOVE PI ORTHO PRO STRL 7.5 (GLOVE) ×1 IMPLANT
GLOVE SURG UNDER POLY LF SZ7 (GLOVE) ×2 IMPLANT
GOWN STRL REUS W/ TWL LRG LVL3 (GOWN DISPOSABLE) IMPLANT
GOWN STRL REUS W/TWL LRG LVL3 (GOWN DISPOSABLE)
K-WIRE 1.6 (WIRE) ×4
K-WIRE FX5X1.6XNS BN SS (WIRE) ×2
KIT BASIN OR (CUSTOM PROCEDURE TRAY) ×2 IMPLANT
KIT TURNOVER KIT B (KITS) ×2 IMPLANT
KWIRE FX5X1.6XNS BN SS (WIRE) ×2 IMPLANT
LOOP VESSEL MAXI BLUE (MISCELLANEOUS) IMPLANT
MANIFOLD NEPTUNE II (INSTRUMENTS) ×2 IMPLANT
NEEDLE 22X1 1/2 (OR ONLY) (NEEDLE) IMPLANT
NS IRRIG 1000ML POUR BTL (IV SOLUTION) ×2 IMPLANT
PACK ORTHO EXTREMITY (CUSTOM PROCEDURE TRAY) ×2 IMPLANT
PAD ARMBOARD 7.5X6 YLW CONV (MISCELLANEOUS) ×4 IMPLANT
PAD CAST 3X4 CTTN HI CHSV (CAST SUPPLIES) ×1 IMPLANT
PAD CAST 4YDX4 CTTN HI CHSV (CAST SUPPLIES) ×1 IMPLANT
PADDING CAST COTTON 3X4 STRL (CAST SUPPLIES) ×2
PADDING CAST COTTON 4X4 STRL (CAST SUPPLIES) ×2
PEG LOCKING SMOOTH 2.2X14 (Peg) ×2 IMPLANT
PEG LOCKING SMOOTH 2.2X18 (Peg) ×8 IMPLANT
PEG LOCKING SMOOTH 2.2X20 (Screw) ×2 IMPLANT
PLATE BN MN NAR 41X22 CRSLCK (Plate) ×1 IMPLANT
PLATE CROSSLOCK NAR MINI LT (Plate) ×2 IMPLANT
SCREW LOCK 14X2.7X 3 LD TPR (Screw) ×2 IMPLANT
SCREW LOCKING 2.7X14 (Screw) ×4 IMPLANT
SLING ARM FOAM STRAP SML (SOFTGOODS) IMPLANT
SLING ARM IMMOBILIZER MED (SOFTGOODS) ×2 IMPLANT
SPLINT PLASTER EXTRA FAST 3X15 (CAST SUPPLIES) ×1
SPLINT PLASTER GYPS XFAST 3X15 (CAST SUPPLIES) ×1 IMPLANT
SUT VIC AB 3-0 FS2 27 (SUTURE) ×2 IMPLANT
SYR CONTROL 10ML LL (SYRINGE) IMPLANT
TOWEL GREEN STERILE (TOWEL DISPOSABLE) ×2 IMPLANT
TOWEL GREEN STERILE FF (TOWEL DISPOSABLE) ×2 IMPLANT
TUBE CONNECTING 12X1/4 (SUCTIONS) ×2 IMPLANT

## 2021-03-20 NOTE — Interval H&P Note (Signed)
History and Physical Interval Note:  03/20/2021 1:03 PM  Isabel Barnes  has presented today for surgery, with the diagnosis of LEFT DISTAL RADIUS FRACTURE.  The various methods of treatment have been discussed with the patient and family. After consideration of risks, benefits and other options for treatment, the patient has consented to  Procedure(s): OPEN REDUCTION INTERNAL FIXATION (ORIF) DISTAL RADIAL FRACTURE (Left) as a surgical intervention.  The patient's history has been reviewed, patient examined, no change in status, stable for surgery.  I have reviewed the patient's chart and labs.  Questions were answered to the patient's satisfaction.     Eulas Post

## 2021-03-20 NOTE — Discharge Instructions (Signed)
Diet: As you were doing prior to hospitalization   Shower:  May shower but keep the wounds dry, use an occlusive plastic wrap, NO SOAKING IN TUB.  If the bandage gets wet, change with a clean dry gauze.  If you have a splint on, leave the splint in place and keep the splint dry with a plastic bag.  Dressing:  You may change your dressing 3-5 days after surgery, unless you have a splint.  If you have a splint, then just leave the splint in place and we will change your bandages during your first follow-up appointment.    If you had hand or foot surgery, we will plan to remove your stitches in about 2 weeks in the office.  For all other surgeries, there are sticky tapes (steri-strips) on your wounds and all the stitches are absorbable.  Leave the steri-strips in place when changing your dressings, they will peel off with time, usually 2-3 weeks.  Activity:  Increase activity slowly as tolerated, but follow the weight bearing instructions below.  The rules on driving is that you can not be taking narcotics while you drive, and you must feel in control of the vehicle.    Weight Bearing:  Do not bear weight with right hand. You can use sling for comfort.   To prevent constipation: you may use a stool softener such as -  Colace (over the counter) 100 mg by mouth twice a day  Drink plenty of fluids (prune juice may be helpful) and high fiber foods Miralax (over the counter) for constipation as needed.    Itching:  If you experience itching with your medications, try taking only a single pain pill, or even half a pain pill at a time.  You may take up to 10 pain pills per day, and you can also use benadryl over the counter for itching or also to help with sleep.   Precautions:  If you experience chest pain or shortness of breath - call 911 immediately for transfer to the hospital emergency department!!  If you develop a fever greater that 101 F, purulent drainage from wound, increased redness or  drainage from wound, or calf pain -- Call the office at [phone]                                                Follow- Up Appointment:  Please call for an appointment to be seen in 2 weeks [facility] - [phone]  Pain Medications: Please use your already prescribed Hydrocodone for pain. We have spoken to your Primary Care Provider who will be refilling your hydrocodone, please reach out to their office if you have any questions/concerns or need refills regarding your pain medication.

## 2021-03-20 NOTE — Anesthesia Procedure Notes (Signed)
Procedure Name: MAC Date/Time: 03/20/2021 1:20 PM Performed by: Janace Litten, CRNA Pre-anesthesia Checklist: Patient identified, Emergency Drugs available, Suction available and Patient being monitored Patient Re-evaluated:Patient Re-evaluated prior to induction Oxygen Delivery Method: Nasal cannula

## 2021-03-20 NOTE — Transfer of Care (Signed)
Immediate Anesthesia Transfer of Care Note  Patient: Isabel Barnes  Procedure(s) Performed: OPEN REDUCTION INTERNAL FIXATION (ORIF) DISTAL RADIAL FRACTURE (Left Arm Lower)  Patient Location: PACU  Anesthesia Type:MAC combined with regional for post-op pain  Level of Consciousness: awake and alert   Airway & Oxygen Therapy: Patient Spontanous Breathing  Post-op Assessment: Report given to RN  Post vital signs: Reviewed  Last Vitals:  Vitals Value Taken Time  BP 142/71 03/20/21 1600  Temp 36.5 C 03/20/21 1600  Pulse 80 03/20/21 1601  Resp 16 03/20/21 1601  SpO2 98 % 03/20/21 1601  Vitals shown include unvalidated device data.  Last Pain:  Vitals:   03/20/21 1600  PainSc: 0-No pain      Patients Stated Pain Goal: 3 (95/97/47 1855)  Complications: No complications documented.

## 2021-03-20 NOTE — Op Note (Signed)
03/20/2021  2:34 PM  PATIENT:  [name]    PRE-OPERATIVE DIAGNOSIS:  LEFT DISTAL RADIUS FRACTURE, >3 pieces  POST-OPERATIVE DIAGNOSIS:  Same  PROCEDURE:  ORIF DISTAL RADIUS FRACTURE, >3 PIECES  SURGEON:  Eulas Post, MD  PHYSICIAN ASSISTANT: Janine Ores, PA-C, present and scrubbed throughout the case, critical for completion in a timely fashion, and for retraction, instrumentation, and closure.  ANESTHESIA:   regional  ESTIMATED BLOOD LOSS: minimal  PREOPERATIVE INDICATIONS:  Isabel Barnes is a  80 y.o. female with a diagnosis of LEFT DISTAL RADIUS FRACTURE who elected for surgical management due to fracture displacement.  She had multiple active medical issues that caused delays in surgical intervention and was ultimately optimized and elected for surgery.    The risks benefits and alternatives were discussed with the patient preoperatively including but not limited to the risks of infection, bleeding, nerve injury, cardiopulmonary complications, the need for revision surgery, tendon rupture, hardware prominence, hardware failure, nonunion, malunion, post-traumatic arthritis, regional pain syndrome, among others, and the patient was willing to proceed.  OPERATIVE IMPLANTS: Biomet DVR volar plate with 2 proximal cortical screws and multiple distal interlocking smooth pegs, using the short narrow plate.   OPERATIVE FINDINGS: Comminution of the distal radius fracture with intraarticular split.  UNIQUE ASPECTS OF THE CASE:  The skin was so fragile I the dorsal skin tore during the reduction maneuver.    OPERATIVE PROCEDURE: The patient was brought to the operating room and placed in the supine position. General anesthesia was administered. IV antibiotics were given. Time out was performed. The upper extremity was prepped and draped in usual sterile fashion. The arm was elevated and exsanguinated and the tourniquet was inflated at hg.    Volar approach to the distal  radius was carried out, and the flexor carpi radialis was retracted radially. The radial artery was protected throughout the case.   Deep dissection was carried down, and the pronator quadratus was elevated off of the radius. The fracture site was identified and cleaned and reduced anatomically. This keyed into place nicely.   I held this provisionally with a K wire, and C-arm used to confirm alignment.   I had restored height and inclination and then applied a volar plate. A K wire was used to confirm appropriate position of the plate, and once I was satisfied with the overall alignment I was able to secure the plate proximally with a cortical screw.   I then secured the fracture with multiple smooth interlocking pegs distally, and confirmed that none of these were in the joint, and none of these were penetrating the dorsal cortex. I also secured the plate proximally with one more cortical screw. On the ulnar side of the plate distally I used shorter pegs than on the radial side.    The wounds were irrigated copiously, and I repaired the skin with 3-0 subcutaneous Vicryl and Steri-Strips and sterile gauze and a volar splint. I also placed xeroform over her skin tear on the dorsal side.  The tourniquet was released. She was awakened and returned back in stable and satisfactory condition. There were no complications and She tolerated the procedure well.

## 2021-03-20 NOTE — H&P (Signed)
PREOPERATIVE H&P  Chief Complaint: Left wrist pain  HPI: Isabel Barnes is a 80 y.o. female with history of mild AS, hyperlipidemia, hypertension, chronic venus insufficiency who presents for preoperative history and physical with a diagnosis of left distal radius fracture after a fall. She was scheduled for a left distal radius ORIF with Dr. Everardo Pacific on 03/16/21 and while receiving the nerve block prior to the procedure she began to develop severe chest pains. ECG showed diffuse ST depressions. Surgery was cancelled and she was taken to [facility] Emergency Department. She was admitted to the hospitalist team for chest pain work up. Cardiology team was consulted, stress test was low risk and she was discharged home. Today left wrist symptoms are rated as moderate to severe.  She has elected for surgical management.   Past Medical History:  Diagnosis Date  . Anemia    iron deficiency hx.has had iron infusions before   . Chronic low back pain   . Chronic pain disorder   . Complication of anesthesia    severe claustrophobia  . Constipation    r/t use of pain meds.Takes OTC meds or eats prunes  . Depression   . GERD (gastroesophageal reflux disease)    takes Omeprazole daily  . Heart murmur    mild MS, moderate-severe AS 03/17/21 echo  . History of bronchitis    20+ yrs ago  . History of kidney stones    3 surgerical removed, 1 passed  . History of prolapse of bladder   . History of shingles   . Hypertension    takes Losartan daily  . Hypothyroidism    takes Synthroid daily  . Joint swelling   . Neck pain    bone spurs at base of head per pt  . Osteoarthritis    lumbar,cervical,joints  . Pneumonia    hx of > 20 yrs ago  . Shortness of breath    occasionally and with exertion. Albuterol inhaler as needed  . Spinal headache 1991   blood patch placed  . Spondylitis (HCC)   . Unsteady gait    occasionally  . Urinary urgency    Past Surgical History:  Procedure Laterality Date   . ABDOMINAL HYSTERECTOMY    . ANTERIOR FUSION CERVICAL SPINE     x2 -C4-7  . BUNIONECTOMY Bilateral   . COLONOSCOPY    . CYSTOSCOPY W/ URETEROSCOPY  2012  . EYE SURGERY Bilateral    cataract /lens implant  . HOLMIUM LASER APPLICATION Left 02/08/2013   Procedure: HOLMIUM LASER APPLICATION;  Surgeon: Anner Crete, MD;  Location: [facility];  Service: Urology;  Laterality: Left;  . INSERTION OF MESH N/A 07/15/2014   Procedure: INSERTION OF MESH;  Surgeon: Ardeth Sportsman, MD;  Location: [facility] OR;  Service: General;  Laterality: N/A;  . JOINT REPLACEMENT Right 2012   shoulder  . LAPAROSCOPIC CHOLECYSTECTOMY W/ CHOLANGIOGRAPHY  2012   Dr Magnus Ivan  . NASAL SINUS SURGERY    . RADIOLOGY WITH ANESTHESIA N/A 05/09/2014   Procedure: ADULT SEDATION WITH ANESTHESIA/MRI CERVICAL SPINE WITHOUT CONTRAST;  Surgeon: Medication Radiologist, MD;  Location: [facility] OR;  Service: Radiology;  Laterality: N/A;  DR. HAWKS/MRI  . right knee arthroscopy     d/t meniscal tear  . SHOULDER ARTHROSCOPY W/ ROTATOR CUFF REPAIR Bilateral three times each over several yrs  . THUMB ARTHROSCOPY Left   . 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 UMBILICAL HERNIA REPAIR N/A 07/15/2014   Procedure: LAPAROSCOPIC UMBILICAL AND INFRAUMBILICAL HERNIA;  Surgeon: Ardeth Sportsman, MD;  Location: [facility] OR;  Service: General;  Laterality: N/A;   Social History   Socioeconomic History  . Marital status: Divorced    Spouse name: Not on file  . Number of children: Not on file  . Years of education: Not on file  . Highest education level: Not on file  Occupational History  . Not on file  Tobacco Use  . Smoking status: Former Smoker    Packs/day: 0.50    Years: 2.00    Pack years: 1.00    Types: Cigarettes    Quit date: 02/07/1992    Years since quitting: 29.1  . Smokeless tobacco: Never  Used  Vaping Use  . Vaping Use: Never used  Substance and Sexual Activity  . Alcohol use: No  . Drug use: No  . Sexual activity: Not on file  Other Topics Concern  . Not on file  Social History Narrative  . Not on file   Social Determinants of Health   Financial Resource Strain: Not on file  Food Insecurity: Not on file  Transportation Needs: Not on file  Physical Activity: Not on file  Stress: Not on file  Social Connections: Not on file   Family History  Problem Relation Age of Onset  . Stroke Father    Allergies  Allergen Reactions  . Dilaudid [Hydromorphone Hcl] Shortness Of Breath  . Gabapentin Other (See Comments)    Hoarseness , headache and sore throat  . Latex Rash    Severe rash  . Lyrica [Pregabalin] Other (See Comments)    No balance , had to walk with cane , Blurred vision,weakness.  . Oxycodone Shortness Of Breath and Other (See Comments)    Cannot breathe.  . Singulair [Montelukast Sodium] Shortness Of Breath  . Singulair [Montelukast] Shortness Of Breath and Other (See Comments)    Vision issues, also  . Banana Other (See Comments)    Abdominal cramping   . Ciprofloxacin Hcl Nausea And Vomiting and Other (See Comments)    Nausea and vomiting with by mouth form  . Codeine Other (See Comments)    Hallucinations  . Methadone Nausea And Vomiting and Other (See Comments)    Severe nausea and vomiting  . Metronidazole Nausea And Vomiting and Other (See Comments)    Gastric pain  . Oysters [Shellfish Allergy] Other (See Comments)    "Terrible gastric upset and cramping."  . Cantaloupe (Diagnostic) Other (See Comments)    Abdominal cramping  . Ciprofloxacin Nausea And Vomiting and Other (See Comments)    Nausea and vomiting with by mouth form  . Clindamycin/Lincomycin Diarrhea and Nausea Only  . Donepezil Diarrhea and Other (See Comments)    Severe diarrhea  . Sulfa Antibiotics Diarrhea and Other (See Comments)    GI issues   . Tape Other (See  Comments)    Severe rash  . Zetia [Ezetimibe] Diarrhea  . Elemental Sulfur Nausea And Vomiting  . Iodine Rash  . Other Rash    All Antibiotic ointments/ creams  . Oyster Shell Rash  . Penicillin G Nausea And Vomiting and Rash  . Penicillins Nausea And Vomiting and Rash    Has patient had a PCN reaction causing immediate rash, facial/tongue/throat swelling, SOB or lightheadedness with hypotension: Yes Has patient had a PCN reaction causing severe rash involving mucus membranes  or skin necrosis: No Has patient had a PCN reaction that required hospitalization No Has patient had a PCN reaction occurring within the last 10 years: No If all of the above answers are "NO", then may proceed with Cephalosporin use.   . Povidone Iodine Rash and Other (See Comments)    Oyster shell products- Rash   . Skintegrity Hydrogel [Skin Protectants, Misc.] Rash  . Tapentadol Other (See Comments)    Nightmares   Prior to Admission medications   Medication Sig Start Date End Date Taking? Authorizing Provider  albuterol (VENTOLIN HFA) 108 (90 Base) MCG/ACT inhaler Inhale 2 puffs into the lungs every 4 (four) hours as needed for shortness of breath.     [provider]  aspirin EC 81 MG tablet Take 81 mg by mouth daily.    [provider]  carboxymethylcellulose (REFRESH PLUS) 0.5 % SOLN Place 1 drop into both eyes in the morning, at noon, and at bedtime.    [provider]  cholecalciferol (VITAMIN D) 1000 UNITS tablet Take 1,000 Units by mouth 2 (two) times daily.    [provider]  diclofenac sodium (VOLTAREN) 1 % GEL Apply 2 g topically 2 (two) times daily as needed (for pain).    [provider]  Digestive Enzymes (ENZYME DIGEST PO) Take 1 tablet by mouth daily as needed (as directed).    [provider]  diphenhydrAMINE (BENADRYL) 25 MG tablet Take 50 mg by mouth in the morning, at noon, in the evening, and at bedtime.    [provider]   ENSURE PLUS (ENSURE PLUS) LIQD Take 237 mLs by mouth daily as needed (for supplementation).    [provider]  fish oil-omega-3 fatty acids 1000 MG capsule Take 1,000 mg by mouth 2 (two) times daily.     [provider]  furosemide (LASIX) 20 MG tablet Take 20 mg by mouth daily as needed for fluid.    [provider]  HYDROcodone-acetaminophen (NORCO) 10-325 MG tablet Take 1-2 tablets by mouth every 6 (six) hours as needed for severe pain. Patient taking differently: Take 1 tablet by mouth See admin instructions. Take 1 tablet by mouth four to five times a day 07/16/16   Marshia LyBethune, James, PA-C  levothyroxine (SYNTHROID, LEVOTHROID) 50 MCG tablet Take 50 mcg by mouth every morning.     [provider]  loperamide (IMODIUM A-D) 2 MG tablet Take 4 mg by mouth as needed for diarrhea or loose stools.     [provider]  losartan (COZAAR) 25 MG tablet Take 25 mg by mouth daily.    [provider]  magnesium 30 MG tablet Take 30 mg by mouth daily.     [provider]  methocarbamol (ROBAXIN) 500 MG tablet Take 500 mg by mouth 3 (three) times daily as needed for muscle spasms. 12/03/19   [provider]  Multiple Vitamins-Minerals (MULTIVITAMIN PO) Take 1 tablet by mouth daily.    [provider]  omeprazole (PRILOSEC) 20 MG capsule Take 20 mg by mouth See admin instructions. Take 20 mg by mouth in the morning before breakfast and an additional 20 mg during the day as needed for reflux    [provider]  ondansetron (ZOFRAN) 8 MG tablet Take 1 tablet (8 mg total) by mouth every 8 (eight) hours as needed for nausea or vomiting. 06/25/17   Mancel BaleWentz, Elliott, MD  POTASSIUM PO Take 1 tablet by mouth daily.    [provider]  predniSONE (  DELTASONE) 5 MG tablet Take 5 mg by mouth daily with breakfast. 12/03/19   [provider]  Probiotic Product (PROBIOTIC DAILY PO) Take 1 capsule by mouth daily.     [provider]  prochlorperazine (COMPAZINE) 10 MG tablet Take 1 tablet (10 mg total) by mouth 2 (two) times daily as needed for nausea or vomiting (or headache, may take with benadryl 25mg ). 04/25/20   04/27/20, MD  promethazine (PHENERGAN) 12.5 MG tablet Take 12.5 mg by mouth every 6 (six) hours as needed for nausea or vomiting.    [provider]  RESTASIS 0.05 % ophthalmic emulsion Place 1 drop into both eyes daily. 09/18/19   [provider]  triamcinolone (NASACORT) 55 MCG/ACT AERO nasal inhaler Place 1-2 sprays into the nose 2 (two) times daily as needed (for seasonal allergies). 07/14/17   [provider]  Turmeric 500 MG CAPS Take 500 mg by mouth at bedtime.     [provider]  vitamin B-12 (CYANOCOBALAMIN) 250 MCG tablet Take 250 mcg by mouth at bedtime.     [provider]     Positive ROS: All other systems have been reviewed and were otherwise negative with the exception of those mentioned in the HPI and as above.  Physical Exam: General: Alert, no acute distress Cardiovascular: No pedal edema Respiratory: No cyanosis, no use of accessory musculature GI: No organomegaly, abdomen is soft and non-tender Skin: splint in place Neurologic: Sensation intact distally Psychiatric: Patient is competent for consent with normal mood and affect Lymphatic: No axillary or cervical lymphadenopathy  MUSCULOSKELETAL: LUE in splint. Able to flex and extend all fingers of left hand. Distal sensation and capillary refill intact.   Assessment: Left distal radius fracture   Plan: Plan for Procedure(s): OPEN REDUCTION INTERNAL FIXATION (ORIF) DISTAL RADIAL FRACTURE  The risks benefits and alternatives were discussed with the patient including but not limited to the risks of nonoperative treatment, versus surgical intervention including infection, bleeding, nerve injury,  blood clots, cardiopulmonary complications, morbidity, mortality, among  others, and they were willing to proceed.    Patient's anticipated LOS is less than 2 midnights, meeting these requirements: - Younger than 89 - Lives within 1 hour of care - Has a competent adult at home to recover with post-op recover - NO history of  - Chronic pain requiring opiods  - Diabetes  - Coronary Artery Disease  - Heart failure  - Heart attack  - Stroke  - DVT/VTE  - Cardiac arrhythmia  - Respiratory Failure/COPD  - Renal failure  - Anemia  - Advanced Liver disease   74, PA-C    03/20/2021 8:08 AM

## 2021-03-20 NOTE — Anesthesia Procedure Notes (Addendum)
Anesthesia Regional Block: Supraclavicular block   Pre-Anesthetic Checklist: ,, timeout performed, Correct Patient, Correct Site, Correct Laterality, Correct Procedure, Correct Position, site marked, Risks and benefits discussed,  Surgical consent,  Pre-op evaluation,  At surgeon's request and post-op pain management  Laterality: Left  Prep: chloraprep       Needles:  Injection technique: Single-shot  Needle Type: Echogenic Stimulator Needle     Needle Length: 10cm  Needle Gauge: 20     Additional Needles:   Procedures:,,,, ultrasound used (permanent image in chart),,,,  Narrative:  Start time: 03/20/2021 12:22 PM End time: 03/20/2021 12:27 PM Injection made incrementally with aspirations every 5 mL.  Performed by: Personally  Anesthesiologist: Lucretia Kern, MD  Additional Notes: Standard monitors applied. Skin prepped. Good needle visualization with ultrasound. Injection made in 5cc increments with no resistance to injection. Patient tolerated the procedure well.

## 2021-03-20 NOTE — Anesthesia Postprocedure Evaluation (Signed)
Anesthesia Post Note  Patient: Isabel Barnes  Procedure(s) Performed: OPEN REDUCTION INTERNAL FIXATION (ORIF) DISTAL RADIAL FRACTURE (Left Arm Lower)     Patient location during evaluation: PACU Anesthesia Type: Regional Level of consciousness: awake and alert Pain management: pain level controlled Vital Signs Assessment: post-procedure vital signs reviewed and stable Respiratory status: spontaneous breathing, nonlabored ventilation, respiratory function stable and patient connected to nasal cannula oxygen Cardiovascular status: stable and blood pressure returned to baseline Postop Assessment: no apparent nausea or vomiting Anesthetic complications: no   No complications documented.  Last Vitals:  Vitals:   03/20/21 1539 03/20/21 1600  BP: 132/75 (!) 142/71  Pulse: 73 78  Resp: (!) 22 20  Temp:  36.5 C  SpO2: 97% 97%    Last Pain:  Vitals:   03/20/21 1600  PainSc: 0-No pain                 Kennieth Rad

## 2021-03-23 ENCOUNTER — Encounter (HOSPITAL_COMMUNITY): Payer: Self-pay | Admitting: Orthopedic Surgery

## 2021-03-24 ENCOUNTER — Encounter (HOSPITAL_COMMUNITY): Payer: Self-pay | Admitting: Vascular Surgery

## 2021-03-24 NOTE — Anesthesia Preprocedure Evaluation (Deleted)
Anesthesia Evaluation    Airway        Dental   Pulmonary former smoker,           Cardiovascular hypertension,      Neuro/Psych    GI/Hepatic   Endo/Other    Renal/GU      Musculoskeletal   Abdominal   Peds  Hematology   Anesthesia Other Findings   Reproductive/Obstetrics                             Anesthesia Physical Anesthesia Plan  ASA:   Anesthesia Plan:    Post-op Pain Management:    Induction:   PONV Risk Score and Plan:   Airway Management Planned:   Additional Equipment:   Intra-op Plan:   Post-operative Plan:   Informed Consent:   Plan Discussed with:   Anesthesia Plan Comments: (See PAT notes written by Shonna Chock, PA-C. )        Anesthesia Quick Evaluation

## 2021-03-24 NOTE — Progress Notes (Signed)
Anesthesia Chart Review:  Case: [id number] Date/Time: 03/25/21 1347   Procedure: LUMBAR 1 KYPHOPLASTY (N/A )   Anesthesia type: General   Pre-op diagnosis: SUBACUTE LUMBAR 1 COMPRESSION FRACTURE   [facility] RO[facility] / [facility]   Surgeons: Estill Bamberg, MD      DISCUSSION: See my previous Anesthesia APP note signed 03/19/21. She is s/p ORIF distal radius fracture on 03/20/21, and as previously scheduled is now for L1 kyphoplasty on 03/25/21.   Currently, last COVID-19 test and labs are from 03/16/21.   She is a same day work-up, but with recent orthopedic surgery and prior to that overnight hospitalization with cardiac work-up as outlined in my previous noted. Anesthesia team to evaluate on the day of surgery.   Shonna Chock, PA-C Surgical Short Stay/Anesthesiology [facility] Phone [phone] [facility] Phone [phone] 03/24/2021 12:37 PM

## 2021-03-25 ENCOUNTER — Encounter (HOSPITAL_COMMUNITY): Admission: RE | Payer: Self-pay | Source: Home / Self Care

## 2021-03-25 ENCOUNTER — Ambulatory Visit (HOSPITAL_COMMUNITY): Admission: RE | Admit: 2021-03-25 | Payer: Medicare Other | Source: Home / Self Care | Admitting: Orthopedic Surgery

## 2021-03-25 SURGERY — KYPHOPLASTY
Anesthesia: General

## 2021-04-09 ENCOUNTER — Ambulatory Visit (HOSPITAL_COMMUNITY)
Admission: EM | Admit: 2021-04-09 | Discharge: 2021-04-09 | Disposition: A | Discharge status: Home / Self Care | Payer: Medicare Other

## 2021-04-09 NOTE — ED Notes (Signed)
Martyn Ehrich, patient access administrator reported patient left

## 2022-02-01 ENCOUNTER — Telehealth: Payer: Self-pay | Admitting: Cardiovascular Disease

## 2022-02-01 NOTE — Telephone Encounter (Signed)
Spoke with patient of Dr. Kirke Corin in regards to her legs. She reports worsening symptoms with legs since December. She reports legs get better and then worse. Right now is OK. She reports blisters are OK, swelling isn't as bad as usual. She reports she is in pain with her legs. She reports hydrocodone does not let with her legs. She reports she is a "terrible fall risk" right.  ? ?Advised we should get her in sooner than 4/25 with Dr. Kirke Corin - scheduled for 3/16 @ 3:35pm with Wynema Birch PA (PV slot) and advised we will keep April visit with Kirke Corin MD also  ? ?Routed to [employer] and Dr. Kirke Corin as Lorain Childes ?

## 2022-02-01 NOTE — Telephone Encounter (Signed)
Called patient to schedule overdue appt with Dr. Kirke Corin per staff message from [place], [place]. While speaking with the patient she kept saying she was not doing good. I asked her to explain further, she stated that she is having a lot of trouble with her venous insufficiency. She states that her legs look awful and there is lots of drainage. She says that she has most of the blisters under control but would like further advisement. She is scheduled for 03/23/22 with Dr. Kirke Corin as she was needing a later in the day appointment.   ?

## 2022-02-02 NOTE — Telephone Encounter (Signed)
Spoke with the patient. Isabel Barnes would like to stay with Dr. Fletcher Anon as a patient. Appointments have been scheduled. Isabel Barnes has been advised to call back sooner if anything is needed. ?

## 2022-02-06 ENCOUNTER — Emergency Department (HOSPITAL_COMMUNITY): Payer: Medicare Other

## 2022-02-06 ENCOUNTER — Other Ambulatory Visit: Payer: Self-pay

## 2022-02-06 ENCOUNTER — Encounter (HOSPITAL_COMMUNITY): Payer: Self-pay

## 2022-02-06 ENCOUNTER — Inpatient Hospital Stay (HOSPITAL_COMMUNITY)
Admission: EM | Admit: 2022-02-06 | Discharge: 2022-02-22 | DRG: 573 | Disposition: A | Discharge status: Skilled Nursing Facility | Payer: Medicare Other | Attending: Internal Medicine | Admitting: Internal Medicine

## 2022-02-06 DIAGNOSIS — R54 Age-related physical debility: Secondary | ICD-10-CM | POA: Diagnosis present

## 2022-02-06 DIAGNOSIS — K219 Gastro-esophageal reflux disease without esophagitis: Secondary | ICD-10-CM | POA: Diagnosis present

## 2022-02-06 DIAGNOSIS — Z8673 Personal history of transient ischemic attack (TIA), and cerebral infarction without residual deficits: Secondary | ICD-10-CM

## 2022-02-06 DIAGNOSIS — Y92009 Unspecified place in unspecified non-institutional (private) residence as the place of occurrence of the external cause: Secondary | ICD-10-CM

## 2022-02-06 DIAGNOSIS — Z90710 Acquired absence of both cervix and uterus: Secondary | ICD-10-CM

## 2022-02-06 DIAGNOSIS — K5900 Constipation, unspecified: Secondary | ICD-10-CM | POA: Diagnosis not present

## 2022-02-06 DIAGNOSIS — E039 Hypothyroidism, unspecified: Secondary | ICD-10-CM | POA: Diagnosis present

## 2022-02-06 DIAGNOSIS — Z7189 Other specified counseling: Secondary | ICD-10-CM | POA: Diagnosis not present

## 2022-02-06 DIAGNOSIS — I771 Stricture of artery: Secondary | ICD-10-CM | POA: Diagnosis present

## 2022-02-06 DIAGNOSIS — Z515 Encounter for palliative care: Secondary | ICD-10-CM

## 2022-02-06 DIAGNOSIS — D75839 Thrombocytosis, unspecified: Secondary | ICD-10-CM | POA: Diagnosis present

## 2022-02-06 DIAGNOSIS — L97219 Non-pressure chronic ulcer of right calf with unspecified severity: Secondary | ICD-10-CM | POA: Diagnosis present

## 2022-02-06 DIAGNOSIS — S81801A Unspecified open wound, right lower leg, initial encounter: Secondary | ICD-10-CM | POA: Diagnosis not present

## 2022-02-06 DIAGNOSIS — Z9181 History of falling: Secondary | ICD-10-CM

## 2022-02-06 DIAGNOSIS — I4891 Unspecified atrial fibrillation: Secondary | ICD-10-CM | POA: Diagnosis not present

## 2022-02-06 DIAGNOSIS — D649 Anemia, unspecified: Secondary | ICD-10-CM | POA: Diagnosis not present

## 2022-02-06 DIAGNOSIS — B370 Candidal stomatitis: Secondary | ICD-10-CM | POA: Diagnosis not present

## 2022-02-06 DIAGNOSIS — Z6820 Body mass index (BMI) 20.0-20.9, adult: Secondary | ICD-10-CM

## 2022-02-06 DIAGNOSIS — R579 Shock, unspecified: Secondary | ICD-10-CM | POA: Diagnosis not present

## 2022-02-06 DIAGNOSIS — D508 Other iron deficiency anemias: Secondary | ICD-10-CM | POA: Diagnosis not present

## 2022-02-06 DIAGNOSIS — I959 Hypotension, unspecified: Secondary | ICD-10-CM | POA: Diagnosis present

## 2022-02-06 DIAGNOSIS — I9789 Other postprocedural complications and disorders of the circulatory system, not elsewhere classified: Secondary | ICD-10-CM | POA: Diagnosis not present

## 2022-02-06 DIAGNOSIS — Z885 Allergy status to narcotic agent status: Secondary | ICD-10-CM

## 2022-02-06 DIAGNOSIS — F40240 Claustrophobia: Secondary | ICD-10-CM | POA: Diagnosis present

## 2022-02-06 DIAGNOSIS — Z880 Allergy status to penicillin: Secondary | ICD-10-CM

## 2022-02-06 DIAGNOSIS — S8011XA Contusion of right lower leg, initial encounter: Secondary | ICD-10-CM | POA: Diagnosis present

## 2022-02-06 DIAGNOSIS — Z87891 Personal history of nicotine dependence: Secondary | ICD-10-CM

## 2022-02-06 DIAGNOSIS — I739 Peripheral vascular disease, unspecified: Secondary | ICD-10-CM | POA: Diagnosis not present

## 2022-02-06 DIAGNOSIS — D638 Anemia in other chronic diseases classified elsewhere: Secondary | ICD-10-CM | POA: Diagnosis present

## 2022-02-06 DIAGNOSIS — Z634 Disappearance and death of family member: Secondary | ICD-10-CM

## 2022-02-06 DIAGNOSIS — M459 Ankylosing spondylitis of unspecified sites in spine: Secondary | ICD-10-CM | POA: Diagnosis present

## 2022-02-06 DIAGNOSIS — M7989 Other specified soft tissue disorders: Secondary | ICD-10-CM | POA: Diagnosis not present

## 2022-02-06 DIAGNOSIS — I10 Essential (primary) hypertension: Secondary | ICD-10-CM | POA: Diagnosis present

## 2022-02-06 DIAGNOSIS — F32A Depression, unspecified: Secondary | ICD-10-CM | POA: Diagnosis present

## 2022-02-06 DIAGNOSIS — I480 Paroxysmal atrial fibrillation: Secondary | ICD-10-CM | POA: Diagnosis present

## 2022-02-06 DIAGNOSIS — W07XXXA Fall from chair, initial encounter: Secondary | ICD-10-CM | POA: Diagnosis present

## 2022-02-06 DIAGNOSIS — M79604 Pain in right leg: Secondary | ICD-10-CM | POA: Diagnosis not present

## 2022-02-06 DIAGNOSIS — Z7982 Long term (current) use of aspirin: Secondary | ICD-10-CM

## 2022-02-06 DIAGNOSIS — L03115 Cellulitis of right lower limb: Secondary | ICD-10-CM | POA: Diagnosis not present

## 2022-02-06 DIAGNOSIS — M160 Bilateral primary osteoarthritis of hip: Secondary | ICD-10-CM | POA: Diagnosis present

## 2022-02-06 DIAGNOSIS — L02419 Cutaneous abscess of limb, unspecified: Secondary | ICD-10-CM | POA: Diagnosis not present

## 2022-02-06 DIAGNOSIS — Z8701 Personal history of pneumonia (recurrent): Secondary | ICD-10-CM

## 2022-02-06 DIAGNOSIS — M545 Low back pain, unspecified: Secondary | ICD-10-CM | POA: Diagnosis present

## 2022-02-06 DIAGNOSIS — Z79899 Other long term (current) drug therapy: Secondary | ICD-10-CM

## 2022-02-06 DIAGNOSIS — Z8619 Personal history of other infectious and parasitic diseases: Secondary | ICD-10-CM

## 2022-02-06 DIAGNOSIS — Z91041 Radiographic dye allergy status: Secondary | ICD-10-CM

## 2022-02-06 DIAGNOSIS — L02415 Cutaneous abscess of right lower limb: Secondary | ICD-10-CM | POA: Diagnosis present

## 2022-02-06 DIAGNOSIS — I890 Lymphedema, not elsewhere classified: Secondary | ICD-10-CM | POA: Diagnosis present

## 2022-02-06 DIAGNOSIS — R791 Abnormal coagulation profile: Secondary | ICD-10-CM | POA: Diagnosis present

## 2022-02-06 DIAGNOSIS — E43 Unspecified severe protein-calorie malnutrition: Secondary | ICD-10-CM | POA: Diagnosis not present

## 2022-02-06 DIAGNOSIS — Z882 Allergy status to sulfonamides status: Secondary | ICD-10-CM

## 2022-02-06 DIAGNOSIS — R0982 Postnasal drip: Secondary | ICD-10-CM | POA: Diagnosis present

## 2022-02-06 DIAGNOSIS — Z888 Allergy status to other drugs, medicaments and biological substances status: Secondary | ICD-10-CM

## 2022-02-06 DIAGNOSIS — L538 Other specified erythematous conditions: Secondary | ICD-10-CM | POA: Diagnosis not present

## 2022-02-06 DIAGNOSIS — I872 Venous insufficiency (chronic) (peripheral): Secondary | ICD-10-CM | POA: Diagnosis present

## 2022-02-06 DIAGNOSIS — D509 Iron deficiency anemia, unspecified: Secondary | ICD-10-CM | POA: Diagnosis present

## 2022-02-06 DIAGNOSIS — Z20822 Contact with and (suspected) exposure to covid-19: Secondary | ICD-10-CM | POA: Diagnosis present

## 2022-02-06 DIAGNOSIS — M47812 Spondylosis without myelopathy or radiculopathy, cervical region: Secondary | ICD-10-CM | POA: Diagnosis present

## 2022-02-06 DIAGNOSIS — Z66 Do not resuscitate: Secondary | ICD-10-CM | POA: Diagnosis present

## 2022-02-06 DIAGNOSIS — J90 Pleural effusion, not elsewhere classified: Secondary | ICD-10-CM | POA: Diagnosis present

## 2022-02-06 DIAGNOSIS — Z823 Family history of stroke: Secondary | ICD-10-CM

## 2022-02-06 DIAGNOSIS — I272 Pulmonary hypertension, unspecified: Secondary | ICD-10-CM | POA: Diagnosis not present

## 2022-02-06 DIAGNOSIS — Z881 Allergy status to other antibiotic agents status: Secondary | ICD-10-CM

## 2022-02-06 DIAGNOSIS — R600 Localized edema: Secondary | ICD-10-CM | POA: Diagnosis present

## 2022-02-06 DIAGNOSIS — W19XXXA Unspecified fall, initial encounter: Secondary | ICD-10-CM | POA: Diagnosis not present

## 2022-02-06 DIAGNOSIS — G8929 Other chronic pain: Secondary | ICD-10-CM | POA: Diagnosis present

## 2022-02-06 DIAGNOSIS — I350 Nonrheumatic aortic (valve) stenosis: Secondary | ICD-10-CM | POA: Diagnosis present

## 2022-02-06 DIAGNOSIS — Z9109 Other allergy status, other than to drugs and biological substances: Secondary | ICD-10-CM

## 2022-02-06 DIAGNOSIS — Z87442 Personal history of urinary calculi: Secondary | ICD-10-CM

## 2022-02-06 DIAGNOSIS — K921 Melena: Secondary | ICD-10-CM | POA: Diagnosis present

## 2022-02-06 DIAGNOSIS — M25551 Pain in right hip: Secondary | ICD-10-CM | POA: Diagnosis present

## 2022-02-06 DIAGNOSIS — B952 Enterococcus as the cause of diseases classified elsewhere: Secondary | ICD-10-CM | POA: Diagnosis present

## 2022-02-06 DIAGNOSIS — L03119 Cellulitis of unspecified part of limb: Secondary | ICD-10-CM

## 2022-02-06 DIAGNOSIS — R0981 Nasal congestion: Secondary | ICD-10-CM | POA: Diagnosis present

## 2022-02-06 DIAGNOSIS — Z91013 Allergy to seafood: Secondary | ICD-10-CM

## 2022-02-06 DIAGNOSIS — M47816 Spondylosis without myelopathy or radiculopathy, lumbar region: Secondary | ICD-10-CM | POA: Diagnosis present

## 2022-02-06 DIAGNOSIS — Z96651 Presence of right artificial knee joint: Secondary | ICD-10-CM | POA: Diagnosis present

## 2022-02-06 DIAGNOSIS — Z91040 Latex allergy status: Secondary | ICD-10-CM

## 2022-02-06 LAB — CBC WITH DIFFERENTIAL/PLATELET
Abs Immature Granulocytes: 0.09 10*3/uL — ABNORMAL HIGH (ref 0.00–0.07)
Basophils Absolute: 0.1 10*3/uL (ref 0.0–0.1)
Basophils Relative: 0 %
Eosinophils Absolute: 0.1 10*3/uL (ref 0.0–0.5)
Eosinophils Relative: 0 %
HCT: 26.5 % — ABNORMAL LOW (ref 36.0–46.0)
Hemoglobin: 8.5 g/dL — ABNORMAL LOW (ref 12.0–15.0)
Immature Granulocytes: 1 %
Lymphocytes Relative: 4 %
Lymphs Abs: 0.7 10*3/uL (ref 0.7–4.0)
MCH: 26.1 pg (ref 26.0–34.0)
MCHC: 32.1 g/dL (ref 30.0–36.0)
MCV: 81.3 fL (ref 80.0–100.0)
Monocytes Absolute: 1.1 10*3/uL — ABNORMAL HIGH (ref 0.1–1.0)
Monocytes Relative: 7 %
Neutro Abs: 14.2 10*3/uL — ABNORMAL HIGH (ref 1.7–7.7)
Neutrophils Relative %: 88 %
Platelets: 478 10*3/uL — ABNORMAL HIGH (ref 150–400)
RBC: 3.26 MIL/uL — ABNORMAL LOW (ref 3.87–5.11)
RDW: 14.9 % (ref 11.5–15.5)
WBC: 16.3 10*3/uL — ABNORMAL HIGH (ref 4.0–10.5)
nRBC: 0 % (ref 0.0–0.2)

## 2022-02-06 LAB — COMPREHENSIVE METABOLIC PANEL
ALT: 20 U/L (ref 0–44)
AST: 26 U/L (ref 15–41)
Albumin: 2.1 g/dL — ABNORMAL LOW (ref 3.5–5.0)
Alkaline Phosphatase: 79 U/L (ref 38–126)
Anion gap: 12 (ref 5–15)
BUN: 24 mg/dL — ABNORMAL HIGH (ref 8–23)
CO2: 22 mmol/L (ref 22–32)
Calcium: 8.3 mg/dL — ABNORMAL LOW (ref 8.9–10.3)
Chloride: 98 mmol/L (ref 98–111)
Creatinine, Ser: 0.68 mg/dL (ref 0.44–1.00)
GFR, Estimated: >60 mL/min (ref >60)
Glucose, Bld: 103 mg/dL — ABNORMAL HIGH (ref 70–99)
Potassium: 3.8 mmol/L (ref 3.5–5.1)
Sodium: 132 mmol/L — ABNORMAL LOW (ref 135–145)
Total Bilirubin: 0.6 mg/dL (ref 0.3–1.2)
Total Protein: 5.3 g/dL — ABNORMAL LOW (ref 6.5–8.1)

## 2022-02-06 LAB — LACTIC ACID, PLASMA
Lactic Acid, Venous: 1.5 mmol/L (ref 0.5–1.9)
Lactic Acid, Venous: 1.9 mmol/L (ref 0.5–1.9)

## 2022-02-06 LAB — RESP PANEL BY RT-PCR (FLU A&B, COVID) ARPGX2
Influenza A by PCR: NEGATIVE
Influenza B by PCR: NEGATIVE
SARS Coronavirus 2 by RT PCR: NEGATIVE

## 2022-02-06 LAB — TSH: TSH: 4.449 u[IU]/mL (ref 0.350–4.500)

## 2022-02-06 LAB — PREPARE RBC (CROSSMATCH)

## 2022-02-06 LAB — POC OCCULT BLOOD, ED: Fecal Occult Bld: NEGATIVE

## 2022-02-06 MED ORDER — HYDROCODONE-ACETAMINOPHEN 10-325 MG PO TABS
1.0000 | ORAL_TABLET | Freq: Four times a day (QID) | ORAL | Status: DC | PRN
  Administered 2022-02-06 – 2022-02-07 (×2): 1 via ORAL
  Filled 2022-02-06 (×2): qty 1

## 2022-02-06 MED ORDER — AMIODARONE HCL IN DEXTROSE 360-4.14 MG/200ML-% IV SOLN
60.0000 mg/h | INTRAVENOUS | Status: AC
  Administered 2022-02-06: 60 mg/h via INTRAVENOUS
  Filled 2022-02-06 (×2): qty 200

## 2022-02-06 MED ORDER — PANTOPRAZOLE SODIUM 40 MG PO TBEC
40.0000 mg | DELAYED_RELEASE_TABLET | Freq: Every day | ORAL | Status: DC
  Administered 2022-02-06 – 2022-02-09 (×4): 40 mg via ORAL
  Filled 2022-02-06 (×4): qty 1

## 2022-02-06 MED ORDER — METHOCARBAMOL 500 MG PO TABS
500.0000 mg | ORAL_TABLET | Freq: Three times a day (TID) | ORAL | Status: DC | PRN
  Administered 2022-02-07 – 2022-02-17 (×9): 500 mg via ORAL
  Filled 2022-02-06 (×9): qty 1

## 2022-02-06 MED ORDER — ROSUVASTATIN CALCIUM 5 MG PO TABS
5.0000 mg | ORAL_TABLET | Freq: Every day | ORAL | Status: DC
  Administered 2022-02-06 – 2022-02-22 (×17): 5 mg via ORAL
  Filled 2022-02-06 (×17): qty 1

## 2022-02-06 MED ORDER — FENTANYL CITRATE PF 50 MCG/ML IJ SOSY
100.0000 ug | PREFILLED_SYRINGE | Freq: Once | INTRAMUSCULAR | Status: AC
  Administered 2022-02-06: 100 ug via INTRAVENOUS
  Filled 2022-02-06: qty 2

## 2022-02-06 MED ORDER — AMIODARONE LOAD VIA INFUSION
150.0000 mg | Freq: Once | INTRAVENOUS | Status: AC
  Administered 2022-02-06: 150 mg via INTRAVENOUS

## 2022-02-06 MED ORDER — SODIUM CHLORIDE 0.9 % IV SOLN
2.0000 g | Freq: Once | INTRAVENOUS | Status: AC
  Administered 2022-02-06: 2 g via INTRAVENOUS
  Filled 2022-02-06: qty 20

## 2022-02-06 MED ORDER — SODIUM CHLORIDE 0.9 % IV BOLUS
500.0000 mL | Freq: Once | INTRAVENOUS | Status: AC
  Administered 2022-02-06: 500 mL via INTRAVENOUS

## 2022-02-06 MED ORDER — LACTATED RINGERS IV BOLUS
1000.0000 mL | Freq: Once | INTRAVENOUS | Status: AC
  Administered 2022-02-06: 1000 mL via INTRAVENOUS

## 2022-02-06 MED ORDER — SODIUM CHLORIDE 0.9 % IV SOLN
2.0000 g | INTRAVENOUS | Status: DC
  Administered 2022-02-07 – 2022-02-14 (×8): 2 g via INTRAVENOUS
  Filled 2022-02-06 (×8): qty 20

## 2022-02-06 MED ORDER — PREDNISONE 5 MG PO TABS
5.0000 mg | ORAL_TABLET | Freq: Every day | ORAL | Status: DC
Start: 2022-02-07 — End: 2022-02-22
  Administered 2022-02-08 – 2022-02-22 (×13): 5 mg via ORAL
  Filled 2022-02-06 (×13): qty 1

## 2022-02-06 MED ORDER — AMIODARONE HCL IN DEXTROSE 360-4.14 MG/200ML-% IV SOLN
30.0000 mg/h | INTRAVENOUS | Status: DC
  Filled 2022-02-06: qty 200

## 2022-02-06 MED ORDER — DILTIAZEM HCL-DEXTROSE 125-5 MG/125ML-% IV SOLN (PREMIX)
5.0000 mg/h | INTRAVENOUS | Status: DC
  Administered 2022-02-06: 5 mg/h via INTRAVENOUS
  Filled 2022-02-06: qty 125

## 2022-02-06 MED ORDER — ADENOSINE 6 MG/2ML IV SOLN
6.0000 mg | Freq: Once | INTRAVENOUS | Status: AC
  Administered 2022-02-06: 6 mg via INTRAVENOUS
  Filled 2022-02-06: qty 2

## 2022-02-06 MED ORDER — SODIUM CHLORIDE 0.9% IV SOLUTION: Freq: Once | INTRAVENOUS | Status: DC

## 2022-02-06 MED ORDER — CYCLOSPORINE 0.05 % OP EMUL
1.0000 [drp] | Freq: Every day | OPHTHALMIC | Status: DC
  Administered 2022-02-07 – 2022-02-22 (×13): 1 [drp] via OPHTHALMIC
  Filled 2022-02-06 (×18): qty 30

## 2022-02-06 NOTE — ED Notes (Signed)
Attempted to draw blood cultures and labs x2, unable to get blood return.  ?

## 2022-02-06 NOTE — ED Notes (Signed)
Son Elzie Rings [phone] wants an update asap ?

## 2022-02-06 NOTE — H&P (Signed)
Date: 02/06/2022               Patient Name:  Isabel Barnes MRN: [id number]  DOB: 04/17/1941 Age / Sex: 81 y.o., female   PCP: Sheral Apley         Medical Service: Internal Medicine Teaching Service         Attending Physician: Dr. Velna Ochs, MD    First Contact: Dr. Raymondo Band, DO Pager: [phone]  Second Contact: Dr. Coy Saunas, MD Pager: [phone]       After Hours (After 5p/  First Contact Pager: [phone]  weekends / holidays): Second Contact Pager: [phone]   Chief Complaint: Fall  History of Present Illness: Patient is a 81 year old lady with a history of cerebellar stroke, iron deficiency anemia, GERD, HTN, hypothyroidism, aortic stenosis, ankylosing spondylitis, venous insufficiency and osteoarthritis who presented to the ED after a fall. Patient reports that since December she has had 4-5 falls at home from losing her balance. Does not think she hit her head during the falls, she usually tries to brace herself. Last night, patient states she felt tired and weak all over. This morning, patient states she was trying to pay her bills when she slipped out of her chair and fell on her right hip.  She endorses hitting her head on the way down and complains of some headache.  She also endorse palpitations, generalized weakness and occasional lightheadedness but denies any blurry vision, shortness of breath, chest pain, abdominal pain, nausea, vomiting. She reports significant weight loss over the past year. Over the last month, she has had 2 episodes of black tarry stools but normal stools in the past week.  Patient also reports chronic lower extremity wounds from her venous insufficiency that is being managed by her PCP. The wounds on her right lower extremity has gotten worse. Patient on Norco for chronic back and leg pains.   Meds:  Current Meds  Medication Sig   albuterol (VENTOLIN HFA) 108 (90 Base) MCG/ACT inhaler Inhale 2 puffs into the lungs every 4 (four) hours  as needed for shortness of breath.    aspirin EC 81 MG tablet Take 81 mg by mouth daily.   carboxymethylcellulose (REFRESH PLUS) 0.5 % SOLN Place 1 drop into both eyes in the morning, at noon, and at bedtime.   cholecalciferol (VITAMIN D) 1000 UNITS tablet Take 1,000 Units by mouth 2 (two) times daily.   diclofenac sodium (VOLTAREN) 1 % GEL Apply 2 g topically 2 (two) times daily as needed (for pain).   Digestive Enzymes (ENZYME DIGEST PO) Take 1 tablet by mouth daily as needed (For digestive).   diphenhydrAMINE (BENADRYL) 25 MG tablet Take 25 mg by mouth every 6 (six) hours as needed for allergies.   ENSURE PLUS (ENSURE PLUS) LIQD Take 237 mLs by mouth daily as needed (for supplementation).   fish oil-omega-3 fatty acids 1000 MG capsule Take 1,000 mg by mouth 2 (two) times daily.    furosemide (LASIX) 20 MG tablet Take 20 mg by mouth daily as needed for fluid.   HYDROcodone-acetaminophen (NORCO) 10-325 MG tablet Take 1-2 tablets by mouth every 6 (six) hours as needed for severe pain. (Patient taking differently: Take 1 tablet by mouth every 6 (six) hours as needed for moderate pain.)   loperamide (IMODIUM A-D) 2 MG tablet Take 4 mg by mouth 3 (three) times daily as needed for diarrhea or loose stools.   losartan (COZAAR) 25 MG tablet Take 25 mg by mouth  daily.   magnesium 30 MG tablet Take 30 mg by mouth daily.    methocarbamol (ROBAXIN) 500 MG tablet Take 500 mg by mouth 3 (three) times daily as needed for muscle spasms.   Multiple Vitamins-Minerals (MULTIVITAMIN PO) Take 1 tablet by mouth daily.   ondansetron (ZOFRAN) 8 MG tablet Take 1 tablet (8 mg total) by mouth every 8 (eight) hours as needed for nausea or vomiting.   POTASSIUM PO Take 1 tablet by mouth daily.   predniSONE (DELTASONE) 5 MG tablet Take 5 mg by mouth daily with breakfast.   Probiotic Product (PROBIOTIC DAILY PO) Take 1 capsule by mouth daily.    RESTASIS 0.05 % ophthalmic emulsion Place 1 drop into both eyes daily.    rosuvastatin (CRESTOR) 5 MG tablet Take 5 mg by mouth daily.   triamcinolone (NASACORT) 55 MCG/ACT AERO nasal inhaler Place 1-2 sprays into the nose 2 (two) times daily as needed (for seasonal allergies).   Turmeric 500 MG CAPS Take 500 mg by mouth at bedtime.    vitamin B-12 (CYANOCOBALAMIN) 250 MCG tablet Take 250 mcg by mouth at bedtime.      Allergies: Allergies as of 02/06/2022 - Review Complete 02/06/2022  Allergen Reaction Noted   Dilaudid [hydromorphone hcl] Shortness Of Breath 09/19/2012   Gabapentin Other (See Comments) 06/05/2013   Latex Rash 09/19/2012   Lyrica [pregabalin] Other (See Comments) 06/05/2013   Oxycodone Shortness Of Breath and Other (See Comments) 02/17/2016   Singulair [montelukast] Shortness Of Breath and Other (See Comments) 09/07/2020   Ciprofloxacin hcl Nausea And Vomiting and Other (See Comments) 09/19/2012   Codeine Other (See Comments) 09/19/2012   Methadone Nausea And Vomiting and Other (See Comments) 09/19/2012   Metronidazole Nausea And Vomiting and Other (See Comments) 09/19/2012   Oysters [shellfish allergy] Other (See Comments) 09/19/2012   Clindamycin/lincomycin Diarrhea and Nausea Only 07/01/2016   Donepezil Diarrhea and Other (See Comments) 03/16/2021   Sulfa antibiotics Diarrhea and Other (See Comments) 09/04/2014   Tape Other (See Comments) 03/16/2021   Zetia [ezetimibe] Diarrhea 03/16/2021   Elemental sulfur Nausea And Vomiting 09/19/2012   Iodine Rash 09/07/2020   Other Rash 07/15/2014   Oyster shell Rash 03/16/2021   Penicillin g Nausea And Vomiting and Rash 09/07/2020   Penicillins Nausea And Vomiting and Rash 09/19/2012   Povidone iodine Rash and Other (See Comments) 09/19/2012   Skintegrity hydrogel [skin protectants, misc.] Rash 09/07/2020   Tapentadol Other (See Comments) 02/06/2013   Past Medical History:  Diagnosis Date   Anemia    iron deficiency hx.has had iron infusions before    Chronic low back pain    Chronic pain  disorder    Complication of anesthesia    severe claustrophobia   Constipation    r/t use of pain meds.Takes OTC meds or eats prunes   Depression    GERD (gastroesophageal reflux disease)    takes Omeprazole daily   Heart murmur    mild MS, moderate-severe AS 03/17/21 echo   History of bronchitis    20+ yrs ago   History of kidney stones    3 surgerical removed, 1 passed   History of prolapse of bladder    History of shingles    Hypertension    takes Losartan daily   Hypothyroidism    takes Synthroid daily   Joint swelling    Neck pain    bone spurs at base of head per pt   Osteoarthritis    lumbar,cervical,joints   Pneumonia  hx of > 20 yrs ago   Shortness of breath    occasionally and with exertion. 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[place])    Unsteady gait    occasionally   Urinary urgency    PCP: Hedgecock, Claiborne Billings, PA-C  Family History: Significant for brother who had prostate cancer and osteoarthritis in both parents.  History of Alzheimer's in mother, brother and sister.   Social History: States she lives by herself. Husband passed away 2 years ago. Has 2 children and 3 grandchildren. She ambulates with a walker and occasionally a cane. She denies any EtOH use, tobacco use or illicit drug use.  Review of Systems: A complete ROS was negative except as per HPI.   Physical Exam: Blood pressure (!) 99/59, pulse 93, temperature 97.8 F (36.6 C), temperature source Oral, resp. rate 20, height 5' (1.524 m), weight 49.4 kg, SpO2 96 %.  General: Pleasant, chronically ill and drowsy elderly female laying in bed. NAD.  Head: Normocephalic. Atraumatic. CV: Tachycardic. Systolic murmur.  No rubs or gallops. 1-2+ BLE edema Pulmonary: Lungs CTAB. Normal effort. No wheezing or rales. Abdominal: Soft, nontender, nondistended. Normal bowel sounds. Extremities: Palpable radial pulse, dopplerable DP pulses.  BLE edema,  R>L. 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 RLE with healing eschar on the calf and is moderately tender to palpation.  Multiple joint deformities and OA nodules on PIPs. Skin: Warm and dry.  Neuro: Somnolent but answers question appropriately.  Oriented to person, place time and self. Moves all extremities. Normal sensation to gross touch. Psych: Normal mood and affect  EKG: personally reviewed my interpretation is A-fib with RVR and a rate of 155  CXR: personally reviewed my interpretation is no cardiopulmonary disease.  Assessment & Plan by Problem: Principal Problem:   Cellulitis of right lower extremity  Patient is a 81 year old lady with a history of cerebellar stroke, iron deficiency anemia, GERD, HTN, hypothyroidism, aortic stenosis, ankylosing spondylitis, venous insufficiency and osteoarthritis who presented to the ED after a fall and found to be in A-fib likely secondary to acute infection/cellulitis of the lower extremity.  #Fall Patient with a history of ankylosing spondylitis and remote cerebellar stroke that was noticed on CT of the head today presented after mechanical fall.  CT head negative for acute intracranial abnormalities.  Hip x-ray negative for acute fracture but showed mild to moderate degenerative changes. Patient reports difficulty with balance the last few months leading to multiple falls since December.  For less likely a syncope episode due to patient falling from a sitting position.  Pending CTA to rule out PE. --Fall precautions -- Pain control with home Norco  #Sepsis rule out #Cellulitis Patient with a history of venous insufficiency sustained a right lower leg laceration back in October 2022.  Patient saw her PCP 4 days and plan was to continue self-care with plan to follow-up with wound center if wound did not heal. Patient presented today with worsening right lower extremity wound with white count of 16.3 and hypotension.  Lactic acid within normal limits. Remains afebrile but tachycardic on admission.  Mental status at baseline. On exam, patient has nonpurulent cellulitis to the right lower extremity that is tender to touch. We will start on broad-spectrum antibiotics and follow-up blood cultures. Patient does not have significant risk factors for MRSA infection. --IV fluid resuscitation --Start IV ceftriaxone --Follow-up blood culture --Trend fever curve, WBC --Wound care consult  #  New A-fib #Moderate to severe AS Patient with a history of moderate to severe aortic stenosis on a recent echo found to have heart rate in the 140s to 150s on admission. EKG shows narrow complex tachycardia that was not responsive to adenosine in the ER. Started on Dilt drip but unable to tolerate due to hypotension.  Cardiology consulted and patient started on amiodarone drip.  TSH within normal limits. --Cardiology following, appreciate recs --Continue amnio drip --Daily BMP, electrolytes --Telemetry  #Normocytic anemia #Iron deficiency anemia Patient with IDA found to have hemoglobin of 8.5 with hypotension on admission. Patient reports 2 episodes of black tarry stools in the past month but none in the past week. She endorse occasional dizziness and loss of balance.  Denies any recent bloody stools.  Last iron studies normal 6 years ago.  She believes she has had a colonoscopy in the last 10 years but not recently.  Drop in hemoglobin likely secondary to chronic blood loss versus worsening iron deficiency anemia. --PRBC 1 unit, f/u posttransfusion CBC --IV fluid as above --Iron studies, CBC --Patient will need colonoscopy outpatient  #Ankylosing spondylitis #Chronic back pain #Osteoarthritis Patient has had chronic back, shoulder and knee pain.  On exam, she has multiple OA nodules on her PIP joints that is mildly tender to palpation.  Limited range of motion of the fingers. --Continue home Norco 10-325 mg every 6 hours for  moderate, severe pain --Continue prednisone 5 mg daily --Continue Robaxin 500 mg 3 times daily as needed for muscle spasm --Follow-up vitamin D levels  #HTN Hypotensive on admission.   --Hold home Cozaar 25 mg.  #GERD Resume home Protonix  CODE STATUS: Full code DIET: Regular PPx: Lovenox  Dispo: Admit patient to Inpatient with expected length of stay greater than 2 midnights.  Signed: Lacinda Axon, MD 02/06/2022, 6:49 PM  Pager: [phone] Internal Medicine Teaching Service After 5pm on weekdays and 1pm on weekends: On Call pager: [phone]

## 2022-02-06 NOTE — ED Notes (Signed)
RN called floor to request purple man, Tim states that the charge nurse will be notified as soon as possible. ?

## 2022-02-06 NOTE — ED Notes (Signed)
Pt's neighbor has gone home, call Vinetta [place] [phone], for updates. ?

## 2022-02-06 NOTE — ED Provider Notes (Addendum)
[facility] EMERGENCY DEPARTMENT Provider Note   CSN: [id number] Arrival date & time: 02/06/22  1152     History  Chief Complaint  Patient presents with   Hip Pain    Isabel Barnes is a 81 y.o. female.   Hip Pain Associated symptoms include headaches. Pertinent negatives include no abdominal pain and no shortness of breath. Patient presents after a fall.  States she fell out of her chair hit her head on the way down and now complaining of pain in her right hip.  Does have a history of chronic back pain and joint pain.  On chronic hydrocodone for it.  Not on blood thinners.  States pain is severe.  Does not think she be able to walk right now.  Also chronic wounds on her lower extremities.  Particular on right lower extremity.  Now more redness going up her right leg.  States she cannot take antibiotics because she is allergic to everyone she is ever been given.  She states that if she gets infection she just has to get "thoughts and prayers".  No known history of atrial fibrillation or irregular heartbeat.    Past Medical History:  Diagnosis Date   Anemia    iron deficiency hx.has had iron infusions before    Chronic low back pain    Chronic pain disorder    Complication of anesthesia    severe claustrophobia   Constipation    r/t use of pain meds.Takes OTC meds or eats prunes   Depression    GERD (gastroesophageal reflux disease)    takes Omeprazole daily   Heart murmur    mild MS, moderate-severe AS 03/17/21 echo   History of bronchitis    20+ yrs ago   History of kidney stones    3 surgerical removed, 1 passed   History of prolapse of bladder    History of shingles    Hypertension    takes Losartan daily   Hypothyroidism    takes Synthroid daily   Joint swelling    Neck pain    bone spurs at base of head per pt   Osteoarthritis    lumbar,cervical,joints   Pneumonia    hx of > 20 yrs ago   Shortness of breath    occasionally and with  exertion. 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[place])    Unsteady gait    occasionally   Urinary urgency     Home Medications Prior to Admission medications   Medication Sig Start Date End Date Taking? Authorizing Provider  albuterol (VENTOLIN HFA) 108 (90 Base) MCG/ACT inhaler Inhale 2 puffs into the lungs every 4 (four) hours as needed for shortness of breath.     [provider]  aspirin EC 81 MG tablet Take 81 mg by mouth daily.    [provider]  carboxymethylcellulose (REFRESH PLUS) 0.5 % SOLN Place 1 drop into both eyes in the morning, at noon, and at bedtime.    [provider]  cholecalciferol (VITAMIN D) 1000 UNITS tablet Take 1,000 Units by mouth 2 (two) times daily.    [provider]  diclofenac sodium (VOLTAREN) 1 % GEL Apply 2 g topically 2 (two) times daily as needed (for pain).    [provider]  Digestive Enzymes (ENZYME DIGEST PO) Take 1 tablet by mouth daily as needed (as directed).    [provider]  diphenhydrAMINE (BENADRYL)  25 MG tablet Take 50 mg by mouth in the morning, at noon, in the evening, and at bedtime.    [provider]  ENSURE PLUS (ENSURE PLUS) LIQD Take 237 mLs by mouth daily as needed (for supplementation).    [provider]  fish oil-omega-3 fatty acids 1000 MG capsule Take 1,000 mg by mouth 2 (two) times daily.     [provider]  furosemide (LASIX) 20 MG tablet Take 20 mg by mouth daily as needed for fluid.    [provider]  HYDROcodone-acetaminophen (NORCO) 10-325 MG tablet Take 1-2 tablets by mouth every 6 (six) hours as needed for severe pain. Patient taking differently: Take 1 tablet by mouth See admin instructions. Take 1 tablet by mouth four to five times a day 07/16/16   Gary Fleet, PA-C  levothyroxine (SYNTHROID, LEVOTHROID) 50 MCG tablet Take 50 mcg by mouth every morning.     [provider]  loperamide (IMODIUM A-D) 2 MG tablet Take 4 mg by mouth as needed for diarrhea or loose stools.     [provider]  losartan (COZAAR) 25 MG tablet Take 25 mg by mouth daily.    [provider]  magnesium 30 MG tablet Take 30 mg by mouth daily.     [provider]  methocarbamol (ROBAXIN) 500 MG tablet Take 500 mg by mouth 3 (three) times daily as needed for muscle spasms. 12/03/19   [provider]  Multiple Vitamins-Minerals (MULTIVITAMIN PO) Take 1 tablet by mouth daily.    [provider]  omeprazole (PRILOSEC) 20 MG capsule Take 20 mg by mouth See admin instructions. Take 20 mg by mouth in the morning before breakfast and an additional 20 mg during the day as needed for reflux    [provider]  ondansetron (ZOFRAN) 8 MG tablet Take 1 tablet (8 mg total) by mouth every 8 (eight) hours as needed for nausea or vomiting. 06/25/17   Daleen Bo, MD  POTASSIUM PO Take 1 tablet by mouth daily.    [provider]  predniSONE (DELTASONE) 5 MG tablet Take 5 mg by mouth daily with breakfast. 12/03/19   [provider]  Probiotic Product (PROBIOTIC DAILY PO) Take 1 capsule by mouth daily.     [provider]  prochlorperazine (COMPAZINE) 10 MG tablet Take 1 tablet (10 mg total) by mouth 2 (two) times daily as needed for nausea or vomiting (or headache, may take with benadryl 25mg ). 04/25/20   Gareth Morgan, MD  promethazine (PHENERGAN) 12.5 MG tablet Take 12.5 mg by mouth every 6 (six) hours as needed for nausea or vomiting.    [provider]  RESTASIS 0.05 % ophthalmic emulsion Place 1 drop into both eyes daily. 09/18/19   [provider]  triamcinolone (NASACORT) 55 MCG/ACT AERO nasal inhaler Place 1-2 sprays into the nose 2 (two) times daily as needed (for seasonal allergies). 07/14/17   [provider]  Turmeric 500 MG CAPS Take 500 mg by mouth at bedtime.     [provider]  vitamin B-12 (CYANOCOBALAMIN) 250 MCG tablet Take 250 mcg by mouth at bedtime.     [provider]      Allergies    Dilaudid [hydromorphone hcl]; Gabapentin; Latex; Lyrica [pregabalin]; Oxycodone; Singulair [montelukast]; Ciprofloxacin hcl; Codeine; Methadone; Metronidazole; Oysters [shellfish allergy]; Clindamycin/lincomycin; Donepezil; Sulfa antibiotics; Tape; Zetia [ezetimibe]; Elemental sulfur; Iodine; Other; Oyster shell; Penicillin g; Penicillins; Povidone iodine; Skintegrity hydrogel [skin protectants, misc.]; and Tapentadol  Review of Systems   Review of Systems  Constitutional:  Negative for appetite change.  Respiratory:  Negative for shortness of breath.   Gastrointestinal:  Negative for abdominal pain and blood in stool.  Musculoskeletal:  Positive for back pain.       Right hip pain.  Neurological:  Positive for headaches.   Physical Exam Updated Vital Signs BP (!) 93/56    Pulse (!) 144    Temp 98.5 F (36.9 C) (Oral)    Resp 18    Ht 5' (1.524 m)    Wt 49.4 kg    SpO2 97%    BMI 21.29 kg/m  Physical Exam Vitals and nursing note reviewed.  HENT:     Head: Atraumatic.  Eyes:     Pupils: Pupils are equal, round, and reactive to light.  Cardiovascular:     Rate and Rhythm: Regular rhythm.     Comments: Initial regular heart rate at normal rate later increased rate up to 150. Abdominal:     Tenderness: There is no abdominal tenderness.  Musculoskeletal:     Cervical back: Neck supple.     Comments: Edema with chronic changes of bilateral lower extremities.  There is exposed bone on right anterior lower leg.  There is erythema tracking up to the groin on the right side.  Tenderness to right hip with no deformity.  Neurological:     Mental Status: She is alert and oriented to person, place, and time.       ED Results / Procedures / Treatments   Labs (all labs ordered are listed, but only abnormal results are displayed) Labs Reviewed   COMPREHENSIVE METABOLIC PANEL - Abnormal; Notable for the following components:      Result Value   Sodium 132 (*)    Glucose, Bld 103 (*)    BUN 24 (*)    Calcium 8.3 (*)    Total Protein 5.3 (*)    Albumin 2.1 (*)    All other components within normal limits  CBC WITH DIFFERENTIAL/PLATELET - Abnormal; Notable for the following components:   WBC 16.3 (*)    RBC 3.26 (*)    Hemoglobin 8.5 (*)    HCT 26.5 (*)    Platelets 478 (*)    Neutro Abs 14.2 (*)    Monocytes Absolute 1.1 (*)    Abs Immature Granulocytes 0.09 (*)    All other components within normal limits  TSH  POC OCCULT BLOOD, ED    EKG EKG Interpretation  Date/Time:  Saturday February 06 2022 13:05:40 EST Ventricular Rate:  155 PR Interval:  89 QRS Duration: 84 QT Interval:  290 QTC Calculation: 466 R Axis:   94 Text Interpretation: Supraventricular tachycardia Multiple premature complexes, vent & supraven Right axis deviation Repolarization abnormality, prob rate related Artifact in lead(s) I III aVL Confirmed by Davonna Belling [phone]) on 02/06/2022 1:07:55 PM  Radiology DG Chest 1 View  Result Date: 02/06/2022 CLINICAL DATA:  Fall.  Altered mental status. EXAM: CHEST  1 VIEW COMPARISON:  03/16/2021 and prior radiographs FINDINGS: UPPER limits normal heart size noted. Interstitial prominence/peribronchial thickening is again noted. There is no evidence of focal airspace disease, pulmonary edema, suspicious pulmonary nodule/mass, pleural effusion, or pneumothorax. No acute bony abnormalities are identified. Surgical changes within the cervical spine and RIGHT shoulder noted. IMPRESSION: No evidence of acute cardiopulmonary disease. Electronically Signed   By: Margarette Canada M.D.   On: 02/06/2022 13:09   CT HEAD WO CONTRAST (5MM)  Result Date: 02/06/2022 CLINICAL DATA:  81 year old female with head injury and altered mental status. EXAM: CT HEAD WITHOUT CONTRAST TECHNIQUE: Contiguous axial images were obtained from  the base of the skull through the vertex without intravenous contrast. RADIATION DOSE REDUCTION: This exam was performed according to the departmental dose-optimization program which includes automated exposure control, adjustment of the mA and/or kV according to patient size and/or use of iterative reconstruction technique. COMPARISON:  12/22/2020 and prior CTs FINDINGS: Brain: No evidence of acute infarction, hemorrhage, hydrocephalus, extra-axial collection or mass lesion/mass effect. 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 Vascular: Carotid and vertebral atherosclerotic calcifications are noted. Skull: Normal. Negative for fracture or focal lesion. Sinuses/Orbits: No acute finding. Other: None. IMPRESSION: 1. No evidence of acute intracranial abnormality. 2. Atrophy, chronic small-vessel white matter ischemic changes and remote RIGHT cerebellar infarct. Electronically Signed   By: Margarette Canada M.D.   On: 02/06/2022 13:14   DG Hip Unilat W or Wo Pelvis 2-3 Views Right  Result Date: 02/06/2022 CLINICAL DATA:  Acute RIGHT hip pain following fall. Initial encounter. EXAM: DG HIP (WITH OR WITHOUT PELVIS) 2-3V RIGHT COMPARISON:  08/13/2008 FINDINGS: There is no evidence of acute fracture or dislocation. Mild to moderate degenerative changes in both hips and LOWER lumbar spine again noted. No focal bony lesions are identified. IMPRESSION: 1. No evidence of acute abnormality. 2. Mild to moderate degenerative changes. Electronically Signed   By: Margarette Canada M.D.   On: 02/06/2022 13:11    Procedures Procedures    Medications Ordered in ED Medications  diltiazem (CARDIZEM) 125 mg in dextrose 5% 125 mL (1 mg/mL) infusion (5 mg/hr Intravenous New Bag/Given 02/06/22 1341)  fentaNYL (SUBLIMAZE) injection 100 mcg (100 mcg Intravenous Given 02/06/22 1231)  sodium chloride 0.9 % bolus 500 mL (0 mLs Intravenous Stopped 02/06/22 1330)  adenosine  (ADENOCARD) 6 MG/2ML injection 6 mg (6 mg Intravenous Given 02/06/22 1327)  sodium chloride 0.9 % bolus 500 mL (0 mLs Intravenous Stopped 02/06/22 1409)    ED Course/ Medical Decision Making/ A&P                           Medical Decision Making Problems Addressed: Anemia, unspecified type: acute illness or injury Atrial fibrillation, unspecified type [facility]): acute illness or injury Cellulitis of right lower extremity: chronic illness or injury Fall, initial encounter: acute illness or injury  Amount and/or Complexity of Data Reviewed Independent Historian: friend    Details: Patient is neighbor/caregiver was at bedside Labs: ordered. Radiology: ordered and independent interpretation performed. Decision-making details documented in ED Course. ECG/medicine tests: independent interpretation performed. Decision-making details documented in ED Course.  Risk Prescription drug management. Decision regarding hospitalization.  Critical Care Total time providing critical care: 30 minutes  Patient presents after fall.  Mechanical slid out of her chair.  Complaining of pain in right hip and head.  Head CT done and right hip x-ray done reassuring.  Chest x-ray also reassuring without volume overload does have chronic edema on her legs.  Also with wound on right leg.  Some erythema tracking up the thigh.  While in the ER developed tachycardia.  Felt a little chest tightness with it.  No known history of arrhythmia.  Has not felt issues like this before but did not say feel this 1 coming on either.  Narrow complex tachycardia with some strain likely related to the rate.  Adenosine given without resolution  of the rhythm.  Did have a good pause.  I think with a rate consistently hovering around 150 it is likely a atrial fibrillation.  Does have an anemia with hemoglobin of 8.5.  Down from baseline.  Guaiac negative.  Also recent fall.  Will be seen by cardiology, Dr. Caryl Comes, also discussed with unassigned  medicine, residents for admission.  Mild hypotension.  Fluid bolus given.  Potentially multifactorial due to anemia and tachycardia.  Severe sepsis felt less likely.  With continued hypotension patient was transfused up to 30 mL/kg.  Antibiotics given to cover potential cellulitis.  Also will get CT scan to evaluate for pulmonary embolism.  Initial lactic acid reassuring.  Doubt severe sepsis with normal lactic acid.  Will admit to hospital        Final Clinical Impression(s) / ED Diagnoses Final diagnoses:  Fall, initial encounter  Anemia, unspecified type  Cellulitis of right lower extremity  Atrial fibrillation, unspecified type [facility])    Rx / DC Orders ED Discharge Orders     None         Davonna Belling, MD 02/06/22 1442    Davonna Belling, MD 02/06/22 [phone]

## 2022-02-06 NOTE — ED Notes (Signed)
Rocephin delayed due to IV incompatibility ?

## 2022-02-06 NOTE — Consult Note (Signed)
ELECTROPHYSIOLOGY CONSULT NOTE  Patient ID: TENASIA AULL, MRN: [id number], DOB/AGE: 83-Dec-1942 83 y.o. Admit date: 02/06/2022 Date of Consult: 02/06/2022  Primary Physician: Jamal Collin, PA-C Primary Cardiologist:  MA KAITYLN KALLSTROM is a 81 y.o. female who is being seen today for the evaluation of atrial fibrillation at the request of Dr Rubin Payor.   Chief Complaint: afib   HPI MELLINA BENISON is a 81 y.o. female was seen in the ER because of a fall when she hit her head and with complaints of hip pain -- no hip fracture or acute IC bleed Was in afib and RVR and hypotension when first monitored.  Adenosine was given confirming atrial fibrillation  Efforts to control the rate with diltiazem-further complicated by hypotension.  She is unaware of her palpitations.  She has a 30 pound weight loss in the last year, this despite vigorous appetite.  She is described as having "venous insufficiency "with very swollen left greater than right calves and healed eschar with diffuse erythema.  Longstanding anemia worse over the last year describes black tarry stools in the last couple of days but apparently in the emergency room guaiac negative  DATE TEST EF   4/22 Myoview   65 % No perfusion defects  4/22 Echo   65 % 1RVSP 65 LAE 50 cc/m2              Date Cr K Hgb  3/23 0.68 3.8 8.5             Past Medical History:  Diagnosis Date   Anemia    iron deficiency hx.has had iron infusions before    Chronic low back pain    Chronic pain disorder    Complication of anesthesia    severe claustrophobia   Constipation    r/t use of pain meds.Takes OTC meds or eats prunes   Depression    GERD (gastroesophageal reflux disease)    takes Omeprazole daily   Heart murmur    mild MS, moderate-severe AS 03/17/21 echo   History of bronchitis    20+ yrs ago   History of kidney stones    3 surgerical removed, 1 passed   History of prolapse of bladder    History of  shingles    Hypertension    takes Losartan daily   Hypothyroidism    takes Synthroid daily   Joint swelling    Neck pain    bone spurs at base of head per pt   Osteoarthritis    lumbar,cervical,joints   Pneumonia    hx of > 20 yrs ago   Shortness of breath    occasionally and with exertion. 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HCC)    Unsteady gait    occasionally   Urinary urgency           Surgical History:  Past Surgical History:  Procedure Laterality Date   ABDOMINAL HYSTERECTOMY     ANTERIOR FUSION CERVICAL SPINE     x2 -C4-7   BUNIONECTOMY Bilateral    COLONOSCOPY     CYSTOSCOPY W/ URETEROSCOPY  2012   EYE SURGERY Bilateral    cataract /lens implant   HOLMIUM LASER APPLICATION Left 02/08/2013   Procedure: HOLMIUM LASER APPLICATION;  Surgeon: Anner Crete, MD;  Location: [facility];  Service: Urology;  Laterality: Left;   INSERTION OF MESH N/A 07/15/2014   Procedure: INSERTION OF  MESH;  Surgeon: Ardeth Sportsman, MD;  Location: [facility] OR;  Service: General;  Laterality: N/A;   JOINT REPLACEMENT Right 2012   shoulder   LAPAROSCOPIC CHOLECYSTECTOMY W/ CHOLANGIOGRAPHY  2012   Dr Magnus Ivan   NASAL SINUS SURGERY     OPEN REDUCTION INTERNAL FIXATION (ORIF) DISTAL RADIAL FRACTURE Left 03/20/2021   Procedure: OPEN REDUCTION INTERNAL FIXATION (ORIF) DISTAL RADIAL FRACTURE;  Surgeon: Teryl Lucy, MD;  Location: [facility] OR;  Service: Orthopedics;  Laterality: Left;   RADIOLOGY WITH ANESTHESIA N/A 05/09/2014   Procedure: ADULT SEDATION WITH ANESTHESIA/MRI CERVICAL SPINE WITHOUT CONTRAST;  Surgeon: Medication Radiologist, MD;  Location: [facility] OR;  Service: Radiology;  Laterality: N/A;  DR. 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 Authorizing Provider  albuterol (VENTOLIN HFA) 108 (90 Base) MCG/ACT inhaler Inhale 2 puffs into the lungs every 4 (four) hours as needed for shortness of breath.     [provider]  aspirin EC 81 MG tablet Take 81 mg by mouth daily.    [provider]  carboxymethylcellulose (REFRESH PLUS) 0.5 % SOLN Place 1 drop into both eyes in the morning, at noon, and at bedtime.    [provider]  cholecalciferol (VITAMIN D) 1000 UNITS tablet Take 1,000 Units by mouth 2 (two) times daily.    [provider]  diclofenac sodium (VOLTAREN) 1 % GEL Apply 2 g topically 2 (two) times daily as needed (for pain).    [provider]  Digestive Enzymes (ENZYME DIGEST PO) Take 1 tablet by mouth daily as needed (as directed).    [provider]  diphenhydrAMINE (BENADRYL) 25 MG tablet Take 50 mg by mouth in the morning, at noon, in the evening, and at bedtime.    [provider]  ENSURE PLUS (ENSURE PLUS) LIQD Take 237 mLs by mouth daily as needed (for supplementation).    [provider]  fish oil-omega-3 fatty acids 1000 MG capsule Take 1,000 mg by mouth 2 (two) times daily.     [provider]  furosemide (LASIX) 20 MG tablet Take 20 mg by mouth daily as needed for fluid.    [provider]  HYDROcodone-acetaminophen (NORCO) 10-325 MG tablet Take 1-2 tablets by mouth every 6 (six) hours as needed for severe pain. Patient taking differently: Take 1 tablet by mouth See admin instructions. Take 1 tablet by mouth four to five times a day  07/16/16   Marshia Ly, PA-C  levothyroxine (SYNTHROID, LEVOTHROID) 50 MCG tablet Take 50 mcg by mouth every morning.     [provider]  loperamide (IMODIUM A-D) 2 MG tablet Take 4 mg by mouth as needed for diarrhea or loose stools.     [provider]  losartan (COZAAR) 25 MG tablet Take 25 mg by mouth daily.    [provider]  magnesium 30 MG tablet Take 30 mg by mouth  daily.     [provider]  methocarbamol (ROBAXIN) 500 MG tablet Take 500 mg by mouth 3 (three) times daily as needed for muscle spasms. 12/03/19   [provider]  Multiple Vitamins-Minerals (MULTIVITAMIN PO) Take 1 tablet by mouth daily.    [provider]  omeprazole (PRILOSEC) 20 MG capsule Take 20 mg by mouth See admin instructions. Take 20 mg by mouth in the morning before breakfast and an additional 20 mg during the day as needed for reflux    [provider]  ondansetron (ZOFRAN) 8 MG tablet Take 1 tablet (8 mg total) by mouth every 8 (eight) hours as needed for nausea or vomiting. 06/25/17   Mancel Bale, MD  POTASSIUM PO Take 1 tablet by mouth daily.    [provider]  predniSONE (DELTASONE) 5 MG tablet Take 5 mg by mouth daily with breakfast. 12/03/19   [provider]  Probiotic Product (PROBIOTIC DAILY PO) Take 1 capsule by mouth daily.     [provider]  prochlorperazine (COMPAZINE) 10 MG tablet Take 1 tablet (10 mg total) by mouth 2 (two) times daily as needed for nausea or vomiting (or headache, may take with benadryl 25mg ). 04/25/20   04/27/20, MD  promethazine (PHENERGAN) 12.5 MG tablet Take 12.5 mg by mouth every 6 (six) hours as needed for nausea or vomiting.    [provider]  RESTASIS 0.05 % ophthalmic emulsion Place 1 drop into both eyes daily. 09/18/19   [provider]  triamcinolone (NASACORT) 55 MCG/ACT AERO nasal inhaler Place 1-2 sprays into the nose 2 (two) times daily as needed (for  seasonal allergies). 07/14/17   [provider]  Turmeric 500 MG CAPS Take 500 mg by mouth at bedtime.     [provider]  vitamin B-12 (CYANOCOBALAMIN) 250 MCG tablet Take 250 mcg by mouth at bedtime.     [provider]    Inpatient Medications:     Allergies:  Allergies  Allergen Reactions   Dilaudid [Hydromorphone Hcl] Shortness Of Breath   Gabapentin Other (See Comments)    Hoarseness , headache and sore throat   Latex Rash    Severe rash   Lyrica [Pregabalin] Other (See Comments)    No balance , had to walk with cane , Blurred vision,weakness.   Oxycodone Shortness Of Breath and Other (See Comments)    Cannot breathe.   Singulair [Montelukast] Shortness Of Breath and Other (See Comments)    Vision issues, also   Ciprofloxacin Hcl Nausea And Vomiting and Other (See Comments)    Nausea and vomiting with by mouth form   Codeine Other (See Comments)    Hallucinations   Methadone Nausea And Vomiting and Other (See Comments)    Severe nausea and vomiting   Metronidazole Nausea And Vomiting and Other (See Comments)    Gastric pain   Oysters [Shellfish Allergy] Other (See Comments)    "Terrible gastric upset and cramping."   Clindamycin/Lincomycin Diarrhea and Nausea Only   Donepezil Diarrhea and Other (See Comments)    Severe diarrhea   Sulfa Antibiotics Diarrhea and Other (See Comments)    GI issues    Tape Other (See Comments)    Severe rash   Zetia [Ezetimibe] Diarrhea   Elemental Sulfur Nausea And Vomiting   Iodine Rash   Other Rash    All Antibiotic ointments/ creams   Oyster Shell Rash   Penicillin G Nausea And Vomiting and Rash   Penicillins Nausea  And Vomiting and Rash    Has patient had a PCN reaction causing immediate rash, facial/tongue/throat swelling, SOB or lightheadedness with hypotension: Yes Has patient had a PCN reaction causing severe rash involving mucus membranes or skin necrosis: No Has patient had a PCN reaction that  required hospitalization No Has patient had a PCN reaction occurring within the last 10 years: No If all of the above answers are "NO", then may proceed with Cephalosporin use.    Povidone Iodine Rash and Other (See Comments)    Oyster shell products- Rash    Skintegrity Hydrogel [Skin Protectants, Misc.] Rash   Tapentadol Other (See Comments)    Nightmares    Social History   Socioeconomic History   Marital status: Divorced    Spouse name: Not on file   Number of children: Not on file   Years of education: Not on file   Highest education level: Not on file  Occupational History   Not on file  Tobacco Use   Smoking status: Former    Packs/day: 0.50    Years: 2.00    Pack years: 1.00    Types: Cigarettes    Quit date: 02/07/1992    Years since quitting: 30.0   Smokeless tobacco: Never  Vaping Use   Vaping Use: Never used  Substance and Sexual Activity   Alcohol use: No   Drug use: No   Sexual activity: Not on file  Other Topics Concern   Not on file  Social History Narrative   Not on file   Social Determinants of Health   Financial Resource Strain: Not on file  Food Insecurity: Not on file  Transportation Needs: Not on file  Physical Activity: Not on file  Stress: Not on file  Social Connections: Not on file  Intimate Partner Violence: Not on file     Family History  Problem Relation Age of Onset   Stroke Father      ROS:  Please see the history of present illness.     All other systems reviewed and negative.    Physical Exam:  Blood pressure (!) 89/59, pulse 92, temperature 98.5 F (36.9 C), temperature source Oral, resp. rate (!) 23, height 5' (1.524 m), weight 49.4 kg, SpO2 (!) 89 %. General: Cachectic tearful white female in moderate discomfort Head: Frontal wasting, atraumatic, sclera non-icteric, no xanthomas, nares are without discharge. EENT: normal Lymph Nodes:  none neck: Negative for carotid bruits. JVD approximately 10. Lungs: Clear  laterally   Heart: Irregularly irregular and very rapid with S1 S2. no murmur , rubs, or gallops appreciated. Abdomen: Soft, non-tender, non-distended with normoactive bowel sounds. No hepatomegaly. No rebound/guarding. No obvious abdominal masses. Msk:  Strength and tone appear normal for age. Extremities: No clubbing or cyanosis.  Erythematous calves with significant swelling and healed eschars with some drainage  right greater than left edema edema but surprisingly little swelling at the ankles.  Distal pedal pulses are 2+ and equal bilaterally. Skin: Warm and Dry  Neuro: Alert and oriented X 3. CN III-XII intact Grossly normal sensory and motor function . Psych:  Responds to questions appropriately with a normal affect.      Labs: Cardiac Enzymes No results for input(s): CKTOTAL, CKMB, TROPONINI in the last 72 hours. CBC Lab Results  Component Value Date   WBC 16.3 (H) 02/06/2022   HGB 8.5 (L) 02/06/2022   HCT 26.5 (L) 02/06/2022   MCV 81.3 02/06/2022   PLT 478 (H) 02/06/2022   PROTIME:  No results for input(s): LABPROT, INR in the last 72 hours. Chemistry  Recent Labs  Lab 02/06/22 1239  NA 132*  K 3.8  CL 98  CO2 22  BUN 24*  CREATININE 0.68  CALCIUM 8.3*  PROT 5.3*  BILITOT 0.6  ALKPHOS 79  ALT 20  AST 26  GLUCOSE 103*   Lipids Lab Results  Component Value Date   CHOL 219 (H) 03/16/2021   HDL 79 03/16/2021   LDLCALC 130 (H) 03/16/2021   TRIG 48 03/16/2021   BNP No results found for: PROBNP Thyroid Function Tests: Recent Labs    02/06/22 1311  TSH 4.449           EKG: Atrial fibrillation with a rapid ventricular respons   Assessment and Plan:  Atrial fibrillation with rapid ventricular response  Hypotension with warm extremities  Lower extremity cellulitis?  Sepsis  Anemia-chronic-worse  Weight loss-profound-unintentional  DNR   The patient situation is very difficult and the options significantly limited by her desire to not have  resuscitative efforts including medications shocks and/or intubation.  Hence, we will use amiodarone to try to slow her rate.  She will need anticoagulation.    Her hypotension and significant tachycardia could be secondary to septic shock which is quite likely given warmth in her legs.  Also concerned about pulmonary embolism as she has a high pretest likelihood given her leg issues also.  Transfusion is an appropriate consideration  [place] this [place].  Her situation is tenuous and I suspect that with her weight loss despite her vigorous appetite that she may well be harboring a malignancy in addition to the lower extremity infections that we see in the atrial fibrillation which I suspect is consequential.  We will follow with you   Sherryl MangesSteven Kyleen Villatoro

## 2022-02-06 NOTE — ED Triage Notes (Signed)
Pt BIB [facility] today after a neighbor called for the patient reporting a fall. Patient reported to EMS that she slid out of her chair and fell, hitting her head on the table on the way down. EMS reports no obvious inj to head. Patient reports to EMS 10/10 right hip pain, no obv deformity or shortening but pain on palpation. EMS reports Distal CNS intact and significant lower extremity edema. GCS 15 ?

## 2022-02-06 NOTE — ED Notes (Signed)
Patient transported to X-ray 

## 2022-02-06 NOTE — ED Notes (Signed)
Got patient into a gown on the did ekg shown to Dr Alvino Chapel got patient some warm blankets patient is resting with call bell in reach  ?

## 2022-02-07 ENCOUNTER — Inpatient Hospital Stay (HOSPITAL_COMMUNITY): Payer: Medicare Other

## 2022-02-07 DIAGNOSIS — M7989 Other specified soft tissue disorders: Secondary | ICD-10-CM

## 2022-02-07 DIAGNOSIS — D649 Anemia, unspecified: Secondary | ICD-10-CM

## 2022-02-07 DIAGNOSIS — M79604 Pain in right leg: Secondary | ICD-10-CM

## 2022-02-07 DIAGNOSIS — L538 Other specified erythematous conditions: Secondary | ICD-10-CM

## 2022-02-07 DIAGNOSIS — R600 Localized edema: Secondary | ICD-10-CM

## 2022-02-07 DIAGNOSIS — I350 Nonrheumatic aortic (valve) stenosis: Secondary | ICD-10-CM | POA: Diagnosis not present

## 2022-02-07 DIAGNOSIS — L03115 Cellulitis of right lower limb: Secondary | ICD-10-CM | POA: Diagnosis not present

## 2022-02-07 LAB — IRON AND TIBC
Iron: 21 ug/dL — ABNORMAL LOW (ref 28–170)
Saturation Ratios: 14 % (ref 10.4–31.8)
TIBC: 155 ug/dL — ABNORMAL LOW (ref 250–450)
UIBC: 134 ug/dL

## 2022-02-07 LAB — SEDIMENTATION RATE: Sed Rate: 40 mm/hr — ABNORMAL HIGH (ref 0–22)

## 2022-02-07 LAB — BASIC METABOLIC PANEL
Anion gap: 8 (ref 5–15)
BUN: 18 mg/dL (ref 8–23)
CO2: 24 mmol/L (ref 22–32)
Calcium: 8 mg/dL — ABNORMAL LOW (ref 8.9–10.3)
Chloride: 103 mmol/L (ref 98–111)
Creatinine, Ser: 0.61 mg/dL (ref 0.44–1.00)
GFR, Estimated: >60 mL/min (ref >60)
Glucose, Bld: 111 mg/dL — ABNORMAL HIGH (ref 70–99)
Potassium: 4.2 mmol/L (ref 3.5–5.1)
Sodium: 135 mmol/L (ref 135–145)

## 2022-02-07 LAB — PROTIME-INR
INR: 1.3 — ABNORMAL HIGH (ref 0.8–1.2)
Prothrombin Time: 16.1 seconds — ABNORMAL HIGH (ref 11.4–15.2)

## 2022-02-07 LAB — TYPE AND SCREEN
ABO/RH(D): A NEG
Antibody Screen: NEGATIVE
Unit division: 0

## 2022-02-07 LAB — CBC
HCT: 29.2 % — ABNORMAL LOW (ref 36.0–46.0)
Hemoglobin: 9.7 g/dL — ABNORMAL LOW (ref 12.0–15.0)
MCH: 26.8 pg (ref 26.0–34.0)
MCHC: 33.2 g/dL (ref 30.0–36.0)
MCV: 80.7 fL (ref 80.0–100.0)
Platelets: 436 10*3/uL — ABNORMAL HIGH (ref 150–400)
RBC: 3.62 MIL/uL — ABNORMAL LOW (ref 3.87–5.11)
RDW: 14.9 % (ref 11.5–15.5)
WBC: 15 10*3/uL — ABNORMAL HIGH (ref 4.0–10.5)
nRBC: 0 % (ref 0.0–0.2)

## 2022-02-07 LAB — BPAM RBC
Blood Product Expiration Date: 202304012359
ISSUE DATE / TIME: 202303112203
Unit Type and Rh: 600

## 2022-02-07 LAB — LIPID PANEL
Cholesterol: 106 mg/dL (ref 0–200)
HDL: 25 mg/dL — ABNORMAL LOW (ref >40)
LDL Cholesterol: 66 mg/dL (ref 0–99)
Total CHOL/HDL Ratio: 4.2 RATIO
Triglycerides: 73 mg/dL (ref <150)
VLDL: 15 mg/dL (ref 0–40)

## 2022-02-07 LAB — MAGNESIUM: Magnesium: 1.8 mg/dL (ref 1.7–2.4)

## 2022-02-07 LAB — PHOSPHORUS: Phosphorus: 3 mg/dL (ref 2.5–4.6)

## 2022-02-07 LAB — C-REACTIVE PROTEIN: CRP: 20.5 mg/dL — ABNORMAL HIGH (ref <1.0)

## 2022-02-07 LAB — APTT: aPTT: 40 seconds — ABNORMAL HIGH (ref 24–36)

## 2022-02-07 LAB — VITAMIN D 25 HYDROXY (VIT D DEFICIENCY, FRACTURES): Vit D, 25-Hydroxy: 69.88 ng/mL (ref 30–100)

## 2022-02-07 LAB — FERRITIN: Ferritin: 238 ng/mL (ref 11–307)

## 2022-02-07 MED ORDER — PHYTONADIONE 5 MG PO TABS
10.0000 mg | ORAL_TABLET | Freq: Once | ORAL | Status: AC
  Administered 2022-02-07: 10 mg via ORAL
  Filled 2022-02-07: qty 2

## 2022-02-07 MED ORDER — ENOXAPARIN SODIUM 40 MG/0.4ML IJ SOSY
40.0000 mg | PREFILLED_SYRINGE | INTRAMUSCULAR | Status: DC
  Administered 2022-02-07: 40 mg via SUBCUTANEOUS
  Filled 2022-02-07: qty 0.4

## 2022-02-07 MED ORDER — ACETAMINOPHEN 500 MG PO TABS
1000.0000 mg | ORAL_TABLET | Freq: Three times a day (TID) | ORAL | Status: DC
Start: 2022-02-07 — End: 2022-02-22
  Administered 2022-02-07 – 2022-02-22 (×35): 1000 mg via ORAL
  Filled 2022-02-07 (×37): qty 2

## 2022-02-07 MED ORDER — MAGNESIUM SULFATE 2 GM/50ML IV SOLN
2.0000 g | Freq: Once | INTRAVENOUS | Status: AC
  Administered 2022-02-07: 2 g via INTRAVENOUS
  Filled 2022-02-07: qty 50

## 2022-02-07 MED ORDER — HYDROCODONE-ACETAMINOPHEN 10-325 MG PO TABS
1.0000 | ORAL_TABLET | ORAL | Status: DC | PRN
Start: 2022-02-07 — End: 2022-02-10
  Administered 2022-02-07 – 2022-02-10 (×9): 1 via ORAL
  Filled 2022-02-07 (×9): qty 1

## 2022-02-07 MED ORDER — AMIODARONE HCL 200 MG PO TABS
200.0000 mg | ORAL_TABLET | Freq: Two times a day (BID) | ORAL | Status: DC
  Administered 2022-02-07 – 2022-02-22 (×31): 200 mg via ORAL
  Filled 2022-02-07 (×31): qty 1

## 2022-02-07 NOTE — Consult Note (Signed)
[facility] Consult  Referring Provider: Internal medicine teaching service Primary Care Physician:  Ardith Dark, PA-C Primary Gastroenterologist: Dr. Norwood [facility]o [facility]  Reason for Consultation: Melena  HPI: Isabel Barnes is a 81 y.o. female was admitted on 02/06/2022 after a fall.  Admitted with atrial fibrillation with RVR, hypotension and lower extremity cellulitis, unintentional weight loss(30 pounds in 1 year) and chronic anemia. Started on amiodarone  2 episodes of black stool in past month Patient states that she movement several weeks however recent bowel movement have been out black stools or blood in it. Patient states she has a good appetite, has mild acid reflux for which takes Prilosec, intermittently has difficulty swallowing solids as well as liquids, denies any satiety or bloating. Denies abdominal pain.  Previous GI work-up: Colonoscopy 2011-diverticulosis in sigmoid EGD 2011-unremarkable Capsule endoscopy 2012-normal Colonoscopy 2000-left-sided diverticulosis  Past Medical History:  Diagnosis Date   Anemia    iron deficiency hx.has had iron infusions before    Chronic low back pain    Chronic pain disorder    Complication of anesthesia    severe claustrophobia   Constipation    r/t use of pain meds.Takes OTC meds or eats prunes   Depression    GERD (gastroesophageal reflux disease)    takes Omeprazole daily   Heart murmur    mild MS, moderate-severe AS 03/17/21 echo   History of bronchitis    20+ yrs ago   History of kidney stones    3 surgerical removed, 1 passed   History of prolapse of bladder    History of shingles    Hypertension    takes Losartan daily   Hypothyroidism    takes Synthroid daily   Joint swelling    Neck pain    bone spurs at base of head per pt   Osteoarthritis    lumbar,cervical,joints   Pneumonia    hx of > 20 yrs ago   Shortness of breath    occasionally and with exertion. Albuterol inhaler as needed    Spinal headache 1991   blood patch placed   Spondylitis ([place])    Unsteady gait    occasionally   Urinary urgency     Past Surgical History:  Procedure Laterality Date   ABDOMINAL HYSTERECTOMY     ANTERIOR FUSION CERVICAL SPINE     x2 -C4-7   BUNIONECTOMY Bilateral    COLONOSCOPY     CYSTOSCOPY W/ URETEROSCOPY  2012   EYE SURGERY Bilateral    cataract /lens implant   HOLMIUM LASER APPLICATION Left [id number]   Procedure: HOLMIUM LASER APPLICATION;  Surgeon: Malka So, MD;  Location: [facility];  Service: Urology;  Laterality: Left;   INSERTION OF MESH N/A 07/15/2014   Procedure: INSERTION OF MESH;  Surgeon: Adin Hector, MD;  Location: [place];  Service: General;  Laterality: N/A;   JOINT REPLACEMENT Right 2012   shoulder   LAPAROSCOPIC CHOLECYSTECTOMY W/ CHOLANGIOGRAPHY  2012   Dr Ninfa Linden   NASAL SINUS SURGERY     OPEN REDUCTION INTERNAL FIXATION (ORIF) DISTAL RADIAL FRACTURE Left 03/20/2021   Procedure: OPEN REDUCTION INTERNAL FIXATION (ORIF) DISTAL RADIAL FRACTURE;  Surgeon: Marchia Bond, MD;  Location: [place];  Service: Orthopedics;  Laterality: Left;   RADIOLOGY WITH ANESTHESIA N/A 05/09/2014   Procedure: ADULT SEDATION WITH ANESTHESIA/MRI CERVICAL SPINE WITHOUT CONTRAST;  Surgeon: Medication Radiologist, MD;  Location: [place];  Service: Radiology;  Laterality: N/A;  DR. 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 Authorizing Provider  albuterol (VENTOLIN HFA) 108 (90 Base) MCG/ACT inhaler Inhale 2 puffs into the lungs every 4 (four) hours as needed for shortness of breath.    Yes [provider]  aspirin EC 81 MG tablet Take 81 mg by mouth daily.   Yes [provider]  carboxymethylcellulose (REFRESH PLUS) 0.5 % SOLN Place 1 drop into both eyes in the morning, at noon, and at bedtime.   Yes [provider]  cholecalciferol (VITAMIN D) 1000 UNITS tablet Take 1,000 Units by mouth 2 (two) times daily.   Yes [provider]  diclofenac sodium (VOLTAREN) 1 % GEL Apply 2 g topically 2 (two) times daily as needed (for pain).   Yes [provider]  Digestive Enzymes (ENZYME DIGEST PO) Take 1 tablet by mouth daily as needed (For digestive).   Yes [provider]  diphenhydrAMINE (BENADRYL) 25 MG tablet Take 25 mg by mouth every 6 (six) hours as needed for allergies.   Yes [provider]  ENSURE PLUS (ENSURE PLUS) LIQD Take 237 mLs by mouth daily as needed (for supplementation).   Yes [provider]  fish oil-omega-3 fatty acids 1000 MG capsule Take 1,000 mg by mouth 2 (two) times daily.    Yes [provider]  furosemide (LASIX) 20 MG tablet Take 20 mg by mouth daily as needed for fluid.   Yes [provider]  HYDROcodone-acetaminophen (NORCO) 10-325 MG tablet Take 1-2 tablets by mouth every 6 (six) hours as needed for severe pain. Patient taking differently: Take 1 tablet by mouth every 6 (six) hours as needed for moderate pain. 07/16/16  Yes Gary Fleet, PA-C  loperamide (IMODIUM A-D) 2 MG tablet Take 4 mg by mouth 3 (three) times daily as needed for diarrhea or loose stools.   Yes [provider]  losartan (COZAAR) 25 MG tablet Take 25 mg by mouth daily.   Yes [provider]  magnesium 30 MG tablet Take 30 mg by mouth daily.    Yes [provider]  methocarbamol  (ROBAXIN) 500 MG tablet Take 500 mg by mouth 3 (three) times daily as needed for muscle spasms. 12/03/19  Yes [provider]  Multiple Vitamins-Minerals (MULTIVITAMIN PO) Take 1 tablet by mouth daily.   Yes [provider]  ondansetron (ZOFRAN) 8 MG tablet Take 1 tablet (8 mg total) by mouth every 8 (eight) hours as needed for nausea or vomiting. 06/25/17  Yes Daleen Bo, MD  POTASSIUM PO Take 1 tablet by mouth daily.   Yes [provider]  predniSONE (DELTASONE) 5 MG tablet Take 5 mg by mouth daily with breakfast. 12/03/19  Yes [provider]  Probiotic Product (PROBIOTIC DAILY PO) Take 1 capsule by mouth daily.    Yes [provider]  RESTASIS 0.05 % ophthalmic emulsion Place 1 drop into both eyes daily. 09/18/19  Yes [provider]  rosuvastatin (CRESTOR)  5 MG tablet Take 5 mg by mouth daily. 01/27/22  Yes [provider]  triamcinolone (NASACORT) 55 MCG/ACT AERO nasal inhaler Place 1-2 sprays into the nose 2 (two) times daily as needed (for seasonal allergies). 07/14/17  Yes [provider]  Turmeric 500 MG CAPS Take 500 mg by mouth at bedtime.    Yes [provider]  vitamin B-12 (CYANOCOBALAMIN) 250 MCG tablet Take 250 mcg by mouth at bedtime.    Yes [provider]  levothyroxine (SYNTHROID, LEVOTHROID) 50 MCG tablet Take 50 mcg by mouth every morning.  Patient not taking: Reported on 02/06/2022    [provider]  omeprazole (PRILOSEC) 20 MG capsule Take 20 mg by mouth See admin instructions. Take 20 mg by mouth in the morning before breakfast and an additional 20 mg during the day as needed for reflux    [provider]  prochlorperazine (COMPAZINE) 10 MG tablet Take 1 tablet (10 mg total) by mouth 2 (two) times daily as needed for nausea or vomiting (or headache, may take with benadryl 25mg ). Patient not taking: Reported on 02/06/2022 04/25/20   Gareth Morgan, MD    Current  Facility-Administered Medications  Medication Dose Route Frequency Provider Last Rate Last Admin   0.9 %  sodium chloride infusion (Manually program via Guardrails IV Fluids)   Intravenous Once Lacinda Axon, MD       acetaminophen (TYLENOL) tablet 1,000 mg  1,000 mg Oral TID Lacinda Axon, MD       amiodarone (PACERONE) tablet 200 mg  200 mg Oral BID Deboraha Sprang, MD   200 mg at 02/07/22 1303   cefTRIAXone (ROCEPHIN) 2 g in sodium chloride 0.9 % 100 mL IVPB  2 g Intravenous Q24H Velna Ochs, MD       cycloSPORINE (RESTASIS) 0.05 % ophthalmic emulsion 1 drop  1 drop Both Eyes Daily Lacinda Axon, MD   1 drop at 02/07/22 1146   HYDROcodone-acetaminophen (NORCO) 10-325 MG per tablet 1 tablet  1 tablet Oral Q4H PRN Lacinda Axon, MD   1 tablet at 02/07/22 1303   methocarbamol (ROBAXIN) tablet 500 mg  500 mg Oral TID PRN Lacinda Axon, MD   500 mg at 02/07/22 1146   pantoprazole (PROTONIX) EC tablet 40 mg  40 mg Oral Daily Lacinda Axon, MD   40 mg at 02/07/22 1153   predniSONE (DELTASONE) tablet 5 mg  5 mg Oral Q breakfast Lacinda Axon, MD       rosuvastatin (CRESTOR) tablet 5 mg  5 mg Oral Daily Lacinda Axon, MD   5 mg at 02/07/22 1145    Allergies as of 02/06/2022 - Review Complete 02/06/2022  Allergen Reaction Noted   Dilaudid [hydromorphone hcl] Shortness Of Breath 09/19/2012   Gabapentin Other (See Comments) 06/05/2013   Latex Rash 09/19/2012   Lyrica [pregabalin] Other (See Comments) 06/05/2013   Oxycodone Shortness Of Breath and Other (See Comments) 02/17/2016   Singulair [montelukast] Shortness Of Breath and Other (See Comments) 09/07/2020   Ciprofloxacin hcl Nausea And Vomiting and Other (See Comments) 09/19/2012   Codeine Other (See Comments) 09/19/2012   Methadone Nausea And Vomiting and Other (See Comments) 09/19/2012   Metronidazole Nausea And Vomiting and Other (See Comments) 09/19/2012   Oysters [shellfish allergy] Other  (See Comments) 09/19/2012   Clindamycin/lincomycin Diarrhea and Nausea Only 07/01/2016   Donepezil Diarrhea and Other (See Comments) 03/16/2021   Sulfa antibiotics Diarrhea and Other (See Comments) 09/04/2014  Tape Other (See Comments) 03/16/2021   Zetia [ezetimibe] Diarrhea 03/16/2021   Elemental sulfur Nausea And Vomiting 09/19/2012   Iodine Rash 09/07/2020   Other Rash 07/15/2014   Oyster shell Rash 03/16/2021   Penicillin g Nausea And Vomiting and Rash 09/07/2020   Penicillins Nausea And Vomiting and Rash 09/19/2012   Povidone iodine Rash and Other (See Comments) 09/19/2012   Skintegrity hydrogel [skin protectants, misc.] Rash 09/07/2020   Tapentadol Other (See Comments) 02/06/2013    Family History  Problem Relation Age of Onset   Stroke Father     Social History   Socioeconomic History   Marital status: Divorced    Spouse name: Not on file   Number of children: Not on file   Years of education: Not on file   Highest education level: Not on file  Occupational History   Not on file  Tobacco Use   Smoking status: Former    Packs/day: 0.50    Years: 2.00    Pack years: 1.00    Types: Cigarettes    Quit date: 02/07/1992    Years since quitting: 30.0   Smokeless tobacco: Never  Vaping Use   Vaping Use: Never used  Substance and Sexual Activity   Alcohol use: No   Drug use: No   Sexual activity: Not on file  Other Topics Concern   Not on file  Social History Narrative   Not on file   Social Determinants of Health   Financial Resource Strain: Not on file  Food Insecurity: Not on file  Transportation Needs: Not on file  Physical Activity: Not on file  Stress: Not on file  Social Connections: Not on file  Intimate Partner Violence: Not on file    Review of Systems: Positive for: GI: Described in detail in HPI.    Gen: fatigue, weakness, malaise, involuntary weight loss,  denies any fever, chills, rigors, night sweats, anorexia, and sleep disorder CV:  Denies chest pain, angina, palpitations, syncope, orthopnea, PND, peripheral edema, and claudication. Resp: Denies dyspnea, cough, sputum, wheezing, coughing up blood. GU : Denies urinary burning, blood in urine, urinary frequency, urinary hesitancy, nocturnal urination, and urinary incontinence. 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  Psych: Denies depression, anxiety, memory loss, suicidal ideation, hallucinations,  and confusion. Heme: Denies bruising, bleeding, and enlarged lymph nodes. Neuro:  dizziness, denies any headaches,  paresthesias. Endo:  Denies any problems with DM, thyroid, adrenal function.  Physical Exam: Vital signs in last 24 hours: Temp:  [97.7 F (36.5 C)-100 F (37.8 C)] 100 F (37.8 C) (03/12 0819) Pulse Rate:  [78-139] 96 (03/12 0819) Resp:  [16-22] 22 (03/12 0819) BP: (93-125)/(50-73) 125/64 (03/12 0819) SpO2:  [92 %-98 %] 93 % (03/12 0819)    General:   Cachectic, elderly, ill-appearing Head:  Normocephalic and atraumatic. Eyes:  Sclera clear, no icterus.   Prominent pallor Ears:  Normal auditory acuity. Nose:  No deformity, discharge,  or lesions. Mouth:  No deformity or lesions.  Oropharynx pink & moist. Neck:  Supple; no masses or thyromegaly. Lungs:  Clear throughout to auscultation.   No wheezes, crackles, or rhonchi. No acute distress. Heart: Irregular rate, normal Extremities: Chronic skin changes , large hematoma with fluctuance noted on lower extremity  Neurologic:  Alert and  oriented x4;  grossly normal neurologically. Skin:  Intact without significant lesions or rashes. Psych:  Alert and cooperative. Normal mood and  affect. Abdomen:  Soft, nontender and nondistended. No masses, hepatosplenomegaly or hernias noted. Normal bowel sounds, without guarding, and without rebound.         Lab Results: Recent Labs     02/06/22 1239 02/07/22 0316  WBC 16.3* 15.0*  HGB 8.5* 9.7*  HCT 26.5* 29.2*  PLT 478* 436*   BMET Recent Labs    02/06/22 1239 02/07/22 0316  NA 132* 135  K 3.8 4.2  CL 98 103  CO2 22 24  GLUCOSE 103* 111*  BUN 24* 18  CREATININE 0.68 0.61  CALCIUM 8.3* 8.0*   LFT Recent Labs    02/06/22 1239  PROT 5.3*  ALBUMIN 2.1*  AST 26  ALT 20  ALKPHOS 79  BILITOT 0.6   PT/INR Recent Labs    02/07/22 1113  LABPROT 16.1*  INR 1.3*    Studies/Results: DG Chest 1 View  Result Date: 02/06/2022 CLINICAL DATA:  Fall.  Altered mental status. EXAM: CHEST  1 VIEW COMPARISON:  03/16/2021 and prior radiographs FINDINGS: UPPER limits normal heart size noted. Interstitial prominence/peribronchial thickening is again noted. There is no evidence of focal airspace disease, pulmonary edema, suspicious pulmonary nodule/mass, pleural effusion, or pneumothorax. No acute bony abnormalities are identified. Surgical changes within the cervical spine and RIGHT shoulder noted. IMPRESSION: No evidence of acute cardiopulmonary disease. Electronically Signed   By: Margarette Canada M.D.   On: 02/06/2022 13:09   CT HEAD WO CONTRAST (5MM)  Result Date: 02/06/2022 CLINICAL DATA:  81 year old female with head injury and altered mental status. EXAM: CT HEAD WITHOUT CONTRAST TECHNIQUE: Contiguous axial images were obtained from the base of the skull through the vertex without intravenous contrast. RADIATION DOSE REDUCTION: This exam was performed according to the departmental dose-optimization program which includes automated exposure control, adjustment of the mA and/or kV according to patient size and/or use of iterative reconstruction technique. COMPARISON:  12/22/2020 and prior CTs FINDINGS: Brain: No evidence of acute infarction, hemorrhage, hydrocephalus, extra-axial collection or mass lesion/mass effect. Atrophy, chronic small-vessel white matter ischemic changes and remote RIGHT cerebellar infarct are  identified. Vascular: Carotid and vertebral atherosclerotic calcifications are noted. Skull: Normal. Negative for fracture or focal lesion. Sinuses/Orbits: No acute finding. Other: None. IMPRESSION: 1. No evidence of acute intracranial abnormality. 2. Atrophy, chronic small-vessel white matter ischemic changes and remote RIGHT cerebellar infarct. Electronically Signed   By: Margarette Canada M.D.   On: 02/06/2022 13:14   DG Hip Unilat W or Wo Pelvis 2-3 Views Right  Result Date: 02/06/2022 CLINICAL DATA:  Acute RIGHT hip pain following fall. Initial encounter. EXAM: DG HIP (WITH OR WITHOUT PELVIS) 2-3V RIGHT COMPARISON:  08/13/2008 FINDINGS: There is no evidence of acute fracture or dislocation. Mild to moderate degenerative changes in both hips and LOWER lumbar spine again noted. No focal bony lesions are identified. IMPRESSION: 1. No evidence of acute abnormality. 2. Mild to moderate degenerative changes. Electronically Signed   By: Margarette Canada M.D.   On: 02/06/2022 13:11    Impression: Anemia, hemoglobin 9.7 today, was 8.5 on admission,  Status post 1 unit PRBC transfusion today normocytic,  Currently on pantoprazole 40 mg once a day  elevated platelet count of 478 on admission?  Reactive  Elevated BUN of 24 on admission, has decreased to 18 with a normal creatinine and GFR Iron panel compatible with chronic anemia, iron saturation 14%, ferritin 238, TIBC 155, unremarkable folate  History of ankylosing spondylitis, currently on prednisone 5 mg daily, and hydrocodone/acetaminophen 10/325 1 tablet  p.o. every 4 hours for severe to moderate pain as needed  A-fib with RVR-on amiodarone 200 mg twice daily, heart rate currently 96/min with a blood pressure of 125 / 64  Aortic stenosis  Lower extremity cellulitis, on IV ceftriaxone  Plan: Patient does not want to undergo EGD or anesthesia and is requesting for conservative management.  Recommend H&H monitoring and transfusion if needed. Continue  pantoprazole. Due to age, comorbidities and patient wanting to avoid anesthesia, do not plan endoscopy unless there is evidence of frank melena or active GI bleed.      LOS: 1 day   Ronnette Juniper, MD  02/07/2022, 3:16 PM

## 2022-02-07 NOTE — Consult Note (Addendum)
[facility]  [phone]   HEMATOLOGY ONCOLOGY INPATIENT CONSULTATION   Isabel Barnes  DOB: 1941/08/28  MR#: [id number]  CSN#: [id number]    Requesting Physician: [facility] IM resident and attending Dr. Antony Contras   Patient Care Team: Jamal Collin, Cordelia Poche as PCP - General (Physician Assistant) Iran Ouch, MD as PCP - Cardiology (Cardiology) Margarita Rana Orlene Plum as Physician Assistant (Physician Assistant)  Reason for consult: rule out bleeding disorder   History of present illness:   Patient is a 81 year old female with past medical history of CVA, hypertension, mild aortic stenosis, alkalosis spondylitis, iron deficiency, chronic venous insufficiency legs, presented to emergency room after a fall.  She was found to be hypotensive and in rapid A-fib, which is new to her.  She was found to have a large hematoma in the right low leg, patient also reports intermittent gum bleeding and nosebleeding lately.  Lab showed slightly elevated PT and PTT, the primary team is concerned about bleeding disorder, especially acquired VWD.   Patient lives alone, has been able to live independently, but it has been getting more challenging lately.  She had a multiple fall at home, reports unintentional 30 pound weight loss in the past several months, she had 2 episodes of melena a few months ago, no recurrent GI bleeding.  Appetite has been normal, no nausea, or abdominal pain.  MEDICAL HISTORY:  Past Medical History:  Diagnosis Date   Anemia    iron deficiency hx.has had iron infusions before    Chronic low back pain    Chronic pain disorder    Complication of anesthesia    severe claustrophobia   Constipation    r/t use of pain meds.Takes OTC meds or eats prunes   Depression    GERD (gastroesophageal reflux disease)    takes Omeprazole daily   Heart murmur    mild MS, moderate-severe AS 03/17/21 echo   History of bronchitis    20+ yrs ago   History of kidney  stones    3 surgerical removed, 1 passed   History of prolapse of bladder    History of shingles    Hypertension    takes Losartan daily   Hypothyroidism    takes Synthroid daily   Joint swelling    Neck pain    bone spurs at base of head per pt   Osteoarthritis    lumbar,cervical,joints   Pneumonia    hx of > 20 yrs ago   Shortness of breath    occasionally and with exertion. 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HCC)    Unsteady gait    occasionally   Urinary urgency     SURGICAL HISTORY: Past Surgical History:  Procedure Laterality Date   ABDOMINAL HYSTERECTOMY     ANTERIOR FUSION CERVICAL SPINE     x2 -C4-7   BUNIONECTOMY Bilateral    COLONOSCOPY     CYSTOSCOPY W/ URETEROSCOPY  2012   EYE SURGERY Bilateral    cataract /lens implant   HOLMIUM LASER APPLICATION Left 02/08/2013   Procedure: HOLMIUM LASER APPLICATION;  Surgeon: Anner Crete, MD;  Location: [facility];  Service: Urology;  Laterality: Left;   INSERTION OF MESH N/A 07/15/2014   Procedure: INSERTION OF MESH;  Surgeon: Ardeth Sportsman, MD;  Location: [facility];  Service: General;  Laterality: N/A;   JOINT REPLACEMENT Right 2012   shoulder   LAPAROSCOPIC  CHOLECYSTECTOMY W/ CHOLANGIOGRAPHY  2012   Dr Magnus Ivan   NASAL SINUS SURGERY     OPEN REDUCTION INTERNAL FIXATION (ORIF) DISTAL RADIAL FRACTURE Left 03/20/2021   Procedure: OPEN REDUCTION INTERNAL FIXATION (ORIF) DISTAL RADIAL FRACTURE;  Surgeon: Teryl Lucy, MD;  Location: [facility];  Service: Orthopedics;  Laterality: Left;   RADIOLOGY WITH ANESTHESIA N/A 05/09/2014   Procedure: ADULT SEDATION WITH ANESTHESIA/MRI CERVICAL SPINE WITHOUT CONTRAST;  Surgeon: Medication Radiologist, MD;  Location: [facility];  Service: Radiology;  Laterality: N/A;  DR. 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   Procedure: RIGHT TOTAL KNEE ARTHROPLASTY;  Surgeon: Jodi Geralds, MD;  Location: [facility];  Service: Orthopedics;  Laterality: Right;   UMBILICAL HERNIA REPAIR N/A 07/15/2014   Procedure: LAPAROSCOPIC UMBILICAL AND INFRAUMBILICAL HERNIA;  Surgeon: Ardeth Sportsman, MD;  Location: [facility];  Service: General;  Laterality: N/A;    SOCIAL HISTORY: Social History   Socioeconomic History   Marital status: Divorced    Spouse name: Not on file   Number of children: Not on file   Years of education: Not on file   Highest education level: Not on file  Occupational History   Not on file  Tobacco Use   Smoking status: Former    Packs/day: 0.50    Years: 2.00    Pack years: 1.00    Types: Cigarettes    Quit date: 02/07/1992    Years since quitting: 30.0   Smokeless tobacco: Never  Vaping Use   Vaping Use: Never used  Substance and Sexual Activity   Alcohol use: No   Drug use: No   Sexual activity: Not on file  Other Topics Concern   Not on file  Social History Narrative   Not on file   Social Determinants of Health   Financial Resource Strain: Not on file  Food Insecurity: Not on file  Transportation Needs: Not on file  Physical Activity: Not on file  Stress: Not on file  Social Connections: Not on file  Intimate Partner Violence: Not on file    FAMILY HISTORY: Family History  Problem Relation Age of Onset   Stroke Father     ALLERGIES:  is allergic to dilaudid [hydromorphone hcl]; gabapentin; latex; lyrica [pregabalin]; oxycodone; singulair [montelukast]; ciprofloxacin hcl; codeine; methadone; metronidazole; oysters [shellfish allergy]; clindamycin/lincomycin; donepezil; sulfa antibiotics; tape; zetia [ezetimibe]; elemental sulfur; iodine; other; oyster shell; penicillin g; penicillins; povidone iodine; skintegrity hydrogel [skin protectants, misc.]; and tapentadol.  MEDICATIONS:  Current Facility-Administered  Medications  Medication Dose Route Frequency Provider Last Rate Last Admin   0.9 %  sodium chloride infusion (Manually program via Guardrails IV Fluids)   Intravenous Once Steffanie Rainwater, MD       acetaminophen (TYLENOL) tablet 1,000 mg  1,000 mg Oral TID Steffanie Rainwater, MD       amiodarone (PACERONE) tablet 200 mg  200 mg Oral BID Duke Salvia, MD   200 mg at 02/07/22 1303   cefTRIAXone (ROCEPHIN) 2 g in sodium chloride 0.9 % 100 mL IVPB  2 g Intravenous Q24H Reymundo Poll, MD 200 mL/hr at 02/07/22 1828 2 g at 02/07/22 1828   cycloSPORINE (RESTASIS) 0.05 % ophthalmic emulsion 1 drop  1 drop Both Eyes Daily Steffanie Rainwater, MD   1 drop at 02/07/22 1146  HYDROcodone-acetaminophen (NORCO) 10-325 MG per tablet 1 tablet  1 tablet Oral Q4H PRN Steffanie Rainwater, MD   1 tablet at 02/07/22 1827   methocarbamol (ROBAXIN) tablet 500 mg  500 mg Oral TID PRN Steffanie Rainwater, MD   500 mg at 02/07/22 1146   pantoprazole (PROTONIX) EC tablet 40 mg  40 mg Oral Daily Steffanie Rainwater, MD   40 mg at 02/07/22 1153   predniSONE (DELTASONE) tablet 5 mg  5 mg Oral Q breakfast Steffanie Rainwater, MD       rosuvastatin (CRESTOR) tablet 5 mg  5 mg Oral Daily Steffanie Rainwater, MD   5 mg at 02/07/22 1145    REVIEW OF SYSTEMS:   Constitutional: Denies fevers, chills or abnormal night sweats, (+) fatigue and weight loss  Eyes: Denies blurriness of vision, double vision or watery eyes Ears, nose, mouth, throat, and face: Denies mucositis or sore throat Respiratory: Denies cough, dyspnea or wheezes Cardiovascular: Denies palpitation, chest discomfort or lower extremity swelling Gastrointestinal:  see HPI  Skin: Denies abnormal skin rashes Lymphatics: Denies new lymphadenopathy or easy bruising Neurological:Denies numbness, tingling or new weaknesses, (+) frequent fall at home  Behavioral/Psych: Mood is stable, no new changes  All other systems were reviewed with the patient and are  negative.  PHYSICAL EXAMINATION: ECOG PERFORMANCE STATUS: 2 - Symptomatic, <50% confined to bed  Vitals:   02/07/22 1803 02/07/22 2001  BP: 121/60 (!) 110/53  Pulse: 81 79  Resp: 20 16  Temp: 97.9 F (36.6 C) 98.4 F (36.9 C)  SpO2: 97% 96%   Filed Weights   02/06/22 1205  Weight: 109 lb (49.4 kg)    GENERAL:alert, no distress and comfortable SKIN: skin color, texture, turgor are normal, no rashes or significant lesions EYES: normal, conjunctiva are pink and non-injected, sclera clear OROPHARYNX:no exudate, no erythema and lips, buccal mucosa, and tongue normal  NECK: supple, thyroid normal size, non-tender, without nodularity LYMPH:  no palpable lymphadenopathy in the cervical, axillary or inguinal LUNGS: clear to auscultation and percussion with normal breathing effort HEART: regular rate & rhythm and no murmurs and no lower extremity edema ABDOMEN:abdomen soft, non-tender and normal bowel sounds Musculoskeletal:no cyanosis of digits and no clubbing, (+) chronic bilateral lower extremity lymphedema, dry skin, and a large hematoma in the right leg with ecchymosis PSYCH: alert & oriented x 3 with fluent speech NEURO: no focal motor/sensory deficits Breast exam was unremarkable  LABORATORY DATA:  I have reviewed the data as listed Lab Results  Component Value Date   WBC 15.0 (H) 02/07/2022   HGB 9.7 (L) 02/07/2022   HCT 29.2 (L) 02/07/2022   MCV 80.7 02/07/2022   PLT 436 (H) 02/07/2022   Recent Labs    03/16/21 1800 02/06/22 1239 02/07/22 0316  NA 142 132* 135  K 3.8 3.8 4.2  CL 110 98 103  CO2 24 22 24   GLUCOSE 102* 103* 111*  BUN 12 24* 18  CREATININE 0.61 0.68 0.61  CALCIUM 8.9 8.3* 8.0*  GFRNONAA >60 >60 >60  PROT  --  5.3*  --   ALBUMIN  --  2.1*  --   AST  --  26  --   ALT  --  20  --   ALKPHOS  --  79  --   BILITOT  --  0.6  --     RADIOGRAPHIC STUDIES: I have personally reviewed the radiological images as listed and agreed with the findings in  the report.  DG Chest 1 View  Result Date: 02/06/2022 CLINICAL DATA:  Fall.  Altered mental status. EXAM: CHEST  1 VIEW COMPARISON:  03/16/2021 and prior radiographs FINDINGS: UPPER limits normal heart size noted. Interstitial prominence/peribronchial thickening is again noted. There is no evidence of focal airspace disease, pulmonary edema, suspicious pulmonary nodule/mass, pleural effusion, or pneumothorax. No acute bony abnormalities are identified. Surgical changes within the cervical spine and RIGHT shoulder noted. IMPRESSION: No evidence of acute cardiopulmonary disease. Electronically Signed   By: Harmon Pier M.D.   On: 02/06/2022 13:09   CT HEAD WO CONTRAST ( )  Result Date: 02/06/2022 CLINICAL DATA:  81 year old female with head injury and altered mental status. EXAM: CT HEAD WITHOUT CONTRAST TECHNIQUE: Contiguous axial images were obtained from the base of the skull through the vertex without intravenous contrast. RADIATION DOSE REDUCTION: This exam was performed according to the departmental dose-optimization program which includes automated exposure control, adjustment of the mA and/or kV according to patient size and/or use of iterative reconstruction technique. COMPARISON:  12/22/2020 and prior CTs FINDINGS: Brain: No evidence of acute infarction, hemorrhage, hydrocephalus, extra-axial collection or mass lesion/mass effect. 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 Vascular: Carotid and vertebral atherosclerotic calcifications are noted. Skull: Normal. Negative for fracture or focal lesion. Sinuses/Orbits: No acute finding. Other: None. IMPRESSION: 1. No evidence of acute intracranial abnormality. 2. Atrophy, chronic small-vessel white matter ischemic changes and remote RIGHT cerebellar infarct. Electronically Signed   By: Harmon Pier M.D.   On: 02/06/2022 13:14   [place] RT LOWER EXTREM LTD SOFT TISSUE NON VASCULAR  Result  Date: 02/07/2022 CLINICAL DATA:  Concern for hematoma versus abscess lower extremity EXAM: ULTRASOUND RIGHT LOWER EXTREMITY LIMITED TECHNIQUE: Ultrasound examination of the lower extremity soft tissues was performed in the area of clinical concern. COMPARISON:  None. FINDINGS: Extensive edema within the right medial calf in the area of concern. No well-defined wall to suggest abscess. IMPRESSION: Extensive edema within the subcutaneous soft tissues in the area of concern without well-defined abscess. Electronically Signed   By: Charlett Nose M.D.   On: 02/07/2022 19:14   DG Hip Unilat W or Wo Pelvis 2-3 Views Right  Result Date: 02/06/2022 CLINICAL DATA:  Acute RIGHT hip pain following fall. Initial encounter. EXAM: DG HIP (WITH OR WITHOUT PELVIS) 2-3V RIGHT COMPARISON:  08/13/2008 FINDINGS: There is no evidence of acute fracture or dislocation. Mild to moderate degenerative changes in both hips and LOWER lumbar spine again noted. No focal bony lesions are identified. IMPRESSION: 1. No evidence of acute abnormality. 2. Mild to moderate degenerative changes. Electronically Signed   By: Harmon Pier M.D.   On: 02/06/2022 13:11   VAS [place] LOWER EXTREMITY VENOUS (DVT)  Result Date: 02/07/2022  Lower Venous DVT Study Patient Name:  ELLARAE LANDIS  Date of Exam:   02/07/2022 Medical Rec #: [id number]          Accession #:    [id number] Date of Birth: [birth year]           Patient Gender: F Patient Age:   70 years Exam Location:  [facility] Procedure:      VAS [place] LOWER EXTREMITY VENOUS (DVT) Referring Phys: Sherryl Manges --------------------------------------------------------------------------------  Indications: Pain, Swelling, and Erythema. Other Indications: Sepsis secondary to cellulitis/abscess on right calf. Risk Factors: Patient has moderate to severe Aortic stenosis. Limitations: Pain, skin texture, edema. Comparison Study: Prior negative right LEV done 08/28/2015 Performing Technologist: Sherren Kerns  RVS  Examination Guidelines: A complete evaluation includes B-mode imaging, spectral Doppler, color Doppler, and power Doppler as needed of all accessible portions of each vessel. Bilateral testing is considered an integral part of a complete examination. Limited examinations for reoccurring indications may be performed as noted. The reflux portion of the exam is performed with the patient in reverse Trendelenburg.  +---------+---------------+---------+-----------+---------------+--------------+ RIGHT    CompressibilityPhasicitySpontaneityProperties     Thrombus Aging +---------+---------------+---------+-----------+---------------+--------------+ CFV                                         pulsatile                                                                 waveforms                     +---------+---------------+---------+-----------+---------------+--------------+ FV Prox                                     pulsatile                                                                 waveforms                     +---------+---------------+---------+-----------+---------------+--------------+ FV Mid                                      pulsatile                                                                 waveforms                     +---------+---------------+---------+-----------+---------------+--------------+ FV Distal                                   pulsatile                                                                 waveforms                     +---------+---------------+---------+-----------+---------------+--------------+ PFV  pulsatile                                                                 waveforms                     +---------+---------------+---------+-----------+---------------+--------------+ POP                                         pulsatile                                                                  waveforms                     +---------+---------------+---------+-----------+---------------+--------------+ PTV                                                        Not well                                                                  visualized     +---------+---------------+---------+-----------+---------------+--------------+ PERO                                                       Not well                                                                  visualized     +---------+---------------+---------+-----------+---------------+--------------+   +---------+---------------+---------+-----------+---------------+--------------+ LEFT     CompressibilityPhasicitySpontaneityProperties     Thrombus Aging +---------+---------------+---------+-----------+---------------+--------------+ CFV      Full                               pulsatile                                                                 waveforms                     +---------+---------------+---------+-----------+---------------+--------------+  SFJ      Full                                                             +---------+---------------+---------+-----------+---------------+--------------+ FV Prox  Full                                                             +---------+---------------+---------+-----------+---------------+--------------+ FV Mid   Full                                                             +---------+---------------+---------+-----------+---------------+--------------+ FV DistalFull                               pulsatile                                                                 waveforms                     +---------+---------------+---------+-----------+---------------+--------------+ PFV                                                        Not well                                                                   visualized     +---------+---------------+---------+-----------+---------------+--------------+ POP                                         pulsatile                                                                 waveforms                     +---------+---------------+---------+-----------+---------------+--------------+ PTV  Not well                                                                  visualized     +---------+---------------+---------+-----------+---------------+--------------+ PERO                                                       Not well                                                                  visualized     +---------+---------------+---------+-----------+---------------+--------------+    Summary: RIGHT: - There is no evidence of deep vein thrombosis in the lower extremity. However, portions of this examination were limited- see technologist comments above.  - No cystic structure found in the popliteal fossa. - Ultrasound characteristics of enlarged lymph nodes are noted in the groin. pulsatile waveforms throughout, suggestive of fluid overload  LEFT: - There is no evidence of deep vein thrombosis in the lower extremity. However, portions of this examination were limited- see technologist comments above.  - No cystic structure found in the popliteal fossa. - Ultrasound characteristics of enlarged lymph nodes noted in the groin. pulsatile waveforms throughout, suggestive of fluid overload.  *See table(s) above for measurements and observations.    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  I think her right lower extremity hematoma is probably related to her fall -I do not have high suspicion for bleeding disorder, PT and APP only mildly elevated, will give one dose vitK, and repeat PT in 2 days. I will obtain PTT mixing study -VWD panel has been ordered, result pending, I do not recommend more extensive bleeding workup  -I am more concerned about her weight loss and recent mild GI bleeding.  I discussed the work-up, such as CT scan and endoscopy.  However patient clearly indicated that that she was not interested in finding the underline causes, and will not want any treatment even if she has cancer. She is somewhat depressed, and is not afraid of dying, and wants to die soon. In this setting, I would not peruse any malignancy work-up. -Patient lives alone, has frequent falls at home, will need home care on discharge.  She is open to hospice if she is eligible. I will check tumor marker CEA and CA19.9 to see if any suspicion for GI malignancy, which may make her eligible for hospice. -anemia: Iron  panel is more consistent with anemia of chronic disease, I will repeat folic acid and B12 level.  He does not need blood transfusion at this point. -will f/u as needed.    All questions were answered. The patient knows to call the clinic with any problems, questions or concerns.      Malachy Mood, MD 02/07/2022 8:40 PM

## 2022-02-07 NOTE — Progress Notes (Signed)
Subjective:   Hospital day:1   Overnight event: Patient received 1 unit of blood overnight.  Patient evaluated at the bedside laying comfortably in bed.  Patient reports continue pain in her right lower extremity. States her pain has not responded to the [place] and had been in pain while at home.  She reports seeing wound care for her right lower extremity wounds.  She continues to report feeling weak with occasional dizziness.  Patient also reports she easily bruises and occasionally notices some blood after brushing her teeth. She denies any fevers, nausea, vomiting or chills.  Objective:  Vital signs in last 24 hours: Vitals:   02/06/22 2230 02/06/22 2245 02/06/22 2352 02/07/22 0354  BP: (!) 109/53 (!) 114/50 (!) 112/57 122/73  Pulse: 85 79 82 100  Resp: 16 16 18 20   Temp: 97.8 F (36.6 C) 97.7 F (36.5 C) 98.1 F (36.7 C) 98.8 F (37.1 C)  TempSrc: Oral Oral Oral Oral  SpO2: 96% 97% 98% 92%  Weight:      Height:        Filed Weights   02/06/22 1205  Weight: 49.4 kg     Intake/Output Summary (Last 24 hours) at 02/07/2022 [id number] Last data filed at 02/07/2022 0355 Gross per 24 hour  Intake 634.16 ml  Output 0 ml  Net 634.16 ml   Net IO Since Admission: 634.16 mL [02/07/22 0619]  No results for input(s): GLUCAP in the last 72 hours.   Pertinent Labs: CBC Latest Ref Rng & Units 02/07/2022 02/06/2022 03/16/2021  WBC 4.0 - 10.5 K/uL 15.0(H) 16.3(H) 7.3  Hemoglobin 12.0 - 15.0 g/dL 9.7(L) 8.5(L) 10.7(L)  Hematocrit 36.0 - 46.0 % 29.2(L) 26.5(L) 34.2(L)  Platelets 150 - 400 K/uL 436(H) 478(H) 197    CMP Latest Ref Rng & Units 02/07/2022 02/06/2022 03/16/2021  Glucose 70 - 99 mg/dL 111(H) 103(H) 102(H)  BUN 8 - 23 mg/dL 18 24(H) 12  Creatinine 0.44 - 1.00 mg/dL 0.61 0.68 0.61  Sodium 135 - 145 mmol/L 135 132(L) 142  Potassium 3.5 - 5.1 mmol/L 4.2 3.8 3.8  Chloride 98 - 111 mmol/L 103 98 110  CO2 22 - 32 mmol/L 24 22 24   Calcium 8.9 - 10.3 mg/dL 8.0(L) 8.3(L) 8.9  Total  Protein 6.5 - 8.1 g/dL - 5.3(L) -  Total Bilirubin 0.3 - 1.2 mg/dL - 0.6 -  Alkaline Phos 38 - 126 U/L - 79 -  AST 15 - 41 U/L - 26 -  ALT 0 - 44 U/L - 20 -    Imaging: DG Chest 1 View  Result Date: 02/06/2022 CLINICAL DATA:  Fall.  Altered mental status. EXAM: CHEST  1 VIEW COMPARISON:  03/16/2021 and prior radiographs FINDINGS: UPPER limits normal heart size noted. Interstitial prominence/peribronchial thickening is again noted. There is no evidence of focal airspace disease, pulmonary edema, suspicious pulmonary nodule/mass, pleural effusion, or pneumothorax. No acute bony abnormalities are identified. Surgical changes within the cervical spine and RIGHT shoulder noted. IMPRESSION: No evidence of acute cardiopulmonary disease. Electronically Signed   By: Margarette Canada M.D.   On: 02/06/2022 13:09   CT HEAD WO CONTRAST (5MM)  Result Date: 02/06/2022 CLINICAL DATA:  81 year old female with head injury and altered mental status. EXAM: CT HEAD WITHOUT CONTRAST TECHNIQUE: Contiguous axial images were obtained from the base of the skull through the vertex without intravenous contrast. RADIATION DOSE REDUCTION: This exam was performed according to the departmental dose-optimization program which includes automated exposure control, adjustment of the mA and/or  kV according to patient size and/or use of iterative reconstruction technique. COMPARISON:  12/22/2020 and prior CTs FINDINGS: Brain: No evidence of acute infarction, hemorrhage, hydrocephalus, extra-axial collection or mass lesion/mass effect. 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 Vascular: Carotid and vertebral atherosclerotic calcifications are noted. Skull: Normal. Negative for fracture or focal lesion. Sinuses/Orbits: No acute finding. Other: None. IMPRESSION: 1. No evidence of acute intracranial abnormality. 2. Atrophy, chronic small-vessel white matter ischemic changes and  remote RIGHT cerebellar infarct. Electronically Signed   By: Margarette Canada M.D.   On: 02/06/2022 13:14   DG Hip Unilat W or Wo Pelvis 2-3 Views Right  Result Date: 02/06/2022 CLINICAL DATA:  Acute RIGHT hip pain following fall. Initial encounter. EXAM: DG HIP (WITH OR WITHOUT PELVIS) 2-3V RIGHT COMPARISON:  08/13/2008 FINDINGS: There is no evidence of acute fracture or dislocation. Mild to moderate degenerative changes in both hips and LOWER lumbar spine again noted. No focal bony lesions are identified. IMPRESSION: 1. No evidence of acute abnormality. 2. Mild to moderate degenerative changes. Electronically Signed   By: Margarette Canada M.D.   On: 02/06/2022 13:11    Physical Exam  General: Pleasant, cachectic and chronically ill elderly woman laying in bed.  Mild distress. HEENT: Moist mucous membrane. No evidence of bleed of oral mucosa. CV: Regular rate and rhythm. IV/VI holosystolic murmur.  No rubs or gallops. 1-2+ BLE edema Pulmonary: Lungs CTAB. Normal effort. No wheezing or rales. Abdominal: Soft, nontender, nondistended. Normal bowel sounds. Extremities: Palpable radial and DP pulses. BLE edema, R>L. 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 RLE with ecchymosis and healing eschar on the calf that is moderately tender to palpation.  Multiple joint deformities and OA nodules on PIPs. Skin: Warm and dry.  Neuro: Sleepy but answers questions appropriately.  Moves all extremities.  Normal sensation to gross touch Psych: Normal mood and affect   Assessment/Plan: Isabel Barnes is a 81 y.o. female with hx of cerebellar stroke, iron deficiency anemia, GERD, HTN, hypothyroidism, aortic stenosis, ankylosing spondylitis, venous insufficiency and osteoarthritis who presented to the ED after a fall and found to be in A-fib likely secondary to acute infection/cellulitis of the lower extremity.  Principal Problem:   Cellulitis of right lower  extremity  #Cellulitis #Ecchymosis Patient continues to have significant pain from the right lower extremity. Right lower extremity wounds with some fluctuance concerning for possible hematoma versus abscess.  --Follow-up US RLE soft tissue and LE U/S --Continue IV Rocephin --Scheduled Tylenol 1000 mg 3 times daily  --Increase Norco 10-325 mg frequency to q4h prn for moderate, severe pain --Continue Robaxin 500 mg 3 times daily prn for muscle spasm  Moderate to severe aortic stenosis GI bleed Acquired von Willebrand disease? Patient with a history of aortic stenosis I was found to be worsened on echo on 03/17/21. Endorse fatigue but denied any dyspnea on exertion or recent syncopes.  Patient has also had chronic blood loss as well as recent melena.  Patient also endorse some gum bleeds while brushing teeth. She denies any nosebleeds. PT/INR and APTT elevated today. Patient's history of cutaneous bleeding as well as severe aortic stenosis Place patient on a high risk of developing acquired von Willebrand's disease Patient would likely need endoscopy/colonoscopy to rule out GI bleed. It is possible patient could have Heyde's syndrome if angiodysplasia is found after GI evaluation.  --GI consult, appreciate rec --Hematology consult, appreciate rec --Von Willebrand's panel --Follow-up repeat echo  #  New A-fib Patient presented with new A-fib with a rate up to 150. Patient started on amnio drip has now converted to normal sinus. Heart rate now in the 70s to 90s. Patient now on p.o. amio. --Continue amiodarone 200 mg twice daily --Telemetry  IDA Normocytic anemia Iron studies showed normal ferritin, mildly low iron and normal iron saturation.  Patient received 1 unit PRBCs with improvement of hemoglobin to 9.7 from 8.5.  Baseline hemoglobin around 11. --GI evaluation as above --Daily CBC  #Ankylosing spondylitis #Chronic back pain #Osteoarthritis Patient has had chronic back, shoulder and  knee pain. Vitamin D levels within normal limits. --Pain control as above. --Continue prednisone 5 mg daily  #HTN Hypotensive on admission.  BP improved with SBP in the 120s. -- Continue to hold home Cozaar 25 mg.   #GERD Continue home Protonix   Diet: Regular IVF: None VTE: None (possible GI bleed CODE: DNR  Prior to Admission Living Arrangement: Home Anticipated Discharge Location: SNF Barriers to Discharge: Medical stability Dispo: Anticipated discharge in approximately 1-2 day(s).   Signed: Lacinda Axon, MD 02/07/2022, 6:19 AM  Pager: [phone] Internal Medicine Teaching Service After 5pm on weekdays and 1pm on weekends: On Call pager: [phone]

## 2022-02-07 NOTE — Progress Notes (Signed)
VASCULAR LAB ? ? ? ?Bilateral lower extremity venous duplex has been performed. ? ?See CV proc for preliminary results. ? ? ?Courteny Egler, RVT ?02/07/2022, 5:51 PM ? ?

## 2022-02-07 NOTE — Evaluation (Signed)
Physical Therapy Evaluation ?Patient Details ?Name: Isabel Barnes ?MRN: [id number] ?DOB: 02-Jun-1941 ?Today's Date: 02/07/2022 ? ?History of Present Illness ? Pt is a 81 y.o. F who presents 02/06/2022 after a fall. CT head negative for acute abnormality. Hip x-ray negative for acute fracture. Found to have new a-fib in setting of moderate to severe AS. Significant PMH: cerebellar stroke, anemia, HTN, aortic stenosis, ankylosing spondylitis, venous insufficiency.  ?Clinical Impression ? PTA, pt lives alone, uses a walker vs cane and is independent with ADL's. History of recurrent falls. Pt presents with decreased functional mobility secondary to pain, decreased skin integrity, impaired balance, generalized weakness and decreased activity tolerance. Pt requiring min assist for bed mobility/transfers to standing using a walker. Unable to transfer to the chair or ambulate due to sharp increase in distal RLE pain. Returned pt to supine position and notified RN. Pt presents as a high fall risk based on deficits listed above. In light of this and decreased caregiver support, recommend SNF. ?   ? ?Recommendations for follow up therapy are one component of a multi-disciplinary discharge planning process, led by the attending physician.  Recommendations may be updated based on patient status, additional functional criteria and insurance authorization. ? ?Follow Up Recommendations Skilled nursing-short term rehab (<3 hours/day) ? ?  ?Assistance Recommended at Discharge Frequent or constant Supervision/Assistance  ?Patient can return home with the following ? A little help with walking and/or transfers;A little help with bathing/dressing/bathroom;Assistance with cooking/housework;Assist for transportation;Help with stairs or ramp for entrance ? ?  ?Equipment Recommendations BSC/3in1  ?Recommendations for Other Services ?    ?  ?Functional Status Assessment Patient has had a recent decline in their functional status and  demonstrates the ability to make significant improvements in function in a reasonable and predictable amount of time.  ? ?  ?Precautions / Restrictions Precautions ?Precautions: Fall ?Restrictions ?Weight Bearing Restrictions: No  ? ?  ? ?Mobility ? Bed Mobility ?Overal bed mobility: Needs Assistance ?Bed Mobility: Supine to Sit, Sit to Supine ?  ?  ?Supine to sit: Min guard ?Sit to supine: Min assist ?  ?General bed mobility comments: Pt exiting right side of bed without physical assist. Increased time and effort, use of bed rail. MinA for LE negotiation back into bed ?  ? ?Transfers ?Overall transfer level: Needs assistance ?Equipment used: Rolling walker (2 wheels) ?Transfers: Sit to/from Stand ?Sit to Stand: Min assist ?  ?  ?  ?  ?  ?General transfer comment: MinA to power up from edge of bed, increased trunk/hip flexion, able to take side steps up to head of bed ?  ? ?Ambulation/Gait ?  ?  ?  ?  ?  ?  ?  ?  ? ?Stairs ?  ?  ?  ?  ?  ? ?Wheelchair Mobility ?  ? ?Modified Rankin (Stroke Patients Only) ?  ? ?  ? ?Balance Overall balance assessment: Needs assistance ?Sitting-balance support: Feet supported ?Sitting balance-Leahy Scale: Fair ?  ?  ?Standing balance support: Bilateral upper extremity supported ?Standing balance-Leahy Scale: Poor ?Standing balance comment: reliant on RW ?  ?  ?  ?  ?  ?  ?  ?  ?  ?  ?  ?   ? ? ? ?Pertinent Vitals/Pain Pain Assessment ?Pain Assessment: Faces ?Faces Pain Scale: Hurts worst ?Pain Location: distal RLE, bilateral hips ?Pain Descriptors / Indicators: Shooting, Sharp, Burning ?Pain Intervention(s): Limited activity within patient's tolerance, Monitored during session, Patient requesting pain meds-RN notified  ? ? ?  Home Living Family/patient expects to be discharged to:: Private residence ?Living Arrangements: Alone ?Available Help at Discharge: Friend(s) ?Type of Home: House ?Home Access: Stairs to enter ?Entrance Stairs-Rails: None (pending railing placement front  entrance) ?Entrance Stairs-Number of Steps: 2 ?  ?Home Layout: One level ?Home Equipment: Shower seat - built Medical sales representative (2 wheels);Cane - single point ?   ?  ?Prior Function Prior Level of Function : Needs assist;Driving ?  ?  ?  ?  ?  ?  ?Mobility Comments: drives to grocery store "when necessary," uses walker vs cane ?ADLs Comments: independent ADL's, has aide 2x/wk for household chores, uses pill box ?  ? ? ?Hand Dominance  ?   ? ?  ?Extremity/Trunk Assessment  ? Upper Extremity Assessment ?Upper Extremity Assessment: Defer to OT evaluation ?  ? ?Lower Extremity Assessment ?Lower Extremity Assessment: Generalized weakness;RLE deficits/detail;LLE deficits/detail ?RLE Deficits / Details: Severe xeroderma, discoloration/erythema distally, medial shin necrotic wound with associated edema ?LLE Deficits / Details: Severe xeroderma, discoloration/erythema distally ?  ? ?Cervical / Trunk Assessment ?Cervical / Trunk Assessment: Kyphotic  ?Communication  ? Communication: No difficulties  ?Cognition Arousal/Alertness: Awake/alert ?Behavior During Therapy: [facility] for tasks assessed/performed ?Overall Cognitive Status: Impaired/Different from baseline ?Area of Impairment: Memory ?  ?  ?  ?  ?  ?  ?  ?  ?  ?  ?Memory: Decreased short-term memory ?  ?  ?  ?  ?General Comments: A&Ox4, STM deficits ?  ?  ? ?  ?General Comments General comments (skin integrity, edema, etc.): VSS ? ?  ?Exercises    ? ?Assessment/Plan  ?  ?PT Assessment Patient needs continued PT services  ?PT Problem List Decreased strength;Decreased activity tolerance;Decreased balance;Decreased mobility;Decreased cognition;Pain ? ?   ?  ?PT Treatment Interventions DME instruction;Gait training;Therapeutic activities;Functional mobility training;Therapeutic exercise;Balance training;Patient/family education   ? ?PT Goals (Current goals can be found in the Care Plan section)  ?Acute Rehab PT Goals ?Patient Stated Goal: less pain ?PT Goal Formulation: With  patient ?Time For Goal Achievement: 02/21/22 ?Potential to Achieve Goals: Fair ? ?  ?Frequency Min 3X/week ?  ? ? ?Co-evaluation   ?  ?  ?  ?  ? ? ?  ?AM-PAC PT "6 Clicks" Mobility  ?Outcome Measure Help needed turning from your back to your side while in a flat bed without using bedrails?: A Little ?Help needed moving from lying on your back to sitting on the side of a flat bed without using bedrails?: A Little ?Help needed moving to and from a bed to a chair (including a wheelchair)?: A Little ?Help needed standing up from a chair using your arms (e.g., wheelchair or bedside chair)?: A Little ?Help needed to walk in hospital room?: Total ?Help needed climbing 3-5 steps with a railing? : Total ?6 Click Score: 14 ? ?  ?End of Session Equipment Utilized During Treatment: Gait belt ?Activity Tolerance: Patient limited by pain ?Patient left: in bed;with call bell/phone within reach;with bed alarm set ?Nurse Communication: Mobility status ?PT Visit Diagnosis: Unsteadiness on feet (R26.81);Muscle weakness (generalized) (M62.81);History of falling (Z91.81) ?  ? ?Time: [id number] ?PT Time Calculation (min) (ACUTE ONLY): 25 min ? ? ?Charges:   PT Evaluation ?$PT Eval Moderate Complexity: 1 Mod ?PT Treatments ?$Therapeutic Activity: 8-22 mins ?  ?   ? ? ?Wyona Almas, PT, DPT ?Acute Rehabilitation Services ?Pager [phone] ?Office [phone] ? ? ?Carloine Margo Aye ?02/07/2022, 12:44 PM ? ?

## 2022-02-07 NOTE — Progress Notes (Signed)
RLE [place] resulted with extensive subcutaneous edema without definite fluid collection. Doppler was negative for DVT but showed evidence of enlarged groin adenopathy. Although this could be reactive from infection, I now worry her focal RLE soft tissue enlargement with bruising might represent a mass or sarcoma. Will proceed with MRI given non diagnostic ultrasound. Malignancy could also explain her unintentional weight loss and thrombocyt osis. I believe acquired von willibrands can be seen with malignancy as well.

## 2022-02-07 NOTE — Evaluation (Signed)
Occupational Therapy Evaluation ?Patient Details ?Name: Isabel Barnes ?MRN: [id number] ?DOB: 1941-09-26 ?Today's Date: 02/07/2022 ? ? ?History of Present Illness Pt is a 81 y.o. F who presents 02/06/2022 after a fall. CT head negative for acute abnormality. Hip x-ray negative for acute fracture. Found to have new a-fib in setting of moderate to severe AS. Significant PMH: cerebellar stroke, anemia, HTN, aortic stenosis, ankylosing spondylitis, venous insufficiency.  ? ?Clinical Impression ?  ?Fraser Din was evaluated s/p the above admission list, she is generally mod I at baseline, with report of multiple falls. She lives alone in a 1 level home. Upon evaluation she was minG to get OOB, and mod A to get back into bed. Pt sat EOB for ~15 minutes completing sitting EOB with min A for pain management. Pt declined OOB this date. Pt limited by pain, STM deficits, poor activity tolerance and generalized weakness. Pt will benefit from OT acutely. Recommend d/c to SNF for safety and continued therapy.   ? ?Recommendations for follow up therapy are one component of a multi-disciplinary discharge planning process, led by the attending physician.  Recommendations may be updated based on patient status, additional functional criteria and insurance authorization.  ? ?Follow Up Recommendations ? Skilled nursing-short term rehab (<3 hours/day)  ?  ?Assistance Recommended at Discharge Frequent or constant Supervision/Assistance  ?Patient can return home with the following A little help with walking and/or transfers;A little help with bathing/dressing/bathroom;Assistance with cooking/housework;Direct supervision/assist for medications management;Assist for transportation;Help with stairs or ramp for entrance ? ?  ?Functional Status Assessment ? Patient has had a recent decline in their functional status and demonstrates the ability to make significant improvements in function in a reasonable and predictable amount of time.  ?Equipment  Recommendations ? Other (comment) (defer)  ?  ?   ?Precautions / Restrictions Precautions ?Precautions: Fall ?Restrictions ?Weight Bearing Restrictions: No  ? ?  ? ?Mobility Bed Mobility ?Overal bed mobility: Needs Assistance ?Bed Mobility: Supine to Sit, Sit to Supine ?  ?  ?Supine to sit: Min guard ?Sit to supine: Mod assist ?  ?General bed mobility comments: exited toward the L without assist. mod A to manage BLE back into bed after prolonged sitting ?  ? ?Transfers ?Overall transfer level: Needs assistance ?  ?  ?  ?  ?  ?  ?  ?  ?General transfer comment: pt declined. ?  ? ?  ?Balance Overall balance assessment: Needs assistance ?Sitting-balance support: Feet supported ?Sitting balance-Leahy Scale: Fair ?  ?  ?  ?  ?  ?  ?  ?  ?  ?  ?  ?  ?  ?  ?  ?  ?   ? ?ADL either performed or assessed with clinical judgement  ? ?ADL Overall ADL's : Needs assistance/impaired ?Eating/Feeding: Independent;Sitting ?  ?Grooming: Set up;Sitting ?Grooming Details (indicate cue type and reason): oral hyeine completed EOB ?Upper Body Bathing: Minimal assistance;Sitting ?  ?Lower Body Bathing: Maximal assistance;Sit to/from stand ?  ?Upper Body Dressing : Set up;Sitting ?  ?Lower Body Dressing: Maximal assistance;Sit to/from stand ?  ?Toilet Transfer: Moderate assistance;Ambulation;Rolling walker (2 wheels) ?  ?Toileting- Clothing Manipulation and Hygiene: Minimal assistance;Sitting/lateral lean ?  ?  ?  ?  ?General ADL Comments: limited by pain  ? ? ? ?Vision Baseline Vision/History: 0 No visual deficits ?Ability to See in Adequate Light: 0 Adequate ?Patient Visual Report: No change from baseline ?Vision Assessment?: No apparent visual deficits  ?   ?Perception   ?  ?  Praxis   ?  ? ?Pertinent Vitals/Pain Pain Assessment ?Pain Assessment: 0-10 ?Faces Pain Scale: Hurts whole lot ?Pain Location: distal RLE, bilateral hips ?Pain Descriptors / Indicators: Shooting, Sharp, Burning ?Pain Intervention(s): Limited activity within patient's  tolerance, Monitored during session  ? ? ? ?Hand Dominance   ?  ?Extremity/Trunk Assessment Upper Extremity Assessment ?Upper Extremity Assessment: Generalized weakness ?  ?Lower Extremity Assessment ?Lower Extremity Assessment: Defer to PT evaluation ?  ?Cervical / Trunk Assessment ?Cervical / Trunk Assessment: Kyphotic ?  ?Communication Communication ?Communication: No difficulties ?  ?Cognition Arousal/Alertness: Awake/alert ?Behavior During Therapy: [facility] for tasks assessed/performed ?Overall Cognitive Status: Impaired/Different from baseline ?Area of Impairment: Memory ?  ?  ?  ?  ?  ?  ?  ?  ?  ?  ?Memory: Decreased short-term memory ?  ?  ?  ?  ?General Comments: A&Ox4, STM deficits ?  ?  ?General Comments  VSS on RA, family present ? ?  ?Exercises   ?  ?Shoulder Instructions    ? ? ?Home Living Family/patient expects to be discharged to:: Private residence ?Living Arrangements: Alone ?Available Help at Discharge: Friend(s) ?Type of Home: House ?Home Access: Stairs to enter ?Entrance Stairs-Number of Steps: 2 ?Entrance Stairs-Rails: None ?Home Layout: One level ?  ?  ?Bathroom Shower/Tub: Walk-in shower ?  ?  ?  ?  ?Home Equipment: Shower seat - built Medical sales representative (2 wheels);Cane - single point ?  ?  ?  ? ?  ?Prior Functioning/Environment Prior Level of Function : Needs assist;Driving ?  ?  ?  ?  ?  ?  ?Mobility Comments: drives to grocery store "when necessary," uses walker vs cane - pt reports multiple falls "too many to count" ?ADLs Comments: independent ADL's, has aide 2x/wk for household chores, uses pill box ?  ? ?  ?  ?OT Problem List: Decreased strength;Decreased range of motion;Decreased activity tolerance;Pain;Decreased safety awareness;Decreased knowledge of precautions;Decreased knowledge of use of DME or AE ?  ?   ?OT Treatment/Interventions: Self-care/ADL training;Therapeutic exercise;DME and/or AE instruction;Therapeutic activities  ?  ?OT Goals(Current goals can be found in the care plan  section) Acute Rehab OT Goals ?Patient Stated Goal: less pain ?OT Goal Formulation: With patient ?Time For Goal Achievement: 02/21/22 ?Potential to Achieve Goals: Good ?ADL Goals ?Pt Will Perform Grooming: with modified independence;standing ?Pt Will Perform Lower Body Dressing: with modified independence;sit to/from stand ?Pt Will Transfer to Toilet: with modified independence;ambulating ?Additional ADL Goal #1: Pt will indep complete IADLmedication management task  ?OT Frequency: Min 2X/week ?  ? ?Co-evaluation   ?  ?  ?  ?  ? ?  ?AM-PAC OT "6 Clicks" Daily Activity     ?Outcome Measure Help from another person eating meals?: None ?Help from another person taking care of personal grooming?: A Little ?Help from another person toileting, which includes using toliet, bedpan, or urinal?: A Lot ?Help from another person bathing (including washing, rinsing, drying)?: A Lot ?Help from another person to put on and taking off regular upper body clothing?: A Little ?Help from another person to put on and taking off regular lower body clothing?: A Lot ?6 Click Score: 16 ?  ?End of Session Nurse Communication: Mobility status ? ?Activity Tolerance: Patient tolerated treatment well;Patient limited by pain ?Patient left: in bed;with call bell/phone within reach;with bed alarm set;with family/visitor present ? ?OT Visit Diagnosis: Unsteadiness on feet (R26.81);Other abnormalities of gait and mobility (R26.89);History of falling (Z91.81);Muscle weakness (generalized) (M62.81);Pain  ?              ?  Time: [id number] ?OT Time Calculation (min): 29 min ?Charges:  OT General Charges ?$OT Visit: 1 Visit ?OT Evaluation ?$OT Eval Moderate Complexity: 1 Mod ? ? ?Mireya Meditz A Coretha Creswell ?02/07/2022, 7:30 PM ?

## 2022-02-07 NOTE — Progress Notes (Signed)
? ? ? ? ? ?Patient Name: Isabel Barnes ?  ?81 YO woman admitted and seen in consult 3/11 for afib and RVR with hypotension in setting of lower extremity cellulitis significant unintended weight loss and chronic anemia ? ?Started on amio for rate control as not tolerant of CCB 2/2 hypotension ?  ?SUBJECTIVE:much improved, still with severe hip pain but less dyspnea  ? ?Past Medical History:  ?Diagnosis Date  ? Anemia   ? iron deficiency hx.has had iron infusions before   ? Chronic low back pain   ? Chronic pain disorder   ? Complication of anesthesia   ? severe claustrophobia  ? Constipation   ? r/t use of pain meds.Takes OTC meds or eats prunes  ? Depression   ? GERD (gastroesophageal reflux disease)   ? takes Omeprazole daily  ? Heart murmur   ? mild MS, moderate-severe AS 03/17/21 echo  ? History of bronchitis   ? 20+ yrs ago  ? History of kidney stones   ? 3 surgerical removed, 1 passed  ? History of prolapse of bladder   ? History of shingles   ? Hypertension   ? takes Losartan daily  ? Hypothyroidism   ? takes Synthroid daily  ? Joint swelling   ? Neck pain   ? bone spurs at base of head per pt  ? Osteoarthritis   ? lumbar,cervical,joints  ? Pneumonia   ? hx of > 20 yrs ago  ? Shortness of breath   ? occasionally and with exertion. Albuterol inhaler as needed  ? Spinal headache 1991  ? blood patch placed  ? Spondylitis ([place])   ? Unsteady gait   ? occasionally  ? Urinary urgency   ? ? ?Scheduled Meds: ? ?Scheduled Meds: ? sodium chloride   Intravenous Once  ? cycloSPORINE  1 drop Both Eyes Daily  ? pantoprazole  40 mg Oral Daily  ? predniSONE  5 mg Oral Q breakfast  ? rosuvastatin  5 mg Oral Daily  ? ?Continuous Infusions: ? amiodarone 30 mg/hr (02/06/22 2245)  ? cefTRIAXone (ROCEPHIN)  IV    ? magnesium sulfate bolus IVPB    ? ?HYDROcodone-acetaminophen, methocarbamol ? ? ? ?PHYSICAL EXAM ?Vitals:  ? 02/06/22 2245 02/06/22 2352 02/07/22 0354 02/07/22 0819  ?BP: (!) 114/50 (!) 112/57 122/73 125/64  ?Pulse:  79 82 100 96  ?Resp: 16 18 20  (!) 22  ?Temp: 97.7 ?F (36.5 ?C) 98.1 ?F (36.7 ?C) 98.8 ?F (37.1 ?C) 100 ?F (37.8 ?C)  ?TempSrc: Oral Oral Oral Oral  ?SpO2: 97% 98% 92% 93%  ?Weight:      ?Height:      ? ? ?General appearance: alert, cooperative, cachectic, and no distress ? ?HENT normal ?Neck supple with JVP-  8 ?Clear ?Regular rate and rhythm, no murmurs or gallops ?Abd-soft with active BS ?No Clubbing cyanosis edema ?Skin-warm and dry ?A & Oriented  Grossly normal sensory and motor function ?  ?ECG   ? ?TELEMETRY: Reviewed personnally pt in sinus converted yesterday pm about 1700 with improved HR : ? ?  ? ?  ? ? ?LABS: ?Basic Metabolic Panel: ?Recent Labs  ?Lab 02/06/22 ?1239 02/07/22 ?0316  ?NA 132* 135  ?K 3.8 4.2  ?CL 98 103  ?CO2 22 24  ?GLUCOSE 103* 111*  ?BUN 24* 18  ?CREATININE 0.68 0.61  ?CALCIUM 8.3* 8.0*  ?MG  --  1.8  ?PHOS  --  3.0  ? ?Cardiac Enzymes: ?No results for input(s): CKTOTAL, CKMB,  CKMBINDEX, TROPONINI in the last 72 hours. ?CBC: ?Recent Labs  ?Lab 02/06/22 ?1239 02/07/22 ?0316  ?WBC 16.3* 15.0*  ?NEUTROABS 14.2*  --   ?HGB 8.5* 9.7*  ?HCT 26.5* 29.2*  ?MCV 81.3 80.7  ?PLT 478* 436*  ? ?PROTIME: ?No results for input(s): LABPROT, INR in the last 72 hours. ?Liver Function Tests: ?Recent Labs  ?  02/06/22 ?1239  ?AST 26  ?ALT 20  ?ALKPHOS 79  ?BILITOT 0.6  ?PROT 5.3*  ?ALBUMIN 2.1*  ? ?Recent Labs  ?  02/07/22 ?0316  ?CHOL 106  ?HDL 25*  ?[place] 66  ?TRIG 73  ?CHOLHDL 4.2  ? ?Thyroid Function Tests: ?Recent Labs  ?  02/06/22 ?1311  ?TSH 4.449  ? ?Anemia Panel: ?Recent Labs  ?  02/07/22 ?0316  ?FERRITIN 238  ?TIBC 155*  ?IRON 21*  ? ?  ? ? ?ASSESSMENT AND PLAN: ?Atrial fibrillation with rapid ventricular response ?  ?Hypotension with warm extremities ?  ?Lower extremity cellulitis?  Sepsis ?  ?Anemia-chronic-worse ?  ?Weight loss-profound-unintentional ?  ?DNR ? ? ?Hass converted to sinus can transition amio to po  200 bid  surveillance labs WNL   ? ?Venous dopplers to exclude DVT   ? ? ? ? ?Signed, ?Virl Axe MD ? ?02/07/2022 ? ?

## 2022-02-08 ENCOUNTER — Inpatient Hospital Stay (HOSPITAL_COMMUNITY): Payer: Medicare Other

## 2022-02-08 DIAGNOSIS — I480 Paroxysmal atrial fibrillation: Secondary | ICD-10-CM | POA: Diagnosis not present

## 2022-02-08 DIAGNOSIS — W19XXXA Unspecified fall, initial encounter: Secondary | ICD-10-CM

## 2022-02-08 DIAGNOSIS — Z7189 Other specified counseling: Secondary | ICD-10-CM

## 2022-02-08 DIAGNOSIS — I4891 Unspecified atrial fibrillation: Secondary | ICD-10-CM

## 2022-02-08 DIAGNOSIS — L03115 Cellulitis of right lower limb: Secondary | ICD-10-CM | POA: Diagnosis not present

## 2022-02-08 DIAGNOSIS — I350 Nonrheumatic aortic (valve) stenosis: Secondary | ICD-10-CM

## 2022-02-08 LAB — ECHOCARDIOGRAM COMPLETE
AR max vel: 1.57 cm2
AV Area VTI: 1.67 cm2
AV Area mean vel: 1.54 cm2
AV Mean grad: 24 mmHg
AV Peak grad: 43.6 mmHg
Ao pk vel: 3.3 m/s
Area-P 1/2: 2.91 cm2
Height: 60 in
S' Lateral: 2.7 cm
Weight: 1744 oz

## 2022-02-08 LAB — CBC
HCT: 24.7 % — ABNORMAL LOW (ref 36.0–46.0)
Hemoglobin: 8.2 g/dL — ABNORMAL LOW (ref 12.0–15.0)
MCH: 27 pg (ref 26.0–34.0)
MCHC: 33.2 g/dL (ref 30.0–36.0)
MCV: 81.3 fL (ref 80.0–100.0)
Platelets: 371 10*3/uL (ref 150–400)
RBC: 3.04 MIL/uL — ABNORMAL LOW (ref 3.87–5.11)
RDW: 15.5 % (ref 11.5–15.5)
WBC: 12.2 10*3/uL — ABNORMAL HIGH (ref 4.0–10.5)
nRBC: 0 % (ref 0.0–0.2)

## 2022-02-08 LAB — VON WILLEBRAND PANEL
Coagulation Factor VIII: 176 % — ABNORMAL HIGH (ref 56–140)
Ristocetin Co-factor, Plasma: 228 % — ABNORMAL HIGH (ref 50–200)
Von Willebrand Antigen, Plasma: 250 % — ABNORMAL HIGH (ref 50–200)

## 2022-02-08 LAB — COAG STUDIES INTERP REPORT

## 2022-02-08 MED ORDER — MENTHOL 3 MG MT LOZG
1.0000 | LOZENGE | OROMUCOSAL | Status: DC | PRN
  Administered 2022-02-08 – 2022-02-18 (×2): 3 mg via ORAL
  Filled 2022-02-08 (×2): qty 9

## 2022-02-08 MED ORDER — VANCOMYCIN HCL 1250 MG/250ML IV SOLN
1250.0000 mg | INTRAVENOUS | Status: DC
  Administered 2022-02-08 – 2022-02-22 (×8): 1250 mg via INTRAVENOUS
  Filled 2022-02-08 (×8): qty 250

## 2022-02-08 MED ORDER — PHENOL 1.4 % MT LIQD
1.0000 | OROMUCOSAL | Status: DC | PRN
  Administered 2022-02-08: 1 via OROMUCOSAL
  Filled 2022-02-08 (×2): qty 177

## 2022-02-08 NOTE — Progress Notes (Signed)
?  Echocardiogram ?2D Echocardiogram has been performed. ? ?Isabel Barnes ?02/08/2022, 10:30 AM ?

## 2022-02-08 NOTE — Progress Notes (Addendum)
[facility] Progress Note ? ?Isabel Barnes 81 y.o. 1941-10-31 ? ?CC:  Melena ? ? ?Subjective: ?Patient states she is not doing well today. She states she has been short of breath which she states is not new for her. She states her throat feels raw and she would like some throat lozenges. No further bowel movements. No further melena. ECHO was just completed. ? ?ROS : Review of Systems  ?Constitutional:  Negative for chills and fever.  ?HENT:  Positive for sore throat.   ?Respiratory:  Positive for shortness of breath.   ?Gastrointestinal:  Negative for abdominal pain, blood in stool, constipation, diarrhea, heartburn, melena, nausea and vomiting.   ? ? ?Objective: ?Vital signs in last 24 hours: ?Vitals:  ? 02/08/22 0425 02/08/22 0748  ?BP: (!) 110/57 129/60  ?Pulse: 74 84  ?Resp: 17 20  ?Temp: 97.7 ?F (36.5 ?C) 98.2 ?F (36.8 ?C)  ?SpO2: 91% 94%  ? ? ?Physical Exam: ? ?General:  Alert, cooperative, no distress, pale  ?Head:  Normocephalic, without obvious abnormality, atraumatic  ?Eyes:  Anicteric sclera, EOM's intact, conjunctival pallor  ?Lungs:   Clear to auscultation bilaterally, respirations unlabored  ?Heart:  Regular rate and rhythm, systolic murmur noted  ?Abdomen:   Soft, non-tender, bowel sounds active all four quadrants,  no masses,   ? ? ?Lab Results: ?Recent Labs  ?  02/06/22 ?1239 02/07/22 ?0316  ?NA 132* 135  ?K 3.8 4.2  ?CL 98 103  ?CO2 22 24  ?GLUCOSE 103* 111*  ?BUN 24* 18  ?CREATININE 0.68 0.61  ?CALCIUM 8.3* 8.0*  ?MG  --  1.8  ?PHOS  --  3.0  ? ?Recent Labs  ?  02/06/22 ?1239  ?AST 26  ?ALT 20  ?ALKPHOS 79  ?BILITOT 0.6  ?PROT 5.3*  ?ALBUMIN 2.1*  ? ?Recent Labs  ?  02/06/22 ?1239 02/07/22 ?0316 02/08/22 ?0240  ?WBC 16.3* 15.0* 12.2*  ?NEUTROABS 14.2*  --   --   ?HGB 8.5* 9.7* 8.2*  ?HCT 26.5* 29.2* 24.7*  ?MCV 81.3 80.7 81.3  ?PLT 478* 436* 371  ? ?Recent Labs  ?  02/07/22 ?1113  ?LABPROT 16.1*  ?INR 1.3*  ? ? ? ? ?Assessment ?Melena ?- hgb 8.2 (9.7 yesterday) ?- BUN 18, Cr.  0.61 ? ?Thrombocytosis ?- platelets 436 ? ?Afib with RVR on amiodarone 200mg  BID ?- ECHO pending ? ?RLE edema ? ? ?Plan: ?Patient would like to continue conservative management at this time and would like to continue to decline EGD. ?Will continue pantoprazole ?Continue daily CBC and transfuse as needed to maintain HGB > 7  ?Due to age, comorbidities and patient wanting to avoid anesthesia, do not plan endoscopy unless there is evidence of frank melena or active GI bleed. ?[facility] will follow ? ?Eluterio Seymour PA-C ?02/08/2022, 9:35 AM ? ?Contact #  [phone]  ?

## 2022-02-08 NOTE — Progress Notes (Signed)
? ? ? ? ?Primary Cardiologist:  [facility]/ [facility]  ? ?Patient Name: Isabel Barnes ?  ?81 YO woman admitted and seen in consult 3/11 for afib and RVR with hypotension in setting of lower extremity cellulitis significant unintended weight loss and chronic anemia ? ?Sore throat and hip pain this am  ? ?Past Medical History:  ?Diagnosis Date  ? Anemia   ? iron deficiency hx.has had iron infusions before   ? Chronic low back pain   ? Chronic pain disorder   ? Complication of anesthesia   ? severe claustrophobia  ? Constipation   ? r/t use of pain meds.Takes OTC meds or eats prunes  ? Depression   ? GERD (gastroesophageal reflux disease)   ? takes Omeprazole daily  ? Heart murmur   ? mild MS, moderate-severe AS 03/17/21 echo  ? History of bronchitis   ? 20+ yrs ago  ? History of kidney stones   ? 3 surgerical removed, 1 passed  ? History of prolapse of bladder   ? History of shingles   ? Hypertension   ? takes Losartan daily  ? Hypothyroidism   ? takes Synthroid daily  ? Joint swelling   ? Neck pain   ? bone spurs at base of head per pt  ? Osteoarthritis   ? lumbar,cervical,joints  ? Pneumonia   ? hx of > 20 yrs ago  ? Shortness of breath   ? occasionally and with exertion. Albuterol inhaler as needed  ? Spinal headache 1991  ? blood patch placed  ? 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 Unsteady gait   ? occasionally  ? Urinary urgency   ? ? ?Scheduled Meds: ? ?Scheduled Meds: ? sodium chloride   Intravenous Once  ? acetaminophen  1,000 mg Oral TID  ? amiodarone  200 mg Oral BID  ? cycloSPORINE  1 drop Both Eyes Daily  ? pantoprazole  40 mg Oral Daily  ? predniSONE  5 mg Oral Q breakfast  ? rosuvastatin  5 mg Oral Daily  ? ?Continuous Infusions: ? cefTRIAXone (ROCEPHIN)  IV 2 g (02/07/22 1828)  ? ?HYDROcodone-acetaminophen, menthol-cetylpyridinium, methocarbamol ? ? ? ?PHYSICAL EXAM ?Vitals:  ? 02/07/22 2001 02/08/22 0100 02/08/22 0425 02/08/22 0748  ?BP: (!) 110/53 (!) 111/57 (!) 110/57 129/60  ?Pulse: 79 81 74 84  ?Resp: 16 (!) 21  17 20   ?Temp: 98.4 ?F (36.9 ?C) 98.2 ?F (36.8 ?C) 97.7 ?F (36.5 ?C) 98.2 ?F (36.8 ?C)  ?TempSrc: Oral Oral Oral Oral  ?SpO2: 96% 92% 91% 94%  ?Weight:      ?Height:      ? ? ?General appearance: alert, cooperative, cachectic, and no distress ? ?No distress ?Lungs clear ?Mild AS murmur  ?Abdomen benign ?Right hip pain ?RLE cellulitis with central eschar  ?  ?ECG  flutter rate 155 02/06/22  ? ?TELEMETRY: 02/08/2022 NSR rates 80;s  ? ? ?LABS: ?Basic Metabolic Panel: ?Recent Labs  ?Lab 02/06/22 ?1239 02/07/22 ?0316  ?NA 132* 135  ?K 3.8 4.2  ?CL 98 103  ?CO2 22 24  ?GLUCOSE 103* 111*  ?BUN 24* 18  ?CREATININE 0.68 0.61  ?CALCIUM 8.3* 8.0*  ?MG  --  1.8  ?PHOS  --  3.0  ? ?Cardiac Enzymes: ?No results for input(s): CKTOTAL, CKMB, CKMBINDEX, TROPONINI in the last 72 hours. ?CBC: ?Recent Labs  ?Lab 02/06/22 ?1239 02/07/22 ?0316 02/08/22 ?0240  ?WBC 16.3* 15.0* 12.2*  ?NEUTROABS 14.2*  --   --   ?HGB 8.5* 9.7* 8.2*  ?  HCT 26.5* 29.2* 24.7*  ?MCV 81.3 80.7 81.3  ?PLT 478* 436* 371  ? ?PROTIME: ?Recent Labs  ?  02/07/22 ?1113  ?LABPROT 16.1*  ?INR 1.3*  ? ?Liver Function Tests: ?Recent Labs  ?  02/06/22 ?1239  ?AST 26  ?ALT 20  ?ALKPHOS 79  ?BILITOT 0.6  ?PROT 5.3*  ?ALBUMIN 2.1*  ? ?Recent Labs  ?  02/07/22 ?0316  ?CHOL 106  ?HDL 25*  ?[place] 66  ?TRIG 73  ?CHOLHDL 4.2  ? ?Thyroid Function Tests: ?Recent Labs  ?  02/06/22 ?1311  ?TSH 4.449  ? ?Anemia Panel: ?Recent Labs  ?  02/07/22 ?0316  ?FERRITIN 238  ?TIBC 155*  ?IRON 21*  ? ?  ? ? ?ASSESSMENT AND PLAN: ? ?PAF:  converted to NSR converted to oral by Dr Caryl Comes 02/07/22 200 mg bid no mention of long term anticoagulation palliative care/DNR ? ?Cellulitis:  RLE looks pretty angry On Rocephin plan per primary service Arterial ABI normal in right leg In setting of chronic lymphedema  ?  ? ?Signed, ?Jenkins Rouge MD ? ?02/08/2022 ? ?

## 2022-02-08 NOTE — Consult Note (Cosign Needed)
Consultation Note Date: 02/08/2022   Patient Name: Isabel Barnes  DOB: 1941-04-26  MRN: [id number]  Age / Sex: 81 y.o., female  PCP: Sheral Apley Referring Physician: Aldine Contes, MD  Reason for Consultation: Establishing goals of care  HPI/Patient Profile: 81 y.o. female  with past medical history of cerebellar stroke, iron deficiency anemia, GERD, HTN, hypothyroidism, aortic stenosis, ankylosing spondylitis, venous insufficiency and osteoarthritis admitted on 02/06/2022 with recurrent falls, weakness.   Patient admitted for Afib likely secondary to acute cellulitis of the RLE. She does not wish for an aggressive workup of her conditions. PMT has been consulted to assist with goals of care conversation.  Clinical Assessment and Goals of Care:  I have reviewed medical records including EPIC notes, labs and imaging, received report from RN, assessed the patient and met at the bedside then called patient's son Isabel Barnes to discuss diagnosis prognosis, GOC, EOL wishes, disposition and options.  I introduced Palliative Medicine as specialized medical care for people living with serious illness. It focuses on providing relief from the symptoms and stress of a serious illness. The goal is to improve quality of life for both the patient and the family.   Discussion: Isabel Barnes shares that she has been declining over the past two months, requiring more assistance from neighbors. She lives at home alone and feels she cannot return to her home safely on her own. She finds her loss of independence to be an unacceptable quality of life and would prefer to focus on her comfort, peace, and dignity for the remainder of her life. She feels that her symptoms such as pain should be the priority. Isabel Barnes is concerned about how to logistically arrange her long-term care needs with her family living out of town. Her son  lives in [place] and her daughter lives in [place]. She is a religious person and believes she will be with God when it is time for her to die.   I then called Isabel Barnes to review the above and arrange a family meeting. We reviewed her significant weight loss and possibility of hospice eligibility.     The difference between aggressive medical intervention and comfort care was considered in light of the patient's goals of care. Hospice and Palliative Care services outpatient were explained and offered.   Discussed the importance of continued conversation with family and the medical providers regarding overall plan of care and treatment options, ensuring decisions are within the context of the patients values and GOCs.   Questions and concerns were addressed.  Hard Choices booklet left for review. The family was encouraged to call with questions or concerns.  PMT will continue to support holistically.   PATIENT is primary Media planner. Next of kin are two adult children.    SUMMARY OF RECOMMENDATIONS   -DNR confirmed -Patient is concerned about quality of life with recent decline in function, loss of independence -Family meeting via conference call scheduled for tomorrow 3/14 at [facility] with patient's son Isabel Barnes and daughter Isabel Barnes -Psychosocial and emotional support  provided -PMT will continue to support  Prognosis:  Unable to determine  Discharge Planning: To Be Determined      Primary Diagnoses: Present on Admission:  Cellulitis of right lower extremity   I have reviewed the medical record, interviewed the patient and family, and examined the patient. The following aspects are pertinent.  Past Medical History:  Diagnosis Date   Anemia    iron deficiency hx.has had iron infusions before    Chronic low back pain    Chronic pain disorder    Complication of anesthesia    severe claustrophobia   Constipation    r/t use of pain meds.Takes OTC meds or eats prunes   Depression    GERD  (gastroesophageal reflux disease)    takes Omeprazole daily   Heart murmur    mild MS, moderate-severe AS 03/17/21 echo   History of bronchitis    20+ yrs ago   History of kidney stones    3 surgerical removed, 1 passed   History of prolapse of bladder    History of shingles    Hypertension    takes Losartan daily   Hypothyroidism    takes Synthroid daily   Joint swelling    Neck pain    bone spurs at base of head per pt   Osteoarthritis    lumbar,cervical,joints   Pneumonia    hx of > 20 yrs ago   Shortness of breath    occasionally and with exertion. 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[place])    Unsteady gait    occasionally   Urinary urgency    Social History   Socioeconomic History   Marital status: Divorced    Spouse name: Not on file   Number of children: Not on file   Years of education: Not on file   Highest education level: Not on file  Occupational History   Not on file  Tobacco Use   Smoking status: Former    Packs/day: 0.50    Years: 2.00    Pack years: 1.00    Types: Cigarettes    Quit date: 02/07/1992    Years since quitting: 30.0   Smokeless tobacco: Never  Vaping Use   Vaping Use: Never used  Substance and Sexual Activity   Alcohol use: No   Drug use: No   Sexual activity: Not on file  Other Topics Concern   Not on file  Social History Narrative   Not on file   Social Determinants of Health   Financial Resource Strain: Not on file  Food Insecurity: Not on file  Transportation Needs: Not on file  Physical Activity: Not on file  Stress: Not on file  Social Connections: Not on file   Family History  Problem Relation Age of Onset   Stroke Father    Scheduled Meds:  sodium chloride   Intravenous Once   acetaminophen  1,000 mg Oral TID   amiodarone  200 mg Oral BID   cycloSPORINE  1 drop Both Eyes Daily   pantoprazole  40 mg Oral Daily   predniSONE  5 mg Oral Q breakfast    rosuvastatin  5 mg Oral Daily   Continuous Infusions:  cefTRIAXone (ROCEPHIN)  IV 2 g (02/07/22 1828)   vancomycin 1,250 mg (02/08/22 1113)   PRN Meds:.HYDROcodone-acetaminophen, menthol-cetylpyridinium, methocarbamol Medications Prior to Admission:  Prior to Admission medications   Medication Sig Start Date End Date Taking? Authorizing  Provider  albuterol (VENTOLIN HFA) 108 (90 Base) MCG/ACT inhaler Inhale 2 puffs into the lungs every 4 (four) hours as needed for shortness of breath.    Yes [provider]  aspirin EC 81 MG tablet Take 81 mg by mouth daily.   Yes [provider]  carboxymethylcellulose (REFRESH PLUS) 0.5 % SOLN Place 1 drop into both eyes in the morning, at noon, and at bedtime.   Yes [provider]  cholecalciferol (VITAMIN D) 1000 UNITS tablet Take 1,000 Units by mouth 2 (two) times daily.   Yes [provider]  diclofenac sodium (VOLTAREN) 1 % GEL Apply 2 g topically 2 (two) times daily as needed (for pain).   Yes [provider]  Digestive Enzymes (ENZYME DIGEST PO) Take 1 tablet by mouth daily as needed (For digestive).   Yes [provider]  diphenhydrAMINE (BENADRYL) 25 MG tablet Take 25 mg by mouth every 6 (six) hours as needed for allergies.   Yes [provider]  ENSURE PLUS (ENSURE PLUS) LIQD Take 237 mLs by mouth daily as needed (for supplementation).   Yes [provider]  fish oil-omega-3 fatty acids 1000 MG capsule Take 1,000 mg by mouth 2 (two) times daily.    Yes [provider]  furosemide (LASIX) 20 MG tablet Take 20 mg by mouth daily as needed for fluid.   Yes [provider]  HYDROcodone-acetaminophen (NORCO) 10-325 MG tablet Take 1-2 tablets by mouth every 6 (six) hours as needed for severe pain. Patient taking differently: Take 1 tablet by mouth every 6 (six) hours as needed for moderate pain. 07/16/16  Yes Gary Fleet, PA-C  loperamide (IMODIUM A-D) 2 MG tablet  Take 4 mg by mouth 3 (three) times daily as needed for diarrhea or loose stools.   Yes [provider]  losartan (COZAAR) 25 MG tablet Take 25 mg by mouth daily.   Yes [provider]  magnesium 30 MG tablet Take 30 mg by mouth daily.    Yes [provider]  methocarbamol (ROBAXIN) 500 MG tablet Take 500 mg by mouth 3 (three) times daily as needed for muscle spasms. 12/03/19  Yes [provider]  Multiple Vitamins-Minerals (MULTIVITAMIN PO) Take 1 tablet by mouth daily.   Yes [provider]  ondansetron (ZOFRAN) 8 MG tablet Take 1 tablet (8 mg total) by mouth every 8 (eight) hours as needed for nausea or vomiting. 06/25/17  Yes Daleen Bo, MD  POTASSIUM PO Take 1 tablet by mouth daily.   Yes [provider]  predniSONE (DELTASONE) 5 MG tablet Take 5 mg by mouth daily with breakfast. 12/03/19  Yes [provider]  Probiotic Product (PROBIOTIC DAILY PO) Take 1 capsule by mouth daily.    Yes [provider]  RESTASIS 0.05 % ophthalmic emulsion Place 1 drop into both eyes daily. 09/18/19  Yes [provider]  rosuvastatin (CRESTOR) 5 MG tablet Take 5 mg by mouth daily. 01/27/22  Yes [provider]  triamcinolone (NASACORT) 55 MCG/ACT AERO nasal inhaler Place 1-2 sprays into the nose 2 (two) times daily as needed (for seasonal allergies). 07/14/17  Yes [provider]  Turmeric 500 MG CAPS Take 500 mg by mouth at bedtime.    Yes [provider]  vitamin B-12 (CYANOCOBALAMIN) 250 MCG tablet Take 250 mcg by mouth at bedtime.    Yes [provider]  levothyroxine (SYNTHROID, LEVOTHROID) 50 MCG tablet Take 50 mcg by mouth every morning.  Patient not taking:  Reported on 02/06/2022    [provider]  omeprazole (PRILOSEC) 20 MG capsule Take 20 mg by mouth See admin instructions. Take 20 mg by mouth in the morning before breakfast and an additional 20 mg during the day as needed for reflux     [provider]  prochlorperazine (COMPAZINE) 10 MG tablet Take 1 tablet (10 mg total) by mouth 2 (two) times daily as needed for nausea or vomiting (or headache, may take with benadryl 7m). Patient not taking: Reported on 02/06/2022 04/25/20   SGareth Morgan MD   Allergies  Allergen Reactions   Dilaudid [Hydromorphone Hcl] Shortness Of Breath   Gabapentin Other (See Comments)    Hoarseness , headache and sore throat   Latex Rash    Severe rash   Lyrica [Pregabalin] Other (See Comments)    No balance , had to walk with cane , Blurred vision,weakness.   Oxycodone Shortness Of Breath and Other (See Comments)    Cannot breathe.   Singulair [Montelukast] Shortness Of Breath and Other (See Comments)    Vision issues, also   Ciprofloxacin Hcl Nausea And Vomiting and Other (See Comments)    Nausea and vomiting with by mouth form   Codeine Other (See Comments)    Hallucinations   Methadone Nausea And Vomiting and Other (See Comments)    Severe nausea and vomiting   Metronidazole Nausea And Vomiting and Other (See Comments)    Gastric pain   Oysters [Shellfish Allergy] Other (See Comments)    "Terrible gastric upset and cramping."   Clindamycin/Lincomycin Diarrhea and Nausea Only   Donepezil Diarrhea and Other (See Comments)    Severe diarrhea   Sulfa Antibiotics Diarrhea and Other (See Comments)    GI issues    Tape Other (See Comments)    Severe rash   Zetia [Ezetimibe] Diarrhea   Elemental Sulfur Nausea And Vomiting   Iodine Rash   Other Rash    All Antibiotic ointments/ creams   Oyster Shell Rash   Penicillin G Nausea And Vomiting and Rash   Penicillins Nausea And Vomiting and Rash    Has patient had a PCN reaction causing immediate rash, facial/tongue/throat swelling, SOB or lightheadedness with hypotension: Yes Has patient had a PCN reaction causing severe rash involving mucus membranes or skin necrosis: No Has patient had a PCN reaction that required  hospitalization No Has patient had a PCN reaction occurring within the last 10 years: No If all of the above answers are "NO", then may proceed with Cephalosporin use.    Povidone Iodine Rash and Other (See Comments)    Oyster shell products- Rash    Skintegrity Hydrogel [Skin Protectants, Misc.] Rash   Tapentadol Other (See Comments)    Nightmares   Review of Systems  Constitutional:  Positive for fatigue.  All other systems reviewed and are negative.  Physical Exam Vitals and nursing note reviewed.  Constitutional:      General: She is not in acute distress.    Appearance: She is ill-appearing.  Cardiovascular:     Rate and Rhythm: Normal rate.  Pulmonary:     Effort: Pulmonary effort is normal.  Skin:    General: Skin is warm and dry.  Neurological:     Mental Status: She is alert and oriented to person, place, and time.  Psychiatric:        Mood and Affect: Mood normal.    Vital Signs: BP 112/66    Pulse 93    Temp 99.5  F (37.5 C) (Oral)    Resp 20    Ht 5' (1.524 m)    Wt 49.4 kg    SpO2 96%    BMI 21.29 kg/m  Pain Scale: 0-10   Pain Score: 0-No pain   SpO2: SpO2: 96 % O2 Device:SpO2: 96 % O2 Flow Rate: .   IO: Intake/output summary:  Intake/Output Summary (Last 24 hours) at 02/08/2022 1544 Last data filed at 02/08/2022 0900 Gross per 24 hour  Intake 360 ml  Output 0 ml  Net 360 ml    LBM:   Baseline Weight: Weight: 49.4 kg Most recent weight: Weight: 49.4 kg     Palliative Assessment/Data:    Total time: I spent 80 minutes in the care of the patient today in the above activities and documenting the encounter.   Dorthy Cooler, PA-C Palliative Medicine Team Team phone # [phone]  Thank you for allowing the Palliative Medicine Team to assist in the care of this patient. Please utilize secure chat with additional questions, if there is no response within 30 minutes please call the above phone number.  Palliative Medicine Team providers are  available by phone from 7am to 7pm daily and can be reached through the team cell phone.  Should this patient require assistance outside of these hours, please call the patient's attending physician.

## 2022-02-08 NOTE — Plan of Care (Signed)
  Problem: Education: Goal: Knowledge of General Education information will improve Description Including pain rating scale, medication(s)/side effects and non-pharmacologic comfort measures Outcome: Progressing   

## 2022-02-08 NOTE — Progress Notes (Signed)
Pharmacy Antibiotic Note ? ?Isabel Barnes is a 81 y.o. female admitted on 02/06/2022 with cellulitis.  Initially started on ceftriaxone, now with concern for worsening cellulitis. Per WOC, purulent drainage observed. Pharmacy has been consulted for Vancomycin dosing. ? ?Plan: ?Ceftriaxone 2g IV Q24h ?Vancomycin 1250mg  IV Q48h (goal AUC 400-550, eAUC 479, Scr rounded to 0.8) ? ?Height: 5' (152.4 cm) ?Weight: 49.4 kg (109 lb) ?IBW/kg (Calculated) : 45.5 ? ?Temp (24hrs), Avg:98.1 ?F (36.7 ?C), Min:97.7 ?F (36.5 ?C), Max:98.4 ?F (36.9 ?C) ? ?Recent Labs  ?Lab 02/06/22 ?1239 02/06/22 ?1540 02/06/22 ?1938 02/07/22 ?0316 02/08/22 ?0240  ?WBC 16.3*  --   --  15.0* 12.2*  ?CREATININE 0.68  --   --  0.61  --   ?LATICACIDVEN  --  1.5 1.9  --   --   ?  ?Estimated Creatinine Clearance: 38.9 mL/min (by C-G formula based on SCr of 0.61 mg/dL).   ? ?Allergies  ?Allergen Reactions  ? Dilaudid [Hydromorphone Hcl] Shortness Of Breath  ? Gabapentin Other (See Comments)  ?  Hoarseness , headache and sore throat  ? Latex Rash  ?  Severe rash  ? Lyrica [Pregabalin] Other (See Comments)  ?  No balance , had to walk with cane , Blurred vision,weakness.  ? Oxycodone Shortness Of Breath and Other (See Comments)  ?  Cannot breathe.  ? Singulair [Montelukast] Shortness Of Breath and Other (See Comments)  ?  Vision issues, also  ? Ciprofloxacin Hcl Nausea And Vomiting and Other (See Comments)  ?  Nausea and vomiting with by mouth form  ? Codeine Other (See Comments)  ?  Hallucinations  ? Methadone Nausea And Vomiting and Other (See Comments)  ?  Severe nausea and vomiting  ? Metronidazole Nausea And Vomiting and Other (See Comments)  ?  Gastric pain  ? Oysters [Shellfish Allergy] Other (See Comments)  ?  "Terrible gastric upset and cramping."  ? Clindamycin/Lincomycin Diarrhea and Nausea Only  ? Donepezil Diarrhea and Other (See Comments)  ?  Severe diarrhea  ? Sulfa Antibiotics Diarrhea and Other (See Comments)  ?  GI issues ?  ? Tape Other  (See Comments)  ?  Severe rash  ? Zetia [Ezetimibe] Diarrhea  ? Elemental Sulfur Nausea And Vomiting  ? Iodine Rash  ? Other Rash  ?  All Antibiotic ointments/ creams  ? Oyster Shell Rash  ? Penicillin G Nausea And Vomiting and Rash  ? Penicillins Nausea And Vomiting and Rash  ?  Has patient had a PCN reaction causing immediate rash, facial/tongue/throat swelling, SOB or lightheadedness with hypotension: Yes ?Has patient had a PCN reaction causing severe rash involving mucus membranes or skin necrosis: No ?Has patient had a PCN reaction that required hospitalization No ?Has patient had a PCN reaction occurring within the last 10 years: No ?If all of the above answers are "NO", then may proceed with Cephalosporin use. ?  ? Povidone Iodine Rash and Other (See Comments)  ?  Oyster shell products- Rash ?  ? Skintegrity Hydrogel [Skin Protectants, Misc.] Rash  ? Tapentadol Other (See Comments)  ?  Nightmares  ? ? ?Antimicrobials this admission: ?Ceftriaxone 3/12 >>  ? ? ?Microbiology results: ?3/11 BCx: NGTD ? ? ?Thank you for allowing pharmacy to be a part of this patient?s care. ? ?5/11, PharmD ?Clinical Pharmacist ? ?

## 2022-02-08 NOTE — Consult Note (Addendum)
WOC Nurse Consult Note: ?Reason for Consult: Consult requested for legs. Previous consult notes indicate; "although this could be reactive from infection, I now worry her focal RLE soft tissue enlargement with bruising might represent a mass or sarcoma. Will proceed with MRI given non diagnostic ultrasound.Malignancy could also explain her unintentional weight loss and thrombocyt osis." ?Wound type: Left leg with dry yellow scabbed partial thickness wound; 2X2X.1cm, there is another partial thickness wound located in close proximity, .2X.2X.1cm, pink and dry ?Right leg with black fluctuant full thickness wound; location is fluctuant and extends above skin level and 8X6cm, tracks downward to 8 cm when a swab is inserted.  Painful to touch with very large amt thick tan pus draining. ?Bilat legs with dry scaly peeling skin. ?Medical team at the bedside to assess wound appearance and discuss plan of care with patient.  If aggressive plan of care is desired, then surgical consult should be considered for possible bedside debridement of the location to facilitate drainage. Pt states she would like to pursue comfort care goals. Topical treatment will not promote healing but will absorb drainage and minimize dressing adherence over the site.  ?Dressing procedure/placement/frequency: Topical treatment orders provided for bedside nurses to perform as follows to absorb drainage: Apply xeroform gauze to right leg wound Q day, then cover with ABD ads and kerlex. ?Foam dressing to left leg, change Q 3 days or PRN soiling. ?Please re-consult if further assistance is needed.  Thank-you,  ?Cammie Mcgee MSN, RN, CWOCN, [place], CNS ?[phone]  ? ?  ?

## 2022-02-08 NOTE — Consult Note (Cosign Needed)
Reason for Consult:Right leg wound Referring Physician: Aldine Contes Time called: [id number] Time at bedside: [place] is an 81 y.o. female.  HPI: Zahriya was admitted over the weekend after suffering several falls. She has a chronic lower extremity wound that has gotten worse in the last 7d or so. It is associated with pain but has not drained until today. [place] did not show any fluid collection but with the advent of purulent discharge orthopedic surgery was consulted. She has had a long history of various ulcers but none here. She denies fevers, chills, sweats, N/V.  Past Medical History:  Diagnosis Date   Anemia    iron deficiency hx.has had iron infusions before    Chronic low back pain    Chronic pain disorder    Complication of anesthesia    severe claustrophobia   Constipation    r/t use of pain meds.Takes OTC meds or eats prunes   Depression    GERD (gastroesophageal reflux disease)    takes Omeprazole daily   Heart murmur    mild MS, moderate-severe AS 03/17/21 echo   History of bronchitis    20+ yrs ago   History of kidney stones    3 surgerical removed, 1 passed   History of prolapse of bladder    History of shingles    Hypertension    takes Losartan daily   Hypothyroidism    takes Synthroid daily   Joint swelling    Neck pain    bone spurs at base of head per pt   Osteoarthritis    lumbar,cervical,joints   Pneumonia    hx of > 20 yrs ago   Shortness of breath    occasionally and with exertion. 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[place])    Unsteady gait    occasionally   Urinary urgency     Past Surgical History:  Procedure Laterality Date   ABDOMINAL HYSTERECTOMY     ANTERIOR FUSION CERVICAL SPINE     x2 -C4-7   BUNIONECTOMY Bilateral    COLONOSCOPY     CYSTOSCOPY W/ URETEROSCOPY  2012   EYE SURGERY Bilateral    cataract /lens implant   HOLMIUM LASER APPLICATION Left [id number]    Procedure: HOLMIUM LASER APPLICATION;  Surgeon: Malka So, MD;  Location: [facility];  Service: Urology;  Laterality: Left;   INSERTION OF MESH N/A 07/15/2014   Procedure: INSERTION OF MESH;  Surgeon: Adin Hector, MD;  Location: [place];  Service: General;  Laterality: N/A;   JOINT REPLACEMENT Right 2012   shoulder   LAPAROSCOPIC CHOLECYSTECTOMY W/ CHOLANGIOGRAPHY  2012   Dr Ninfa Linden   NASAL SINUS SURGERY     OPEN REDUCTION INTERNAL FIXATION (ORIF) DISTAL RADIAL FRACTURE Left 03/20/2021   Procedure: OPEN REDUCTION INTERNAL FIXATION (ORIF) DISTAL RADIAL FRACTURE;  Surgeon: Marchia Bond, MD;  Location: [place];  Service: Orthopedics;  Laterality: Left;   RADIOLOGY WITH ANESTHESIA N/A 05/09/2014   Procedure: ADULT SEDATION WITH ANESTHESIA/MRI CERVICAL SPINE WITHOUT CONTRAST;  Surgeon: Medication Radiologist, MD;  Location: [place];  Service: Radiology;  Laterality: N/A;  DR. 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 Her smoking use included cigarettes. She has a 1.00 pack-year smoking history. She has never used smokeless tobacco. She reports that she does not drink alcohol and does not use drugs.  Allergies:  Allergies  Allergen Reactions   Dilaudid [Hydromorphone Hcl] Shortness Of Breath   Gabapentin Other (See Comments)    Hoarseness , headache and  sore throat   Latex Rash    Severe rash   Lyrica [Pregabalin] Other (See Comments)    No balance , had to walk with cane , Blurred vision,weakness.   Oxycodone Shortness Of Breath and Other (See Comments)    Cannot breathe.   Singulair [Montelukast] Shortness Of Breath and Other (See Comments)    Vision issues, also   Ciprofloxacin Hcl Nausea And Vomiting and Other (See Comments)    Nausea and vomiting with by mouth form   Codeine Other (See Comments)    Hallucinations   Methadone Nausea And Vomiting and Other (See Comments)    Severe nausea and vomiting   Metronidazole Nausea And Vomiting and Other (See Comments)    Gastric pain   Oysters [Shellfish Allergy] Other (See Comments)    "Terrible gastric upset and cramping."   Clindamycin/Lincomycin Diarrhea and Nausea Only   Donepezil Diarrhea and Other (See Comments)    Severe diarrhea   Sulfa Antibiotics Diarrhea and Other (See Comments)    GI issues    Tape Other (See Comments)    Severe rash   Zetia [Ezetimibe] Diarrhea   Elemental Sulfur Nausea And Vomiting   Iodine Rash   Other Rash    All Antibiotic ointments/ creams   Oyster Shell Rash   Penicillin G Nausea And Vomiting and Rash   Penicillins Nausea And Vomiting and Rash    Has patient had a PCN reaction causing immediate rash, facial/tongue/throat swelling, SOB or lightheadedness with hypotension: Yes Has patient had a PCN reaction causing severe rash involving mucus membranes or skin necrosis: No Has patient had a PCN reaction that required hospitalization No Has patient had a PCN reaction occurring within the last 10 years: No If all of the above answers are "NO", then may proceed with Cephalosporin use.    Povidone Iodine Rash and Other (See Comments)    Oyster shell products- Rash    Skintegrity Hydrogel [Skin Protectants, Misc.] Rash   Tapentadol Other (See Comments)    Nightmares    Medications: I have reviewed the patient's current medications.  Results  for orders placed or performed during the hospital encounter of 02/06/22 (from the past 48 hour(s))  TSH     Status: None   Collection Time: 02/06/22  1:11 PM  Result Value Ref Range   TSH 4.449 0.350 - 4.500 uIU/mL    Comment: Performed by a 3rd Generation assay with a functional sensitivity of <=0.01 uIU/mL. Performed at [facility], [place] [address] [place], [place] [address]   POC occult blood, ED     Status: None   Collection Time: 02/06/22  2:14 PM  Result Value Ref Range   Fecal Occult Bld  NEGATIVE NEGATIVE  Culture, blood (routine x 2)     Status: None (Preliminary result)   Collection Time: 02/06/22  3:40 PM   Specimen: BLOOD LEFT ARM  Result Value Ref Range   Specimen Description BLOOD LEFT ARM    Special Requests      BOTTLES DRAWN AEROBIC AND ANAEROBIC Blood Culture adequate volume   Culture      NO GROWTH 2 DAYS Performed at [facility], [address], [place], [place] [address]    Report Status PENDING   Lactic acid, plasma     Status: None   Collection Time: 02/06/22  3:40 PM  Result Value Ref Range   Lactic Acid, Venous 1.5 0.5 - 1.9 mmol/L    Comment: Performed at [facility], [place] [address] [place], [place] [address]  Resp Panel by RT-PCR (Flu A&B, Covid) Nasopharyngeal Swab     Status: None   Collection Time: 02/06/22  5:02 PM   Specimen: Nasopharyngeal Swab; Nasopharyngeal(NP) swabs in vial transport medium  Result Value Ref Range   SARS Coronavirus 2 by RT PCR NEGATIVE NEGATIVE    Comment: (NOTE) SARS-CoV-2 target nucleic acids are NOT DETECTED.  The SARS-CoV-2 RNA is generally detectable in upper respiratory specimens during the acute phase of infection. The lowest concentration of SARS-CoV-2 viral copies this assay can detect is 138 copies/mL. A negative result does not preclude SARS-Cov-2 infection and should not be used as the sole basis for treatment or other patient management decisions. A negative result may occur with   improper specimen collection/handling, submission of specimen other than nasopharyngeal swab, presence of viral mutation(s) within the areas targeted by this assay, and inadequate number of viral copies(<138 copies/mL). A negative result must be combined with clinical observations, patient history, and epidemiological information. The expected result is Negative.  Fact Sheet for Patients:  EntrepreneurPulse.com.au  Fact Sheet for Healthcare Providers:  IncredibleEmployment.be  This test is no t yet approved or cleared by the [place] FDA and  has been authorized for detection and/or diagnosis of SARS-CoV-2 by FDA under an Emergency Use Authorization (EUA). This EUA will remain  in effect (meaning this test can be used) for the duration of the COVID-19 declaration under Section 564(b)(1) of the Act, 21 U.S.C.section 360bbb-3(b)(1), unless the authorization is terminated  or revoked sooner.       Influenza A by PCR NEGATIVE NEGATIVE   Influenza B by PCR NEGATIVE NEGATIVE    Comment: (NOTE) The Xpert Xpress SARS-CoV-2/FLU/RSV plus assay is intended as an aid in the diagnosis of influenza from Nasopharyngeal swab specimens and should not be used as a sole basis for treatment. Nasal washings and aspirates are unacceptable for Xpert Xpress SARS-CoV-2/FLU/RSV testing.  Fact Sheet for Patients: EntrepreneurPulse.com.au  Fact Sheet for Healthcare Providers: IncredibleEmployment.be  This test is not yet approved or cleared by the [place] FDA and has been authorized for detection and/or diagnosis of SARS-CoV-2 by FDA under an Emergency Use Authorization (EUA). This EUA will remain in effect (meaning this test can be used) for the duration of the COVID-19 declaration under Section 564(b)(1) of the Act, 21 U.S.C. section 360bbb-3(b)(1), unless the authorization is terminated or revoked.  Performed at  [facility], [place] [address], [place], [place] [address]   Prepare RBC (crossmatch)     Status: None   Collection Time: 02/06/22  5:05 PM  Result Value Ref Range   Order Confirmation      ORDER PROCESSED  BY BLOOD BANK Performed at [facility], [place] [address], [place], [place] [address]   Culture, blood (routine x 2)     Status: None (Preliminary result)   Collection Time: 02/06/22  7:31 PM   Specimen: BLOOD LEFT FOREARM  Result Value Ref Range   Specimen Description BLOOD LEFT FOREARM    Special Requests      BOTTLES DRAWN AEROBIC AND ANAEROBIC Blood Culture results may not be optimal due to an inadequate volume of blood received in culture bottles   Culture      NO GROWTH 2 DAYS Performed at [facility], [place] [address] [place], [place] [address]    Report Status PENDING   Type and screen [place]     Status: None   Collection Time: 02/06/22  7:31 PM  Result Value Ref Range   ABO/RH(D) A NEG    Antibody Screen NEG    Sample Expiration 02/09/2022,2359    Unit Number [id number]    Blood Component Type RED CELLS,LR    Unit division 00    Status of Unit ISSUED,FINAL    Transfusion Status OK TO TRANSFUSE    Crossmatch Result      Compatible Performed at [facility], [place] [address] [place], [place] [address]   Lactic acid, plasma     Status: None   Collection Time: 02/06/22  7:38 PM  Result Value Ref Range   Lactic Acid, Venous 1.9 0.5 - 1.9 mmol/L    Comment: Performed at [place] [address] [place], [place] [address]  Iron and TIBC     Status: Abnormal   Collection Time: 02/07/22  3:16 AM  Result Value Ref Range   Iron 21 (L) 28 - 170 ug/dL   TIBC 155 (L) 250 - 450 ug/dL   Saturation Ratios 14 10.4 - 31.8 %   UIBC 134 ug/dL    Comment: Performed at [facility], [place] [address] [place], [place] [address]  Ferritin     Status: None   Collection Time: 02/07/22  3:16 AM  Result Value Ref Range    Ferritin 238 11 - 307 ng/mL    Comment: Performed at [facility], [place] [address], [place], [place] [address]  Magnesium     Status: None   Collection Time: 02/07/22  3:16 AM  Result Value Ref Range   Magnesium 1.8 1.7 - 2.4 mg/dL    Comment: Performed at [place] [address] [place], [place] [address]  Phosphorus     Status: None   Collection Time: 02/07/22  3:16 AM  Result Value Ref Range   Phosphorus 3.0 2.5 - 4.6 mg/dL    Comment: Performed at [place] [address] [place], [place] [address]  CBC     Status: Abnormal   Collection Time: 02/07/22  3:16 AM  Result Value Ref Range   WBC 15.0 (H) 4.0 - 10.5 K/uL   RBC 3.62 (L) 3.87 - 5.11 MIL/uL   Hemoglobin 9.7 (L) 12.0 - 15.0 g/dL   HCT 29.2 (L) 36.0 - 46.0 %   MCV 80.7 80.0 - 100.0 fL   MCH 26.8 26.0 - 34.0 pg   MCHC 33.2 30.0 - 36.0 g/dL   RDW 14.9 11.5 - 15.5 %   Platelets 436 (H) 150 - 400 K/uL   nRBC 0.0 0.0 - 0.2 %    Comment: Performed at [facility]  [facility], [address] [place], [place] [address]  Basic metabolic panel     Status: Abnormal   Collection Time: 02/07/22  3:16 AM  Result Value Ref Range   Sodium 135 135 - 145 mmol/L   Potassium 4.2 3.5 - 5.1 mmol/L   Chloride 103 98 - 111 mmol/L   CO2 24 22 - 32 mmol/L   Glucose, Bld 111 (H) 70 - 99 mg/dL    Comment: Glucose reference range applies only to samples taken after fasting for at least 8 hours.   BUN 18 8 - 23 mg/dL   Creatinine, Ser 6.04 0.44 - 1.00 mg/dL   Calcium 8.0 (L) 8.9 - 10.3 mg/dL   GFR, Estimated >54 >09 mL/min    Comment: (NOTE) Calculated using the CKD-EPI Creatinine Equation (2021)    Anion gap 8 5 - 15    Comment: Performed at [facility], [address] [place], [place] [address]  VITAMIN D 25 Hydroxy (Vit-D Deficiency, Fractures)     Status: None   Collection Time: 02/07/22  3:16 AM  Result Value Ref Range   Vit D, 25-Hydroxy 69.88 30 - 100 ng/mL    Comment: (NOTE) Vitamin D deficiency has  been defined by the Institute of Medicine  and an Endocrine Society practice guideline as a level of serum 25-OH  vitamin D less than 20 ng/mL (1,2). The Endocrine Society went on to  further define vitamin D insufficiency as a level between 21 and 29  ng/mL (2).  1. IOM (Institute of Medicine). 2010. Dietary reference intakes for  calcium and D. [place]: [employer]. 2. Holick MF, [place], Bischoff-Ferrari HA, et al. Evaluation,  treatment, and prevention of vitamin D deficiency: an Endocrine  Society clinical practice guideline, JCEM. 2011 Jul; 96(7): 1911-30.  Performed at [facility], [address], [place], [place] [address]   Lipid panel     Status: Abnormal   Collection Time: 02/07/22  3:16 AM  Result Value Ref Range   Cholesterol 106 0 - 200 mg/dL   Triglycerides 73 <562 mg/dL   HDL 25 (L) >13 mg/dL   Total CHOL/HDL Ratio 4.2 RATIO   VLDL 15 0 - 40 mg/dL   LDL Cholesterol 66 0 - 99 mg/dL    Comment:        Total Cholesterol/HDL:CHD Risk Coronary Heart Disease Risk Table                     Men   Women  1/2 Average Risk   3.4   3.3  Average Risk       5.0   4.4  2 X Average Risk   9.6   7.1  3 X Average Risk  23.4   11.0        Use the calculated Patient Ratio above and the CHD Risk Table to determine the patient's CHD Risk.        ATP III CLASSIFICATION (LDL):  <100     mg/dL   Optimal  086-578  mg/dL   Near or Above                    Optimal  130-159  mg/dL   Borderline  469-629  mg/dL   High  >528     mg/dL   Very High Performed at [facility], [address] [place], [place] [address]   Sedimentation rate  Status: Abnormal   Collection Time: 02/07/22  3:16 AM  Result Value Ref Range   Sed Rate 40 (H) 0 - 22 mm/hr    Comment: Performed at [facility] [address] [place], [place] [address]  C-reactive protein     Status: Abnormal   Collection Time: 02/07/22  3:16 AM  Result Value Ref Range   CRP  20.5 (H) <1.0 mg/dL    Comment: Performed at [place] [address] [place], [place] [address]  Protime-INR     Status: Abnormal   Collection Time: 02/07/22 11:13 AM  Result Value Ref Range   Prothrombin Time 16.1 (H) 11.4 - 15.2 seconds   INR 1.3 (H) 0.8 - 1.2    Comment: (NOTE) INR goal varies based on device and disease states. Performed at [facility], [place] [address], [place], [place] [address]   APTT     Status: Abnormal   Collection Time: 02/07/22 11:13 AM  Result Value Ref Range   aPTT 40 (H) 24 - 36 seconds    Comment:        IF BASELINE aPTT IS ELEVATED, SUGGEST PATIENT RISK ASSESSMENT BE USED TO DETERMINE APPROPRIATE ANTICOAGULANT THERAPY. Performed at [facility], [place] [address], [place], [place] [address]   CBC     Status: Abnormal   Collection Time: 02/08/22  2:40 AM  Result Value Ref Range   WBC 12.2 (H) 4.0 - 10.5 K/uL   RBC 3.04 (L) 3.87 - 5.11 MIL/uL   Hemoglobin 8.2 (L) 12.0 - 15.0 g/dL   HCT 24.7 (L) 36.0 - 46.0 %   MCV 81.3 80.0 - 100.0 fL   MCH 27.0 26.0 - 34.0 pg   MCHC 33.2 30.0 - 36.0 g/dL   RDW 15.5 11.5 - 15.5 %   Platelets 371 150 - 400 K/uL   nRBC 0.0 0.0 - 0.2 %    Comment: Performed at [facility], [place] [address] [place],  [address]    ECHOCARDIOGRAM COMPLETE  Result Date: 02/08/2022    ECHOCARDIOGRAM REPORT   Patient Name:   Isabel Barnes Date of Exam: 02/08/2022 Medical Rec #:  [id number]         Height:       60.0 in Accession #:    [id number]        Weight:       109.0 lb Date of Birth:  September 15, 1941          BSA:          1.442 m Patient Age:    50 years          BP:           129/60 mmHg Patient Gender: F                 HR:           88 bpm. Exam Location:  Inpatient Procedure: 2D Echo, Color Doppler and Cardiac Doppler Indications:    Aortic stenosis I35.0  History:        Patient has prior history of Echocardiogram examinations, most                 recent 03/17/2021. Signs/Symptoms:Shortness of  Breath; Risk                 Factors:Hypertension.  Sonographer:    Bernadene Person RDCS Referring Phys: [id number] [place]  1. Left ventricular ejection  fraction, by estimation, is 60 to 65%. The left ventricle has normal function. The left ventricle has no regional wall motion abnormalities. Left ventricular diastolic parameters are consistent with Grade II diastolic dysfunction (pseudonormalization). Elevated left ventricular end-diastolic pressure.  2. Right ventricular systolic function is normal. The right ventricular size is normal. There is mildly elevated pulmonary artery systolic pressure. The estimated right ventricular systolic pressure is 99991111 mmHg.  3. Left atrial size was moderately dilated.  4. The pericardial effusion is anterior to the right ventricle and surrounding the apex. Moderate pleural effusion in the left lateral region.  5. The mitral valve is normal in structure. Mild mitral valve regurgitation. No evidence of mitral stenosis. Severe mitral annular calcification.  6. The aortic valve is normal in structure. There is severe calcifcation of the aortic valve. There is severe thickening of the aortic valve. Aortic valve regurgitation is not visualized. Moderate aortic valve stenosis. Aortic valve area, by VTI measures 1.67 cm. Aortic valve mean gradient measures 24.0 mmHg. Aortic valve Vmax measures 3.30 m/s.  7. The inferior vena cava is dilated in size with >50% respiratory variability, suggesting right atrial pressure of 8 mmHg.  8. Compared to echo dated 03/17/2021, the mean AVG has decreased from 65mmHg to 100mmHg, AV VTi has decreased from 3.70m/s to 3.3 m/s and DVI has increased from 0.28 to 0.44. FINDINGS  Left Ventricle: Left ventricular ejection fraction, by estimation, is 60 to 65%. The left ventricle has normal function. The left ventricle has no regional wall motion abnormalities. The left ventricular internal cavity size was normal in size. There is  no left  ventricular hypertrophy. Left ventricular diastolic parameters are consistent with Grade II diastolic dysfunction (pseudonormalization). Elevated left ventricular end-diastolic pressure. Right Ventricle: The right ventricular size is normal. No increase in right ventricular wall thickness. Right ventricular systolic function is normal. There is mildly elevated pulmonary artery systolic pressure. The tricuspid regurgitant velocity is 2.81  m/s, and with an assumed right atrial pressure of 8 mmHg, the estimated right ventricular systolic pressure is 99991111 mmHg. Left Atrium: Left atrial size was moderately dilated. Right Atrium: Right atrial size was normal in size. Pericardium: Trivial pericardial effusion is present. The pericardial effusion is anterior to the right ventricle and surrounding the apex. Mitral Valve: The mitral valve is normal in structure. Severe mitral annular calcification. Mild mitral valve regurgitation. No evidence of mitral valve stenosis. Tricuspid Valve: The tricuspid valve is normal in structure. Tricuspid valve regurgitation is mild . No evidence of tricuspid stenosis. Aortic Valve: The aortic valve is normal in structure. There is severe calcifcation of the aortic valve. There is severe thickening of the aortic valve. Aortic valve regurgitation is not visualized. Moderate aortic stenosis is present. Aortic valve mean gradient measures 24.0 mmHg. Aortic valve peak gradient measures 43.6 mmHg. Aortic valve area, by VTI measures 1.67 cm. Pulmonic Valve: The pulmonic valve was normal in structure. Pulmonic valve regurgitation is not visualized. No evidence of pulmonic stenosis. Aorta: The aortic root is normal in size and structure. Venous: The inferior vena cava is dilated in size with greater than 50% respiratory variability, suggesting right atrial pressure of 8 mmHg. IAS/Shunts: No atrial level shunt detected by color flow Doppler. Additional Comments: There is a moderate pleural effusion  in the left lateral region.  LEFT VENTRICLE PLAX 2D LVIDd:         4.70 cm   Diastology LVIDs:         2.70 cm   LV e'  medial:    6.24 cm/s LV PW:         0.90 cm   LV E/e' medial:  25.8 LV IVS:        0.80 cm   LV e' lateral:   8.19 cm/s LVOT diam:     2.20 cm   LV E/e' lateral: 19.7 LV SV:         108 LV SV Index:   75 LVOT Area:     3.80 cm  RIGHT VENTRICLE RV S prime:     16.20 cm/s TAPSE (M-mode): 1.7 cm LEFT ATRIUM             Index        RIGHT ATRIUM           Index LA diam:        4.10 cm 2.84 cm/m   RA Area:     12.30 cm LA Vol (A2C):   61.5 ml 42.64 ml/m  RA Volume:   26.10 ml  18.10 ml/m LA Vol (A4C):   61.0 ml 42.30 ml/m LA Biplane Vol: 63.4 ml 43.96 ml/m  AORTIC VALVE AV Area (Vmax):    1.57 cm AV Area (Vmean):   1.54 cm AV Area (VTI):     1.67 cm AV Vmax:           330.33 cm/s AV Vmean:          228.667 cm/s AV VTI:            0.649 m AV Peak Grad:      43.6 mmHg AV Mean Grad:      24.0 mmHg LVOT Vmax:         136.00 cm/s LVOT Vmean:        92.400 cm/s LVOT VTI:          0.285 m LVOT/AV VTI ratio: 0.44  AORTA Ao Root diam: 2.70 cm Ao Asc diam:  2.80 cm MITRAL VALVE                TRICUSPID VALVE MV Area (PHT): 2.91 cm     TR Peak grad:   31.6 mmHg MV Decel Time: 261 msec     TR Vmax:        281.00 cm/s MV E velocity: 161.00 cm/s MV A velocity: 89.40 cm/s   SHUNTS MV E/A ratio:  1.80         Systemic VTI:  0.29 m                             Systemic Diam: 2.20 cm Fransico Him MD Electronically signed by Fransico Him MD Signature Date/Time: 02/08/2022/10:46:48 AM    Final    [place] RT LOWER EXTREM LTD SOFT TISSUE NON VASCULAR  Result Date: 02/07/2022 CLINICAL DATA:  Concern for hematoma versus abscess lower extremity EXAM: ULTRASOUND RIGHT LOWER EXTREMITY LIMITED TECHNIQUE: Ultrasound examination of the lower extremity soft tissues was performed in the area of clinical concern. COMPARISON:  None. FINDINGS: Extensive edema within the right medial calf in the area of concern. No well-defined  wall to suggest abscess. IMPRESSION: Extensive edema within the subcutaneous soft tissues in the area of concern without well-defined abscess. Electronically Signed   By: Rolm Baptise M.D.   On: 02/07/2022 19:14   VAS [place] LOWER EXTREMITY VENOUS (DVT)  Result Date: 02/07/2022  Lower Venous DVT Study Patient Name:  BRISTAL HOOTER  Date of Exam:  02/07/2022 Medical Rec #: [id number]          Accession #:    [id number] Date of Birth: Mar 12, 1941           Patient Gender: F Patient Age:   40 years Exam Location:  [facility] Procedure:      VAS [place] LOWER EXTREMITY VENOUS (DVT) Referring Phys: Virl Axe --------------------------------------------------------------------------------  Indications: Pain, Swelling, and Erythema. Other Indications: Sepsis secondary to cellulitis/abscess on right calf. Risk Factors: Patient has moderate to severe Aortic stenosis. Limitations: Pain, skin texture, edema. Comparison Study: Prior negative right LEV done 08/28/2015 Performing Technologist: Sharion Dove RVS  Examination Guidelines: A complete evaluation includes B-mode imaging, spectral Doppler, color Doppler, and power Doppler as needed of all accessible portions of each vessel. Bilateral testing is considered an integral part of a complete examination. Limited examinations for reoccurring indications may be performed as noted. The reflux portion of the exam is performed with the patient in reverse Trendelenburg.  +---------+---------------+---------+-----------+---------------+--------------+  RIGHT     Compressibility Phasicity Spontaneity Properties      Thrombus Aging  +---------+---------------+---------+-----------+---------------+--------------+  CFV                                             pulsatile                                                                        waveforms                       +---------+---------------+---------+-----------+---------------+--------------+  FV Prox                                          pulsatile                                                                        waveforms                       +---------+---------------+---------+-----------+---------------+--------------+  FV Mid                                          pulsatile                                                                        waveforms                       +---------+---------------+---------+-----------+---------------+--------------+  FV Distal                                       pulsatile                                                                        waveforms                       +---------+---------------+---------+-----------+---------------+--------------+  PFV                                             pulsatile                                                                        waveforms                       +---------+---------------+---------+-----------+---------------+--------------+  POP                                             pulsatile                                                                        waveforms                       +---------+---------------+---------+-----------+---------------+--------------+  PTV                                                             Not well                                                                         visualized      +---------+---------------+---------+-----------+---------------+--------------+  PERO  Not well                                                                         visualized      +---------+---------------+---------+-----------+---------------+--------------+   +---------+---------------+---------+-----------+---------------+--------------+  LEFT      Compressibility Phasicity Spontaneity Properties      Thrombus Aging  +---------+---------------+---------+-----------+---------------+--------------+  CFV       Full                                   pulsatile                                                                        waveforms                       +---------+---------------+---------+-----------+---------------+--------------+  SFJ       Full                                                                  +---------+---------------+---------+-----------+---------------+--------------+  FV Prox   Full                                                                  +---------+---------------+---------+-----------+---------------+--------------+  FV Mid    Full                                                                  +---------+---------------+---------+-----------+---------------+--------------+  FV Distal Full                                  pulsatile                                                                        waveforms                       +---------+---------------+---------+-----------+---------------+--------------+  PFV  Not well                                                                         visualized      +---------+---------------+---------+-----------+---------------+--------------+  POP                                             pulsatile                                                                        waveforms                       +---------+---------------+---------+-----------+---------------+--------------+  PTV                                                             Not well                                                                         visualized      +---------+---------------+---------+-----------+---------------+--------------+  PERO                                                            Not well                                                                         visualized      +---------+---------------+---------+-----------+---------------+--------------+    Summary: RIGHT: - There is no  evidence of deep vein thrombosis in the lower extremity. However, portions of this examination were limited- see technologist comments above.  - No cystic structure found in the popliteal fossa. - Ultrasound characteristics of enlarged lymph nodes are noted in the groin. pulsatile waveforms throughout, suggestive of fluid overload  LEFT: - There is no evidence of deep vein thrombosis in the lower extremity. However, portions of this examination were limited- see technologist comments above.  - No cystic structure found in  the popliteal fossa. - Ultrasound characteristics of enlarged lymph nodes noted in the groin. pulsatile waveforms throughout, suggestive of fluid overload.  *See table(s) above for measurements and observations.    Preliminary     Review of Systems  Constitutional:  Negative for chills, diaphoresis and fever.  HENT:  Negative for ear discharge, ear pain, hearing loss and tinnitus.   Eyes:  Negative for photophobia and pain.  Respiratory:  Negative for cough and shortness of breath.   Cardiovascular:  Negative for chest pain.  Gastrointestinal:  Negative for abdominal pain, nausea and vomiting.  Genitourinary:  Negative for dysuria, flank pain, frequency and urgency.  Musculoskeletal:  Positive for myalgias (Right lower leg). Negative for back pain and neck pain.  Neurological:  Negative for dizziness and headaches.  Hematological:  Does not bruise/bleed easily.  Psychiatric/Behavioral:  The patient is not nervous/anxious.   Blood pressure 112/66, pulse 93, temperature 99.5 F (37.5 C), temperature source Oral, resp. rate 20, height 5' (1.524 m), weight 49.4 kg, SpO2 96 %. Physical Exam Constitutional:      General: She is not in acute distress.    Appearance: She is well-developed. She is not diaphoretic.  HENT:     Head: Normocephalic and atraumatic.  Eyes:     General: No scleral icterus.       Right eye: No discharge.        Left eye: No discharge.      Conjunctiva/sclera: Conjunctivae normal.  Cardiovascular:     Rate and Rhythm: Normal rate and regular rhythm.  Pulmonary:     Effort: Pulmonary effort is normal. No respiratory distress.  Musculoskeletal:     Cervical back: Normal range of motion.     Comments: RLE No traumatic wounds or ecchymosis. Lichenification diffusely lower leg/foot. Necrotic ulceration medial calf with fluctuance and malodor. Purulent discharge on dressing though no active discharge evident.  Mod TTP calf  No knee or ankle effusion  Knee stable to varus/ valgus and anterior/posterior stress  Sens DPN, SPN, TN intact  Motor EHL, ext, flex, evers 5/5  DP 1+, PT 0, No significant edema  Skin:    General: Skin is warm and dry.  Neurological:     Mental Status: She is alert.  Psychiatric:        Mood and Affect: Mood normal.        Behavior: Behavior normal.    Assessment/Plan: RLE wound -- Will have Dr. Sharol Given evaluation, likely I&D and VAC placement, possibly Wednesday.    Lisette Abu, PA-C Orthopedic Surgery [phone] 02/08/2022, 1:46 PM

## 2022-02-08 NOTE — Progress Notes (Signed)
?  Transition of Care (TOC) Screening Note ? ? ?Patient Details  ?Name: Isabel Barnes ?Date of Birth: 1941-02-27 ? ? ?Transition of Care [facility]) CM/SW Contact:    ?Glennon Mac, RN ?Phone Number: ?02/08/2022, 3:52 PM ? ? ? ?Transition of Care Department [facility]) has reviewed patient and no TOC needs have been identified at this time. We will continue to monitor patient advancement through interdisciplinary progression rounds. If new patient transition needs arise, please place a TOC consult. ? ?Quintella Baton, RN, BSN  ?Trauma/Neuro ICU Case Manager ?[phone] ? ?

## 2022-02-08 NOTE — Progress Notes (Signed)
? ?HD#2 ?SUBJECTIVE:  ?Patient Summary: Isabel Barnes is a 81 y.o. with a pertinent PMH of previous cerebellar stroke, venous insufficiency, iron deficiency anemia, GERD, HTN, hypothyroidism, aortic stenosis, and ankylosing spondylitis, who presented with a fall and admitted for Afib likely secondary to acute cellulitis of the RLE.  ? ?Overnight Events: No acute events overnight ? ?Interim History: This is hospital day 2 for this patient who was seen and evaluated at the bedside this morning. She states that she is tired, her sore throat which is raw, and is in a lot pain from her leg. Wound care RN in room with [place] too, wound probed to ~8cm. Pus is tan colored and the patient said that it's been draining for weeks now.   ? ?OBJECTIVE:  ?Vital Signs: ?Vitals:  ? 02/07/22 1803 02/07/22 2001 02/08/22 0100 02/08/22 0425  ?BP: 121/60 (!) 110/53 (!) 111/57 (!) 110/57  ?Pulse: 81 79 81 74  ?Resp: 20 16 (!) 21 17  ?Temp: 97.9 ?F (36.6 ?C) 98.4 ?F (36.9 ?C) 98.2 ?F (36.8 ?C) 97.7 ?F (36.5 ?C)  ?TempSrc: Oral Oral Oral Oral  ?SpO2: 97% 96% 92% 91%  ?Weight:      ?Height:      ? ?Supplemental O2: Room Air ?SpO2: 91 % ? ?Filed Weights  ? 02/06/22 1205  ?Weight: 49.4 kg  ? ? ? ?Intake/Output Summary (Last 24 hours) at 02/08/2022 0553 ?Last data filed at 02/07/2022 1804 ?Gross per 24 hour  ?Intake 48.4 ml  ?Output 50 ml  ?Net -1.6 ml  ? ?Net IO Since Admission: 632.56 mL [02/08/22 0553] ? ?Physical Exam: ?General: Pleasant, cachectic and chronically ill appearing female laying in bed. No acute distress. ?CV: RRR. Holosysytolic murmur appreciated, IV-V out of VI. 1-2+ BLE edema R>L.  ?Pulmonary: Lungs CTAB. Normal effort. ?Abdominal: Soft, nontender, nondistended. Normal bowel sounds. ?Extremities: Palpable radial and DP pulses. 1-2+ BLE, R>L. Vemous stasis dermatitis. Mulitple joint deformities and OA nodules on PIPs. ?Skin: Warm. RLE soft tissue enlargement with bruising and tan drainage.  ?Neuro: A&Ox3. No focal deficit.   ?Psych: Depressed mood and affect ? ? ? ?ASSESSMENT/PLAN:  ?Assessment: ?Principal Problem: ?  Cellulitis of right lower extremity ?Active Problems: ?  Aortic valve stenosis ?  Anemia ?  Fluid collection (edema) in the arms, legs, hands and feet ? ? ?Plan: ?#Cellulitis, right lower extremitity  ?Patient continues to have pain in her RLE with wounds with fluctuance, initially concerning for hematoma vs abscess. U/S of RLE showed extensive edema within the subcutaneous soft tissues without well-defined abscess. Doppler was negative for DVT but showed evidence of enlarged groin adenopathy- could be reactive from infection.  ?- Continue IV Rocephin and start vanc today  ?- Tylenol 1000 mg tid ?- Norco 10-325 mg q4h prn ?- Robaxin 500 mg q8h prn ?- Ortho surgery consulted for possible I&D ?- Wound care ? ?#Moderate to severe aortic stenosis ?#GI bleed ?#Possible acquired von Willebrand disease ?Patient with aortic stenosis last seen on echo on 03/17/21. Echo today with EF of 60-65%, no WMA, and grade II diastolic dysfunction. On echo, there is severe calcification and thickening of the aortic valve and moderate aortic valve stenosis. Additionally, she has had chronic blood loss with ongoing melenotic stools. She also endorses some gum bleeding when brushing her teeth. Denies nose bleeds. PT/INR and APPT elevated. Patient is at high risk of developing acquired von Willebrand's disease, although von Willebrand panel is still in process. Patient initially had declined EGD, as she did  not want to pursue an invasive interventions and she is still declining this.  ?- Continue protonix 40 mg daily  ?- Daily CBC, maintain Hb >7 ? ?#New Afib  ?Patient initially presented in Afib with rates in the 150s, although rates overnight and this morning improved to the 70-80s. Patient converted to NSR.  ?- Cardiology consulted, appreciate recs ?- Amio 200 mg bid ? ?#Iron deficiency anemia ?Iron studies with mildly low iron and normal iron  sat. Hb dropped from 9.7 yesterday to 8.2 today. ?- Transfuse if Hb<7 ?- Daily CBC ? ?#Thombocytosis, resolved ?Platelets improved from 436 to 371.  ? ?#Ankylosing spondylitis ?#Osteoarthritis ?Patient has chronic back, shoulder, and knee pain. ?- Prednisone 5 mg daily  ? ?#Hypertension ?BP 110/57 - continue to hold home cozaar 25 mg.  ? ?#Goals of care ?Given patient's multiple comorbidities, and desire to pursue conservative measures/not workup her significant weight loss further, we discussed introducing the patient to palliative care to address goals of care. Her primary concern is making sure that her pain is well controlled and that she is comfortable.  ?- Palliative care consulted, appreciate assistance ? ? ?Best Practice: ?Diet: Regular diet ?IVF: Fluids: none ?VTE: HELD ?Code: DNR ?AB: Vanc and Rocephin  ?Therapy Recs: SNF ?Family Contact: Son, to be notified. ?DISPO: Anticipated discharge in 1-2 days to Home vs SNF pending Medical stability. ? ?Signature: ?Quinterious Walraven, D.O.  ?Internal Medicine Resident, PGY-1 ?[facility]  ?Pager: [phone] ?5:53 AM, 02/08/2022  ? ?Please contact the on call pager after 5 pm and on weekends at [phone]. ? ?

## 2022-02-09 ENCOUNTER — Encounter (HOSPITAL_COMMUNITY): Payer: Medicare Other

## 2022-02-09 DIAGNOSIS — E43 Unspecified severe protein-calorie malnutrition: Secondary | ICD-10-CM

## 2022-02-09 DIAGNOSIS — I480 Paroxysmal atrial fibrillation: Secondary | ICD-10-CM | POA: Diagnosis not present

## 2022-02-09 DIAGNOSIS — I739 Peripheral vascular disease, unspecified: Secondary | ICD-10-CM

## 2022-02-09 DIAGNOSIS — L03115 Cellulitis of right lower limb: Secondary | ICD-10-CM

## 2022-02-09 LAB — COMPREHENSIVE METABOLIC PANEL
ALT: 20 U/L (ref 0–44)
AST: 24 U/L (ref 15–41)
Albumin: 1.5 g/dL — ABNORMAL LOW (ref 3.5–5.0)
Alkaline Phosphatase: 107 U/L (ref 38–126)
Anion gap: 6 (ref 5–15)
BUN: 17 mg/dL (ref 8–23)
CO2: 25 mmol/L (ref 22–32)
Calcium: 7.7 mg/dL — ABNORMAL LOW (ref 8.9–10.3)
Chloride: 105 mmol/L (ref 98–111)
Creatinine, Ser: 0.7 mg/dL (ref 0.44–1.00)
GFR, Estimated: >60 mL/min (ref >60)
Glucose, Bld: 121 mg/dL — ABNORMAL HIGH (ref 70–99)
Potassium: 4 mmol/L (ref 3.5–5.1)
Sodium: 136 mmol/L (ref 135–145)
Total Bilirubin: 0.3 mg/dL (ref 0.3–1.2)
Total Protein: 4.6 g/dL — ABNORMAL LOW (ref 6.5–8.1)

## 2022-02-09 LAB — CBC
HCT: 27.2 % — ABNORMAL LOW (ref 36.0–46.0)
Hemoglobin: 8.7 g/dL — ABNORMAL LOW (ref 12.0–15.0)
MCH: 26.1 pg (ref 26.0–34.0)
MCHC: 32 g/dL (ref 30.0–36.0)
MCV: 81.7 fL (ref 80.0–100.0)
Platelets: 364 10*3/uL (ref 150–400)
RBC: 3.33 MIL/uL — ABNORMAL LOW (ref 3.87–5.11)
RDW: 15.6 % — ABNORMAL HIGH (ref 11.5–15.5)
WBC: 11.4 10*3/uL — ABNORMAL HIGH (ref 4.0–10.5)
nRBC: 0 % (ref 0.0–0.2)

## 2022-02-09 LAB — PROTIME-INR
INR: 1.3 — ABNORMAL HIGH (ref 0.8–1.2)
Prothrombin Time: 15.7 seconds — ABNORMAL HIGH (ref 11.4–15.2)

## 2022-02-09 LAB — SURGICAL PCR SCREEN
MRSA, PCR: NEGATIVE
Staphylococcus aureus: NEGATIVE

## 2022-02-09 LAB — CEA: CEA: 0.8 ng/mL (ref 0.0–4.7)

## 2022-02-09 LAB — CANCER ANTIGEN 19-9: CA 19-9: 2 U/mL (ref 0–35)

## 2022-02-09 MED ORDER — CHLORHEXIDINE GLUCONATE 4 % EX LIQD
60.0000 mL | Freq: Once | CUTANEOUS | Status: AC
  Administered 2022-02-10: 4 via TOPICAL
  Filled 2022-02-09: qty 60

## 2022-02-09 MED ORDER — POLYETHYLENE GLYCOL 3350 17 G PO PACK: 17.0000 g | PACK | Freq: Every day | ORAL | Status: DC | PRN

## 2022-02-09 MED ORDER — SENNOSIDES-DOCUSATE SODIUM 8.6-50 MG PO TABS
1.0000 | ORAL_TABLET | Freq: Two times a day (BID) | ORAL | Status: DC
  Administered 2022-02-09 – 2022-02-22 (×26): 1 via ORAL
  Filled 2022-02-09 (×25): qty 1

## 2022-02-09 MED ORDER — PANTOPRAZOLE SODIUM 40 MG PO TBEC
40.0000 mg | DELAYED_RELEASE_TABLET | Freq: Two times a day (BID) | ORAL | Status: DC
Start: 2022-02-09 — End: 2022-02-22
  Administered 2022-02-09 – 2022-02-22 (×23): 40 mg via ORAL
  Filled 2022-02-09 (×23): qty 1

## 2022-02-09 MED ORDER — MUPIROCIN 2 % EX OINT: 1.0000 "application " | TOPICAL_OINTMENT | Freq: Two times a day (BID) | CUTANEOUS | Status: DC

## 2022-02-09 NOTE — Plan of Care (Signed)
  Problem: Education: Goal: Knowledge of General Education information will improve Description Including pain rating scale, medication(s)/side effects and non-pharmacologic comfort measures Outcome: Progressing   

## 2022-02-09 NOTE — Progress Notes (Signed)
? ?HD#3 ?SUBJECTIVE:  ?Patient Summary: Isabel Barnes is a 81 y.o. with a pertinent PMH of previous cerebellar stroke, venous insufficiency, iron deficiency anemia, GERD, HTN, hypothyroidism, aortic stenosis, and ankylosing spondylitis, who presented with a fall and admitted for Afib likely secondary to acute cellulitis of the RLE.  ? ?Overnight Events: No acute events overnight ? ?Interim History: This is hospital day 3 for this patient. She states that she is tired and that her pain is worse. Seems to be a little less down today. Has had a lot a gas, but no BM since admission. Appetite has been good.  ? ?OBJECTIVE:  ?Vital Signs: ?Vitals:  ? 02/08/22 1550 02/08/22 2050 02/09/22 0057 02/09/22 0500  ?BP: (!) 111/58 117/62 109/61 114/64  ?Pulse: 82 64 65 64  ?Resp: _0 ?Temp: 98.1 ?F (36.7 ?C) 98.1 ?F (36.7 ?C) (!) 97.4 ?F (36.3 ?C) (!) 97.4 ?F (36.3 ?C)  ?TempSrc: Oral Oral Oral Oral  ?SpO2: 94% 96% 98% 98%  ?Weight:      ?Height:      ? ?Supplemental O2: Room Air ?SpO2: 98 % ? ?Filed Weights  ? 02/06/22 1205  ?Weight: 49.4 kg  ? ? ? ?Intake/Output Summary (Last 24 hours) at 02/09/2022 0655 ?Last data filed at 02/08/2022 0900 ?Gross per 24 hour  ?Intake 360 ml  ?Output --  ?Net 360 ml  ? ?Net IO Since Admission: 992.56 mL [02/09/22 0655] ? ?Physical Exam: ?General: Pleasant, cachectic and chronically ill appearing laying in bed. No acute distress. ?CV: RRR. IV/VI holosystolic murmur. 1+ BLE edema. ?Pulmonary: Lungs CTAB. Normal effort. No wheezing or rales. ?Abdominal: Soft, nontender, nondistended. Normal bowel sounds. ?Extremities: Palpable radial and DP pulses. 1+ BLE edema.  ?Skin: Venous stasis dermatitis. RLE soft tissue enlargement with bruising and tan drainage, wrapped in gauze.  ?Neuro: A&Ox3. No focal deficit. ?Psych: Normal mood and affect ? ? ? ? ?ASSESSMENT/PLAN:  ?Assessment: ?Principal Problem: ?  Cellulitis of right lower extremity ?Active Problems: ?  Aortic valve stenosis ?  Anemia ?   Fluid collection (edema) in the arms, legs, hands and feet ?  Fall ?  Atrial fibrillation ([place]) ?  Goals of care, counseling/discussion ? ? ?Plan: ?#Cellulitis of right lower extremity ?Patient continues to have pain in her RLE with wounds with erythema and tan drainage coming from the soft tissue. Orthopedic surgery was consulted for possible I&D and they have planned for debridement of her right calf ulcer tomorrow.  ?- Ulcer debridement with ortho on 3/15 ?- Continue Vanc and Rocephin ?- Tylenol 1000 mg tid ?- Norco 10-325 mg q4h prn ?- Robaxin 500 mg q8h prn ?- Wound care ?- ABIs pending  ? ?#Moderate to severe aortic stenosis ?#GI bleed ?#Possible acquired von Willebrand disease ?#Weight loss ?With weight loss and recent GI bleeding, there was initial concern for some GI malignancy. CEA and CA19-9 within normal limits. Von Willebrand panel resulted with coagulation factor VIII elevated at 176, ristocetin co-factor elevated at 228, and plasma von willebrand antigen elevated at 250%.  Hb this morning is stable at 8.7 & bleeding has resolved, although patient has not had a bowel movement this hospitalization.  ?- Protonix 40 mg daily ?- Daily CBC ?- GI has signed off, no plans for egd  ? ?#New Afib  ?Patient converted back to NSR. Cardiology consulted and appreciate their recs. ?- Amiodarone 200 mg bid ? ?#Iron deficiency anemia ?Hb stable at 8.7 today, from 8.2 yesterday. No signs of bleeding.  ? ?#  Ankylosing spondylitis ?#Osteoarthritis ?Patient has chronic back, shoulder, and knee pain. ?- Prednisone 5 mg daily  ? ?#Goals of care ?Given patient's multiple comorbidities, and desire to pursue conservative measures/not workup her significant weight loss further, we introduced the patient to palliative care to address goals of care. Her primary concern is making sure that her pain is well controlled and that she is comfortable. She met with the palliative team yesterday and her concern is her decline in function  and loss of independence. Family meeting was conducted today and the plan will be to discharge patient to SNF with outpatient palliative care follow up.  ? ?Best Practice: ?Diet: Regular diet ?IVF: Fluids: none ?VTE: Held  ?Code: DNR ?AB: Vanc and Rocephin  ?Therapy Recs: SNF ?Family Contact: Son, [name], to be notified. ?DISPO: Anticipated discharge in 2-4 days to Skilled nursing facility pending Medical stability and placement . ? ?Signature: ?Buddy Duty, D.O.  ?Internal Medicine Resident, PGY-1 ?[facility]  ?Pager: [phone] ?6:55 AM, 02/09/2022  ? ?Please contact the on call pager after 5 pm and on weekends at [phone]. ? ?

## 2022-02-09 NOTE — Progress Notes (Signed)
[facility] Progress Note ? ?LOYALTY JESCHKE 81 y.o. [birth year] ? ?CC: GI bleed ? ? ?Subjective: ?Patient seen and examined at bedside.  Denies any GI symptoms.  Denies any bleeding episodes. ? ?ROS : Afebrile, negative for nausea and vomiting ? ? ?Objective: ?Vital signs in last 24 hours: ?Vitals:  ? 02/09/22 0500 02/09/22 0741  ?BP: 114/64 (!) 114/55  ?Pulse: 64 68  ?Resp: 17 20  ?Temp: (!) 97.4 ?F (36.3 ?C) 97.7 ?F (36.5 ?C)  ?SpO2: 98% 93%  ? ? ?Physical Exam: ? ?General -elderly patient.  Not in acute distress ?Abdomen -soft, nontender, nondistended, bowel sounds present.  No peritoneal signs ?Neuro -alert and oriented x3 ? ?Lab Results: ?Recent Labs  ?  02/07/22 ?0316 02/09/22 ?0133  ?NA 135 136  ?K 4.2 4.0  ?CL 103 105  ?CO2 24 25  ?GLUCOSE 111* 121*  ?BUN 18 17  ?CREATININE 0.61 0.70  ?CALCIUM 8.0* 7.7*  ?MG 1.8  --   ?PHOS 3.0  --   ? ?Recent Labs  ?  02/06/22 ?1239 02/09/22 ?0133  ?AST 26 24  ?ALT 20 20  ?ALKPHOS 79 107  ?BILITOT 0.6 0.3  ?PROT 5.3* 4.6*  ?ALBUMIN 2.1* 1.5*  ? ?Recent Labs  ?  02/06/22 ?1239 02/07/22 ?0316 02/08/22 ?[id number] 02/09/22 ?0133  ?WBC 16.3*   < > 12.2* 11.4*  ?NEUTROABS 14.2*  --   --   --   ?HGB 8.5*   < > 8.2* 8.7*  ?HCT 26.5*   < > 24.7* 27.2*  ?MCV 81.3   < > 81.3 81.7  ?PLT 478*   < > 371 364  ? < > = values in this interval not displayed.  ? ?Recent Labs  ?  02/07/22 ?1113 02/09/22 ?0133  ?LABPROT 16.1* 15.7*  ?INR 1.3* 1.3*  ? ? ? ? ?Assessment/Plan: ? ?-Melena.  Resolved now.  Hemoglobin stable ?-Atrial fibrillation with RVR.  Improved now.  Rate controlled. ?-Lower extremity cellulitis with possible abscess ?  ?Recommendations ?---------------------------- ?-No inpatient GI work-up planned at this time. ?-Recommend Protonix 40 mg twice a day for 4 weeks followed by Protonix 40 mg once a day. ?-We will sign off.  Call us back if needed ? ?Otis Brace MD, FACP ?02/09/2022, 9:52 AM ? ?Contact #  [phone]  ?

## 2022-02-09 NOTE — Progress Notes (Signed)
Isabel Barnes   DOB:1941/03/30   [id number]   [id number] ? ?Hematology followup  ? ?Subjective: Isabel Barnes has discharge/draining at the right calf abscess, she is going to have surgical debridement by orthopedic surgeon tomorrow.  Doppler was negative for DVT in right lower extremity yesterday.  She denies any other bleeding.  She feels tired. No fever. VS stable.  ? ? ?Objective:  ?Vitals:  ? 02/09/22 0741 02/09/22 1147  ?BP: (!) 114/55 118/65  ?Pulse: 68 74  ?Resp: 20 18  ?Temp: 97.7 ?F (36.5 ?C) 98 ?F (36.7 ?C)  ?SpO2: 93%   ?  Body mass index is 21.29 kg/m?. ? ?Intake/Output Summary (Last 24 hours) at 02/09/2022 1232 ?Last data filed at 02/09/2022 0743 ?Gross per 24 hour  ?Intake --  ?Output 200 ml  ?Net -200 ml  ? ? ? Sclerae unicteric ? Oropharynx clear ? MSK: Bilateral edema in lower extremity, there is a large mass in right calf with necrotic skin in the center, and bloody discharge ? Neuro nonfocal ?  ? ?CBG (last 3)  ?No results for input(s): GLUCAP in the last 72 hours. ? ? ?Labs:  ? ?Urine Studies ?No results for input(s): UHGB, CRYS in the last 72 hours. ? ?Invalid input(s): UACOL, UAPR, USPG, UPH, UTP, UGL, UKET, UBIL, UNIT, UROB, ULEU, UEPI, UWBC, URBC, UBAC, CAST, UCOM, BILUA ? ?Basic Metabolic Panel: ?Recent Labs  ?Lab 02/06/22 ?1239 02/07/22 ?0316 02/09/22 ?0133  ?NA 132* 135 136  ?K 3.8 4.2 4.0  ?CL 98 103 105  ?CO2 22 24 25   ?GLUCOSE 103* 111* 121*  ?BUN 24* 18 17  ?CREATININE 0.68 0.61 0.70  ?CALCIUM 8.3* 8.0* 7.7*  ?MG  --  1.8  --   ?PHOS  --  3.0  --   ? ?GFR ?Estimated Creatinine Clearance: 38.9 mL/min (by C-G formula based on SCr of 0.7 mg/dL). ?Liver Function Tests: ?Recent Labs  ?Lab 02/06/22 ?1239 02/09/22 ?0133  ?AST 26 24  ?ALT 20 20  ?ALKPHOS 79 107  ?BILITOT 0.6 0.3  ?PROT 5.3* 4.6*  ?ALBUMIN 2.1* 1.5*  ? ?No results for input(s): LIPASE, AMYLASE in the last 168 hours. ?No results for input(s): AMMONIA in the last 168 hours. ?Coagulation profile ?Recent Labs  ?Lab  02/07/22 ?1113 02/09/22 ?0133  ?INR 1.3* 1.3*  ? ? ?CBC: ?Recent Labs  ?Lab 02/06/22 ?1239 02/07/22 ?0316 02/08/22 ?[id number] 02/09/22 ?0133  ?WBC 16.3* 15.0* 12.2* 11.4*  ?NEUTROABS 14.2*  --   --   --   ?HGB 8.5* 9.7* 8.2* 8.7*  ?HCT 26.5* 29.2* 24.7* 27.2*  ?MCV 81.3 80.7 81.3 81.7  ?PLT 478* 436* 371 364  ? ?Cardiac Enzymes: ?No results for input(s): CKTOTAL, CKMB, CKMBINDEX, TROPONINI in the last 168 hours. ?BNP: ?Invalid input(s): POCBNP ?CBG: ?No results for input(s): GLUCAP in the last 168 hours. ?D-Dimer ?No results for input(s): DDIMER in the last 72 hours. ?Hgb A1c ?No results for input(s): HGBA1C in the last 72 hours. ?Lipid Profile ?Recent Labs  ?  02/07/22 ?0316  ?CHOL 106  ?HDL 25*  ?[place] 66  ?TRIG 73  ?CHOLHDL 4.2  ? ?Thyroid function studies ?Recent Labs  ?  02/06/22 ?1311  ?TSH 4.449  ? ?Anemia work up ?Recent Labs  ?  02/07/22 ?0316  ?FERRITIN 238  ?TIBC 155*  ?IRON 21*  ? ?Microbiology ?Recent Results (from the past 240 hour(s))  ?Culture, blood (routine x 2)     Status: None (Preliminary result)  ? Collection Time: 02/06/22  3:40 PM  ?  Specimen: BLOOD LEFT ARM  ?Result Value Ref Range Status  ? Specimen Description BLOOD LEFT ARM  Final  ? Special Requests   Final  ?  BOTTLES DRAWN AEROBIC AND ANAEROBIC Blood Culture adequate volume  ? Culture   Final  ?  NO GROWTH 3 DAYS ?Performed at [facility], [place] [address], [place], [place] [address] ?  ? Report Status PENDING  Incomplete  ?Resp Panel by RT-PCR (Flu A&B, Covid) Nasopharyngeal Swab     Status: None  ? Collection Time: 02/06/22  5:02 PM  ? Specimen: Nasopharyngeal Swab; Nasopharyngeal(NP) swabs in vial transport medium  ?Result Value Ref Range Status  ? SARS Coronavirus 2 by RT PCR NEGATIVE NEGATIVE Final  ?  Comment: (NOTE) ?SARS-CoV-2 target nucleic acids are NOT DETECTED. ? ?The SARS-CoV-2 RNA is generally detectable in upper respiratory ?specimens during the acute phase of infection. The lowest ?concentration of SARS-CoV-2 viral  copies this assay can detect is ?138 copies/mL. A negative result does not preclude SARS-Cov-2 ?infection and should not be used as the sole basis for treatment or ?other patient management decisions. A negative result may occur with  ?improper specimen collection/handling, submission of specimen other ?than nasopharyngeal swab, presence of viral mutation(s) within the ?areas targeted by this assay, and inadequate number of viral ?copies(<138 copies/mL). A negative result must be combined with ?clinical observations, patient history, and epidemiological ?information. The expected result is Negative. ? ?Fact Sheet for Patients:  ?EntrepreneurPulse.com.au ? ?Fact Sheet for Healthcare Providers:  ?IncredibleEmployment.be ? ?This test is no t yet approved or cleared by the [place] FDA and  ?has been authorized for detection and/or diagnosis of SARS-CoV-2 by ?FDA under an Emergency Use Authorization (EUA). This EUA will remain  ?in effect (meaning this test can be used) for the duration of the ?COVID-19 declaration under Section 564(b)(1) of the Act, 21 ?U.S.C.section 360bbb-3(b)(1), unless the authorization is terminated  ?or revoked sooner.  ? ? ?  ? Influenza A by PCR NEGATIVE NEGATIVE Final  ? Influenza B by PCR NEGATIVE NEGATIVE Final  ?  Comment: (NOTE) ?The Xpert Xpress SARS-CoV-2/FLU/RSV plus assay is intended as an aid ?in the diagnosis of influenza from Nasopharyngeal swab specimens and ?should not be used as a sole basis for treatment. Nasal washings and ?aspirates are unacceptable for Xpert Xpress SARS-CoV-2/FLU/RSV ?testing. ? ?Fact Sheet for Patients: ?EntrepreneurPulse.com.au ? ?Fact Sheet for Healthcare Providers: ?IncredibleEmployment.be ? ?This test is not yet approved or cleared by the [place] FDA and ?has been authorized for detection and/or diagnosis of SARS-CoV-2 by ?FDA under an Emergency Use Authorization (EUA). This  EUA will remain ?in effect (meaning this test can be used) for the duration of the ?COVID-19 declaration under Section 564(b)(1) of the Act, 21 U.S.C. ?section 360bbb-3(b)(1), unless the authorization is terminated or ?revoked. ? ?Performed at [facility], [place] [address] [place], [place] ?[address] ?  ?Culture, blood (routine x 2)     Status: None (Preliminary result)  ? Collection Time: 02/06/22  7:31 PM  ? Specimen: BLOOD LEFT FOREARM  ?Result Value Ref Range Status  ? Specimen Description BLOOD LEFT FOREARM  Final  ? Special Requests   Final  ?  BOTTLES DRAWN AEROBIC AND ANAEROBIC Blood Culture results may not be optimal due to an inadequate volume of blood received in culture bottles  ? Culture   Final  ?  NO GROWTH 3 DAYS ?Performed at [facility], [place] [address] [place], [place]  [address] ?  ? Report Status PENDING  Incomplete  ? ? ? ? ?Studies:  ?ECHOCARDIOGRAM COMPLETE ? ?Result Date: 02/08/2022 ?   ECHOCARDIOGRAM REPORT   Patient Name:   Isabel Barnes Date of Exam: 02/08/2022 Medical Rec #:  [id number]         Height:       60.0 in Accession #:    [id number]        Weight:       109.0 lb Date of Birth:  1941-07-31          BSA:          1.442 m? Patient Age:    14 years          BP:           129/60 mmHg Patient Gender: F                 HR:           88 bpm. Exam Location:  Inpatient Procedure: 2D Echo, Color Doppler and Cardiac Doppler Indications:    Aortic stenosis I35.0  History:        Patient has prior history of Echocardiogram examinations, most                 recent 03/17/2021. Signs/Symptoms:Shortness of Breath; Risk                 Factors:Hypertension.  Sonographer:    Bernadene Person RDCS Referring Phys: [id number] [place]  1. Left ventricular ejection fraction, by estimation, is 60 to 65%. The left ventricle has normal function. The left ventricle has no regional wall motion abnormalities. Left ventricular diastolic parameters are consistent with Grade II  diastolic dysfunction (pseudonormalization). Elevated left ventricular end-diastolic pressure.  2. Right ventricular systolic function is normal. The right ventricular size is normal. There is mildly elevated pulmonary

## 2022-02-09 NOTE — H&P (View-Only) (Signed)
? ? ?ORTHOPAEDIC CONSULTATION ? ?REQUESTING PHYSICIAN: Narendra, Nischal, MD ? ?Chief Complaint: Painful necrotic abscess right calf. ? ?HPI: ?Isabel Barnes is a 81 y.o. female who presents with venous and arterial insufficiency with a necrotic abscess medial right calf with purulent drainage swelling cellulitis and pain. ? ?Past Medical History:  ?Diagnosis Date  ? Anemia   ? iron deficiency hx.has had iron infusions before   ? Chronic low back pain   ? Chronic pain disorder   ? Complication of anesthesia   ? severe claustrophobia  ? Constipation   ? r/t use of pain meds.Takes OTC meds or eats prunes  ? Depression   ? GERD (gastroesophageal reflux disease)   ? takes Omeprazole daily  ? Heart murmur   ? mild MS, moderate-severe AS 03/17/21 echo  ? History of bronchitis   ? 20+ yrs ago  ? History of kidney stones   ? 3 surgerical removed, 1 passed  ? History of prolapse of bladder   ? History of shingles   ? Hypertension   ? takes Losartan daily  ? Hypothyroidism   ? takes Synthroid daily  ? Joint swelling   ? Neck pain   ? bone spurs at base of head per pt  ? Osteoarthritis   ? lumbar,cervical,joints  ? Pneumonia   ? hx of > 20 yrs ago  ? Shortness of breath   ? occasionally and with exertion. Albuterol inhaler as needed  ? Spinal headache 1991  ? blood patch placed  ? 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 Unsteady gait   ? occasionally  ? Urinary urgency   ? ?Past Surgical History:  ?Procedure Laterality Date  ? ABDOMINAL HYSTERECTOMY    ? ANTERIOR FUSION CERVICAL SPINE    ? x2 -C4-7  ? BUNIONECTOMY Bilateral   ? COLONOSCOPY    ? CYSTOSCOPY W/ URETEROSCOPY  2012  ? EYE SURGERY Bilateral   ? cataract /lens implant  ? HOLMIUM LASER APPLICATION Left 02/08/2013  ? Procedure: HOLMIUM LASER APPLICATION;  Surgeon: John J Wrenn, MD;  Location: [facility];  Service: Urology;  Laterality: Left;  ? INSERTION OF MESH N/A 07/15/2014  ? Procedure: INSERTION OF MESH;  Surgeon: Steven C. Gross, MD;  Location: [facility];  Service:  General;  Laterality: N/A;  ? JOINT REPLACEMENT Right 2012  ? shoulder  ? LAPAROSCOPIC CHOLECYSTECTOMY W/ CHOLANGIOGRAPHY  2012  ? Dr Blackman  ? NASAL SINUS SURGERY    ? OPEN REDUCTION INTERNAL FIXATION (ORIF) DISTAL RADIAL FRACTURE Left 03/20/2021  ? Procedure: OPEN REDUCTION INTERNAL FIXATION (ORIF) DISTAL RADIAL FRACTURE;  Surgeon: Landau, Joshua, MD;  Location: [facility];  Service: Orthopedics;  Laterality: Left;  ? RADIOLOGY WITH ANESTHESIA N/A 05/09/2014  ? Procedure: ADULT SEDATION WITH ANESTHESIA/MRI CERVICAL SPINE WITHOUT CONTRAST;  Surgeon: Medication Radiologist, MD;  Location: [facility];  Service: Radiology;  Laterality: N/A;  DR. HAWKS/MRI  ? right knee arthroscopy    ? d/t meniscal tear  ? SHOULDER ARTHROSCOPY W/ ROTATOR CUFF REPAIR Bilateral three times each over several yrs  ? THUMB ARTHROSCOPY Left   ? 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 Procedure: RIGHT TOTAL KNEE ARTHROPLASTY;  Surgeon: John Graves, MD;  Location: [facility];  Service: Orthopedics;  Laterality: Right;  ? UMBILICAL HERNIA REPAIR N/A 07/15/2014  ? Procedure: LAPAROSCOPIC UMBILICAL AND INFRAUMBILICAL HERNIA;  Surgeon: Steven C. Gross, MD;  Location: [facility];  Service: General;  Laterality: N/A;  ? ?Social History  ? ?Socioeconomic History  ? Marital   status: Divorced  ?  Spouse name: Not on file  ? Number of children: Not on file  ? Years of education: Not on file  ? Highest education level: Not on file  ?Occupational History  ? Not on file  ?Tobacco Use  ? Smoking status: Former  ?  Packs/day: 0.50  ?  Years: 2.00  ?  Pack years: 1.00  ?  Types: Cigarettes  ?  Quit date: 02/07/1992  ?  Years since quitting: 30.0  ? Smokeless tobacco: Never  ?Vaping Use  ? Vaping Use: Never used  ?Substance and Sexual Activity  ? Alcohol use: No  ? Drug use: No  ? Sexual activity: Not on file  ?Other Topics Concern  ? Not on file  ?Social History Narrative  ? Not on file  ? ?Social Determinants of Health  ? ?Financial Resource Strain: Not on file  ?Food  Insecurity: Not on file  ?Transportation Needs: Not on file  ?Physical Activity: Not on file  ?Stress: Not on file  ?Social Connections: Not on file  ? ?Family History  ?Problem Relation Age of Onset  ? Stroke Father   ? ?- negative except otherwise stated in the family history section ?Allergies  ?Allergen Reactions  ? Dilaudid [Hydromorphone Hcl] Shortness Of Breath  ? Gabapentin Other (See Comments)  ?  Hoarseness , headache and sore throat  ? Latex Rash  ?  Severe rash  ? Lyrica [Pregabalin] Other (See Comments)  ?  No balance , had to walk with cane , Blurred vision,weakness.  ? Oxycodone Shortness Of Breath and Other (See Comments)  ?  Cannot breathe.  ? Singulair [Montelukast] Shortness Of Breath and Other (See Comments)  ?  Vision issues, also  ? Ciprofloxacin Hcl Nausea And Vomiting and Other (See Comments)  ?  Nausea and vomiting with by mouth form  ? Codeine Other (See Comments)  ?  Hallucinations  ? Methadone Nausea And Vomiting and Other (See Comments)  ?  Severe nausea and vomiting  ? Metronidazole Nausea And Vomiting and Other (See Comments)  ?  Gastric pain  ? Oysters [Shellfish Allergy] Other (See Comments)  ?  "Terrible gastric upset and cramping."  ? Clindamycin/Lincomycin Diarrhea and Nausea Only  ? Donepezil Diarrhea and Other (See Comments)  ?  Severe diarrhea  ? Sulfa Antibiotics Diarrhea and Other (See Comments)  ?  GI issues ?  ? Tape Other (See Comments)  ?  Severe rash  ? Zetia [Ezetimibe] Diarrhea  ? Elemental Sulfur Nausea And Vomiting  ? Iodine Rash  ? Other Rash  ?  All Antibiotic ointments/ creams  ? Oyster Shell Rash  ? Penicillin G Nausea And Vomiting and Rash  ? Penicillins Nausea And Vomiting and Rash  ?  Has patient had a PCN reaction causing immediate rash, facial/tongue/throat swelling, SOB or lightheadedness with hypotension: Yes ?Has patient had a PCN reaction causing severe rash involving mucus membranes or skin necrosis: No ?Has patient had a PCN reaction that required  hospitalization No ?Has patient had a PCN reaction occurring within the last 10 years: No ?If all of the above answers are "NO", then may proceed with Cephalosporin use. ?  ? Povidone Iodine Rash and Other (See Comments)  ?  Oyster shell products- Rash ?  ? Skintegrity Hydrogel [Skin Protectants, Misc.] Rash  ? Tapentadol Other (See Comments)  ?  Nightmares  ? ?Prior to Admission medications   ?Medication Sig Start Date End Date Taking? Authorizing Provider  ?  albuterol (VENTOLIN HFA) 108 (90 Base) MCG/ACT inhaler Inhale 2 puffs into the lungs every 4 (four) hours as needed for shortness of breath.    Yes [provider]  ?aspirin EC 81 MG tablet Take 81 mg by mouth daily.   Yes [provider]  ?carboxymethylcellulose (REFRESH PLUS) 0.5 % SOLN Place 1 drop into both eyes in the morning, at noon, and at bedtime.   Yes [provider]  ?cholecalciferol (VITAMIN D) 1000 UNITS tablet Take 1,000 Units by mouth 2 (two) times daily.   Yes [provider]  ?diclofenac sodium (VOLTAREN) 1 % GEL Apply 2 g topically 2 (two) times daily as needed (for pain).   Yes [provider]  ?Digestive Enzymes (ENZYME DIGEST PO) Take 1 tablet by mouth daily as needed (For digestive).   Yes [provider]  ?diphenhydrAMINE (BENADRYL) 25 MG tablet Take 25 mg by mouth every 6 (six) hours as needed for allergies.   Yes [provider]  ?ENSURE PLUS (ENSURE PLUS) LIQD Take 237 mLs by mouth daily as needed (for supplementation).   Yes [provider]  ?fish oil-omega-3 fatty acids 1000 MG capsule Take 1,000 mg by mouth 2 (two) times daily.    Yes [provider]  ?furosemide (LASIX) 20 MG tablet Take 20 mg by mouth daily as needed for fluid.   Yes [provider]  ?HYDROcodone-acetaminophen (NORCO) 10-325 MG tablet Take 1-2 tablets by mouth every 6 (six) hours as needed for severe pain. ?Patient taking differently: Take 1 tablet by mouth every 6 (six)  hours as needed for moderate pain. 07/16/16  Yes Gary Fleet, PA-C  ?loperamide (IMODIUM A-D) 2 MG tablet Take 4 mg by mouth 3 (three) times daily as needed for diarrhea or loose stools.   Yes Provider, Historical,

## 2022-02-09 NOTE — Consult Note (Signed)
? ? ?ORTHOPAEDIC CONSULTATION ? ?REQUESTING PHYSICIAN: Aldine Contes, MD ? ?Chief Complaint: Painful necrotic abscess right calf. ? ?HPI: ?Isabel Barnes is a 81 y.o. female who presents with venous and arterial insufficiency with a necrotic abscess medial right calf with purulent drainage swelling cellulitis and pain. ? ?Past Medical History:  ?Diagnosis Date  ? Anemia   ? iron deficiency hx.has had iron infusions before   ? Chronic low back pain   ? Chronic pain disorder   ? Complication of anesthesia   ? severe claustrophobia  ? Constipation   ? r/t use of pain meds.Takes OTC meds or eats prunes  ? Depression   ? GERD (gastroesophageal reflux disease)   ? takes Omeprazole daily  ? Heart murmur   ? mild MS, moderate-severe AS 03/17/21 echo  ? History of bronchitis   ? 20+ yrs ago  ? History of kidney stones   ? 3 surgerical removed, 1 passed  ? History of prolapse of bladder   ? History of shingles   ? Hypertension   ? takes Losartan daily  ? Hypothyroidism   ? takes Synthroid daily  ? Joint swelling   ? Neck pain   ? bone spurs at base of head per pt  ? Osteoarthritis   ? lumbar,cervical,joints  ? Pneumonia   ? hx of > 20 yrs ago  ? Shortness of breath   ? occasionally and with exertion. Albuterol inhaler as needed  ? Spinal headache 1991  ? blood patch placed  ? Spondylitis ([place])   ? Unsteady gait   ? occasionally  ? Urinary urgency   ? ?Past Surgical History:  ?Procedure Laterality Date  ? ABDOMINAL HYSTERECTOMY    ? ANTERIOR FUSION CERVICAL SPINE    ? x2 -C4-7  ? BUNIONECTOMY Bilateral   ? COLONOSCOPY    ? CYSTOSCOPY W/ URETEROSCOPY  2012  ? EYE SURGERY Bilateral   ? cataract /lens implant  ? HOLMIUM LASER APPLICATION Left [id number]  ? Procedure: HOLMIUM LASER APPLICATION;  Surgeon: Malka So, MD;  Location: [facility];  Service: Urology;  Laterality: Left;  ? INSERTION OF MESH N/A 07/15/2014  ? Procedure: INSERTION OF MESH;  Surgeon: Adin Hector, MD;  Location: [facility];  Service:  General;  Laterality: N/A;  ? JOINT REPLACEMENT Right 2012  ? shoulder  ? LAPAROSCOPIC CHOLECYSTECTOMY W/ CHOLANGIOGRAPHY  2012  ? Dr Ninfa Linden  ? NASAL SINUS SURGERY    ? OPEN REDUCTION INTERNAL FIXATION (ORIF) DISTAL RADIAL FRACTURE Left 03/20/2021  ? Procedure: OPEN REDUCTION INTERNAL FIXATION (ORIF) DISTAL RADIAL FRACTURE;  Surgeon: Marchia Bond, MD;  Location: [facility];  Service: Orthopedics;  Laterality: Left;  ? RADIOLOGY WITH ANESTHESIA N/A 05/09/2014  ? Procedure: ADULT SEDATION WITH ANESTHESIA/MRI CERVICAL SPINE WITHOUT CONTRAST;  Surgeon: Medication Radiologist, MD;  Location: [facility];  Service: Radiology;  Laterality: N/A;  DR. HAWKS/MRI  ? right knee arthroscopy    ? d/t meniscal tear  ? SHOULDER ARTHROSCOPY W/ ROTATOR CUFF REPAIR Bilateral three times each over several yrs  ? THUMB ARTHROSCOPY Left   ? 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 Procedure: RIGHT TOTAL KNEE ARTHROPLASTY;  Surgeon: Dorna Leitz, MD;  Location: [facility];  Service: Orthopedics;  Laterality: Right;  ? UMBILICAL HERNIA REPAIR N/A 07/15/2014  ? Procedure: LAPAROSCOPIC UMBILICAL AND INFRAUMBILICAL HERNIA;  Surgeon: Adin Hector, MD;  Location: [facility];  Service: General;  Laterality: N/A;  ? ?Social History  ? ?Socioeconomic History  ? Marital  status: Divorced  ?  Spouse name: Not on file  ? Number of children: Not on file  ? Years of education: Not on file  ? Highest education level: Not on file  ?Occupational History  ? Not on file  ?Tobacco Use  ? Smoking status: Former  ?  Packs/day: 0.50  ?  Years: 2.00  ?  Pack years: 1.00  ?  Types: Cigarettes  ?  Quit date: 02/07/1992  ?  Years since quitting: 30.0  ? Smokeless tobacco: Never  ?Vaping Use  ? Vaping Use: Never used  ?Substance and Sexual Activity  ? Alcohol use: No  ? Drug use: No  ? Sexual activity: Not on file  ?Other Topics Concern  ? Not on file  ?Social History Narrative  ? Not on file  ? ?Social Determinants of Health  ? ?Financial Resource Strain: Not on file  ?Food  Insecurity: Not on file  ?Transportation Needs: Not on file  ?Physical Activity: Not on file  ?Stress: Not on file  ?Social Connections: Not on file  ? ?Family History  ?Problem Relation Age of Onset  ? Stroke Father   ? ?- negative except otherwise stated in the family history section ?Allergies  ?Allergen Reactions  ? Dilaudid [Hydromorphone Hcl] Shortness Of Breath  ? Gabapentin Other (See Comments)  ?  Hoarseness , headache and sore throat  ? Latex Rash  ?  Severe rash  ? Lyrica [Pregabalin] Other (See Comments)  ?  No balance , had to walk with cane , Blurred vision,weakness.  ? Oxycodone Shortness Of Breath and Other (See Comments)  ?  Cannot breathe.  ? Singulair [Montelukast] Shortness Of Breath and Other (See Comments)  ?  Vision issues, also  ? Ciprofloxacin Hcl Nausea And Vomiting and Other (See Comments)  ?  Nausea and vomiting with by mouth form  ? Codeine Other (See Comments)  ?  Hallucinations  ? Methadone Nausea And Vomiting and Other (See Comments)  ?  Severe nausea and vomiting  ? Metronidazole Nausea And Vomiting and Other (See Comments)  ?  Gastric pain  ? Oysters [Shellfish Allergy] Other (See Comments)  ?  "Terrible gastric upset and cramping."  ? Clindamycin/Lincomycin Diarrhea and Nausea Only  ? Donepezil Diarrhea and Other (See Comments)  ?  Severe diarrhea  ? Sulfa Antibiotics Diarrhea and Other (See Comments)  ?  GI issues ?  ? Tape Other (See Comments)  ?  Severe rash  ? Zetia [Ezetimibe] Diarrhea  ? Elemental Sulfur Nausea And Vomiting  ? Iodine Rash  ? Other Rash  ?  All Antibiotic ointments/ creams  ? Oyster Shell Rash  ? Penicillin G Nausea And Vomiting and Rash  ? Penicillins Nausea And Vomiting and Rash  ?  Has patient had a PCN reaction causing immediate rash, facial/tongue/throat swelling, SOB or lightheadedness with hypotension: Yes ?Has patient had a PCN reaction causing severe rash involving mucus membranes or skin necrosis: No ?Has patient had a PCN reaction that required  hospitalization No ?Has patient had a PCN reaction occurring within the last 10 years: No ?If all of the above answers are "NO", then may proceed with Cephalosporin use. ?  ? Povidone Iodine Rash and Other (See Comments)  ?  Oyster shell products- Rash ?  ? Skintegrity Hydrogel [Skin Protectants, Misc.] Rash  ? Tapentadol Other (See Comments)  ?  Nightmares  ? ?Prior to Admission medications   ?Medication Sig Start Date End Date Taking? Authorizing Provider  ?  albuterol (VENTOLIN HFA) 108 (90 Base) MCG/ACT inhaler Inhale 2 puffs into the lungs every 4 (four) hours as needed for shortness of breath.    Yes [provider]  ?aspirin EC 81 MG tablet Take 81 mg by mouth daily.   Yes [provider]  ?carboxymethylcellulose (REFRESH PLUS) 0.5 % SOLN Place 1 drop into both eyes in the morning, at noon, and at bedtime.   Yes [provider]  ?cholecalciferol (VITAMIN D) 1000 UNITS tablet Take 1,000 Units by mouth 2 (two) times daily.   Yes [provider]  ?diclofenac sodium (VOLTAREN) 1 % GEL Apply 2 g topically 2 (two) times daily as needed (for pain).   Yes [provider]  ?Digestive Enzymes (ENZYME DIGEST PO) Take 1 tablet by mouth daily as needed (For digestive).   Yes [provider]  ?diphenhydrAMINE (BENADRYL) 25 MG tablet Take 25 mg by mouth every 6 (six) hours as needed for allergies.   Yes [provider]  ?ENSURE PLUS (ENSURE PLUS) LIQD Take 237 mLs by mouth daily as needed (for supplementation).   Yes [provider]  ?fish oil-omega-3 fatty acids 1000 MG capsule Take 1,000 mg by mouth 2 (two) times daily.    Yes [provider]  ?furosemide (LASIX) 20 MG tablet Take 20 mg by mouth daily as needed for fluid.   Yes [provider]  ?HYDROcodone-acetaminophen (NORCO) 10-325 MG tablet Take 1-2 tablets by mouth every 6 (six) hours as needed for severe pain. ?Patient taking differently: Take 1 tablet by mouth every 6 (six)  hours as needed for moderate pain. 07/16/16  Yes Gary Fleet, PA-C  ?loperamide (IMODIUM A-D) 2 MG tablet Take 4 mg by mouth 3 (three) times daily as needed for diarrhea or loose stools.   Yes Provider, Historical,

## 2022-02-09 NOTE — Progress Notes (Signed)
? ? ? ? ?Primary Cardiologist:  Pemberton/ Arida  ? ?Patient Name: Isabel Barnes ?  ?81 YO woman admitted and seen in consult 3/11 for afib and RVR with hypotension in setting of lower extremity cellulitis significant unintended weight loss and chronic anemia ? ?For debridement with Dr Lajoyce Corners today  ? ?Past Medical History:  ?Diagnosis Date  ? Anemia   ? iron deficiency hx.has had iron infusions before   ? Chronic low back pain   ? Chronic pain disorder   ? Complication of anesthesia   ? severe claustrophobia  ? Constipation   ? r/t use of pain meds.Takes OTC meds or eats prunes  ? Depression   ? GERD (gastroesophageal reflux disease)   ? takes Omeprazole daily  ? Heart murmur   ? mild MS, moderate-severe AS 03/17/21 echo  ? History of bronchitis   ? 20+ yrs ago  ? History of kidney stones   ? 3 surgerical removed, 1 passed  ? History of prolapse of bladder   ? History of shingles   ? Hypertension   ? takes Losartan daily  ? Hypothyroidism   ? takes Synthroid daily  ? Joint swelling   ? Neck pain   ? bone spurs at base of head per pt  ? Osteoarthritis   ? lumbar,cervical,joints  ? Pneumonia   ? hx of > 20 yrs ago  ? Shortness of breath   ? occasionally and with exertion. Albuterol inhaler as needed  ? Spinal headache 1991  ? blood patch placed  ? 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 Unsteady gait   ? occasionally  ? Urinary urgency   ? ? ?Scheduled Meds: ? ?Scheduled Meds: ? sodium chloride   Intravenous Once  ? acetaminophen  1,000 mg Oral TID  ? amiodarone  200 mg Oral BID  ? cycloSPORINE  1 drop Both Eyes Daily  ? pantoprazole  40 mg Oral Daily  ? predniSONE  5 mg Oral Q breakfast  ? rosuvastatin  5 mg Oral Daily  ? ?Continuous Infusions: ? cefTRIAXone (ROCEPHIN)  IV 2 g (02/08/22 1832)  ? vancomycin 1,250 mg (02/08/22 1113)  ? ?HYDROcodone-acetaminophen, menthol-cetylpyridinium, methocarbamol, phenol ? ? ? ?PHYSICAL EXAM ?Vitals:  ? 02/08/22 2050 02/09/22 0057 02/09/22 0500 02/09/22 0741  ?BP: 117/62 109/61 114/64 (!) 114/55   ?Pulse: 64 65 64 68  ?Resp: 13 15 17 20   ?Temp: 98.1 ?F (36.7 ?C) (!) 97.4 ?F (36.3 ?C) (!) 97.4 ?F (36.3 ?C) 97.7 ?F (36.5 ?C)  ?TempSrc: Oral Oral Oral Oral  ?SpO2: 96% 98% 98% 93%  ?Weight:      ?Height:      ? ? ?General appearance: alert, cooperative, cachectic, and no distress ? ?No distress ?Lungs clear ?Mild AS murmur  ?Abdomen benign ?Right hip pain ?RLE cellulitis with central eschar and necrotic central area  ?  ?ECG  flutter rate 155 02/06/22  ? ?TELEMETRY: 02/09/2022 NSR rates 80;s  ? ? ?LABS: ?Basic Metabolic Panel: ?Recent Labs  ?Lab 02/06/22 ?1239 02/07/22 ?0316 02/09/22 ?0133  ?NA 132* 135 136  ?K 3.8 4.2 4.0  ?CL 98 103 105  ?CO2 22 24 25   ?GLUCOSE 103* 111* 121*  ?BUN 24* 18 17  ?CREATININE 0.68 0.61 0.70  ?CALCIUM 8.3* 8.0* 7.7*  ?MG  --  1.8  --   ?PHOS  --  3.0  --   ? ?Cardiac Enzymes: ?No results for input(s): CKTOTAL, CKMB, CKMBINDEX, TROPONINI in the last 72 hours. ?CBC: ?Recent Labs  ?  Lab 02/06/22 ?1239 02/07/22 ?0316 02/08/22 ?6967 02/09/22 ?0133  ?WBC 16.3* 15.0* 12.2* 11.4*  ?NEUTROABS 14.2*  --   --   --   ?HGB 8.5* 9.7* 8.2* 8.7*  ?HCT 26.5* 29.2* 24.7* 27.2*  ?MCV 81.3 80.7 81.3 81.7  ?PLT 478* 436* 371 364  ? ?PROTIME: ?Recent Labs  ?  02/07/22 ?1113 02/09/22 ?0133  ?LABPROT 16.1* 15.7*  ?INR 1.3* 1.3*  ? ?Liver Function Tests: ?Recent Labs  ?  02/06/22 ?1239 02/09/22 ?0133  ?AST 26 24  ?ALT 20 20  ?ALKPHOS 79 107  ?BILITOT 0.6 0.3  ?PROT 5.3* 4.6*  ?ALBUMIN 2.1* 1.5*  ? ?Recent Labs  ?  02/07/22 ?0316  ?CHOL 106  ?HDL 25*  ?LDLCALC 66  ?TRIG 73  ?CHOLHDL 4.2  ? ?Thyroid Function Tests: ?Recent Labs  ?  02/06/22 ?1311  ?TSH 4.449  ? ?Anemia Panel: ?Recent Labs  ?  02/07/22 ?0316  ?FERRITIN 238  ?TIBC 155*  ?IRON 21*  ? ?  ? ? ?ASSESSMENT AND PLAN: ? ?PAF:  converted to NSR converted to oral amiodarone by Dr Graciela Husbands 02/07/22 200 mg bid no mention of long term anticoagulation palliative care/DNR ? ?Cellulitis:  RLE looks pretty angry On Rocephin plan for debridement with Dr Lajoyce Corners today  ABI's pending may need vascular consult  ?  ? ?Signed, ?Charlton Haws MD ? ?02/09/2022 ? ?

## 2022-02-09 NOTE — Progress Notes (Deleted)
MD gave verbal order to advance NG tube 4-5 cm. NG tube advanced. Pt tolerated well. 600 cc of yellow-green drainage in canister since NG tube was placed this AM by Dr. Clark.  ?  ?Erhard Senske M ?

## 2022-02-09 NOTE — Progress Notes (Addendum)
? ?                                                                                                                                                     ?                                                   ?Daily Progress Note  ? ?Patient Name: Isabel Barnes       Date: 02/09/2022 ?DOB: 03/06/1941  Age: 81 y.o. MRN#: [id number] ?Attending Physician: Earl LagosNarendra, Nischal, MD ?Primary Care Physician: Isabel Barnes, Suzanne, PA-C ?Admit Date: 02/06/2022 ? ?Reason for Consultation/Follow-up: Establishing goals of care ? ?Subjective: ?Medical records reviewed. Patient assessed at the bedside. She is eating breakfast, reports right leg and back pain. I then added patient's son Isabel Barnes and daughter Isabel Barnes via conference call for scheduled family meeting. ? ?Reintroduced Palliative Medicine as specialized medical care for people living with serious illness. Reviewed [name]'s current illness and her concerns about whether she is able to live at home safely. She shares that she is no longer feeling that she is ready for end of life and is agreeable to proceed with debridement of her leg abscess, medical management of chronic illnesses and rehab for conditioning. She is primarily worried about the logistics of arranging caregiver support, paying for her needs. Discussed options including SNF, long-term care facilities, HH, and in-home caregivers at length. Outpatient palliative care was explained and offered. Patient's children are glad to hear she is not ready for hospice. We discussed her prognosis and the unpredictable nature of her leg wound. Discussed uncertainty of the cause of her weight loss and that hospice may become more necessary with failure to thrive. A MOST form was introduced. ? ? ?Questions and concerns addressed. PMT will continue to support holistically. ? ?Length of Stay: 3 ? ?Current Medications: ?Scheduled Meds:  ? sodium chloride   Intravenous Once  ? acetaminophen  1,000 mg Oral TID  ? amiodarone  200 mg Oral  BID  ? cycloSPORINE  1 drop Both Eyes Daily  ? pantoprazole  40 mg Oral Daily  ? predniSONE  5 mg Oral Q breakfast  ? rosuvastatin  5 mg Oral Daily  ? ? ?Continuous Infusions: ? cefTRIAXone (ROCEPHIN)  IV 2 g (02/08/22 1832)  ? vancomycin 1,250 mg (02/08/22 1113)  ? ? ?PRN Meds: ?HYDROcodone-acetaminophen, menthol-cetylpyridinium, methocarbamol, phenol ? ?Physical Exam ?Vitals and nursing note reviewed.  ?Constitutional:   ?   General: She is not in acute distress. ?   Appearance: She is ill-appearing.  ?Cardiovascular:  ?   Rate and Rhythm: Normal rate.  ?Pulmonary:  ?   Effort: Pulmonary effort is normal.  ?  Skin: ?   General: Skin is warm and dry.  ?Neurological:  ?   Mental Status: She is alert and oriented to person, place, and time.  ?Psychiatric:     ?   Mood and Affect: Mood normal.  ?         ? ?Vital Signs: BP (!) 114/55 (BP Location: Right Arm)   Pulse 68   Temp 97.7 ?F (36.5 ?C) (Oral)   Resp 20   Ht 5' (1.524 m)   Wt 49.4 kg   SpO2 93%   BMI 21.29 kg/m?  ?SpO2: SpO2: 93 % ?O2 Device: O2 Device: Room Air ?O2 Flow Rate:   ? ?Intake/output summary:  ?Intake/Output Summary (Last 24 hours) at 02/09/2022 0850 ?Last data filed at 02/09/2022 0743 ?Gross per 24 hour  ?Intake 360 ml  ?Output 200 ml  ?Net 160 ml  ? ?LBM: Last BM Date : 02/07/22 ?Baseline Weight: Weight: 49.4 kg ?Most recent weight: Weight: 49.4 kg ? ?     ?Palliative Assessment/Data: 60% ? ? ? ? ? ?Patient Active Problem List  ? Diagnosis Date Noted  ? Severe protein-calorie malnutrition (HCC)   ? PVD (peripheral vascular disease) (HCC)   ? Fall   ? Atrial fibrillation (HCC)   ? Goals of care, counseling/discussion   ? Anemia   ? Fluid collection (edema) in the arms, legs, hands and feet   ? Cellulitis of right lower extremity 02/06/2022  ? Chest pain, rule out acute myocardial infarction 03/16/2021  ? HTN (hypertension) 03/16/2021  ? Hypothyroidism 03/16/2021  ? HLD (hyperlipidemia) 03/16/2021  ? Aortic valve stenosis 03/16/2021  ? Chronic  venous insufficiency of lower extremity 03/16/2021  ? Short-term memory loss 12/13/2019  ? 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  ? Primary osteoarthritis of right knee 07/16/2016  ? Eosinophilia 06/16/2015  ? Chronic pain disorder   ? Osteoarthritis   ? Incisional umbilical hernia, without obstruction or gangrene   ? Iron deficiency anemia 09/19/2012  ? ? ?Palliative Care Assessment & Plan  ? ?Patient Profile: ?81 y.o. female  with past medical history of cerebellar stroke, iron deficiency anemia, GERD, HTN, hypothyroidism, aortic stenosis, ankylosing spondylitis, venous insufficiency and osteoarthritis admitted on 02/06/2022 with recurrent falls, weakness.  ?  ?Patient admitted for Afib likely secondary to acute cellulitis of the RLE. She does not wish for an aggressive workup of her conditions. PMT has been consulted to assist with goals of care conversation. ? ?Assessment: ?RLE cellulitis with necrotic abscess ?Aortic stenosis ?Afib with RVR ?Goals of care conversation ? ?Recommendations/Plan: ?DNR ?Continue current care, debridement of right calf tomorrow ?Patient is agreeable to SNF, outpatient palliative care ?Patient and family's goal is to determine her new baseline and arrange for a safe plan moving forward, considering additional caregiver support in the home vs long-term care after SNF ?Patient's son is planning to visit from [place] in the coming days and tour SNF options, he is appreciative of updates regarding disposition ?Patient's daughter wishes to speak with TOC, spoke with NCM via secure chat  ?Introduced MOST form today and emailed copy to patient's children ?PMT will continue to support ? ? ?Prognosis: ? Unable to determine ? ?Discharge Planning: ?Skilled Nursing Facility for rehab with Palliative care service follow-up ? ?Care plan was discussed with patient, daughter Isabel Cones, son Isabel Leriche, Dr. Keturah Shavers Swist NCM ? ? ?Total time: ?I spent 70 minutes in the care of the patient today in the above  activities and documenting the encounter. ?Prolonged time: Yes ? ? ?  Richardson Dopp, PA-C ?Palliative Medicine Team ?Team phone # [phone] ? ?Thank you for allowing the Palliative Medicine Team to assist in the care of this patient. Please utilize secure chat with additional questions, if there is no response within 30 minutes please call the above phone number. ? ?Palliative Medicine Team providers are available by phone from 7am to 7pm daily and can be reached through the team cell phone.  ?Should this patient require assistance outside of these hours, please call the patient's attending physician.  ? ? ? ?

## 2022-02-09 NOTE — TOC Initial Note (Signed)
Transition of Care (TOC) - Initial/Assessment Note  ? ? ?Patient Details  ?Name: Isabel Barnes ?MRN: [id number] ?Date of Birth: [birth year] ? ?Transition of Care (TOC) CM/SW Contact:    ?Vinie Sill, LCSW ?Phone Number: ?02/09/2022, 5:17 PM ? ?Clinical Narrative:                 ? ?CSW met with patient at bedside. CSW introduced self and explained role. CSW discussed therapy recommendation of short term rehab at [facility]. Patient states she lives home alone and is agreeable to short term rehab at [facility]. Patient acknowledges the need for rehab and the need to explore care options in the future. She states he has supportive children [name] & Margarita Grizzle) and they will be here to assist her through this journey. CSW explained the SNF process. She states no preferred SNF at this time. All questions answered.  ? ?CSW will provide bed offers once available ?CSW will continue to follow and assist with discharge planning.  ? ?Thurmond Butts, MSW, LCSW ?Clinical Social Worker ? ? ? ?Expected Discharge Plan: [place] ?Barriers to Discharge: Ship broker, Continued Medical Work up, SNF Pending bed offer ? ? ?Patient Goals and CMS Choice ?  ?  ?  ? ?Expected Discharge Plan and Services ?Expected Discharge Plan: [place] ?In-house Referral: Clinical Social Work ?  ?  ?Living arrangements for the past 2 months: [place] ?                ?  ?  ?  ?  ?  ?  ?  ?  ?  ?  ? ?Prior Living Arrangements/Services ?Living arrangements for the past 2 months: [place] ?Lives with:: Self ?Patient language and need for interpreter reviewed:: No ?       ?Need for Family Participation in Patient Care: Yes (Comment) ?Care giver support system in place?: Yes (comment) ?  ?Criminal Activity/Legal Involvement Pertinent to Current Situation/Hospitalization: No - Comment as needed ? ?Activities of Daily Living ?  ?  ? ?Permission Sought/Granted ?Permission sought to share information with : Family  Supports ?Permission granted to share information with : Yes, Verbal Permission Granted ? Share Information with NAME: Dewitt Rota ? Permission granted to share info w AGENCY: SNFs ? Permission granted to share info w Relationship: son ? Permission granted to share info w Contact Information: SNFs ? ?Emotional Assessment ?Appearance:: Appears stated age ?Attitude/Demeanor/Rapport: Engaged ?Affect (typically observed): Accepting, Pleasant, Appropriate ?Orientation: : Oriented to Self, Oriented to Place, Oriented to  Time, Oriented to Situation ?Alcohol / Substance Use: Not Applicable ?Psych Involvement: No (comment) ? ?Admission diagnosis:  Cellulitis of right lower extremity [L03.115] ?Fall, initial encounter [W19.XXXA] ?Anemia, unspecified type [D64.9] ?Atrial fibrillation, unspecified type ([place]) [I48.91] ?Patient Active Problem List  ? Diagnosis Date Noted  ? Severe protein-calorie malnutrition ([place])   ? PVD (peripheral vascular disease) ([place])   ? Fall   ? Atrial fibrillation ([place])   ? Goals of care, counseling/discussion   ? Anemia   ? Fluid collection (edema) in the arms, legs, hands and feet   ? Cellulitis of right lower extremity 02/06/2022  ? Chest pain, rule out acute myocardial infarction 03/16/2021  ? HTN (hypertension) 03/16/2021  ? Hypothyroidism 03/16/2021  ? HLD (hyperlipidemia) 03/16/2021  ? Aortic valve stenosis 03/16/2021  ? Chronic venous insufficiency of lower extremity 03/16/2021  ? Short-term memory loss 12/13/2019  ? Psoriatic arthritis ([place]) 12/13/2019  ? Primary osteoarthritis of right knee  07/16/2016  ? Eosinophilia 06/16/2015  ? Chronic pain disorder   ? Osteoarthritis   ? Incisional umbilical hernia, without obstruction or gangrene   ? Iron deficiency anemia 09/19/2012  ? ?PCP:  Ardith Dark, PA-C ?Pharmacy:   ?[id number] [place], [place]?[id number]?Phone: [phone]: [phone]? ? ? ? ?Social Determinants of Health (SDOH)  Interventions ?  ? ?Readmission Risk Interventions ?No flowsheet data found. ? ? ?

## 2022-02-09 NOTE — Progress Notes (Signed)
Physical Therapy Treatment ?Patient Details ?Name: Isabel Barnes ?MRN: [id number] ?DOB: [birth year] ?Today's Date: 02/09/2022 ? ? ?History of Present Illness Pt is a 81 y.o. F who presents 02/06/2022 after a fall. CT head negative for acute abnormality. Hip x-ray negative for acute fracture. Found to have new a-fib in setting of moderate to severe AS. Significant PMH: cerebellar stroke, anemia, HTN, aortic stenosis, ankylosing spondylitis, venous insufficiency. ? ?  ?PT Comments  ? ? Pt received in supine, agreeable to therapy session and c/o RLE pain despite recent premedication, pt denies having pain meds recently. Pt agreeable to attempt ice pack for R calf discomfort and agreeable to lateral scoot transfer from bed>chair, she was able to perform with minA and mod cues for proper technique. Pt attempted partial squats in preparation for standing trial but then reports pain is too high to attempt standing at RW. She may benefit from K-pad for low back pain if appropriate, she is agreeable to continue ice 20 mins on/off for RLE pain. Plan to initiate standing trials at RW next session if pain allows. Pt continues to benefit from PT services to progress toward functional mobility goals.    ?Recommendations for follow up therapy are one component of a multi-disciplinary discharge planning process, led by the attending physician.  Recommendations may be updated based on patient status, additional functional criteria and insurance authorization. ? ?Follow Up Recommendations ? Skilled nursing-short term rehab (<3 hours/day) ?  ?  ?Assistance Recommended at Discharge Frequent or constant Supervision/Assistance  ?Patient can return home with the following A little help with bathing/dressing/bathroom;Assistance with cooking/housework;Assist for transportation;Help with stairs or ramp for entrance;A lot of help with walking and/or transfers ?  ?Equipment Recommendations ? BSC/3in1  ?  ?Recommendations for Other Services    ? ? ?  ?Precautions / Restrictions Precautions ?Precautions: Fall ?Precaution Comments: premedicate, chronic LBP and calf extremely tender ?Restrictions ?Weight Bearing Restrictions: No  ?  ? ?Mobility ? Bed Mobility ?Overal bed mobility: Needs Assistance ?Bed Mobility: Rolling, Sidelying to Sit ?Rolling: Min guard ?Sidelying to sit: Min assist, HOB elevated ?  ?  ?  ?General bed mobility comments: toward R EOB, pt needing sequential cues for log rolling (as she reports chronic LBP), fair carryover of cues; needs minA to fully raise trunk and to remove LUE from rail to reach for bed/next to hip on L side ?  ? ?Transfers ?Overall transfer level: Needs assistance ?  ?Transfers: Bed to chair/wheelchair/BSC ?  ?  ?  ?  ?  ? Lateral/Scoot Transfers: Min assist, From elevated surface ?General transfer comment: cues for scooting toward less painful L side with emphasis on LLE WB (due to RLE pain), use of UE to scoot and pt with fair carryover of instruction within session; to drop arm recliner on her L side ?  ? ? ? ?  ?Balance Overall balance assessment: Needs assistance ?Sitting-balance support: Feet supported ?Sitting balance-Leahy Scale: Fair ?Sitting balance - Comments: pt guarding due to pain, some R lean due to L LBP and pt guarding RLE with UE causing minor instability at times; up to min guard for safety ?  ?  ?  ?Standing balance comment: pt defers due to RLE pain ?  ?  ?  ?  ?  ? ?  ?Cognition Arousal/Alertness: Awake/alert ?Behavior During Therapy: [facility] for tasks assessed/performed ?Overall Cognitive Status: Impaired/Different from baseline ?Area of Impairment: Memory, Safety/judgement ?  ?  ?  ?  ?  ?  ?  ?  ?  ?  ?  Memory: Decreased short-term memory ?  ?Safety/Judgement: Decreased awareness of safety ?  ?  ?General Comments: A&Ox4, STM deficits and pt premedicated ~20 mins prior to session but does not recall; pt pleasantly cooperative as able but perseverating on her dry skin. ?  ?  ? ?  ?Exercises   ? ?   ?General Comments General comments (skin integrity, edema, etc.): VSS on RA, no acute s/sx distress other than expected RLE and chronic LBP ?  ?  ? ?Pertinent Vitals/Pain Pain Assessment ?Pain Assessment: Faces ?Faces Pain Scale: Hurts even more ?Pain Location: R calf/distal to R knee, bilateral hips (L>R) ?Pain Descriptors / Indicators: Shooting, Sharp, Burning ?Pain Intervention(s): Limited activity within patient's tolerance, Monitored during session, Premedicated before session, Repositioned, Ice applied, Patient requesting pain meds-RN notified  ? ? ? ?PT Goals (current goals can now be found in the care plan section) Acute Rehab PT Goals ?Patient Stated Goal: less pain ?PT Goal Formulation: With patient ?Time For Goal Achievement: 02/21/22 ?Progress towards PT goals: Progressing toward goals ? ?  ?Frequency ? ? ? Min 3X/week ? ? ? ?  ?PT Plan Current plan remains appropriate  ? ? ?   ?AM-PAC PT "6 Clicks" Mobility   ?Outcome Measure ? Help needed turning from your back to your side while in a flat bed without using bedrails?: A Little ?Help needed moving from lying on your back to sitting on the side of a flat bed without using bedrails?: A Little ?Help needed moving to and from a bed to a chair (including a wheelchair)?: A Lot (mod cues) ?Help needed standing up from a chair using your arms (e.g., wheelchair or bedside chair)?: A Lot (mod cues) ?Help needed to walk in hospital room?: Total ?Help needed climbing 3-5 steps with a railing? : Total ?6 Click Score: 12 ? ?  ?End of Session Equipment Utilized During Treatment:  (bed transfer pad) ?Activity Tolerance: Patient limited by pain;Patient tolerated treatment well ?Patient left: in chair;with call bell/phone within reach;with chair alarm set ?Nurse Communication: Mobility status;Other (comment);Patient requests pain meds (have pt scoot back to bed via L side from drop arm recliner, pt in too much pain to stand; may benefit from K-pad for LBP if  appropriate?) ?PT Visit Diagnosis: Unsteadiness on feet (R26.81);Muscle weakness (generalized) (M62.81);History of falling (Z91.81) ?  ? ? ?Time: [id number] ?PT Time Calculation (min) (ACUTE ONLY): 28 min ? ?Charges:  $Therapeutic Activity: 23-37 mins          ?          ? ?Anastaisa Wooding P., PTA ?Acute Rehabilitation Services ?Pager: [phone] ?Office: [phone]  ? ? ?Lurlene Ronda M Kaiden Pech ?02/09/2022, 5:28 PM ? ?

## 2022-02-10 ENCOUNTER — Encounter (HOSPITAL_COMMUNITY): Payer: Medicare Other

## 2022-02-10 ENCOUNTER — Inpatient Hospital Stay (HOSPITAL_COMMUNITY): Payer: Medicare Other | Admitting: Anesthesiology

## 2022-02-10 ENCOUNTER — Encounter (HOSPITAL_COMMUNITY): Payer: Self-pay | Admitting: Internal Medicine

## 2022-02-10 ENCOUNTER — Encounter (HOSPITAL_COMMUNITY)
Admission: EM | Disposition: A | Discharge status: Skilled Nursing Facility | Payer: Self-pay | Source: Home / Self Care | Attending: Internal Medicine

## 2022-02-10 DIAGNOSIS — I10 Essential (primary) hypertension: Secondary | ICD-10-CM

## 2022-02-10 DIAGNOSIS — L02415 Cutaneous abscess of right lower limb: Secondary | ICD-10-CM

## 2022-02-10 DIAGNOSIS — E039 Hypothyroidism, unspecified: Secondary | ICD-10-CM

## 2022-02-10 DIAGNOSIS — D638 Anemia in other chronic diseases classified elsewhere: Secondary | ICD-10-CM

## 2022-02-10 DIAGNOSIS — L03115 Cellulitis of right lower limb: Secondary | ICD-10-CM | POA: Diagnosis not present

## 2022-02-10 LAB — CBC
HCT: 30.3 % — ABNORMAL LOW (ref 36.0–46.0)
Hemoglobin: 9.7 g/dL — ABNORMAL LOW (ref 12.0–15.0)
MCH: 26.6 pg (ref 26.0–34.0)
MCHC: 32 g/dL (ref 30.0–36.0)
MCV: 83 fL (ref 80.0–100.0)
Platelets: 450 10*3/uL — ABNORMAL HIGH (ref 150–400)
RBC: 3.65 MIL/uL — ABNORMAL LOW (ref 3.87–5.11)
RDW: 15.6 % — ABNORMAL HIGH (ref 11.5–15.5)
WBC: 10.9 10*3/uL — ABNORMAL HIGH (ref 4.0–10.5)
nRBC: 0 % (ref 0.0–0.2)

## 2022-02-10 LAB — BASIC METABOLIC PANEL
Anion gap: 5 (ref 5–15)
BUN: 16 mg/dL (ref 8–23)
CO2: 27 mmol/L (ref 22–32)
Calcium: 7.9 mg/dL — ABNORMAL LOW (ref 8.9–10.3)
Chloride: 106 mmol/L (ref 98–111)
Creatinine, Ser: 0.67 mg/dL (ref 0.44–1.00)
GFR, Estimated: >60 mL/min (ref >60)
Glucose, Bld: 95 mg/dL (ref 70–99)
Potassium: 5.4 mmol/L — ABNORMAL HIGH (ref 3.5–5.1)
Sodium: 138 mmol/L (ref 135–145)

## 2022-02-10 LAB — PTT FACTOR INHIBITOR (MIXING STUDY): aPTT: 25.9 s (ref 22.9–30.2)

## 2022-02-10 SURGERY — IRRIGATION AND DEBRIDEMENT EXTREMITY
Anesthesia: General | Laterality: Right

## 2022-02-10 MED ORDER — DEXAMETHASONE SODIUM PHOSPHATE 10 MG/ML IJ SOLN
INTRAMUSCULAR | Status: DC | PRN
  Administered 2022-02-10: 5 mg via INTRAVENOUS

## 2022-02-10 MED ORDER — MEPERIDINE HCL 25 MG/ML IJ SOLN: 6.2500 mg | INTRAMUSCULAR | Status: DC | PRN

## 2022-02-10 MED ORDER — POLYETHYLENE GLYCOL 3350 17 G PO PACK: 17.0000 g | PACK | Freq: Once | ORAL | Status: DC

## 2022-02-10 MED ORDER — FENTANYL CITRATE (PF) 100 MCG/2ML IJ SOLN
INTRAMUSCULAR | Status: AC
  Filled 2022-02-10: qty 2

## 2022-02-10 MED ORDER — ONDANSETRON HCL 4 MG/2ML IJ SOLN
INTRAMUSCULAR | Status: AC
  Filled 2022-02-10: qty 2

## 2022-02-10 MED ORDER — ONDANSETRON HCL 4 MG/2ML IJ SOLN
INTRAMUSCULAR | Status: DC | PRN
  Administered 2022-02-10: 4 mg via INTRAVENOUS

## 2022-02-10 MED ORDER — WHITE PETROLATUM EX OINT: TOPICAL_OINTMENT | CUTANEOUS | Status: DC | PRN

## 2022-02-10 MED ORDER — TRAMADOL HCL 50 MG PO TABS
50.0000 mg | ORAL_TABLET | Freq: Four times a day (QID) | ORAL | Status: DC | PRN
  Administered 2022-02-10 – 2022-02-11 (×4): 50 mg via ORAL
  Filled 2022-02-10 (×4): qty 1

## 2022-02-10 MED ORDER — OXYCODONE HCL 5 MG PO TABS
ORAL_TABLET | ORAL | Status: AC
Start: 2022-02-10 — End: 2022-02-11
  Filled 2022-02-10: qty 1

## 2022-02-10 MED ORDER — HYDROMORPHONE HCL 1 MG/ML IJ SOLN: 0.5000 mg | INTRAMUSCULAR | Status: DC | PRN

## 2022-02-10 MED ORDER — PROPOFOL 10 MG/ML IV BOLUS
INTRAVENOUS | Status: AC
  Filled 2022-02-10: qty 20

## 2022-02-10 MED ORDER — CHLORHEXIDINE GLUCONATE 0.12 % MT SOLN
15.0000 mL | Freq: Once | OROMUCOSAL | Status: AC
  Administered 2022-02-10: 15 mL via OROMUCOSAL
  Filled 2022-02-10: qty 15

## 2022-02-10 MED ORDER — DEXAMETHASONE SODIUM PHOSPHATE 10 MG/ML IJ SOLN
INTRAMUSCULAR | Status: AC
  Filled 2022-02-10: qty 1

## 2022-02-10 MED ORDER — OXYCODONE HCL 5 MG PO TABS
5.0000 mg | ORAL_TABLET | ORAL | Status: DC | PRN
Start: 2022-02-10 — End: 2022-02-10

## 2022-02-10 MED ORDER — FENTANYL CITRATE (PF) 100 MCG/2ML IJ SOLN
INTRAMUSCULAR | Status: DC | PRN
  Administered 2022-02-10: 25 ug via INTRAVENOUS
  Administered 2022-02-10: 50 ug via INTRAVENOUS
  Administered 2022-02-10 (×3): 25 ug via INTRAVENOUS
  Administered 2022-02-10: 50 ug via INTRAVENOUS
  Administered 2022-02-10 (×2): 25 ug via INTRAVENOUS

## 2022-02-10 MED ORDER — OXYCODONE HCL 5 MG PO TABS
5.0000 mg | ORAL_TABLET | Freq: Once | ORAL | Status: AC
  Administered 2022-02-10: 5 mg via ORAL

## 2022-02-10 MED ORDER — 0.9 % SODIUM CHLORIDE (POUR BTL) OPTIME
TOPICAL | Status: DC | PRN
  Administered 2022-02-10: 1000 mL

## 2022-02-10 MED ORDER — ORAL CARE MOUTH RINSE: 15.0000 mL | Freq: Once | OROMUCOSAL | Status: AC

## 2022-02-10 MED ORDER — MORPHINE SULFATE (PF) 4 MG/ML IV SOLN
4.0000 mg | Freq: Once | INTRAVENOUS | Status: AC
  Administered 2022-02-10: 4 mg via INTRAVENOUS

## 2022-02-10 MED ORDER — FENTANYL CITRATE (PF) 250 MCG/5ML IJ SOLN
INTRAMUSCULAR | Status: AC
  Filled 2022-02-10: qty 5

## 2022-02-10 MED ORDER — MIDAZOLAM HCL 2 MG/2ML IJ SOLN: 0.5000 mg | Freq: Once | INTRAMUSCULAR | Status: DC | PRN

## 2022-02-10 MED ORDER — LACTATED RINGERS IV SOLN: INTRAVENOUS | Status: DC

## 2022-02-10 MED ORDER — FENTANYL CITRATE (PF) 100 MCG/2ML IJ SOLN
25.0000 ug | INTRAMUSCULAR | Status: DC | PRN
  Administered 2022-02-10 (×3): 50 ug via INTRAVENOUS

## 2022-02-10 MED ORDER — MORPHINE SULFATE (PF) 4 MG/ML IV SOLN
INTRAVENOUS | Status: AC
  Filled 2022-02-10: qty 1

## 2022-02-10 MED ORDER — FENTANYL CITRATE (PF) 100 MCG/2ML IJ SOLN
INTRAMUSCULAR | Status: AC
Start: 2022-02-10 — End: 2022-02-11
  Filled 2022-02-10: qty 2

## 2022-02-10 MED ORDER — LIDOCAINE 2% (20 MG/ML) 5 ML SYRINGE
INTRAMUSCULAR | Status: DC | PRN
  Administered 2022-02-10: 40 mg via INTRAVENOUS

## 2022-02-10 MED ORDER — PROPOFOL 10 MG/ML IV BOLUS
INTRAVENOUS | Status: DC | PRN
  Administered 2022-02-10: 100 mg via INTRAVENOUS

## 2022-02-10 SURGICAL SUPPLY — 31 items
BAG COUNTER SPONGE SURGICOUNT (BAG) IMPLANT
BAG SPNG CNTER NS LX DISP (BAG)
BLADE SURG 21 STRL SS (BLADE) ×2 IMPLANT
BNDG COHESIVE 6X5 TAN STRL LF (GAUZE/BANDAGES/DRESSINGS) IMPLANT
BNDG GAUZE ELAST 4 BULKY (GAUZE/BANDAGES/DRESSINGS) ×4 IMPLANT
COVER SURGICAL LIGHT HANDLE (MISCELLANEOUS) ×4 IMPLANT
DRAPE U-SHAPE 47X51 STRL (DRAPES) ×2 IMPLANT
DRSG ADAPTIC 3X8 NADH LF (GAUZE/BANDAGES/DRESSINGS) ×2 IMPLANT
DURAPREP 26ML APPLICATOR (WOUND CARE) ×2 IMPLANT
ELECT REM PT RETURN 9FT ADLT (ELECTROSURGICAL)
ELECTRODE REM PT RTRN 9FT ADLT (ELECTROSURGICAL) IMPLANT
GAUZE SPONGE 4X4 12PLY STRL (GAUZE/BANDAGES/DRESSINGS) ×2 IMPLANT
GLOVE SURG ORTHO LTX SZ9 (GLOVE) ×2 IMPLANT
GLOVE SURG UNDER POLY LF SZ9 (GLOVE) ×2 IMPLANT
GOWN STRL REUS W/ TWL XL LVL3 (GOWN DISPOSABLE) ×2 IMPLANT
GOWN STRL REUS W/TWL XL LVL3 (GOWN DISPOSABLE) ×4
HANDPIECE INTERPULSE COAX TIP (DISPOSABLE)
KIT BASIN OR (CUSTOM PROCEDURE TRAY) ×2 IMPLANT
KIT TURNOVER KIT B (KITS) ×2 IMPLANT
MANIFOLD NEPTUNE II (INSTRUMENTS) ×2 IMPLANT
NS IRRIG 1000ML POUR BTL (IV SOLUTION) ×2 IMPLANT
PACK ORTHO EXTREMITY (CUSTOM PROCEDURE TRAY) ×2 IMPLANT
PAD ARMBOARD 7.5X6 YLW CONV (MISCELLANEOUS) ×4 IMPLANT
SET HNDPC FAN SPRY TIP SCT (DISPOSABLE) IMPLANT
STOCKINETTE IMPERVIOUS 9X36 MD (GAUZE/BANDAGES/DRESSINGS) IMPLANT
SUT ETHILON 2 0 PSLX (SUTURE) ×2 IMPLANT
SWAB COLLECTION DEVICE MRSA (MISCELLANEOUS) ×2 IMPLANT
SWAB CULTURE ESWAB REG 1ML (MISCELLANEOUS) IMPLANT
TOWEL GREEN STERILE (TOWEL DISPOSABLE) ×2 IMPLANT
TUBE CONNECTING 12X1/4 (SUCTIONS) ×2 IMPLANT
YANKAUER SUCT BULB TIP NO VENT (SUCTIONS) ×2 IMPLANT

## 2022-02-10 NOTE — Progress Notes (Signed)
Physical Therapy Treatment ?Patient Details ?Name: Isabel Barnes ?MRN: [id number] ?DOB: 1941-10-26 ?Today's Date: 02/10/2022 ? ? ?History of Present Illness Pt is a 81 y.o. F who presents 02/06/2022 after a fall. CT head negative for acute abnormality. Hip x-ray negative for acute fracture. Found to have new a-fib in setting of moderate to severe AS. Significant PMH: cerebellar stroke, anemia, HTN, aortic stenosis, ankylosing spondylitis, venous insufficiency. ? ?  ?PT Comments  ? ? Pt received in supine, agreeable to therapy session with encouragement, limited due to c/o severe R calf and low back pain. Pt able to perform bed mobility (rolling and upright to long sit in bed) with up to min guard and defers EOB/standing due to pain. Pt performed supine /seated UE/LE exercises with fair tolerance, at times needs break after 5 reps due to pain. Pt RN entering room to prepare her for OR for procedure at end of session, therefore defer OOB to chair encouragement as she is leaving imminently for I&D. Pt continues to benefit from PT services to progress toward functional mobility goals.    ?Recommendations for follow up therapy are one component of a multi-disciplinary discharge planning process, led by the attending physician.  Recommendations may be updated based on patient status, additional functional criteria and insurance authorization. ? ?Follow Up Recommendations ? Skilled nursing-short term rehab (<3 hours/day) ?  ?  ?Assistance Recommended at Discharge Frequent or constant Supervision/Assistance  ?Patient can return home with the following A little help with bathing/dressing/bathroom;Assistance with cooking/housework;Assist for transportation;Help with stairs or ramp for entrance;A lot of help with walking and/or transfers ?  ?Equipment Recommendations ? BSC/3in1  ?  ?Recommendations for Other Services   ? ? ?  ?Precautions / Restrictions Precautions ?Precautions: Fall ?Precaution Comments: premedicate, chronic  LBP and R calf wound extremely tender ?Restrictions ?Weight Bearing Restrictions: No  ?  ? ?Mobility ? Bed Mobility ?Overal bed mobility: Needs Assistance ?Bed Mobility: Supine to Sit, Rolling ?Rolling: Supervision ?  ?Supine to sit: Min guard ?  ?  ?General bed mobility comments: supine to long sit x2 reps, rolling to L/R sides for removal of pt robe prior to OR; pt with increased back pain in long sit ?  ? ?Transfers ?  ?  ?  ?  ?  ?  ?  ?  ?  ?General transfer comment: defer OOB to chair as RN arriving to room to prepare pt for OR ?  ? ?Ambulation/Gait ?  ?  ?  ?  ?  ?  ?  ?  ? ? ?Stairs ?  ?  ?  ?  ?  ? ? ?Wheelchair Mobility ?  ? ?Modified Rankin (Stroke Patients Only) ?  ? ? ?  ?Balance Overall balance assessment: Needs assistance ?Sitting-balance support: Feet supported ?Sitting balance-Leahy Scale: Fair ?Sitting balance - Comments: long sit in bed no LOB but using BUE support due to c/o LBP ?  ?  ?  ?  ?  ?  ?  ?  ?  ?  ?  ?  ?  ?  ?  ?  ? ?  ?Cognition Arousal/Alertness: Awake/alert ?Behavior During Therapy: [facility] for tasks assessed/performed ?Overall Cognitive Status: Impaired/Different from baseline ?Area of Impairment: Memory, Safety/judgement ?  ?  ?  ?  ?  ?  ?  ?  ?  ?  ?Memory: Decreased short-term memory ?  ?Safety/Judgement: Decreased awareness of safety ?  ?  ?General Comments: A&Ox4, STM deficits, pt recalls working with  PTA and sitting EOB previous date but does not recall transferring OOB to chair yesterday during session. pt pleasantly cooperative as able but perseverating on her dry skin. ?  ?  ? ?  ?Exercises Other Exercises ?Other Exercises: supine BLE AROM: ankle pumps, heel slides, hip abduction (AA on R), SLR (AA), SAQ x5-10 reps ea (as pain allows, pt unable to perform >5 SLR due to pain) ?Other Exercises: reclined BUE AROM: chest press x15 reps ?Other Exercises: seated lateral leans with elbow taps x3 reps in long sit ?Other Exercises: rolling L/R for core strengthening x2 reps ea ? ?   ?General Comments General comments (skin integrity, edema, etc.): VSS on RA, HR 60's bpm with exertion ?  ?  ? ?Pertinent Vitals/Pain Pain Assessment ?Pain Assessment: 0-10 ?Pain Score: 9  ?Pain Location: R calf/distal to R knee, bilateral hips (L>R) ?Pain Descriptors / Indicators: Shooting, Sharp, Burning, Grimacing ?Pain Intervention(s): Limited activity within patient's tolerance, Monitored during session, Repositioned, Premedicated before session (repositioned in bed for comfort, RLE elevated with heel floated; pain appears less than pt reports and HR stable with exercise despite c/o)  ? ? ?Home Living   ?  ?  ?  ?  ?  ?  ?  ?  ?  ?   ?  ?Prior Function    ?  ?  ?   ? ?PT Goals (current goals can now be found in the care plan section) Acute Rehab PT Goals ?Patient Stated Goal: less pain ?PT Goal Formulation: With patient ?Time For Goal Achievement: 02/21/22 ?Progress towards PT goals: Progressing toward goals ? ?  ?Frequency ? ? ? Min 3X/week ? ? ? ?  ?PT Plan Current plan remains appropriate  ? ? ?Co-evaluation   ?  ?  ?  ?  ? ?  ?AM-PAC PT "6 Clicks" Mobility   ?Outcome Measure ? Help needed turning from your back to your side while in a flat bed without using bedrails?: A Little ?Help needed moving from lying on your back to sitting on the side of a flat bed without using bedrails?: A Little ?Help needed moving to and from a bed to a chair (including a wheelchair)?: A Lot (mod cues) ?Help needed standing up from a chair using your arms (e.g., wheelchair or bedside chair)?: A Lot (mod cues) ?Help needed to walk in hospital room?: Total ?Help needed climbing 3-5 steps with a railing? : Total ?6 Click Score: 12 ? ?  ?End of Session   ?Activity Tolerance: Patient limited by pain ?Patient left: with call bell/phone within reach;in bed;with nursing/sitter in room (RN preparing pt for OR) ?Nurse Communication: Mobility status ?PT Visit Diagnosis: Unsteadiness on feet (R26.81);Muscle weakness (generalized)  (M62.81);History of falling (Z91.81) ?  ? ? ?Time: [id number] ?PT Time Calculation (min) (ACUTE ONLY): 17 min ? ?Charges:  $Therapeutic Exercise: 8-22 mins          ?          ? ?Sherrika Weakland P., PTA ?Acute Rehabilitation Services ?Pager: [phone] ?Office: [phone]  ? ? ?Kymoni Lesperance M Rivers Hamrick ?02/10/2022, 1:04 PM ? ?

## 2022-02-10 NOTE — Care Management Important Message (Signed)
Important Message ? ?Patient Details  ?Name: Isabel Barnes ?MRN: [id number] ?Date of Birth: [birth year] ? ? ?Medicare Important Message Given:  Yes ? ? ? ? ?Shelda Altes ?02/10/2022, 7:51 AM ?

## 2022-02-10 NOTE — Interval H&P Note (Signed)
History and Physical Interval Note: ? ?02/10/2022 ?6:56 AM ? ?[name]  has presented today for surgery, with the diagnosis of Right Leg Abscess.  The various methods of treatment have been discussed with the patient and family. After consideration of risks, benefits and other options for treatment, the patient has consented to  Procedure(s): ?DEBRIDEMENT RIGHT LEG ABSCESS (Right) as a surgical intervention.  The patient's history has been reviewed, patient examined, no change in status, stable for surgery.  I have reviewed the patient's chart and labs.  Questions were answered to the patient's satisfaction.   ? ? ?Isabel Barnes ? ? ?

## 2022-02-10 NOTE — Anesthesia Preprocedure Evaluation (Addendum)
Anesthesia Evaluation  ?Patient identified by MRN, date of birth, ID band ?Patient awake ? ? ? ?Reviewed: ?Allergy & Precautions, NPO status , Patient's Chart, lab work & pertinent test results ? ?History of Anesthesia Complications ?(+) POST - OP SPINAL HEADACHE ? ?Airway ?Mallampati: II ? ?TM Distance: >3 FB ?Neck ROM: Full ? ? ? Dental ? ?(+) Caps, Dental Advisory Given, Missing ?  ?Pulmonary ?shortness of breath, COPD, former smoker,  ?  ?breath sounds clear to auscultation ? ? ? ? ? ? Cardiovascular ?hypertension, Pt. on medications ?+ Peripheral Vascular Disease  ?+ dysrhythmias Atrial Fibrillation + Valvular Problems/Murmurs AS  ?Rhythm:Regular Rate:Normal ? ?02/08/2022 ECHO: EF 60-65%. The LV has normal function, no regional wall motion abnormalities. Grade II diastolic dysfunction, large L pleural effusion, small pericardial effusion, normal FVF, mod AS with mean gradient 24 mmHg ?  ?Neuro/Psych ? Headaches, Depression   ? GI/Hepatic ?GERD  Medicated,  ?Endo/Other  ?Hypothyroidism  ? Renal/GU ?  ? ?  ?Musculoskeletal ? ?(+) Arthritis , Osteoarthritis,   ? Abdominal ?  ?Peds ? Hematology ? ?(+) Blood dyscrasia (Hb 9.7), anemia ,   ?Anesthesia Other Findings ? ? Reproductive/Obstetrics ? ?  ? ? ? ? ? ? ? ? ? ? ? ? ? ?  ?  ? ? ? ? ? ? ? ?Anesthesia Physical ?Anesthesia Plan ? ?ASA: 3 ? ?Anesthesia Plan: General  ? ?Post-op Pain Management: Tylenol PO (pre-op)*  ? ?Induction: Intravenous ? ?PONV Risk Score and Plan: 3 and Ondansetron, Dexamethasone and Treatment may vary due to age or medical condition ? ?Airway Management Planned: LMA ? ?Additional Equipment: None ? ?Intra-op Plan:  ? ?Post-operative Plan:  ? ?Informed Consent: I have reviewed the patients History and Physical, chart, labs and discussed the procedure including the risks, benefits and alternatives for the proposed anesthesia with the patient or authorized representative who has indicated his/her understanding  and acceptance.  ? ?Patient has DNR.  ?Suspend DNR and Discussed DNR with patient. ?  ?Dental advisory given ? ?Plan Discussed with: CRNA and Surgeon ? ?Anesthesia Plan Comments:   ? ? ? ? ? ?Anesthesia Quick Evaluation ? ?

## 2022-02-10 NOTE — Anesthesia Postprocedure Evaluation (Signed)
Anesthesia Post Note ? ?Patient: Isabel Barnes ? ?Procedure(s) Performed: DEBRIDEMENT RIGHT LEG ABSCESS (Right) ? ?  ? ?Patient location during evaluation: PACU ?Anesthesia Type: General ?Level of consciousness: awake and alert, oriented and patient cooperative ?Pain management: pain level controlled (pain improving) ?Vital Signs Assessment: post-procedure vital signs reviewed and stable ?Respiratory status: spontaneous breathing, nonlabored ventilation, respiratory function stable and patient connected to nasal cannula oxygen ?Cardiovascular status: blood pressure returned to baseline and stable ?Postop Assessment: no apparent nausea or vomiting ?Anesthetic complications: no ? ? ?No notable events documented. ? ?Last Vitals:  ?Vitals:  ? 02/10/22 1336 02/10/22 1600  ?BP: (!) 131/43 127/66  ?Pulse: 65 70  ?Resp: 17 16  ?Temp: 37 ?C 36.4 ?C  ?SpO2: 96% 96%  ?  ?Last Pain:  ?Vitals:  ? 02/10/22 1600  ?TempSrc:   ?PainSc: 0-No pain  ? ? ?  ?  ?  ?  ?  ?  ? ?Isabel Barnes,E. Lonza Shimabukuro ? ? ? ? ?

## 2022-02-10 NOTE — Progress Notes (Signed)
Occupational Therapy Treatment ?Patient Details ?Name: Isabel Barnes ?MRN: [id number] ?DOB: Jan 27, 1941 ?Today's Date: 02/10/2022 ? ? ?History of present illness Pt is a 81 y.o. F who presents 02/06/2022 after a fall. CT head negative for acute abnormality. Hip x-ray negative for acute fracture. Found to have new a-fib in setting of moderate to severe AS. Significant PMH: cerebellar stroke, anemia, HTN, aortic stenosis, ankylosing spondylitis, venous insufficiency. ?  ?OT comments ? Patient continues to make incremental progress towards goals in skilled OT session. Patient's session encompassed ADLs and transfer to [facility]. Patient significantly limited by pain, requiring increased time to initiate all movement (pain meds given by RN in session). Patient with minimal impulsivity in standing due to urgency to have a BM, with mod A in order to complete transfer to [facility] Patient requiring increased time to have BM, therefore left with call bell in reach, with both RN and NT notified of patients location at end of session. Discharge remains appropriate, therapy will continue to follow.     ? ?Recommendations for follow up therapy are one component of a multi-disciplinary discharge planning process, led by the attending physician.  Recommendations may be updated based on patient status, additional functional criteria and insurance authorization. ?   ?Follow Up Recommendations ? Skilled nursing-short term rehab (<3 hours/day)  ?  ?Assistance Recommended at Discharge Frequent or constant Supervision/Assistance  ?Patient can return home with the following ? A little help with walking and/or transfers;A little help with bathing/dressing/bathroom;Assistance with cooking/housework;Direct supervision/assist for medications management;Assist for transportation;Help with stairs or ramp for entrance ?  ?Equipment Recommendations ?    ?  ?Recommendations for Other Services   ? ?  ?Precautions / Restrictions Precautions ?Precautions:  Fall ?Precaution Comments: premedicate, chronic LBP and R calf wound extremely tender ?Restrictions ?Weight Bearing Restrictions: No  ? ? ?  ? ?Mobility Bed Mobility ?Overal bed mobility: Needs Assistance ?Bed Mobility: Supine to Sit, Rolling ?Rolling: Supervision ?  ?Supine to sit: Min assist ?  ?  ?General bed mobility comments: min A to with use of bed pad to come into sitting EOB increased pain at R hip requiring significant extra time ?  ? ?Transfers ?Overall transfer level: Needs assistance ?Equipment used: Rolling walker (2 wheels), 1 person hand held assist ?Transfers: Bed to chair/wheelchair/BSC ?  ?Stand pivot transfers: Mod assist ?  ?  ?  ?  ?General transfer comment: mod A to steady when transitioning to EOB, patient impulsively standing prior to RW in front of her due to urgency ?  ?  ?Balance Overall balance assessment: Needs assistance ?Sitting-balance support: Feet supported, Single extremity supported ?Sitting balance-Leahy Scale: Fair ?  ?  ?Standing balance support: Bilateral upper extremity supported, Single extremity supported ?Standing balance-Leahy Scale: Poor ?Standing balance comment: reliant on external support ?  ?  ?  ?  ?  ?  ?  ?  ?  ?  ?  ?   ? ?ADL either performed or assessed with clinical judgement  ? ?ADL Overall ADL's : Needs assistance/impaired ?  ?  ?Grooming: Set up;Sitting;Wash/dry hands;Wash/dry face;Oral care ?Grooming Details (indicate cue type and reason): oral hygiene completed EOB ?  ?  ?  ?  ?  ?  ?  ?  ?Toilet Transfer: Moderate assistance;Rolling walker (2 wheels);BSC/3in1 ?Toilet Transfer Details (indicate cue type and reason): BSC due to urgency ?  ?  ?  ?  ?Functional mobility during ADLs: Moderate assistance ?General ADL Comments: limited by pain ?  ? ?  Extremity/Trunk Assessment   ?  ?  ?  ?  ?  ? ?Vision   ?  ?  ?Perception   ?  ?Praxis   ?  ? ?Cognition Arousal/Alertness: Awake/alert ?Behavior During Therapy: [facility] for tasks assessed/performed ?Overall Cognitive  Status: Within Functional Limits for tasks assessed ?  ?  ?  ?  ?  ?  ?  ?  ?  ?  ?  ?  ?  ?  ?  ?  ?  ?  ?  ?   ?Exercises   ? ?  ?Shoulder Instructions   ? ? ?  ?General Comments VSS on RA, HR 60's bpm with exertion  ? ? ?Pertinent Vitals/ Pain       Pain Assessment ?Pain Assessment: Faces ?Faces Pain Scale: Hurts even more ?Pain Location: R calf/distal to R knee, bilateral hips (L>R) ?Pain Descriptors / Indicators: Shooting, Sharp, Burning, Grimacing ?Pain Intervention(s): Limited activity within patient's tolerance, Monitored during session, Repositioned, Premedicated before session ? ?Home Living   ?  ?  ?  ?  ?  ?  ?  ?  ?  ?  ?  ?  ?  ?  ?  ?  ?  ?  ? ?  ?Prior Functioning/Environment    ?  ?  ?  ?   ? ?Frequency ? Min 2X/week  ? ? ? ? ?  ?Progress Toward Goals ? ?OT Goals(current goals can now be found in the care plan section) ? Progress towards OT goals: Progressing toward goals ? ?Acute Rehab OT Goals ?Patient Stated Goal: less pain ?OT Goal Formulation: With patient ?Time For Goal Achievement: 02/21/22 ?Potential to Achieve Goals: Good  ?Plan Discharge plan remains appropriate   ? ?Co-evaluation ? ? ?   ?  ?  ?  ?  ? ?  ?AM-PAC OT "6 Clicks" Daily Activity     ?Outcome Measure ? ? Help from another person eating meals?: None ?Help from another person taking care of personal grooming?: A Little ?Help from another person toileting, which includes using toliet, bedpan, or urinal?: A Lot ?Help from another person bathing (including washing, rinsing, drying)?: A Lot ?Help from another person to put on and taking off regular upper body clothing?: A Little ?Help from another person to put on and taking off regular lower body clothing?: A Lot ?6 Click Score: 16 ? ?  ?End of Session Equipment Utilized During Treatment: Rolling walker (2 wheels) ? ?OT Visit Diagnosis: Unsteadiness on feet (R26.81);Other abnormalities of gait and mobility (R26.89);History of falling (Z91.81);Muscle weakness (generalized)  (M62.81);Pain ?  ?Activity Tolerance Patient tolerated treatment well;Patient limited by pain ?  ?Patient Left Other (comment);with call bell/phone within reach (on [facility]) ?  ?Nurse Communication Mobility status;Other (comment) (on BSC but needing extra time for BM) ?  ? ?   ? ?Time: 8832-5498 ?OT Time Calculation (min): 32 min ? ?Charges: OT General Charges ?$OT Visit: 1 Visit ?OT Treatments ?$Self Care/Home Management : 23-37 mins ? ?Pollyann Glen E. Advait Buice, OTR/L ?Acute Rehabilitation Services ?[phone] ?[phone]  ? ?Pollyann Glen Simone Tuckey ?02/10/2022, 2:58 PM ? ? ?

## 2022-02-10 NOTE — Progress Notes (Signed)
[facility] ([facility])  Hospital Liaison: RN note         This patient has been referred to our palliative care services in the community.  [facility] will continue to follow for any discharge planning needs and to coordinate continuation of palliative care in the outpatient setting.    If you have questions or need assistance, please call [phone] or contact the hospital Liaison listed on AMION.      Thank you for this referral.         Mary Anne Robertson, RN, CCM  [facility] Hospital Liaison   [phone] 

## 2022-02-10 NOTE — Op Note (Signed)
02/10/2022 ? ?3:54 PM ? ?PATIENT:  Isabel Barnes   ? ?PRE-OPERATIVE DIAGNOSIS:  Right Leg Abscess ? ?POST-OPERATIVE DIAGNOSIS:  Same ? ?PROCEDURE:  DEBRIDEMENT RIGHT LEG ABSCESS ?Excisional debridement of skin and soft tissue and fascia. ?Application of cleanse choice wound VAC sponge.` ? ?SURGEON:  Newt Minion, MD ? ?PHYSICIAN ASSISTANT:None ?ANESTHESIA:   General ? ?PREOPERATIVE INDICATIONS:  Isabel Barnes is a  81 y.o. female with a diagnosis of Right Leg Abscess who failed conservative measures and elected for surgical management.   ? ?The risks benefits and alternatives were discussed with the patient preoperatively including but not limited to the risks of infection, bleeding, nerve injury, cardiopulmonary complications, the need for revision surgery, among others, and the patient was willing to proceed. ? ?OPERATIVE IMPLANTS: Cleanse choice Praveena sponge x1 ? ?@ENCIMAGES @ ? ?OPERATIVE FINDINGS: Massive necrotic soft tissue sent for cultures. ? ?OPERATIVE PROCEDURE: Patient brought the operating room underwent a general anesthetic.  After adequate levels anesthesia were obtained patient's leg was first prepped using ChloraPrep scrub then prepped using ChloraPrep draped into a sterile field a timeout was called.  Elliptical incision was made around the necrotic ulcer this left an incision 13 x 6 cm.  A Cobb elevator was used to further debride the necrotic soft tissue and this was green in color.  This was sent for cultures.  The wound was irrigated with normal saline the underlying muscle and fascia appeared intact and viable.  A Praveena cleanse choice sponge was applied covered with derma tack this had a good suction fit overwrapped with Coban patient was extubated taken the PACU in stable condition ? ?Debridement type: Excisional Debridement ? ?Side: right ? ?Body Location: lower leg  ? ?Tools used for debridement: scalpel, curette, and rongeur ? ?Pre-debridement Wound size (cm):   Length: 5         Width: 4     Depth: 1  ? ?Post-debridement Wound size (cm):   Length: 13        Width: 6     Depth: 2  ? ?Debridement depth beyond dead/damaged tissue down to healthy viable tissue: yes ? ?Tissue layer involved: skin, subcutaneous tissue ? ?Nature of tissue removed: Slough, Necrotic, Devitalized Tissue, Non-viable tissue, and Purulence ? ?Irrigation volume: 1 liter ?    ? ?Irrigation fluid type: Normal Saline ? ? ? ? ?DISCHARGE PLANNING: ? ?Antibiotic duration: Continue IV antibiotics adjust according to culture sensitivities ? ?Weightbearing: Weightbearing as tolerated on the right ? ?Pain medication: Opioid pathway ? ?Dressing care/ Wound VAC: Continue wound VAC ? ?Ambulatory devices: Walker ? ?Discharge to: Anticipate return to the operating room Friday or Saturday. ? ?Follow-up: In the office 1 week post operative. ? ? ? ? ? ? ?  ?

## 2022-02-10 NOTE — Transfer of Care (Signed)
Immediate Anesthesia Transfer of Care Note ? ?Patient: Isabel Barnes ? ?Procedure(s) Performed: DEBRIDEMENT RIGHT LEG ABSCESS (Right) ? ?Patient Location: PACU ? ?Anesthesia Type:General ? ?Level of Consciousness: awake, alert  and oriented ? ?Airway & Oxygen Therapy: Patient Spontanous Breathing and Patient connected to face mask oxygen ? ?Post-op Assessment: Report given to RN and Post -op Vital signs reviewed and stable ? ?Post vital signs: Reviewed and stable ? ?Last Vitals:  ?Vitals Value Taken Time  ?BP 107/58 02/10/22 1554  ?Temp    ?Pulse 71 02/10/22 1556  ?Resp 15 02/10/22 1556  ?SpO2 98 % 02/10/22 1556  ?Vitals shown include unvalidated device data. ? ?Last Pain:  ?Vitals:  ? 02/10/22 1226  ?TempSrc: Oral  ?PainSc:   ?   ? ?Patients Stated Pain Goal: 0 (02/09/22 1106) ? ?Complications: No notable events documented. ?

## 2022-02-10 NOTE — Progress Notes (Signed)
4E called, spoke with [place], Mehama and she will come pick up pt watch.  ?

## 2022-02-10 NOTE — Progress Notes (Addendum)
? ?HD#4 ?SUBJECTIVE:  ?Patient Summary: Isabel Barnes is a 81 y.o. with a pertinent PMH of previous cerebellar stroke, venous insufficiency, iron deficiency anemia, GERD, HTN, hypothyroidism, aortic stenosis, and ankylosing spondylitis, who presented with a fall and admitted for Afib likely secondary to acute cellulitis of the RLE.  ? ?Overnight Events: No acute events overnight ? ?Interim History: This is hospital day 4 for this patient. She states that she is in so much pain all throughout her body. Her left hip pain makes it hard to move and then her right leg pain is still severe.  ? ?OBJECTIVE:  ?Vital Signs: ?Vitals:  ? 02/09/22 1600 02/09/22 2018 02/09/22 2359 02/10/22 0413  ?BP: 128/68 (!) 155/71 137/78 (!) 140/58  ?Pulse:  71 72 66  ?Resp: '17 17 13 17  ' ?Temp: 97.6 ?F (36.4 ?C) 97.7 ?F (36.5 ?C) 98 ?F (36.7 ?C) 97.8 ?F (36.6 ?C)  ?TempSrc: Oral Oral Oral Oral  ?SpO2: 95% 96% 98% 99%  ?Weight:      ?Height:      ? ?Supplemental O2: Room Air ?SpO2: 99 % ? ?Filed Weights  ? 02/06/22 1205  ?Weight: 49.4 kg  ? ? ? ?Intake/Output Summary (Last 24 hours) at 02/10/2022 0603 ?Last data filed at 02/10/2022 0414 ?Gross per 24 hour  ?Intake 240 ml  ?Output 600 ml  ?Net -360 ml  ? ?Net IO Since Admission: 632.56 mL [02/10/22 0603] ? ?Physical Exam: ?General: Pleasant, thin and chronically ill appearing female laying in bed. No acute distress. ?CV: RRR. IV/VI holosystolic murmur appreciated.  ?Pulmonary: Lungs CTAB. Normal effort. No wheezing or rales. ?Abdominal: Soft, nontender, nondistended. Normal bowel sounds. ?Extremities: Palpable radial and DP pulses. ?Skin: Venous stasis dermatitis. RLE soft tissue enlargement with drainage; wrapped in gauze.  ?Neuro: A&Ox3. No focal deficit. ?Psych: Normal mood and affect ? ? ? ? ?ASSESSMENT/PLAN:  ?Assessment: ?Principal Problem: ?  Cellulitis of right lower extremity ?Active Problems: ?  Aortic valve stenosis ?  Anemia ?  Fluid collection (edema) in the arms, legs, hands  and feet ?  Fall ?  Atrial fibrillation ([place]) ?  Goals of care, counseling/discussion ?  Severe protein-calorie malnutrition ([place]) ?  PVD (peripheral vascular disease) ([place]) ? ? ?Plan: ?#Cellulitis of right lower extremity ?RLE wound continues to express tan drainage and has surrounding erythema, although it remains wrapped in gauze. Plan for debridement of the right calf ulcer today with ortho.  ?- Ulcer debridement with ortho on 3/15 ?- Continue Vanc and Rocephin ?- Tylenol 1000 mg tid ?- Tramadol 50 mg q6h prn for severe pain (oxycodone allergy) ?- Robaxin 500 mg q8h prn for muscle spasms  ?- Wound care ?- ABIs still pending  ? ?#GI bleed, stable ?#Iron deficiency anemia ?Patient intially presented with melanotic stools and gum bleeding, with concern for a bleeding disorder, such as Von Willebrand Disease. VW panel is negative, ruling out this diagnosis. Overall, Hb has remained stable at 9.7 and patient has had no further melanotic stools, but she also still has not had a bowel movement.  ?- Increase Protonix to 40 mg bid x 4 weeks, followed by 40 mg daily  ?- Daily CBC ?- Senna bid ?- Miralax prn  ? ?#New onset Afib ?Converted back to NSR. Continue amiodarone 200 mg bid. ? ?#Goals of care ?Patient met with the palliative team and noted that her primary concern is her decline in function and loss of independence. Family meeting was held and the plan will be to discharge patient  to SNF with outpatient palliative care follow up. TOC has been consulted to assist with SNF placememnt.  ? ? ?Best Practice: ?Diet: NPO, resume diet after procedure ?IVF: Fluids: none ?VTE: Held, resume after procedure  ?Code: DNR ?AB: Vanc, Rocephin  ?Therapy Recs: SNF ?Family Contact: Son, [name], to be notified. ?DISPO: Anticipated discharge in 1-3 days to Skilled nursing facility pending Medical stability and placement . ? ?Signature: ?Buddy Duty, D.O.  ?Internal Medicine Resident, PGY-1 ?[facility]   ?Pager: [phone] ?6:03 AM, 02/10/2022  ? ?Please contact the on call pager after 5 pm and on weekends at [phone]. ? ?

## 2022-02-10 NOTE — TOC Progression Note (Signed)
Transition of Care (TOC) - Progression Note  ? ? ?Patient Details  ?Name: WAUNITA SANDSTROM ?MRN: [id number] ?Date of Birth: 1941-08-15 ? ?Transition of Care (TOC) CM/SW Contact  ?Lawerance Sabal, RN ?Phone Number: ?02/10/2022, 11:52 AM ? ?Clinical Narrative:    ?Spoke w patient at bedside to discuss consult for palliative services after discharge. Patient will DC to SNF when medically stable. She is agreeable to having palliative follow her and would like Health visitor. ?Referral made to [place] with [facility] ? ? ? ?Expected Discharge Plan: Skilled Nursing Facility ?Barriers to Discharge: English as a second language teacher, Continued Medical Work up, SNF Pending bed offer ? ?Expected Discharge Plan and Services ?Expected Discharge Plan: Skilled Nursing Facility ?In-house Referral: Clinical Social Work ?  ?  ?Living arrangements for the past 2 months: Single Family Home ?                ?  ?  ?  ?  ?  ?  ?  ?  ?  ?  ? ? ?Social Determinants of Health (SDOH) Interventions ?  ? ?Readmission Risk Interventions ?No flowsheet data found. ? ?

## 2022-02-10 NOTE — Anesthesia Procedure Notes (Signed)
Procedure Name: LMA Insertion ?Date/Time: 02/10/2022 3:11 PM ?Performed by: Pearson Grippe, CRNA ?Pre-anesthesia Checklist: Patient identified, Emergency Drugs available, Suction available and Patient being monitored ?Patient Re-evaluated:Patient Re-evaluated prior to induction ?Oxygen Delivery Method: Circle system utilized ?Preoxygenation: Pre-oxygenation with 100% oxygen ?Induction Type: IV induction ?Ventilation: Mask ventilation without difficulty ?LMA: LMA inserted ?LMA Size: 3.0 ?Number of attempts: 1 ?Airway Equipment and Method: Bite block ?Placement Confirmation: positive ETCO2 ?Tube secured with: Tape ?Dental Injury: Teeth and Oropharynx as per pre-operative assessment  ? ? ? ? ?

## 2022-02-10 NOTE — Progress Notes (Signed)
Patient returned to 4E from PACU. Vitals taken and stable. Tele placed and CCMD notified. Wound vac in place and set for continuous suction at 125. Patient alert and oriented to unit. Call bell within reach.  ?Isabel Barnes  ?

## 2022-02-11 ENCOUNTER — Inpatient Hospital Stay (HOSPITAL_COMMUNITY): Payer: Medicare Other

## 2022-02-11 ENCOUNTER — Encounter (HOSPITAL_COMMUNITY): Payer: Self-pay | Admitting: Orthopedic Surgery

## 2022-02-11 ENCOUNTER — Ambulatory Visit: Payer: Medicare Other | Admitting: Physician Assistant

## 2022-02-11 DIAGNOSIS — I4891 Unspecified atrial fibrillation: Secondary | ICD-10-CM

## 2022-02-11 DIAGNOSIS — L03115 Cellulitis of right lower limb: Secondary | ICD-10-CM

## 2022-02-11 LAB — BASIC METABOLIC PANEL
Anion gap: 11 (ref 5–15)
BUN: 16 mg/dL (ref 8–23)
CO2: 23 mmol/L (ref 22–32)
Calcium: 7.9 mg/dL — ABNORMAL LOW (ref 8.9–10.3)
Chloride: 102 mmol/L (ref 98–111)
Creatinine, Ser: 0.54 mg/dL (ref 0.44–1.00)
GFR, Estimated: >60 mL/min (ref >60)
Glucose, Bld: 164 mg/dL — ABNORMAL HIGH (ref 70–99)
Potassium: 5.3 mmol/L — ABNORMAL HIGH (ref 3.5–5.1)
Sodium: 136 mmol/L (ref 135–145)

## 2022-02-11 LAB — CBC
HCT: 32 % — ABNORMAL LOW (ref 36.0–46.0)
Hemoglobin: 10.1 g/dL — ABNORMAL LOW (ref 12.0–15.0)
MCH: 26.5 pg (ref 26.0–34.0)
MCHC: 31.6 g/dL (ref 30.0–36.0)
MCV: 84 fL (ref 80.0–100.0)
Platelets: 457 10*3/uL — ABNORMAL HIGH (ref 150–400)
RBC: 3.81 MIL/uL — ABNORMAL LOW (ref 3.87–5.11)
RDW: 15.8 % — ABNORMAL HIGH (ref 11.5–15.5)
WBC: 9.6 10*3/uL (ref 4.0–10.5)
nRBC: 0 % (ref 0.0–0.2)

## 2022-02-11 LAB — CULTURE, BLOOD (ROUTINE X 2)
Culture: NO GROWTH
Culture: NO GROWTH
Special Requests: ADEQUATE

## 2022-02-11 MED ORDER — ENOXAPARIN SODIUM 30 MG/0.3ML IJ SOSY
30.0000 mg | PREFILLED_SYRINGE | Freq: Every day | INTRAMUSCULAR | Status: DC
  Administered 2022-02-11 – 2022-02-16 (×6): 30 mg via SUBCUTANEOUS
  Filled 2022-02-11 (×6): qty 0.3

## 2022-02-11 MED ORDER — TRAMADOL HCL 50 MG PO TABS
50.0000 mg | ORAL_TABLET | Freq: Four times a day (QID) | ORAL | Status: DC | PRN
  Administered 2022-02-12: 50 mg via ORAL
  Filled 2022-02-11: qty 1

## 2022-02-11 MED ORDER — TRAMADOL HCL 50 MG PO TABS
100.0000 mg | ORAL_TABLET | Freq: Four times a day (QID) | ORAL | Status: DC | PRN
  Administered 2022-02-11 – 2022-02-14 (×8): 100 mg via ORAL
  Filled 2022-02-11 (×8): qty 2

## 2022-02-11 MED ORDER — MORPHINE SULFATE (PF) 4 MG/ML IV SOLN
4.0000 mg | Freq: Once | INTRAVENOUS | Status: AC
  Administered 2022-02-11: 4 mg via INTRAVENOUS
  Filled 2022-02-11: qty 1

## 2022-02-11 NOTE — TOC Progression Note (Addendum)
Transition of Care (TOC) - Progression Note  ? ? ?Patient Details  ?Name: Isabel Barnes ?MRN: [id number] ?Date of Birth: 1941-01-22 ? ?Transition of Care (TOC) CM/SW Contact  ?Eduard Roux, LCSW ?Phone Number: ?02/11/2022, 5:12 PM ? ?Clinical Narrative:    ? ?Gave patient bed offers for short term rehab at [facility]. ? ?Expected Discharge Plan: Skilled Nursing Facility ?Barriers to Discharge: English as a second language teacher, Continued Medical Work up, SNF Pending bed offer ? ?Expected Discharge Plan and Services ?Expected Discharge Plan: Skilled Nursing Facility ?In-house Referral: Clinical Social Work ?  ?  ?Living arrangements for the past 2 months: Single Family Home ?                ?  ?  ?  ?  ?  ?  ?  ?  ?  ?  ? ? ?Social Determinants of Health (SDOH) Interventions ?  ? ?Readmission Risk Interventions ?No flowsheet data found. ? ?

## 2022-02-11 NOTE — Progress Notes (Signed)
ABI has been completed. ? ?Results can be found under chart review under CV PROC. ?02/11/2022 3:44 PM ?Avonda Toso RVT, RDMS ? ?

## 2022-02-11 NOTE — Progress Notes (Signed)
[name], female   DOB: 1941/05/19, 81 y.o.   MRN: [id number] ?Patient had a large area of necrotic skin and soft tissue right calf.  Patient currently has an installation wound VAC in place.  Anticipate returning to the operating room next week for biologic tissue graft to the leg.  Patient still needs ankle-brachial indices.  Tissue cultures are pending.   ? ?The Coban can be removed for ABI study. ?

## 2022-02-11 NOTE — Progress Notes (Signed)
? ?HD#5 ?SUBJECTIVE:  ?Patient Summary: Isabel Barnes is a 81 y.o. with a pertinent PMH of previous cerebellar stroke, venous insufficiency, iron deficiency anemia, GERD, HTN, hypothyroidism, aortic stenosis, and ankylosing spondylitis, who presented with a fall and admitted for Afib likely secondary to acute cellulitis of the RLE ? ?Overnight Events: No acute events overnight ? ?Interim History: This is hospital day 5 for this patient who was seen and evaluated with her friend at the bedside. She states that her procedure went well yesterday but that she is still having a lot of pain in her right leg. Appetite remains stable. Patient has not had a bowel movement yet.  ? ?OBJECTIVE:  ?Vital Signs: ?Vitals:  ? 02/11/22 0500 02/11/22 0900 02/11/22 1014 02/11/22 1111  ?BP:  (!) 134/57 114/71 (!) 120/48  ?Pulse:  63 66   ?Resp:  '20 18 18  ' ?Temp:  97.9 ?F (36.6 ?C)  (!) 97.4 ?F (36.3 ?C)  ?TempSrc:  Oral  Oral  ?SpO2:  100% 97% 100%  ?Weight: 47 kg     ?Height:      ? ?Supplemental O2: Nasal Cannula ?SpO2: 100 % ?O2 Flow Rate (L/min): 2 L/min ? ?Filed Weights  ? 02/06/22 1205 02/10/22 1336 02/11/22 0500  ?Weight: 49.4 kg 49.9 kg 47 kg  ? ? ? ?Intake/Output Summary (Last 24 hours) at 02/11/2022 1137 ?Last data filed at 02/11/2022 1000 ?Gross per 24 hour  ?Intake 1770 ml  ?Output 50 ml  ?Net 1720 ml  ? ?Net IO Since Admission: 2,352.56 mL [02/11/22 1137] ? ?Physical Exam: ?General: Pleasant, thin-appearing female laying in bed. No acute distress. ?CV: RRR. IV/VI holosystolic murmur.  ?Pulmonary: Lungs CTAB. Normal effort. No wheezing or rales. ?Abdominal: Soft, nontender, nondistended. Normal bowel sounds. ?Extremities: Palpable radial and DP pulses.  ?Skin: Extensive venous stasis dermatitis. RLE wound is wrapped in ace bandage with wound VAC in place (~125 cc serosanguinous output).  ?Neuro: A&Ox3. No focal deficit. ?Psych: Normal mood and affect ? ? ?ASSESSMENT/PLAN:  ?Assessment: ?Principal Problem: ?  Cellulitis of  right lower extremity ?Active Problems: ?  Aortic valve stenosis ?  Anemia ?  Fluid collection (edema) in the arms, legs, hands and feet ?  Fall ?  Atrial fibrillation ([place]) ?  Goals of care, counseling/discussion ?  Severe protein-calorie malnutrition ([place]) ?  PVD (peripheral vascular disease) ([place]) ?  Abscess of right lower leg ? ? ?Plan: ?#Cellulitis of right lower extremity ?Patient underwent excisional debridement of right lower leg with ortho yesterday and was found to have massive necrotic soft tissue that was sent for cultures. Wound VAC was placed and had ~125 cc of serosanguinous output on evaluation this morning. Plans for the patient to return to the OR next week for a biologic tissue graft to the leg. Patient is still in a significant amount of pain, so will adjust her pain meds as below.  ?- Cultures pending ?- Continue IV vanc and rocephin  ?- Tylenol 1000 mg tid  ?- Tramadol 50-100 mg q6h prn for moderate-severe pain ?- Robaxin 500 mg q8h prn for muscle spasms  ?- Wound care ?- ABIs still pending ? ?#GI bleed, stable ?#Iron deficiency anemia ?#Constipation  ?Hb remains stable at 10.1 today, with no signs of bleeding. Patient did have one bowel movement yesterday with no melena or hematochezia noted.  ?- Protonix 40 mg bid x 4 weeks total, followed by 40 mg daily ?- Daily CBC ?- Senna bid ?- Miralax mprn ? ?#New onset Afib ?Converted  back to NSR. Continue amiodarone 200 mg bid. ? ?#Goals of care ?Patient met with the palliative team and noted that her primary concern is her decline in function and loss of independence. Family meeting was held and the plan will be to discharge patient to SNF with outpatient palliative care follow up. TOC has been consulted to assist with SNF placememnt.  ? ?Best Practice: ?Diet: Regular diet ?IVF: Fluids: none ?VTE: enoxaparin (LOVENOX) injection 40 mg Start: 02/11/22 1230 ?Code: DNR ?AB: Vanc, rocephin ?Therapy Recs: SNF ?Family Contact: Friend, updated at the  bedside ?DISPO: Anticipated discharge to Skilled nursing facility pending Medical stability. ? ?Signature: ?Kathy Wahid, D.O.  ?Internal Medicine Resident, PGY-1 ?[facility]  ?Pager: [phone] ?11:37 AM, 02/11/2022  ? ?Please contact the on call pager after 5 pm and on weekends at [phone]. ? ?

## 2022-02-11 NOTE — Progress Notes (Signed)
Patient's foot was bothering her and she asked to have the coband cut off her foot. RN left coband wrapped around leg and just cut off the wrap around patients ankle.  ?Isabel Barnes  ?

## 2022-02-11 NOTE — Progress Notes (Signed)
Pharmacy Antibiotic Note ? ?Isabel Barnes is a 81 y.o. female admitted on 02/06/2022 with cellulitis.  Initially started on ceftriaxone, now with concern for worsening cellulitis. Per WOC, purulent drainage observed. Pharmacy has been consulted for Vancomycin dosing. ? ?Patient s/p debridement of leg abscess 3/15, wound vac in place. Patient currently afebrile, wbc normal at 9. Renal function normal and stable. No growth in cultures.  ? ?Plan: ?Ceftriaxone 2g IV Q24h ?Vancomycin 1250mg  IV Q48h (goal AUC 400-550, eAUC 479, Scr rounded to 0.8) ? ?Height: 5' (152.4 cm) ?Weight: 47 kg (103 lb 11.2 oz) ?IBW/kg (Calculated) : 45.5 ? ?Temp (24hrs), Avg:97.7 ?F (36.5 ?C), Min:97.2 ?F (36.2 ?C), Max:98.6 ?F (37 ?C) ? ?Recent Labs  ?Lab 02/06/22 ?1239 02/06/22 ?1540 02/06/22 ?1938 02/07/22 ?0316 02/08/22 ?0240 02/09/22 ?0133 02/10/22 ?0234 02/11/22 ?0146  ?WBC 16.3*  --   --  15.0* 12.2* 11.4* 10.9* 9.6  ?CREATININE 0.68  --   --  0.61  --  0.70 0.67 0.54  ?LATICACIDVEN  --  1.5 1.9  --   --   --   --   --   ? ?  ?Estimated Creatinine Clearance: 38.9 mL/min (by C-G formula based on SCr of 0.54 mg/dL).   ? ?Allergies  ?Allergen Reactions  ? Dilaudid [Hydromorphone Hcl] Shortness Of Breath  ? Gabapentin Other (See Comments)  ?  Hoarseness , headache and sore throat  ? Latex Rash  ?  Severe rash  ? Lyrica [Pregabalin] Other (See Comments)  ?  No balance , had to walk with cane , Blurred vision,weakness.  ? Oxycodone Shortness Of Breath and Other (See Comments)  ?  Cannot breathe.  ? Singulair [Montelukast] Shortness Of Breath and Other (See Comments)  ?  Vision issues, also  ? Ciprofloxacin Hcl Nausea And Vomiting and Other (See Comments)  ?  Nausea and vomiting with by mouth form  ? Codeine Other (See Comments)  ?  Hallucinations  ? Methadone Nausea And Vomiting and Other (See Comments)  ?  Severe nausea and vomiting  ? Metronidazole Nausea And Vomiting and Other (See Comments)  ?  Gastric pain  ? Oysters [Shellfish Allergy]  Other (See Comments)  ?  "Terrible gastric upset and cramping."  ? Clindamycin/Lincomycin Diarrhea and Nausea Only  ? Donepezil Diarrhea and Other (See Comments)  ?  Severe diarrhea  ? Sulfa Antibiotics Diarrhea and Other (See Comments)  ?  GI issues ?  ? Tape Other (See Comments)  ?  Severe rash  ? Zetia [Ezetimibe] Diarrhea  ? Elemental Sulfur Nausea And Vomiting  ? Iodine Rash  ? Other Rash  ?  All Antibiotic ointments/ creams  ? Oyster Shell Rash  ? Penicillin G Nausea And Vomiting and Rash  ? Penicillins Nausea And Vomiting and Rash  ?  Has patient had a PCN reaction causing immediate rash, facial/tongue/throat swelling, SOB or lightheadedness with hypotension: Yes ?Has patient had a PCN reaction causing severe rash involving mucus membranes or skin necrosis: No ?Has patient had a PCN reaction that required hospitalization No ?Has patient had a PCN reaction occurring within the last 10 years: No ?If all of the above answers are "NO", then may proceed with Cephalosporin use. ?  ? Povidone Iodine Rash and Other (See Comments)  ?  Oyster shell products- Rash ?  ? Skintegrity Hydrogel [Skin Protectants, Misc.] Rash  ? Tapentadol Other (See Comments)  ?  Nightmares  ? ? ?Antimicrobials this admission: ?Ceftriaxone 3/11 >> ?Vanc 3/13 >> ? ? ?  Microbiology results: ?3/11 Bcx: NG ?3/13 tissue: ngtd ? ? ?Thank you for allowing pharmacy to be a part of this patient?s care. ? ?Erin Hearing PharmD., BCPS ?Clinical Pharmacist ?02/11/2022 10:22 AM ? ?

## 2022-02-11 NOTE — Progress Notes (Signed)
Palliative: ? ?Brief follow-up at request of previous palliative provider.  Plan was to further discuss goals of care and MOST form with family at bedside; however, patient tells me son is not in town yet and will be later this week.  She also tells me that her hospital stay has been extended as there is now plan for skin graft next week so we can plan to have further goals of care conversations once son arrives.  Patient tells me pain is well controlled with morphine.  No symptom management needs at this time. Will ask palliative provider that knows patient to follow-up later in week. ? ?Gerlean Ren, DNP, AGNP-C ?Palliative Medicine Team ?Team Phone # [phone]  ?Pager # [phone] ? ? ?No charge ?

## 2022-02-12 DIAGNOSIS — L03115 Cellulitis of right lower limb: Secondary | ICD-10-CM | POA: Diagnosis not present

## 2022-02-12 DIAGNOSIS — Z7189 Other specified counseling: Secondary | ICD-10-CM | POA: Diagnosis not present

## 2022-02-12 DIAGNOSIS — I480 Paroxysmal atrial fibrillation: Secondary | ICD-10-CM | POA: Diagnosis not present

## 2022-02-12 DIAGNOSIS — L02415 Cutaneous abscess of right lower limb: Secondary | ICD-10-CM | POA: Diagnosis not present

## 2022-02-12 DIAGNOSIS — E43 Unspecified severe protein-calorie malnutrition: Secondary | ICD-10-CM | POA: Diagnosis not present

## 2022-02-12 LAB — BASIC METABOLIC PANEL
Anion gap: 6 (ref 5–15)
BUN: 23 mg/dL (ref 8–23)
CO2: 25 mmol/L (ref 22–32)
Calcium: 7.8 mg/dL — ABNORMAL LOW (ref 8.9–10.3)
Chloride: 102 mmol/L (ref 98–111)
Creatinine, Ser: 0.7 mg/dL (ref 0.44–1.00)
GFR, Estimated: >60 mL/min (ref >60)
Glucose, Bld: 120 mg/dL — ABNORMAL HIGH (ref 70–99)
Potassium: 4.7 mmol/L (ref 3.5–5.1)
Sodium: 133 mmol/L — ABNORMAL LOW (ref 135–145)

## 2022-02-12 LAB — CBC
HCT: 27.9 % — ABNORMAL LOW (ref 36.0–46.0)
Hemoglobin: 8.9 g/dL — ABNORMAL LOW (ref 12.0–15.0)
MCH: 26.2 pg (ref 26.0–34.0)
MCHC: 31.9 g/dL (ref 30.0–36.0)
MCV: 82.1 fL (ref 80.0–100.0)
Platelets: 441 10*3/uL — ABNORMAL HIGH (ref 150–400)
RBC: 3.4 MIL/uL — ABNORMAL LOW (ref 3.87–5.11)
RDW: 15.7 % — ABNORMAL HIGH (ref 11.5–15.5)
WBC: 15.7 10*3/uL — ABNORMAL HIGH (ref 4.0–10.5)
nRBC: 0 % (ref 0.0–0.2)

## 2022-02-12 LAB — MAGNESIUM: Magnesium: 2 mg/dL (ref 1.7–2.4)

## 2022-02-12 MED ORDER — MORPHINE SULFATE (PF) 2 MG/ML IV SOLN
2.0000 mg | Freq: Four times a day (QID) | INTRAVENOUS | Status: DC | PRN
  Administered 2022-02-12 – 2022-02-16 (×10): 2 mg via INTRAVENOUS
  Filled 2022-02-12 (×10): qty 1

## 2022-02-12 MED ORDER — ENSURE ENLIVE PO LIQD
237.0000 mL | Freq: Three times a day (TID) | ORAL | Status: DC
  Administered 2022-02-13 – 2022-02-15 (×5): 237 mL via ORAL

## 2022-02-12 MED ORDER — ZINC SULFATE 220 (50 ZN) MG PO CAPS
220.0000 mg | ORAL_CAPSULE | Freq: Every day | ORAL | Status: DC
  Administered 2022-02-12 – 2022-02-22 (×11): 220 mg via ORAL
  Filled 2022-02-12 (×11): qty 1

## 2022-02-12 MED ORDER — ADULT MULTIVITAMIN W/MINERALS CH
1.0000 | ORAL_TABLET | Freq: Every day | ORAL | Status: DC
  Administered 2022-02-12 – 2022-02-22 (×11): 1 via ORAL
  Filled 2022-02-12 (×11): qty 1

## 2022-02-12 MED ORDER — ASCORBIC ACID 500 MG PO TABS
500.0000 mg | ORAL_TABLET | Freq: Every day | ORAL | Status: DC
  Administered 2022-02-12 – 2022-02-22 (×11): 500 mg via ORAL
  Filled 2022-02-12 (×11): qty 1

## 2022-02-12 NOTE — Progress Notes (Signed)
Mobility Specialist Progress Note ? ? 02/12/22 1705  ?Mobility  ?Activity Transferred to/from [facility]  ?Level of Assistance Contact guard assist, steadying assist  ?Assistive Device Front wheel walker  ?Distance Ambulated (ft) 4 ft  ?Activity Response Tolerated well  ?$Mobility charge 1 Mobility  ? ?Pt received in chair needing assistance to [facility]. No physical assist needed for incline or decline to the chair & BSC. CG for safety and positioning, pt did not need assistance for pericare. Successful void and BM. Returned back to chair to sit up for dinner. Call bell left by side.  ? ?Isabel Barnes ?Mobility Specialist ?Phone Number [phone] ? ?

## 2022-02-12 NOTE — Progress Notes (Signed)
Hematology brief note  ? ?I reviewed her lab results, repeated PTT was normal, mixing study was not performed (not needed).  No other new concerns from hematology standpoint.  I will sign off for now, please call us if needed. ? ?Malachy Mood  ?02/12/2022  ?

## 2022-02-12 NOTE — Progress Notes (Addendum)
Physical Therapy Treatment ?Patient Details ?Name: Isabel Barnes ?MRN: [id number] ?DOB: 03-Jun-1941 ?Today's Date: 02/12/2022 ? ? ?History of Present Illness Pt is a 81 y.o. F who presents 02/06/2022 after a fall. CT head negative for acute abnormality. Hip x-ray negative for acute fracture. Found to have new a-fib in setting of moderate to severe AS. Significant PMH: cerebellar stroke, anemia, HTN, aortic stenosis, ankylosing spondylitis, venous insufficiency. ? ?  ?PT Comments  ? ? Pt received in supine, agreeable to therapy session with good participation in transfer and short gait task at bedside. Pt limited due to R calf and L hip/low back pain but able to perform transfers and bed mobility with increased time and minA. Pt with notable short term memory deficit, asking if she could have pain meds ~10 mins and then ~20 mins after RN administered them. Pt continues to benefit from PT services to progress toward functional mobility goals.    ?Recommendations for follow up therapy are one component of a multi-disciplinary discharge planning process, led by the attending physician.  Recommendations may be updated based on patient status, additional functional criteria and insurance authorization. ? ?Follow Up Recommendations ? Skilled nursing-short term rehab (<3 hours/day) ?  ?  ?Assistance Recommended at Discharge Frequent or constant Supervision/Assistance  ?Patient can return home with the following A little help with bathing/dressing/bathroom;Assistance with cooking/housework;Assist for transportation;Help with stairs or ramp for entrance;A lot of help with walking and/or transfers ?  ?Equipment Recommendations ? BSC/3in1 (pt unable to ambulate back to bathroom at this time due to pain)  ?  ?Recommendations for Other Services   ? ? ?  ?Precautions / Restrictions Precautions ?Precautions: Fall ?Precaution Comments: premedicate, chronic LBP and R calf wound extremely tender; would vac RLE ?Restrictions ?Weight  Bearing Restrictions: Yes ?RLE Weight Bearing: Weight bearing as tolerated  ?  ? ?Mobility ? Bed Mobility ?Overal bed mobility: Needs Assistance ?Bed Mobility: Rolling, Sidelying to Sit ?Rolling: Supervision ?Sidelying to sit: Min assist, HOB elevated ?  ?  ?  ?General bed mobility comments: MinA with use of bed pad to come into sitting EOB increased pain at R hip requiring significant extra time ?  ? ?Transfers ?Overall transfer level: Needs assistance ?Equipment used: Rolling walker (2 wheels), 1 person hand held assist ?Transfers: Bed to chair/wheelchair/BSC ?Sit to Stand: Min assist ?  ?  ?  ?  ?  ?General transfer comment: minA to rise and steady at RW ?  ? ?Ambulation/Gait ?Ambulation/Gait assistance: Min assist ?Gait Distance (Feet): 6 Feet ?Assistive device: Rolling walker (2 wheels) ?Gait Pattern/deviations: Step-to pattern, Trunk flexed ?  ?  ?  ?General Gait Details: Mod cues for step sequencing using RW and manual assist to manage RW safely; step-to pattern leading with RLE due to RLE pain ? ? ?Stairs ?  ?  ?  ?  ?  ? ? ?Wheelchair Mobility ?  ? ?Modified Rankin (Stroke Patients Only) ?  ? ? ?  ?Balance Overall balance assessment: Needs assistance ?Sitting-balance support: Feet supported, Single extremity supported ?Sitting balance-Leahy Scale: Fair ?Sitting balance - Comments: pt guarding due to pain in L hip ?  ?Standing balance support: Bilateral upper extremity supported ?Standing balance-Leahy Scale: Poor ?Standing balance comment: RW and external support due to L hip and R calf pain ?  ?  ?  ?  ?  ?  ?  ?  ?  ?  ?  ?  ? ?  ?Cognition Arousal/Alertness: Awake/alert ?Behavior During Therapy: [facility] for  tasks assessed/performed ?Overall Cognitive Status: Within Functional Limits for tasks assessed ?Area of Impairment: Memory, Safety/judgement ?  ?  ?  ?  ?  ?  ?  ?  ?  ?  ?Memory: Decreased short-term memory ?  ?Safety/Judgement: Decreased awareness of safety ?  ?  ?General Comments: Notable short term  memory deficits today - pt states "Did I have pain medicine yet?" ten minutes after RN gave pain meds (RN giving pain meds right before PTA came into room). ?  ?  ? ?  ?Exercises Other Exercises ?Other Exercises: supine BLE AROM: ankle pumps, heel slides x5-10 reps ea ?Other Exercises: seated BLE AROM: LAQ, hip flexion x5-10 reps ea ? ?  ?General Comments General comments (skin integrity, edema, etc.): VSS on RA ?  ?  ? ?Pertinent Vitals/Pain Pain Assessment ?Pain Assessment: Faces ?Faces Pain Scale: Hurts even more ?Pain Location: R calf/distal to R knee, bilateral hips (L>R) ?Pain Descriptors / Indicators: Shooting, Sharp, Burning, Grimacing ?Pain Intervention(s): Monitored during session, Premedicated before session, Repositioned, Ice applied  ? ? ?Home Living   ?  ?  ?  ?  ?  ?  ?  ?  ?  ?   ?  ?Prior Function    ?  ?  ?   ? ?PT Goals (current goals can now be found in the care plan section) Acute Rehab PT Goals ?Patient Stated Goal: less pain ?PT Goal Formulation: With patient ?Time For Goal Achievement: 02/21/22 ?Progress towards PT goals: Progressing toward goals ? ?  ?Frequency ? ? ? Min 3X/week ? ? ? ?  ?PT Plan Current plan remains appropriate  ? ? ?   ?AM-PAC PT "6 Clicks" Mobility   ?Outcome Measure ? Help needed turning from your back to your side while in a flat bed without using bedrails?: A Little ?Help needed moving from lying on your back to sitting on the side of a flat bed without using bedrails?: A Little ?Help needed moving to and from a bed to a chair (including a wheelchair)?: A Lot (mod cues for this and item below) ?Help needed standing up from a chair using your arms (e.g., wheelchair or bedside chair)?: A Lot ?Help needed to walk in hospital room?: Total ?Help needed climbing 3-5 steps with a railing? : Total ?6 Click Score: 12 ? ?  ?End of Session Equipment Utilized During Treatment: Gait belt ?Activity Tolerance: Patient tolerated treatment well;Patient limited by pain ?Patient left: in  chair;with call bell/phone within reach;with chair alarm set ?Nurse Communication: Mobility status;Other (comment) (mobility tech notified to check on her in ~1hr to assist back to bed) ?PT Visit Diagnosis: Unsteadiness on feet (R26.81);Muscle weakness (generalized) (M62.81);History of falling (Z91.81) ?  ? ? ?Time: [id number] ?PT Time Calculation (min) (ACUTE ONLY): 21 min ? ?Charges:  $Therapeutic Activity: 8-22 mins          ?          ? ?Jilliana Burkes P., PTA ?Acute Rehabilitation Services ?Pager: [phone] ?Office: [phone]  ? ? ?Marilyne Haseley M Merilyn Pagan ?02/12/2022, 5:58 PM ? ?

## 2022-02-12 NOTE — Care Management Important Message (Signed)
Important Message ? ?Patient Details  ?Name: Isabel Barnes ?MRN: [id number] ?Date of Birth: August 10, 1941 ? ? ?Medicare Important Message Given:  Yes ? ? ? ? ?Renie Ora ?02/12/2022, 9:18 AM ?

## 2022-02-12 NOTE — Progress Notes (Signed)
? ?HD#6 ?SUBJECTIVE:  ?Patient Summary: Isabel Barnes is a 81 y.o. with a pertinent PMH of previous cerebellar stroke, venous insufficiency, iron deficiency anemia, GERD, HTN, hypothyroidism, aortic stenosis, and ankylosing spondylitis, who presented with a fall and admitted for Afib likely secondary to acute cellulitis of the RLE ? ?Overnight Events: No acute events overnight ? ?Interim History: This is hospital day 6 for this patient. She states that tramadol helps but her pain is still pretty bad, especially when trying to move around. Her appetite remains stable at this time.  ? ?OBJECTIVE:  ?Vital Signs: ?Vitals:  ? 02/11/22 1111 02/11/22 1544 02/11/22 2004 02/12/22 0000  ?BP: (!) 120/48 (!) 111/48 126/74 135/71  ?Pulse:  (!) 58 68 61  ?Resp: '18 16 18 16  ' ?Temp: (!) 97.4 ?F (36.3 ?C) 97.9 ?F (36.6 ?C) 97.8 ?F (36.6 ?C) 97.9 ?F (36.6 ?C)  ?TempSrc: Oral  Oral Oral  ?SpO2: 100% 99% 95% 95%  ?Weight:      ?Height:      ? ?Supplemental O2: Room Air ?SpO2: 95 % ?O2 Flow Rate (L/min): 2 L/min ? ?Filed Weights  ? 02/06/22 1205 02/10/22 1336 02/11/22 0500  ?Weight: 49.4 kg 49.9 kg 47 kg  ? ? ? ?Intake/Output Summary (Last 24 hours) at 02/12/2022 5456 ?Last data filed at 02/11/2022 2230 ?Gross per 24 hour  ?Intake 840 ml  ?Output 400 ml  ?Net 440 ml  ? ?Net IO Since Admission: 2,552.56 mL [02/12/22 0652] ? ?Physical Exam: ?General: Pleasant, thin-appearing female laying in bed. No acute distress. ?CV: RRR. IV/VI holosystolic murmur.  ?Pulmonary: Lungs CTAB. Normal effort. No wheezing or rales. ?Abdominal: Soft, nontender, nondistended. Normal bowel sounds. ?Extremities: Normal bulk and tone.  ?Skin: Venous stasis dermatitis. RLE wound is wrapped in ace bandage with wound VAC in place. (~100 cc serosanguinous output) ?Neuro: A&Ox3. No focal deficit. ?Psych: Normal mood and affect ? ? ? ?ASSESSMENT/PLAN:  ?Assessment: ?Principal Problem: ?  Cellulitis of right lower extremity ?Active Problems: ?  Aortic valve  stenosis ?  Anemia ?  Fluid collection (edema) in the arms, legs, hands and feet ?  Fall ?  Atrial fibrillation ([place]) ?  Goals of care, counseling/discussion ?  Severe protein-calorie malnutrition ([place]) ?  PVD (peripheral vascular disease) ([place]) ?  Abscess of right lower leg ? ? ?Plan: ?#Cellulitis of right lower extremity ?#PAD right lower extremity  ?Patient underwent excisional debridement of right lower leg with ortho on 3/15 and was found to have necrotic soft tissue. Surgical cultures growing enterococcus faecalis, with susceptibilities to follow. Wound VAC was placed with ~100 serosanguinous output. Plans to return to OR with ortho next week for a biologic tissue graft to the leg.  ?- Continue IV vanc and rocephin  ?- Tylenol 1000 mg tid  ?- Tramadol 50-100 mg q6h prn for moderate-severe pain ?- IV morphine 2 mg q6h for breakthrough pain (after tramadol given) ?- Robaxin 500 mg q8h prn for muscle spasms  ? ?#PAD ?ABIs performed yesterday with noncompressible ABI on the right and left 1.35. Bilateral TBI are abnormal with readings of 0.37 on the right and 0.54 on the left (left is near normal). Patient has been told she has had poor blood flow on the right before.  ?- Will consult vascular surgery today ?- Continue crestor 5 mg daily  ?- Lipid profile pending ? ?#GI bleed ?#Iron deficiency anemia ?#Constipation  ?Patient had 1 BM yesterday with no melena or hematochezia noted Hb dropped from 10.1 to 8.9 today,  although patient feels fine. Denies dizziness/lightheadedness.   ?- Protonix 40 mg bid ?- Daily CBC ?- Senna bid ?- Miralax prn  ? ?#New onset Afib ?Patient remains in NSR. Continue amiodarone 200 mg bid. ?  ?#Goals of care ?Patient met with the palliative team and noted that her primary concern is her decline in function and loss of independence. Family meeting was held and the plan will be to discharge patient to SNF with outpatient palliative care follow up. TOC has been consulted to assist with SNF  placememnt.  ? ? ?Best Practice: ?Diet: Regular diet ?IVF: Fluids: none ?VTE: enoxaparin (LOVENOX) injection 30 mg Start: 02/11/22 1230 ?Code: DNR ?AB: Vanc, rocephin ?Therapy Recs: SNF ?Family Contact: son, [name], to be notified. ?DISPO: Anticipated discharge next week to Skilled nursing facility pending Medical stability. ? ?Signature: ?Osei Anger, D.O.  ?Internal Medicine Resident, PGY-1 ?[facility]  ?Pager: [phone] ?6:52 AM, 02/12/2022  ? ?Please contact the on call pager after 5 pm and on weekends at [phone]. ? ?

## 2022-02-12 NOTE — Consult Note (Signed)
?Hospital Consult ? ? ? ?Reason for Consult:  Abnormal ABI's. Hx of PAD ?Requesting Physician:  Dr. Kirke Corin ?MRN #:  [id number] ? ?History of Present Illness: This is a 81 y.o. female with pertinent past medical history of  CVA, HTN, Hypothyroidism, Aortic stenosis, Iron deficiency Anemia, venous insufficiency, and PAD who presented to hospital with necrotic abscess of her right medial calf with cellulitis. She underwent excisional debridement of the skin and soft tissue and fascia with application of wound VAC by Dr. Lajoyce Corners on 02/10/22. She was noted to have dampened monophasic DP signals on the right lower extremity. She had ABI's performed which showed non compressible vessels on the right with diminished TBI's bilaterally. Patient reports that she has known that she has bad veins and arteries for many years. She has had non invasive studies performed by her Cardiologist, Dr. Kirke Corin several years ago. She also reports former testing done at [facility] several years ago by her PCP. She explains that she has had several falls recently and has had this non healing wound of her right leg for a while now but has worsened over past week. She has had numerous ulcers in the past with her long history of venous insufficiency. She says she does not elevate as regularly as she should and she has lymphatic pumps at home,  but admits that she does not use these as frequently as she should. She denies any pain in her legs on ambulation or rest, no previous non healing wounds. She explains that she ambulates around her house pretty well and also her front yard but has really stopped walking outside much secondary to unsteadiness and balance issues with her legs and also a bad left hip. She has had no prior interventions on her legs. She is a former smoker.  ? ?Past Medical History:  ?Diagnosis Date  ? Anemia   ? iron deficiency hx.has had iron infusions before   ? Chronic low back pain   ? Chronic pain disorder   ? Complication of  anesthesia   ? severe claustrophobia  ? Constipation   ? r/t use of pain meds.Takes OTC meds or eats prunes  ? Depression   ? GERD (gastroesophageal reflux disease)   ? takes Omeprazole daily  ? Heart murmur   ? mild MS, moderate-severe AS 03/17/21 echo  ? History of bronchitis   ? 20+ yrs ago  ? History of kidney stones   ? 3 surgerical removed, 1 passed  ? History of prolapse of bladder   ? History of shingles   ? Hypertension   ? takes Losartan daily  ? Hypothyroidism   ? takes Synthroid daily  ? Joint swelling   ? Neck pain   ? bone spurs at base of head per pt  ? Osteoarthritis   ? lumbar,cervical,joints  ? Pneumonia   ? hx of > 20 yrs ago  ? Shortness of breath   ? occasionally and with exertion. Albuterol inhaler as needed  ? Spinal headache 1991  ? blood patch placed  ? 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 Unsteady gait   ? occasionally  ? Urinary urgency   ? ? ?Past Surgical History:  ?Procedure Laterality Date  ? ABDOMINAL HYSTERECTOMY    ? ANTERIOR FUSION CERVICAL SPINE    ? x2 -C4-7  ? BUNIONECTOMY Bilateral   ? COLONOSCOPY    ? CYSTOSCOPY W/ URETEROSCOPY  2012  ? EYE SURGERY Bilateral   ? cataract /lens implant  ? HOLMIUM LASER APPLICATION  Left 02/08/2013  ? Procedure: HOLMIUM LASER APPLICATION;  Surgeon: Anner CreteJohn J Wrenn, MD;  [facility];  Service: Urology;  Laterality: Left;  ? I & D EXTREMITY Right 02/10/2022  ? Procedure: DEBRIDEMENT RIGHT LEG ABSCESS;  Surgeon: Nadara Mustarduda, Marcus V, MD;  [facility];  Service: Orthopedics;  Laterality: Right;  ? INSERTION OF MESH N/A 07/15/2014  ? Procedure: INSERTION OF MESH;  Surgeon: Ardeth SportsmanSteven C. Gross, MD;  Location: [facility] OR;  Service: General;  Laterality: N/A;  ? JOINT REPLACEMENT Right 2012  ? shoulder  ? LAPAROSCOPIC CHOLECYSTECTOMY W/ CHOLANGIOGRAPHY  2012  ? Dr Magnus IvanBlackman  ? NASAL SINUS SURGERY    ? OPEN REDUCTION INTERNAL FIXATION (ORIF) DISTAL RADIAL FRACTURE Left 03/20/2021  ? Procedure: OPEN REDUCTION INTERNAL FIXATION (ORIF) DISTAL RADIAL FRACTURE;   Surgeon: Teryl LucyLandau, Joshua, MD;  Location: [facility] OR;  Service: Orthopedics;  Laterality: Left;  ? RADIOLOGY WITH ANESTHESIA N/A 05/09/2014  ? Procedure: ADULT SEDATION WITH ANESTHESIA/MRI CERVICAL SPINE WITHOUT CONTRAST;  Surgeon: Medication Radiologist, MD;  Location: [facility] OR;  Service: Radiology;  Laterality: N/A;  DR. HAWKS/MRI  ? right knee arthroscopy    ? d/t meniscal tear  ? SHOULDER ARTHROSCOPY W/ ROTATOR CUFF REPAIR Bilateral three times each over several yrs  ? THUMB ARTHROSCOPY Left   ? 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 Procedure: RIGHT TOTAL KNEE ARTHROPLASTY;  Surgeon: Jodi GeraldsJohn Graves, MD;  Location: [facility] OR;  Service: Orthopedics;  Laterality: Right;  ? UMBILICAL HERNIA REPAIR N/A 07/15/2014  ? Procedure: LAPAROSCOPIC UMBILICAL AND INFRAUMBILICAL HERNIA;  Surgeon: Ardeth SportsmanSteven C. Gross, MD;  Location: [facility] OR;  Service: General;  Laterality: N/A;  ? ? ?Allergies  ?Allergen Reactions  ? Dilaudid [Hydromorphone Hcl] Shortness Of Breath  ? Gabapentin Other (See Comments)  ?  Hoarseness , headache and sore throat  ? Latex Rash  ?  Severe rash  ? Lyrica [Pregabalin] Other (See Comments)  ?  No balance , had to walk with cane , Blurred vision,weakness.  ? Oxycodone Shortness Of Breath and Other (See Comments)  ?  Cannot breathe.  ? Singulair [Montelukast] Shortness Of Breath and Other (See Comments)  ?  Vision issues, also  ? Ciprofloxacin Hcl Nausea And Vomiting and Other (See Comments)  ?  Nausea and vomiting with by mouth form  ? Codeine Other (See Comments)  ?  Hallucinations  ? Methadone Nausea And Vomiting and Other (See Comments)  ?  Severe nausea and vomiting  ? Metronidazole Nausea And Vomiting and Other (See Comments)  ?  Gastric pain  ? Oysters [Shellfish Allergy] Other (See Comments)  ?  "Terrible gastric upset and cramping."  ? Clindamycin/Lincomycin Diarrhea and Nausea Only  ? Donepezil Diarrhea and Other (See Comments)  ?  Severe diarrhea  ? Sulfa Antibiotics Diarrhea and Other (See Comments)  ?  GI issues ?   ? Tape Other (See Comments)  ?  Severe rash  ? Zetia [Ezetimibe] Diarrhea  ? Elemental Sulfur Nausea And Vomiting  ? Iodine Rash  ? Other Rash  ?  All Antibiotic ointments/ creams  ? Oyster Shell Rash  ? Penicillin G Nausea And Vomiting and Rash  ? Penicillins Nausea And Vomiting and Rash  ?  Has patient had a PCN reaction causing immediate rash, facial/tongue/throat swelling, SOB or lightheadedness with hypotension: Yes ?Has patient had a PCN reaction causing severe rash involving mucus membranes or skin necrosis: No ?Has patient had a PCN reaction that required hospitalization No ?Has patient had a PCN  reaction occurring within the last 10 years: No ?If all of the above answers are "NO", then may proceed with Cephalosporin use. ?  ? Povidone Iodine Rash and Other (See Comments)  ?  Oyster shell products- Rash ?  ? Skintegrity Hydrogel [Skin Protectants, Misc.] Rash  ? Tapentadol Other (See Comments)  ?  Nightmares  ? ? ?Prior to Admission medications   ?Medication Sig Start Date End Date Taking? Authorizing Provider  ?albuterol (VENTOLIN HFA) 108 (90 Base) MCG/ACT inhaler Inhale 2 puffs into the lungs every 4 (four) hours as needed for shortness of breath.    Yes [provider]  ?aspirin EC 81 MG tablet Take 81 mg by mouth daily.   Yes [provider]  ?carboxymethylcellulose (REFRESH PLUS) 0.5 % SOLN Place 1 drop into both eyes in the morning, at noon, and at bedtime.   Yes [provider]  ?cholecalciferol (VITAMIN D) 1000 UNITS tablet Take 1,000 Units by mouth 2 (two) times daily.   Yes [provider]  ?diclofenac sodium (VOLTAREN) 1 % GEL Apply 2 g topically 2 (two) times daily as needed (for pain).   Yes [provider]  ?Digestive Enzymes (ENZYME DIGEST PO) Take 1 tablet by mouth daily as needed (For digestive).   Yes [provider]  ?diphenhydrAMINE (BENADRYL) 25 MG tablet Take 25 mg by mouth every 6 (six) hours as needed for allergies.   Yes  [provider]  ?ENSURE PLUS (ENSURE PLUS) LIQD Take 237 mLs by mouth daily as needed (for supplementation).   Yes [provider]  ?fish oil-omega-3 fatty acids 1000 MG capsule Take 1,000 mg by mo

## 2022-02-12 NOTE — Progress Notes (Addendum)
Initial Nutrition Assessment ? ?DOCUMENTATION CODES:  ? ?Severe malnutrition in context of chronic illness ? ?INTERVENTION:  ? ?Ensure Enlive po TID, each supplement provides 350 kcal and 20 grams of protein. ? ?MVI with minerals daily. ? ?Vitamin C 500 mg daily to promote wound healing. ? ?Zinc 220 mg daily x 14 days to promote wound healing.  ? ?NUTRITION DIAGNOSIS:  ? ?Severe Malnutrition related to chronic illness (chronic pain, venous insufficiency) as evidenced by severe muscle depletion, severe fat depletion, percent weight loss (23% weight loss x 1 year). ? ?GOAL:  ? ?Patient will meet greater than or equal to 90% of their needs ? ?MONITOR:  ? ?PO intake, Supplement acceptance, Labs, Skin ? ?REASON FOR ASSESSMENT:  ? ?Consult ?Assessment of nutrition requirement/status, Wound healing ? ?ASSESSMENT:  ? ?81 yo female admitted with necrotic abscess of right medial calf with cellulitis. PMH includes chronic pain, osteoarthritis, venous insufficiency, HTN, GERD, constipation, iron deficiency anemia, hypothyroidism, CVA. ? ?S/P excisional debridement of R calf with wound VAC placement on 3/15.  ?Plans for skin grafting next week.   ?Per [facility] note, patient's ABIs show non compressible vessels of RLE with TBI that would warrant poor wound healing. ?Plans for angiogram with possible intervention next week.  ? ?Patient states that she has been eating poorly since admission because she does not like the hospital food. She is on a regular diet. Reviewed menu with her and discussed ways to increase protein intake. She likes strawberry Ensure; RD to order TID between meals. Will also add MVI to promote healing.  ? ?Patient is currently on a regular diet. Meal intakes 50-100%. ? ?Labs reviewed. Na 133 ? ?Medications reviewed and include prednisone, Senokot-S, IV antibiotics. ? ?Usual weights reviewed. Patient has lost 23% of her usual weight over the past year.  ? ?Patient meets criteria for severe malnutrition with severe  depletion of muscle and subcutaneous fat mass with severe weight loss. ? ?NUTRITION - FOCUSED PHYSICAL EXAM: ? ?Flowsheet Row Most Recent Value  ?Orbital Region Severe depletion  ?Upper Arm Region Severe depletion  ?Thoracic and Lumbar Region Severe depletion  ?Buccal Region Severe depletion  ?Temple Region Severe depletion  ?Clavicle Bone Region Severe depletion  ?Clavicle and Acromion Bone Region Severe depletion  ?Scapular Bone Region Severe depletion  ?Dorsal Hand Severe depletion  ?Patellar Region Moderate depletion  ?Anterior Thigh Region Moderate depletion  ?Posterior Calf Region Moderate depletion  ?Edema (RD Assessment) Mild  ?Hair Reviewed  ?Eyes Reviewed  ?Mouth Reviewed  ?Skin Reviewed  ?Nails Reviewed  ? ?  ? ? ?Diet Order:   ?Diet Order   ? ?       ?  Diet regular Room service appropriate? Yes; Fluid consistency: Thin  Diet effective now       ?  ? ?  ?  ? ?  ? ? ?EDUCATION NEEDS:  ? ?Education needs have been addressed ? ?Skin:  Skin Assessment: Skin Integrity Issues: ?Skin Integrity Issues:: Wound VAC, Other (Comment) ?Wound Vac: R leg necrotic abscess S/P debridement ?Other: non pressure wounds, pretibial L&R ? ?Last BM:  3/17 type 2 ? ?Height:  ? ?Ht Readings from Last 1 Encounters:  ?02/10/22 5' (1.524 m)  ? ? ?Weight:  ? ?Wt Readings from Last 1 Encounters:  ?02/11/22 47 kg  ? ? ?BMI:  Body mass index is 20.25 kg/m?. ? ?Estimated Nutritional Needs:  ? ?Kcal:  1500-1700 ? ?Protein:  70-85 gm ? ?Fluid:  >/= 1.5 L ? ? ? ?Cala Bradford  H. RD, LDN, CNSC ?Please refer to Amion for contact information.                                                       ? ?

## 2022-02-12 NOTE — Plan of Care (Signed)
  Problem: Education: Goal: Knowledge of General Education information will improve Description Including pain rating scale, medication(s)/side effects and non-pharmacologic comfort measures Outcome: Progressing   

## 2022-02-12 NOTE — Progress Notes (Signed)
? ?                                                                                                                                                     ?                                                   ?Daily Progress Note  ? ?Patient Name: Isabel Barnes       Date: 02/12/2022 ?DOB: 02-25-1941  Age: 81 y.o. MRN#: [id number] ?Attending Physician: Lucious Groves, DO ?Primary Care Physician: Ardith Dark, PA-C ?Admit Date: 02/06/2022 ? ?Reason for Consultation/Follow-up: Establishing goals of care ? ?Subjective: ?Medical records reviewed. Patient assessed at the bedside. She reports feeling hungry, as well as a "raw mouth and tongue." ? ?Reviewed interval history since our last conversation and patient has a good understanding of her current care plan, treatment options, and continues to have excellent support from her 2 children.  Reviewed the MOST form and this PA assisted with completion in anticipation of discharge to SNF. Discussed symptom management and patient reports 8 out of 10 pain which improves to 6 out of 10 pain with medication. ? ?With patient's permission I then called her son [name] to discuss the above and offer updates/support.  We discussed plan for angiogram and skin graft next week.  He continues to voice his agreement with the goal to improve patient's function and prolong patient's life, with discharge to SNF and adding outpatient palliative care.  He plans to arrive Wednesday to support the patient.  We discussed caregiver need he tells me they have arranged for her current caregiver to be available additional time during the week. ? ?Questions and concerns addressed. PMT will continue to support holistically. ? ?Length of Stay: 6 ? ?Current Medications: ?Scheduled Meds:  ? sodium chloride   Intravenous Once  ? acetaminophen  1,000 mg Oral TID  ? amiodarone  200 mg Oral BID  ? cycloSPORINE  1 drop Both Eyes Daily  ? enoxaparin (LOVENOX) injection  30 mg Subcutaneous Daily  ? pantoprazole   40 mg Oral BID AC  ? polyethylene glycol  17 g Oral Once  ? predniSONE  5 mg Oral Q breakfast  ? rosuvastatin  5 mg Oral Daily  ? senna-docusate  1 tablet Oral BID  ? ? ?Continuous Infusions: ? cefTRIAXone (ROCEPHIN)  IV 2 g (02/11/22 1658)  ? vancomycin 1,250 mg (02/12/22 0851)  ? ? ?PRN Meds: ?menthol-cetylpyridinium, methocarbamol, phenol, traMADol **OR** traMADol, white petrolatum ? ?Physical Exam ?Vitals and nursing note reviewed.  ?Constitutional:   ?   General: She is not in acute distress. ?Cardiovascular:  ?   Rate and  Rhythm: Normal rate.  ?Pulmonary:  ?   Effort: Pulmonary effort is normal.  ?Skin: ?   General: Skin is warm and dry.  ?Neurological:  ?   Mental Status: She is alert and oriented to person, place, and time.  ?Psychiatric:     ?   Mood and Affect: Mood normal.  ?         ? ?Vital Signs: BP (!) 141/72 (BP Location: Right Arm)   Pulse 63   Temp 98.1 ?F (36.7 ?C) (Oral)   Resp 16   Ht 5' (1.524 m)   Wt 47 kg   SpO2 95%   BMI 20.25 kg/m?  ?SpO2: SpO2: 95 % ?O2 Device: O2 Device: Room Air ?O2 Flow Rate: O2 Flow Rate (L/min): 2 L/min ? ?Intake/output summary:  ?Intake/Output Summary (Last 24 hours) at 02/12/2022 [id number] ?Last data filed at 02/11/2022 2230 ?Gross per 24 hour  ?Intake 840 ml  ?Output 400 ml  ?Net 440 ml  ? ? ?LBM: Last BM Date : 02/10/22 ?Baseline Weight: Weight: 49.4 kg ?Most recent weight: Weight: 47 kg ? ?     ?Palliative Assessment/Data: 60% ? ? ? ? ? ?Patient Active Problem List  ? Diagnosis Date Noted  ? Abscess of right lower leg   ? Severe protein-calorie malnutrition ([place])   ? PVD (peripheral vascular disease) ([place])   ? Fall   ? Atrial fibrillation ([place])   ? Goals of care, counseling/discussion   ? Anemia   ? Fluid collection (edema) in the arms, legs, hands and feet   ? Cellulitis of right lower extremity 02/06/2022  ? Chest pain, rule out acute myocardial infarction 03/16/2021  ? HTN (hypertension) 03/16/2021  ? Hypothyroidism 03/16/2021  ? HLD (hyperlipidemia)  03/16/2021  ? Aortic valve stenosis 03/16/2021  ? Chronic venous insufficiency of lower extremity 03/16/2021  ? Short-term memory loss 12/13/2019  ? Psoriatic arthritis ([place]) 12/13/2019  ? Primary osteoarthritis of right knee 07/16/2016  ? Eosinophilia 06/16/2015  ? Chronic pain disorder   ? Osteoarthritis   ? Incisional umbilical hernia, without obstruction or gangrene   ? Iron deficiency anemia 09/19/2012  ? ? ?Palliative Care Assessment & Plan  ? ?Patient Profile: ?81 y.o. female  with past medical history of cerebellar stroke, iron deficiency anemia, GERD, HTN, hypothyroidism, aortic stenosis, ankylosing spondylitis, venous insufficiency and osteoarthritis admitted on 02/06/2022 with recurrent falls, weakness.  ?  ?Patient admitted for Afib likely secondary to acute cellulitis of the RLE. She does not wish for an aggressive workup of her conditions. PMT has been consulted to assist with goals of care conversation. ? ?Assessment: ?RLE cellulitis with necrotic abscess ?Aortic stenosis ?Afib with RVR ?Goals of care conversation ? ?Recommendations/Plan: ?Continue current care ?MOST form completed and placed on hard chart, scanned into Vynca and provided patient with a copy ?DNR form filled out and placed on her chart, provided patient with a copy and scanned into Vynca ?Patient has selected [facility] outpatient palliative care ?PMT will continue to follow ? ? ?Prognosis: ? Unable to determine ? ?Discharge Planning: ?[place] for rehab with Palliative care service follow-up ? ? ? ?MDM: High ? ? ?Dorthy Cooler, PA-C ?Palliative Medicine Team ?Team phone # [phone] ? ?Thank you for allowing the Palliative Medicine Team to assist in the care of this patient. Please utilize secure chat with additional questions, if there is no response within 30 minutes please call the above phone number. ? ?Palliative Medicine Team providers are available by phone from 7am  to 7pm daily and can be reached through  the team cell phone.  ?Should this patient require assistance outside of these hours, please call the patient's attending physician.  ? ? ?

## 2022-02-13 DIAGNOSIS — L03115 Cellulitis of right lower limb: Secondary | ICD-10-CM | POA: Diagnosis not present

## 2022-02-13 LAB — LIPID PANEL
Cholesterol: 125 mg/dL (ref 0–200)
HDL: 34 mg/dL — ABNORMAL LOW (ref >40)
LDL Cholesterol: 74 mg/dL (ref 0–99)
Total CHOL/HDL Ratio: 3.7 RATIO
Triglycerides: 84 mg/dL (ref <150)
VLDL: 17 mg/dL (ref 0–40)

## 2022-02-13 LAB — CBC
HCT: 30.9 % — ABNORMAL LOW (ref 36.0–46.0)
Hemoglobin: 9.9 g/dL — ABNORMAL LOW (ref 12.0–15.0)
MCH: 26.3 pg (ref 26.0–34.0)
MCHC: 32 g/dL (ref 30.0–36.0)
MCV: 82 fL (ref 80.0–100.0)
Platelets: 492 10*3/uL — ABNORMAL HIGH (ref 150–400)
RBC: 3.77 MIL/uL — ABNORMAL LOW (ref 3.87–5.11)
RDW: 15.9 % — ABNORMAL HIGH (ref 11.5–15.5)
WBC: 10.3 10*3/uL (ref 4.0–10.5)
nRBC: 0 % (ref 0.0–0.2)

## 2022-02-13 LAB — BASIC METABOLIC PANEL
Anion gap: 7 (ref 5–15)
BUN: 18 mg/dL (ref 8–23)
CO2: 26 mmol/L (ref 22–32)
Calcium: 8 mg/dL — ABNORMAL LOW (ref 8.9–10.3)
Chloride: 104 mmol/L (ref 98–111)
Creatinine, Ser: 0.64 mg/dL (ref 0.44–1.00)
GFR, Estimated: >60 mL/min (ref >60)
Glucose, Bld: 81 mg/dL (ref 70–99)
Potassium: 4.5 mmol/L (ref 3.5–5.1)
Sodium: 137 mmol/L (ref 135–145)

## 2022-02-13 NOTE — Progress Notes (Signed)
? ?HD#7 ?SUBJECTIVE:  ?Patient Summary: Isabel Barnes is a 81 y.o. with a pertinent PMH of previous cerebellar stroke, venous insufficiency, iron deficiency anemia, GERD, HTN, hypothyroidism, aortic stenosis, and ankylosing spondylitis, who presented with a fall and admitted for Afib likely secondary to acute cellulitis of the RLE ? ?Overnight Events: No acute events overnight ? ?Interim History: This is hospital day 7 for this patient who was seen and evaluated at the bedside this morning. She is in a lot of pain and notes that she is having difficulties sleeping because of the pain. Her appetite remains stable, but she is just generally uncomfortable.  ? ?OBJECTIVE:  ?Vital Signs: ?Vitals:  ? 02/12/22 2123 02/12/22 2333 02/13/22 0427 02/13/22 0720  ?BP: 117/83 121/70 132/67 (!) 149/66  ?Pulse: 66 77 68 76  ?Resp: _0 ?Temp: 97.8 ?F (36.6 ?C) 97.6 ?F (36.4 ?C)  97.7 ?F (36.5 ?C)  ?TempSrc: Oral Oral  Oral  ?SpO2: 98% 100% 94% 96%  ?Weight:      ?Height:      ? ?Supplemental O2: Room Air ?SpO2: 96 % ?O2 Flow Rate (L/min): 2 L/min ? ?Filed Weights  ? 02/06/22 1205 02/10/22 1336 02/11/22 0500  ?Weight: 49.4 kg 49.9 kg 47 kg  ? ? ? ?Intake/Output Summary (Last 24 hours) at 02/13/2022 1047 ?Last data filed at 02/13/2022 0931 ?Gross per 24 hour  ?Intake 360 ml  ?Output 750 ml  ?Net -390 ml  ? ?Net IO Since Admission: 1,862.56 mL [02/13/22 1047] ? ?Physical Exam: ?General: Pleasant, thin-appearing elderly female laying in bed. No acute distress, but appears uncomfortable. ?CV: RRR. IV/VI holosystolic murmur.  ?Pulmonary: Lungs CTAB. Normal effort. No wheezing or rales. ?Abdominal: Soft, nontender, nondistended. Normal bowel sounds. ?Extremities: RLE wrapped in ace bandage with wound VAC In place (VAC has ~300 cc serosanguinous output) ?Skin: Warm and dry. Venous stasis dermatitis.  ?Neuro: A&Ox3. No focal deficit. ?Psych: Normal mood and affect ? ? ? ?ASSESSMENT/PLAN:  ?Assessment: ?Principal Problem: ?   Cellulitis of right lower extremity ?Active Problems: ?  Aortic valve stenosis ?  Anemia ?  Fluid collection (edema) in the arms, legs, hands and feet ?  Fall ?  Atrial fibrillation ([place]) ?  Goals of care, counseling/discussion ?  Severe protein-calorie malnutrition ([place]) ?  PVD (peripheral vascular disease) ([place]) ?  Abscess of right lower leg ? ? ?Plan: ?##Cellulitis of right lower extremity ?#PAD right lower extremity  ?Patient underwent excisional debridement of right lower leg with ortho on 3/15 and was found to have necrotic soft tissue. Wound VAC placed at that time and was noted to have ~300 cc of serosanguinous output today. Surgical cultures growing enterococcus faecalis with susceptibilities that have not yet resulted. Plans to return to OR with ortho next week for a biologic tissue graft to the leg. Her pain continues to be an ongoing issue and has been difficult to control.  ?- Continue IV vanc and rocephin  ?- Tylenol 1000 mg tid  ?- Tramadol 50-100 mg q6h prn for moderate-severe pain ?- IV morphine 2 mg q6h for breakthrough pain (after tramadol given) ?- Robaxin 500 mg q8h prn for muscle spasms  ? ?#PAD ?ABIs show non compressible vessels of her RLE and abnormal TBI that could likely be the explanation for her poor wound healing. Vascular surgery was consulted yesterday with plans for an angiogram to be done early next week. LDL level 74.  ?- Continue crestor 5 mg daily  ?- Angiogram next week ? ?#  GI bleed, stable ?#Iron deficiency anemia ?#Constipation  ?Patient did not have a BM yesterday. Her Hb remains stable at 9.9 with no signs of bleeding noted.  ?- Protonix 40 mg bid ?- Daily CBC ?- Senna bid ?- Miralax prn  ? ?#New onset Afib ?Patient remains in NSR. Continue amiodarone 200 mg bid. ?  ?#Goals of care ?Patient met with the palliative team and noted that her primary concern is her decline in function and loss of independence. Family meeting was held and the plan will be to discharge patient to  SNF with outpatient palliative care follow up. TOC has been consulted to assist with SNF placememnt.  ?  ? ?Best Practice: ?Diet: Regular diet ?IVF: Fluids: none ?VTE: enoxaparin (LOVENOX) injection 30 mg Start: 02/11/22 1230 ?Code: DNR ?AB: Vanc, Rocephin ?Therapy Recs: SNF ?DISPO: Anticipated discharge  next week  to Skilled nursing facility pending Medical stability. ? ?Signature: ?Alaena Strader, D.O.  ?Internal Medicine Resident, PGY-1 ?[facility]  ?Pager: [phone] ?10:47 AM, 02/13/2022  ? ?Please contact the on call pager after 5 pm and on weekends at [phone]. ? ?

## 2022-02-14 DIAGNOSIS — I739 Peripheral vascular disease, unspecified: Secondary | ICD-10-CM

## 2022-02-14 LAB — CBC
HCT: 30.2 % — ABNORMAL LOW (ref 36.0–46.0)
Hemoglobin: 9.5 g/dL — ABNORMAL LOW (ref 12.0–15.0)
MCH: 26 pg (ref 26.0–34.0)
MCHC: 31.5 g/dL (ref 30.0–36.0)
MCV: 82.5 fL (ref 80.0–100.0)
Platelets: 453 10*3/uL — ABNORMAL HIGH (ref 150–400)
RBC: 3.66 MIL/uL — ABNORMAL LOW (ref 3.87–5.11)
RDW: 16.2 % — ABNORMAL HIGH (ref 11.5–15.5)
WBC: 9.3 10*3/uL (ref 4.0–10.5)
nRBC: 0 % (ref 0.0–0.2)

## 2022-02-14 MED ORDER — HYDROCORTISONE 1 % EX CREA
TOPICAL_CREAM | Freq: Two times a day (BID) | CUTANEOUS | Status: DC | PRN
  Filled 2022-02-14 (×2): qty 28

## 2022-02-14 MED ORDER — OXYCODONE HCL 5 MG PO TABS
5.0000 mg | ORAL_TABLET | Freq: Four times a day (QID) | ORAL | Status: DC | PRN
  Administered 2022-02-14 – 2022-02-15 (×3): 5 mg via ORAL
  Filled 2022-02-14 (×3): qty 1

## 2022-02-14 MED ORDER — POLYETHYLENE GLYCOL 3350 17 G PO PACK
17.0000 g | PACK | Freq: Every day | ORAL | Status: DC | PRN
  Administered 2022-02-14 – 2022-02-20 (×3): 17 g via ORAL
  Filled 2022-02-14 (×3): qty 1

## 2022-02-14 NOTE — H&P (View-Only) (Signed)
?  Progress Note ? ? ? ?02/14/2022 ?8:17 AM ?4 Days Post-Op ? ?Subjective:  no complaints ? ? ?Vitals:  ? 02/13/22 2142 02/14/22 0752  ?BP: (!) 124/59 (!) 142/71  ?Pulse: 60 62  ?Resp: 18 18  ?Temp: 97.8 ?F (36.6 ?C) 97.9 ?F (36.6 ?C)  ?SpO2: 97% 97%  ? ?Physical Exam: ?Cardiac:  regular ?Lungs:  non labored ?Incisions:  RLE wound dressed  ?Extremities:  2+ femoral pulses bilaterally, no palpable distal pulses. Edematous bilateral lower extremities with chronic venous changes ?Neurologic: alert and oriented ? ?CBC ?   ?Component Value Date/Time  ? WBC 9.3 02/14/2022 0118  ? RBC 3.66 (L) 02/14/2022 0118  ? HGB 9.5 (L) 02/14/2022 0118  ? HGB 12.0 02/17/2016 0920  ? HCT 30.2 (L) 02/14/2022 0118  ? HCT 37.1 02/17/2016 0920  ? PLT 453 (H) 02/14/2022 0118  ? PLT 318 02/17/2016 0920  ? MCV 82.5 02/14/2022 0118  ? MCV 88.0 02/17/2016 0920  ? MCH 26.0 02/14/2022 0118  ? MCHC 31.5 02/14/2022 0118  ? RDW 16.2 (H) 02/14/2022 0118  ? RDW 15.0 (H) 02/17/2016 0920  ? LYMPHSABS 0.7 02/06/2022 1239  ? LYMPHSABS 1.6 02/17/2016 0920  ? MONOABS 1.1 (H) 02/06/2022 1239  ? MONOABS 0.6 02/17/2016 0920  ? EOSABS 0.1 02/06/2022 1239  ? EOSABS 0.9 (H) 02/17/2016 0920  ? BASOSABS 0.1 02/06/2022 1239  ? BASOSABS 0.2 (H) 02/17/2016 0920  ? ? ?BMET ?   ?Component Value Date/Time  ? NA 137 02/13/2022 0258  ? NA 142 02/17/2016 0920  ? K 4.5 02/13/2022 0258  ? K 4.2 02/17/2016 0920  ? CL 104 02/13/2022 0258  ? CO2 26 02/13/2022 0258  ? CO2 26 02/17/2016 0920  ? GLUCOSE 81 02/13/2022 0258  ? GLUCOSE 93 02/17/2016 0920  ? BUN 18 02/13/2022 0258  ? BUN 13.3 02/17/2016 0920  ? CREATININE 0.64 02/13/2022 0258  ? CREATININE 0.7 02/17/2016 0920  ? CALCIUM 8.0 (L) 02/13/2022 0258  ? CALCIUM 8.8 02/17/2016 0920  ? GFRNONAA >60 02/13/2022 0258  ? GFRAA >60 06/25/2017 1506  ? ? ?INR ?   ?Component Value Date/Time  ? INR 1.3 (H) 02/09/2022 0133  ? ? ? ?Intake/Output Summary (Last 24 hours) at 02/14/2022 0817 ?Last data filed at 02/14/2022 0755 ?Gross per 24 hour   ?Intake --  ?Output 500 ml  ?Net -500 ml  ? ? ? ?Assessment/Plan:  81 y.o. female is s/p I&D right leg wound 4 Days Post-Op with PAD ? ?RLE wound care per Dr. Duda ?Reviewed procedure with patient including risk/ benefits/alternatives -she had no questions ?Plan is for Aortogram, arteriogram BLE with possible RLE intervention tomorrow 3/20 ?NPO after midnight. Consent order placed ? ? ? ?Corrina Baglia, PA-C ?[facility] ?[phone] ?02/14/2022 ?8:17 AM ? ?VASCULAR STAFF ADDENDUM: ?I have independently interviewed and examined the patient. ?I agree with the above.  ? ?Keshayla Schrum N. Buena Boehm, MD ?[facility] [place] ?Office Phone Number: [phone] ?02/14/2022 11:01 AM ? ? ?

## 2022-02-14 NOTE — Progress Notes (Addendum)
?  Progress Note ? ? ? ?02/14/2022 ?8:17 AM ?4 Days Post-Op ? ?Subjective:  no complaints ? ? ?Vitals:  ? 02/13/22 2142 02/14/22 0752  ?BP: (!) 124/59 (!) 142/71  ?Pulse: 60 62  ?Resp: 18 18  ?Temp: 97.8 ?F (36.6 ?C) 97.9 ?F (36.6 ?C)  ?SpO2: 97% 97%  ? ?Physical Exam: ?Cardiac:  regular ?Lungs:  non labored ?Incisions:  RLE wound dressed  ?Extremities:  2+ femoral pulses bilaterally, no palpable distal pulses. Edematous bilateral lower extremities with chronic venous changes ?Neurologic: alert and oriented ? ?CBC ?   ?Component Value Date/Time  ? WBC 9.3 02/14/2022 0118  ? RBC 3.66 (L) 02/14/2022 0118  ? HGB 9.5 (L) 02/14/2022 0118  ? HGB 12.0 02/17/2016 0920  ? HCT 30.2 (L) 02/14/2022 0118  ? HCT 37.1 02/17/2016 0920  ? PLT 453 (H) 02/14/2022 0118  ? PLT 318 02/17/2016 0920  ? MCV 82.5 02/14/2022 0118  ? MCV 88.0 02/17/2016 0920  ? MCH 26.0 02/14/2022 0118  ? MCHC 31.5 02/14/2022 0118  ? RDW 16.2 (H) 02/14/2022 0118  ? RDW 15.0 (H) 02/17/2016 0920  ? LYMPHSABS 0.7 02/06/2022 1239  ? LYMPHSABS 1.6 02/17/2016 0920  ? MONOABS 1.1 (H) 02/06/2022 1239  ? MONOABS 0.6 02/17/2016 0920  ? EOSABS 0.1 02/06/2022 1239  ? EOSABS 0.9 (H) 02/17/2016 0920  ? BASOSABS 0.1 02/06/2022 1239  ? BASOSABS 0.2 (H) 02/17/2016 0920  ? ? ?BMET ?   ?Component Value Date/Time  ? NA 137 02/13/2022 0258  ? NA 142 02/17/2016 0920  ? K 4.5 02/13/2022 0258  ? K 4.2 02/17/2016 0920  ? CL 104 02/13/2022 0258  ? CO2 26 02/13/2022 0258  ? CO2 26 02/17/2016 0920  ? GLUCOSE 81 02/13/2022 0258  ? GLUCOSE 93 02/17/2016 0920  ? BUN 18 02/13/2022 0258  ? BUN 13.3 02/17/2016 0920  ? CREATININE 0.64 02/13/2022 0258  ? CREATININE 0.7 02/17/2016 0920  ? CALCIUM 8.0 (L) 02/13/2022 0258  ? CALCIUM 8.8 02/17/2016 0920  ? GFRNONAA >60 02/13/2022 0258  ? GFRAA >60 06/25/2017 1506  ? ? ?INR ?   ?Component Value Date/Time  ? INR 1.3 (H) 02/09/2022 0133  ? ? ? ?Intake/Output Summary (Last 24 hours) at 02/14/2022 0817 ?Last data filed at 02/14/2022 0755 ?Gross per 24 hour   ?Intake --  ?Output 500 ml  ?Net -500 ml  ? ? ? ?Assessment/Plan:  81 y.o. female is s/p I&D right leg wound 4 Days Post-Op with PAD ? ?RLE wound care per Dr. Lajoyce Corners ?Reviewed procedure with patient including risk/ benefits/alternatives -she had no questions ?Plan is for Aortogram, arteriogram BLE with possible RLE intervention tomorrow 3/20 ?NPO after midnight. Consent order placed ? ? ? ?Graceann Congress, PA-C ?[facility] ?[phone] ?02/14/2022 ?8:17 AM ? ?VASCULAR STAFF ADDENDUM: ?I have independently interviewed and examined the patient. ?I agree with the above.  ? ?Rande Brunt. Lenell Antu, MD ?[facility] [place] ?Office Phone Number: [phone] ?02/14/2022 11:01 AM ? ? ?

## 2022-02-14 NOTE — Progress Notes (Signed)
? ?Subjective:  ? ?Hospital day: 7 ? ?Overnight event: No acute events overnight ? ?Interim history: Patient reports she is feeling okay. She continues to endorse pain in her legs, worse on the right. States the tramadol only lasts about 4 hours. She had a bowel movement 2 days ago but none yesterday or today. She denies any fevers or chills. ? ?Objective: ? ?Vital signs in last 24 hours: ?Vitals:  ? 02/13/22 0427 02/13/22 0720 02/13/22 1555 02/13/22 2142  ?BP: 132/67 (!) 149/66 (!) 131/58 (!) 124/59  ?Pulse: 68 76 64 60  ?Resp: '17 18 16 18  ' ?Temp:  97.7 ?F (36.5 ?C) (!) 97.3 ?F (36.3 ?C) 97.8 ?F (36.6 ?C)  ?TempSrc:  Oral Oral Oral  ?SpO2: 94% 96% 100% 97%  ?Weight:      ?Height:      ? ? ?Filed Weights  ? 02/06/22 1205 02/10/22 1336 02/11/22 0500  ?Weight: 49.4 kg 49.9 kg 47 kg  ? ? ? ?Intake/Output Summary (Last 24 hours) at 02/14/2022 0717 ?Last data filed at 02/14/2022 [phone] ?Gross per 24 hour  ?Intake --  ?Output 450 ml  ?Net -450 ml  ? ?Net IO Since Admission: 1,862.56 mL [02/14/22 0717] ? ?No results for input(s): GLUCAP in the last 72 hours.  ? ?Pertinent Labs: ?CBC Latest Ref Rng & Units 02/14/2022 02/13/2022 02/12/2022  ?WBC 4.0 - 10.5 K/uL 9.3 10.3 15.7(H)  ?Hemoglobin 12.0 - 15.0 g/dL 9.5(L) 9.9(L) 8.9(L)  ?Hematocrit 36.0 - 46.0 % 30.2(L) 30.9(L) 27.9(L)  ?Platelets 150 - 400 K/uL 453(H) 492(H) 441(H)  ? ? ?CMP Latest Ref Rng & Units 02/13/2022 02/12/2022 02/11/2022  ?Glucose 70 - 99 mg/dL 81 120(H) 164(H)  ?BUN 8 - 23 mg/dL '18 23 16  ' ?Creatinine 0.44 - 1.00 mg/dL 0.64 0.70 0.54  ?Sodium 135 - 145 mmol/L 137 133(L) 136  ?Potassium 3.5 - 5.1 mmol/L 4.5 4.7 5.3(H)  ?Chloride 98 - 111 mmol/L 104 102 102  ?CO2 22 - 32 mmol/L '26 25 23  ' ?Calcium 8.9 - 10.3 mg/dL 8.0(L) 7.8(L) 7.9(L)  ?Total Protein 6.5 - 8.1 g/dL - - -  ?Total Bilirubin 0.3 - 1.2 mg/dL - - -  ?Alkaline Phos 38 - 126 U/L - - -  ?AST 15 - 41 U/L - - -  ?ALT 0 - 44 U/L - - -  ? ? ?Imaging: ?No results found. ? ?Physical Exam ? ?General: Pleasant,  chronically ill-appearing elderly woman laying in bed. In mild distress due to pain. ?CV: RRR. IV/VI holosystolic murmur.  No rubs or gallops. ?Pulmonary: Lungs CTAB. Normal effort. No wheezing or rales. ?Abdominal: Soft, nontender, nondistended. Normal bowel sounds. ?Extremities: Normal ROM. RLE covered with Ace bandage with wound VAC in place, ~350 cc of serosanguineous output. Moderate tenderness to palpation of the lower extremity. ?Skin: Warm and dry. Venous stasis dermatitis ?Neuro: A&Ox3. Normal sensation to gross touch. ?Psych: Normal mood and affect ? ?Assessment/Plan: ?Isabel Barnes is a 81 y.o. female with hx of previous cerebellar stroke, venous insufficiency, iron deficiency anemia, GERD, HTN, hypothyroidism, aortic stenosis, and ankylosing spondylitis, who presented with a fall and admitted for Afib likely secondary to acute cellulitis of the RLE. Now s/p excisional debridement of RLE soft tissue and pending biologic tissue graft to the leg next week. ? ?Principal Problem: ?  Cellulitis of right lower extremity ?Active Problems: ?  Aortic valve stenosis ?  Anemia ?  Fluid collection (edema) in the arms, legs, hands and feet ?  Fall ?  Atrial fibrillation (  [place]) ?  Goals of care, counseling/discussion ?  Severe protein-calorie malnutrition ([place]) ?  PVD (peripheral vascular disease) ([place]) ?  Abscess of right lower leg ? ?#Cellulitis of right lower extremity ?#RLE abscess ?Patient underwent excisional debridement of right lower leg with ortho on 3/15 and was found to have necrotic soft tissue. Wound VAC placed, now with 350 cc of serosanguineous fluid. Surgical cultures grew enterococcus faecalis susceptible to vancomycin.  Plans to return to OR with ortho next week for a biologic tissue graft to the leg.  Pain is still not controlled on max dose of tramadol. Patient tolerated Oxy previously in the PACU during this admission. We will trial patient on oxycodone and monitor changes in pain.  ?- Continue  IV vanc and rocephin  ?- Tylenol 1000 mg TID ?- Discontinue tramadol and start Oxy 5 mg q6h prn for moderate-severe pain ?- IV morphine 2 mg q6h for breakthrough pain (after tramadol given) ?- Robaxin 500 mg q8h prn for muscle spasms  ?  ?#PAD RLE ?ABIs show non compressible vessels of her RLE and abnormal TBI. Patient continues to have pain in her RLE.  ?--Vascular following, plan for aortogram, arteriogram BLE with possible RLE intervention tomorrow 3/20 ?--N.p.o. at midnight ?--Continue crestor 5 mg daily  ?  ?#GI bleed, stable ?#Iron deficiency anemia ?#Constipation  ?Patient denies any bowel movement in the past 2 days.  Hemoglobin stable at 9.5.  We will modify bowel regimen and monitor. ?- Protonix 40 mg bid ?- Daily CBC ?- Senna bid ?- Miralax prn  ?  ?#New onset Afib ?Patient remains in NSR. Continue amiodarone 200 mg bid. ?  ?#Goals of care ?Patient met with the palliative team and noted that her primary concern is her decline in function and loss of independence. Family meeting was held and the plan will be to discharge patient to SNF with outpatient palliative care follow up. TOC has been consulted to assist with SNF placememnt.  ?  ? ?Diet: Regular ?IVF: None ?VTE: Lovenox ?CODE: DNR ? ?Prior to Admission Living Arrangement: Home ?Anticipated Discharge Location: SNF ?Barriers to Discharge: Medical Stability ?Dispo: Anticipated discharge in approximately 1-2 day(s).  ? ?Signed: ?Lacinda Axon, MD ?02/14/2022, 7:17 AM  ?Pager: [phone] ?Internal Medicine Teaching Service ?After 5pm on weekdays and 1pm on weekends: On Call pager: [phone] ? ?

## 2022-02-15 ENCOUNTER — Encounter (HOSPITAL_COMMUNITY)
Admission: EM | Disposition: A | Discharge status: Skilled Nursing Facility | Payer: Self-pay | Source: Home / Self Care | Attending: Internal Medicine

## 2022-02-15 DIAGNOSIS — I739 Peripheral vascular disease, unspecified: Secondary | ICD-10-CM | POA: Diagnosis not present

## 2022-02-15 DIAGNOSIS — L03115 Cellulitis of right lower limb: Secondary | ICD-10-CM | POA: Diagnosis not present

## 2022-02-15 DIAGNOSIS — E43 Unspecified severe protein-calorie malnutrition: Secondary | ICD-10-CM | POA: Diagnosis not present

## 2022-02-15 DIAGNOSIS — I9789 Other postprocedural complications and disorders of the circulatory system, not elsewhere classified: Secondary | ICD-10-CM

## 2022-02-15 DIAGNOSIS — Z7189 Other specified counseling: Secondary | ICD-10-CM | POA: Diagnosis not present

## 2022-02-15 HISTORY — PX: ABDOMINAL AORTOGRAM W/LOWER EXTREMITY: CATH118223

## 2022-02-15 LAB — CBC
HCT: 29.6 % — ABNORMAL LOW (ref 36.0–46.0)
Hemoglobin: 9.3 g/dL — ABNORMAL LOW (ref 12.0–15.0)
MCH: 26.2 pg (ref 26.0–34.0)
MCHC: 31.4 g/dL (ref 30.0–36.0)
MCV: 83.4 fL (ref 80.0–100.0)
Platelets: 427 10*3/uL — ABNORMAL HIGH (ref 150–400)
RBC: 3.55 MIL/uL — ABNORMAL LOW (ref 3.87–5.11)
RDW: 16.3 % — ABNORMAL HIGH (ref 11.5–15.5)
WBC: 8.6 10*3/uL (ref 4.0–10.5)
nRBC: 0 % (ref 0.0–0.2)

## 2022-02-15 SURGERY — ABDOMINAL AORTOGRAM W/LOWER EXTREMITY
Anesthesia: LOCAL

## 2022-02-15 MED ORDER — ONDANSETRON HCL 4 MG/2ML IJ SOLN
4.0000 mg | Freq: Four times a day (QID) | INTRAMUSCULAR | Status: DC | PRN
  Administered 2022-02-16 – 2022-02-17 (×2): 4 mg via INTRAVENOUS
  Filled 2022-02-15 (×2): qty 2

## 2022-02-15 MED ORDER — METHYLPREDNISOLONE SODIUM SUCC 125 MG IJ SOLR
125.0000 mg | Freq: Once | INTRAMUSCULAR | Status: DC
Start: 2022-02-15 — End: 2022-02-15

## 2022-02-15 MED ORDER — SODIUM CHLORIDE 0.9 % IV SOLN
INTRAVENOUS | Status: DC
Start: 2022-02-15 — End: 2022-02-15

## 2022-02-15 MED ORDER — SODIUM CHLORIDE 0.9 % IV SOLN: 250.0000 mL | INTRAVENOUS | Status: DC | PRN

## 2022-02-15 MED ORDER — LIDOCAINE HCL (PF) 1 % IJ SOLN
INTRAMUSCULAR | Status: DC | PRN
  Administered 2022-02-15: 10 mL

## 2022-02-15 MED ORDER — DIPHENHYDRAMINE HCL 50 MG/ML IJ SOLN: 25.0000 mg | Freq: Once | INTRAMUSCULAR | Status: DC

## 2022-02-15 MED ORDER — SODIUM CHLORIDE 0.9% FLUSH
3.0000 mL | Freq: Two times a day (BID) | INTRAVENOUS | Status: DC
  Administered 2022-02-15 – 2022-02-22 (×12): 3 mL via INTRAVENOUS

## 2022-02-15 MED ORDER — HYDRALAZINE HCL 20 MG/ML IJ SOLN: 5.0000 mg | INTRAMUSCULAR | Status: DC | PRN

## 2022-02-15 MED ORDER — SODIUM CHLORIDE 0.9 % IV SOLN: INTRAVENOUS | Status: AC

## 2022-02-15 MED ORDER — FENTANYL CITRATE (PF) 100 MCG/2ML IJ SOLN
INTRAMUSCULAR | Status: DC | PRN
  Administered 2022-02-15: 50 ug via INTRAVENOUS

## 2022-02-15 MED ORDER — HEPARIN (PORCINE) IN NACL 1000-0.9 UT/500ML-% IV SOLN
INTRAVENOUS | Status: DC | PRN
  Administered 2022-02-15 (×2): 500 mL

## 2022-02-15 MED ORDER — LABETALOL HCL 5 MG/ML IV SOLN: 10.0000 mg | INTRAVENOUS | Status: DC | PRN

## 2022-02-15 MED ORDER — SODIUM CHLORIDE 0.9% FLUSH: 3.0000 mL | INTRAVENOUS | Status: DC | PRN

## 2022-02-15 MED ORDER — FENTANYL CITRATE (PF) 100 MCG/2ML IJ SOLN
INTRAMUSCULAR | Status: AC
  Filled 2022-02-15: qty 2

## 2022-02-15 MED ORDER — OXYCODONE HCL 5 MG PO TABS
5.0000 mg | ORAL_TABLET | ORAL | Status: DC | PRN
  Administered 2022-02-15 – 2022-02-22 (×24): 5 mg via ORAL
  Filled 2022-02-15 (×24): qty 1

## 2022-02-15 MED ORDER — HEPARIN (PORCINE) IN NACL 1000-0.9 UT/500ML-% IV SOLN
INTRAVENOUS | Status: AC
  Filled 2022-02-15: qty 1000

## 2022-02-15 MED ORDER — LIDOCAINE HCL (PF) 1 % IJ SOLN
INTRAMUSCULAR | Status: AC
  Filled 2022-02-15: qty 30

## 2022-02-15 MED ORDER — MIDAZOLAM HCL 2 MG/2ML IJ SOLN
INTRAMUSCULAR | Status: DC | PRN
  Administered 2022-02-15: .5 mg via INTRAVENOUS

## 2022-02-15 MED ORDER — IODIXANOL 320 MG/ML IV SOLN
INTRAVENOUS | Status: DC | PRN
  Administered 2022-02-15: 107 mL via INTRA_ARTERIAL

## 2022-02-15 MED ORDER — MIDAZOLAM HCL 2 MG/2ML IJ SOLN
INTRAMUSCULAR | Status: AC
  Filled 2022-02-15: qty 2

## 2022-02-15 MED ORDER — ACETAMINOPHEN 325 MG PO TABS
650.0000 mg | ORAL_TABLET | ORAL | Status: DC | PRN
  Administered 2022-02-21: 650 mg via ORAL
  Filled 2022-02-15 (×2): qty 2

## 2022-02-15 SURGICAL SUPPLY — 8 items
CATH OMNI FLUSH 5F 65CM (CATHETERS) ×1 IMPLANT
KIT MICROPUNCTURE NIT STIFF (SHEATH) ×1 IMPLANT
KIT PV (KITS) ×2 IMPLANT
SHEATH PINNACLE 5F 10CM (SHEATH) ×1 IMPLANT
SYR MEDRAD MARK 7 150ML (SYRINGE) ×2 IMPLANT
TRANSDUCER W/STOPCOCK (MISCELLANEOUS) ×2 IMPLANT
TRAY PV CATH (CUSTOM PROCEDURE TRAY) ×2 IMPLANT
WIRE BENTSON .035X145CM (WIRE) ×1 IMPLANT

## 2022-02-15 NOTE — Progress Notes (Signed)
Physical Therapy Treatment ?Patient Details ?Name: Isabel Barnes ?MRN: [id number] ?DOB: 04/19/1941 ?Today's Date: 02/15/2022 ? ? ?History of Present Illness Pt is a 81 y.o. F who presents 02/06/2022 after a fall. CT head negative for acute abnormality. Hip x-ray negative for acute fracture. Found to have new a-fib in setting of moderate to severe AS. Significant PMH: cerebellar stroke, anemia, HTN, aortic stenosis, ankylosing spondylitis, venous insufficiency. ? ?  ?PT Comments  ? ? Pt received in supine, agreeable to therapy session and with good participation in transfer training at bedside. Pt performed multiple standing and seated exercises, c/o significant RLE pain and L hip pain with weight bearing so limited to sidestep along EOB but defer gait progression. Pt also plan for OR today for possible RLE intervention shortly after session. VSS on RA. Pt needs min to modA for balance assist with dynamic standing tasks and minA for sit<>stand transfers. Pt continues to benefit from PT services to progress toward functional mobility goals.    ?Recommendations for follow up therapy are one component of a multi-disciplinary discharge planning process, led by the attending physician.  Recommendations may be updated based on patient status, additional functional criteria and insurance authorization. ? ?Follow Up Recommendations ? Skilled nursing-short term rehab (<3 hours/day) ?  ?  ?Assistance Recommended at Discharge Frequent or constant Supervision/Assistance  ?Patient can return home with the following A little help with bathing/dressing/bathroom;Assistance with cooking/housework;Assist for transportation;Help with stairs or ramp for entrance;A lot of help with walking and/or transfers ?  ?Equipment Recommendations ? BSC/3in1 (pt unable to ambulate back to bathroom at this time due to pain)  ?  ?Recommendations for Other Services   ? ? ?  ?Precautions / Restrictions Precautions ?Precautions: Fall ?Precaution Comments:  wound vac RLE ?Restrictions ?Weight Bearing Restrictions: Yes ?RLE Weight Bearing: Weight bearing as tolerated  ?  ? ?Mobility ? Bed Mobility ?Overal bed mobility: Needs Assistance ?Bed Mobility: Rolling, Sidelying to Sit, Sit to Sidelying ?Rolling: Supervision ?Sidelying to sit: Min guard ?  ?  ?Sit to sidelying: Min guard ?General bed mobility comments: heavy reliance on bed rail, good recall of log roll sequencing from previous sessions ?  ? ?Transfers ?Overall transfer level: Needs assistance ?Equipment used: Rolling walker (2 wheels), 1 person hand held assist ?Transfers: Sit to/from Stand ?Sit to Stand: Min assist ?  ?  ?  ?  ?  ?General transfer comment: minA to rise and steady at RW ?  ? ?Ambulation/Gait ?Ambulation/Gait assistance: Min assist ?  ?Assistive device: Rolling walker (2 wheels) ?Gait Pattern/deviations: Step-to pattern, Trunk flexed, Decreased dorsiflexion - right ?  ?  ?Pre-gait activities: sidestep along EOB and standing RLE hip flexion; defer gait due to pt high pain score in RLE and plan to go to OR in the next hour for procedure ?  ? ? ?  ?Balance Overall balance assessment: Needs assistance ?Sitting-balance support: Feet supported, Single extremity supported ?Sitting balance-Leahy Scale: Fair ?Sitting balance - Comments: pt guarding due to pain in L hip, able to perform a couple reaching/BUE exercises but hip pain limiting activity without BUE support on bed ?  ?Standing balance support: Bilateral upper extremity supported ?Standing balance-Leahy Scale: Poor ?Standing balance comment: RW and min/modA for dynamic standing tasks (therex) ?   ? ?  ?Cognition Arousal/Alertness: Awake/alert ?Behavior During Therapy: [facility] for tasks assessed/performed ?Overall Cognitive Status: Within Functional Limits for tasks assessed ?Area of Impairment: Memory, Safety/judgement ?   ?Memory: Decreased short-term memory ?  ?Safety/Judgement: Decreased awareness  of safety ?  ?  ?General Comments: Pleasantly  cooperative, pain limiting mobility progression ?  ?  ? ?  ?Exercises Other Exercises ?Other Exercises: supine posterior pelvic tilt x10 reps, tactile cues for technique and vcs for breathing ?Other Exercises: seated BLE AROM: LAQ, hip flexion x5-10 reps ea ?Other Exercises: standing RLE AROM: marches, hip flexion with knee ext, hip abduction x10 reps ea ?Other Exercises: mini squats x3 reps (RLE pain limiting and deferred more) ?Other Exercises: STS x 2 trials ? ?  ?General Comments General comments (skin integrity, edema, etc.): SpO2 94% on RA, HR 59 bpm resting; no acute s/sx distress ?  ?  ? ?Pertinent Vitals/Pain Pain Assessment ?Pain Assessment: Faces ?Faces Pain Scale: Hurts whole lot ?Pain Location: R calf/distal to R knee, bilateral hips (L>R) ?Pain Descriptors / Indicators: Shooting, Sharp, Burning, Grimacing ?Pain Intervention(s): Limited activity within patient's tolerance, Monitored during session, Repositioned, Patient requesting pain meds-RN notified  ? ? ? ?PT Goals (current goals can now be found in the care plan section) Acute Rehab PT Goals ?Patient Stated Goal: less pain ?PT Goal Formulation: With patient ?Time For Goal Achievement: 02/21/22 ?Progress towards PT goals: Progressing toward goals ? ?  ?Frequency ? ? ? Min 3X/week ? ? ? ?  ?PT Plan Current plan remains appropriate  ? ? ?   ?AM-PAC PT "6 Clicks" Mobility   ?Outcome Measure ? Help needed turning from your back to your side while in a flat bed without using bedrails?: A Little ?Help needed moving from lying on your back to sitting on the side of a flat bed without using bedrails?: A Little ?Help needed moving to and from a bed to a chair (including a wheelchair)?: A Lot (mod cues for this and item below) ?Help needed standing up from a chair using your arms (e.g., wheelchair or bedside chair)?: A Lot ?Help needed to walk in hospital room?: Total ?Help needed climbing 3-5 steps with a railing? : Total ?6 Click Score: 12 ? ?  ?End of  Session Equipment Utilized During Treatment: Gait belt ?Activity Tolerance: Patient tolerated treatment well;Patient limited by pain ?Patient left: with call bell/phone within reach;in bed;with bed alarm set ?Nurse Communication: Mobility status;Patient requests pain meds ?PT Visit Diagnosis: Unsteadiness on feet (R26.81);Muscle weakness (generalized) (M62.81);History of falling (Z91.81) ?  ? ? ?Time: [id number] ?PT Time Calculation (min) (ACUTE ONLY): 21 min ? ?Charges:  $Therapeutic Exercise: 8-22 mins          ?          ? ?Franciszek Platten P., PTA ?Acute Rehabilitation Services ?Pager: [phone] ?Office: [phone]  ? ? ?Kara Pacer Javani Spratt ?02/15/2022, 12:09 PM ? ?

## 2022-02-15 NOTE — Progress Notes (Signed)
Pharmacy Antibiotic Note ? ?Isabel Barnes is a 81 y.o. female admitted on 02/06/2022 with RLE abscess. Pt continues on Rocephin (Day #10) and Vancomycin (Day #8). RLE cultures with E faecalis (amp-sensitive); unfortunately pt with allergy to PCN (rash) so continuing on vancomycin. ?Patient s/p debridement of leg abscess 3/15, wound vac in place. Plan back to OR today for for biologic tissue graft. ? ?SCr has remained stable. ? ?Plan: ?Ceftriaxone 2g IV Q24h. D/w rounding team about discontinuing this medication ?Vancomycin 1250mg  IV Q48h (goal AUC 400-550, eAUC 479, Scr rounded to 0.8) ?Will consider levels with 3/21 dose depending on length of therapy ? ?Height: 5' (152.4 cm) ?Weight: 47.2 kg (104 lb 0.9 oz) ?IBW/kg (Calculated) : 45.5 ? ?Temp (24hrs), Avg:97.5 ?F (36.4 ?C), Min:97.3 ?F (36.3 ?C), Max:97.8 ?F (36.6 ?C) ? ?Recent Labs  ?Lab 02/09/22 ?0133 02/10/22 ?0234 02/11/22 ?0146 02/12/22 ?0145 02/13/22 ?0258 02/14/22 ?0118 02/15/22 ?0510  ?WBC 11.4* 10.9* 9.6 15.7* 10.3 9.3 8.6  ?CREATININE 0.70 0.67 0.54 0.70 0.64  --   --   ? ?  ?Estimated Creatinine Clearance: 38.9 mL/min (by C-G formula based on SCr of 0.64 mg/dL).   ? ?Allergies  ?Allergen Reactions  ? Dilaudid [Hydromorphone Hcl] Shortness Of Breath  ? Gabapentin Other (See Comments)  ?  Hoarseness , headache and sore throat  ? Latex Rash  ?  Severe rash  ? Lyrica [Pregabalin] Other (See Comments)  ?  No balance , had to walk with cane , Blurred vision,weakness.  ? Oxycodone Shortness Of Breath and Other (See Comments)  ?  Cannot breathe.  ? Singulair [Montelukast] Shortness Of Breath and Other (See Comments)  ?  Vision issues, also  ? Ciprofloxacin Hcl Nausea And Vomiting and Other (See Comments)  ?  Nausea and vomiting with by mouth form  ? Codeine Other (See Comments)  ?  Hallucinations  ? Methadone Nausea And Vomiting and Other (See Comments)  ?  Severe nausea and vomiting  ? Metronidazole Nausea And Vomiting and Other (See Comments)  ?  Gastric  pain  ? Oysters [Shellfish Allergy] Other (See Comments)  ?  "Terrible gastric upset and cramping."  ? Clindamycin/Lincomycin Diarrhea and Nausea Only  ? Donepezil Diarrhea and Other (See Comments)  ?  Severe diarrhea  ? Sulfa Antibiotics Diarrhea and Other (See Comments)  ?  GI issues ?  ? Tape Other (See Comments)  ?  Severe rash  ? Zetia [Ezetimibe] Diarrhea  ? Elemental Sulfur Nausea And Vomiting  ? Iodine Rash  ? Other Rash  ?  All Antibiotic ointments/ creams  ? Oyster Shell Rash  ? Penicillin G Nausea And Vomiting and Rash  ? Penicillins Nausea And Vomiting and Rash  ?  Has patient had a PCN reaction causing immediate rash, facial/tongue/throat swelling, SOB or lightheadedness with hypotension: Yes ?Has patient had a PCN reaction causing severe rash involving mucus membranes or skin necrosis: No ?Has patient had a PCN reaction that required hospitalization No ?Has patient had a PCN reaction occurring within the last 10 years: No ?If all of the above answers are "NO", then may proceed with Cephalosporin use. ?  ? Povidone Iodine Rash and Other (See Comments)  ?  Oyster shell products- Rash ?  ? Skintegrity Hydrogel [Skin Protectants, Misc.] Rash  ? Tapentadol Other (See Comments)  ?  Nightmares  ? ? ?Antimicrobials this admission: ?Ceftriaxone 3/11 >> ?Vanc 3/13 >> ? ? ?Microbiology results: ?3/11 Bcx: NG ?3/15 Rt leg tissue: enterococcus faecalis (amp  sensitive) ? ? ?Thank you for allowing pharmacy to be a part of this patient?s care. ? ?Christoper Fabian, PharmD, BCPS ?Please see amion for complete clinical pharmacist phone list ?02/15/2022 9:06 AM ? ?

## 2022-02-15 NOTE — Progress Notes (Signed)
? ?                                                                                                                                                     ?                                                   ?Daily Progress Note  ? ?Patient Name: Isabel Barnes       Date: 02/15/2022 ?DOB: Oct 21, 1941  Age: 81 y.o. MRN#: [id number] ?Attending Physician: Gust Rung, DO ?Primary Care Physician: Jamal Collin, PA-C ?Admit Date: 02/06/2022 ? ?Reason for Consultation/Follow-up: Establishing goals of care ? ?Subjective: ?Medical records reviewed. Patient assessed at the bedside. She reports feeling tired after today's procedure. Goals of care remain consistent and patient tells me she is ready to "be done with this," referring to RLE wound management.  ? ?Attempted to call patient's son Loraine Leriche to provide updates and support, was unable to reach. I then called patient's daughter Jacki Cones as requested by patient. ? ?Questions and concerns addressed. PMT will continue to support holistically. ? ?Length of Stay: 9 ? ?Current Medications: ?Scheduled Meds:  ? sodium chloride   Intravenous Once  ? acetaminophen  1,000 mg Oral TID  ? amiodarone  200 mg Oral BID  ? vitamin C  500 mg Oral Daily  ? cycloSPORINE  1 drop Both Eyes Daily  ? enoxaparin (LOVENOX) injection  30 mg Subcutaneous Daily  ? feeding supplement  237 mL Oral TID BM  ? multivitamin with minerals  1 tablet Oral Daily  ? pantoprazole  40 mg Oral BID AC  ? polyethylene glycol  17 g Oral Once  ? predniSONE  5 mg Oral Q breakfast  ? rosuvastatin  5 mg Oral Daily  ? senna-docusate  1 tablet Oral BID  ? sodium chloride flush  3 mL Intravenous Q12H  ? zinc sulfate  220 mg Oral Daily  ? ? ?Continuous Infusions: ? sodium chloride Stopped (02/15/22 1412)  ? sodium chloride    ? vancomycin 1,250 mg (02/14/22 1018)  ? ? ?PRN Meds: ?sodium chloride, acetaminophen, hydrALAZINE, hydrocortisone cream, labetalol, menthol-cetylpyridinium, methocarbamol, morphine injection,  ondansetron (ZOFRAN) IV, oxyCODONE, phenol, polyethylene glycol, sodium chloride flush, white petrolatum ? ?Physical Exam ?Vitals and nursing note reviewed.  ?Constitutional:   ?   General: She is not in acute distress. ?Cardiovascular:  ?   Rate and Rhythm: Normal rate.  ?Pulmonary:  ?   Effort: Pulmonary effort is normal.  ?Skin: ?   General: Skin is warm and dry.  ?Neurological:  ?   Mental Status: She is alert and oriented to person, place, and time.  ?Psychiatric:     ?  Mood and Affect: Mood normal.  ?         ? ?Vital Signs: BP (!) 167/58   Pulse (!) 59   Temp 97.6 ?F (36.4 ?C) (Oral)   Resp 17   Ht 5' (1.524 m)   Wt 47.2 kg   SpO2 100%   BMI 20.32 kg/m?  ?SpO2: SpO2: 100 % ?O2 Device: O2 Device: Room Air ?O2 Flow Rate: O2 Flow Rate (L/min): 2 L/min ? ?Intake/output summary:  ?Intake/Output Summary (Last 24 hours) at 02/15/2022 1446 ?Last data filed at 02/15/2022 0414 ?Gross per 24 hour  ?Intake --  ?Output 676 ml  ?Net -676 ml  ? ? ?LBM: Last BM Date : 02/14/22 ?Baseline Weight: Weight: 49.4 kg ?Most recent weight: Weight: 47.2 kg ? ?     ?Palliative Assessment/Data: 60% ? ? ? ? ? ?Patient Active Problem List  ? Diagnosis Date Noted  ? Abscess of right lower leg   ? Severe protein-calorie malnutrition (HCC)   ? PVD (peripheral vascular disease) (HCC)   ? Fall   ? Atrial fibrillation (HCC)   ? Goals of care, counseling/discussion   ? Anemia   ? Fluid collection (edema) in the arms, legs, hands and feet   ? Cellulitis of right lower extremity 02/06/2022  ? Chest pain, rule out acute myocardial infarction 03/16/2021  ? HTN (hypertension) 03/16/2021  ? Hypothyroidism 03/16/2021  ? HLD (hyperlipidemia) 03/16/2021  ? Aortic valve stenosis 03/16/2021  ? Chronic venous insufficiency of lower extremity 03/16/2021  ? Short-term memory loss 12/13/2019  ? 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  ? Primary osteoarthritis of right knee 07/16/2016  ? Eosinophilia 06/16/2015  ? Chronic pain disorder   ? Osteoarthritis    ? Incisional umbilical hernia, without obstruction or gangrene   ? Iron deficiency anemia 09/19/2012  ? ? ?Palliative Care Assessment & Plan  ? ?Patient Profile: ?81 y.o. female  with past medical history of cerebellar stroke, iron deficiency anemia, GERD, HTN, hypothyroidism, aortic stenosis, ankylosing spondylitis, venous insufficiency and osteoarthritis admitted on 02/06/2022 with recurrent falls, weakness.  ?  ?Patient admitted for Afib likely secondary to acute cellulitis of the RLE. She does not wish for an aggressive workup of her conditions. PMT has been consulted to assist with goals of care conversation. ? ?Assessment: ?RLE cellulitis with necrotic abscess ?Aortic stenosis ?Afib with RVR ?Goals of care conversation ? ?Recommendations/Plan: ?Continue current care ?Goals remain clear ?PMT will continue to follow incrementally  ? ? ?Prognosis: ? Unable to determine ? ?Discharge Planning: ?Skilled Nursing Facility for rehab with Palliative care service follow-up ? ? ?Total time: ?I spent 25 minutes in the care of the patient today in the above activities and documenting the encounter. ? ? ?Richardson Dopp, PA-C ?Palliative Medicine Team ?Team phone # [phone] ? ?Thank you for allowing the Palliative Medicine Team to assist in the care of this patient. Please utilize secure chat with additional questions, if there is no response within 30 minutes please call the above phone number. ? ?Palliative Medicine Team providers are available by phone from 7am to 7pm daily and can be reached through the team cell phone.  ?Should this patient require assistance outside of these hours, please call the patient's attending physician.  ?

## 2022-02-15 NOTE — Interval H&P Note (Signed)
History and Physical Interval Note: ? ?02/15/2022 ?12:40 PM ? ?Isabel Barnes  has presented today for surgery, with the diagnosis of claudcation.  The various methods of treatment have been discussed with the patient and family. After consideration of risks, benefits and other options for treatment, the patient has consented to  Procedure(s): ?ABDOMINAL AORTOGRAM W/LOWER EXTREMITY (N/A) as a surgical intervention.  The patient's history has been reviewed, patient examined, no change in status, stable for surgery.  I have reviewed the patient's chart and labs.  Questions were answered to the patient's satisfaction.   ? ? ?Lemar Livings ? ? ?

## 2022-02-15 NOTE — Progress Notes (Addendum)
Pt arrived back from cath lab. Left groin level 0, with dry gauze dressing present. Left PT pulse and right DP pulse found with doppler. Vitals set-up for frequent checks, per protocol. Pt educated on restrictions while on bedrest. Pt verbalized understanding.  ?

## 2022-02-15 NOTE — Progress Notes (Signed)
? ?HD#9 ?SUBJECTIVE:  ?Patient Summary: Isabel Barnes is a 81 y.o. with a pertinent PMH of previous cerebellar stroke, venous insufficiency, iron deficiency anemia, GERD, HTN, hypothyroidism, aortic stenosis, and ankylosing spondylitis, who presented with a fall and admitted for Afib likely secondary to acute cellulitis of the RLE ? ?Overnight Events: No acute events overnight.  ? ?Interim History: This is hospital day 9 for this patient. She states that the oxy yesterday is helping but her pain is still pretty bad and going up the leg. Lasts about 3-3.5 hrs before pain gets bad again. Last BM yesterday- no melena or hematochezia.  ? ?OBJECTIVE:  ?Vital Signs: ?Vitals:  ? 02/14/22 1940 02/14/22 2344 02/15/22 0415 02/15/22 0447  ?BP: (!) 143/63 (!) 123/55 (!) 143/80   ?Pulse: 64 (!) 54 (!) 55   ?Resp: _0 ?Temp: (!) 97.5 ?F (36.4 ?C) 97.6 ?F (36.4 ?C) (!) 97.4 ?F (36.3 ?C)   ?TempSrc: Oral Oral Oral   ?SpO2: 100% 99% 99%   ?Weight:    47.2 kg  ?Height:      ? ?Supplemental O2: Room Air ?SpO2: 99 % ?O2 Flow Rate (L/min): 2 L/min ? ?Filed Weights  ? 02/10/22 1336 02/11/22 0500 02/15/22 0447  ?Weight: 49.9 kg 47 kg 47.2 kg  ? ? ? ?Intake/Output Summary (Last 24 hours) at 02/15/2022 0659 ?Last data filed at 02/15/2022 0414 ?Gross per 24 hour  ?Intake --  ?Output 878 ml  ?Net -878 ml  ? ?Net IO Since Admission: 984.56 mL [02/15/22 0659] ? ?Physical Exam: ?General: Pleasant, thin-appearing female laying in bed. No acute distress. ?CV: RRR. IV/VI holosystolic murmur ?Pulmonary: Lungs CTAB. Normal effort. No wheezing or rales. ?Abdominal: Soft, nontender, nondistended. Normal bowel sounds. ?Extremities: RLE wrapped in ace bandage with wound VAC in place. ~350 cc serosanguinous output in VAC.  ?Skin: Warm and dry. Venous stasis dermatitis  ?Neuro: A&Ox3. No focal deficit. ?Psych: Normal mood and affect ? ? ? ? ?ASSESSMENT/PLAN:  ?Assessment: ?Principal Problem: ?  Cellulitis of right lower extremity ?Active  Problems: ?  Aortic valve stenosis ?  Anemia ?  Fluid collection (edema) in the arms, legs, hands and feet ?  Fall ?  Atrial fibrillation ([place]) ?  Goals of care, counseling/discussion ?  Severe protein-calorie malnutrition ([place]) ?  PVD (peripheral vascular disease) ([place]) ?  Abscess of right lower leg ? ? ?Plan: ?#Cellulitis of right lower extremity ?#PAD right lower extremity  ?Patient underwent excisional debridement of right lower leg with ortho on 3/15 and was found to have necrotic soft tissue. Wound VAC was placed at that time with ~350 cc output noted today. Surgical cultures growing E. Faecalis, susceptible to vancomycin. ?- Continue IV vanc, discontinue rocephin  ?- Tylenol 1000 mg tid ?- Oxycodone 5 mg q4h prn for moderate-severe pain ?- IV morphine 2 mg q6h for breakthrough pain ?- Robaxin 500 mg q8h prn for muscle spasms  ? ?#PAD, right lower extremity ?ABIs with noncompressible veins in RLE and abnormal TBI. Plan with aortogram and arteriogram with possible RLE intervention today with vascular surgery. ?- Vascular surgery consulted, appreciate recs ?- Crestor 5 mg daily ? ?#GI bleed, stable ?#Iron deficiency anemia ?#Constipation  ?Last BM was yesterday with no melena/hematochezia. Hb remains stable at 9.3 at this time with no signs of bleeding noted.  ?- Protonix 40 mg bid ?- Daily CBC ?- Senna bid ?- Miralax prn  ? ?#New onset Afib ?Patient remains in NSR. Continue amiodarone 200 mg bid. ?  ?#  Goals of care ?Patient met with the palliative team and noted that her primary concern is her decline in function and loss of independence. Family meeting was held and the plan will be to discharge patient to SNF with outpatient palliative care follow up. TOC has been consulted to assist with SNF placement.  ? ?Best Practice: ?Diet: Regular diet after procedure ?IVF: Fluids: none ?VTE: enoxaparin (LOVENOX) injection 30 mg Start: 02/11/22 1230 ?Code: DNR ?AB: none ?Therapy Recs: SNF ?DISPO: Anticipated discharge   this week  to Skilled nursing facility pending Medical stability and placement . ? ?Signature: ?Buddy Duty, D.O.  ?Internal Medicine Resident, PGY-1 ?[facility]  ?Pager: [phone] ?6:59 AM, 02/15/2022  ? ?Please contact the on call pager after 5 pm and on weekends at [phone]. ? ?

## 2022-02-15 NOTE — Progress Notes (Signed)
PHARMACIST LIPID MONITORING ? ? ?Isabel Barnes is a 81 y.o. female admitted on 02/06/2022 with RLE cellulitis and PAD. Pharmacy has been consulted to optimize lipid-lowering therapy with the indication of secondary prevention for clinical ASCVD. ? ?Recent Labs: ? ?Lipid Panel (last 6 months):   ?Lab Results  ?Component Value Date  ? CHOL 125 02/13/2022  ? TRIG 84 02/13/2022  ? HDL 34 (L) 02/13/2022  ? CHOLHDL 3.7 02/13/2022  ? VLDL 17 02/13/2022  ? LDLCALC 74 02/13/2022  ? ? ?Hepatic function panel (last 6 months):   ?Lab Results  ?Component Value Date  ? AST 24 02/09/2022  ? ALT 20 02/09/2022  ? ALKPHOS 107 02/09/2022  ? BILITOT 0.3 02/09/2022  ? ? ?SCr (since admission):   ?Serum creatinine: 0.64 mg/dL 85/92/92 4462 ?Estimated creatinine clearance: 38.9 mL/min ? ?Current therapy and lipid therapy tolerance ?Current lipid-lowering therapy: rosuvastatin 5mg  daily ?Documented or reported allergies or intolerances to lipid-lowering therapies (if applicable): 02/2021 cards note states "cannot tolerate zetia or statins" so just using low dose rosuvastatin for now ? ?Plan:   ? ?1.Statin intensity (high intensity recommended for all patients regardless of the LDL):  Statin intolerance noted. No statin changes due to serious side effects (ex. Myalgias with at least 2 different statins). ? ? ?03/2021, PharmD, BCPS ?Please see amion for complete clinical pharmacist phone list ?02/15/2022, 2:27 PM ? ?

## 2022-02-15 NOTE — Progress Notes (Signed)
Patient off floor for procedure 

## 2022-02-15 NOTE — Op Note (Signed)
? ? ?  Patient name: Isabel Barnes MRN: [id number] DOB: 06/27/1941 Sex: female ? ?02/15/2022 ?Pre-operative Diagnosis: Right lower extremity wound ?Post-operative diagnosis:  Same ?Surgeon:  Luanna Salk. Randie Heinz, MD ?Procedure Performed: ?1.  Ultrasound-guided cannulation left common femoral artery ?2.  Aortogram with bilateral extremity runoff ?3.  Selection of right SFA and right lower extremity angiogram ?4.  Moderate sedation with fentanyl and Versed for 17 minutes ? ? ?Indications: 81 year old female with right lower extremity wound which has been debrided with a wound VAC in place.  She does not have palpable pulses she has monophasic waveforms on the right.  She is indicated for angiography with possible intervention. ? ?Findings: Aorta and iliac segments are free of flow-limiting stenosis.  Left lower extremity she appears to have inline flow via at least the anterior tibial artery all the way to the level of the ankle.  On the right side the SFA is large popliteal is large both are patent with no flow-limiting stenosis.  The anterior tibial and posterior tibial arteries are occluded the peroneal artery is at least 3 and half millimeters and it provides inline flow to the ankle and at that level gives off a plantar branch to the foot. ? ?No intervention was undertaken as patient has inline flow via peroneal artery and no posterior tibial artery for treatment to help with wound healing. ?  ?Procedure:  The patient was identified in the holding area and taken to room 8.  The patient was then placed supine on the table and prepped and draped in the usual sterile fashion.  A time out was called.  Ultrasound was used to evaluate the left common femoral artery which was noted to be patent and compressible.  There is no signs OF lidocaine cannulated micropuncture needle followed by wire sheath.  An image saved department record.  Concomitantly moderate sedation was administered with fentanyl and Versed and her vital  signs were monitored throughout the case.  Bentson wires placed followed by 5 French sheath and Omni catheter placed to the level of L1 aortogram was performed followed by bilateral extremity runoff.  We then crossed the bifurcation using Omni catheter Bentson wire and placed this into the SFA and performed limited angiography below the knee which demonstrated inline flow via the peroneal artery.  With this the catheter was removed over wire.  Sheath will be pulled postoperative holding.  She tolerated procedure without any complication. ? ?Contrast: 107cc ? ?Caelin Rosen C. Randie Heinz, MD ?[facility] ?Office: [phone] ?Pager: [phone] ? ? ?

## 2022-02-16 ENCOUNTER — Encounter (HOSPITAL_COMMUNITY): Payer: Self-pay | Admitting: Vascular Surgery

## 2022-02-16 DIAGNOSIS — E43 Unspecified severe protein-calorie malnutrition: Secondary | ICD-10-CM | POA: Diagnosis not present

## 2022-02-16 DIAGNOSIS — I480 Paroxysmal atrial fibrillation: Secondary | ICD-10-CM | POA: Diagnosis not present

## 2022-02-16 DIAGNOSIS — I739 Peripheral vascular disease, unspecified: Secondary | ICD-10-CM | POA: Diagnosis not present

## 2022-02-16 DIAGNOSIS — L03115 Cellulitis of right lower limb: Secondary | ICD-10-CM | POA: Diagnosis not present

## 2022-02-16 LAB — AEROBIC/ANAEROBIC CULTURE W GRAM STAIN (SURGICAL/DEEP WOUND)

## 2022-02-16 LAB — CBC
HCT: 31.1 % — ABNORMAL LOW (ref 36.0–46.0)
Hemoglobin: 9.7 g/dL — ABNORMAL LOW (ref 12.0–15.0)
MCH: 26.1 pg (ref 26.0–34.0)
MCHC: 31.2 g/dL (ref 30.0–36.0)
MCV: 83.8 fL (ref 80.0–100.0)
Platelets: 428 10*3/uL — ABNORMAL HIGH (ref 150–400)
RBC: 3.71 MIL/uL — ABNORMAL LOW (ref 3.87–5.11)
RDW: 16.9 % — ABNORMAL HIGH (ref 11.5–15.5)
WBC: 8.3 10*3/uL (ref 4.0–10.5)
nRBC: 0 % (ref 0.0–0.2)

## 2022-02-16 LAB — BASIC METABOLIC PANEL
Anion gap: 4 — ABNORMAL LOW (ref 5–15)
BUN: 16 mg/dL (ref 8–23)
CO2: 27 mmol/L (ref 22–32)
Calcium: 7.6 mg/dL — ABNORMAL LOW (ref 8.9–10.3)
Chloride: 103 mmol/L (ref 98–111)
Creatinine, Ser: 0.84 mg/dL (ref 0.44–1.00)
GFR, Estimated: >60 mL/min (ref >60)
Glucose, Bld: 102 mg/dL — ABNORMAL HIGH (ref 70–99)
Potassium: 4.3 mmol/L (ref 3.5–5.1)
Sodium: 134 mmol/L — ABNORMAL LOW (ref 135–145)

## 2022-02-16 LAB — VANCOMYCIN, PEAK: Vancomycin Pk: 16 ug/mL — ABNORMAL LOW (ref 30–40)

## 2022-02-16 MED ORDER — HYDROCORTISONE 1 % EX OINT
TOPICAL_OINTMENT | Freq: Two times a day (BID) | CUTANEOUS | Status: DC | PRN
  Filled 2022-02-16: qty 28

## 2022-02-16 MED ORDER — NYSTATIN 100000 UNIT/ML MT SUSP
5.0000 mL | Freq: Four times a day (QID) | OROMUCOSAL | Status: DC
  Administered 2022-02-16 – 2022-02-22 (×22): 500000 [IU] via ORAL
  Filled 2022-02-16 (×22): qty 5

## 2022-02-16 MED ORDER — SALINE SPRAY 0.65 % NA SOLN
1.0000 | NASAL | Status: DC | PRN
  Administered 2022-02-16: 1 via NASAL
  Filled 2022-02-16: qty 44

## 2022-02-16 MED ORDER — ASPIRIN EC 81 MG PO TBEC
81.0000 mg | DELAYED_RELEASE_TABLET | Freq: Every day | ORAL | Status: DC
  Administered 2022-02-16 – 2022-02-22 (×7): 81 mg via ORAL
  Filled 2022-02-16 (×7): qty 1

## 2022-02-16 MED ORDER — MORPHINE SULFATE (PF) 2 MG/ML IV SOLN
2.0000 mg | Freq: Four times a day (QID) | INTRAVENOUS | Status: DC | PRN
  Administered 2022-02-16 – 2022-02-19 (×7): 2 mg via INTRAVENOUS
  Filled 2022-02-16 (×7): qty 1

## 2022-02-16 NOTE — Progress Notes (Signed)
Occupational Therapy Treatment ?Patient Details ?Name: Isabel Barnes ?MRN: [id number] ?DOB: 08-19-1941 ?Today's Date: 02/16/2022 ? ? ?History of present illness Pt is a 81 y.o. F who presents 02/06/2022 after a fall. CT head negative for acute abnormality. Hip x-ray negative for acute fracture. Found to have new a-fib in setting of moderate to severe AS. Significant PMH: cerebellar stroke, anemia, HTN, aortic stenosis, ankylosing spondylitis, venous insufficiency. ?  ?OT comments ? Patient received in bed and eager to participate with OT. Patient was able to get to EOB with log rolling technique and min guard assist and increased time due to pain. Patient was mod assist to stand and pivot into recliner with RW.  Patient performed light grooming and peri area cleaning seated and standing from recliner. Patient is motivated and making good progress. Acute OT to continue to follow.   ? ?Recommendations for follow up therapy are one component of a multi-disciplinary discharge planning process, led by the attending physician.  Recommendations may be updated based on patient status, additional functional criteria and insurance authorization. ?   ?Follow Up Recommendations ? Skilled nursing-short term rehab (<3 hours/day)  ?  ?Assistance Recommended at Discharge Frequent or constant Supervision/Assistance  ?Patient can return home with the following ? A little help with walking and/or transfers;A little help with bathing/dressing/bathroom;Assistance with cooking/housework;Direct supervision/assist for medications management;Assist for transportation;Help with stairs or ramp for entrance ?  ?Equipment Recommendations ? Other (comment) (TBD)  ?  ?Recommendations for Other Services   ? ?  ?Precautions / Restrictions Precautions ?Precautions: Fall ?Precaution Comments: wound vac RLE ?Restrictions ?Weight Bearing Restrictions: Yes ?RLE Weight Bearing: Weight bearing as tolerated  ? ? ?  ? ?Mobility Bed Mobility ?Overal bed  mobility: Needs Assistance ?Bed Mobility: Rolling, Sidelying to Sit ?Rolling: Supervision ?Sidelying to sit: Min guard ?  ?  ?  ?General bed mobility comments: used rail to assist, used log rolling technique ?  ? ?Transfers ?Overall transfer level: Needs assistance ?Equipment used: Standard walker ?Transfers: Sit to/from Stand, Bed to chair/wheelchair/BSC ?Sit to Stand: Min assist ?  ?  ?Step pivot transfers: Mod assist ?  ?  ?General transfer comment: min assist to stand from EOB and mod assist for safe transfer to recliner with verbal cues for hand placement ?  ?  ?Balance Overall balance assessment: Needs assistance ?Sitting-balance support: Feet supported, Single extremity supported ?Sitting balance-Leahy Scale: Fair ?Sitting balance - Comments: able to maintain sitting balance on EOB ?  ?Standing balance support: Bilateral upper extremity supported ?Standing balance-Leahy Scale: Poor ?Standing balance comment: Stood for peri area cleaning with min to mod assist for balance ?  ?  ?  ?  ?  ?  ?  ?  ?  ?  ?  ?   ? ?ADL either performed or assessed with clinical judgement  ? ?ADL Overall ADL's : Needs assistance/impaired ?  ?  ?Grooming: Wash/dry hands;Wash/dry face;Set up;Sitting;Brushing hair ?Grooming Details (indicate cue type and reason): performed seated in recliner ?  ?  ?Lower Body Bathing: Moderate assistance;Sit to/from stand ?Lower Body Bathing Details (indicate cue type and reason): bathed peri area while standing ?  ?  ?  ?  ?  ?  ?  ?  ?  ?  ?  ?General ADL Comments: limited by pain ?  ? ?Extremity/Trunk Assessment   ?  ?  ?  ?  ?  ? ?Vision   ?  ?  ?Perception   ?  ?Praxis   ?  ? ?  Cognition Arousal/Alertness: Awake/alert ?Behavior During Therapy: [facility] for tasks assessed/performed ?Overall Cognitive Status: Within Functional Limits for tasks assessed ?Area of Impairment: Memory, Safety/judgement ?  ?  ?  ?  ?  ?  ?  ?  ?  ?  ?Memory: Decreased short-term memory ?  ?Safety/Judgement: Decreased awareness  of safety ?  ?  ?General Comments: Cooperative with therapy, eager to improve ?  ?  ?   ?Exercises   ? ?  ?Shoulder Instructions   ? ? ?  ?General Comments    ? ? ?Pertinent Vitals/ Pain       Pain Assessment ?Pain Assessment: Faces ?Faces Pain Scale: Hurts even more ?Pain Location: R calf/distal to R knee, bilateral hips (L>R) ?Pain Descriptors / Indicators: Shooting, Sharp, Burning, Grimacing ?Pain Intervention(s): Monitored during session, Limited activity within patient's tolerance, Premedicated before session, Repositioned ? ?Home Living   ?  ?  ?  ?  ?  ?  ?  ?  ?  ?  ?  ?  ?  ?  ?  ?  ?  ?  ? ?  ?Prior Functioning/Environment    ?  ?  ?  ?   ? ?Frequency ? Min 2X/week  ? ? ? ? ?  ?Progress Toward Goals ? ?OT Goals(current goals can now be found in the care plan section) ? Progress towards OT goals: Progressing toward goals ? ?Acute Rehab OT Goals ?Patient Stated Goal: get better ?OT Goal Formulation: With patient ?Time For Goal Achievement: 02/21/22 ?Potential to Achieve Goals: Good ?ADL Goals ?Pt Will Perform Grooming: with modified independence;standing ?Pt Will Perform Lower Body Dressing: with modified independence;sit to/from stand ?Pt Will Transfer to Toilet: with modified independence;ambulating ?Additional ADL Goal #1: Pt will indep complete IADLmedication management task  ?Plan Discharge plan remains appropriate   ? ?Co-evaluation ? ? ?   ?  ?  ?  ?  ? ?  ?AM-PAC OT "6 Clicks" Daily Activity     ?Outcome Measure ? ? Help from another person eating meals?: None ?Help from another person taking care of personal grooming?: A Little ?Help from another person toileting, which includes using toliet, bedpan, or urinal?: A Lot ?Help from another person bathing (including washing, rinsing, drying)?: A Lot ?Help from another person to put on and taking off regular upper body clothing?: A Little ?Help from another person to put on and taking off regular lower body clothing?: A Lot ?6 Click Score: 16 ? ?  ?End  of Session Equipment Utilized During Treatment: Rolling walker (2 wheels) ? ?OT Visit Diagnosis: Unsteadiness on feet (R26.81);Other abnormalities of gait and mobility (R26.89);History of falling (Z91.81);Muscle weakness (generalized) (M62.81);Pain ?  ?Activity Tolerance Patient tolerated treatment well;Patient limited by pain ?  ?Patient Left in chair;with call bell/phone within reach;with chair alarm set ?  ?Nurse Communication Mobility status ?  ? ?   ? ?Time: [id number] ?OT Time Calculation (min): 27 min ? ?Charges: OT General Charges ?$OT Visit: 1 Visit ?OT Treatments ?$Self Care/Home Management : 23-37 mins ? ?Lodema Hong, OTA ?Acute Rehabilitation Services  ?Pager [phone] ?Office [phone] ? ? ?[place] ?02/16/2022, 10:24 AM ?

## 2022-02-16 NOTE — Progress Notes (Signed)
[name], female   DOB: 07/14/1941, 80 y.o.   MRN: [id number] ?Patient is a 80-year-old woman who is status post debridement right medial calf necrotic ulcer.  Wound VAC is intact with 400 cc of drainage.  Increased drainage most likely secondary to the arteriogram study.  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  Plan for biologic tissue graft tomorrow. ?

## 2022-02-16 NOTE — H&P (View-Only) (Signed)
[name], female   DOB: 1941/06/02, 81 y.o.   MRN: [id number] ?Patient is a 81 year old woman who is status post debridement right medial calf necrotic ulcer.  Wound VAC is intact with 400 cc of drainage.  Increased drainage most likely secondary to the arteriogram study.  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  Plan for biologic tissue graft tomorrow. ?

## 2022-02-16 NOTE — Progress Notes (Addendum)
? ?HD#10 ?SUBJECTIVE:  ?Patient Summary: Isabel Barnes is a 81 y.o. with a pertinent PMH of previous cerebellar stroke, venous insufficiency, iron deficiency anemia, GERD, HTN, hypothyroidism, aortic stenosis, and ankylosing spondylitis, who presented with a fall and admitted for Afib likely secondary to acute cellulitis of the RLE  ? ?Overnight Events: No acute events overnight ? ?Interim History: This is hospital day 10 for this patient who was seen and evaluated at the bedside this morning. She was seen sitting in bedside recliner "enjoying the peaches," doing well at this moment. She is hopeful for healing and to be out of hospital in a week or less. Her appetite remains stable but she notes that her tongue is raw, feels uncomforatble. Nystatin has helped this issue before.  ? ?OBJECTIVE:  ?Vital Signs: ?Vitals:  ? 02/15/22 1800 02/15/22 1943 02/15/22 2333 02/16/22 0414  ?BP: 135/66 (!) 123/59 111/65 (!) 142/61  ?Pulse: 61 60 62 60  ?Resp:  _0 ?Temp:  98.2 ?F (36.8 ?C) 98.1 ?F (36.7 ?C) 98.1 ?F (36.7 ?C)  ?TempSrc:  Oral Oral Oral  ?SpO2: 100% 98% 95% 98%  ?Weight:    61 kg  ?Height:      ? ?Supplemental O2: Room Air ?SpO2: 98 % ?O2 Flow Rate (L/min): 2 L/min ? ?Filed Weights  ? 02/11/22 0500 02/15/22 0447 02/16/22 0414  ?Weight: 47 kg 47.2 kg 61 kg  ? ? ? ?Intake/Output Summary (Last 24 hours) at 02/16/2022 0646 ?Last data filed at 02/15/2022 2130 ?Gross per 24 hour  ?Intake 300 ml  ?Output 500 ml  ?Net -200 ml  ? ?Net IO Since Admission: 784.56 mL [02/16/22 0646] ? ?Physical Exam: ?General: Pleasant, thin-appearing elderly female sitting in bedside chair. No acute distress. ?HENT: White plaques on tongue noted consistent with thrush  ?CV: RRR. IV/VI holosystolic murmur appreciated.  ?Pulmonary: Lungs CTAB. Normal effort. No wheezing or rales. ?Abdominal: Soft, nontender, nondistended. Normal bowel sounds. ?Extremities: RLE wrapped in ace bandage with wound VAC in place.  ?Skin: Warm and dry. Venous  stasis dermatitis. ?Neuro: A&Ox3. No focal deficit. ?Psych: Normal mood and affect ? ? ? ? ?ASSESSMENT/PLAN:  ?Assessment: ?Principal Problem: ?  Cellulitis of right lower extremity ?Active Problems: ?  Aortic valve stenosis ?  Anemia ?  Fluid collection (edema) in the arms, legs, hands and feet ?  Fall ?  Atrial fibrillation ([place]) ?  Goals of care, counseling/discussion ?  Severe protein-calorie malnutrition ([place]) ?  PVD (peripheral vascular disease) ([place]) ?  Abscess of right lower leg ? ? ?Plan: ?#Cellulitis of right lower extremity ?#PAD of RLE ?Patient underwent excisional debridement with ortho on 3/15 and found to have necrotic soft tissue. Wound VAC placed with ~400 cc serosanguinous output. Surgical cultures grew E. Faecalis, susceptible to Vancomycin. Patient also had aortogram with vascular yesterday which showed occluded anterior tibial and posterior tibial arteries, however, no intervention was undertaken as the patient had inline flow via the peroneal artery. Plan for skin graft tomorrow with ortho. Pain has been better controlled with oxycodone, as opposed to tramadol.  ?- Continue IV vanc ?- Biologic tissue graft with ortho tomorrow ?- Tylenol 1000 mg tid ?- Oxycodone 5 mg q4h prn for moderate-severe pain ?- IV morphine 2 mg q6h for breakthrough pain ?- Robaxin 500 mg q8h prn for muscle spasms  ?- Continue crestor 5 mg daily and aspirin 81 mg daily  ? ?#Thrush  ?Patient has white plaques on tongue and the roof of her mouth feels "  raw".  ?- Nystatin oral suspension qid  ? ?#GI bleed, stable ?#Iron deficiency anemia ?Last BM >1 day ago with no signs of bleeding. Hb stable at 9.7.  ?- Protonix 40 mg bid ?- Daily CBC ?- Senna bid and miralax daily  ? ?#New onset Afib, converted to NSR ?Patient remains in NSR. Continue amiodarone 200 mg bid. ? ?#Severe Malnutrition ?- Ensure protein supplemetns ?- Vitamin C 54m daily ?- Zinc 2271mdaily x14 days ?- MVI  ? ?#Goals of care ?Patient met with the palliative  team and noted that her primary concern is her decline in function and loss of independence. Family meeting was held and the plan will be to discharge patient to SNF with outpatient palliative care follow up. TOC has been consulted to assist with SNF placement.  ? ?Best Practice: ?Diet: Regular diet, NPO at midnight  ?IVF: Fluids: none ?VTE: enoxaparin (LOVENOX) injection 30 mg Start: 02/11/22 1230 ?Code: DNR ?AB: Vanc ?Therapy Recs: SNF ?DISPO: Anticipated discharge in 2-3 days to Skilled nursing facility pending Medical stability and SNF placement . ? ?Signature: ?RaBuddy DutyD.O.  ?Internal Medicine Resident, PGY-1 ?[facility]  ?Pager: #[phone]:46 AM, 02/16/2022  ? ?Please contact the on call pager after 5 pm and on weekends at [phone]? ?

## 2022-02-16 NOTE — Care Management Important Message (Signed)
Important Message ? ?Patient Details  ?Name: Isabel Barnes ?MRN: [id number] ?Date of Birth: [birth year] ? ? ?Medicare Important Message Given:  Yes ? ? ? ? ?Renie Ora ?02/16/2022, 8:14 AM ?

## 2022-02-16 NOTE — Progress Notes (Addendum)
?Progress Note ? ? ? ?02/16/2022 ?7:42 AM ?1 Day Post-Op ? ?Subjective:  says RLE dressing is itching a lot and she feels it is too tight. Otherwise no major complaints ? ? ?Vitals:  ? 02/15/22 2333 02/16/22 0414  ?BP: 111/65 (!) 142/61  ?Pulse: 62 60  ?Resp: 18 16  ?Temp: 98.1 ?F (36.7 ?C) 98.1 ?F (36.7 ?C)  ?SpO2: 95% 98%  ? ?Physical Exam: ?Cardiac:  regular ?Lungs:  non labored ?Incisions:  RLE with wound VAC to suction ?Extremities:  RLE dressed. Left femoral access site c/d/I without swelling or hematoma. 2+ femoral pulses bilaterally ?Abdomen:  flat, soft, non distended ?Neurologic: alert and oriented ? ?CBC ?   ?Component Value Date/Time  ? WBC 8.3 02/16/2022 0220  ? RBC 3.71 (L) 02/16/2022 0220  ? HGB 9.7 (L) 02/16/2022 0220  ? HGB 12.0 02/17/2016 0920  ? HCT 31.1 (L) 02/16/2022 0220  ? HCT 37.1 02/17/2016 0920  ? PLT 428 (H) 02/16/2022 0220  ? PLT 318 02/17/2016 0920  ? MCV 83.8 02/16/2022 0220  ? MCV 88.0 02/17/2016 0920  ? MCH 26.1 02/16/2022 0220  ? MCHC 31.2 02/16/2022 0220  ? RDW 16.9 (H) 02/16/2022 0220  ? RDW 15.0 (H) 02/17/2016 0920  ? LYMPHSABS 0.7 02/06/2022 1239  ? LYMPHSABS 1.6 02/17/2016 0920  ? MONOABS 1.1 (H) 02/06/2022 1239  ? MONOABS 0.6 02/17/2016 0920  ? EOSABS 0.1 02/06/2022 1239  ? EOSABS 0.9 (H) 02/17/2016 0920  ? BASOSABS 0.1 02/06/2022 1239  ? BASOSABS 0.2 (H) 02/17/2016 0920  ? ? ?BMET ?   ?Component Value Date/Time  ? NA 134 (L) 02/16/2022 0220  ? NA 142 02/17/2016 0920  ? K 4.3 02/16/2022 0220  ? K 4.2 02/17/2016 0920  ? CL 103 02/16/2022 0220  ? CO2 27 02/16/2022 0220  ? CO2 26 02/17/2016 0920  ? GLUCOSE 102 (H) 02/16/2022 0220  ? GLUCOSE 93 02/17/2016 0920  ? BUN 16 02/16/2022 0220  ? BUN 13.3 02/17/2016 0920  ? CREATININE 0.84 02/16/2022 0220  ? CREATININE 0.7 02/17/2016 0920  ? CALCIUM 7.6 (L) 02/16/2022 0220  ? CALCIUM 8.8 02/17/2016 0920  ? GFRNONAA >60 02/16/2022 0220  ? GFRAA >60 06/25/2017 1506  ? ? ?INR ?   ?Component Value Date/Time  ? INR 1.3 (H) 02/09/2022 0133   ? ? ? ?Intake/Output Summary (Last 24 hours) at 02/16/2022 0742 ?Last data filed at 02/16/2022 [phone] ?Gross per 24 hour  ?Intake 450 ml  ?Output 1150 ml  ?Net -700 ml  ? ? ? ?Assessment/Plan:  81 y.o. female is s/p Aortogram, BLE arteriogram 1 Day Post-Op  ? ?No intervention performed. Has inline flow via peroneal artery ?Left femoral access site c/d/I without swelling or hematoma ?May have difficult time healing her RLE wound given single vessel peroneal flow and location of wound ?Continue wound care per Dr. Sharol Given ?Would recommend continuing statin and Aspirin ? ? ?Karoline Caldwell, PA-C ?[facility] ?[phone] ?02/16/2022 ?7:42 AM ? ?VASCULAR STAFF ADDENDUM: ?I have independently interviewed and examined the patient. ?I agree with the above.  ?Angiogram by Dr. Donzetta Matters personally reviewed ?Brisk flow to the leg through peroneal only runoff.  The peroneal artery is very large. ?It appears she has enough flow to heal any wound on her foot. ?Nutrition may be an issue going forward (albumin 1.5). ?We will defer further care to Dr. Sharol Given. ?Please call for questions. ? ?Yevonne Aline. Stanford Breed, MD ?[facility] [place] ?Office Phone Number: [phone] ?02/16/2022  10:09 AM ? ? ?

## 2022-02-17 ENCOUNTER — Other Ambulatory Visit: Payer: Self-pay

## 2022-02-17 ENCOUNTER — Encounter (HOSPITAL_COMMUNITY): Payer: Self-pay | Admitting: Internal Medicine

## 2022-02-17 ENCOUNTER — Inpatient Hospital Stay (HOSPITAL_COMMUNITY): Payer: Medicare Other | Admitting: Certified Registered Nurse Anesthetist

## 2022-02-17 ENCOUNTER — Encounter (HOSPITAL_COMMUNITY)
Admission: EM | Disposition: A | Discharge status: Skilled Nursing Facility | Payer: Self-pay | Source: Home / Self Care | Attending: Internal Medicine

## 2022-02-17 DIAGNOSIS — S81801A Unspecified open wound, right lower leg, initial encounter: Secondary | ICD-10-CM

## 2022-02-17 DIAGNOSIS — L03115 Cellulitis of right lower limb: Secondary | ICD-10-CM | POA: Diagnosis not present

## 2022-02-17 DIAGNOSIS — I350 Nonrheumatic aortic (valve) stenosis: Secondary | ICD-10-CM

## 2022-02-17 DIAGNOSIS — I272 Pulmonary hypertension, unspecified: Secondary | ICD-10-CM

## 2022-02-17 DIAGNOSIS — I10 Essential (primary) hypertension: Secondary | ICD-10-CM

## 2022-02-17 HISTORY — PX: SKIN SPLIT GRAFT: SHX444

## 2022-02-17 LAB — CBC
HCT: 28.3 % — ABNORMAL LOW (ref 36.0–46.0)
Hemoglobin: 8.9 g/dL — ABNORMAL LOW (ref 12.0–15.0)
MCH: 26.2 pg (ref 26.0–34.0)
MCHC: 31.4 g/dL (ref 30.0–36.0)
MCV: 83.2 fL (ref 80.0–100.0)
Platelets: 413 10*3/uL — ABNORMAL HIGH (ref 150–400)
RBC: 3.4 MIL/uL — ABNORMAL LOW (ref 3.87–5.11)
RDW: 17.3 % — ABNORMAL HIGH (ref 11.5–15.5)
WBC: 7.9 10*3/uL (ref 4.0–10.5)
nRBC: 0 % (ref 0.0–0.2)

## 2022-02-17 LAB — VANCOMYCIN, RANDOM: Vancomycin Rm: 11

## 2022-02-17 SURGERY — APPLICATION, GRAFT, SKIN, SPLIT-THICKNESS
Anesthesia: General | Site: Leg Lower | Laterality: Right

## 2022-02-17 MED ORDER — VANCOMYCIN HCL IN DEXTROSE 1-5 GM/200ML-% IV SOLN: 1000.0000 mg | INTRAVENOUS | Status: DC

## 2022-02-17 MED ORDER — PROPOFOL 10 MG/ML IV BOLUS
INTRAVENOUS | Status: DC | PRN
  Administered 2022-02-17: 30 mg via INTRAVENOUS
  Administered 2022-02-17: 60 mg via INTRAVENOUS
  Administered 2022-02-17: 20 mg via INTRAVENOUS

## 2022-02-17 MED ORDER — OXYCODONE HCL 5 MG/5ML PO SOLN: 5.0000 mg | Freq: Once | ORAL | Status: DC | PRN

## 2022-02-17 MED ORDER — ONDANSETRON HCL 4 MG/2ML IJ SOLN
4.0000 mg | Freq: Four times a day (QID) | INTRAMUSCULAR | Status: DC | PRN
Start: 2022-02-17 — End: 2022-02-22
  Administered 2022-02-18 – 2022-02-21 (×3): 4 mg via INTRAVENOUS
  Filled 2022-02-17 (×3): qty 2

## 2022-02-17 MED ORDER — SUCCINYLCHOLINE CHLORIDE 200 MG/10ML IV SOSY
PREFILLED_SYRINGE | INTRAVENOUS | Status: DC | PRN
  Administered 2022-02-17: 60 mg via INTRAVENOUS

## 2022-02-17 MED ORDER — BISACODYL 10 MG RE SUPP: 10.0000 mg | Freq: Every day | RECTAL | Status: DC | PRN

## 2022-02-17 MED ORDER — FENTANYL CITRATE (PF) 100 MCG/2ML IJ SOLN
INTRAMUSCULAR | Status: AC
  Filled 2022-02-17: qty 2

## 2022-02-17 MED ORDER — METHOCARBAMOL 1000 MG/10ML IJ SOLN
500.0000 mg | Freq: Four times a day (QID) | INTRAVENOUS | Status: DC | PRN
  Filled 2022-02-17: qty 5

## 2022-02-17 MED ORDER — METOCLOPRAMIDE HCL 5 MG PO TABS: 5.0000 mg | ORAL_TABLET | Freq: Three times a day (TID) | ORAL | Status: DC | PRN

## 2022-02-17 MED ORDER — FENTANYL CITRATE (PF) 250 MCG/5ML IJ SOLN
INTRAMUSCULAR | Status: DC | PRN
  Administered 2022-02-17: 50 ug via INTRAVENOUS

## 2022-02-17 MED ORDER — CHLORHEXIDINE GLUCONATE 0.12 % MT SOLN: 15.0000 mL | Freq: Once | OROMUCOSAL | Status: AC

## 2022-02-17 MED ORDER — PHENYLEPHRINE HCL (PRESSORS) 10 MG/ML IV SOLN
INTRAVENOUS | Status: DC | PRN
  Administered 2022-02-17: 40 ug via INTRAVENOUS

## 2022-02-17 MED ORDER — OXYCODONE HCL 5 MG PO TABS: 5.0000 mg | ORAL_TABLET | Freq: Once | ORAL | Status: DC | PRN

## 2022-02-17 MED ORDER — ORAL CARE MOUTH RINSE: 15.0000 mL | Freq: Once | OROMUCOSAL | Status: AC

## 2022-02-17 MED ORDER — CHLORHEXIDINE GLUCONATE 4 % EX LIQD: 60.0000 mL | Freq: Once | CUTANEOUS | Status: DC

## 2022-02-17 MED ORDER — LIDOCAINE 2% (20 MG/ML) 5 ML SYRINGE
INTRAMUSCULAR | Status: AC
  Filled 2022-02-17: qty 5

## 2022-02-17 MED ORDER — METOCLOPRAMIDE HCL 5 MG/ML IJ SOLN: 5.0000 mg | Freq: Three times a day (TID) | INTRAMUSCULAR | Status: DC | PRN

## 2022-02-17 MED ORDER — 0.9 % SODIUM CHLORIDE (POUR BTL) OPTIME
TOPICAL | Status: DC | PRN
  Administered 2022-02-17: 1000 mL

## 2022-02-17 MED ORDER — SODIUM CHLORIDE 0.9 % IV SOLN: INTRAVENOUS | Status: DC

## 2022-02-17 MED ORDER — METRONIDAZOLE 500 MG PO TABS
500.0000 mg | ORAL_TABLET | Freq: Two times a day (BID) | ORAL | Status: DC
  Administered 2022-02-17 – 2022-02-22 (×10): 500 mg via ORAL
  Filled 2022-02-17 (×11): qty 1

## 2022-02-17 MED ORDER — FENTANYL CITRATE (PF) 250 MCG/5ML IJ SOLN
INTRAMUSCULAR | Status: AC
  Filled 2022-02-17: qty 5

## 2022-02-17 MED ORDER — LIDOCAINE 2% (20 MG/ML) 5 ML SYRINGE
INTRAMUSCULAR | Status: DC | PRN
  Administered 2022-02-17: 40 mg via INTRAVENOUS

## 2022-02-17 MED ORDER — CHLORHEXIDINE GLUCONATE 0.12 % MT SOLN
OROMUCOSAL | Status: AC
  Administered 2022-02-17: 15 mL via OROMUCOSAL
  Filled 2022-02-17: qty 15

## 2022-02-17 MED ORDER — SUCCINYLCHOLINE CHLORIDE 200 MG/10ML IV SOSY
PREFILLED_SYRINGE | INTRAVENOUS | Status: AC
  Filled 2022-02-17: qty 10

## 2022-02-17 MED ORDER — LACTATED RINGERS IV SOLN: INTRAVENOUS | Status: DC

## 2022-02-17 MED ORDER — ONDANSETRON HCL 4 MG/2ML IJ SOLN
INTRAMUSCULAR | Status: DC | PRN
  Administered 2022-02-17: 4 mg via INTRAVENOUS

## 2022-02-17 MED ORDER — PHENYLEPHRINE HCL-NACL 20-0.9 MG/250ML-% IV SOLN
INTRAVENOUS | Status: DC | PRN
  Administered 2022-02-17: 40 ug/min via INTRAVENOUS

## 2022-02-17 MED ORDER — POLYETHYLENE GLYCOL 3350 17 G PO PACK: 17.0000 g | PACK | Freq: Every day | ORAL | Status: DC | PRN

## 2022-02-17 MED ORDER — FENTANYL CITRATE (PF) 100 MCG/2ML IJ SOLN
INTRAMUSCULAR | Status: DC | PRN
Start: 2022-02-17 — End: 2022-02-17

## 2022-02-17 MED ORDER — VANCOMYCIN HCL IN DEXTROSE 1-5 GM/200ML-% IV SOLN
INTRAVENOUS | Status: AC
  Administered 2022-02-17: 1000 mg via INTRAVENOUS
  Filled 2022-02-17: qty 200

## 2022-02-17 MED ORDER — FENTANYL CITRATE (PF) 100 MCG/2ML IJ SOLN
25.0000 ug | INTRAMUSCULAR | Status: DC | PRN
  Administered 2022-02-17 (×4): 25 ug via INTRAVENOUS

## 2022-02-17 MED ORDER — FENTANYL CITRATE (PF) 100 MCG/2ML IJ SOLN: 25.0000 ug | INTRAMUSCULAR | Status: DC | PRN

## 2022-02-17 MED ORDER — AMISULPRIDE (ANTIEMETIC) 5 MG/2ML IV SOLN: 10.0000 mg | Freq: Once | INTRAVENOUS | Status: DC | PRN

## 2022-02-17 MED ORDER — PHENYLEPHRINE 40 MCG/ML (10ML) SYRINGE FOR IV PUSH (FOR BLOOD PRESSURE SUPPORT)
PREFILLED_SYRINGE | INTRAVENOUS | Status: AC
  Filled 2022-02-17: qty 10

## 2022-02-17 MED ORDER — METHOCARBAMOL 500 MG PO TABS
500.0000 mg | ORAL_TABLET | Freq: Four times a day (QID) | ORAL | Status: DC | PRN
  Administered 2022-02-17 – 2022-02-22 (×10): 500 mg via ORAL
  Filled 2022-02-17 (×10): qty 1

## 2022-02-17 MED ORDER — METRONIDAZOLE 500 MG PO TABS
500.0000 mg | ORAL_TABLET | Freq: Three times a day (TID) | ORAL | Status: DC
  Filled 2022-02-17: qty 1

## 2022-02-17 MED ORDER — DOCUSATE SODIUM 100 MG PO CAPS
100.0000 mg | ORAL_CAPSULE | Freq: Two times a day (BID) | ORAL | Status: DC
  Administered 2022-02-17 – 2022-02-22 (×10): 100 mg via ORAL
  Filled 2022-02-17 (×10): qty 1

## 2022-02-17 MED ORDER — ONDANSETRON HCL 4 MG/2ML IJ SOLN: 4.0000 mg | Freq: Once | INTRAMUSCULAR | Status: DC | PRN

## 2022-02-17 MED ORDER — ACETAMINOPHEN 500 MG PO TABS: 1000.0000 mg | ORAL_TABLET | Freq: Once | ORAL | Status: DC

## 2022-02-17 MED ORDER — ONDANSETRON HCL 4 MG PO TABS: 4.0000 mg | ORAL_TABLET | Freq: Four times a day (QID) | ORAL | Status: DC | PRN

## 2022-02-17 SURGICAL SUPPLY — 50 items
BAG COUNTER SPONGE SURGICOUNT (BAG) ×2 IMPLANT
BAG SPNG CNTER NS LX DISP (BAG) ×1
BNDG CMPR 9X4 STRL LF SNTH (GAUZE/BANDAGES/DRESSINGS)
BNDG COHESIVE 6X5 TAN NS LF (GAUZE/BANDAGES/DRESSINGS) ×1 IMPLANT
BNDG COHESIVE 6X5 TAN STRL LF (GAUZE/BANDAGES/DRESSINGS) ×1 IMPLANT
BNDG ESMARK 4X9 LF (GAUZE/BANDAGES/DRESSINGS) ×1 IMPLANT
BNDG GAUZE ELAST 4 BULKY (GAUZE/BANDAGES/DRESSINGS) IMPLANT
CANISTER WOUND CARE 500ML ATS (WOUND CARE) ×1 IMPLANT
COVER SURGICAL LIGHT HANDLE (MISCELLANEOUS) ×4 IMPLANT
CUFF TOURN SGL QUICK 18X4 (TOURNIQUET CUFF) IMPLANT
CUFF TOURN SGL QUICK 24 (TOURNIQUET CUFF)
CUFF TRNQT CYL 24X4X16.5-23 (TOURNIQUET CUFF) IMPLANT
DERMACARRIERS GRAFT 1 TO 1.5 (DISPOSABLE)
DRAPE DERMATAC (DRAPES) ×1 IMPLANT
DRAPE U-SHAPE 47X51 STRL (DRAPES) ×2 IMPLANT
DRESSING VERAFLO CLEANS CC MED (GAUZE/BANDAGES/DRESSINGS) IMPLANT
DRSG ADAPTIC 3X8 NADH LF (GAUZE/BANDAGES/DRESSINGS) IMPLANT
DRSG MEPITEL 4X7.2 (GAUZE/BANDAGES/DRESSINGS) ×1 IMPLANT
DRSG VERAFLO CLEANSE CC MED (GAUZE/BANDAGES/DRESSINGS) ×2
ELECT REM PT RETURN 9FT ADLT (ELECTROSURGICAL) ×2
ELECTRODE REM PT RTRN 9FT ADLT (ELECTROSURGICAL) ×1 IMPLANT
GAUZE SPONGE 4X4 12PLY STRL (GAUZE/BANDAGES/DRESSINGS) IMPLANT
GLOVE SURG ORTHO LTX SZ9 (GLOVE) ×1 IMPLANT
GLOVE SURG UNDER POLY LF SZ9 (GLOVE) ×2 IMPLANT
GOWN STRL REUS W/ TWL XL LVL3 (GOWN DISPOSABLE) ×2 IMPLANT
GOWN STRL REUS W/TWL XL LVL3 (GOWN DISPOSABLE) ×2
GRAFT DERMACARRIERS 1 TO 1.5 (DISPOSABLE) IMPLANT
GRAFT SKIN WND OMEGA3 7X10 (Tissue) ×4 IMPLANT
GRAFT SKN 7X10XSTRL LF DISP (Tissue) IMPLANT
KIT BASIN OR (CUSTOM PROCEDURE TRAY) ×2 IMPLANT
KIT TURNOVER KIT B (KITS) ×2 IMPLANT
MANIFOLD NEPTUNE II (INSTRUMENTS) ×2 IMPLANT
NDL HYPO 25GX1X1/2 BEV (NEEDLE) IMPLANT
NEEDLE HYPO 25GX1X1/2 BEV (NEEDLE) IMPLANT
NS IRRIG 1000ML POUR BTL (IV SOLUTION) ×2 IMPLANT
PACK ORTHO EXTREMITY (CUSTOM PROCEDURE TRAY) ×2 IMPLANT
PAD ARMBOARD 7.5X6 YLW CONV (MISCELLANEOUS) ×2 IMPLANT
PAD CAST 4YDX4 CTTN HI CHSV (CAST SUPPLIES) IMPLANT
PAD NEG PRESSURE SENSATRAC (MISCELLANEOUS) ×1 IMPLANT
PADDING CAST COTTON 4X4 STRL (CAST SUPPLIES)
STAPLER VISISTAT 35W (STAPLE) ×1 IMPLANT
SUCTION FRAZIER HANDLE 10FR (MISCELLANEOUS)
SUCTION TUBE FRAZIER 10FR DISP (MISCELLANEOUS) IMPLANT
SUT ETHILON 4 0 PS 2 18 (SUTURE) IMPLANT
SYR CONTROL 10ML LL (SYRINGE) IMPLANT
TOWEL GREEN STERILE (TOWEL DISPOSABLE) ×1 IMPLANT
TOWEL GREEN STERILE FF (TOWEL DISPOSABLE) ×2 IMPLANT
TUBE CONNECTING 12X1/4 (SUCTIONS) IMPLANT
WATER STERILE IRR 1000ML POUR (IV SOLUTION) ×1 IMPLANT
YANKAUER SUCT BULB TIP NO VENT (SUCTIONS) ×1 IMPLANT

## 2022-02-17 NOTE — Progress Notes (Signed)
? ?HD#11 ?SUBJECTIVE:  ?Patient Summary: Isabel Barnes is a 81 y.o. with a pertinent PMH of previous cerebellar stroke, venous insufficiency, iron deficiency anemia, GERD, HTN, hypothyroidism, aortic stenosis, and ankylosing spondylitis, who presented with a fall and admitted for Afib likely secondary to acute cellulitis of the RLE  ? ?Overnight Events: No acute events overnight ? ?Interim History: This is hospital day 11 for this patient. Became nauseous early this morning but she has had no vomiting. Still in pain, thinks it may be a little worse today. Was able to get a little sleep last night. No BM yesterday.  ? ?OBJECTIVE:  ?Vital Signs: ?Vitals:  ? 02/16/22 2036 02/17/22 0018 02/17/22 0447 02/17/22 0452  ?BP: 135/66 (!) 125/104 (!) 118/57   ?Pulse: 63 (!) 59 (!) 59   ?Resp: _0 ?Temp: 97.7 ?F (36.5 ?C) 97.9 ?F (36.6 ?C) 98.5 ?F (36.9 ?C)   ?TempSrc: Oral Oral Oral   ?SpO2: 99% 97% 100%   ?Weight:    53.4 kg  ?Height:      ? ?Supplemental O2: Room Air ?SpO2: 100 % ?O2 Flow Rate (L/min): 2 L/min ? ?Filed Weights  ? 02/15/22 0447 02/16/22 0414 02/17/22 0452  ?Weight: 47.2 kg 61 kg 53.4 kg  ? ? ? ?Intake/Output Summary (Last 24 hours) at 02/17/2022 0710 ?Last data filed at 02/16/2022 2040 ?Gross per 24 hour  ?Intake 120 ml  ?Output 275 ml  ?Net -155 ml  ? ?Net IO Since Admission: 129.56 mL [02/17/22 0710] ? ?Physical Exam: ?General: Pleasant, thin-appearing female laying in bed. No acute distress but appears uncomfortable. ?CV: RRR. IV/VI holosystolic murmur.  ?Pulmonary: Lungs CTAB. Normal effort. No wheezing or rales. ?Abdominal: Soft, nontender, nondistended. Normal bowel sounds. ?Extremities: RLE wrapped in ace bandage with wound VAC with~400 cc seronsanguinous output.  ?Skin: Warm and dry. Venous stasis dermatitis.  ?Neuro: A&Ox3. No focal deficit. ?Psych: Normal mood and affect ? ? ? ?ASSESSMENT/PLAN:  ?Assessment: ?Principal Problem: ?  Cellulitis of right lower extremity ?Active Problems: ?   Aortic valve stenosis ?  Anemia ?  Fluid collection (edema) in the arms, legs, hands and feet ?  Fall ?  Atrial fibrillation ([place]) ?  Goals of care, counseling/discussion ?  Severe protein-calorie malnutrition ([place]) ?  PVD (peripheral vascular disease) ([place]) ?  Abscess of right lower leg ? ? ?Plan: ?#Cellulitis of right lower extremity ?#PAD of RLE ?Patient underwent excisional debridement with ortho on 3/15 and found to have necrotic soft tissue. Wound VAC placed with ~400 cc serosanguinous output. Surgical cultures grew E. Faecalis and vancomycin was continued. Plan for biologic tissue graft with ortho this afternoon.   ?- Continue IV vanc ?- Tylenol 1000 mg tid ?- Oxycodone 5 mg q4h prn for moderate-severe pain ?- IV morphine 2 mg q6h for breakthrough pain ?- Robaxin 500 mg q8h prn for muscle spasms  ?- Continue crestor 5 mg daily and aspirin 81 mg daily  ? ?#Thrush  ?- Continue Nystatin oral suspension qid ? ?#GI bleed, stable ?#Iron deficiency anemia ?Last BM was about 2 days ago with no signs of bleeding. Hb slightly dropped today to 8.9 from 9.7.  ?- Protonix 40 mg bid ?- Daily CBC ?- Senna bid and miralax daily  ?  ?#New onset Afib, converted to NSR ?Patient remains in NSR. Continue amiodarone 200 mg bid. ? ?#Severe Malnutrition ?Patient has severe malnutrition and has an albumin of 1.5.  ?- Ensure protein supplements ?- Vitamin C 500 mg daily ?-  Zinc 200 mg daily, day 6/14  ?- Multivitamin  ? ?#Goals of care ?Patient met with the palliative team and noted that her primary concern is her decline in function and loss of independence. Family meeting was held and the plan will be to discharge patient to SNF with outpatient palliative care follow up. TOC has been consulted to assist with SNF placement. Will continue to reach out to social work regarding this process.  ? ?Best Practice: ?Diet: NPO, Regular diet after procedure  ?IVF: Fluids: none ?VTE: Held ?Code: DNR ?AB: Vancomycin ?Therapy Recs: SNF ?DISPO:  Anticipated discharge in 1-3 days to Skilled nursing facility pending Medical stability and placement . ? ?Signature: ?Buddy Duty, D.O.  ?Internal Medicine Resident, PGY-1 ?[facility]  ?Pager: [phone] ?7:10 AM, 02/17/2022  ? ?Please contact the on call pager after 5 pm and on weekends at [phone]. ? ?

## 2022-02-17 NOTE — Progress Notes (Signed)
PT Cancellation Note ? ?Patient Details ?Name: Isabel Barnes ?MRN: [id number] ?DOB: Feb 05, 1941 ? ? ?Cancelled Treatment:    Reason Eval/Treat Not Completed: Patient at procedure or test/unavailable. Will plan to follow-up later as time permits. ? ? ?Raymond Gurney, PT, DPT ?Acute Rehabilitation Services  ?Pager: [phone] ?Office: [phone] ? ? ? ?[name] ?02/17/2022, 2:34 PM ? ? ?

## 2022-02-17 NOTE — TOC Progression Note (Addendum)
Transition of Care (TOC) - Progression Note  ? ? ?Patient Details  ?Name: Isabel Barnes ?MRN: [id number] ?Date of Birth: 11-Oct-1941 ? ?Transition of Care (TOC) CM/SW Contact  ?Vinie Sill, LCSW ?Phone Number: ?02/17/2022, 2:06 PM ? ?Clinical Narrative:    ? ?CSW met with patient at bedside. Patient states she has not made a SNF choice at this time. CSW advised has not been able to reach her son- she reports "because he has been traveling". CSW was given per mission to contact her daughter. CSW spoke with Margarita Grizzle and gave her the SNF bed offers. She advised, she will email patient's son, [name], bed offers, he is expected to arrive her tomorrow and he can assist with facility choice. CSW provided contact information for follow up with SNF choice. ? ?TOC will continue to follow and assist with planning.     ? ?Thurmond Butts, MSW, LCSW ?Clinical Social Worker ? ? ? ?Expected Discharge Plan: [place] ?Barriers to Discharge: Ship broker, Continued Medical Work up, SNF Pending bed offer ? ?Expected Discharge Plan and Services ?Expected Discharge Plan: [place] ?In-house Referral: Clinical Social Work ?  ?  ?Living arrangements for the past 2 months: [place] ?                ?  ?  ?  ?  ?  ?  ?  ?  ?  ?  ? ? ?Social Determinants of Health (SDOH) Interventions ?  ? ?Readmission Risk Interventions ?   ? View : No data to display.  ?  ?  ?  ? ? ?

## 2022-02-17 NOTE — Transfer of Care (Signed)
Immediate Anesthesia Transfer of Care Note ? ?Patient: Isabel Barnes ? ?Procedure(s) Performed: RIGHT LEG SKIN GRAFT (Right: Leg Lower) ? ?Patient Location: PACU ? ?Anesthesia Type:General ? ?Level of Consciousness: awake and drowsy ? ?Airway & Oxygen Therapy: Patient Spontanous Breathing and Patient connected to nasal cannula oxygen ? ?Post-op Assessment: Report given to RN and Post -op Vital signs reviewed and stable ? ?Post vital signs: Reviewed and stable ? ?Last Vitals:  ?Vitals Value Taken Time  ?BP    ?Temp    ?Pulse 55 02/17/22 1444  ?Resp    ?SpO2 95 % 02/17/22 1444  ?Vitals shown include unvalidated device data. ? ?Last Pain:  ?Vitals:  ? 02/17/22 1234  ?TempSrc:   ?PainSc: 7   ?   ? ?Patients Stated Pain Goal: 0 (02/17/22 0443) ? ?Complications: No notable events documented. ?

## 2022-02-17 NOTE — Interval H&P Note (Signed)
History and Physical Interval Note: ? ?02/17/2022 ?7:20 AM ? ?[name]  has presented today for surgery, with the diagnosis of Would Left Leg.  The various methods of treatment have been discussed with the patient and family. After consideration of risks, benefits and other options for treatment, the patient has consented to  Procedure(s): ?LEFT LEG SKIN GRAFT (Left) as a surgical intervention.  The patient's history has been reviewed, patient examined, no change in status, stable for surgery.  I have reviewed the patient's chart and labs.  Questions were answered to the patient's satisfaction.   ? ? ?Isabel Barnes ? ? ?

## 2022-02-17 NOTE — Op Note (Addendum)
02/17/2022 ? ?2:45 PM ? ?PATIENT:  Isabel Barnes   ? ?PRE-OPERATIVE DIAGNOSIS:  Wound Right lower Leg ? ?POST-OPERATIVE DIAGNOSIS:  Same ? ?PROCEDURE:  RIGHT LEG SKIN GRAFT ?Application of Kerecis 7 x 10 cm graft x2. ?Excisional debridement of skin and soft tissue muscle and fascia. ? ?SURGEON:  Newt Minion, MD ? ?PHYSICIAN ASSISTANT:None ?ANESTHESIA:   General ? ?PREOPERATIVE INDICATIONS:  Isabel Barnes is a  81 y.o. female with a diagnosis of Wound Right lower Leg who failed conservative measures and elected for surgical management.   ? ?The risks benefits and alternatives were discussed with the patient preoperatively including but not limited to the risks of infection, bleeding, nerve injury, cardiopulmonary complications, the need for revision surgery, among others, and the patient was willing to proceed. ? ?OPERATIVE IMPLANTS: Kerecis skin graft 7 x 10 cm x 2. ?Cleanse choice wound VAC sponge x1. ? ?@ENCIMAGES @ ? ?OPERATIVE FINDINGS: Patient had a good healthy wound bed after debridement.  After debridement the wound bed was 19 x 10 cm. ? ?OPERATIVE PROCEDURE: Patient was brought the operating room and underwent a general anesthetic.  After adequate levels anesthesia were obtained patient's right lower extremity was prepped using DuraPrep draped into a sterile field a timeout was called.  Elliptical incision was made around the wound back to healthy viable tissue along the wound edges.  A rondure and a 21 blade knife was used to further debride the wound bed back to healthy bleeding viable tissue.  The wound was irrigated normal saline.  Electrocautery was used for hemostasis.  2 of the 7 x 10 cm graft was applied secured with staples the Praveena cleanse choice VAC sponge was applied secured with derma tack this had a good suction fit patient was extubated taken the PACU in stable condition. ? ?Debridement type: Excisional Debridement ? ?Side: right ? ?Body Location: leg  ? ?Tools used for  debridement: scalpel and rongeur ? ?Pre-debridement Wound size (cm):   Length: 15        Width: 5     Depth: 1  ? ?Post-debridement Wound size (cm):   Length: 19        Width: 10     Depth: 1  ? ?Debridement depth beyond dead/damaged tissue down to healthy viable tissue: yes ? ?Tissue layer involved: skin, subcutaneous tissue, muscle / fascia ? ?Nature of tissue removed: Devitalized Tissue and Non-viable tissue ? ?Irrigation volume: 2 liters    ? ?Irrigation fluid type: Normal Saline ? ? ? ?DISCHARGE PLANNING: ? ?Antibiotic duration: Continue IV vancomycin ? ?Weightbearing: Weightbearing as tolerated ? ?Pain medication: Continue low-dose opioid pathway ? ?Dressing care/ Wound VAC: Continue wound VAC for 1 week ? ?Ambulatory devices: Walker ? ?Discharge to: Discharge planning based on therapy recommendations. ? ?Follow-up: In the office 1 week post operative. ? ? ? ? ? ? ?  ?

## 2022-02-17 NOTE — Anesthesia Preprocedure Evaluation (Addendum)
Anesthesia Evaluation  ?Patient identified by MRN, date of birth, ID band ?Patient awake ? ? ? ?Reviewed: ?Allergy & Precautions, H&P , NPO status , Patient's Chart, lab work & pertinent test results ? ?History of Anesthesia Complications ?(+) POST - OP SPINAL HEADACHE and history of anesthetic complications ? ?Airway ?Mallampati: II ? ?TM Distance: >3 FB ?Neck ROM: Full ? ? ? Dental ?no notable dental hx. ?(+) Dental Advisory Given, Teeth Intact ?  ?Pulmonary ?shortness of breath, pneumonia, resolved, former smoker,  ?  ?Pulmonary exam normal ?breath sounds clear to auscultation ? ? ? ? ? ? Cardiovascular ?hypertension, Pt. on medications ?pulmonary hypertension+ Peripheral Vascular Disease  ?+ Valvular Problems/Murmurs AS  ?Rhythm:Regular Rate:Normal ?+ Systolic murmurs ?Echo  ?1. Left ventricular ejection fraction, by estimation, is 60 to 65%. The left ventricle has normal function. The left ventricle has no regional wall motion abnormalities. Left ventricular diastolic parameters are consistent with Grade II diastolic dysfunction (pseudonormalization). Elevated left ventricular end-diastolic pressure.  ??2. Right ventricular systolic function is normal. The right ventricular size is normal. There is mildly elevated pulmonary artery systolic pressure. The estimated right ventricular systolic pressure is 99991111 mmHg.  ??3. Left atrial size was moderately dilated.  ??4. The pericardial effusion is anterior to the right ventricle and surrounding the apex. Moderate pleural effusion in the left lateral region.  ??5. The mitral valve is normal in structure. Mild mitral valve  ?regurgitation. No evidence of mitral stenosis. Severe mitral annular calcification.  ??6. The aortic valve is normal in structure. There is severe calcifcation of the aortic valve. There is severe thickening of the aortic valve. Aortic valve regurgitation is not visualized. Moderate aortic valve stenosis. Aortic  valve area, by VTI measures 1.67 cm?Marland Kitchen Aortic valve mean gradient measures 24.0 mmHg. Aortic valve Vmax measures 3.30 m/s.  ??7. The inferior vena cava is dilated in size with >50% respiratory variability, suggesting right atrial pressure of 8 mmHg.  ??8. Compared to echo dated 03/17/2021, the mean AVG has decreased from 18mmHg to 34mmHg, AV VTi has decreased from 3.65m/s to 3.3 m/s and DVI has  ?increased from 0.28 to 0.44.  ?  ?Neuro/Psych ? Headaches, PSYCHIATRIC DISORDERS Depression   ? GI/Hepatic ?Neg liver ROS, GERD  Medicated,  ?Endo/Other  ?Hypothyroidism  ? Renal/GU ?negative Renal ROS  ? ?  ?Musculoskeletal ? ?(+) Arthritis , Osteoarthritis,   ? Abdominal ?  ?Peds ? Hematology ? ?(+) Blood dyscrasia, anemia ,   ?Anesthesia Other Findings ? ? Reproductive/Obstetrics ?negative OB ROS ? ?  ? ? ? ? ? ? ? ? ? ? ? ? ? ?  ?  ? ? ? ? ? ? ?Anesthesia Physical ?Anesthesia Plan ? ?ASA: 4 ? ?Anesthesia Plan: General  ? ?Post-op Pain Management: Tylenol PO (pre-op)*  ? ?Induction: Intravenous ? ?PONV Risk Score and Plan: 4 or greater and Ondansetron, Dexamethasone and Treatment may vary due to age or medical condition ? ?Airway Management Planned: Oral ETT ? ?Additional Equipment:  ? ?Intra-op Plan:  ? ?Post-operative Plan: Extubation in OR ? ?Informed Consent: I have reviewed the patients History and Physical, chart, labs and discussed the procedure including the risks, benefits and alternatives for the proposed anesthesia with the patient or authorized representative who has indicated his/her understanding and acceptance.  ? ?Patient has DNR.  ? ?Dental advisory given ? ?Plan Discussed with: CRNA ? ?Anesthesia Plan Comments:   ? ? ? ? ?Anesthesia Quick Evaluation ? ?

## 2022-02-17 NOTE — Progress Notes (Signed)
Pharmacy Antibiotic Note ? ?Isabel Barnes is a 81 y.o. female admitted on 02/06/2022 with RLE abscess. Pt continues Vancomycin (Day #10). RLE cultures with E faecalis (amp-sensitive); unfortunately pt with allergy to PCN (rash) so continuing on vancomycin. ?Patient s/p debridement of leg abscess 3/15, wound vac in place. Plan back to OR today for for biologic tissue graft. ? ?SCr has remained stable. ? ?Vanc level yesterday = 16 (~ 6 hrs post dose), and 11 (18 hrs post dose); calculated AUC only 230 ? ?Plan: ?Increase vancomycin to 1250 mg IV q 24 hrs (goal AUC 400-550, eAUC 460, Scr rounded to 0.8) ? ? ?Height: 5' (152.4 cm) ?Weight: 53.4 kg (117 lb 11.6 oz) ?IBW/kg (Calculated) : 45.5 ? ?Temp (24hrs), Avg:97.9 ?F (36.6 ?C), Min:97.7 ?F (36.5 ?C), Max:98.5 ?F (36.9 ?C) ? ?Recent Labs  ?Lab 02/11/22 ?0146 02/12/22 ?0145 02/13/22 ?0258 02/14/22 ?0118 02/15/22 ?0510 02/16/22 ?0220 02/16/22 ?1441 02/17/22 ?0153  ?WBC 9.6 15.7* 10.3 9.3 8.6 8.3  --  7.9  ?CREATININE 0.54 0.70 0.64  --   --  0.84  --   --   ?VANCOPEAK  --   --   --   --   --   --  16*  --   ?VANCORANDOM  --   --   --   --   --   --   --  11  ? ?  ?Estimated Creatinine Clearance: 37.1 mL/min (by C-G formula based on SCr of 0.84 mg/dL).   ? ?Allergies  ?Allergen Reactions  ? Dilaudid [Hydromorphone Hcl] Shortness Of Breath  ? Gabapentin Other (See Comments)  ?  Hoarseness , headache and sore throat  ? Latex Rash  ?  Severe rash  ? Lyrica [Pregabalin] Other (See Comments)  ?  No balance , had to walk with cane , Blurred vision,weakness.  ? Oxycodone Shortness Of Breath and Other (See Comments)  ?  Cannot breathe.  ? Singulair [Montelukast] Shortness Of Breath and Other (See Comments)  ?  Vision issues, also  ? Ciprofloxacin Hcl Nausea And Vomiting and Other (See Comments)  ?  Nausea and vomiting with by mouth form  ? Codeine Other (See Comments)  ?  Hallucinations  ? Methadone Nausea And Vomiting and Other (See Comments)  ?  Severe nausea and vomiting  ?  Metronidazole Nausea And Vomiting and Other (See Comments)  ?  Gastric pain  ? Oysters [Shellfish Allergy] Other (See Comments)  ?  "Terrible gastric upset and cramping."  ? Clindamycin/Lincomycin Diarrhea and Nausea Only  ? Donepezil Diarrhea and Other (See Comments)  ?  Severe diarrhea  ? Sulfa Antibiotics Diarrhea and Other (See Comments)  ?  GI issues ?  ? Tape Other (See Comments)  ?  Severe rash  ? Zetia [Ezetimibe] Diarrhea  ? Elemental Sulfur Nausea And Vomiting  ? Iodine Rash  ? Other Rash  ?  All Antibiotic ointments/ creams  ? Oyster Shell Rash  ? Penicillin G Nausea And Vomiting and Rash  ? Penicillins Nausea And Vomiting and Rash  ?  Has patient had a PCN reaction causing immediate rash, facial/tongue/throat swelling, SOB or lightheadedness with hypotension: Yes ?Has patient had a PCN reaction causing severe rash involving mucus membranes or skin necrosis: No ?Has patient had a PCN reaction that required hospitalization No ?Has patient had a PCN reaction occurring within the last 10 years: No ?If all of the above answers are "NO", then may proceed with Cephalosporin use. ?  ? Povidone  Iodine Rash and Other (See Comments)  ?  Oyster shell products- Rash ?  ? Skintegrity Hydrogel [Skin Protectants, Misc.] Rash  ? Tapentadol Other (See Comments)  ?  Nightmares  ? ? ?Antimicrobials this admission: ?Ceftriaxone 3/11 >>3/20 ?Vanc 3/13 >> ? ?Vanc lvls 16/11 - calc AUC only 230 on 1250 q 48 hrs - inc to q 24 hrs ? ?Microbiology results: ?3/11 Bcx: NG ?3/15 Rt leg tissue: enterococcus faecalis (amp sensitive) ? ? ?Thank you for allowing pharmacy to be a part of this patient?s care. ? ?Reece Leader, Pharm D, BCPS, BCCP ?Clinical Pharmacist ? 02/17/2022 10:31 AM  ? ?[facility] pharmacy phone numbers are listed on [email or web address] ? ? ?

## 2022-02-17 NOTE — Anesthesia Procedure Notes (Signed)
Procedure Name: Intubation ?Date/Time: 02/17/2022 2:08 PM ?Performed by: Maude Leriche, CRNA ?Pre-anesthesia Checklist: Patient identified, Emergency Drugs available, Suction available and Patient being monitored ?Patient Re-evaluated:Patient Re-evaluated prior to induction ?Oxygen Delivery Method: Circle system utilized ?Preoxygenation: Pre-oxygenation with 100% oxygen ?Induction Type: IV induction ?Ventilation: Mask ventilation without difficulty ?Laryngoscope Size: Mac and 3 ?Grade View: Grade II ?Tube type: Oral ?Tube size: 6.5 mm ?Number of attempts: 1 ?Airway Equipment and Method: Stylet ?Placement Confirmation: ETT inserted through vocal cords under direct vision, positive ETCO2 and breath sounds checked- equal and bilateral ?Secured at: 21 cm ?Tube secured with: Tape ?Dental Injury: Teeth and Oropharynx as per pre-operative assessment  ? ? ? ? ?

## 2022-02-18 ENCOUNTER — Encounter (HOSPITAL_COMMUNITY): Payer: Self-pay | Admitting: Orthopedic Surgery

## 2022-02-18 DIAGNOSIS — D508 Other iron deficiency anemias: Secondary | ICD-10-CM

## 2022-02-18 LAB — CBC
HCT: 27.4 % — ABNORMAL LOW (ref 36.0–46.0)
Hemoglobin: 8.6 g/dL — ABNORMAL LOW (ref 12.0–15.0)
MCH: 26.5 pg (ref 26.0–34.0)
MCHC: 31.4 g/dL (ref 30.0–36.0)
MCV: 84.3 fL (ref 80.0–100.0)
Platelets: 363 10*3/uL (ref 150–400)
RBC: 3.25 MIL/uL — ABNORMAL LOW (ref 3.87–5.11)
RDW: 17.8 % — ABNORMAL HIGH (ref 11.5–15.5)
WBC: 10.3 10*3/uL (ref 4.0–10.5)
nRBC: 0 % (ref 0.0–0.2)

## 2022-02-18 LAB — BASIC METABOLIC PANEL
Anion gap: 3 — ABNORMAL LOW (ref 5–15)
BUN: 14 mg/dL (ref 8–23)
CO2: 27 mmol/L (ref 22–32)
Calcium: 7.4 mg/dL — ABNORMAL LOW (ref 8.9–10.3)
Chloride: 106 mmol/L (ref 98–111)
Creatinine, Ser: 0.7 mg/dL (ref 0.44–1.00)
GFR, Estimated: >60 mL/min (ref >60)
Glucose, Bld: 113 mg/dL — ABNORMAL HIGH (ref 70–99)
Potassium: 4.3 mmol/L (ref 3.5–5.1)
Sodium: 136 mmol/L (ref 135–145)

## 2022-02-18 MED ORDER — ENSURE ENLIVE PO LIQD
237.0000 mL | ORAL | Status: DC
  Administered 2022-02-19 – 2022-02-20 (×2): 237 mL via ORAL

## 2022-02-18 MED ORDER — ENOXAPARIN SODIUM 30 MG/0.3ML IJ SOSY
30.0000 mg | PREFILLED_SYRINGE | INTRAMUSCULAR | Status: DC
  Administered 2022-02-18 – 2022-02-21 (×4): 30 mg via SUBCUTANEOUS
  Filled 2022-02-18 (×4): qty 0.3

## 2022-02-18 MED ORDER — SODIUM CHLORIDE 0.9 % IV SOLN
250.0000 mg | Freq: Every day | INTRAVENOUS | Status: AC
  Administered 2022-02-18 – 2022-02-19 (×2): 250 mg via INTRAVENOUS
  Filled 2022-02-18 (×2): qty 20

## 2022-02-18 NOTE — Progress Notes (Signed)
? ?HD#12 ?SUBJECTIVE:  ?Patient Summary: Isabel Barnes is a 81 y.o. with a pertinent PMH of previous cerebellar stroke, venous insufficiency, iron deficiency anemia, GERD, HTN, hypothyroidism, aortic stenosis, and ankylosing spondylitis, who presented with a fall and admitted for Afib likely secondary to acute cellulitis of the RLE  ? ?Overnight Events: No acute events overnight ? ?Interim History: This is hospital day 12 for this patient. She states that as long as she gets pain meds she's okay. Her leg pain is a little better today, as her ace bandage was loosened. Son will be here today and he and daughter will talk and help pick a SNF. Hasn't had a BM but feels like she's going to today.  ? ?OBJECTIVE:  ?Vital Signs: ?Vitals:  ? 02/18/22 0327 02/18/22 0726 02/18/22 1141 02/18/22 1149  ?BP: (!) 96/57 (!) 109/42 (!) 92/45 (!) 101/52  ?Pulse: (!) 59 60 63   ?Resp: _0 ?Temp: (!) 97.3 ?F (36.3 ?C) 98.3 ?F (36.8 ?C) 98.2 ?F (36.8 ?C)   ?TempSrc: Oral Oral Oral   ?SpO2: 96% 100% 94% 96%  ?Weight:      ?Height:      ? ?Supplemental O2: Room Air ?SpO2: 96 % ?O2 Flow Rate (L/min): 2 L/min ? ?Filed Weights  ? 02/16/22 0414 02/17/22 0452 02/17/22 1220  ?Weight: 61 kg 53.4 kg 53.4 kg  ? ? ? ?Intake/Output Summary (Last 24 hours) at 02/18/2022 1221 ?Last data filed at 02/18/2022 1142 ?Gross per 24 hour  ?Intake 230.67 ml  ?Output 805 ml  ?Net -574.33 ml  ? ?Net IO Since Admission: -444.77 mL [02/18/22 1221] ? ?Physical Exam: ?General: Pleasant, thin-appearing female laying in bed. No acute distress. ?CV: RRR. IV/VI holosystolic murmur ?Pulmonary: Lungs CTAB. Normal effort. No wheezing or rales. ?Abdominal: Soft, nontender, nondistended. Normal bowel sounds. ?Extremities: RLE wrapped in ace bandange with wound VAC with ~200 cc fluid.  ?Skin: Warm and dry. Venous stasis dermatitis ?Neuro: A&Ox3. No focal deficit. ?Psych: Normal mood and affect ? ? ? ? ?ASSESSMENT/PLAN:  ?Assessment: ?Principal Problem: ?   Cellulitis of right lower extremity ?Active Problems: ?  Aortic valve stenosis ?  Anemia ?  Fluid collection (edema) in the arms, legs, hands and feet ?  Fall ?  Atrial fibrillation ([place]) ?  Goals of care, counseling/discussion ?  Severe protein-calorie malnutrition ([place]) ?  PVD (peripheral vascular disease) ([place]) ?  Abscess of right lower leg ? ? ?Plan: ?#Cellulitis of right lower extremity ?#PAD of RLE ?Patient underwent excisional debridement with ortho on 3/15 and found to have necrotic soft tissue. Wound VAC placed with ~400 cc serosanguinous output. Surgical cultures grew E. Faecalis and patient was continued on vancomycin. Surgical cultures from 3/15 are now also growing Bacteroides and flagyl was started yesterday. Patient underwent excisional debridement of skin and soft tissue yesterday with ortho and had a split-thickness skin graft placed at this time.  ?- Continue Vanc (day 9) and flagyl (day 2) ?- Tylenol 1000 mg tid ?- Oxycodone 5 mg q4h prn for moderate-severe pain ?- IV morphine 2 mg q6h for breakthrough pain ?- Robaxin 500 mg q8h prn for muscle spasms  ?- Continue crestor 5 mg daily and aspirin 81 mg daily  ? ?#Thrush ?Continue Nystatin oral suspension qid.  ? ?#GI bleed, stable ?#Iron deficiency anemia ?#Constipation  ?Last BM was ~3 days ago with no signs of bleeding noted. Hb remains stable today at 8.6.  ?- Protonix 40 mg daily ?- Senna bid and  miralax daily ?- Daily CBC ? ?#New onset Afib, converted to NSR ?Patient continues to remain in NSR. Continue amiodarone 200 mg bid. ?  ?#Severe Malnutrition ?Patient has severe malnutrition and has an albumin of 1.5.  ?- Continue multivitamin ?- Vitamin C 500 mg daily ?- Zinc 220 mg daily, day 7/14 ?- Ensure supplements three times daily  ? ?#Goals of care ?Patient met with the palliative team and noted that her primary concern is her decline in function and loss of independence. Family meeting was held and the plan will be to discharge patient to SNF  with outpatient palliative care follow up. TOC has been consulted to assist with SNF placement. SNF choices were presented to the patient last week and she and her family are working on choosing a SNF.  ? ?Best Practice: ?Diet: Regular diet ?IVF: Fluids: none ?VTE: enoxaparin (LOVENOX) injection 30 mg Start: 02/18/22 1315 ?SCDs Start: 02/17/22 1608 ?Code: DNR ?AB: Vanc, flagyl ?Therapy Recs: SNF ?Family Contact: Son and/or daughter, to be notified. ?DISPO: Anticipated discharge to Skilled nursing facility pending  SNF placement . ? ?Signature: ?Buddy Duty, D.O.  ?Internal Medicine Resident, PGY-1 ?[facility]  ?Pager: [phone] ?12:21 PM, 02/18/2022  ? ?Please contact the on call pager after 5 pm and on weekends at [phone]. ? ?

## 2022-02-18 NOTE — Plan of Care (Signed)

## 2022-02-18 NOTE — Anesthesia Postprocedure Evaluation (Signed)
Anesthesia Post Note ? ?Patient: Isabel Barnes ? ?Procedure(s) Performed: RIGHT LEG SKIN GRAFT (Right: Leg Lower) ? ?  ? ?Patient location during evaluation: PACU ?Anesthesia Type: General ?Level of consciousness: sedated and patient cooperative ?Pain management: pain level controlled ?Vital Signs Assessment: post-procedure vital signs reviewed and stable ?Respiratory status: spontaneous breathing ?Cardiovascular status: stable ?Anesthetic complications: no ? ? ?No notable events documented. ? ?Last Vitals:  ?Vitals:  ? 02/18/22 1141 02/18/22 1149  ?BP: (!) 92/45 (!) 101/52  ?Pulse: 63   ?Resp: 13 16  ?Temp: 36.8 ?C   ?SpO2: 94% 96%  ?  ?Last Pain:  ?Vitals:  ? 02/18/22 1141  ?TempSrc: Oral  ?PainSc:   ? ? ?  ?  ?  ?  ?  ?  ? ?[name] ? ? ? ? ?

## 2022-02-18 NOTE — Progress Notes (Signed)
**Note Isabel-Identified via Obfuscation** [name], female   DOB: [birth year], 81 y.o.   MRN: [id number] ?Patient is postoperative day 1 application of split-thickness skin graft to the right lower extremity.  There is 300 cc in the wound VAC canister.  Anticipate patient will need discharge to skilled nursing facility.  She may be weightbearing as tolerated on the right.  She should not need further antibiotics at time of discharge. ?

## 2022-02-18 NOTE — Progress Notes (Signed)
Nutrition Follow-up ? ?DOCUMENTATION CODES:  ? ?Severe malnutrition in context of chronic illness ? ?INTERVENTION:  ? ?Offer Ensure Enlive po once daily, each supplement provides 350 kcal and 20 grams of protein. ? ?Try Vital Cuisine Shake BID with meals, each supplement provides 520 kcal and 22 grams of protein ? ?Continue MVI with minerals daily. ? ?Continue Vitamin C 500 mg daily to promote wound healing. ? ?Continue Zinc 220 mg daily x 14 days to promote wound healing.  ? ?NUTRITION DIAGNOSIS:  ? ?Severe Malnutrition related to chronic illness (chronic pain, venous insufficiency) as evidenced by severe muscle depletion, severe fat depletion, percent weight loss (23% weight loss x 1 year). ? ?Ongoing  ? ?GOAL:  ? ?Patient will meet greater than or equal to 90% of their needs ? ?Progressing ? ?MONITOR:  ? ?PO intake, Supplement acceptance, Labs, Skin ? ?REASON FOR ASSESSMENT:  ? ?Consult ?Assessment of nutrition requirement/status, Wound healing ? ?ASSESSMENT:  ? ?81 yo female admitted with necrotic abscess of right medial calf with cellulitis. PMH includes chronic pain, osteoarthritis, venous insufficiency, HTN, GERD, constipation, iron deficiency anemia, hypothyroidism, CVA. ? ?S/P right leg skin graft 3/22.   ?VAC in place with 200 ml output charted so far today. ? ?Patient c/o being tired, sleepy, and in pain. RN aware of request for pain medication. She drank a few Ensure supplements, but does not wish to drink anymore. She doesn't want to try a different flavor. We discussed the importance of adequate protein and calorie intake for healing. Reviewed good sources of protein available on menu. Patient agreed to try chocolate Vital Cuisine shakes with meals. She is taking her vitamins as scheduled. ? ?Patient is receiving MVI with minerals, vitamin C, and Zinc supplementation to support wound healing.  ? ?Patient is currently on a regular diet.  ?Meal intakes not recorded since last week.  ? ?Labs reviewed.   ? ?Medications reviewed and include Colace, prednisone, Senokot-S, IV antibiotics. ? ? ?Diet Order:   ?Diet Order   ? ?       ?  Diet regular Room service appropriate? Yes; Fluid consistency: Thin  Diet effective now       ?  ? ?  ?  ? ?  ? ? ?EDUCATION NEEDS:  ? ?Education needs have been addressed ? ?Skin:  Skin Assessment: Skin Integrity Issues: ?Skin Integrity Issues:: Wound VAC, Other (Comment) ?Wound Vac: R leg necrotic abscess S/P debridement, S/P skin graft ?Other: non pressure wounds, pretibial L&R ? ?Last BM:  3/20 type 5 ? ?Height:  ? ?Ht Readings from Last 1 Encounters:  ?02/17/22 5' (1.524 m)  ? ? ?Weight:  ? ?Wt Readings from Last 1 Encounters:  ?02/17/22 53.4 kg  ? ? ?BMI:  Body mass index is 22.99 kg/m?. ? ?Estimated Nutritional Needs:  ? ?Kcal:  1500-1700 ? ?Protein:  70-85 gm ? ?Fluid:  >/= 1.5 L ? ? ? ?Gabriel Rainwater RD, LDN, CNSC ?Please refer to Amion for contact information.                                                       ? ?

## 2022-02-18 NOTE — TOC Progression Note (Signed)
Transition of Care (TOC) - Progression Note  ? ? ?Patient Details  ?Name: Isabel Barnes ?MRN: [id number] ?Date of Birth: 1941/10/21 ? ?Transition of Care (TOC) CM/SW Contact  ?Vinie Sill, LCSW ?Phone Number: ?02/18/2022, 5:01 PM ? ?Clinical Narrative:    ? ?CSW met with patient and her son,Mark. [name] explained he and the patient's grandson arrived in town from [place] and would be visiting facilities tomorrow and will try and informed CSW of facility choice as early as possible tomorrow.  ? ? ?Thurmond Butts, MSW, LCSW ?Clinical Social Worker ? ? ? ?Expected Discharge Plan: [place] ?Barriers to Discharge: Ship broker, Continued Medical Work up, SNF Pending bed offer ? ?Expected Discharge Plan and Services ?Expected Discharge Plan: [place] ?In-house Referral: Clinical Social Work ?  ?  ?Living arrangements for the past 2 months: [place] ?                ?  ?  ?  ?  ?  ?  ?  ?  ?  ?  ? ? ?Social Determinants of Health (SDOH) Interventions ?  ? ?Readmission Risk Interventions ?   ? View : No data to display.  ?  ?  ?  ? ? ?

## 2022-02-18 NOTE — Progress Notes (Signed)
Occupational Therapy Treatment ?Patient Details ?Name: Isabel Barnes ?MRN: [id number] ?DOB: 07-Jan-1941 ?Today's Date: 02/18/2022 ? ? ?History of present illness Pt is a 81 y.o. F who presents 02/06/2022 after a fall. CT head negative for acute abnormality. Hip x-ray negative for acute fracture. Found to have new a-fib in setting of moderate to severe AS. Significant PMH: cerebellar stroke, anemia, HTN, aortic stenosis, ankylosing spondylitis, venous insufficiency. ?  ?OT comments ? Patient received in bed and eager to participate. Patient required min assist and increased time to get to EOB due to complaints of RLE pain.  Patient was mod assist for transfer for safety with RW.  Patient required increased time for positioning in recliner due to difficulty with pain with BLEs elevated. Acute OT to continue to follow.   ? ?Recommendations for follow up therapy are one component of a multi-disciplinary discharge planning process, led by the attending physician.  Recommendations may be updated based on patient status, additional functional criteria and insurance authorization. ?   ?Follow Up Recommendations ? Skilled nursing-short term rehab (<3 hours/day)  ?  ?Assistance Recommended at Discharge Frequent or constant Supervision/Assistance  ?Patient can return home with the following ? A little help with walking and/or transfers;A little help with bathing/dressing/bathroom;Assistance with cooking/housework;Direct supervision/assist for medications management;Assist for transportation;Help with stairs or ramp for entrance ?  ?Equipment Recommendations ? Other (comment) (TBD)  ?  ?Recommendations for Other Services   ? ?  ?Precautions / Restrictions Precautions ?Precautions: Fall ?Precaution Comments: wound vac RLE ?Restrictions ?Weight Bearing Restrictions: Yes ?RLE Weight Bearing: Weight bearing as tolerated  ? ? ?  ? ?Mobility Bed Mobility ?Overal bed mobility: Needs Assistance ?Bed Mobility: Rolling, Sidelying to  Sit ?Rolling: Supervision ?Sidelying to sit: Min assist ?  ?  ?  ?General bed mobility comments: required assistance with advancing LEs towards EOB ?  ? ?Transfers ?Overall transfer level: Needs assistance ?Equipment used: Rolling walker (2 wheels) ?Transfers: Sit to/from Stand, Bed to chair/wheelchair/BSC ?Sit to Stand: Min assist ?  ?  ?Step pivot transfers: Mod assist ?  ?  ?General transfer comment: mod assist to steady and use RW ?  ?  ?Balance Overall balance assessment: Needs assistance ?Sitting-balance support: Feet supported, Single extremity supported ?Sitting balance-Leahy Scale: Fair ?Sitting balance - Comments: able to maintain sitting balance on EOB ?  ?Standing balance support: Bilateral upper extremity supported ?Standing balance-Leahy Scale: Poor ?Standing balance comment: reliant on RW for balance ?  ?  ?  ?  ?  ?  ?  ?  ?  ?  ?  ?   ? ?ADL either performed or assessed with clinical judgement  ? ?ADL Overall ADL's : Needs assistance/impaired ?  ?  ?Grooming: Wash/dry hands;Wash/dry face;Oral care;Set up;Sitting ?Grooming Details (indicate cue type and reason): performed seated in recliner ?  ?  ?  ?  ?  ?  ?  ?  ?Toilet Transfer: Moderate assistance;Rolling walker (2 wheels) ?Toilet Transfer Details (indicate cue type and reason): simulated with recliner ?  ?  ?  ?  ?  ?General ADL Comments: RLE pain limiting patient with standing tasks ?  ? ?Extremity/Trunk Assessment   ?  ?  ?  ?  ?  ? ?Vision   ?  ?  ?Perception   ?  ?Praxis   ?  ? ?Cognition Arousal/Alertness: Awake/alert ?Behavior During Therapy: [facility] for tasks assessed/performed ?Overall Cognitive Status: Within Functional Limits for tasks assessed ?Area of Impairment: Memory, Safety/judgement ?  ?  ?  ?  ?  ?  ?  ?  ?  ?  ?  Memory: Decreased short-term memory ?  ?Safety/Judgement: Decreased awareness of safety ?  ?  ?General Comments: was unaware of last pain medication ?  ?  ?   ?Exercises   ? ?  ?Shoulder Instructions   ? ? ?  ?General  Comments    ? ? ?Pertinent Vitals/ Pain       Pain Assessment ?Pain Assessment: Faces ?Faces Pain Scale: Hurts even more ?Pain Location: right calf/distal to Right knee and back ?Pain Descriptors / Indicators: Shooting, Sharp, Burning, Grimacing ?Pain Intervention(s): Limited activity within patient's tolerance, Monitored during session, Premedicated before session, Repositioned ? ?Home Living   ?  ?  ?  ?  ?  ?  ?  ?  ?  ?  ?  ?  ?  ?  ?  ?  ?  ?  ? ?  ?Prior Functioning/Environment    ?  ?  ?  ?   ? ?Frequency ? Min 2X/week  ? ? ? ? ?  ?Progress Toward Goals ? ?OT Goals(current goals can now be found in the care plan section) ? Progress towards OT goals: Progressing toward goals ? ?Acute Rehab OT Goals ?Patient Stated Goal: get better ?OT Goal Formulation: With patient ?Time For Goal Achievement: 02/21/22 ?Potential to Achieve Goals: Good ?ADL Goals ?Pt Will Perform Grooming: with modified independence;standing ?Pt Will Perform Lower Body Dressing: with modified independence;sit to/from stand ?Pt Will Transfer to Toilet: with modified independence;ambulating ?Additional ADL Goal #1: Pt will indep complete IADLmedication management task  ?Plan Discharge plan remains appropriate   ? ?Co-evaluation ? ? ?   ?  ?  ?  ?  ? ?  ?AM-PAC OT "6 Clicks" Daily Activity     ?Outcome Measure ? ? Help from another person eating meals?: None ?Help from another person taking care of personal grooming?: A Little ?Help from another person toileting, which includes using toliet, bedpan, or urinal?: A Lot ?Help from another person bathing (including washing, rinsing, drying)?: A Lot ?Help from another person to put on and taking off regular upper body clothing?: A Little ?Help from another person to put on and taking off regular lower body clothing?: A Lot ?6 Click Score: 16 ? ?  ?End of Session Equipment Utilized During Treatment: Rolling walker (2 wheels) ? ?OT Visit Diagnosis: Unsteadiness on feet (R26.81);Other abnormalities of  gait and mobility (R26.89);History of falling (Z91.81);Muscle weakness (generalized) (M62.81);Pain ?Pain - Right/Left: Right ?Pain - part of body: Leg ?  ?Activity Tolerance Patient tolerated treatment well;Patient limited by pain ?  ?Patient Left in chair;with call bell/phone within reach;with chair alarm set ?  ?Nurse Communication Mobility status ?  ? ?   ? ?Time: [id number] ?OT Time Calculation (min): 34 min ? ?Charges: OT General Charges ?$OT Visit: 1 Visit ?OT Treatments ?$Self Care/Home Management : 8-22 mins ?$Therapeutic Activity: 8-22 mins ? ?Lodema Hong, OTA ?Acute Rehabilitation Services  ?Pager [phone] ?Office [phone] ? ? ?[place] ?02/18/2022, 11:32 AM ?

## 2022-02-18 NOTE — Hospital Course (Addendum)
#Cellulitis, RLE ?#PAD of RLE ?Patient presented after a fall from home. She also presented with a large ecchymosis on the medial aspect of her right lower extremity and an area of palpable fluctuance, which was causing significant pain. RLE US showed extensive subcutaneous edema with a definite fluid collection and doppler was negative for DVT. She was started on IV vanc and rocephin initially and continued on this. On 3/15, patient underwent excisional debridement of right lower leg with ortho yesterday and was found to have massive necrotic soft tissue. Wound VAC was also placed at this time and continued to have serosanguinous output. Cultures grew E. Faecalis initially and Rocephin was discontinued. Later throughout hospitalization, cultures started to grow Bacteroides and Flagyl was added to her antibiotic regimen. Additionally, she had an aortogram with vascular surgery given that ABIs were performed and showed noncompressible ABI on the right and left 1.35. Bilateral TBI are abnormal with readings of 0.37 on the right and 0.54 on the left (left is near normal). 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 Patient also underwent another procedure with ortho on 3/22 where she had an excisional debridement of skin and soft tissue and had a split-thickness skin graft placed at this time. Vanc and flagyl were continued- will likely discontinue vancomycin at discharge and continue flagyl to complete a 7 day course of it (will get 2 more days worth). Pain was managed throughout hospitalization with prn oxycodone and morphine for breakthrough pain. Also continued the patient on crestor 5 mg daily and aspirin 81 mg daily for PAD. Patient will need to follow up with ortho (Dr. Sharol Given) in the outpatient setting regarding her wound VAC.  ? ?#GI bleed, stable ?#Iron deficiency  anemia ?#GERD ?Patient intially presented with weight loss, melanotic stools and gum bleeding, with concern for a bleeding disorder, such as Von Willebrand Disease. VW panel is negative, ruling out this diagnosis. Additionally, with weight loss and recent GI bleeding, there was initial concern for some GI malignancy. CEA and CA19-9 within normal limits.GI was initially consulted, however, the patient declined endoscopy and preferred to continue with medical management. Hb remained stable and the patient had no further melanotic stools. Protonix was increased on 3/15 to 40 mg bid x 1 month and starting on 4/15, she should continue on protonix 40 mg once daily.  ?Additionally, iron deficit was calculated to be ~936 mg. Once we achieved good source control, the patient received IV Ferrlecit 250 mg x 3 doses. Hb was stable on the day of discharge.  ? ?#Right pleural effusion ?Patient felt short of breath on the day of discharge, although her O2 sats remained >95% on room air. CXR was performed and showed a small pleural effusion on the right and she received 40 mg IV lasix. Discharged on home lasix 20 mg prn.  ? ?#New Afib, resolved ?Patient initially presented in Afib with rates in the 150s. Cardiology was initially consulted and started the patient on amiodarone. Patient quickly self converted to NSR and remained in NSR. She continued on amiodarone 200 mg bid  ? ?#Severe malnutrition ?#Frailty  ?Patient is frail in appearance and has low albumin level at 1.5. Started on Zinc 220 mg x 14 days total and Vitamin C 500 mg daily. Also recommend to continue Ensure supplements three times daily.  ? ?#Moderate to severe aortic stenosis ?Patient with aortic stenosis last seen on echo  on 03/17/21. She has a loud, IV/VI holosystolic murmur, consistent with aortic stenosis. Echo this admission with EF of 60-65%, no WMA, and grade II diastolic dysfunction. On echo, there is also severe calcification and thickening of the aortic  valve and moderate aortic valve stenosis. ? ?#Hypertension ?BP remained well controlled. Held home cozaar 25 mg.  ? ?#Ankylosing spondylitis ?#Osteoarthritis ?Patient has chronic back, shoulder, and knee pain. Continued on her home prednisone 5 mg daily  ? ?#Goals of care ?Given patient's multiple comorbidities, and desire to pursue conservative measures/not workup her significant weight loss further, we discussed introducing the patient to palliative care to address goals of care. Her primary concern is making sure that her pain is well controlled and that she is comfortable. She is DNR, but would like to continue receiving full scope of care. Patient will discharge to SNF with outpatient palliative care follow up.  ?

## 2022-02-18 NOTE — Progress Notes (Signed)
Physical Therapy Treatment ?Patient Details ?Name: Isabel Barnes ?MRN: [id number] ?DOB: 1941-01-05 ?Today's Date: 02/18/2022 ? ? ?History of Present Illness Pt is a 81 y.o. F who presents 02/06/2022 after a fall. CT head negative for acute abnormality. Hip x-ray negative for acute fracture. Found to have new a-fib in setting of moderate to severe AS. S/p R leg skin graft 3/22. Significant PMH: cerebellar stroke, anemia, HTN, aortic stenosis, ankylosing spondylitis, venous insufficiency. ? ?  ?PT Comments  ? ? Pt remains WBAT in bil lower extremity s/p R leg skin graft 3/22. Pt continues to be limited in pain, strength, balance, and activity tolerance. However, she was able to progress to ambulating ~6 ft anteriorly then ~6 ft posteriorly with min guard-minA using a RW. She reported feeling increasingly lightheaded in standing, but her BP remained stable between sitting and standing (but was soft overall). Will continue to follow acutely. Current recommendations remain appropriate. ?   ?Recommendations for follow up therapy are one component of a multi-disciplinary discharge planning process, led by the attending physician.  Recommendations may be updated based on patient status, additional functional criteria and insurance authorization. ? ?Follow Up Recommendations ? Skilled nursing-short term rehab (<3 hours/day) ?  ?  ?Assistance Recommended at Discharge Frequent or constant Supervision/Assistance  ?Patient can return home with the following A little help with bathing/dressing/bathroom;Assistance with cooking/housework;Assist for transportation;Help with stairs or ramp for entrance;A little help with walking and/or transfers ?  ?Equipment Recommendations ? BSC/3in1  ?  ?Recommendations for Other Services   ? ? ?  ?Precautions / Restrictions Precautions ?Precautions: Fall ?Precaution Comments: wound vac RLE; watch BP ?Restrictions ?Weight Bearing Restrictions: Yes ?RLE Weight Bearing: Weight bearing as tolerated  ?   ? ?Mobility ? Bed Mobility ?  ?  ?  ?  ?  ?  ?  ?General bed mobility comments: Pt up in recliner upon arrival. ?  ? ?Transfers ?Overall transfer level: Needs assistance ?Equipment used: Rolling walker (2 wheels) ?Transfers: Sit to/from Stand ?Sit to Stand: Min assist ?  ?  ?  ?  ?  ?General transfer comment: MinA to power up to stand from recliner. ?  ? ?Ambulation/Gait ?Ambulation/Gait assistance: Min guard, Min assist ?Gait Distance (Feet): 12 Feet ?Assistive device: Rolling walker (2 wheels) ?Gait Pattern/deviations: Step-to pattern, Trunk flexed, Decreased dorsiflexion - right, Antalgic ?Gait velocity: reduced ?Gait velocity interpretation: <1.31 ft/sec, indicative of household ambulator ?  ?General Gait Details: Pt with slow, small steps anteriorly and posteriorly. Cues to increase stride length, no LOB, min guard-minA for stability. ? ? ?Stairs ?  ?  ?  ?  ?  ? ? ?Wheelchair Mobility ?  ? ?Modified Rankin (Stroke Patients Only) ?  ? ? ?  ?Balance Overall balance assessment: Needs assistance ?  ?  ?  ?  ?Standing balance support: Bilateral upper extremity supported, During functional activity, Reliant on assistive device for balance ?Standing balance-Leahy Scale: Poor ?Standing balance comment: reliant on RW for balance ?  ?  ?  ?  ?  ?  ?  ?  ?  ?  ?  ?  ? ?  ?Cognition Arousal/Alertness: Awake/alert ?Behavior During Therapy: [facility] for tasks assessed/performed ?Overall Cognitive Status: Within Functional Limits for tasks assessed ?  ?  ?  ?  ?  ?  ?  ?  ?  ?  ?  ?  ?  ?  ?  ?  ?General Comments: Followed commands appropriately ?  ?  ? ?  ?  Exercises General Exercises - Lower Extremity ?Long Arc Quad: AROM, Both, 10 reps, Seated ?Hip Flexion/Marching: AROM, Both, Seated, 10 reps ? ?  ?General Comments General comments (skin integrity, edema, etc.): BP soft but stable with no decline going from sit to stand, but rather a slight increase; reported lightheadedness that increased with standing ?  ?  ? ?Pertinent  Vitals/Pain Pain Assessment ?Pain Assessment: Faces ?Faces Pain Scale: Hurts even more ?Pain Location: right calf/distal to Right knee and back ?Pain Descriptors / Indicators: Grimacing, Discomfort, Guarding ?Pain Intervention(s): Limited activity within patient's tolerance, Monitored during session, Repositioned  ? ? ?Home Living   ?  ?  ?  ?  ?  ?  ?  ?  ?  ?   ?  ?Prior Function    ?  ?  ?   ? ?PT Goals (current goals can now be found in the care plan section) Acute Rehab PT Goals ?Patient Stated Goal: to get better ?PT Goal Formulation: With patient ?Time For Goal Achievement: 03/04/22 ?Potential to Achieve Goals: Fair ?Progress towards PT goals: Progressing toward goals ? ?  ?Frequency ? ? ? Min 3X/week ? ? ? ?  ?PT Plan Current plan remains appropriate  ? ? ?Co-evaluation   ?  ?  ?  ?  ? ?  ?AM-PAC PT "6 Clicks" Mobility   ?Outcome Measure ? Help needed turning from your back to your side while in a flat bed without using bedrails?: A Little ?Help needed moving from lying on your back to sitting on the side of a flat bed without using bedrails?: A Little ?Help needed moving to and from a bed to a chair (including a wheelchair)?: A Little ?Help needed standing up from a chair using your arms (e.g., wheelchair or bedside chair)?: A Little ?Help needed to walk in hospital room?: Total ?Help needed climbing 3-5 steps with a railing? : Total ?6 Click Score: 14 ? ?  ?End of Session   ?Activity Tolerance: Patient limited by pain ?Patient left: in chair;with call bell/phone within reach;with chair alarm set ?  ?PT Visit Diagnosis: Unsteadiness on feet (R26.81);Muscle weakness (generalized) (M62.81);History of falling (Z91.81);Other abnormalities of gait and mobility (R26.89);Difficulty in walking, not elsewhere classified (R26.2);Pain ?Pain - Right/Left: Right ?Pain - part of body: Leg ?  ? ? ?Time: 1140-1201 ?PT Time Calculation (min) (ACUTE ONLY): 21 min ? ?Charges:  $Gait Training: 8-22 mins          ?           ? ?Moishe Spice, PT, DPT ?Acute Rehabilitation Services  ?Pager: [phone] ?Office: [phone] ? ? ? ?Isabel Barnes ?02/18/2022, 5:02 PM ? ?

## 2022-02-19 LAB — CBC
HCT: 25.7 % — ABNORMAL LOW (ref 36.0–46.0)
Hemoglobin: 8.2 g/dL — ABNORMAL LOW (ref 12.0–15.0)
MCH: 27 pg (ref 26.0–34.0)
MCHC: 31.9 g/dL (ref 30.0–36.0)
MCV: 84.5 fL (ref 80.0–100.0)
Platelets: 306 10*3/uL (ref 150–400)
RBC: 3.04 MIL/uL — ABNORMAL LOW (ref 3.87–5.11)
RDW: 18 % — ABNORMAL HIGH (ref 11.5–15.5)
WBC: 10.2 10*3/uL (ref 4.0–10.5)
nRBC: 0 % (ref 0.0–0.2)

## 2022-02-19 MED ORDER — MORPHINE SULFATE (PF) 2 MG/ML IV SOLN
1.0000 mg | Freq: Four times a day (QID) | INTRAVENOUS | Status: DC | PRN
  Administered 2022-02-19 – 2022-02-21 (×4): 1 mg via INTRAVENOUS
  Filled 2022-02-19 (×4): qty 1

## 2022-02-19 MED ORDER — TRIAMCINOLONE ACETONIDE 55 MCG/ACT NA AERO
1.0000 | INHALATION_SPRAY | Freq: Every day | NASAL | Status: DC
  Administered 2022-02-19 – 2022-02-22 (×4): 1 via NASAL
  Filled 2022-02-19: qty 10.8

## 2022-02-19 NOTE — Progress Notes (Signed)
Physical Therapy Treatment ?Patient Details ?Name: Isabel Barnes ?MRN: [id number] ?DOB: [birth year] ?Today's Date: 02/19/2022 ? ? ?History of Present Illness Pt is a 81 y.o. F who presents 02/06/2022 after a fall. CT head negative for acute abnormality. Hip x-ray negative for acute fracture. Found to have new a-fib in setting of moderate to severe AS. S/p R leg skin graft 3/22. Significant PMH: cerebellar stroke, anemia, HTN, aortic stenosis, ankylosing spondylitis, venous insufficiency. ? ?  ?PT Comments  ? ? Pt making good, steady progress with mobility, ambulating an increased distance of up to ~45 ft today. However, pt continues to mobilize at a very slow pace due to R leg pain. Pt also with lightheadedness, but BP stable, see General Comments below. Pt with x1 LOB needing minA to recover. Will continue to follow acutely. Current recommendations remain appropriate. ?   ?Recommendations for follow up therapy are one component of a multi-disciplinary discharge planning process, led by the attending physician.  Recommendations may be updated based on patient status, additional functional criteria and insurance authorization. ? ?Follow Up Recommendations ? Skilled nursing-short term rehab (<3 hours/day) ?  ?  ?Assistance Recommended at Discharge Frequent or constant Supervision/Assistance  ?Patient can return home with the following A little help with bathing/dressing/bathroom;Assistance with cooking/housework;Assist for transportation;Help with stairs or ramp for entrance;A little help with walking and/or transfers ?  ?Equipment Recommendations ? BSC/3in1  ?  ?Recommendations for Other Services   ? ? ?  ?Precautions / Restrictions Precautions ?Precautions: Fall ?Precaution Comments: wound vac RLE; watch BP ?Restrictions ?Weight Bearing Restrictions: Yes ?RLE Weight Bearing: Weight bearing as tolerated  ?  ? ?Mobility ? Bed Mobility ?Overal bed mobility: Needs Assistance ?Bed Mobility: Supine to Sit, Sit to Supine ?   ?  ?Supine to sit: Min guard, HOB elevated ?Sit to supine: Min assist ?  ?General bed mobility comments: Min guard and extra time with use of bed rails to sit up EOB, HOB elevated. MinA to lift legs back onto bed. ?  ? ?Transfers ?Overall transfer level: Needs assistance ?Equipment used: Rolling walker (2 wheels) ?Transfers: Sit to/from Stand ?Sit to Stand: Min guard ?  ?  ?  ?  ?  ?General transfer comment: Extra time, min guard for safety ?  ? ?Ambulation/Gait ?Ambulation/Gait assistance: Min guard, Min assist ?Gait Distance (Feet): 45 Feet ?Assistive device: Rolling walker (2 wheels) ?Gait Pattern/deviations: Trunk flexed, Decreased dorsiflexion - right, Antalgic, Step-through pattern, Decreased dorsiflexion - left ?Gait velocity: reduced ?Gait velocity interpretation: <1.31 ft/sec, indicative of household ambulator ?  ?General Gait Details: Pt with slow, small steps with flexed posture. Cues to improve posture, good carryover. Cues to improve feet clearance, momentary success. x1 posterior LOB needing minA to recover. ? ? ?Stairs ?  ?  ?  ?  ?  ? ? ?Wheelchair Mobility ?  ? ?Modified Rankin (Stroke Patients Only) ?  ? ? ?  ?Balance Overall balance assessment: Needs assistance ?Sitting-balance support: Feet supported, No upper extremity supported ?Sitting balance-Leahy Scale: Fair ?Sitting balance - Comments: able to maintain sitting balance on EOB ?  ?Standing balance support: Bilateral upper extremity supported, During functional activity, Reliant on assistive device for balance ?Standing balance-Leahy Scale: Poor ?Standing balance comment: reliant on RW for balance ?  ?  ?  ?  ?  ?  ?  ?  ?  ?  ?  ?  ? ?  ?Cognition Arousal/Alertness: Awake/alert ?Behavior During Therapy: [facility] for tasks assessed/performed ?Overall Cognitive Status: Impaired/Different from  baseline ?Area of Impairment: Memory ?  ?  ?  ?  ?  ?  ?  ?  ?  ?  ?Memory: Decreased short-term memory ?  ?  ?  ?  ?General Comments: Reports increased STM  deficits ?  ?  ? ?  ?Exercises   ? ?  ?General Comments General comments (skin integrity, edema, etc.): BP 102/58 supine, 104/60 sitting, 117/46 standing, 129/108 while ambulating (~3 min standing), reported being lightheaded ?  ?  ? ?Pertinent Vitals/Pain Pain Assessment ?Pain Assessment: Faces ?Faces Pain Scale: Hurts even more ?Pain Location: R leg ?Pain Descriptors / Indicators: Grimacing, Discomfort, Guarding ?Pain Intervention(s): Monitored during session, Limited activity within patient's tolerance, Repositioned  ? ? ?Home Living   ?  ?  ?  ?  ?  ?  ?  ?  ?  ?   ?  ?Prior Function    ?  ?  ?   ? ?PT Goals (current goals can now be found in the care plan section) Acute Rehab PT Goals ?Patient Stated Goal: to get better ?PT Goal Formulation: With patient ?Time For Goal Achievement: 03/04/22 ?Potential to Achieve Goals: Fair ?Progress towards PT goals: Progressing toward goals ? ?  ?Frequency ? ? ? Min 3X/week ? ? ? ?  ?PT Plan Current plan remains appropriate  ? ? ?Co-evaluation   ?  ?  ?  ?  ? ?  ?AM-PAC PT "6 Clicks" Mobility   ?Outcome Measure ? Help needed turning from your back to your side while in a flat bed without using bedrails?: A Little ?Help needed moving from lying on your back to sitting on the side of a flat bed without using bedrails?: A Little ?Help needed moving to and from a bed to a chair (including a wheelchair)?: A Little ?Help needed standing up from a chair using your arms (e.g., wheelchair or bedside chair)?: A Little ?Help needed to walk in hospital room?: A Little ?Help needed climbing 3-5 steps with a railing? : Total ?6 Click Score: 16 ? ?  ?End of Session Equipment Utilized During Treatment: Gait belt ?Activity Tolerance: Patient tolerated treatment well ?Patient left: with call bell/phone within reach;in bed;with bed alarm set ?  ?PT Visit Diagnosis: Unsteadiness on feet (R26.81);Muscle weakness (generalized) (M62.81);History of falling (Z91.81);Other abnormalities of gait and  mobility (R26.89);Difficulty in walking, not elsewhere classified (R26.2);Pain ?Pain - Right/Left: Right ?Pain - part of body: Leg ?  ? ? ?Time: [id number] ?PT Time Calculation (min) (ACUTE ONLY): 29 min ? ?Charges:  $Gait Training: 8-22 mins ?$Therapeutic Activity: 8-22 mins          ?          ? ?Moishe Spice, PT, DPT ?Acute Rehabilitation Services  ?Pager: [phone] ?Office: [phone] ? ? ? ?Maretta Bees Pettis ?02/19/2022, 5:16 PM ? ?

## 2022-02-19 NOTE — TOC Progression Note (Signed)
Transition of Care (TOC) - Progression Note  ? ? ?Patient Details  ?Name: Isabel Barnes ?MRN: [id number] ?Date of Birth: [birth year] ? ?Transition of Care (TOC) CM/SW Contact  ?Eduard Roux, LCSW ?Phone Number: ?02/19/2022, 3:10 PM ? ?Clinical Narrative:    ?Received call form patient's daughter- informed her other choices are: [facility]. ? ?[facility] - No response  ?[facility]- insurance not in network ?[facility]- no longer have a bed  ?[facility] - no response ?[facility]- sent message  ?[facility]- sent message  ?[facility]- sent referral  ? ? ?Expected Discharge Plan: Skilled Nursing Facility ?Barriers to Discharge: English as a second language teacher, Continued Medical Work up, SNF Pending bed offer ? ?Expected Discharge Plan and Services ?Expected Discharge Plan: Skilled Nursing Facility ?In-house Referral: Clinical Social Work ?  ?  ?Living arrangements for the past 2 months: Single Family Home ?                ?  ?  ?  ?  ?  ?  ?  ?  ?  ?  ? ? ?Social Determinants of Health (SDOH) Interventions ?  ? ?Readmission Risk Interventions ?   ? View : No data to display.  ?  ?  ?  ? ? ?

## 2022-02-19 NOTE — TOC Progression Note (Signed)
Transition of Care (TOC) - Progression Note  ? ? ?Patient Details  ?Name: [name] ?MRN: [id number] ?Date of Birth: 06-May-1941 ? ?Transition of Care (TOC) CM/SW Contact  ?Vinie Sill, LCSW ?Phone Number: ?02/19/2022, 8:55 AM ? ?Clinical Narrative:    ? ?Received message- patient's daughter wants to send referral to [facility].  ? ?[facility] received referral and will review  ?LVM for [facility]  ? ?Thurmond Butts, MSW, LCSW ?Clinical Social Worker ? ? ? ?Expected Discharge Plan: [place] ?Barriers to Discharge: Ship broker, Continued Medical Work up, SNF Pending bed offer ? ?Expected Discharge Plan and Services ?Expected Discharge Plan: [place] ?In-house Referral: Clinical Social Work ?  ?  ?Living arrangements for the past 2 months: [place] ?                ?  ?  ?  ?  ?  ?  ?  ?  ?  ?  ? ? ?Social Determinants of Health (SDOH) Interventions ?  ? ?Readmission Risk Interventions ?   ? View : No data to display.  ?  ?  ?  ? ? ?

## 2022-02-19 NOTE — Progress Notes (Signed)
? ?HD#13 ?SUBJECTIVE:  ?Patient Summary: Isabel Barnes is a 81 y.o. with a pertinent PMH of previous cerebellar stroke, venous insufficiency, iron deficiency anemia, GERD, HTN, hypothyroidism, aortic stenosis, and ankylosing spondylitis, who presented with a fall and admitted for Afib likely secondary to acute cellulitis of the RLE   ? ?Overnight Events: No acute events overnight ? ?Interim History: This is hospital day 13 for this patient who was seen and evaluated at the bedside this morning. She states that her pain is about the same as yesterday and that she feels tired/sleepy this morning. Her son and grandson will be here later this morning. She has nasal congestion but nasacort has helped at home. No BM yesterday, so it's been a few days. ? ?OBJECTIVE:  ?Vital Signs: ?Vitals:  ? 02/18/22 1553 02/18/22 2100 02/19/22 0043 02/19/22 0458  ?BP: (!) 111/49  (!) 104/50 106/62  ?Pulse: 65  60 60  ?Resp: 15  18 15   ?Temp: 98.2 ?F (36.8 ?C) 98.1 ?F (36.7 ?C) 97.6 ?F (36.4 ?C) 97.9 ?F (36.6 ?C)  ?TempSrc: Oral Oral Oral Oral  ?SpO2: 100%  98% 97%  ?Weight:    53.4 kg  ?Height:      ? ?Supplemental O2: Room Air ?SpO2: 97 % ?O2 Flow Rate (L/min): 2 L/min ? ?Filed Weights  ? 02/17/22 0452 02/17/22 1220 02/19/22 0458  ?Weight: 53.4 kg 53.4 kg 53.4 kg  ? ? ? ?Intake/Output Summary (Last 24 hours) at 02/19/2022 0655 ?Last data filed at 02/18/2022 1756 ?Gross per 24 hour  ?Intake 240 ml  ?Output 650 ml  ?Net -410 ml  ? ?Net IO Since Admission: -354.77 mL [02/19/22 0655] ? ?Physical Exam: ?General: Pleasant, thin-appearing female laying in bed. No acute distress. ?CV: RRR. IV/VI holosystolic murmur. ?Pulmonary: Lungs CTAB. Normal effort. No wheezing or rales. ?Abdominal: Soft, nontender, nondistended. Normal bowel sounds. ?Extremities: RLE wrapped in ace bandage. Wound VAC in place with serosanguinous output. 1-2+ bilateral LE edema.  ?Skin: Venous stasis dermatitis ?Neuro: A&Ox3. No focal deficit. ?Psych: Normal mood and  affect ? ? ? ? ?ASSESSMENT/PLAN:  ?Assessment: ?Principal Problem: ?  Cellulitis of right lower extremity ?Active Problems: ?  Aortic valve stenosis ?  Anemia ?  Fluid collection (edema) in the arms, legs, hands and feet ?  Fall ?  Atrial fibrillation ([place]) ?  Goals of care, counseling/discussion ?  Severe protein-calorie malnutrition ([place]) ?  PVD (peripheral vascular disease) ([place]) ?  Abscess of right lower leg ? ? ?Plan: ?#Cellulitis of right lower extremity ?#PAD of RLE ?Patient underwent excisional debridement and wound VAC placement with ortho on 3/15 and found to have necrotic soft tissue. Surgical cultures initially grew E. Faecalis and patient was continued on vanc. Flagyl was added on once cultures began to grow Bacteroides. She also underwent excisional debridement of skin/soft tissue and had split thickness skin graft on 3/22. Patient is medically stable for discharge to SNF at this point, however, she is still pending placement.  ?- Continue vanc (day 9) through discharge and flagyl (day 3/7) ?- Tylenol 1000 mg tid ?- Oxycodone 5 mg q4h prn for moderate-severe pain ?- IV morphine 1 mg q6h for breakthrough pain ?- Robaxin 500 mg q8h prn for muscle spasms  ?- Continue crestor 5 mg daily and aspirin 81 mg daily  ?  ?#Thrush ?Continue Nystatin oral suspension qid.  ? ?#GI bleed, stable ?#Iron deficiency anemia ?#Constipation  ?Last BM was ~3 days ago. Hb remains stable at 8.3 today. Iron deficit calculcated at ~  936 mg and she received IV Ferrlecit 250 mg yesterday.   ?- Protonix 40 mg daily ?- IV Ferrlecit 250 mg again today ?- Senna bid and miralax daily ?- Daily CBC ? ?#New onset Afib, converted to NSR ?Patient continues to remain in NSR. Continue amiodarone 200 mg bid. ? ?#Nasal congestion ?Patient notes that she has been dealing with a lot of post nasal drip- she usually uses nasacort at home and benadryl. Discussed avoiding benadryl as to try to avoid over-sedation between that and oxycodone- patient  expressed understanding.  ?- Nasacort inhaler 1 spray each nare daily  ?  ?#Severe Malnutrition ?Patient has severe malnutrition and has an albumin of 1.5.  ?- Continue multivitamin ?- Vit C 500 mg daily ?- Zinc 220 mg daily, day 8/14 ?- Ensure supplements tid ? ?#Goals of care ?#Disposition planning ?Patient's goal is to be discharged to SNF, although she and her family are still deciding which SNF to choose. Patient is medically stable at this point and is pending SNF placement.  ? ?Best Practice: ?Diet: Regular diet ?IVF: Fluids: none ?VTE: enoxaparin (LOVENOX) injection 30 mg Start: 02/18/22 1315 ?SCDs Start: 02/17/22 1608 ?Code: DNR ?AB: Vanc, flagyl  ?Therapy Recs: SNF ?Family Contact: Son, to be notified. ?DISPO: Anticipated discharge to Skilled nursing facility pending  SNF placement . ? ?Signature: ?Buddy Duty, D.O.  ?Internal Medicine Resident, PGY-1 ?[facility]  ?Pager: [phone] ?6:55 AM, 02/19/2022  ? ?Please contact the on call pager after 5 pm and on weekends at [phone]. ? ?

## 2022-02-19 NOTE — TOC Progression Note (Signed)
Transition of Care (TOC) - Progression Note  ? ? ?Patient Details  ?Name: Isabel Barnes ?MRN: [id number] ?Date of Birth: [birth year] ? ?Transition of Care (TOC) CM/SW Contact  ?Eduard Roux, LCSW ?Phone Number: ?02/19/2022, 4:00 PM ? ?Clinical Narrative:    ? ?Called patient's daughter, informed [facility]- confirmed bed offer but they can not admit until Monday. [facility] does not accept admission over the weekend. ? ? ?CSW will inform weekend CSW to prepare patient for discharge on Monday. ? ?Antony Blackbird, MSW, LCSW ?Clinical Social Worker ? ? ? ? ?Expected Discharge Plan: Skilled Nursing Facility ?Barriers to Discharge: English as a second language teacher, Continued Medical Work up, SNF Pending bed offer ? ?Expected Discharge Plan and Services ?Expected Discharge Plan: Skilled Nursing Facility ?In-house Referral: Clinical Social Work ?  ?  ?Living arrangements for the past 2 months: Single Family Home ?                ?  ?  ?  ?  ?  ?  ?  ?  ?  ?  ? ? ?Social Determinants of Health (SDOH) Interventions ?  ? ?Readmission Risk Interventions ?   ? View : No data to display.  ?  ?  ?  ? ? ?

## 2022-02-20 LAB — CBC
HCT: 24.8 % — ABNORMAL LOW (ref 36.0–46.0)
Hemoglobin: 7.7 g/dL — ABNORMAL LOW (ref 12.0–15.0)
MCH: 26.6 pg (ref 26.0–34.0)
MCHC: 31 g/dL (ref 30.0–36.0)
MCV: 85.5 fL (ref 80.0–100.0)
Platelets: 329 10*3/uL (ref 150–400)
RBC: 2.9 MIL/uL — ABNORMAL LOW (ref 3.87–5.11)
RDW: 18.6 % — ABNORMAL HIGH (ref 11.5–15.5)
WBC: 9 10*3/uL (ref 4.0–10.5)
nRBC: 0 % (ref 0.0–0.2)

## 2022-02-20 LAB — BASIC METABOLIC PANEL
Anion gap: 3 — ABNORMAL LOW (ref 5–15)
BUN: 14 mg/dL (ref 8–23)
CO2: 28 mmol/L (ref 22–32)
Calcium: 7.4 mg/dL — ABNORMAL LOW (ref 8.9–10.3)
Chloride: 107 mmol/L (ref 98–111)
Creatinine, Ser: 0.66 mg/dL (ref 0.44–1.00)
GFR, Estimated: >60 mL/min (ref >60)
Glucose, Bld: 89 mg/dL (ref 70–99)
Potassium: 4.4 mmol/L (ref 3.5–5.1)
Sodium: 138 mmol/L (ref 135–145)

## 2022-02-20 MED ORDER — SODIUM CHLORIDE 0.9 % IV SOLN
250.0000 mg | Freq: Every day | INTRAVENOUS | Status: AC
  Administered 2022-02-20: 250 mg via INTRAVENOUS
  Filled 2022-02-20: qty 20

## 2022-02-20 NOTE — Progress Notes (Signed)
Received page from RN about worsening right lower leg swelling.  Patient seen at bedside with RNs present.  Patient states that her right leg feels more swollen than usual and that she is having more pain in her leg due to the wrap fitting tightly around it.  The pain is not significantly worse but has been bothersome to the patient.  No other concerns at this time. ? ?Exam: RLE with significant swelling compared to images from 3/13.  The right foot is cool to touch. Dopplers were obtained to the RLE by RN earlier and were normal. ? ?Plan: ?Discussed with [place], ortho PA on call, who is OK with RN cutting the Ace wrap to relieve some pressure from the swelling.  Advised the patient to contact the RN if she has significantly worsening pain in the interim. ?

## 2022-02-20 NOTE — Progress Notes (Signed)
? ?HD#14 ?SUBJECTIVE:  ?Patient Summary: Isabel Barnes is a 81 y.o. with a pertinent PMH of previous cerebellar stroke, venous insufficiency, iron deficiency anemia, GERD, HTN, hypothyroidism, aortic stenosis, and ankylosing spondylitis, who presented with a fall and admitted for Afib likely secondary to acute cellulitis of the RLE ? ?Overnight Events: No acute events overnight ? ?Interim History: This is hospital day 14 for this patient who was seen and evaluated at the bedside this morning. She states that she did not sleep much because of her right leg pain. The patient has not had a bowel movement in a few days and feels constipated. Additionally, she notes that her mouth still feels raw, although this is improved. She is keeping her spirits up about getting to her SNF early next week.  ? ?OBJECTIVE:  ?Vital Signs: ?Vitals:  ? 02/19/22 1949 02/19/22 2358 02/20/22 0348 02/20/22 0500  ?BP: (!) 101/53 (!) 99/56 (!) 121/52   ?Pulse: 62 (!) 59 63   ?Resp: 17 18 14    ?Temp: 98 ?F (36.7 ?C) 98.1 ?F (36.7 ?C) (!) 97.5 ?F (36.4 ?C)   ?TempSrc: Oral Oral Oral   ?SpO2: 96% 95% 96%   ?Weight:  57.5 kg  57.5 kg  ?Height:      ? ?Supplemental O2: Room Air ?SpO2: 96 % ?O2 Flow Rate (L/min): 2 L/min ? ?Filed Weights  ? 02/19/22 0458 02/19/22 2358 02/20/22 0500  ?Weight: 53.4 kg 57.5 kg 57.5 kg  ? ? ? ?Intake/Output Summary (Last 24 hours) at 02/20/2022 0654 ?Last data filed at 02/19/2022 2041 ?Gross per 24 hour  ?Intake 463.05 ml  ?Output 75 ml  ?Net 388.05 ml  ? ?Net IO Since Admission: 33.28 mL [02/20/22 0654] ? ?Physical Exam: ?General: Pleasant, thin-appearing elderly female laying in bed. No acute distress. ?CV: RRR. IV/VI holosystolic murmur.  ?Pulmonary: Lungs CTAB. Normal work of breathing on room air ?Abdominal: Soft, nontender, nondistended. Normal bowel sounds. ?Extremities: RLE wrapped in ace bandage. Wound VAC in place with serosanguinous output. 1+ edema in right foot. 1-2+ edema in left lower extremity.   ?Skin: Venous stasis dermatitis ?Neuro: A&Ox3. No focal deficit. ?Psych: Normal mood and affect ? ? ? ?ASSESSMENT/PLAN:  ?Assessment: ?Principal Problem: ?  Cellulitis of right lower extremity ?Active Problems: ?  Aortic valve stenosis ?  Anemia ?  Fluid collection (edema) in the arms, legs, hands and feet ?  Fall ?  Atrial fibrillation ([place]) ?  Goals of care, counseling/discussion ?  Severe protein-calorie malnutrition ([place]) ?  PVD (peripheral vascular disease) ([place]) ?  Abscess of right lower leg ? ? ?Plan: ?#Cellulitis of right lower extremity ?#PAD of RLE ?Patient underwent excisional debridement and wound VAC placement with ortho on 3/15 and was found to have necrotic soft tissue. Surgical cultures initially grew E. Faecalis and patient was continued on vanc (day 10 of therapy). Flagyl was added on once final cultures also grew Bacteroides. She also underwent excisional debridement of skin/soft tissue and had split thickness skin graft on 3/22. She remains medically stable for discharge to SNF at this time.  ?- Continue vanc (day 10) through discharge ?- Continue Flagyl (day 4/7) ?- Tylenol 1000 mg tid ?- Oxycodone 5 mg q4h prn for moderate-severe pain ?- IV morphine 1 mg q6h for breakthrough pain ?- Robaxin 500 mg q8h prn for muscle spasms  ?- Continue crestor 5 mg daily and aspirin 81 mg daily  ? ?#Thrush ?Patient's mouth continues to feel raw, but the white plaques on her tongue are  improved compared to a few days ago.  ?- Continue Nystatin oral suspension qid ? ?#GI bleed ?#Iron deficiency anemia ?#Constipation  ?Last BM was >3 days ago- no signs of melena or hematochezia at that time. Hb has been slowly downtrending, today it is 7.7 (was 9.7 four days ago). Iron deficit was calculated to be ~936 mg and she is s/p 500 mg of IV Ferrlecit this admission. Will give another prn dose of miralax to assist the patient with having a bowel movement. Additionally, with her slow Hb decline, the patient may need a unit  of blood prior to discharge to SNF. With her likely slow GI bleed, she elected to pursue conservative measures and opted to not have an endoscopy earlier this admission when GI was consulted.  ?- Protonix 40 mg daily ?- IV Ferrlecit 250 mg today (third dose) ?- Senna and colace bid; miralax prn ?- Daily CBC ? ?#Afib, converted to NSR ?Remains in NSR and continued on amiodarone 200 mg bid.  ? ?#Nasal congestion ?- Continue nasacort inhaler daily  ? ?#Severe malnutrition ?#Frailty  ?Generally, patient is frail in appearance and has low albumin level at 1.5. Will continue vitamins as noted below, to promote wound healing and overall nutritional status.  ?- Vitamin C 500 mg daily ?- Zinc 220 mg daily, day 9/14 ?- Ensure supplements tid ? ?#Goals of care ?#Disposition planning ?Patient's goal is to be discharged to SNF and social work has been assisting with this Patient is medically stable at this point and is pending SNF placement. [facility] a bed available for this patient on Monday- pending insurance authorization still. ? ?Best Practice: ?Diet: Regular diet ?IVF: Fluids: none ?VTE: enoxaparin (LOVENOX) injection 30 mg Start: 02/18/22 1315 ?SCDs Start: 02/17/22 1608 ?Code: DNR ?AB: Vanc, flagyl  ?Therapy Recs: SNF, DME: other BSC/3 in 1 ?DISPO: Anticipated discharge in 2 days to [place] facility pending  SNF placement . ? ?Signature: ?Buddy Duty, D.O.  ?Internal Medicine Resident, PGY-1 ?[facility]  ?Pager: [phone] ?6:54 AM, 02/20/2022  ? ?Please contact the on call pager after 5 pm and on weekends at [phone]. ? ?

## 2022-02-20 NOTE — Plan of Care (Signed)

## 2022-02-20 NOTE — Progress Notes (Signed)
Pharmacy Antibiotic Note ? ?Isabel Barnes is a 81 y.o. female admitted on 02/06/2022 with RLE abscess. Pt continues Vancomycin (Day #10). RLE cultures with E faecalis (amp-sensitive); unfortunately pt with allergy to PCN (rash) so continuing on vancomycin. Patient s/p debridement of leg abscess 3/15, wound vac in place. Patient went to OR for biologic tissue graft 3/22. Per ortho note on 3/23, no need for antibiotics at discharge.  ? ?Scr remains stable at 0.6, patient remains afebrile. WBC within normal limits.  ? ?Plan: ?Continue vancomycin 1250 mg IV q 48 hrs (eAUC 411, Scr rounded to 0.8) ?Goal AUC 400-550 ?F/u plans for antibiotics as discharge date gets closer ? ? ?Height: 5' (152.4 cm) ?Weight: 57.5 kg (126 lb 12.2 oz) ?IBW/kg (Calculated) : 45.5 ? ?Temp (24hrs), Avg:97.9 ?F (36.6 ?C), Min:97.5 ?F (36.4 ?C), Max:98.1 ?F (36.7 ?C) ? ?Recent Labs  ?Lab 02/16/22 ?0220 02/16/22 ?1441 02/17/22 ?0153 02/18/22 ?8466 02/19/22 ?0211 02/20/22 ?5993  ?WBC 8.3  --  7.9 10.3 10.2 9.0  ?CREATININE 0.84  --   --  0.70  --  0.66  ?VANCOPEAK  --  16*  --   --   --   --   ?VANCORANDOM  --   --  11  --   --   --   ? ?  ?Estimated Creatinine Clearance: 43.1 mL/min (by C-G formula based on SCr of 0.66 mg/dL).   ? ?Allergies  ?Allergen Reactions  ? Dilaudid [Hydromorphone Hcl] Shortness Of Breath  ? Gabapentin Other (See Comments)  ?  Hoarseness , headache and sore throat  ? Latex Rash  ?  Severe rash  ? Lyrica [Pregabalin] Other (See Comments)  ?  No balance , had to walk with cane , Blurred vision,weakness.  ? Oxycodone Shortness Of Breath and Other (See Comments)  ?  Tolerated oxycodone during admission to hospital 02/06/22  ? Singulair [Montelukast] Shortness Of Breath and Other (See Comments)  ?  Vision issues, also  ? Ciprofloxacin Hcl Nausea And Vomiting and Other (See Comments)  ?  Nausea and vomiting with by mouth form  ? Codeine Other (See Comments)  ?  Hallucinations  ? Methadone Nausea And Vomiting and Other (See  Comments)  ?  Severe nausea and vomiting  ? Metronidazole Nausea And Vomiting and Other (See Comments)  ?  Gastric pain  ? Oysters [Shellfish Allergy] Other (See Comments)  ?  "Terrible gastric upset and cramping."  ? Clindamycin/Lincomycin Diarrhea and Nausea Only  ? Donepezil Diarrhea and Other (See Comments)  ?  Severe diarrhea  ? Sulfa Antibiotics Diarrhea and Other (See Comments)  ?  GI issues ?  ? Tape Other (See Comments)  ?  Severe rash  ? Zetia [Ezetimibe] Diarrhea  ? Elemental Sulfur Nausea And Vomiting  ? Iodine Rash  ? Other Rash  ?  All Antibiotic ointments/ creams  ? Oyster Shell Rash  ? Penicillin G Nausea And Vomiting and Rash  ? Penicillins Nausea And Vomiting and Rash  ?  Has patient had a PCN reaction causing immediate rash, facial/tongue/throat swelling, SOB or lightheadedness with hypotension: Yes ?Has patient had a PCN reaction causing severe rash involving mucus membranes or skin necrosis: No ?Has patient had a PCN reaction that required hospitalization No ?Has patient had a PCN reaction occurring within the last 10 years: No ?If all of the above answers are "NO", then may proceed with Cephalosporin use. ?  ? Povidone Iodine Rash and Other (See Comments)  ?  [employer]  shell products- Rash ?  ? Skintegrity Hydrogel [Skin Protectants, Misc.] Rash  ? Tapentadol Other (See Comments)  ?  Nightmares  ? ? ?Antimicrobials this admission: ?Ceftriaxone 3/11 >>3/20 ?Vanc 3/13 >> ? ?Vanc peak 16 ?Vanc random 11 ? ?Microbiology results: ?3/11 Bcx: NG ?3/15 Rt leg tissue: enterococcus faecalis (amp sensitive) ? ?Thank you for involving pharmacy in this patient's care. ? ?Arnette Felts, PharmD ?PGY1 Ambulatory Care Pharmacy Resident ?02/20/2022 4:09 PM ? ?**Pharmacist phone directory can be found on [email or web address] listed under [facility]** ?

## 2022-02-20 NOTE — TOC Progression Note (Signed)
Transition of Care (TOC) - Progression Note  ? ? ?Patient Details  ?Name: Isabel Barnes ?MRN: [id number] ?Date of Birth: 09/29/1941 ? ?Transition of Care (TOC) CM/SW Contact  ?Patrice Paradise, LCSW ?Phone Number: [phone] ?02/20/2022, 2:22 PM ? ?Clinical Narrative:    ? ?CSW started pt's [place] authorization, [employer] [employer] #[id number] with start date 02/21/2022. ? ?TOC team will continue to assist with discharge planning needs.  ? ?Expected Discharge Plan: Skilled Nursing Facility ?Barriers to Discharge: English as a second language teacher, Continued Medical Work up, SNF Pending bed offer ? ?Expected Discharge Plan and Services ?Expected Discharge Plan: Skilled Nursing Facility ?In-house Referral: Clinical Social Work ?  ?  ?Living arrangements for the past 2 months: Single Family Home ?                ?  ?  ?  ?  ?  ?  ?  ?  ?  ?  ? ? ?Social Determinants of Health (SDOH) Interventions ?  ? ?Readmission Risk Interventions ?   ? View : No data to display.  ?  ?  ?  ? ? ?

## 2022-02-21 LAB — CBC
HCT: 25.5 % — ABNORMAL LOW (ref 36.0–46.0)
Hemoglobin: 8.1 g/dL — ABNORMAL LOW (ref 12.0–15.0)
MCH: 27.2 pg (ref 26.0–34.0)
MCHC: 31.8 g/dL (ref 30.0–36.0)
MCV: 85.6 fL (ref 80.0–100.0)
Platelets: 346 10*3/uL (ref 150–400)
RBC: 2.98 MIL/uL — ABNORMAL LOW (ref 3.87–5.11)
RDW: 19.1 % — ABNORMAL HIGH (ref 11.5–15.5)
WBC: 8.5 10*3/uL (ref 4.0–10.5)
nRBC: 0 % (ref 0.0–0.2)

## 2022-02-21 NOTE — Progress Notes (Signed)
? ?HD#15 ?SUBJECTIVE:  ?Patient Summary: Isabel Barnes is a 81 y.o. with a pertinent PMH of previous cerebellar stroke, venous insufficiency, iron deficiency anemia, GERD, HTN, hypothyroidism, aortic stenosis, and ankylosing spondylitis, who presented with a fall and admitted for Afib likely secondary to acute cellulitis of the RLE  ? ?Overnight Events: No acute events overnight. ? ?Interim History: This is hospital day 15 for this patient who was seen and evaluated at the bedside this morning. She states that overnight she was having a lot of pain in her right leg because of the wrap, and this is better now that the wrap is cut off. She is tired this morning and does not feel too hungry.  ? ?OBJECTIVE:  ?Vital Signs: ?Vitals:  ? 02/20/22 2116 02/20/22 2126 02/20/22 2345 02/21/22 0411  ?BP: (!) 112/43 (!) 112/43 (!) 98/41 109/66  ?Pulse: 64 64 60 60  ?Resp: 16  16 20   ?Temp: 98.5 ?F (36.9 ?C)  98.7 ?F (37.1 ?C) 98.3 ?F (36.8 ?C)  ?TempSrc:   Oral Oral  ?SpO2:  99% 96% 99%  ?Weight:      ?Height:      ? ?Supplemental O2: Room Air ?SpO2: 99 % ?O2 Flow Rate (L/min): 2 L/min ? ?Filed Weights  ? 02/19/22 0458 02/19/22 2358 02/20/22 0500  ?Weight: 53.4 kg 57.5 kg 57.5 kg  ? ? ? ?Intake/Output Summary (Last 24 hours) at 02/21/2022 0630 ?Last data filed at 02/21/2022 0416 ?Gross per 24 hour  ?Intake 603 ml  ?Output 200 ml  ?Net 403 ml  ? ?Net IO Since Admission: 436.28 mL [02/21/22 0630] ? ?Physical Exam: ?General: Pleasant, thin-appearing elderly female laying in bed. No acute distress. ?CV: RRR. IV/VI holosystolic murmur.  ?Pulmonary: Lungs CTAB. Normal work of breathing on room air.  ?Abdominal: Soft, nontender, nondistended. Normal bowel sounds. ?Extremities: RLE wrapped loosely with wound VAC in place. 1+ edema bilateral lower extremities. ?Skin: Venous stasis dermatitis.  ?Neuro: A&Ox3. No focal deficit. ?Psych: Normal mood and affect ? ? ? ? ?ASSESSMENT/PLAN:  ?Assessment: ?Principal Problem: ?  Cellulitis of  right lower extremity ?Active Problems: ?  Aortic valve stenosis ?  Anemia ?  Fluid collection (edema) in the arms, legs, hands and feet ?  Fall ?  Atrial fibrillation ([place]) ?  Goals of care, counseling/discussion ?  Severe protein-calorie malnutrition ([place]) ?  PVD (peripheral vascular disease) ([place]) ?  Abscess of right lower leg ? ? ?Plan: ?#Cellulitis of right lower extremity ?#PAD of RLE ?Patient underwent excisional debridement and wound VAC placement with ortho on 3/15 and was found to have necrotic soft tissue. Surgical cultures initially grew E. Faecalis and patient was continued on vanc (day 11 of therapy). Flagyl was added on also, once cultures grew Bacteroides. Patient is also s/p excisional debridement with split thickness graft on 3/22. At this time, patient remains medically stable for discharge and is pending SNF placement.  ?- Continue Vanc, day 11 ?- Continue Flagyl, day 5/7 ?- Tylenol 1000 mg tid ?- Oxycodone 5 mg q4h prn for moderate-severe pain ?- IV morphine 1 mg q6h for breakthrough pain ?- Robaxin 500 mg q8h prn for muscle spasms  ?- Continue crestor 5 mg daily and aspirin 81 mg daily  ? ?#Thrush, improved ?- Continue nystatin oral suspension qid ? ?#GI bleed ?#Iron deficiency anemia ?#Constipation  ?Last BM was this morning with no melena or hematochezia noted. Hb stable at 8.1 today. Iron deficit calculated at ~936 mg and she is now s/p 750 mg  IV Ferrlecit.  ?- Protonix 40 mg daily ?- Senna and colace bid; miralax prn ?- Daily CBC ? ?#Afib, converted to NSR ?Remains in NSR and continued on amiodarone 200 mg bid.  ?  ?#Nasal congestion ?Continue nasacort inhaler daily  ?  ?#Severe malnutrition ?#Frailty  ?Patient is frail in appearance and has low albumin level at 1.5. Will continue vitamins as noted below, to promote wound healing and to improve overall nutritional status.  ?- Vitamin C 500 mg daily ?- Zinc 220 mg daily, day 10/14 ?- Ensure supplements tid ?  ?#Goals of care ?#Disposition  planning ?Patient's goal is to be discharged to SNF and social work has been assisting with this Patient is medically stable at this point and is pending SNF placement. [facility] a bed available for this patient on Monday- pending insurance authorization still. ? ?Best Practice: ?Diet: Regular diet ?IVF: Fluids: none ?VTE: enoxaparin (LOVENOX) injection 30 mg Start: 02/18/22 1315 ?SCDs Start: 02/17/22 1608 ?Code: DNR ?AB: Vanc, flagyl ?Therapy Recs: SNF ?DISPO: Anticipated discharge in 1-2 days to Skilled nursing facility pending  SNF placement . ? ?Signature: ?Buddy Duty, D.O.  ?Internal Medicine Resident, PGY-1 ?[facility]  ?Pager: [phone] ?6:30 AM, 02/21/2022  ? ?Please contact the on call pager after 5 pm and on weekends at [phone]. ? ?

## 2022-02-21 NOTE — Progress Notes (Signed)
Subjective: ?4 Days Post-Op Procedure(s) (LRB): ?RIGHT LEG SKIN GRAFT (Right) ?Patient reports pain as moderate.   ?States dressing still somewhat tight.  ? ?Objective: ?Vital signs in last 24 hours: ?Temp:  [97.6 ?F (36.4 ?C)-98.7 ?F (37.1 ?C)] 98.3 ?F (36.8 ?C) (03/26 0411) ?Pulse Rate:  [60-68] 60 (03/26 0411) ?Resp:  [12-20] 20 (03/26 0411) ?BP: (95-126)/(41-76) 109/66 (03/26 0411) ?SpO2:  [96 %-99 %] 99 % (03/26 0411) ?Weight:  [57.5 kg] 57.5 kg (03/26 0411) ? ?Intake/Output from previous day: ?03/25 0701 - 03/26 0700 ?In: 603 [P.O.:600; I.V.:3] ?Out: 200 [Urine:200] ?Intake/Output this shift: ?No intake/output data recorded. ? ?Recent Labs  ?  02/19/22 ?0211 02/20/22 ?0114 02/21/22 ?0703  ?HGB 8.2* 7.7* 8.1*  ? ?Recent Labs  ?  02/20/22 ?0114 02/21/22 ?0703  ?WBC 9.0 8.5  ?RBC 2.90* 2.98*  ?HCT 24.8* 25.5*  ?PLT 329 346  ? ?Recent Labs  ?  02/20/22 ?0114  ?NA 138  ?K 4.4  ?CL 107  ?CO2 28  ?BUN 14  ?CREATININE 0.66  ?GLUCOSE 89  ?CALCIUM 7.4*  ? ?No results for input(s): LABPT, INR in the last 72 hours. ? ?Incision: dressing C/D/I ?Compartment soft ?Able to wiggle toes ?Wound vac Canister with 200 cc ? ?Assessment/Plan: ?4 Days Post-Op Procedure(s) (LRB): ?RIGHT LEG SKIN GRAFT (Right) ?Weight bearing as tolerated right lower extremity ?ACE bandage applied loosely over right leg wound vac ?Encouraged elevation and wiggling of toes   ? ? ? ? ? ?Isabel Barnes ?02/21/2022, 7:49 AM ? ?

## 2022-02-22 ENCOUNTER — Inpatient Hospital Stay (HOSPITAL_COMMUNITY): Payer: Medicare Other

## 2022-02-22 DIAGNOSIS — D509 Iron deficiency anemia, unspecified: Secondary | ICD-10-CM

## 2022-02-22 LAB — CBC
HCT: 26.3 % — ABNORMAL LOW (ref 36.0–46.0)
Hemoglobin: 8.2 g/dL — ABNORMAL LOW (ref 12.0–15.0)
MCH: 26.9 pg (ref 26.0–34.0)
MCHC: 31.2 g/dL (ref 30.0–36.0)
MCV: 86.2 fL (ref 80.0–100.0)
Platelets: 372 10*3/uL (ref 150–400)
RBC: 3.05 MIL/uL — ABNORMAL LOW (ref 3.87–5.11)
RDW: 19.9 % — ABNORMAL HIGH (ref 11.5–15.5)
WBC: 8.2 10*3/uL (ref 4.0–10.5)
nRBC: 0 % (ref 0.0–0.2)

## 2022-02-22 MED ORDER — METRONIDAZOLE 500 MG PO TABS: 500.0000 mg | ORAL_TABLET | Freq: Two times a day (BID) | ORAL | 0 refills | Status: AC

## 2022-02-22 MED ORDER — FUROSEMIDE 10 MG/ML IJ SOLN
40.0000 mg | Freq: Once | INTRAMUSCULAR | Status: AC
Start: 2022-02-22 — End: 2022-02-22
  Administered 2022-02-22: 40 mg via INTRAVENOUS
  Filled 2022-02-22: qty 4

## 2022-02-22 MED ORDER — ACETAMINOPHEN 500 MG PO TABS: 1000.0000 mg | ORAL_TABLET | Freq: Three times a day (TID) | ORAL | 0 refills | Status: DC

## 2022-02-22 MED ORDER — PANTOPRAZOLE SODIUM 40 MG PO TBEC: 40.0000 mg | DELAYED_RELEASE_TABLET | Freq: Every day | ORAL | 0 refills | Status: DC

## 2022-02-22 MED ORDER — DOCUSATE SODIUM 100 MG PO CAPS
100.0000 mg | ORAL_CAPSULE | Freq: Two times a day (BID) | ORAL | 0 refills | Status: DC
Start: 2022-02-22 — End: 2022-03-23

## 2022-02-22 MED ORDER — ZINC SULFATE 220 (50 ZN) MG PO CAPS: 220.0000 mg | ORAL_CAPSULE | Freq: Every day | ORAL | 0 refills | Status: AC

## 2022-02-22 MED ORDER — AMIODARONE HCL 200 MG PO TABS: 200.0000 mg | ORAL_TABLET | Freq: Two times a day (BID) | ORAL | Status: DC

## 2022-02-22 MED ORDER — OXYCODONE HCL 5 MG PO TABS: 5.0000 mg | ORAL_TABLET | ORAL | 0 refills | Status: DC | PRN

## 2022-02-22 MED ORDER — POLYETHYLENE GLYCOL 3350 17 G PO PACK: 17.0000 g | PACK | Freq: Every day | ORAL | 0 refills | Status: DC | PRN

## 2022-02-22 MED ORDER — PANTOPRAZOLE SODIUM 40 MG PO TBEC: 40.0000 mg | DELAYED_RELEASE_TABLET | Freq: Two times a day (BID) | ORAL | 0 refills | Status: DC

## 2022-02-22 MED ORDER — OXYCODONE HCL 5 MG PO TABS: 5.0000 mg | ORAL_TABLET | ORAL | 0 refills | Status: AC | PRN

## 2022-02-22 MED ORDER — ASCORBIC ACID 500 MG PO TABS: 500.0000 mg | ORAL_TABLET | Freq: Every day | ORAL | 0 refills | Status: DC

## 2022-02-22 NOTE — Discharge Summary (Signed)
? ?Name: Isabel Barnes ?MRN: [id number] ?DOB: Jan 12, 1941 81 y.o. ?PCP: Jamal Collin, PA-C ? ?Date of Admission: 02/06/2022 11:52 AM ?Date of Discharge:  02/22/2022 ?Attending Physician: Dr. Cleda Daub ? ?DISCHARGE DIAGNOSIS:  ?Primary Problem: Cellulitis of right lower extremity  ? ?Hospital Problems: ?Principal Problem: ?  Cellulitis of right lower extremity ?Active Problems: ?  Aortic valve stenosis ?  Anemia ?  Fluid collection (edema) in the arms, legs, hands and feet ?  Fall ?  Atrial fibrillation ([facility]) ?  Goals of care, counseling/discussion ?  Severe protein-calorie malnutrition ([facility]) ?  PVD (peripheral vascular disease) ([facility]) ?  Abscess of right lower leg ?  ? ?DISCHARGE MEDICATIONS:  ? ?Allergies as of 02/22/2022   ? ?   Reactions  ? Dilaudid [hydromorphone Hcl] Shortness Of Breath  ? Gabapentin Other (See Comments)  ? Hoarseness , headache and sore throat  ? Latex Rash  ? Severe rash  ? Lyrica [pregabalin] Other (See Comments)  ? No balance , had to walk with cane , Blurred vision,weakness.  ? Oxycodone Shortness Of Breath, Other (See Comments)  ? Tolerated oxycodone during admission to hospital 02/06/22  ? Singulair [montelukast] Shortness Of Breath, Other (See Comments)  ? Vision issues, also  ? Ciprofloxacin Hcl Nausea And Vomiting, Other (See Comments)  ? Nausea and vomiting with by mouth form  ? Codeine Other (See Comments)  ? Hallucinations  ? Methadone Nausea And Vomiting, Other (See Comments)  ? Severe nausea and vomiting  ? Metronidazole Nausea And Vomiting, Other (See Comments)  ? Gastric pain  ? Oysters [shellfish Allergy] Other (See Comments)  ? "Terrible gastric upset and cramping."  ? Clindamycin/lincomycin Diarrhea, Nausea Only  ? Donepezil Diarrhea, Other (See Comments)  ? Severe diarrhea  ? Sulfa Antibiotics Diarrhea, Other (See Comments)  ? GI issues  ? Tape Other (See Comments)  ? Severe rash  ? Zetia [ezetimibe] Diarrhea  ? Elemental Sulfur Nausea And Vomiting  ? Iodine Rash  ?  Other Rash  ? All Antibiotic ointments/ creams  ? Oyster Shell Rash  ? Penicillin G Nausea And Vomiting, Rash  ? Penicillins Nausea And Vomiting, Rash  ? Has patient had a PCN reaction causing immediate rash, facial/tongue/throat swelling, SOB or lightheadedness with hypotension: Yes ?Has patient had a PCN reaction causing severe rash involving mucus membranes or skin necrosis: No ?Has patient had a PCN reaction that required hospitalization No ?Has patient had a PCN reaction occurring within the last 10 years: No ?If all of the above answers are "NO", then may proceed with Cephalosporin use.  ? Povidone Iodine Rash, Other (See Comments)  ? Oyster shell products- Rash  ? Skintegrity Hydrogel [skin Protectants, Misc.] Rash  ? Tapentadol Other (See Comments)  ? Nightmares  ? ?  ? ?  ?Medication List  ?  ? ?STOP taking these medications   ? ?HYDROcodone-acetaminophen 10-325 MG tablet ?Commonly known as: NORCO ?  ?omeprazole 20 MG capsule ?Commonly known as: PRILOSEC ?  ? ?  ? ?TAKE these medications   ? ?acetaminophen 500 MG tablet ?Commonly known as: TYLENOL ?Take 2 tablets (1,000 mg total) by mouth 3 (three) times daily. ?  ?albuterol 108 (90 Base) MCG/ACT inhaler ?Commonly known as: VENTOLIN HFA ?Inhale 2 puffs into the lungs every 4 (four) hours as needed for shortness of breath. ?  ?amiodarone 200 MG tablet ?Commonly known as: PACERONE ?Take 1 tablet (200 mg total) by mouth 2 (two) times daily. ?  ?ascorbic acid 500 MG  tablet ?Commonly known as: VITAMIN C ?Take 1 tablet (500 mg total) by mouth daily. ?  ?aspirin EC 81 MG tablet ?Take 81 mg by mouth daily. ?  ?carboxymethylcellulose 0.5 % Soln ?Commonly known as: REFRESH PLUS ?Place 1 drop into both eyes in the morning, at noon, and at bedtime. ?  ?cholecalciferol 1000 units tablet ?Commonly known as: VITAMIN D ?Take 1,000 Units by mouth 2 (two) times daily. ?  ?diclofenac sodium 1 % Gel ?Commonly known as: VOLTAREN ?Apply 2 g topically 2 (two) times daily as  needed (for pain). ?  ?diphenhydrAMINE 25 MG tablet ?Commonly known as: BENADRYL ?Take 25 mg by mouth every 6 (six) hours as needed for allergies. ?  ?docusate sodium 100 MG capsule ?Commonly known as: COLACE ?Take 1 capsule (100 mg total) by mouth 2 (two) times daily. ?  ?Ensure Plus Liqd ?Take 237 mLs by mouth daily as needed (for supplementation). ?  ?ENZYME DIGEST PO ?Take 1 tablet by mouth daily as needed (For digestive). ?  ?fish oil-omega-3 fatty acids 1000 MG capsule ?Take 1,000 mg by mouth 2 (two) times daily. ?  ?furosemide 20 MG tablet ?Commonly known as: LASIX ?Take 20 mg by mouth daily as needed for fluid. ?  ?levothyroxine 50 MCG tablet ?Commonly known as: SYNTHROID ?Take 50 mcg by mouth every morning. ?  ?loperamide 2 MG tablet ?Commonly known as: IMODIUM A-D ?Take 4 mg by mouth 3 (three) times daily as needed for diarrhea or loose stools. ?  ?losartan 25 MG tablet ?Commonly known as: COZAAR ?Take 25 mg by mouth daily. ?  ?magnesium 30 MG tablet ?Take 30 mg by mouth daily. ?  ?methocarbamol 500 MG tablet ?Commonly known as: ROBAXIN ?Take 500 mg by mouth 3 (three) times daily as needed for muscle spasms. ?  ?metroNIDAZOLE 500 MG tablet ?Commonly known as: FLAGYL ?Take 1 tablet (500 mg total) by mouth every 12 (twelve) hours for 2 days. ?  ?MULTIVITAMIN PO ?Take 1 tablet by mouth daily. ?  ?ondansetron 8 MG tablet ?Commonly known as: Zofran ?Take 1 tablet (8 mg total) by mouth every 8 (eight) hours as needed for nausea or vomiting. ?  ?oxyCODONE 5 MG immediate release tablet ?Commonly known as: Oxy IR/ROXICODONE ?Take 1 tablet (5 mg total) by mouth every 4 (four) hours as needed for up to 5 days for severe pain or moderate pain. ?  ?pantoprazole 40 MG tablet ?Commonly known as: Protonix ?Take 1 tablet (40 mg total) by mouth 2 (two) times daily for 14 days. ?  ?pantoprazole 40 MG tablet ?Commonly known as: PROTONIX ?Take 1 tablet (40 mg total) by mouth daily. ?Start taking on: March 09, 2022 ?   ?polyethylene glycol 17 g packet ?Commonly known as: MIRALAX / GLYCOLAX ?Take 17 g by mouth daily as needed for moderate constipation or mild constipation. ?  ?POTASSIUM PO ?Take 1 tablet by mouth daily. ?  ?predniSONE 5 MG tablet ?Commonly known as: DELTASONE ?Take 5 mg by mouth daily with breakfast. ?  ?PROBIOTIC DAILY PO ?Take 1 capsule by mouth daily. ?  ?Restasis 0.05 % ophthalmic emulsion ?Generic drug: cycloSPORINE ?Place 1 drop into both eyes daily. ?  ?rosuvastatin 5 MG tablet ?Commonly known as: CRESTOR ?Take 5 mg by mouth daily. ?  ?triamcinolone 55 MCG/ACT Aero nasal inhaler ?Commonly known as: NASACORT ?Place 1-2 sprays into the nose 2 (two) times daily as needed (for seasonal allergies). ?  ?Turmeric 500 MG Caps ?Take 500 mg by mouth at bedtime. ?  ?  vitamin B-12 250 MCG tablet ?Commonly known as: CYANOCOBALAMIN ?Take 250 mcg by mouth at bedtime. ?  ?zinc sulfate 220 (50 Zn) MG capsule ?Take 1 capsule (220 mg total) by mouth daily for 4 days. ?  ? ?  ? ?  ?  ? ? ?  ?Durable Medical Equipment  ?(From admission, onward)  ?  ? ? ?  ? ?  Start     Ordered  ? 02/22/22 1111  DME 3-in-1  Once       ? 02/22/22 1114  ? ?  ?  ? ?  ? ? ?  ?Discharge Care Instructions  ?(From admission, onward)  ?  ? ? ?  ? ?  Start     Ordered  ? 02/22/22 0000  Discharge wound care:       ?Comments: Weight bearing as tolerated right lower extremity ?ACE bandage applied loosely over right leg wound vac; per SNF  ? 02/22/22 1114  ? ?  ?  ? ?  ? ? ?DISPOSITION AND FOLLOW-UP:  ?Ms.Clayburn Pertatricia W Vanderstelt [facility] [place] [facility]. At the hospital follow up visit please address: ? ?RLE cellulitis: Patient needs to follow up with ortho in ~1 week regarding wound VAC. She completed her course of vancomycin and will need 2 more days of flagyl bid. Oxycodone q4h prn for pain. Weight bearing as tolerated for RLE cellulitis. ? ?Afib: Initially in Afib and converted to NSR - started on amiodarone 200 mg  bid. Recommend follow up with her outpatient cardiologist ? ?GI bleed/Iron deficiency: Hb declined during hospitalization likely secondary to GI bleed however she declined any endoscopy. Started on protonix 40 mg bid x 30

## 2022-02-22 NOTE — TOC Progression Note (Signed)
Transition of Care (TOC) - Progression Note  ? ? ?Patient Details  ?Name: Isabel Barnes ?MRN: [id number] ?Date of Birth: [birth year] ? ?Transition of Care (TOC) CM/SW Contact  ?Eduard Roux, LCSW ?Phone Number: ?02/22/2022, 8:39 AM ? ?Clinical Narrative:    ? ?Insurance remains pending  ? ?Expected Discharge Plan: Skilled Nursing Facility ?Barriers to Discharge: English as a second language teacher, Continued Medical Work up, SNF Pending bed offer ? ?Expected Discharge Plan and Services ?Expected Discharge Plan: Skilled Nursing Facility ?In-house Referral: Clinical Social Work ?  ?  ?Living arrangements for the past 2 months: Single Family Home ?                ?  ?  ?  ?  ?  ?  ?  ?  ?  ?  ? ? ?Social Determinants of Health (SDOH) Interventions ?  ? ?Readmission Risk Interventions ?   ? View : No data to display.  ?  ?  ?  ? ? ?

## 2022-02-22 NOTE — [place] FL2 (Signed)
?[place] MEDICAID FL2 LEVEL OF CARE SCREENING TOOL  ?  ? ?IDENTIFICATION  ?Patient Name: ?Isabel Barnes Birthdate: 05/23/1941 Sex: female Admission Date (Current Location): ?02/06/2022  ?[place] and [place] Number: ? [place] ?  Facility and Address:  ?[facility], [place] [address], [place], [place] [address] ?     Provider Number: ?[id number]  ?Attending Physician Name and Address:  ?Lucious Groves, DO ? Relative Name and Phone Number:  ?  ?   ?Current Level of Care: ?Hospital Recommended Level of Care: ?[place] Prior Approval Number: ?  ? ?Date Approved/Denied: ?  PASRR Number: ?[id number] ? ?Discharge Plan: ?SNF ?  ? ?Current Diagnoses: ?Patient Active Problem List  ? Diagnosis Date Noted  ? Abscess of right lower leg   ? Severe protein-calorie malnutrition ([place])   ? PVD (peripheral vascular disease) ([place])   ? Fall   ? Atrial fibrillation ([place])   ? Goals of care, counseling/discussion   ? Anemia   ? Fluid collection (edema) in the arms, legs, hands and feet   ? Cellulitis of right lower extremity 02/06/2022  ? Chest pain, rule out acute myocardial infarction 03/16/2021  ? HTN (hypertension) 03/16/2021  ? Hypothyroidism 03/16/2021  ? HLD (hyperlipidemia) 03/16/2021  ? Aortic valve stenosis 03/16/2021  ? Chronic venous insufficiency of lower extremity 03/16/2021  ? Short-term memory loss 12/13/2019  ? Psoriatic arthritis ([place]) 12/13/2019  ? Primary osteoarthritis of right knee 07/16/2016  ? Eosinophilia 06/16/2015  ? Chronic pain disorder   ? Osteoarthritis   ? Incisional umbilical hernia, without obstruction or gangrene   ? Iron deficiency anemia 09/19/2012  ? ? ?Orientation RESPIRATION BLADDER Height & Weight   ?  ?Self, Time, Situation, Place ? Normal External catheter, Incontinent Weight: 126 lb 12.2 oz (57.5 kg) ?Height:  5' (152.4 cm)  ?BEHAVIORAL SYMPTOMS/MOOD NEUROLOGICAL BOWEL NUTRITION STATUS  ?      Diet (please see discharge summary)  ?AMBULATORY STATUS  COMMUNICATION OF NEEDS Skin   ?Supervision Verbally Surgical wounds (wound/incision, nonpressure wound,pretibial right;left and  leg right full thickness wound) ?  ?  ?  ?    ?     ?     ? ? ?Personal Care Assistance Level of Assistance  ?Bathing, Feeding, Dressing Bathing Assistance: Limited assistance ?Feeding assistance: Independent ?Dressing Assistance: Limited assistance ?   ? ?Functional Limitations Info  ?Sight, Hearing, Speech Sight Info: Impaired ?Hearing Info: Adequate ?Speech Info: Adequate  ? ? ?SPECIAL CARE FACTORS FREQUENCY  ?PT (By licensed PT), OT (By licensed OT)   ?  ?PT Frequency: 5x per week ?OT Frequency: 5x per week ?  ?  ?  ?   ? ? ?Contractures Contractures Info: Not present  ? ? ?Additional Factors Info  ?Code Status, Allergies Code Status Info: DNR ?Allergies Info: 25 allergies- please see discharge summary ?  ?  ?  ?   ? ?Current Medications (02/22/2022):  This is the current hospital active medication list ?Current Facility-Administered Medications  ?Medication Dose Route Frequency Provider Last Rate Last Admin  ? 0.9 %  sodium chloride infusion (Manually program via Guardrails IV Fluids)   Intravenous Once Newt Minion, MD      ? 0.9 %  sodium chloride infusion  250 mL Intravenous PRN Newt Minion, MD      ? 0.9 %  sodium chloride infusion   Intravenous Continuous Newt Minion, MD   Stopped at 02/18/22 1748  ? acetaminophen (TYLENOL)  tablet 1,000 mg  1,000 mg Oral TID Newt Minion, MD   1,000 mg at 02/22/22 0827  ? acetaminophen (TYLENOL) tablet 650 mg  650 mg Oral Q4H PRN Newt Minion, MD   650 mg at 02/21/22 0234  ? amiodarone (PACERONE) tablet 200 mg  200 mg Oral BID Newt Minion, MD   200 mg at 02/22/22 0827  ? ascorbic acid (VITAMIN C) tablet 500 mg  500 mg Oral Daily Newt Minion, MD   500 mg at 02/22/22 0827  ? aspirin EC tablet 81 mg  81 mg Oral Daily Newt Minion, MD   81 mg at 02/22/22 0827  ? bisacodyl (DULCOLAX) suppository 10 mg  10 mg Rectal Daily PRN Newt Minion, MD      ? cycloSPORINE (RESTASIS) 0.05 % ophthalmic emulsion 1 drop  1 drop Both Eyes Daily Newt Minion, MD   1 drop at 02/22/22 [id number]  ? docusate sodium (COLACE) capsule 100 mg  100 mg Oral BID Newt Minion, MD   100 mg at 02/22/22 0827  ? enoxaparin (LOVENOX) injection 30 mg  30 mg Subcutaneous Q24H Atway, Rayann N, DO   30 mg at 02/21/22 1438  ? feeding supplement (ENSURE ENLIVE / ENSURE PLUS) liquid 237 mL  237 mL Oral Q24H Lucious Groves, DO   237 mL at 02/20/22 2044  ? hydrALAZINE (APRESOLINE) injection 5 mg  5 mg Intravenous Q20 Min PRN Newt Minion, MD      ? hydrocortisone 1 % ointment   Topical BID PRN Newt Minion, MD      ? labetalol (NORMODYNE) injection 10 mg  10 mg Intravenous Q10 min PRN Newt Minion, MD      ? menthol-cetylpyridinium (CEPACOL) lozenge 3 mg  1 lozenge Oral PRN Newt Minion, MD   3 mg at 02/18/22 2212  ? methocarbamol (ROBAXIN) tablet 500 mg  500 mg Oral Q6H PRN Newt Minion, MD   500 mg at 02/22/22 [id number]  ? Or  ? methocarbamol (ROBAXIN) 500 mg in dextrose 5 % 50 mL IVPB  500 mg Intravenous Q6H PRN Newt Minion, MD      ? metoCLOPramide (REGLAN) tablet 5 mg  5 mg Oral Q8H PRN Newt Minion, MD      ? Or  ? metoCLOPramide (REGLAN) injection 5 mg  5 mg Intravenous Q8H PRN Newt Minion, MD      ? metroNIDAZOLE (FLAGYL) tablet 500 mg  500 mg Oral Q12H Joni Reining C, DO   500 mg at 02/22/22 [id number]  ? morphine (PF) 2 MG/ML injection 1 mg  1 mg Intravenous Q6H PRN Atway, Rayann N, DO   1 mg at 02/21/22 2052  ? multivitamin with minerals tablet 1 tablet  1 tablet Oral Daily Newt Minion, MD   1 tablet at 02/22/22 0827  ? nystatin (MYCOSTATIN) 100000 UNIT/ML suspension 500,000 Units  5 mL Oral QID Newt Minion, MD   500,000 Units at 02/22/22 0827  ? ondansetron (ZOFRAN) tablet 4 mg  4 mg Oral Q6H PRN Newt Minion, MD      ? Or  ? ondansetron [facility]) injection 4 mg  4 mg Intravenous Q6H PRN Newt Minion, MD   4 mg at 02/21/22 1438  ? oxyCODONE (Oxy  IR/ROXICODONE) immediate release tablet 5 mg  5 mg Oral Q4H PRN Newt Minion, MD   5 mg at 02/22/22 [id number]  ? pantoprazole (  PROTONIX) EC tablet 40 mg  40 mg Oral BID AC Newt Minion, MD   40 mg at 02/22/22 0827  ? phenol (CHLORASEPTIC) mouth spray 1 spray  1 spray Mouth/Throat PRN Newt Minion, MD   1 spray at 02/08/22 2135  ? polyethylene glycol (MIRALAX / GLYCOLAX) packet 17 g  17 g Oral Once Newt Minion, MD      ? polyethylene glycol (MIRALAX / GLYCOLAX) packet 17 g  17 g Oral Daily PRN Newt Minion, MD   17 g at 02/20/22 [id number]  ? predniSONE (DELTASONE) tablet 5 mg  5 mg Oral Q breakfast Newt Minion, MD   5 mg at 02/22/22 0827  ? rosuvastatin (CRESTOR) tablet 5 mg  5 mg Oral Daily Newt Minion, MD   5 mg at 02/22/22 [id number]  ? senna-docusate (Senokot-S) tablet 1 tablet  1 tablet Oral BID Newt Minion, MD   1 tablet at 02/22/22 0827  ? sodium chloride (OCEAN) 0.65 % nasal spray 1 spray  1 spray Each Nare PRN Newt Minion, MD   1 spray at 02/16/22 1013  ? sodium chloride flush (NS) 0.9 % injection 3 mL  3 mL Intravenous Q12H Newt Minion, MD   3 mL at 02/22/22 [id number]  ? sodium chloride flush (NS) 0.9 % injection 3 mL  3 mL Intravenous PRN Newt Minion, MD      ? triamcinolone (NASACORT) nasal inhaler 1 spray  1 spray Each Nare Daily Farrel Gordon, DO   1 spray at 02/22/22 [id number]  ? vancomycin (VANCOREADY) IVPB 1250 mg/250 mL  1,250 mg Intravenous Q48H Newt Minion, MD 166.7 mL/hr at 02/22/22 0839 1,250 mg at 02/22/22 0839  ? white petrolatum (VASELINE) gel   Topical PRN Newt Minion, MD      ? zinc sulfate capsule 220 mg  220 mg Oral Daily Newt Minion, MD   220 mg at 02/22/22 0827  ? ? ? ?Discharge Medications: ?Please see discharge summary for a list of discharge medications. ? ?Relevant Imaging Results: ? ?Relevant Lab Results: ? ? ?Additional Information ?SSN [id number]  Moderna COVID-19 Vaccine 07/22/2021   Pfizer COVID-19 Vaccine 01/03/2020 , 12/16/2019  will need outpatient palliative care to  follow at SNF ? ?Vinie Sill, LCSW ? ? ? ? ?

## 2022-02-22 NOTE — TOC Transition Note (Signed)
Transition of Care (TOC) - CM/SW Discharge Note ? ? ?Patient Details  ?Name: Isabel Barnes ?MRN: [id number] ?Date of Birth: 1941-06-25 ? ?Transition of Care (TOC) CM/SW Contact:  ?Eduard Roux, LCSW ?Phone Number: ?02/22/2022, 12:46 PM ? ? ?Clinical Narrative:    ? ?Patient will Discharge to: [facility]  ?Discharge Date: 02/22/2022 ?Family Notified: Mark,son ?Transport [id number] ? ?Per MD patient is ready for discharge. RN, patient, and facility notified of discharge. Discharge Summary sent to facility. RN given number for [phone], Room 307. Ambulance transport requested for patient.  ? ?Clinical Social Worker signing off. ? ?Antony Blackbird, MSW, LCSW ?Clinical Social Worker ? ? ? ?Final next level of care: Skilled Nursing Facility ?Barriers to Discharge: Barriers Resolved ? ? ?Patient Goals and CMS Choice ?  ?  ?  ? ?Discharge Placement ?  ?           ?Patient chooses bed at: [facility] ?Patient to be transferred to facility by: [facility] ?Name of family member notified: son ?Patient and family notified of of transfer: 02/22/22 ? ?Discharge Plan and Services ?In-house Referral: Clinical Social Work ?  ?           ?  ?  ?  ?  ?  ?  ?  ?  ?  ?  ? ?Social Determinants of Health (SDOH) Interventions ?  ? ? ?Readmission Risk Interventions ?   ? View : No data to display.  ?  ?  ?  ? ? ? ? ? ?

## 2022-02-23 ENCOUNTER — Non-Acute Institutional Stay (SKILLED_NURSING_FACILITY): Payer: Medicare Other | Admitting: Internal Medicine

## 2022-02-23 ENCOUNTER — Encounter: Payer: Self-pay | Admitting: Internal Medicine

## 2022-02-23 DIAGNOSIS — D500 Iron deficiency anemia secondary to blood loss (chronic): Secondary | ICD-10-CM

## 2022-02-23 DIAGNOSIS — I480 Paroxysmal atrial fibrillation: Secondary | ICD-10-CM | POA: Diagnosis not present

## 2022-02-23 DIAGNOSIS — L03115 Cellulitis of right lower limb: Secondary | ICD-10-CM

## 2022-02-23 DIAGNOSIS — E43 Unspecified severe protein-calorie malnutrition: Secondary | ICD-10-CM

## 2022-02-23 DIAGNOSIS — I739 Peripheral vascular disease, unspecified: Secondary | ICD-10-CM | POA: Diagnosis not present

## 2022-02-23 NOTE — Assessment & Plan Note (Addendum)
Wound Care Nurse to monitor wound VAC at SNF ?

## 2022-02-23 NOTE — Progress Notes (Signed)
? ?NURSING HOME LOCATION:  [place] ?ROOM NUMBER:  [id number] ? ?CODE STATUS:  DNR ? ?PCP:  Ardith Dark PA-C ? ?This is a comprehensive admission note to this SNFperformed on this date less than 30 days from date of admission. ?Included are preadmission medical/surgical history; reconciled medication list; family history; social history and comprehensive review of systems.  ?Corrections and additions to the records were documented. Comprehensive physical exam was also performed. Additionally a clinical summary was entered for each active diagnosis pertinent to this admission in the Problem List to enhance continuity of care. ? ?HPI: Patient was hospitalized 3/11 - 02/22/2022 with cellulitis of the right lower extremity.  She presented after a fall at home.  Large ecchymotic area was noted on the right lower extremity with an area of palpable fluctuance with associated significant pain.  Ultrasound revealed extensive subcu edema with definite fluid collection without evidence of DVT.Empirically she was started on vancomycin and Rocephin IV. ?On 3/15 excisional debridement of the right lower extremity was performed by Orthopedist Dr Sharol Given. Massive necrotic soft tissue collection was documented.  Wound VAC was placed.  Cultures grew Enterococcus faecalis ; therefore, Rocephin was discontinued.  Subsequently cultures grew Bacteroides and metronidazole was added to her regimen. 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  No intervention was pursued as the patient had inline flow via the peroneal artery.  On 3/22 excisional debridement of skin and soft tissue and split thickness skin graft was performed by Dr Sharol Given. ?As needed oxycodone and morphine for breakthrough pain were continued. ?Initially patient had A-fib with rapid ventricular response with ventricular rate in the 150s which converted to normal sinus rhythm.  TSH was 4.449, therapeutic.  Amiodarone  200 mg twice daily was initiated by Cardiology. This was in the setting of moderate-severe aortic stenosis. ?She also presented with weight loss, melenous stools, and gingival bleeding.  Von Willebrand was considered but the panel was negative.   ?Severe protein caloric malnutrition was suggested by her frail appearance and albumin level  of 1.5. ?CEA and CA 19-9 were normal. ?Hemoglobin dropped during the hospitalization; nadir H/H 7.7/24.8.  Clinically this was attributed to possible GI bleed.  Patient declined endoscopy.  Protonix 40 mg twice daily was initiated x30 days with subsequent 40 mg daily dose thereafter.  She did receive 750 mg of Ferrlecit IV as iron deficit was calculated to be 936 mg. ?Zinc and vitamin C supplementation was initiated for malnutrition.  Additionally Ensure Enlive supplements were administered. ?Labs pre discharge revealed a calcium of 7.4 and creatinine of 0.66 with GFR greater than 60.  H/H was 8.1/25.5 ?Patient desired to pursue conservative measures and declined further evaluation of the weight loss and bleeding dyscrasias.  Palliative care consulted.  Her main concern is pain control.  DNR was in place. ?At discharge she had completed the full course of vancomycin and was to continue metronidazole for 2 additional days.  Oxycodone was prescribed every 4 hours as needed for pain.  Weightbearing as tolerated on the RLE was recommended with PT/OT at the SNF. ? ?Past medical and surgical history: Includes chronic pain disorder, chronic depression, GERD, history of nephrolithiasis, essential hypertension, hypothyroidism, and history of spondylitis. ?Surgeries and procedures include abdominal hysterectomy, anterior fusion of cervical spine, laparoscopic cholecystectomy, and multiple orthopedic surgeries. ? ?Social history: Nondrinker; insignificant smoking history.  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 ? ?Family  history: Family history is noncontributory due to advanced age. ?  ?Review of systems: She states that the right lower extremity is "quite painful" which she rates as an 8 out of 10.  She also has chronic low back pain as well as chronic left hip pain.  Her orthopedist has prescribed low-dose prednisone to help with the arthritic pain.  She has been on this for approximately 1 year. ?She had been taking muscle relaxants to aid sleep. ?She denies any active GI symptoms except for occasional dysphagia with bread or rice.  She denies any melena. ? ?Constitutional: No fever, significant weight change  ?Eyes: No redness, discharge, pain, vision change ?ENT/mouth: No nasal congestion, purulent discharge, earache, change in hearing, sore throat  ?Cardiovascular: No chest pain, palpitations, paroxysmal nocturnal dyspnea, claudication, edema  ?Respiratory: No cough, sputum production, hemoptysis, DOE, significant snoring, apnea Gastrointestinal: No heartburn,abdominal pain, nausea /vomiting, rectal bleeding ?Genitourinary: No dysuria, hematuria, pyuria, incontinence, nocturia ?Dermatologic: No rash, pruritus, change in appearance of skin ?Neurologic: No dizziness, headache, syncope, seizures, numbness, tingling ?Psychiatric: No significant anxiety, depression, anorexia ?Endocrine: No change in hair/skin/nails, excessive thirst, excessive hunger, excessive urination  ?Hematologic/lymphatic: No significant bruising, lymphadenopathy, abnormal bleeding ?Allergy/immunology: No itchy/watery eyes, significant sneezing, urticaria, angioedema ? ?Physical exam:  ?Pertinent or positive findings: She is thin and appears somewhat cachectic.  She appears her stated age.  Eyebrows are absent.  See exhibits an intermittent gasping type respiratory pattern as if "gulping air".  She has a grade 2-2.5 harsh blowing systolic murmur throughout the precordium.  Breath sounds are decreased.  Pedal pulses are decreased.  The right lower extremity  is wrapped below the knee.  The right knee is fusiform shaped with possible effusion.  She has rough, keratotic hyperpigmentation over the left shin. ? ?General appearance:  no acute distress, increased work of breathing is present.   ?Lymphatic: No lymphadenopathy about the head, neck, axilla. ?Eyes: No conjunctival inflammation or lid edema is present. There is no scleral icterus. ?Ears:  External ear exam shows no significant lesions or deformities.   ?Nose:  External nasal examination shows no deformity or inflammation. Nasal mucosa are pink and moist without lesions, exudates ?Oral exam: Lips and gums are healthy appearing.There is no oropharyngeal erythema or exudate. ?Neck:  No thyromegaly, masses, tenderness noted.    ?Heart:  Normal rate and regular rhythm. S1 and S2 normal without gallop, click, rub.  ?Lungs: without wheezes, rhonchi, rales, rubs. ?Abdomen: Bowel sounds are normal.  Abdomen is soft and nontender with no organomegaly, hernias, masses. ?GU: Deferred  ?Extremities:  No cyanosis, clubbing. ?Neurologic exam: Balance, Rhomberg, finger to nose testing could not be completed due to clinical state ?Skin: Warm & dry w/o tenting. ? ?See clinical summary under each active problem in the Problem List with associated updated therapeutic plan ? ?

## 2022-02-23 NOTE — Patient Instructions (Signed)
See assessment and plan under each diagnosis in the problem list and acutely for this visit 

## 2022-02-23 NOTE — Assessment & Plan Note (Addendum)
Albumin 1.5; cachectic appearance.  Palliative care consulted. ?Nutritionist will monitor her @ SNF ?

## 2022-02-24 ENCOUNTER — Encounter: Payer: Medicare Other | Admitting: Family

## 2022-02-24 NOTE — Assessment & Plan Note (Signed)
No intervention as inline flow via the peroneal artery. ? ?

## 2022-02-24 NOTE — Assessment & Plan Note (Signed)
Regular rhythm & controlled rate.AF not present. ?

## 2022-02-24 NOTE — Assessment & Plan Note (Signed)
No melena or active GI symptoms reported. ?

## 2022-02-25 ENCOUNTER — Non-Acute Institutional Stay (SKILLED_NURSING_FACILITY): Payer: Medicare Other | Admitting: Adult Health

## 2022-02-25 ENCOUNTER — Other Ambulatory Visit: Payer: Self-pay

## 2022-02-25 ENCOUNTER — Telehealth: Payer: Self-pay | Admitting: Orthopedic Surgery

## 2022-02-25 ENCOUNTER — Encounter: Payer: Self-pay | Admitting: Adult Health

## 2022-02-25 ENCOUNTER — Emergency Department (HOSPITAL_COMMUNITY)
Admission: EM | Admit: 2022-02-25 | Discharge: 2022-02-25 | Disposition: A | Discharge status: Home / Self Care | Payer: Medicare Other | Attending: Emergency Medicine | Admitting: Emergency Medicine

## 2022-02-25 DIAGNOSIS — L03115 Cellulitis of right lower limb: Secondary | ICD-10-CM | POA: Diagnosis not present

## 2022-02-25 DIAGNOSIS — Z79899 Other long term (current) drug therapy: Secondary | ICD-10-CM | POA: Diagnosis not present

## 2022-02-25 DIAGNOSIS — D500 Iron deficiency anemia secondary to blood loss (chronic): Secondary | ICD-10-CM

## 2022-02-25 DIAGNOSIS — E039 Hypothyroidism, unspecified: Secondary | ICD-10-CM | POA: Insufficient documentation

## 2022-02-25 DIAGNOSIS — R600 Localized edema: Secondary | ICD-10-CM | POA: Diagnosis not present

## 2022-02-25 DIAGNOSIS — T148XXA Other injury of unspecified body region, initial encounter: Secondary | ICD-10-CM

## 2022-02-25 DIAGNOSIS — Z7982 Long term (current) use of aspirin: Secondary | ICD-10-CM | POA: Diagnosis not present

## 2022-02-25 DIAGNOSIS — Z4801 Encounter for change or removal of surgical wound dressing: Secondary | ICD-10-CM | POA: Insufficient documentation

## 2022-02-25 DIAGNOSIS — X58XXXD Exposure to other specified factors, subsequent encounter: Secondary | ICD-10-CM | POA: Diagnosis not present

## 2022-02-25 DIAGNOSIS — I10 Essential (primary) hypertension: Secondary | ICD-10-CM | POA: Diagnosis not present

## 2022-02-25 DIAGNOSIS — E43 Unspecified severe protein-calorie malnutrition: Secondary | ICD-10-CM | POA: Diagnosis not present

## 2022-02-25 DIAGNOSIS — S81801D Unspecified open wound, right lower leg, subsequent encounter: Secondary | ICD-10-CM | POA: Diagnosis not present

## 2022-02-25 DIAGNOSIS — Z91040 Latex allergy status: Secondary | ICD-10-CM | POA: Insufficient documentation

## 2022-02-25 DIAGNOSIS — I480 Paroxysmal atrial fibrillation: Secondary | ICD-10-CM | POA: Diagnosis not present

## 2022-02-25 DIAGNOSIS — Z5189 Encounter for other specified aftercare: Secondary | ICD-10-CM

## 2022-02-25 DIAGNOSIS — G894 Chronic pain syndrome: Secondary | ICD-10-CM

## 2022-02-25 LAB — BASIC METABOLIC PANEL
Anion gap: 5 (ref 5–15)
BUN: 17 mg/dL (ref 8–23)
CO2: 26 mmol/L (ref 22–32)
Calcium: 7.6 mg/dL — ABNORMAL LOW (ref 8.9–10.3)
Chloride: 107 mmol/L (ref 98–111)
Creatinine, Ser: 0.73 mg/dL (ref 0.44–1.00)
GFR, Estimated: >60 mL/min (ref >60)
Glucose, Bld: 128 mg/dL — ABNORMAL HIGH (ref 70–99)
Potassium: 4.4 mmol/L (ref 3.5–5.1)
Sodium: 138 mmol/L (ref 135–145)

## 2022-02-25 LAB — CBC WITH DIFFERENTIAL/PLATELET
Abs Immature Granulocytes: 0.15 10*3/uL — ABNORMAL HIGH (ref 0.00–0.07)
Basophils Absolute: 0.1 10*3/uL (ref 0.0–0.1)
Basophils Relative: 1 %
Eosinophils Absolute: 0.5 10*3/uL (ref 0.0–0.5)
Eosinophils Relative: 5 %
HCT: 26.7 % — ABNORMAL LOW (ref 36.0–46.0)
Hemoglobin: 8.2 g/dL — ABNORMAL LOW (ref 12.0–15.0)
Immature Granulocytes: 2 %
Lymphocytes Relative: 21 %
Lymphs Abs: 2.1 10*3/uL (ref 0.7–4.0)
MCH: 27.5 pg (ref 26.0–34.0)
MCHC: 30.7 g/dL (ref 30.0–36.0)
MCV: 89.6 fL (ref 80.0–100.0)
Monocytes Absolute: 0.9 10*3/uL (ref 0.1–1.0)
Monocytes Relative: 9 %
Neutro Abs: 6.3 10*3/uL (ref 1.7–7.7)
Neutrophils Relative %: 62 %
Platelets: 375 10*3/uL (ref 150–400)
RBC: 2.98 MIL/uL — ABNORMAL LOW (ref 3.87–5.11)
RDW: 21.5 % — ABNORMAL HIGH (ref 11.5–15.5)
WBC: 10 10*3/uL (ref 4.0–10.5)
nRBC: 0 % (ref 0.0–0.2)

## 2022-02-25 MED ORDER — OXYCODONE HCL 5 MG PO TABS
5.0000 mg | ORAL_TABLET | Freq: Once | ORAL | Status: AC
  Administered 2022-02-25: 5 mg via ORAL
  Filled 2022-02-25: qty 1

## 2022-02-25 NOTE — Progress Notes (Signed)
? ?Location:  [facility] ?Nursing Home Room Number: [id number] ?Place of Service:  SNF (31) ?Provider:  Durenda Age, DNP, FNP-BC ? ?Patient Care Team: ?Sheral Apley as PCP - General (Physician Assistant) ?Wellington Hampshire, MD as PCP - Cardiology (Cardiology) ?Hedgecock, Jillene Bucks as Librarian, academic (Physician Environmental consultant) ? ?Extended Emergency Contact Information ?Primary Emergency Contact: Lane,Mark ?Work Phone: [phone] ?Mobile Phone: [phone] ?Relation: Son ?Secondary Emergency Contact: Lee,Deanna ?Mobile Phone: [phone] ?Relation: Neighbor ?Interpreter needed? No ? ?Code Status:  Full Code ? ?Goals of care: Advanced Directive information ? ?  02/25/2022  ? 10:46 AM  ?Advanced Directives  ?Does Patient Have a Medical Advance Directive? No  ?Does patient want to make changes to medical advance directive? No - Patient declined  ? ? ? ?Chief Complaint  ?Patient presents with  ? Acute Visit  ?  Follow up ED Visit  ? ? ?HPI:  ?Pt is a 81 y.o. female seen today for follow up ED visit. She was having short-term rehabilitation post hospitalization 02/06/22 to 02/22/22 for cellulitis of right lower extremity. She completed course of Vancomycin and Flagyl. She initially presented with atrial fibrillation and converted to NSR. She was started on Amiodarone 200 mg BID. Her hgb decreased during hospitalization and was thought to be due to GI bleed but declined endoscopy. She was started on Protonix 40 mg BID X 30 days then 40 mg daily. She received IV Ferrlicit 0000000 mg. ? ?She was admitted to [facility] on 02/22/22 but was transferred to ED on 02/25/22 due to wound vac came off. In the ED right lower leg bandaged was removed and replaced. There was no noted infection. ? ?She has a PMH of chronic pain, ankylosis spondylitis, cerebellar stroke, iron deficiency anemia, GERD, hypertension, hypothyroidism, aortic stenosis, venous insufficiency and  osteoarthritis. ? ? ?Past Medical History:  ?Diagnosis Date  ? Anemia   ? iron deficiency hx.has had iron infusions before   ? Chronic low back pain   ? Chronic pain disorder   ? Complication of anesthesia   ? severe claustrophobia  ? Constipation   ? r/t use of pain meds.Takes OTC meds or eats prunes  ? Depression   ? GERD (gastroesophageal reflux disease)   ? takes Omeprazole daily  ? Heart murmur   ? mild MS, moderate-severe AS 03/17/21 echo  ? History of bronchitis   ? 20+ yrs ago  ? History of kidney stones   ? 3 surgerical removed, 1 passed  ? History of prolapse of bladder   ? History of shingles   ? Hypertension   ? takes Losartan daily  ? Hypothyroidism   ? takes Synthroid daily  ? Joint swelling   ? Neck pain   ? bone spurs at base of head per pt  ? Osteoarthritis   ? lumbar,cervical,joints  ? Pneumonia   ? hx of > 20 yrs ago  ? Shortness of breath   ? occasionally and with exertion. Albuterol inhaler as needed  ? Spinal headache 1991  ? blood patch placed  ? Spondylitis ([place])   ? Unsteady gait   ? occasionally  ? Urinary urgency   ? ?Past Surgical History:  ?Procedure Laterality Date  ? ABDOMINAL AORTOGRAM W/LOWER EXTREMITY N/A 02/15/2022  ? Procedure: ABDOMINAL AORTOGRAM W/LOWER EXTREMITY;  Surgeon: Waynetta Sandy, MD;  Location: [facility];  Service: Cardiovascular;  Laterality: N/A;  ? ABDOMINAL HYSTERECTOMY    ? ANTERIOR FUSION CERVICAL SPINE    ?  x2 -C4-7  ? BUNIONECTOMY Bilateral   ? COLONOSCOPY    ? CYSTOSCOPY W/ URETEROSCOPY  2012  ? EYE SURGERY Bilateral   ? cataract /lens implant  ? HOLMIUM LASER APPLICATION Left [id number]  ? Procedure: HOLMIUM LASER APPLICATION;  Surgeon: Malka So, MD;  Location: [facility];  Service: Urology;  Laterality: Left;  ? I & D EXTREMITY Right 02/10/2022  ? Procedure: DEBRIDEMENT RIGHT LEG ABSCESS;  Surgeon: Newt Minion, MD;  Location: [place];  Service: Orthopedics;  Laterality: Right;  ? INSERTION OF MESH N/A 07/15/2014  ?  Procedure: INSERTION OF MESH;  Surgeon: Adin Hector, MD;  Location: [place];  Service: General;  Laterality: N/A;  ? JOINT REPLACEMENT Right 2012  ? shoulder  ? LAPAROSCOPIC CHOLECYSTECTOMY W/ CHOLANGIOGRAPHY  2012  ? Dr Ninfa Linden  ? NASAL SINUS SURGERY    ? OPEN REDUCTION INTERNAL FIXATION (ORIF) DISTAL RADIAL FRACTURE Left 03/20/2021  ? Procedure: OPEN REDUCTION INTERNAL FIXATION (ORIF) DISTAL RADIAL FRACTURE;  Surgeon: Marchia Bond, MD;  Location: [place];  Service: Orthopedics;  Laterality: Left;  ? RADIOLOGY WITH ANESTHESIA N/A 05/09/2014  ? Procedure: ADULT SEDATION WITH ANESTHESIA/MRI CERVICAL SPINE WITHOUT CONTRAST;  Surgeon: Medication Radiologist, MD;  Location: [place];  Service: Radiology;  Laterality: N/A;  DR. HAWKS/MRI  ? right knee arthroscopy    ? d/t meniscal tear  ? SHOULDER ARTHROSCOPY W/ ROTATOR CUFF REPAIR Bilateral three times each over several yrs  ? SKIN SPLIT GRAFT Right 02/17/2022  ? Procedure: RIGHT LEG SKIN GRAFT;  Surgeon: Newt Minion, MD;  Location: [facility];  Service: Orthopedics;  Laterality: Right;  ? THUMB ARTHROSCOPY Left   ? 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 Procedure: RIGHT TOTAL KNEE ARTHROPLASTY;  Surgeon: Dorna Leitz, MD;  Location: [place];  Service: Orthopedics;  Laterality: Right;  ? UMBILICAL HERNIA REPAIR N/A 07/15/2014  ? Procedure: LAPAROSCOPIC UMBILICAL AND INFRAUMBILICAL HERNIA;  Surgeon: Adin Hector, MD;  Location: [place];  Service: General;  Laterality: N/A;  ? ? ?Allergies  ?Allergen Reactions  ? Dilaudid [Hydromorphone Hcl] Shortness Of Breath  ? Gabapentin Other (See Comments)  ?  Hoarseness , headache and sore throat  ? Latex Rash  ?  Severe rash  ? Lyrica [Pregabalin] Other (See Comments)  ?  No balance , had to walk with cane , Blurred vision,weakness.  ? Oxycodone Shortness Of Breath and Other (See Comments)  ?  Tolerated oxycodone during admission to hospital 02/06/22  ? Singulair [Montelukast] Shortness Of Breath and Other (See Comments)  ?  Vision  issues, also  ? Ciprofloxacin Hcl Nausea And Vomiting and Other (See Comments)  ?  Nausea and vomiting with by mouth form  ? Codeine Other (See Comments)  ?  Hallucinations  ? Methadone Nausea And Vomiting and Other (See Comments)  ?  Severe nausea and vomiting  ? Metronidazole Nausea And Vomiting and Other (See Comments)  ?  Gastric pain  ? Oysters [Shellfish Allergy] Other (See Comments)  ?  "Terrible gastric upset and cramping."  ? Clindamycin/Lincomycin Diarrhea and Nausea Only  ? Donepezil Diarrhea and Other (See Comments)  ?  Severe diarrhea  ? Sulfa Antibiotics Diarrhea and Other (See Comments)  ?  GI issues ?  ? Tape Other (See Comments)  ?  ADHESIVE TAPE-Severe rash  ? Zetia [Ezetimibe] Diarrhea  ? Elemental Sulfur Nausea And Vomiting  ? Iodine Rash  ? Other Rash  ?  All Antibiotic ointments/ creams  ?  Oyster Shell Rash  ? Penicillin G Nausea And Vomiting and Rash  ? Penicillins Nausea And Vomiting and Rash  ?  Has patient had a PCN reaction causing immediate rash, facial/tongue/throat swelling, SOB or lightheadedness with hypotension: Yes ?Has patient had a PCN reaction causing severe rash involving mucus membranes or skin necrosis: No ?Has patient had a PCN reaction that required hospitalization No ?Has patient had a PCN reaction occurring within the last 10 years: No ?If all of the above answers are "NO", then may proceed with Cephalosporin use. ?  ? Povidone Iodine Rash and Other (See Comments)  ?  Oyster shell products- Rash ?  ? Skintegrity Hydrogel [Skin Protectants, Misc.] Rash  ? Tapentadol Other (See Comments)  ?  Nightmares  ? ? ?Outpatient Encounter Medications as of 02/25/2022  ?Medication Sig  ? acetaminophen (TYLENOL) 500 MG tablet Take 2 tablets (1,000 mg total) by mouth 3 (three) times daily.  ? albuterol (VENTOLIN HFA) 108 (90 Base) MCG/ACT inhaler Inhale 2 puffs into the lungs every 4 (four) hours as needed for shortness of breath.   ? amiodarone (PACERONE) 200 MG tablet Take 1 tablet (200  mg total) by mouth 2 (two) times daily.  ? ascorbic acid (VITAMIN C) 500 MG tablet Take 1 tablet (500 mg total) by mouth daily.  ? aspirin EC 81 MG tablet Take 81 mg by mouth daily.  ? bisacodyl (DULCOLAX) 10 MG suppository Place 1

## 2022-02-25 NOTE — Telephone Encounter (Signed)
[facility] called about pt. States that the Vac has lots of bleeding around it so the nurses  took it out. She did end up at the hospital last night due to excessive bleeding. She wanted to [facility] know she is okay now but they want to know what to do in the meantime until her appt tomorrow.  ? ?CB [phone]  ?

## 2022-02-25 NOTE — ED Triage Notes (Signed)
Pt bib ems from [facility] because her disposable wound vac has come off and his bleeding from her leg wound. Pt had skin grafting procedure with wound vac placed on 3/27 that was supposed to stay on for 10 days but came off today.  ? ?HR: 64  ?BP 118/48  ?Spo2: 100% RA ?

## 2022-02-25 NOTE — Discharge Instructions (Signed)
I spoke with the on-call orthopedic physician this morning regarding your right lower extremity wound.  At this point, they would like to leave the wound VAC off and have you follow-up in the office.  Please call Dr. Audrie Lia office and confirm your appointment tomorrow.  Given that the wound VAC came off, they would like you to be seen in the office either today or tomorrow.  This is very important. ? ?Return with any concerns. ?

## 2022-02-25 NOTE — ED Notes (Signed)
Called [facility] to [facility] ?

## 2022-02-25 NOTE — ED Provider Notes (Signed)
?[facility] ?Provider Note ? ? ?CSN: [id number] ?Arrival date & time: 02/25/22  0102 ? ?  ? ?History ? ?Chief Complaint  ?Patient presents with  ? Post-op Problem  ? ? ?AIA HEDGES is a 81 y.o. female. ? ?Patient with history of chronic pain, ankylosing spondylitis, cerebellar stroke, iron deficiency anemia, GERD, HTN, hypothyroidism, aortic stenosis, venous insufficiency and osteoarthritis, recently hospitalized 3/11 - 02/22/2022 with cellulitis of the right lower extremity after a fall at home.  She developed an infection in her leg and underwent excisional debridement of the area by Dr. Sharol Given.  Massive necrotic soft tissue collection was documented.  Wound VAC was placed.  On 3/22 excisional debridement of skin and soft tissue and skin graft was performed by Dr. Sharol Given, with replacement of wound VAC.  Patient presents to the emergency department this morning after wound VAC came off.  She states that this occurred yesterday.  She states that it just kind of came off.  She has pain in her foot, vomiting and has been receiving oxycodone at her rehab facility.  No reported fevers, chest pain, shortness of breath. ? ? ?  ? ?Home Medications ?Prior to Admission medications   ?Medication Sig Start Date End Date Taking? Authorizing Provider  ?acetaminophen (TYLENOL) 500 MG tablet Take 2 tablets (1,000 mg total) by mouth 3 (three) times daily. 02/22/22   Atway, Rayann N, DO  ?albuterol (VENTOLIN HFA) 108 (90 Base) MCG/ACT inhaler Inhale 2 puffs into the lungs every 4 (four) hours as needed for shortness of breath.     [provider]  ?amiodarone (PACERONE) 200 MG tablet Take 1 tablet (200 mg total) by mouth 2 (two) times daily. 02/22/22 03/24/22  Atway, Jeananne Rama, DO  ?ascorbic acid (VITAMIN C) 500 MG tablet Take 1 tablet (500 mg total) by mouth daily. 02/22/22 03/24/22  Dorethea Clan, DO  ?aspirin EC 81 MG tablet Take 81 mg by mouth daily.    [provider]   ?carboxymethylcellulose (REFRESH PLUS) 0.5 % SOLN Place 1 drop into both eyes in the morning, at noon, and at bedtime.    [provider]  ?cholecalciferol (VITAMIN D) 1000 UNITS tablet Take 1,000 Units by mouth 2 (two) times daily.    [provider]  ?diclofenac sodium (VOLTAREN) 1 % GEL Apply 2 g topically 2 (two) times daily as needed (for pain).    [provider]  ?Digestive Enzymes (ENZYME DIGEST PO) Take 1 tablet by mouth daily as needed (For digestive).    [provider]  ?diphenhydrAMINE (BENADRYL) 25 MG tablet Take 25 mg by mouth every 6 (six) hours as needed for allergies.    [provider]  ?docusate sodium (COLACE) 100 MG capsule Take 1 capsule (100 mg total) by mouth 2 (two) times daily. 02/22/22   Dorethea Clan, DO  ?ENSURE PLUS (ENSURE PLUS) LIQD Take 237 mLs by mouth daily as needed (for supplementation).    [provider]  ?fish oil-omega-3 fatty acids 1000 MG capsule Take 1,000 mg by mouth 2 (two) times daily.     [provider]  ?furosemide (LASIX) 20 MG tablet Take 20 mg by mouth daily as needed for fluid.    [provider]  ?levothyroxine (SYNTHROID, LEVOTHROID) 50 MCG tablet Take 50 mcg by mouth every morning.  ?Patient not taking: Reported on 02/06/2022    [provider]  ?loperamide (IMODIUM A-D) 2 MG tablet Take 4 mg by mouth 3 (three)  times daily as needed for diarrhea or loose stools.    [provider]  ?losartan (COZAAR) 25 MG tablet Take 25 mg by mouth daily.    [provider]  ?magnesium 30 MG tablet Take 30 mg by mouth daily.     [provider]  ?methocarbamol (ROBAXIN) 500 MG tablet Take 500 mg by mouth 3 (three) times daily as needed for muscle spasms. 12/03/19   [provider]  ?Multiple Vitamins-Minerals (MULTIVITAMIN PO) Take 1 tablet by mouth daily.    [provider]  ?ondansetron (ZOFRAN) 8 MG tablet Take 1 tablet (8 mg total) by mouth every 8  (eight) hours as needed for nausea or vomiting. 06/25/17   Daleen Bo, MD  ?oxyCODONE (OXY IR/ROXICODONE) 5 MG immediate release tablet Take 1 tablet (5 mg total) by mouth every 4 (four) hours as needed for up to 5 days for severe pain or moderate pain. 02/22/22 02/27/22  Atway, Rayann N, DO  ?pantoprazole (PROTONIX) 40 MG tablet Take 1 tablet (40 mg total) by mouth daily. 03/09/22 04/08/22  Atway, Rayann N, DO  ?pantoprazole (PROTONIX) 40 MG tablet Take 1 tablet (40 mg total) by mouth 2 (two) times daily for 14 days. 02/22/22 03/08/22  Atway, Jeananne Rama, DO  ?polyethylene glycol (MIRALAX / GLYCOLAX) 17 g packet Take 17 g by mouth daily as needed for moderate constipation or mild constipation. 02/22/22   Atway, Rayann N, DO  ?POTASSIUM PO Take 1 tablet by mouth daily.    [provider]  ?predniSONE (DELTASONE) 5 MG tablet Take 5 mg by mouth daily with breakfast. 12/03/19   [provider]  ?Probiotic Product (PROBIOTIC DAILY PO) Take 1 capsule by mouth daily.     [provider]  ?RESTASIS 0.05 % ophthalmic emulsion Place 1 drop into both eyes daily. 09/18/19   [provider]  ?rosuvastatin (CRESTOR) 5 MG tablet Take 5 mg by mouth daily. 01/27/22   [provider]  ?triamcinolone (NASACORT) 55 MCG/ACT AERO nasal inhaler Place 1-2 sprays into the nose 2 (two) times daily as needed (for seasonal allergies). 07/14/17   [provider]  ?Turmeric 500 MG CAPS Take 500 mg by mouth at bedtime.     [provider]  ?vitamin B-12 (CYANOCOBALAMIN) 250 MCG tablet Take 250 mcg by mouth at bedtime.     [provider]  ?zinc sulfate 220 (50 Zn) MG capsule Take 1 capsule (220 mg total) by mouth daily for 4 days. 02/22/22 02/26/22  Dorethea Clan, DO  ?   ? ?Allergies    ?Dilaudid [hydromorphone hcl]; Gabapentin; Latex; Lyrica [pregabalin]; Oxycodone; Singulair [montelukast]; Ciprofloxacin hcl; Codeine; Methadone; Metronidazole; Oysters [shellfish allergy];  Clindamycin/lincomycin; Donepezil; Sulfa antibiotics; Tape; Zetia [ezetimibe]; Elemental sulfur; Iodine; Other; Oyster shell; Penicillin g; Penicillins; Povidone iodine; Skintegrity hydrogel [skin protectants, misc.]; and Tapentadol   ? ?Review of Systems   ?Review of Systems ? ?Physical Exam ?Updated Vital Signs ?BP (!) 114/49   Pulse 60   Temp 98.3 ?F (36.8 ?C) (Oral)   Resp 12   Ht 5' (1.524 m)   Wt 54 kg   SpO2 100%   BMI 23.24 kg/m?  ? ?Physical Exam ?Vitals and nursing note reviewed.  ?Constitutional:   ?   General: She is not in acute distress. ?   Appearance: She is well-developed.  ?HENT:  ?   Head: Normocephalic and atraumatic.  ?   Right Ear: External ear normal.  ?   Left Ear: External ear normal.  ?  Nose: Nose normal.  ?Eyes:  ?   Conjunctiva/sclera: Conjunctivae normal.  ?Cardiovascular:  ?   Rate and Rhythm: Normal rate.  ?Pulmonary:  ?   Effort: No respiratory distress.  ?   Breath sounds: No wheezing, rhonchi or rales.  ?Abdominal:  ?   Palpations: Abdomen is soft.  ?   Tenderness: There is no abdominal tenderness. There is no guarding or rebound.  ?Musculoskeletal:  ?   Cervical back: Normal range of motion and neck supple.  ?   Right lower leg: Edema present.  ?   Left lower leg: Edema present.  ?   Comments: Bilateral lower extremity edema.  No signs of cellulitis left lower extremity.  Right lower extremity is bandaged.  There is a large amount of serosanguineous drainage from the wound on the bandaging. ? ?After removal of the bandaging, there is a large open wound to the anterior right lower leg.  Changes from skin grafting noted.  Wound margins appear clean without signs of infection.  There is coagulated blood that has collected at the distal margin of the wound.  There is some serious fluid collecting medially.  No obvious signs of infection.  ?Skin: ?   General: Skin is warm and dry.  ?   Findings: No rash.  ?Neurological:  ?   General: No focal deficit present.  ?   Mental  Status: She is alert. Mental status is at baseline.  ?   Motor: No weakness.  ?Psychiatric:     ?   Mood and Affect: Mood normal.  ? ? ? ? ? ?ED Results / Procedures / Treatments   ?Labs ?(all labs ordered are listed, but only abnor

## 2022-02-25 NOTE — Telephone Encounter (Signed)
I called and lm on vm per Dr. Sharol Given to apply a thin layer of silvadene to gauze and ace wrap toes to knee and can change this daily. The pt has an appt tomorrow just wanted to make sure Isabel Barnes includes this in her orders being that I did not speak to a live person.  ?

## 2022-02-26 ENCOUNTER — Ambulatory Visit (INDEPENDENT_AMBULATORY_CARE_PROVIDER_SITE_OTHER): Payer: Medicare Other | Admitting: Family

## 2022-02-26 ENCOUNTER — Encounter: Payer: Self-pay | Admitting: Family

## 2022-02-26 DIAGNOSIS — L97912 Non-pressure chronic ulcer of unspecified part of right lower leg with fat layer exposed: Secondary | ICD-10-CM

## 2022-02-26 DIAGNOSIS — L03115 Cellulitis of right lower limb: Secondary | ICD-10-CM

## 2022-02-26 NOTE — Progress Notes (Signed)
? ?Post-Op Visit Note ?  ?Patient: Isabel Barnes           ?Date of Birth: 04-05-1941           ?MRN: [id number] ?Visit Date: 02/26/2022 ?PCP: Ardith Dark, PA-C ? ?Chief Complaint: No chief complaint on file. ? ? ?HPI:  ?HPI ?The patient is an 81 year old woman who is seen today status post debridement of her right lower extremity ulcer with Kerecis graft placement on March 22 yesterday she did have an emergency department visit due to excessive bleeding and her wound VAC coming off she is unsure how this came off. ? ?She is also residing at skilled nursing ?Ortho Exam ?On examination of the right lower extremity she does have moderate to severe edema this is equal bilateral lower extremities.  There is significant ulcer on the right the Kerecis graft is intact with good uptake.  This is stapled in place there is a significant hematoma in the distal aspect of her ulcer this was debrided with gauze she has mild active bleeding.  There is no surrounding erythema no odor no purulence ? ?Visit Diagnoses: No diagnosis found. ? ?Plan: Instructions provided with a we will begin applying a thin layer of Silvadene to gauze and then an Ace wrap from her toes up to her knees this will be changed daily with Dial soap cleansing ? ?Follow-Up Instructions: No follow-ups on file.  ? ?Imaging: ?No results found. ? ?Orders:  ?No orders of the defined types were placed in this encounter. ? ?No orders of the defined types were placed in this encounter. ? ? ? ?PMFS History: ?Patient Active Problem List  ? Diagnosis Date Noted  ? Abscess of right lower leg   ? Severe protein-calorie malnutrition ([place])   ? PVD (peripheral vascular disease) ([place])   ? Fall   ? Atrial fibrillation ([place])   ? Goals of care, counseling/discussion   ? Anemia   ? Fluid collection (edema) in the arms, legs, hands and feet   ? Cellulitis of right lower extremity 02/06/2022  ? Chest pain, rule out acute myocardial infarction 03/16/2021  ? HTN (hypertension)  03/16/2021  ? Hypothyroidism 03/16/2021  ? HLD (hyperlipidemia) 03/16/2021  ? Aortic valve stenosis 03/16/2021  ? Chronic venous insufficiency of lower extremity 03/16/2021  ? Short-term memory loss 12/13/2019  ? Psoriatic arthritis ([place]) 12/13/2019  ? Primary osteoarthritis of right knee 07/16/2016  ? Eosinophilia 06/16/2015  ? Chronic pain disorder   ? Osteoarthritis   ? Incisional umbilical hernia, without obstruction or gangrene   ? Iron deficiency anemia 09/19/2012  ? ?Past Medical History:  ?Diagnosis Date  ? Anemia   ? iron deficiency hx.has had iron infusions before   ? Chronic low back pain   ? Chronic pain disorder   ? Complication of anesthesia   ? severe claustrophobia  ? Constipation   ? r/t use of pain meds.Takes OTC meds or eats prunes  ? Depression   ? GERD (gastroesophageal reflux disease)   ? takes Omeprazole daily  ? Heart murmur   ? mild MS, moderate-severe AS 03/17/21 echo  ? History of bronchitis   ? 20+ yrs ago  ? History of kidney stones   ? 3 surgerical removed, 1 passed  ? History of prolapse of bladder   ? History of shingles   ? Hypertension   ? takes Losartan daily  ? Hypothyroidism   ? takes Synthroid daily  ? Joint swelling   ? Neck pain   ?  bone spurs at base of head per pt  ? Osteoarthritis   ? lumbar,cervical,joints  ? Pneumonia   ? hx of > 20 yrs ago  ? Shortness of breath   ? occasionally and with exertion. Albuterol inhaler as needed  ? Spinal headache 1991  ? blood patch placed  ? Spondylitis ([place])   ? Unsteady gait   ? occasionally  ? Urinary urgency   ?  ?Family History  ?Problem Relation Age of Onset  ? Stroke Father   ?  ?Past Surgical History:  ?Procedure Laterality Date  ? ABDOMINAL AORTOGRAM W/LOWER EXTREMITY N/A 02/15/2022  ? Procedure: ABDOMINAL AORTOGRAM W/LOWER EXTREMITY;  Surgeon: Waynetta Sandy, MD;  Location: [facility];  Service: Cardiovascular;  Laterality: N/A;  ? ABDOMINAL HYSTERECTOMY    ? ANTERIOR FUSION CERVICAL SPINE    ? x2 -C4-7  ?  BUNIONECTOMY Bilateral   ? COLONOSCOPY    ? CYSTOSCOPY W/ URETEROSCOPY  2012  ? EYE SURGERY Bilateral   ? cataract /lens implant  ? HOLMIUM LASER APPLICATION Left [id number]  ? Procedure: HOLMIUM LASER APPLICATION;  Surgeon: Malka So, MD;  Location: [facility];  Service: Urology;  Laterality: Left;  ? I & D EXTREMITY Right 02/10/2022  ? Procedure: DEBRIDEMENT RIGHT LEG ABSCESS;  Surgeon: Newt Minion, MD;  Location: [place] [facility];  Service: Orthopedics;  Laterality: Right;  ? INSERTION OF MESH N/A 07/15/2014  ? Procedure: INSERTION OF MESH;  Surgeon: Adin Hector, MD;  Location: [place];  Service: General;  Laterality: N/A;  ? JOINT REPLACEMENT Right 2012  ? shoulder  ? LAPAROSCOPIC CHOLECYSTECTOMY W/ CHOLANGIOGRAPHY  2012  ? Dr Ninfa Linden  ? NASAL SINUS SURGERY    ? OPEN REDUCTION INTERNAL FIXATION (ORIF) DISTAL RADIAL FRACTURE Left 03/20/2021  ? Procedure: OPEN REDUCTION INTERNAL FIXATION (ORIF) DISTAL RADIAL FRACTURE;  Surgeon: Marchia Bond, MD;  Location: [place];  Service: Orthopedics;  Laterality: Left;  ? RADIOLOGY WITH ANESTHESIA N/A 05/09/2014  ? Procedure: ADULT SEDATION WITH ANESTHESIA/MRI CERVICAL SPINE WITHOUT CONTRAST;  Surgeon: Medication Radiologist, MD;  Location: [place];  Service: Radiology;  Laterality: N/A;  DR. HAWKS/MRI  ? right knee arthroscopy    ? d/t meniscal tear  ? SHOULDER ARTHROSCOPY W/ ROTATOR CUFF REPAIR Bilateral three times each over several yrs  ? SKIN SPLIT GRAFT Right 02/17/2022  ? Procedure: RIGHT LEG SKIN GRAFT;  Surgeon: Newt Minion, MD;  Location: [place] [facility];  Service: Orthopedics;  Laterality: Right;  ? THUMB ARTHROSCOPY Left   ? 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 Procedure: RIGHT TOTAL KNEE ARTHROPLASTY;  Surgeon: Dorna Leitz, MD;  Location: [place];  Service: Orthopedics;  Laterality: Right;  ? UMBILICAL HERNIA REPAIR N/A 07/15/2014  ? Procedure: LAPAROSCOPIC UMBILICAL AND INFRAUMBILICAL HERNIA;  Surgeon: Adin Hector, MD;  Location: [facility];  Service: General;   Laterality: N/A;  ? ?Social History  ? ?Occupational History  ? Not on file  ?Tobacco Use  ? Smoking status: Former  ?  Packs/day: 0.50  ?  Years: 2.00  ?  Pack years: 1.00  ?  Types: Cigarettes  ?  Quit date: 02/07/1992  ?  Years since quitting: 30.0  ? Smokeless tobacco: Never  ?Vaping Use  ? Vaping Use: Never used  ?Substance and Sexual Activity  ? Alcohol use: No  ? Drug use: No  ? Sexual activity: Not on file  ? ? ?

## 2022-02-26 NOTE — Telephone Encounter (Signed)
Erin informed.

## 2022-03-04 ENCOUNTER — Other Ambulatory Visit: Payer: Self-pay | Admitting: Adult Health

## 2022-03-04 LAB — CBC AND DIFFERENTIAL
HCT: 29 — AB (ref 36–46)
Hemoglobin: 9.8 — AB (ref 12.0–16.0)
Neutrophils Absolute: 3.7
Platelets: 420 10*3/uL — AB (ref 150–400)
WBC: 5.7

## 2022-03-04 LAB — BASIC METABOLIC PANEL
BUN: 16 (ref 4–21)
CO2: 29 — AB (ref 13–22)
Chloride: 103 (ref 99–108)
Creatinine: 0.8 (ref 0.5–1.1)
Glucose: 106
Potassium: 4.7 mEq/L (ref 3.5–5.1)
Sodium: 138 (ref 137–147)

## 2022-03-04 LAB — COMPREHENSIVE METABOLIC PANEL
Calcium: 8.5 — AB (ref 8.7–10.7)
eGFR: 78

## 2022-03-04 LAB — CBC: RBC: 3.43 — AB (ref 3.87–5.11)

## 2022-03-04 MED ORDER — OXYCODONE HCL 5 MG PO TABS: 5.0000 mg | ORAL_TABLET | Freq: Four times a day (QID) | ORAL | 0 refills | Status: DC | PRN

## 2022-03-08 ENCOUNTER — Encounter: Payer: Self-pay | Admitting: Orthopedic Surgery

## 2022-03-08 ENCOUNTER — Ambulatory Visit (INDEPENDENT_AMBULATORY_CARE_PROVIDER_SITE_OTHER): Payer: Medicare Other | Admitting: Orthopedic Surgery

## 2022-03-08 DIAGNOSIS — L97912 Non-pressure chronic ulcer of unspecified part of right lower leg with fat layer exposed: Secondary | ICD-10-CM

## 2022-03-08 NOTE — Progress Notes (Signed)
? ?Office Visit Note ?  ?Patient: Isabel Barnes           ?Date of Birth: [birth year]           ?MRN: [id number] ?Visit Date: 03/08/2022 ?             ?Requested by: Ardith Dark, PA-C ?[id number] ?[address] ?[place],  [place] [address] ?PCP: Ardith Dark, PA-C ? ?Chief Complaint  ?Patient presents with  ? Right Leg - Routine Post Op  ?  02/17/22 right leg debridement and kerecis graft   ? ? ? ? ?HPI: ?Patient is an 81 year old woman who presents in follow-up status post right leg skin graft with debridement approximately 3 weeks ago.  Patient has been undergoing Silvadene dressing changes at skilled nursing.  She states she is trying to elevate her leg but does have increased swelling. ? ?Assessment & Plan: ?Visit Diagnoses:  ?1. Chronic ulcer of right leg, with fat layer exposed ([place])   ? ? ?Plan: With the increased swelling dermatitis increase drainage and progressive wound breakdown will need to plan for repeat debridement application of Kerecis tissue graft application of a cleanse choice wound VAC and return to skilled nursing at [facility].  Risk and benefits were discussed including need for additional surgery. ? ?Follow-Up Instructions: Return in about 2 weeks (around 03/22/2022).  ? ?Ortho Exam ? ?Patient is alert, oriented, no adenopathy, well-dressed, normal affect, normal respiratory effort. ?Examination patient has epiboly around the wound edges with stalled healing.  There is clear serous drainage.  There is increased swelling in the right lower extremity with lymphatic insufficiency.  There is approximately 25% fibrinous tissue over the wound bed.  There is no ascending cellulitis. ? ?Imaging: ?No results found. ? ? ?Labs: ?Lab Results  ?Component Value Date  ? ESRSEDRATE 40 (H) 02/07/2022  ? CRP 20.5 (H) 02/07/2022  ? REPTSTATUS 02/16/2022 FINAL 02/10/2022  ? GRAMSTAIN  02/10/2022  ?  RARE WBC PRESENT, PREDOMINANTLY PMN ?NO ORGANISMS SEEN ?  ? CULT  02/10/2022  ?  RARE ENTEROCOCCUS  FAECALIS ?RARE BACTEROIDES SPECIES NOT FRAGILIS ?BETA LACTAMASE POSITIVE ?Performed at [facility], [place] [address], [place], [place] [address] ?  ? LABORGA ENTEROCOCCUS FAECALIS 02/10/2022  ? ? ? ?Lab Results  ?Component Value Date  ? ALBUMIN 1.5 (L) 02/09/2022  ? ALBUMIN 2.1 (L) 02/06/2022  ? ALBUMIN 3.7 06/25/2017  ? ? ?Lab Results  ?Component Value Date  ? MG 2.0 02/12/2022  ? MG 1.8 02/07/2022  ? ?Lab Results  ?Component Value Date  ? VD25OH 69.88 02/07/2022  ? ? ?No results found for: PREALBUMIN ? ?  Latest Ref Rng & Units 02/25/2022  ?  1:16 AM 02/22/2022  ?  5:51 AM 02/21/2022  ?  7:03 AM  ?CBC EXTENDED  ?WBC 4.0 - 10.5 K/uL 10.0   8.2   8.5    ?RBC 3.87 - 5.11 MIL/uL 2.98   3.05   2.98    ?Hemoglobin 12.0 - 15.0 g/dL 8.2   8.2   8.1    ?HCT 36.0 - 46.0 % 26.7   26.3   25.5    ?Platelets 150 - 400 K/uL 375   372   346    ?NEUT# 1.7 - 7.7 K/uL 6.3      ?Lymph# 0.7 - 4.0 K/uL 2.1      ? ? ? ?There is no height or weight on file to calculate BMI. ? ?Orders:  ?No orders of the defined  types were placed in this encounter. ? ?No orders of the defined types were placed in this encounter. ? ? ? Procedures: ?No procedures performed ? ?Clinical Data: ?No additional findings. ? ?ROS: ? ?All other systems negative, except as noted in the HPI. ?Review of Systems ? ?Objective: ?Vital Signs: There were no vitals taken for this visit. ? ?Specialty Comments:  ?No specialty comments available. ? ?PMFS History: ?Patient Active Problem List  ? Diagnosis Date Noted  ? Abscess of right lower leg   ? Severe protein-calorie malnutrition ([place])   ? PVD (peripheral vascular disease) ([place])   ? Fall   ? Atrial fibrillation ([place])   ? Goals of care, counseling/discussion   ? Anemia   ? Fluid collection (edema) in the arms, legs, hands and feet   ? Cellulitis of right lower extremity 02/06/2022  ? Chest pain, rule out acute myocardial infarction 03/16/2021  ? HTN (hypertension) 03/16/2021  ? Hypothyroidism 03/16/2021  ? HLD  (hyperlipidemia) 03/16/2021  ? Aortic valve stenosis 03/16/2021  ? Chronic venous insufficiency of lower extremity 03/16/2021  ? Short-term memory loss 12/13/2019  ? Psoriatic arthritis ([place]) 12/13/2019  ? Primary osteoarthritis of right knee 07/16/2016  ? Eosinophilia 06/16/2015  ? Chronic pain disorder   ? Osteoarthritis   ? Incisional umbilical hernia, without obstruction or gangrene   ? Iron deficiency anemia 09/19/2012  ? ?Past Medical History:  ?Diagnosis Date  ? Anemia   ? iron deficiency hx.has had iron infusions before   ? Chronic low back pain   ? Chronic pain disorder   ? Complication of anesthesia   ? severe claustrophobia  ? Constipation   ? r/t use of pain meds.Takes OTC meds or eats prunes  ? Depression   ? GERD (gastroesophageal reflux disease)   ? takes Omeprazole daily  ? Heart murmur   ? mild MS, moderate-severe AS 03/17/21 echo  ? History of bronchitis   ? 20+ yrs ago  ? History of kidney stones   ? 3 surgerical removed, 1 passed  ? History of prolapse of bladder   ? History of shingles   ? Hypertension   ? takes Losartan daily  ? Hypothyroidism   ? takes Synthroid daily  ? Joint swelling   ? Neck pain   ? bone spurs at base of head per pt  ? Osteoarthritis   ? lumbar,cervical,joints  ? Pneumonia   ? hx of > 20 yrs ago  ? Shortness of breath   ? occasionally and with exertion. Albuterol inhaler as needed  ? Spinal headache 1991  ? blood patch placed  ? Spondylitis ([place])   ? Unsteady gait   ? occasionally  ? Urinary urgency   ?  ?Family History  ?Problem Relation Age of Onset  ? Stroke Father   ?  ?Past Surgical History:  ?Procedure Laterality Date  ? ABDOMINAL AORTOGRAM W/LOWER EXTREMITY N/A 02/15/2022  ? Procedure: ABDOMINAL AORTOGRAM W/LOWER EXTREMITY;  Surgeon: Waynetta Sandy, MD;  Location: [facility];  Service: Cardiovascular;  Laterality: N/A;  ? ABDOMINAL HYSTERECTOMY    ? ANTERIOR FUSION CERVICAL SPINE    ? x2 -C4-7  ? BUNIONECTOMY Bilateral   ? COLONOSCOPY    ?  CYSTOSCOPY W/ URETEROSCOPY  2012  ? EYE SURGERY Bilateral   ? cataract /lens implant  ? HOLMIUM LASER APPLICATION Left [id number]  ? Procedure: HOLMIUM LASER APPLICATION;  Surgeon: Malka So, MD;  Location: [facility];  Service: Urology;  Laterality:  Left;  ? I & D EXTREMITY Right 02/10/2022  ? Procedure: DEBRIDEMENT RIGHT LEG ABSCESS;  Surgeon: Newt Minion, MD;  Location: [facility];  Service: Orthopedics;  Laterality: Right;  ? INSERTION OF MESH N/A 07/15/2014  ? Procedure: INSERTION OF MESH;  Surgeon: Adin Hector, MD;  Location: [facility];  Service: General;  Laterality: N/A;  ? JOINT REPLACEMENT Right 2012  ? shoulder  ? LAPAROSCOPIC CHOLECYSTECTOMY W/ CHOLANGIOGRAPHY  2012  ? Dr Ninfa Linden  ? NASAL SINUS SURGERY    ? OPEN REDUCTION INTERNAL FIXATION (ORIF) DISTAL RADIAL FRACTURE Left 03/20/2021  ? Procedure: OPEN REDUCTION INTERNAL FIXATION (ORIF) DISTAL RADIAL FRACTURE;  Surgeon: Marchia Bond, MD;  Location: [facility];  Service: Orthopedics;  Laterality: Left;  ? RADIOLOGY WITH ANESTHESIA N/A 05/09/2014  ? Procedure: ADULT SEDATION WITH ANESTHESIA/MRI CERVICAL SPINE WITHOUT CONTRAST;  Surgeon: Medication Radiologist, MD;  Location: [facility];  Service: Radiology;  Laterality: N/A;  DR. HAWKS/MRI  ? right knee arthroscopy    ? d/t meniscal tear  ? SHOULDER ARTHROSCOPY W/ ROTATOR CUFF REPAIR Bilateral three times each over several yrs  ? SKIN SPLIT GRAFT Right 02/17/2022  ? Procedure: RIGHT LEG SKIN GRAFT;  Surgeon: Newt Minion, MD;  Location: [facility];  Service: Orthopedics;  Laterality: Right;  ? THUMB ARTHROSCOPY Left   ? 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 Procedure: RIGHT TOTAL KNEE ARTHROPLASTY;  Surgeon: Dorna Leitz, MD;  Location: [place] [facility];  Service: Orthopedics;  Laterality: Right;  ? UMBILICAL HERNIA REPAIR N/A 07/15/2014  ? Procedure: LAPAROSCOPIC UMBILICAL AND INFRAUMBILICAL HERNIA;  Surgeon: Adin Hector, MD;  Location: [place] [facility];  Service: General;  Laterality: N/A;  ? ?Social History   ? ?Occupational History  ? Not on file  ?Tobacco Use  ? Smoking status: Former  ?  Packs/day: 0.50  ?  Years: 2.00  ?  Pack years: 1.00  ?  Types: Cigarettes  ?  Quit date: 02/07/1992  ?  Years since quitting: 30.1  ? Smokeless tobacc

## 2022-03-10 ENCOUNTER — Telehealth: Payer: Self-pay | Admitting: Orthopedic Surgery

## 2022-03-10 ENCOUNTER — Encounter (HOSPITAL_COMMUNITY): Payer: Self-pay | Admitting: Orthopedic Surgery

## 2022-03-10 NOTE — Telephone Encounter (Signed)
Pt was in the office this week s/p debridement of left and kerecis graft. She is sch for repeat debridement frday with graft placement. Her daughter who lives [place] would like a call to discuss her appt.   ?

## 2022-03-10 NOTE — Telephone Encounter (Signed)
Pt's daughter Jacki Cones called for progress of mother/pt. Daughter is out the states and pt is in a rehab and need update on progress of mother surgery and skin graft results. Please call at your earliest convenience  at [phone]. ?

## 2022-03-11 ENCOUNTER — Encounter (HOSPITAL_COMMUNITY): Payer: Self-pay | Admitting: Orthopedic Surgery

## 2022-03-11 ENCOUNTER — Non-Acute Institutional Stay (SKILLED_NURSING_FACILITY): Payer: Medicare Other | Admitting: Adult Health

## 2022-03-11 ENCOUNTER — Encounter: Payer: Self-pay | Admitting: Adult Health

## 2022-03-11 DIAGNOSIS — I480 Paroxysmal atrial fibrillation: Secondary | ICD-10-CM

## 2022-03-11 DIAGNOSIS — I10 Essential (primary) hypertension: Secondary | ICD-10-CM

## 2022-03-11 DIAGNOSIS — D500 Iron deficiency anemia secondary to blood loss (chronic): Secondary | ICD-10-CM

## 2022-03-11 DIAGNOSIS — L03115 Cellulitis of right lower limb: Secondary | ICD-10-CM | POA: Diagnosis not present

## 2022-03-11 NOTE — Progress Notes (Signed)
? ?Location:  [facility] ?Nursing Home Room Number: 307 A ?Place of Service:  SNF (31) ?Provider:  Kenard Gower, DNP, FNP-BC ? ?Patient Care Team: ?Rubye Beach as PCP - General (Physician Assistant) ?Iran Ouch, MD as PCP - Cardiology (Cardiology) ?Hedgecock, Orlene Plum as Advice worker (Physician Geophysicist/field seismologist) ? ?Extended Emergency Contact Information ?Primary Emergency Contact: Lane,Mark ?Work Phone: [phone] ?Mobile Phone: [phone] ?Relation: Son ?Secondary Emergency Contact: Lee,Deanna ?Mobile Phone: [phone] ?Relation: Neighbor ?Interpreter needed? No ? ?Code Status:  FULL CODE ? ?Goals of care: Advanced Directive information ? ?  03/15/2022  ?  4:21 PM  ?Advanced Directives  ?Does Patient Have a Medical Advance Directive? Yes  ?Does patient want to make changes to medical advance directive? No - Patient declined  ?Pre-existing out of facility DNR order (yellow form or pink MOST form) Pink MOST form placed in chart (order not valid for inpatient use)  ? ? ? ?Chief Complaint  ?Patient presents with  ? Acute Visit  ?  Short term rehab  ? ? ?HPI:  ?Pt is a 81 y.o. female seen today for short-term rehabilitation. Resident was seen in her room today. She has right lower leg wrapped with Kerlix dressing. She is having PT, OT and ST. ? ?Primary hypertension  -  SBPs ranging from 111 to 142, takes losartan 25 mg 1 tab daily ? ?Iron deficiency anemia due to chronic blood loss  -  hgb 9.8, takes ferrous sulfate 325 mg 1 tab daily ? ?Paroxysmal atrial fibrillation (HCC) -   takes amiodarone 200 mg 1 tab twice a day ? ?Cellulitis of right lower extremity -  completed antibiotics, treatment done daily to right lower leg, for right lower leg irrigation and debridement today. ? ? ? ?Past Medical History:  ?Diagnosis Date  ? Anemia   ? iron deficiency hx.has had iron infusions before   ? Chronic low back pain   ? Chronic pain disorder   ? Complication of anesthesia   ?  severe claustrophobia  ? Constipation   ? r/t use of pain meds.Takes OTC meds or eats prunes  ? CVA (cerebral vascular accident) [facility])   ? 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 Depression   ? GERD (gastroesophageal reflux disease)   ? takes Omeprazole daily  ? Heart murmur   ? mild MS, moderate-severe AS 03/17/21 echo  ? History of bronchitis   ? 20+ yrs ago  ? History of kidney stones   ? 3 surgerical removed, 1 passed  ? History of prolapse of bladder   ? History of shingles   ? Hypertension   ? takes Losartan daily  ? Hypothyroidism   ? takes Synthroid daily  ? Joint swelling   ? Neck pain   ? bone spurs at base of head per pt  ? Osteoarthritis   ? lumbar,cervical,joints  ? Pneumonia   ? hx of > 20 yrs ago  ? Shortness of breath   ? occasionally and with exertion. Albuterol inhaler as needed  ? Spinal headache 1991  ? blood patch placed  ? 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 Unsteady gait   ? occasionally  ? Urinary urgency   ? ?Past Surgical History:  ?Procedure Laterality Date  ? ABDOMINAL AORTOGRAM W/LOWER EXTREMITY N/A 02/15/2022  ? Procedure: ABDOMINAL AORTOGRAM W/LOWER EXTREMITY;  Surgeon: Maeola Harman, MD;  Location: [facility];  Service: Cardiovascular;  Laterality: N/A;  ? ABDOMINAL HYSTERECTOMY    ? ANTERIOR  FUSION CERVICAL SPINE    ? x2 -C4-7  ? APPLICATION OF WOUND VAC Right 03/12/2022  ? Procedure: APPLICATION OF WOUND VAC;  Surgeon: Nadara Mustard, MD;  Location: [facility];  Service: Orthopedics;  Laterality: Right;  ? BUNIONECTOMY Bilateral   ? COLONOSCOPY    ? CYSTOSCOPY W/ URETEROSCOPY  2012  ? EYE SURGERY Bilateral   ? cataract /lens implant  ? HOLMIUM LASER APPLICATION Left 02/08/2013  ? Procedure: HOLMIUM LASER APPLICATION;  Surgeon: Anner Crete, MD;  Location: [facility];  Service: Urology;  Laterality: Left;  ? I & D EXTREMITY Right 02/10/2022  ? Procedure: DEBRIDEMENT RIGHT LEG ABSCESS;  Surgeon: Nadara Mustard, MD;  Location: [facility];  Service:  Orthopedics;  Laterality: Right;  ? I & D EXTREMITY Right 03/12/2022  ? Procedure: RIGHT LEG IRRIGATION AND DEBRIDEMENT EXTREMITY;  Surgeon: Nadara Mustard, MD;  Location: [facility];  Service: Orthopedics;  Laterality: Right;  ? INSERTION OF MESH N/A 07/15/2014  ? Procedure: INSERTION OF MESH;  Surgeon: Ardeth Sportsman, MD;  Location: [facility];  Service: General;  Laterality: N/A;  ? JOINT REPLACEMENT Right 2012  ? shoulder  ? LAPAROSCOPIC CHOLECYSTECTOMY W/ CHOLANGIOGRAPHY  2012  ? Dr Magnus Ivan  ? NASAL SINUS SURGERY    ? OPEN REDUCTION INTERNAL FIXATION (ORIF) DISTAL RADIAL FRACTURE Left 03/20/2021  ? Procedure: OPEN REDUCTION INTERNAL FIXATION (ORIF) DISTAL RADIAL FRACTURE;  Surgeon: Teryl Lucy, MD;  Location: [facility];  Service: Orthopedics;  Laterality: Left;  ? RADIOLOGY WITH ANESTHESIA N/A 05/09/2014  ? Procedure: ADULT SEDATION WITH ANESTHESIA/MRI CERVICAL SPINE WITHOUT CONTRAST;  Surgeon: Medication Radiologist, MD;  Location: [facility];  Service: Radiology;  Laterality: N/A;  DR. HAWKS/MRI  ? right knee arthroscopy    ? d/t meniscal tear  ? SHOULDER ARTHROSCOPY W/ ROTATOR CUFF REPAIR Bilateral three times each over several yrs  ? SKIN FULL THICKNESS GRAFT Right 03/12/2022  ? Procedure: SKIN GRAFT FULL THICKNESS;  Surgeon: Nadara Mustard, MD;  Location: [facility];  Service: Orthopedics;  Laterality: Right;  ? SKIN SPLIT GRAFT Right 02/17/2022  ? Procedure: RIGHT LEG SKIN GRAFT;  Surgeon: Nadara Mustard, MD;  Location: [facility] OR;  Service: Orthopedics;  Laterality: Right;  ? THUMB ARTHROSCOPY Left   ? 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 Procedure: RIGHT TOTAL KNEE ARTHROPLASTY;  Surgeon: Jodi Geralds, MD;  Location: [facility];  Service: Orthopedics;  Laterality: Right;  ? UMBILICAL HERNIA REPAIR N/A 07/15/2014  ? Procedure: LAPAROSCOPIC UMBILICAL AND INFRAUMBILICAL HERNIA;  Surgeon: Ardeth Sportsman, MD;  Location: [facility];  Service: General;  Laterality: N/A;  ? ? ?Allergies  ?Allergen Reactions  ? Dilaudid [Hydromorphone Hcl]  Shortness Of Breath  ? Gabapentin Other (See Comments)  ?  Hoarseness , headache and sore throat  ? Latex Rash  ?  Severe rash  ? Lyrica [Pregabalin] Other (See Comments)  ?  No balance , had to walk with cane , Blurred vision,weakness.  ? Oxycodone Shortness Of Breath and Other (See Comments)  ?  Tolerated oxycodone during admission to hospital 02/06/22  ? Singulair [Montelukast] Shortness Of Breath and Other (See Comments)  ?  Vision issues, also  ? Ciprofloxacin Hcl Nausea And Vomiting and Other (See Comments)  ?  Nausea and vomiting with by mouth form  ? Codeine Other (See Comments)  ?  Hallucinations  ? Methadone Nausea And Vomiting and Other (See Comments)  ?  Severe nausea and vomiting  ? Metronidazole Nausea And  Vomiting and Other (See Comments)  ?  Gastric pain  ? Oysters [Shellfish Allergy] Other (See Comments)  ?  "Terrible gastric upset and cramping."  ? Clindamycin/Lincomycin Diarrhea and Nausea Only  ? Donepezil Diarrhea and Other (See Comments)  ?  Severe diarrhea  ? Sulfa Antibiotics Diarrhea and Other (See Comments)  ?  GI issues ?  ? Tape Other (See Comments)  ?  ADHESIVE TAPE-Severe rash  ? Zetia [Ezetimibe] Diarrhea  ? Elemental Sulfur Nausea And Vomiting  ? Iodine Rash  ? Other Rash  ?  All Antibiotic ointments/ creams  ? Oyster Shell Rash  ? Penicillin G Nausea And Vomiting and Rash  ? Penicillins Nausea And Vomiting and Rash  ?  Has patient had a PCN reaction causing immediate rash, facial/tongue/throat swelling, SOB or lightheadedness with hypotension: Yes ?Has patient had a PCN reaction causing severe rash involving mucus membranes or skin necrosis: No ?Has patient had a PCN reaction that required hospitalization No ?Has patient had a PCN reaction occurring within the last 10 years: No ?If all of the above answers are "NO", then may proceed with Cephalosporin use. ?  ? Povidone Iodine Rash and Other (See Comments)  ?  Oyster shell products- Rash ?  ? Skintegrity Hydrogel [Skin Protectants,  Misc.] Rash  ? Tapentadol Other (See Comments)  ?  Nightmares  ? ? ?Outpatient Encounter Medications as of 03/11/2022  ?Medication Sig  ? acetaminophen (TYLENOL) 500 MG tablet Take 2 tablets (1,000 mg total) by mouth 3 (thre

## 2022-03-11 NOTE — Progress Notes (Signed)
Anesthesia Chart Review: SAME DAY WORK-UP ? Case: [id number] Date/Time: 03/12/22 0905  ? Procedures:  ?    RIGHT LEG IRRIGATION AND DEBRIDEMENT EXTREMITY (Right) ?    SKIN GRAFT FULL THICKNESS (Right)  ? Anesthesia type: Choice  ? Pre-op diagnosis: CHRONIC WOUND RIGHT LEG  ? Location: [facility] ROOM 05 / [facility]  ? Surgeons: Newt Minion, MD  ? ?  ? ? ?DISCUSSION: Patient is an 81 year old female scheduled for the above procedure. S/p RLE debridement and kerecis graft 02/17/22.  She had follow-up with Dr. Sharol Given on 03/08/2022.  He wrote. "With the increased swelling dermatitis increase drainage and progressive wound breakdown will need to plan for repeat debridement application of Kerecis tissue graft application of a cleanse choice wound VAC and return to skilled nursing at [facility].  Risk and benefits were discussed including need for additional surgery." ? ?She currently resides at [facility]. ? ?History includes former smoker (quit 02/07/92), spinal headache (s/p blood patch 1991), murmur (moderate AS 01/2022 echo), PAF (3//23), HTN, claustrophobia, GERD, exertional dyspnea, anemia, LE lymphedema, chronic neck and low back pain, unsteady gait, anemia, hypothyroidism, spinal surgery (ACDF C3-4 & C5-6), ORIF left distal radius (03/20/21), RLE cellulitis/abscess (s/p debridement 02/10/22 and skin graft 02/17/22). 02/06/22 head CT showed evidence of a remote RIGHT cerebellar infarct. ? ?For anesthesia history, on 03/16/21 at the [place], she developed chest pain/SOB (x ~ 4-5 minutes) and EKG changes (ST at 113 bpm, inferolateral ST depression) following LUE nerve block in anticipation of ORIF left wrist fracture. Case was aborted, and she was transferred to [facility] ED. Symptoms and EKG changes improved in ED. HS Troponin peaked at 158. Cardiology was consulted (seen by Gwyndolyn Kaufman, MD and Cherlynn Kaiser, MD). Echo showed LVEF 65-70%, grade II diastolic dysfunction, RVSP 49.3 mmHg, severely dilated LA,  mild MS, moderate-severe AS (worsened from mild AS in 2020). Decision made to also pursue preoperative nuclear stress test.  She had a nonischemic stress test on 03/17/21, and went on to have the surgery at [facility] on 03/25/21. ? ?[place] hospitalization 02/06/22-02/22/22 for RLE cellulitis following fall with RLE injury. S/p Vancomycin for Enterococcus faecalis wound cultures and Flagyl for Bacteroides. S/p operative debridement 02/10/22 and skin graft 02/17/22. 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  Also was in SVT/afib with RVR with hypotension in ED, given adenosine. Cardiology consulted Caryl Comes, Remo Lipps, MD) and started on amiodarone and converted to SR. No mention of anticoagulation at that time given pending surgery, palliative care.  TSH 4.449. Echo rechecked on 02/08/22 and showed normal LVEF, no RWMA, grade II DD, RVSP 39.6 mmHg, moderately dilated LA, moderate pericardial effusion, no MS, moderate AS. Out patient cardiology follow-up recommended. Also with weight loss, melanous stools, and gingival bleeding. Von Willebrand's panel was negative.Treated for severe protein caloric malnutrition given frail appearance and albumin 1.5. CEA and CA 19-9 were normal. HGD dropped (nadir H/H 7.7/24.8). Patient declined endoscopy, and was given Ferrlecit IV and started on Protonix 40 mg BID x 30 days then daily. Zinc, Ensure, and vitamin C supplementation was initiated for malnutrition. ? ?By medication list, she is on prednisone 5 mg each morning. 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 ?  ?She is a same-day work-up, so anesthesia team to evaluate on the day of surgery.  Labs and EKG on arrival as indicated--last EKG was 02/06/22 when she presented with SVT/afib with RVR,  s/p adenosine, amiodarone.  ? ? ?VS:  ?BP Readings from Last 3 Encounters:  ?02/25/22 (!) 108/56  ?02/25/22 (!) 121/49  ?02/23/22 (!) 107/42   ? ?Pulse Readings from Last 3 Encounters:  ?02/25/22 62  ?02/25/22 (!) 57  ?02/23/22 64  ?  ? ?PROVIDERS: ?Ardith Dark, PA-C  is PCP ([facility]) ?Kathlyn Sacramento, MD is cardiologist with [facility], but switched to Eyvonne Left, MD with [place] in 10/2020 due to insurance purposes and only saw 11/17/20. More recently seen by [facility] during hospitalization 02/2021 and 01/2022.  ?- Dohmeier, Asencion Partridge, MD is neurologist. New evaluation 12/13/19 for short term memory problems on 03/16/21 and 03/17/21. ?- Donah Driver, MD is rheumatologist ?- Truitt Merle, MD is HEM. Saw during 01/2022 admission. ?- [facility] ? ? ?LABS: Most recent lab results include: ?Lab Results  ?Component Value Date  ? WBC 10.0 02/25/2022  ? HGB 8.2 (L) 02/25/2022  ? HCT 26.7 (L) 02/25/2022  ? PLT 375 02/25/2022  ? GLUCOSE 128 (H) 02/25/2022  ? ALT 20 02/09/2022  ? AST 24 02/09/2022  ? NA 138 02/25/2022  ? K 4.4 02/25/2022  ? CL 107 02/25/2022  ? CREATININE 0.73 02/25/2022  ? BUN 17 02/25/2022  ? CO2 26 02/25/2022  ? TSH 4.449 02/06/2022  ? INR 1.3 (H) 02/09/2022  ? ? ? ?IMAGES: ?1V PCXR 02/22/22: ?FINDINGS: ?Cardiac and mediastinal contours are unchanged. Lower lung right ?greater than left hazy opacities, likely due to layering pleural ?effusions and atelectasis. No evidence of pneumothorax. ?IMPRESSION: ?New lower lung right greater than left hazy opacities, likely due to ?layering pleural effusions and atelectasis. ?  ?CT Head 02/06/22: ?IMPRESSION: ?1. No evidence of acute intracranial abnormality. ?2. Atrophy, chronic small-vessel white matter ischemic changes and ?remote RIGHT cerebellar infarct. ?  ? ?EKG: 02/06/22: See DISCUSSION. ?Supraventricular tachycardia ?Multiple premature complexes, vent & supraven ?Right axis deviation ?Repolarization abnormality, prob rate related ?Artifact in lead(s) I III aVL ?Confirmed by Davonna Belling [phone]) on 02/06/2022 1:07:55 PM ? ? ?CV: ?Aortogram with BLE runoff  02/15/22: ?Findings: Aorta and iliac segments are free of flow-limiting stenosis.  Left lower extremity she appears to have inline flow via at least the anterior tibial artery all the way to the level of the ankle.  On the right side the SFA is large popliteal is large both are patent with no flow-limiting stenosis.  The anterior tibial and posterior tibial arteries are occluded the peroneal artery is at least 3 and half millimeters and it provides inline flow to the ankle and at that level gives off a plantar branch to the foot. ?- No intervention was undertaken as patient has inline flow via peroneal artery and no posterior tibial artery for treatment to help with wound healing. ? ? ?Echo 02/08/22 (during admission for RLE cellulitis/abscess, afib with RVR): ?IMPRESSIONS  ? 1. Left ventricular ejection fraction, by estimation, is 60 to 65%. The  ?left ventricle has normal function. The left ventricle has no regional  ?wall motion abnormalities. Left ventricular diastolic parameters are  ?consistent with Grade II diastolic  ?dysfunction (pseudonormalization). Elevated left ventricular end-diastolic  ?pressure.  ? 2. Right ventricular systolic function is normal. The right ventricular  ?size is normal. There is mildly elevated pulmonary artery systolic  ?pressure. The estimated right ventricular systolic pressure is 99991111 mmHg.  ? 3. Left atrial size was moderately dilated.  ? 4. The pericardial effusion is anterior to the right ventricle and  ?surrounding the apex. Moderate pleural effusion in  the left lateral  ?region.  ? 5. The mitral valve is normal in structure. Mild mitral valve  ?regurgitation. No evidence of mitral stenosis. Severe mitral annular  ?calcification.  ? 6. The aortic valve is normal in structure. There is severe calcifcation  ?of the aortic valve. There is severe thickening of the aortic valve.  ?Aortic valve regurgitation is not visualized. Moderate aortic valve  ?stenosis. Aortic valve area, by  VTI  ?measures 1.67 cm?Marland Kitchen Aortic valve mean gradient measures 24.0 mmHg. Aortic  ?valve Vmax measures 3.30 m/s.  ? 7. The inferior vena cava is dilated in size with >50% respiratory  ?variability, suggesting

## 2022-03-11 NOTE — Anesthesia Preprocedure Evaluation (Addendum)
Anesthesia Evaluation  ?Patient identified by MRN, date of birth, ID band ?Patient awake ? ? ? ?Reviewed: ?Allergy & Precautions, NPO status , Patient's Chart, lab work & pertinent test results ? ?Airway ?Mallampati: II ? ?TM Distance: >3 FB ?Neck ROM: Full ? ? ? Dental ?no notable dental hx. ? ?  ?Pulmonary ?neg pulmonary ROS, former smoker,  ?  ?Pulmonary exam normal ?breath sounds clear to auscultation ? ? ? ? ? ? Cardiovascular ?hypertension, Pt. on medications ?+ Peripheral Vascular Disease  ?Normal cardiovascular exam ?Rhythm:Regular Rate:Normal ? ?cho 02/08/22 (during admission for RLE cellulitis/abscess, afib with RVR): ?IMPRESSIONS  ??1. Left ventricular ejection fraction, by estimation, is 60 to 65%. The  ?left ventricle has normal function. The left ventricle has no regional  ?wall motion abnormalities. Left ventricular diastolic parameters are  ?consistent with Grade II diastolic  ?dysfunction (pseudonormalization). Elevated left ventricular end-diastolic  ?pressure.  ??2. Right ventricular systolic function is normal. The right ventricular  ?size is normal. There is mildly elevated pulmonary artery systolic  ?pressure. The estimated right ventricular systolic pressure is 99991111 mmHg.  ??3. Left atrial size was moderately dilated.  ??4. The pericardial effusion is anterior to the right ventricle and  ?surrounding the apex. Moderate pleural effusion in the left lateral  ?region.  ??5. The mitral valve is normal in structure. Mild mitral valve  ?regurgitation. No evidence of mitral stenosis. Severe mitral annular  ?calcification.  ??6. The aortic valve is normal in structure. There is severe calcifcation  ?of the aortic valve. There is severe thickening of the aortic valve.  ?Aortic valve regurgitation is not visualized. Moderate aortic valve  ?stenosis. Aortic valve area, by VTI  ?measures 1.67 cm?Marland Kitchen Aortic valve mean gradient measures 24.0 mmHg. Aortic  ?valve Vmax measures  3.30 m/s.  ??7. The inferior vena cava is dilated in size with >50% respiratory  ?variability, suggesting right atrial pressure of 8 mmHg.  ??8. Compared to echo dated 03/17/2021, the mean AVG has decreased from  ?68mmHg to 93mmHg, AV VTi has decreased from 3.41m/s to 3.3 m/s and DVI has  ?increased from 0.28 to 0.44.  ? ? ?  ?Neuro/Psych ? Headaches, Depression CVA negative psych ROS  ? GI/Hepatic ?Neg liver ROS, GERD  ,  ?Endo/Other  ?Hypothyroidism  ? Renal/GU ?negative Renal ROS  ?negative genitourinary ?  ?Musculoskeletal ? ?(+) Arthritis , Osteoarthritis,   ? Abdominal ?  ?Peds ?negative pediatric ROS ?(+)  Hematology ? ?(+) Blood dyscrasia, anemia ,   ?Anesthesia Other Findings ? ? Reproductive/Obstetrics ?negative OB ROS ? ?  ? ? ? ? ? ? ? ? ? ? ? ? ? ?  ?  ? ? ? ? ? ? ? ?Anesthesia Physical ?Anesthesia Plan ? ?ASA: 3 ? ?Anesthesia Plan: General  ? ?Post-op Pain Management:   ? ?Induction: Intravenous ? ?PONV Risk Score and Plan: 3 and Ondansetron, Dexamethasone, Midazolam and Treatment may vary due to age or medical condition ? ?Airway Management Planned: LMA ? ?Additional Equipment:  ? ?Intra-op Plan:  ? ?Post-operative Plan: Extubation in OR ? ?Informed Consent: I have reviewed the patients History and Physical, chart, labs and discussed the procedure including the risks, benefits and alternatives for the proposed anesthesia with the patient or authorized representative who has indicated his/her understanding and acceptance.  ? ? ? ?Dental advisory given ? ?Plan Discussed with: CRNA ? ?Anesthesia Plan Comments: (See PAT note written 03/11/2022 by Myra Gianotti, PA-C. ?)  ? ? ? ? ? ?Anesthesia Quick Evaluation ? ?

## 2022-03-11 NOTE — Progress Notes (Signed)
PT is at [facility] with Trudee Grip, RN. Pre-op instructions written down by RN. Pt will arrive at [facility]" at [phone]. NPO after midnight. Pt can take PTA meds Amiodarone, Synthroid, Claritin, Protonix, and pain medication as needed with small sip of water in the morning DOS. No further questions.  ? ?[phone] if any questions ?

## 2022-03-12 ENCOUNTER — Other Ambulatory Visit: Payer: Self-pay

## 2022-03-12 ENCOUNTER — Encounter (HOSPITAL_COMMUNITY)
Admission: RE | Disposition: A | Discharge status: Rehabilitation Facility | Payer: Self-pay | Source: Home / Self Care | Attending: Orthopedic Surgery

## 2022-03-12 ENCOUNTER — Telehealth: Payer: Self-pay | Admitting: Orthopedic Surgery

## 2022-03-12 ENCOUNTER — Ambulatory Visit (HOSPITAL_COMMUNITY): Payer: Medicare Other | Admitting: Vascular Surgery

## 2022-03-12 ENCOUNTER — Encounter (HOSPITAL_COMMUNITY): Payer: Self-pay | Admitting: Orthopedic Surgery

## 2022-03-12 ENCOUNTER — Ambulatory Visit (HOSPITAL_BASED_OUTPATIENT_CLINIC_OR_DEPARTMENT_OTHER): Payer: Medicare Other | Admitting: Vascular Surgery

## 2022-03-12 ENCOUNTER — Ambulatory Visit (HOSPITAL_COMMUNITY)
Admission: RE | Admit: 2022-03-12 | Discharge: 2022-03-12 | Disposition: A | Discharge status: Rehabilitation Facility | Payer: Medicare Other | Attending: Orthopedic Surgery | Admitting: Orthopedic Surgery

## 2022-03-12 DIAGNOSIS — E039 Hypothyroidism, unspecified: Secondary | ICD-10-CM | POA: Diagnosis not present

## 2022-03-12 DIAGNOSIS — T8131XA Disruption of external operation (surgical) wound, not elsewhere classified, initial encounter: Secondary | ICD-10-CM | POA: Diagnosis present

## 2022-03-12 DIAGNOSIS — I739 Peripheral vascular disease, unspecified: Secondary | ICD-10-CM | POA: Insufficient documentation

## 2022-03-12 DIAGNOSIS — Z9889 Other specified postprocedural states: Secondary | ICD-10-CM | POA: Insufficient documentation

## 2022-03-12 DIAGNOSIS — D649 Anemia, unspecified: Secondary | ICD-10-CM | POA: Insufficient documentation

## 2022-03-12 DIAGNOSIS — Y832 Surgical operation with anastomosis, bypass or graft as the cause of abnormal reaction of the patient, or of later complication, without mention of misadventure at the time of the procedure: Secondary | ICD-10-CM | POA: Diagnosis not present

## 2022-03-12 DIAGNOSIS — S81801A Unspecified open wound, right lower leg, initial encounter: Secondary | ICD-10-CM

## 2022-03-12 DIAGNOSIS — L02415 Cutaneous abscess of right lower limb: Secondary | ICD-10-CM | POA: Diagnosis not present

## 2022-03-12 DIAGNOSIS — I872 Venous insufficiency (chronic) (peripheral): Secondary | ICD-10-CM | POA: Insufficient documentation

## 2022-03-12 DIAGNOSIS — I10 Essential (primary) hypertension: Secondary | ICD-10-CM | POA: Diagnosis not present

## 2022-03-12 DIAGNOSIS — D638 Anemia in other chronic diseases classified elsewhere: Secondary | ICD-10-CM | POA: Diagnosis not present

## 2022-03-12 DIAGNOSIS — K219 Gastro-esophageal reflux disease without esophagitis: Secondary | ICD-10-CM | POA: Insufficient documentation

## 2022-03-12 DIAGNOSIS — Z87891 Personal history of nicotine dependence: Secondary | ICD-10-CM

## 2022-03-12 DIAGNOSIS — D759 Disease of blood and blood-forming organs, unspecified: Secondary | ICD-10-CM | POA: Diagnosis not present

## 2022-03-12 DIAGNOSIS — Y813 Surgical instruments, materials and general- and plastic-surgery devices (including sutures) associated with adverse incidents: Secondary | ICD-10-CM | POA: Insufficient documentation

## 2022-03-12 HISTORY — PX: APPLICATION OF WOUND VAC: SHX5189

## 2022-03-12 HISTORY — PX: SKIN FULL THICKNESS GRAFT: SHX442

## 2022-03-12 HISTORY — PX: I & D EXTREMITY: SHX5045

## 2022-03-12 HISTORY — DX: Cerebral infarction, unspecified: I63.9

## 2022-03-12 SURGERY — IRRIGATION AND DEBRIDEMENT EXTREMITY
Anesthesia: General | Laterality: Right

## 2022-03-12 MED ORDER — LIDOCAINE 2% (20 MG/ML) 5 ML SYRINGE
INTRAMUSCULAR | Status: DC | PRN
Start: 2022-03-12 — End: 2022-03-12
  Administered 2022-03-12: 40 mg via INTRAVENOUS

## 2022-03-12 MED ORDER — FENTANYL CITRATE (PF) 250 MCG/5ML IJ SOLN
INTRAMUSCULAR | Status: DC | PRN
  Administered 2022-03-12: 50 ug via INTRAVENOUS

## 2022-03-12 MED ORDER — PROPOFOL 10 MG/ML IV BOLUS
INTRAVENOUS | Status: DC | PRN
  Administered 2022-03-12: 150 mg via INTRAVENOUS

## 2022-03-12 MED ORDER — PROMETHAZINE HCL 25 MG/ML IJ SOLN: 6.2500 mg | INTRAMUSCULAR | Status: DC | PRN

## 2022-03-12 MED ORDER — LACTATED RINGERS IV SOLN: INTRAVENOUS | Status: DC

## 2022-03-12 MED ORDER — AMISULPRIDE (ANTIEMETIC) 5 MG/2ML IV SOLN: 10.0000 mg | Freq: Once | INTRAVENOUS | Status: DC | PRN

## 2022-03-12 MED ORDER — ONDANSETRON HCL 4 MG/2ML IJ SOLN
INTRAMUSCULAR | Status: AC
  Filled 2022-03-12: qty 2

## 2022-03-12 MED ORDER — ORAL CARE MOUTH RINSE: 15.0000 mL | Freq: Once | OROMUCOSAL | Status: AC

## 2022-03-12 MED ORDER — PROPOFOL 10 MG/ML IV BOLUS
INTRAVENOUS | Status: AC
  Filled 2022-03-12: qty 20

## 2022-03-12 MED ORDER — FENTANYL CITRATE (PF) 250 MCG/5ML IJ SOLN
INTRAMUSCULAR | Status: AC
  Filled 2022-03-12: qty 5

## 2022-03-12 MED ORDER — EPHEDRINE SULFATE-NACL 50-0.9 MG/10ML-% IV SOSY
PREFILLED_SYRINGE | INTRAVENOUS | Status: DC | PRN
  Administered 2022-03-12 (×3): 5 mg via INTRAVENOUS

## 2022-03-12 MED ORDER — 0.9 % SODIUM CHLORIDE (POUR BTL) OPTIME
TOPICAL | Status: DC | PRN
Start: 2022-03-12 — End: 2022-03-12
  Administered 2022-03-12: 1000 mL

## 2022-03-12 MED ORDER — EPHEDRINE 5 MG/ML INJ
INTRAVENOUS | Status: AC
  Filled 2022-03-12: qty 5

## 2022-03-12 MED ORDER — LIDOCAINE 2% (20 MG/ML) 5 ML SYRINGE
INTRAMUSCULAR | Status: AC
  Filled 2022-03-12: qty 5

## 2022-03-12 MED ORDER — CHLORHEXIDINE GLUCONATE 0.12 % MT SOLN
OROMUCOSAL | Status: AC
  Administered 2022-03-12: 15 mL via OROMUCOSAL
  Filled 2022-03-12: qty 15

## 2022-03-12 MED ORDER — ONDANSETRON HCL 4 MG/2ML IJ SOLN
INTRAMUSCULAR | Status: DC | PRN
Start: 2022-03-12 — End: 2022-03-12
  Administered 2022-03-12: 4 mg via INTRAVENOUS

## 2022-03-12 MED ORDER — CHLORHEXIDINE GLUCONATE 0.12 % MT SOLN: 15.0000 mL | Freq: Once | OROMUCOSAL | Status: AC

## 2022-03-12 MED ORDER — HYDROMORPHONE HCL 1 MG/ML IJ SOLN
INTRAMUSCULAR | Status: AC
  Filled 2022-03-12: qty 1

## 2022-03-12 MED ORDER — VANCOMYCIN HCL IN DEXTROSE 1-5 GM/200ML-% IV SOLN
1000.0000 mg | INTRAVENOUS | Status: AC
  Administered 2022-03-12: 1000 mg via INTRAVENOUS
  Filled 2022-03-12: qty 200

## 2022-03-12 MED ORDER — HYDROMORPHONE HCL 1 MG/ML IJ SOLN
0.2500 mg | INTRAMUSCULAR | Status: DC | PRN
  Administered 2022-03-12 (×2): 0.25 mg via INTRAVENOUS

## 2022-03-12 SURGICAL SUPPLY — 53 items
BAG COUNTER SPONGE SURGICOUNT (BAG) ×2 IMPLANT
BAG SPNG CNTER NS LX DISP (BAG) ×1
BLADE SURG 21 STRL SS (BLADE) ×2 IMPLANT
BNDG COHESIVE 6X5 TAN ST LF (GAUZE/BANDAGES/DRESSINGS) ×1 IMPLANT
BNDG COHESIVE 6X5 TAN STRL LF (GAUZE/BANDAGES/DRESSINGS) IMPLANT
BNDG ESMARK 4X9 LF (GAUZE/BANDAGES/DRESSINGS) ×2 IMPLANT
BNDG GAUZE ELAST 4 BULKY (GAUZE/BANDAGES/DRESSINGS) ×4 IMPLANT
COVER SURGICAL LIGHT HANDLE (MISCELLANEOUS) ×4 IMPLANT
CUFF TOURN SGL QUICK 18X4 (TOURNIQUET CUFF) IMPLANT
CUFF TOURN SGL QUICK 24 (TOURNIQUET CUFF)
CUFF TRNQT CYL 24X4X16.5-23 (TOURNIQUET CUFF) IMPLANT
DRAPE DERMATAC (DRAPES) ×3 IMPLANT
DRAPE U-SHAPE 47X51 STRL (DRAPES) ×2 IMPLANT
DRESSING VERAFLO CLEANS CC MED (GAUZE/BANDAGES/DRESSINGS) IMPLANT
DRSG ADAPTIC 3X8 NADH LF (GAUZE/BANDAGES/DRESSINGS) ×2 IMPLANT
DRSG VERAFLO CLEANSE CC MED (GAUZE/BANDAGES/DRESSINGS) ×2
DURAPREP 26ML APPLICATOR (WOUND CARE) ×2 IMPLANT
ELECT REM PT RETURN 9FT ADLT (ELECTROSURGICAL) ×2
ELECTRODE REM PT RTRN 9FT ADLT (ELECTROSURGICAL) ×1 IMPLANT
GAUZE SPONGE 4X4 12PLY STRL (GAUZE/BANDAGES/DRESSINGS) ×2 IMPLANT
GLOVE BIOGEL PI IND STRL 9 (GLOVE) ×1 IMPLANT
GLOVE BIOGEL PI INDICATOR 9 (GLOVE) ×1
GLOVE SURG ORTHO 9.0 STRL STRW (GLOVE) ×2 IMPLANT
GOWN STRL REUS W/ TWL XL LVL3 (GOWN DISPOSABLE) ×2 IMPLANT
GOWN STRL REUS W/TWL XL LVL3 (GOWN DISPOSABLE) ×4
GRAFT SKIN WND OMEGA3 SB 7X10 (Tissue) ×1 IMPLANT
GRAFT SKIN WND SURGICLOSE M95 (Tissue) ×1 IMPLANT
HANDPIECE INTERPULSE COAX TIP (DISPOSABLE)
KIT BASIN OR (CUSTOM PROCEDURE TRAY) ×2 IMPLANT
KIT TURNOVER KIT B (KITS) ×2 IMPLANT
MANIFOLD NEPTUNE II (INSTRUMENTS) ×2 IMPLANT
NDL HYPO 25GX1X1/2 BEV (NEEDLE) IMPLANT
NEEDLE HYPO 25GX1X1/2 BEV (NEEDLE) IMPLANT
NS IRRIG 1000ML POUR BTL (IV SOLUTION) ×2 IMPLANT
PACK ORTHO EXTREMITY (CUSTOM PROCEDURE TRAY) ×2 IMPLANT
PAD ARMBOARD 7.5X6 YLW CONV (MISCELLANEOUS) ×4 IMPLANT
PAD CAST 4YDX4 CTTN HI CHSV (CAST SUPPLIES) IMPLANT
PADDING CAST COTTON 4X4 STRL (CAST SUPPLIES)
PREVENA RESTOR AXIOFORM 29X28 (GAUZE/BANDAGES/DRESSINGS) ×1 IMPLANT
SET HNDPC FAN SPRY TIP SCT (DISPOSABLE) IMPLANT
STOCKINETTE IMPERVIOUS 9X36 MD (GAUZE/BANDAGES/DRESSINGS) IMPLANT
SUCTION FRAZIER HANDLE 10FR (MISCELLANEOUS)
SUCTION TUBE FRAZIER 10FR DISP (MISCELLANEOUS) IMPLANT
SUT ETHILON 2 0 PSLX (SUTURE) ×2 IMPLANT
SUT ETHILON 4 0 PS 2 18 (SUTURE) IMPLANT
SWAB COLLECTION DEVICE MRSA (MISCELLANEOUS) ×2 IMPLANT
SWAB CULTURE ESWAB REG 1ML (MISCELLANEOUS) IMPLANT
SYR CONTROL 10ML LL (SYRINGE) IMPLANT
TOWEL GREEN STERILE (TOWEL DISPOSABLE) ×2 IMPLANT
TOWEL GREEN STERILE FF (TOWEL DISPOSABLE) ×2 IMPLANT
TUBE CONNECTING 12X1/4 (SUCTIONS) ×2 IMPLANT
WATER STERILE IRR 1000ML POUR (IV SOLUTION) ×2 IMPLANT
YANKAUER SUCT BULB TIP NO VENT (SUCTIONS) ×2 IMPLANT

## 2022-03-12 NOTE — H&P (Signed)
Isabel Barnes is an 81 y.o. female.   ?Chief Complaint: Increasing swelling and wound breakdown right leg. ?HPI: Patient is an 81 year old woman who is status post right leg skin graft with debridement about 3 weeks ago.  Patient has been undergoing serial serial dressing changes.  Patient states she has been unable to elevate her foot and she presents with increasing swelling wound breakdown fibrinous exudative tissue and drainage. ? ?Past Medical History:  ?Diagnosis Date  ? Anemia   ? iron deficiency hx.has had iron infusions before   ? Chronic low back pain   ? Chronic pain disorder   ? Complication of anesthesia   ? severe claustrophobia  ? Constipation   ? r/t use of pain meds.Takes OTC meds or eats prunes  ? CVA (cerebral vascular accident) [name])   ? 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 Depression   ? GERD (gastroesophageal reflux disease)   ? takes Omeprazole daily  ? Heart murmur   ? mild MS, moderate-severe AS 03/17/21 echo  ? History of bronchitis   ? 20+ yrs ago  ? History of kidney stones   ? 3 surgerical removed, 1 passed  ? History of prolapse of bladder   ? History of shingles   ? Hypertension   ? takes Losartan daily  ? Hypothyroidism   ? takes Synthroid daily  ? Joint swelling   ? Neck pain   ? bone spurs at base of head per pt  ? Osteoarthritis   ? lumbar,cervical,joints  ? Pneumonia   ? hx of > 20 yrs ago  ? Shortness of breath   ? occasionally and with exertion. Albuterol inhaler as needed  ? Spinal headache 1991  ? blood patch placed  ? Spondylitis ([place])   ? Unsteady gait   ? occasionally  ? Urinary urgency   ? ? ?Past Surgical History:  ?Procedure Laterality Date  ? ABDOMINAL AORTOGRAM W/LOWER EXTREMITY N/A 02/15/2022  ? Procedure: ABDOMINAL AORTOGRAM W/LOWER EXTREMITY;  Surgeon: Waynetta Sandy, MD;  Location: [place] [facility];  Service: Cardiovascular;  Laterality: N/A;  ? ABDOMINAL HYSTERECTOMY    ? ANTERIOR FUSION CERVICAL SPINE    ? x2 -C4-7  ?  BUNIONECTOMY Bilateral   ? COLONOSCOPY    ? CYSTOSCOPY W/ URETEROSCOPY  2012  ? EYE SURGERY Bilateral   ? cataract /lens implant  ? HOLMIUM LASER APPLICATION Left [id number]  ? Procedure: HOLMIUM LASER APPLICATION;  Surgeon: Malka So, MD;  Location: [facility];  Service: Urology;  Laterality: Left;  ? I & D EXTREMITY Right 02/10/2022  ? Procedure: DEBRIDEMENT RIGHT LEG ABSCESS;  Surgeon: Newt Minion, MD;  Location: [place];  Service: Orthopedics;  Laterality: Right;  ? INSERTION OF MESH N/A 07/15/2014  ? Procedure: INSERTION OF MESH;  Surgeon: Adin Hector, MD;  Location: [place];  Service: General;  Laterality: N/A;  ? JOINT REPLACEMENT Right 2012  ? shoulder  ? LAPAROSCOPIC CHOLECYSTECTOMY W/ CHOLANGIOGRAPHY  2012  ? Dr Ninfa Linden  ? NASAL SINUS SURGERY    ? OPEN REDUCTION INTERNAL FIXATION (ORIF) DISTAL RADIAL FRACTURE Left 03/20/2021  ? Procedure: OPEN REDUCTION INTERNAL FIXATION (ORIF) DISTAL RADIAL FRACTURE;  Surgeon: Marchia Bond, MD;  Location: [place];  Service: Orthopedics;  Laterality: Left;  ? RADIOLOGY WITH ANESTHESIA N/A 05/09/2014  ? Procedure: ADULT SEDATION WITH ANESTHESIA/MRI CERVICAL SPINE WITHOUT CONTRAST;  Surgeon: Medication Radiologist, MD;  Location: [place];  Service: Radiology;  Laterality: N/A;  DR. HAWKS/MRI  ? right knee arthroscopy    ? d/t meniscal tear  ? SHOULDER ARTHROSCOPY W/ ROTATOR CUFF REPAIR Bilateral three times each over several yrs  ? SKIN SPLIT GRAFT Right 02/17/2022  ? Procedure: RIGHT LEG SKIN GRAFT;  Surgeon: Newt Minion, MD;  Location: [place];  Service: Orthopedics;  Laterality: Right;  ? THUMB ARTHROSCOPY Left   ? 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 Procedure: RIGHT TOTAL KNEE ARTHROPLASTY;  Surgeon: Dorna Leitz, MD;  Location: [place];  Service: Orthopedics;  Laterality: Right;  ? UMBILICAL HERNIA REPAIR N/A 07/15/2014  ? Procedure: LAPAROSCOPIC UMBILICAL AND INFRAUMBILICAL HERNIA;  Surgeon: Adin Hector, MD;  Location: [place];  Service: General;   Laterality: N/A;  ? ? ?Family History  ?Problem Relation Age of Onset  ? Stroke Father   ? ?Social History:  reports that she quit smoking about 30 years ago. Her smoking use included cigarettes. She has a 1.00 pack-year smoking history. She has never used smokeless tobacco. She reports that she does not drink alcohol and does not use drugs. ? ?Allergies:  ?Allergies  ?Allergen Reactions  ? Dilaudid [Hydromorphone Hcl] Shortness Of Breath  ? Gabapentin Other (See Comments)  ?  Hoarseness , headache and sore throat  ? Latex Rash  ?  Severe rash  ? Lyrica [Pregabalin] Other (See Comments)  ?  No balance , had to walk with cane , Blurred vision,weakness.  ? Oxycodone Shortness Of Breath and Other (See Comments)  ?  Tolerated oxycodone during admission to hospital 02/06/22  ? Singulair [Montelukast] Shortness Of Breath and Other (See Comments)  ?  Vision issues, also  ? Ciprofloxacin Hcl Nausea And Vomiting and Other (See Comments)  ?  Nausea and vomiting with by mouth form  ? Codeine Other (See Comments)  ?  Hallucinations  ? Methadone Nausea And Vomiting and Other (See Comments)  ?  Severe nausea and vomiting  ? Metronidazole Nausea And Vomiting and Other (See Comments)  ?  Gastric pain  ? Oysters [Shellfish Allergy] Other (See Comments)  ?  "Terrible gastric upset and cramping."  ? Clindamycin/Lincomycin Diarrhea and Nausea Only  ? Donepezil Diarrhea and Other (See Comments)  ?  Severe diarrhea  ? Sulfa Antibiotics Diarrhea and Other (See Comments)  ?  GI issues ?  ? Tape Other (See Comments)  ?  ADHESIVE TAPE-Severe rash  ? Zetia [Ezetimibe] Diarrhea  ? Elemental Sulfur Nausea And Vomiting  ? Iodine Rash  ? Other Rash  ?  All Antibiotic ointments/ creams  ? Oyster Shell Rash  ? Penicillin G Nausea And Vomiting and Rash  ? Penicillins Nausea And Vomiting and Rash  ?  Has patient had a PCN reaction causing immediate rash, facial/tongue/throat swelling, SOB or lightheadedness with hypotension: Yes ?Has patient had a PCN  reaction causing severe rash involving mucus membranes or skin necrosis: No ?Has patient had a PCN reaction that required hospitalization No ?Has patient had a PCN reaction occurring within the last 10 years: No ?If all of the above answers are "NO", then may proceed with Cephalosporin use. ?  ? Povidone Iodine Rash and Other (See Comments)  ?  Oyster shell products- Rash ?  ? Skintegrity Hydrogel [Skin Protectants, Misc.] Rash  ? Tapentadol Other (See Comments)  ?  Nightmares  ? ? ?No medications prior to admission.  ? ? ?No results found for this or any previous visit (from the past 48 hour(s)). ?No results found. ? ?Review of  Systems  ?All other systems reviewed and are negative. ? ?There were no vitals taken for this visit. ?Physical Exam  ?Patient is alert, oriented, no adenopathy, well-dressed, normal affect, normal respiratory effort. ?Examination patient has epiboly around the wound edges with stalled healing.  There is clear serous drainage.  There is increased swelling in the right lower extremity with lymphatic insufficiency.  There is approximately 25% fibrinous tissue over the wound bed.  There is no ascending cellulitis. ?Assessment/Plan ?Assessment: Progressive breakdown right leg status post skin debridement with increased swelling and venous and lymphatic insufficiency. ? ?Plan: We will plan for repeat debridement application of tissue graft as well as application of a wound VAC.  Risk and benefits were discussed including risk of additional surgery breakdown of the wound, plan for return to [facility] skilled nursing after surgery. ? ?Newt Minion, MD ?03/12/2022, 6:42 AM ? ? ? ?

## 2022-03-12 NOTE — Interval H&P Note (Signed)
History and Physical Interval Note: ? ?03/12/2022 ?8:23 AM ? ?Isabel Barnes  has presented today for surgery, with the diagnosis of CHRONIC WOUND RIGHT LEG.  The various methods of treatment have been discussed with the patient and family. After consideration of risks, benefits and other options for treatment, the patient has consented to  Procedure(s): ?RIGHT LEG IRRIGATION AND DEBRIDEMENT EXTREMITY (Right) ?SKIN GRAFT FULL THICKNESS (Right) as a surgical intervention.  The patient's history has been reviewed, patient examined, no change in status, stable for surgery.  I have reviewed the patient's chart and labs.  Questions were answered to the patient's satisfaction.   ? ? ?Newt Minion ? ? ?

## 2022-03-12 NOTE — Anesthesia Postprocedure Evaluation (Signed)
Anesthesia Post Note ? ?Patient: LINELL MELDRUM ? ?Procedure(s) Performed: RIGHT LEG IRRIGATION AND DEBRIDEMENT EXTREMITY (Right) ?SKIN GRAFT FULL THICKNESS (Right) ?APPLICATION OF WOUND VAC (Right) ? ?  ? ?Patient location during evaluation: PACU ?Anesthesia Type: General ?Level of consciousness: awake and alert ?Pain management: pain level controlled ?Vital Signs Assessment: post-procedure vital signs reviewed and stable ?Respiratory status: spontaneous breathing, nonlabored ventilation and respiratory function stable ?Cardiovascular status: blood pressure returned to baseline and stable ?Postop Assessment: no apparent nausea or vomiting ?Anesthetic complications: no ? ? ?No notable events documented. ? ?Last Vitals:  ?Vitals:  ? 03/12/22 0951 03/12/22 1006  ?BP: 135/62 (!) 143/59  ?Pulse: 63 (!) 59  ?Resp: 19 12  ?Temp:  (!) 36.2 ?C  ?SpO2: 99% 100%  ?  ?Last Pain:  ?Vitals:  ? 03/12/22 1006  ?TempSrc:   ?PainSc: Asleep  ? ? ?  ?  ?  ?  ?  ?  ? ?Lowella Curb ? ? ? ? ?

## 2022-03-12 NOTE — Telephone Encounter (Signed)
Noted  

## 2022-03-12 NOTE — Progress Notes (Signed)
Medication list and last doses verified with nurse from SNF ?

## 2022-03-12 NOTE — Anesthesia Procedure Notes (Signed)
Procedure Name: LMA Insertion ?Date/Time: 03/12/2022 8:44 AM ?Performed by: Cy Blamer, CRNA ?Pre-anesthesia Checklist: Patient identified, Emergency Drugs available, Suction available and Patient being monitored ?Patient Re-evaluated:Patient Re-evaluated prior to induction ?Oxygen Delivery Method: Circle system utilized ?Preoxygenation: Pre-oxygenation with 100% oxygen ?Induction Type: IV induction ?LMA: LMA inserted ?LMA Size: 4.0 ?Tube type: Oral ?Number of attempts: 1 ?Placement Confirmation: positive ETCO2 and breath sounds checked- equal and bilateral ?Tube secured with: Tape ?Dental Injury: Teeth and Oropharynx as per pre-operative assessment  ? ? ? ? ?

## 2022-03-12 NOTE — Transfer of Care (Signed)
Immediate Anesthesia Transfer of Care Note ? ?Patient: Isabel Barnes ? ?Procedure(s) Performed: RIGHT LEG IRRIGATION AND DEBRIDEMENT EXTREMITY (Right) ?SKIN GRAFT FULL THICKNESS (Right) ?APPLICATION OF WOUND VAC (Right) ? ?Patient Location: PACU ? ?Anesthesia Type:General ? ?Level of Consciousness: awake, alert  and oriented ? ?Airway & Oxygen Therapy: Patient Spontanous Breathing ? ?Post-op Assessment: Report given to RN, Post -op Vital signs reviewed and stable, Patient moving all extremities X 4 and Patient able to stick tongue midline ? ?Post vital signs: Reviewed ? ?Last Vitals:  ?Vitals Value Taken Time  ?BP 132/83 03/12/22 0922  ?Temp 97.2   ?Pulse 62 03/12/22 0924  ?Resp 12 03/12/22 0924  ?SpO2 99 % 03/12/22 0924  ?Vitals shown include unvalidated device data. ? ?Last Pain:  ?Vitals:  ? 03/12/22 0728  ?TempSrc:   ?PainSc: 8   ?   ? ?  ? ?Complications: No notable events documented. ?

## 2022-03-12 NOTE — Op Note (Addendum)
03/12/2022 ? ?9:27 AM ? ?PATIENT:  Isabel Barnes   ? ?PRE-OPERATIVE DIAGNOSIS:  CHRONIC WOUND RIGHT LEG ? ?POST-OPERATIVE DIAGNOSIS:  Same ? ?PROCEDURE:  RIGHT LEG IRRIGATION AND DEBRIDEMENT EXTREMITY, SKIN GRAFT FULL THICKNESS, APPLICATION OF WOUND VAC ? ?SURGEON:  Nadara Mustard, MD ? ?PHYSICIAN ASSISTANT:None ?ANESTHESIA:   General ? ?PREOPERATIVE INDICATIONS:  Isabel Barnes is a  81 y.o. female with a diagnosis of CHRONIC WOUND RIGHT LEG who failed conservative measures and elected for surgical management.   ? ?The risks benefits and alternatives were discussed with the patient preoperatively including but not limited to the risks of infection, bleeding, nerve injury, cardiopulmonary complications, the need for revision surgery, among others, and the patient was willing to proceed. ? ?OPERATIVE IMPLANTS: Kerecis sheet 7 x 10 cm and Kerecis micro powder 95 cm?. ? ?Cleanse choice wound VAC sponge x1 and axial form wound VAC sponge x1. ? ?@ENCIMAGES @ ? ?OPERATIVE FINDINGS: Good granulation tissue at the wound bed after debridement. ? ?OPERATIVE PROCEDURE: Patient was brought the operating room and underwent a general anesthetic.  After adequate levels anesthesia were obtained patient's right lower extremity was prepped using DuraPrep draped into a sterile field a timeout was called.  A Cobb elevator was used to debride skin and soft tissue muscle and fascia from the wound.  A rondure was also used.  After debridement the wound was 10 x 15 cm.  The wound was irrigated normal saline electrocautery was used hemostasis.  The 95 cm? of micro powder was applied to the wound the 7 x 10 cm Kerecis graft was then applied secured with staples.  The open wound was covered with the blue cleanse choice sponge this was then overwrapped with the axial form outlined with derma tack covered with Coban to the mid thigh.  This was connected to the portable Praveena wound VAC pump this had a good suction fit patient was  extubated taken the PACU in stable condition. ? ?Debridement type: Excisional Debridement ? ?Side: right ? ?Body Location: leg  ? ?Tools used for debridement: scalpel and rongeur ? ?Pre-debridement Wound size (cm):   Length: 10        Width: 15     Depth: 1  ? ?Post-debridement Wound size (cm):   Length: 10        Width: 15     Depth: 1  ? ?Debridement depth beyond dead/damaged tissue down to healthy viable tissue: yes ? ?Tissue layer involved: skin, subcutaneous tissue, muscle / fascia ? ?Nature of tissue removed: Slough and Non-viable tissue ? ?Irrigation volume: 1 liter    ? ?Irrigation fluid type: Normal Saline ? ? ? ?DISCHARGE PLANNING: ? ?Antibiotic duration: Preoperative antibiotics ? ?Weightbearing: Weightbearing as tolerated ? ?Pain medication: Continue current medications ? ?Dressing care/ Wound VAC: Continue wound VAC for 1 week ? ?Ambulatory devices: Walker ? ?Discharge to: Skilled nursing facility ? ?Follow-up: In the office 1 week post operative. ? ? ? ? ? ? ?  ?

## 2022-03-12 NOTE — Telephone Encounter (Signed)
Pt is sch for surgery today. She will spend several days in the hospital and then be d/c back to nursing facility. I tried to call daughter no answer and message states customer unavailable This will be facilitated by case management/ social work at the hsoptial.  ?

## 2022-03-12 NOTE — Telephone Encounter (Signed)
Pt's daughter Margarita Grizzle called requesting a call back. Pt's daughter states that pt is being discharged from nursing home facility today and is asking for skilled nursing. Pt's [place]. Margarita Grizzle is asking for Autumn F to call her back at [phone]. ?

## 2022-03-15 ENCOUNTER — Non-Acute Institutional Stay (SKILLED_NURSING_FACILITY): Payer: Medicare Other | Admitting: Adult Health

## 2022-03-15 ENCOUNTER — Encounter (HOSPITAL_COMMUNITY): Payer: Self-pay | Admitting: Orthopedic Surgery

## 2022-03-15 DIAGNOSIS — R0602 Shortness of breath: Secondary | ICD-10-CM

## 2022-03-15 DIAGNOSIS — S81801S Unspecified open wound, right lower leg, sequela: Secondary | ICD-10-CM | POA: Diagnosis not present

## 2022-03-15 DIAGNOSIS — S81801D Unspecified open wound, right lower leg, subsequent encounter: Secondary | ICD-10-CM | POA: Diagnosis not present

## 2022-03-15 NOTE — Progress Notes (Signed)
? ?Location:  [facility] ?Nursing Home Room Number: 209-A ?Place of Service:  SNF (31) ?Provider:  Kenard Gower, DNP, FNP-BC ? ?Patient Care Team: ?Rubye Beach as PCP - General (Physician Assistant) ?Iran Ouch, MD as PCP - Cardiology (Cardiology) ?Hedgecock, Orlene Plum as Advice worker (Physician Geophysicist/field seismologist) ? ?Extended Emergency Contact Information ?Primary Emergency Contact: Lane,Mark ?Work Phone: [phone] ?Mobile Phone: [phone] ?Relation: Son ?Secondary Emergency Contact: Lee,Deanna ?Mobile Phone: [phone] ?Relation: Neighbor ?Interpreter needed? No ? ?Code Status:  Full Code ? ?Goals of care: Advanced Directive information ? ?  03/15/2022  ?  4:21 PM  ?Advanced Directives  ?Does Patient Have a Medical Advance Directive? Yes  ?Does patient want to make changes to medical advance directive? No - Patient declined  ?Pre-existing out of facility DNR order (yellow form or pink MOST form) Pink MOST form placed in chart (order not valid for inpatient use)  ? ? ? ?Chief Complaint  ?Patient presents with  ? Acute Visit  ?  Shortness of breath  ? ? ?HPI:  ?Pt is a 81 y.o. female seen today for an acute visit for shortness of breath. She is a short-term care resident of [facility]. She was seen in her room today with 3 church friends visiting. She stated that she is short of breath,  O2 sat  96%. No noted dyspnea. She stated that she has productive cough. She has a right lower leg wound with a recent right leg irrigation and debridement done on 03/12/2022.  Wound VAC was attached to right lower leg, as well. ? ? ?Past Medical History:  ?Diagnosis Date  ? Anemia   ? iron deficiency hx.has had iron infusions before   ? Chronic low back pain   ? Chronic pain disorder   ? Complication of anesthesia   ? severe claustrophobia  ? Constipation   ? r/t use of pain meds.Takes OTC meds or eats prunes  ? CVA (cerebral vascular accident) [facility])   ? remote  right cerebellar infarct noted on 02/06/22 head CT  ? Depression   ? GERD (gastroesophageal reflux disease)   ? takes Omeprazole daily  ? Heart murmur   ? mild MS, moderate-severe AS 03/17/21 echo  ? History of bronchitis   ? 20+ yrs ago  ? History of kidney stones   ? 3 surgerical removed, 1 passed  ? History of prolapse of bladder   ? History of shingles   ? Hypertension   ? takes Losartan daily  ? Hypothyroidism   ? takes Synthroid daily  ? Joint swelling   ? Neck pain   ? bone spurs at base of head per pt  ? Osteoarthritis   ? lumbar,cervical,joints  ? Pneumonia   ? hx of > 20 yrs ago  ? Shortness of breath   ? occasionally and with exertion. Albuterol inhaler as needed  ? Spinal headache 1991  ? blood patch placed  ? 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 Unsteady gait   ? occasionally  ? Urinary urgency   ? ?Past Surgical History:  ?Procedure Laterality Date  ? ABDOMINAL AORTOGRAM W/LOWER EXTREMITY N/A 02/15/2022  ? Procedure: ABDOMINAL AORTOGRAM W/LOWER EXTREMITY;  Surgeon: Maeola Harman, MD;  Location: [facility] INVASIVE CV LAB;  Service: Cardiovascular;  Laterality: N/A;  ? ABDOMINAL HYSTERECTOMY    ? ANTERIOR FUSION CERVICAL SPINE    ? x2 -C4-7  ? APPLICATION OF WOUND VAC Right 03/12/2022  ? Procedure: APPLICATION OF WOUND VAC;  Surgeon: Aldean Baker  V, MD;  [facility]: [facility] OR;  Service: Orthopedics;  Laterality: Right;  ? BUNIONECTOMY Bilateral   ? COLONOSCOPY    ? CYSTOSCOPY W/ URETEROSCOPY  2012  ? EYE SURGERY Bilateral   ? cataract /lens implant  ? HOLMIUM LASER APPLICATION Left 02/08/2013  ? Procedure: HOLMIUM LASER APPLICATION;  Surgeon: Anner Crete, MD;  Location: [facility];  Service: Urology;  Laterality: Left;  ? I & D EXTREMITY Right 02/10/2022  ? Procedure: DEBRIDEMENT RIGHT LEG ABSCESS;  Surgeon: Nadara Mustard, MD;  Location: [facility] OR;  Service: Orthopedics;  Laterality: Right;  ? I & D EXTREMITY Right 03/12/2022  ? Procedure: RIGHT LEG IRRIGATION AND DEBRIDEMENT EXTREMITY;  Surgeon: Nadara Mustard,  MD;  Location: [facility] OR;  Service: Orthopedics;  Laterality: Right;  ? INSERTION OF MESH N/A 07/15/2014  ? Procedure: INSERTION OF MESH;  Surgeon: Ardeth Sportsman, MD;  [facility] OR;  Service: General;  Laterality: N/A;  ? JOINT REPLACEMENT Right 2012  ? shoulder  ? LAPAROSCOPIC CHOLECYSTECTOMY W/ CHOLANGIOGRAPHY  2012  ? Dr Magnus Ivan  ? NASAL SINUS SURGERY    ? OPEN REDUCTION INTERNAL FIXATION (ORIF) DISTAL RADIAL FRACTURE Left 03/20/2021  ? Procedure: OPEN REDUCTION INTERNAL FIXATION (ORIF) DISTAL RADIAL FRACTURE;  Surgeon: Teryl Lucy, MD;  [facility]: [facility] OR;  Service: Orthopedics;  Laterality: Left;  ? RADIOLOGY WITH ANESTHESIA N/A 05/09/2014  ? Procedure: ADULT SEDATION WITH ANESTHESIA/MRI CERVICAL SPINE WITHOUT CONTRAST;  Surgeon: Medication Radiologist, MD;  [facility]: [facility] OR;  Service: Radiology;  Laterality: N/A;  DR. HAWKS/MRI  ? right knee arthroscopy    ? d/t meniscal tear  ? SHOULDER ARTHROSCOPY W/ ROTATOR CUFF REPAIR Bilateral three times each over several yrs  ? SKIN FULL THICKNESS GRAFT Right 03/12/2022  ? Procedure: SKIN GRAFT FULL THICKNESS;  Surgeon: Nadara Mustard, MD;  Location: [facility] OR;  Service: Orthopedics;  Laterality: Right;  ? SKIN SPLIT GRAFT Right 02/17/2022  ? Procedure: RIGHT LEG SKIN GRAFT;  Surgeon: Nadara Mustard, MD;  Location: [facility] OR;  Service: Orthopedics;  Laterality: Right;  ? THUMB ARTHROSCOPY Left   ? 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 Procedure: RIGHT TOTAL KNEE ARTHROPLASTY;  Surgeon: Jodi Geralds, MD;  [facility];  Service: Orthopedics;  Laterality: Right;  ? UMBILICAL HERNIA REPAIR N/A 07/15/2014  ? Procedure: LAPAROSCOPIC UMBILICAL AND INFRAUMBILICAL HERNIA;  Surgeon: Ardeth Sportsman, MD;  [facility] OR;  Service: General;  Laterality: N/A;  ? ? ?Allergies  ?Allergen Reactions  ? Dilaudid [Hydromorphone Hcl] Shortness Of Breath  ? Gabapentin Other (See Comments)  ?  Hoarseness , headache and sore throat  ? Latex Rash  ?  Severe rash  ? Lyrica [Pregabalin] Other (See  Comments)  ?  No balance , had to walk with cane , Blurred vision,weakness.  ? Oxycodone Shortness Of Breath and Other (See Comments)  ?  Tolerated oxycodone during admission to hospital 02/06/22  ? Singulair [Montelukast] Shortness Of Breath and Other (See Comments)  ?  Vision issues, also  ? Ciprofloxacin Hcl Nausea And Vomiting and Other (See Comments)  ?  Nausea and vomiting with by mouth form  ? Codeine Other (See Comments)  ?  Hallucinations  ? Methadone Nausea And Vomiting and Other (See Comments)  ?  Severe nausea and vomiting  ? Metronidazole Nausea And Vomiting and Other (See Comments)  ?  Gastric pain  ? Oysters [Shellfish Allergy] Other (See Comments)  ?  "Terrible gastric upset and cramping."  ?  Clindamycin/Lincomycin Diarrhea and Nausea Only  ? Donepezil Diarrhea and Other (See Comments)  ?  Severe diarrhea  ? Sulfa Antibiotics Diarrhea and Other (See Comments)  ?  GI issues ?  ? Tape Other (See Comments)  ?  ADHESIVE TAPE-Severe rash  ? Zetia [Ezetimibe] Diarrhea  ? Elemental Sulfur Nausea And Vomiting  ? Iodine Rash  ? Other Rash  ?  All Antibiotic ointments/ creams  ? Oyster Shell Rash  ? Penicillin G Nausea And Vomiting and Rash  ? Penicillins Nausea And Vomiting and Rash  ?  Has patient had a PCN reaction causing immediate rash, facial/tongue/throat swelling, SOB or lightheadedness with hypotension: Yes ?Has patient had a PCN reaction causing severe rash involving mucus membranes or skin necrosis: No ?Has patient had a PCN reaction that required hospitalization No ?Has patient had a PCN reaction occurring within the last 10 years: No ?If all of the above answers are "NO", then may proceed with Cephalosporin use. ?  ? Povidone Iodine Rash and Other (See Comments)  ?  Oyster shell products- Rash ?  ? Skintegrity Hydrogel [Skin Protectants, Misc.] Rash  ? Tapentadol Other (See Comments)  ?  Nightmares  ? ? ?Outpatient Encounter Medications as of 03/15/2022  ?Medication Sig  ? acetaminophen (TYLENOL)  500 MG tablet Take 2 tablets (1,000 mg total) by mouth 3 (three) times daily.  ? albuterol (VENTOLIN HFA) 108 (90 Base) MCG/ACT inhaler Inhale 2 puffs into the lungs every 4 (four) hours as needed for shor

## 2022-03-16 ENCOUNTER — Other Ambulatory Visit: Payer: Self-pay | Admitting: Adult Health

## 2022-03-16 ENCOUNTER — Other Ambulatory Visit: Payer: Self-pay | Admitting: Internal Medicine

## 2022-03-16 LAB — BASIC METABOLIC PANEL
BUN: 9 (ref 4–21)
CO2: 27 — AB (ref 13–22)
Chloride: 102 (ref 99–108)
Creatinine: 0.6 (ref 0.5–1.1)
Glucose: 74
Potassium: 4.3 mEq/L (ref 3.5–5.1)
Sodium: 133 — AB (ref 137–147)

## 2022-03-16 LAB — CBC AND DIFFERENTIAL
HCT: 26 — AB (ref 36–46)
Hemoglobin: 8.6 — AB (ref 12.0–16.0)
Platelets: 272 10*3/uL (ref 150–400)
WBC: 6.9

## 2022-03-16 LAB — CBC: RBC: 2.96 — AB (ref 3.87–5.11)

## 2022-03-16 LAB — COMPREHENSIVE METABOLIC PANEL: Calcium: 8.4 — AB (ref 8.7–10.7)

## 2022-03-16 MED ORDER — CEFDINIR 300 MG PO CAPS: 300.0000 mg | ORAL_CAPSULE | Freq: Two times a day (BID) | ORAL | 0 refills | Status: DC

## 2022-03-19 ENCOUNTER — Other Ambulatory Visit: Payer: Self-pay | Admitting: Adult Health

## 2022-03-19 MED ORDER — OXYCODONE HCL 5 MG PO TABS: 5.0000 mg | ORAL_TABLET | Freq: Four times a day (QID) | ORAL | 0 refills | Status: DC | PRN

## 2022-03-22 ENCOUNTER — Non-Acute Institutional Stay (SKILLED_NURSING_FACILITY): Payer: Medicare Other | Admitting: Adult Health

## 2022-03-22 ENCOUNTER — Telehealth: Payer: Self-pay | Admitting: Orthopedic Surgery

## 2022-03-22 ENCOUNTER — Ambulatory Visit (INDEPENDENT_AMBULATORY_CARE_PROVIDER_SITE_OTHER): Payer: Medicare Other | Admitting: Orthopedic Surgery

## 2022-03-22 ENCOUNTER — Encounter: Payer: Self-pay | Admitting: Adult Health

## 2022-03-22 DIAGNOSIS — K922 Gastrointestinal hemorrhage, unspecified: Secondary | ICD-10-CM | POA: Diagnosis not present

## 2022-03-22 DIAGNOSIS — E039 Hypothyroidism, unspecified: Secondary | ICD-10-CM

## 2022-03-22 DIAGNOSIS — J90 Pleural effusion, not elsewhere classified: Secondary | ICD-10-CM

## 2022-03-22 DIAGNOSIS — L97912 Non-pressure chronic ulcer of unspecified part of right lower leg with fat layer exposed: Secondary | ICD-10-CM

## 2022-03-22 DIAGNOSIS — L03115 Cellulitis of right lower limb: Secondary | ICD-10-CM | POA: Diagnosis not present

## 2022-03-22 DIAGNOSIS — D500 Iron deficiency anemia secondary to blood loss (chronic): Secondary | ICD-10-CM

## 2022-03-22 DIAGNOSIS — I480 Paroxysmal atrial fibrillation: Secondary | ICD-10-CM

## 2022-03-22 DIAGNOSIS — I739 Peripheral vascular disease, unspecified: Secondary | ICD-10-CM

## 2022-03-22 DIAGNOSIS — I10 Essential (primary) hypertension: Secondary | ICD-10-CM

## 2022-03-22 DIAGNOSIS — M159 Polyosteoarthritis, unspecified: Secondary | ICD-10-CM

## 2022-03-22 DIAGNOSIS — G894 Chronic pain syndrome: Secondary | ICD-10-CM

## 2022-03-22 NOTE — Telephone Encounter (Signed)
You saw this pt in the office this morning. S/p debridement and graft please see message below.  ?

## 2022-03-22 NOTE — Telephone Encounter (Signed)
Pt daughter is calling and has questions about the wound vac and getting her in with hospice care.  ? ? ?CB [phone] ?

## 2022-03-22 NOTE — Progress Notes (Signed)
? ?Location:  [facility] ?Nursing Home Room Number: 206-B ?Place of Service:  SNF (31) ?Provider:  Kenard GowerMedina-Vargas, Jakaria Lavergne, DNP, FNP-BC ? ?Patient Care Team: ?Rubye BeachHedgecock, Suzanne, PA-C as PCP - General (Physician Assistant) ?Iran OuchArida, Muhammad A, MD as PCP - Cardiology (Cardiology) ?Hedgecock, Orlene PlumSuzanne, PA-C as Advice workerhysician Assistant (Physician Geophysicist/field seismologistAssistant) ? ?Extended Emergency Contact Information ?Primary Emergency Contact: Lane,Mark ?Work Phone: [phone] ?Mobile Phone: [phone] ?Relation: Son ?Secondary Emergency Contact: Lee,Deanna ?Mobile Phone: [phone] ?Relation: Neighbor ?Interpreter needed? No ? ?Code Status:  Full Code ? ?Goals of care: Advanced Directive information ? ?  03/22/2022  ?  2:54 PM  ?Advanced Directives  ?Does Patient Have a Medical Advance Directive? Yes  ?Type of Advance Directive Out of facility DNR (pink MOST or yellow form)  ?Does patient want to make changes to medical advance directive? No - Patient declined  ?Pre-existing out of facility DNR order (yellow form or pink MOST form) Pink MOST form placed in chart (order not valid for inpatient use)  ? ? ? ?Chief Complaint  ?Patient presents with  ? Acute Visit  ?  Discharge home on 03/23/22 with hospice  ? ? ?HPI:  ?Pt is a 81 y.o. female who is for discharge home on 03/23/22 with Hospice RN to assess and treat. ? ?She was [facility] on 03/12/22 post hospitalization 02/06/22 to 02/22/22.  She had a fall at home and presented to the hospital with large ecchymoses on the medial aspect of her right lower extremity causing significant pain.  RLE US showed extensive subcutaneous edema with a definite fluid collection and Doppler was negative for DVT.  She was a started on IV Vanco and Rocephin initially.  On 3/15, she underwent excisional debridement of right lower leg with Ortho and was found to have massive necrotic soft tissue.  Wound VAC was placed and continued to have serosanguineous output.  Cultures  grew E faecalis initially and Rocephin was discontinued.  Later throughout the hospitalization, cultures started to grow bacteroids and Flagyl was added to her antibiotic regimen.  Additionally, she had an aortogram with vascular surgery given that ABIs were performed and showed noncompressible ABI on the right and left 1.35.  Bilateral TBI are abnormal with readings of 0.37 on the right and 0.54 on the left.  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  On 3/22, she had an excisional debridement of skin and soft tissue and had a split thickness skin graft placed.  Vanco and Flagyl were continued. ?She completed course of Vancomycin and Flagyl. She initially presented with atrial fibrillation and converted to NSR. She was started on Amiodarone 200 mg BID. Her hemoglobin dropped during hospitalization and was thought to be due to GI bleed but she declined endoscopy. She was started on Protonix 40 mg BID X 30 days then 40 mg daily. She received IV Ferrlicit 750 mg. ? ?[facility], wound vac accidentally came off so she was sent to ED on 02/25/22. Wound bandage was replaced and was [facility]. ?  ?On 4/14, she had right leg irrigation and debridement extremity, skin graft full-thickness, application of wound VAC and [facility] was complicated by mild pneumonia which was treated with Cefdinir. ? ?On 4/24, wound vac was removed by Dr. Lajoyce Cornersuda and [facility]. ? ?Patient was admitted to this facility for short-term rehabilitation after  the patient's recent hospitalization.  Patient has completed SNF rehabilitation and therapy has cleared the patient for discharge. ? ? ? ?Past Surgical History:  ?Procedure Laterality Date  ? ABDOMINAL AORTOGRAM W/LOWER EXTREMITY N/A 02/15/2022  ? Procedure: ABDOMINAL AORTOGRAM W/LOWER EXTREMITY;  Surgeon:  Maeola Harman, MD;  Location: [facility] INVASIVE CV LAB;  Service: Cardiovascular;  Laterality: N/A;  ? ABDOMINAL HYSTERECTOMY    ? ANTERIOR FUSION CERVICAL SPINE    ? x2 -C4-7  ? APPLICATION OF WOUND VAC Right 03/12/2022  ? Procedure: APPLICATION OF WOUND VAC;  Surgeon: Nadara Mustard, MD;  Location: [facility] OR;  Service: Orthopedics;  Laterality: Right;  ? BUNIONECTOMY Bilateral   ? COLONOSCOPY    ? CYSTOSCOPY W/ URETEROSCOPY  2012  ? EYE SURGERY Bilateral   ? cataract /lens implant  ? HOLMIUM LASER APPLICATION Left 02/08/2013  ? Procedure: HOLMIUM LASER APPLICATION;  Surgeon: Anner Crete, MD;  Location: [facility];  Service: Urology;  Laterality: Left;  ? I & D EXTREMITY Right 02/10/2022  ? Procedure: DEBRIDEMENT RIGHT LEG ABSCESS;  Surgeon: Nadara Mustard, MD;  Location: [facility] OR;  Service: Orthopedics;  Laterality: Right;  ? I & D EXTREMITY Right 03/12/2022  ? Procedure: RIGHT LEG IRRIGATION AND DEBRIDEMENT EXTREMITY;  Surgeon: Nadara Mustard, MD;  Location: [facility] OR;  Service: Orthopedics;  Laterality: Right;  ? INSERTION OF MESH N/A 07/15/2014  ? Procedure: INSERTION OF MESH;  Surgeon: Ardeth Sportsman, MD;  Location: [facility] OR;  Service: General;  Laterality: N/A;  ? JOINT REPLACEMENT Right 2012  ? shoulder  ? LAPAROSCOPIC CHOLECYSTECTOMY W/ CHOLANGIOGRAPHY  2012  ? Dr Magnus Ivan  ? NASAL SINUS SURGERY    ? OPEN REDUCTION INTERNAL FIXATION (ORIF) DISTAL RADIAL FRACTURE Left 03/20/2021  ? Procedure: OPEN REDUCTION INTERNAL FIXATION (ORIF) DISTAL RADIAL FRACTURE;  Surgeon: Teryl Lucy, MD;  Location: [facility] OR;  Service: Orthopedics;  Laterality: Left;  ? RADIOLOGY WITH ANESTHESIA N/A 05/09/2014  ? Procedure: ADULT SEDATION WITH ANESTHESIA/MRI CERVICAL SPINE WITHOUT CONTRAST;  Surgeon: Medication Radiologist, MD;  Location: [facility] OR;  Service: Radiology;  Laterality: N/A;  DR. HAWKS/MRI  ? right knee arthroscopy    ? d/t meniscal tear  ? SHOULDER ARTHROSCOPY W/ ROTATOR CUFF REPAIR Bilateral three times each over  several yrs  ? SKIN FULL THICKNESS GRAFT Right 03/12/2022  ? Procedure: SKIN GRAFT FULL THICKNESS;  Surgeon: Nadara Mustard, MD;  Location: [facility] OR;  Service: Orthopedics;  Laterality: Right;  ? SKIN SPLIT GRAFT Right 02/17/2022  ? Procedure: RIGHT LEG SKIN GRAFT;  Surgeon: Nadara Mustard, MD;  Location: [facility] OR;  Service: Orthopedics;  Laterality: Right;  ? THUMB ARTHROSCOPY Left   ? 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 Procedure: RIGHT TOTAL KNEE ARTHROPLASTY;  Surgeon: Jodi Geralds, MD;  Location: [facility] OR;  Service: Orthopedics;  Laterality: Right;  ? UMBILICAL HERNIA REPAIR N/A 07/15/2014  ? Procedure: LAPAROSCOPIC UMBILICAL AND INFRAUMBILICAL HERNIA;  Surgeon: Ardeth Sportsman, MD;  Location: [facility] OR;  Service: General;  Laterality: N/A;  ? ? ?Allergies  ?Allergen Reactions  ? Dilaudid [Hydromorphone Hcl] Shortness Of Breath  ? Gabapentin Other (See Comments)  ?  Hoarseness , headache and sore throat  ? Latex Rash  ?  Severe rash  ? Lyrica [Pregabalin] Other (See Comments)  ?  No balance , had to walk with cane , Blurred vision,weakness.  ? Oxycodone Shortness Of Breath and Other (See Comments)  ?  Tolerated oxycodone during admission to hospital 02/06/22  ?  Singulair [Montelukast] Shortness Of Breath and Other (See Comments)  ?  Vision issues, also  ? Ciprofloxacin Hcl Nausea And Vomiting and Other (See Comments)  ?  Nausea and vomiting with by mouth form  ? Codeine Other (See Comments)  ?  Hallucinations  ? Methadone Nausea And Vomiting and Other (See Comments)  ?  Severe nausea and vomiting  ? Metronidazole Nausea And Vomiting and Other (See Comments)  ?  Gastric pain  ? Oysters [Shellfish Allergy] Other (See Comments)  ?  "Terrible gastric upset and cramping."  ? Clindamycin/Lincomycin Diarrhea and Nausea Only  ? Donepezil Diarrhea and Other (See Comments)  ?  Severe diarrhea  ? Sulfa Antibiotics Diarrhea and Other (See Comments)  ?  GI issues ?  ? Tape Other (See Comments)  ?  ADHESIVE TAPE-Severe rash  ? Zetia  [Ezetimibe] Diarrhea  ? Elemental Sulfur Nausea And Vomiting  ? Iodine Rash  ? Other Rash  ?  All Antibiotic ointments/ creams  ? Oyster Shell Rash  ? Penicillin G Nausea And Vomiting and Rash  ? Penicillin

## 2022-03-23 ENCOUNTER — Ambulatory Visit: Payer: Medicare Other | Admitting: Cardiovascular Disease

## 2022-03-23 MED ORDER — LOSARTAN POTASSIUM 25 MG PO TABS: 25.0000 mg | ORAL_TABLET | Freq: Every day | ORAL | 0 refills | Status: DC

## 2022-03-23 MED ORDER — RESTASIS 0.05 % OP EMUL: 1.0000 [drp] | Freq: Every day | OPHTHALMIC | 0 refills | Status: AC

## 2022-03-23 MED ORDER — LORATADINE 10 MG PO TABS: 10.0000 mg | ORAL_TABLET | Freq: Every day | ORAL | 0 refills | Status: DC

## 2022-03-23 MED ORDER — LEVOTHYROXINE SODIUM 50 MCG PO TABS: 50.0000 ug | ORAL_TABLET | Freq: Every morning | ORAL | 0 refills | Status: DC

## 2022-03-23 MED ORDER — ONDANSETRON HCL 8 MG PO TABS: 8.0000 mg | ORAL_TABLET | Freq: Three times a day (TID) | ORAL | 0 refills | Status: DC | PRN

## 2022-03-23 MED ORDER — TRIAMCINOLONE ACETONIDE 55 MCG/ACT NA AERO: 1.0000 | INHALATION_SPRAY | Freq: Two times a day (BID) | NASAL | 3 refills | Status: DC | PRN

## 2022-03-23 MED ORDER — FERROUS SULFATE 325 (65 FE) MG PO TABS: 325.0000 mg | ORAL_TABLET | Freq: Every day | ORAL | 0 refills | Status: DC

## 2022-03-23 MED ORDER — ROSUVASTATIN CALCIUM 5 MG PO TABS: 5.0000 mg | ORAL_TABLET | Freq: Every day | ORAL | 0 refills | Status: DC

## 2022-03-23 MED ORDER — MAGNESIUM CITRATE 200 MG PO TABS: 0.5000 | ORAL_TABLET | Freq: Every day | ORAL | 0 refills | Status: DC

## 2022-03-23 MED ORDER — ENZYME DIGEST PO CAPS
1.0000 | ORAL_CAPSULE | Freq: Every day | ORAL | 0 refills | Status: DC | PRN
Start: 2022-03-23 — End: 2024-10-17

## 2022-03-23 MED ORDER — PANTOPRAZOLE SODIUM 40 MG PO TBEC: 40.0000 mg | DELAYED_RELEASE_TABLET | Freq: Every day | ORAL | 0 refills | Status: DC

## 2022-03-23 MED ORDER — OXYCODONE HCL 5 MG PO TABS: 5.0000 mg | ORAL_TABLET | Freq: Four times a day (QID) | ORAL | 0 refills | Status: DC | PRN

## 2022-03-23 MED ORDER — POTASSIUM GLUCONATE ER 595 MG PO TBCR: 595.0000 mg | EXTENDED_RELEASE_TABLET | Freq: Every day | ORAL | 0 refills | Status: DC

## 2022-03-23 MED ORDER — AMIODARONE HCL 200 MG PO TABS: 200.0000 mg | ORAL_TABLET | Freq: Two times a day (BID) | ORAL | 0 refills | Status: DC

## 2022-03-23 MED ORDER — ASCORBIC ACID 500 MG PO TABS: 500.0000 mg | ORAL_TABLET | Freq: Every day | ORAL | 0 refills | Status: AC

## 2022-03-23 MED ORDER — POLYETHYLENE GLYCOL 3350 17 G PO PACK: 17.0000 g | PACK | Freq: Every day | ORAL | 2 refills | Status: DC

## 2022-03-23 MED ORDER — ASPIRIN EC 81 MG PO TBEC: 81.0000 mg | DELAYED_RELEASE_TABLET | Freq: Every day | ORAL | 0 refills | Status: DC

## 2022-03-23 MED ORDER — FUROSEMIDE 20 MG PO TABS: 20.0000 mg | ORAL_TABLET | Freq: Every day | ORAL | 0 refills | Status: DC | PRN

## 2022-03-23 MED ORDER — PREDNISONE 5 MG PO TABS: 5.0000 mg | ORAL_TABLET | Freq: Every day | ORAL | 0 refills | Status: DC

## 2022-03-23 MED ORDER — ALBUTEROL SULFATE HFA 108 (90 BASE) MCG/ACT IN AERS: 2.0000 | INHALATION_SPRAY | RESPIRATORY_TRACT | 0 refills | Status: DC | PRN

## 2022-03-23 MED ORDER — DOCUSATE SODIUM 100 MG PO CAPS: 100.0000 mg | ORAL_CAPSULE | Freq: Two times a day (BID) | ORAL | 0 refills | Status: DC

## 2022-03-23 NOTE — Progress Notes (Deleted)
Cardiology Office Note   Date:  03/23/2022   ID:  Loralie, Schlereth 1941/11/29, MRN [id number]  PCP:  Jamal Collin, PA-C  Cardiologist:   Lorine Bears, MD   No chief complaint on file.     History of Present Illness: Isabel Barnes is a 81 y.o. 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  In addition, she has mild aortic stenosis.   She has known history of hypertension controlled with losartan.  In addition, she has extensive arthritis, spondylosis and chronic pain.  She is a previous smoker and has no history of diabetes.   She is known to have severe chronic venous insufficiency with secondary lymphedema especially on the right side with associated superficial wounds.  Previous lower extremity venous Doppler showed no evidence of DVT.  Echocardiogram in 2020 showed normal LV systolic function with grade 2 diastolic dysfunction, mild pulmonary hypertension and mild aortic stenosis.  The patient has not been seen by me since 2021.  An ABI was done in March which showed noncompressible vessels with abnormal toe pressure.  She underwent lower extremity angiography by Dr. Randie Heinz in March of this year which showed no significant iliac, SFA or popliteal disease.  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HCC)    Unsteady gait    occasionally   Urinary urgency     Past Surgical History:  Procedure Laterality Date   ABDOMINAL AORTOGRAM W/LOWER EXTREMITY N/A 02/15/2022   Procedure: ABDOMINAL AORTOGRAM W/LOWER EXTREMITY;  Surgeon: Maeola Harman, MD;  Location: [facility] INVASIVE CV LAB;  Service: Cardiovascular;  Laterality: N/A;   ABDOMINAL HYSTERECTOMY     ANTERIOR FUSION CERVICAL SPINE     x2 -C4-7   APPLICATION OF WOUND VAC Right 03/12/2022   Procedure: APPLICATION OF WOUND VAC;  Surgeon: Nadara Mustard, MD;  Location: [facility] OR;  Service: Orthopedics;  Laterality: Right;   BUNIONECTOMY Bilateral    COLONOSCOPY     CYSTOSCOPY  W/ URETEROSCOPY  2012   EYE SURGERY Bilateral    cataract /lens implant   HOLMIUM LASER APPLICATION Left [id number]   Procedure: HOLMIUM LASER APPLICATION;  Surgeon: Malka So, MD;  Location: [facility];  Service: Urology;  Laterality: Left;   I & D EXTREMITY Right 02/10/2022   Procedure: DEBRIDEMENT RIGHT LEG ABSCESS;  Surgeon: Newt Minion, MD;  Location: [place];  Service: Orthopedics;  Laterality: Right;   I & D EXTREMITY Right 03/12/2022   Procedure: RIGHT LEG IRRIGATION AND DEBRIDEMENT EXTREMITY;  Surgeon: Newt Minion, MD;  Location: [place];  Service: Orthopedics;  Laterality: Right;   INSERTION OF MESH N/A 07/15/2014   Procedure: INSERTION OF MESH;   Surgeon: Adin Hector, MD;  Location: [place];  Service: General;  Laterality: N/A;   JOINT REPLACEMENT Right 2012   shoulder   LAPAROSCOPIC CHOLECYSTECTOMY W/ CHOLANGIOGRAPHY  2012   Dr Ninfa Linden   NASAL SINUS SURGERY     OPEN REDUCTION INTERNAL FIXATION (ORIF) DISTAL RADIAL FRACTURE Left 03/20/2021   Procedure: OPEN REDUCTION INTERNAL FIXATION (ORIF) DISTAL RADIAL FRACTURE;  Surgeon: Marchia Bond, MD;  Location: [place];  Service: Orthopedics;  Laterality: Left;   RADIOLOGY WITH ANESTHESIA N/A 05/09/2014   Procedure: ADULT SEDATION WITH ANESTHESIA/MRI CERVICAL SPINE WITHOUT CONTRAST;  Surgeon: Medication Radiologist, MD;  Location: [place];  Service: Radiology;  Laterality: N/A;  DR. HAWKS/MRI   right knee arthroscopy     d/t meniscal tear   SHOULDER ARTHROSCOPY W/ ROTATOR CUFF REPAIR Bilateral three times each over several yrs   SKIN FULL THICKNESS GRAFT Right 03/12/2022   Procedure: SKIN GRAFT FULL THICKNESS;  Surgeon: Newt Minion, MD;  Location: [place];  Service: Orthopedics;  Laterality: Right;   SKIN SPLIT GRAFT Right 02/17/2022   Procedure: RIGHT LEG SKIN GRAFT;  Surgeon: Newt Minion, MD;  Location: [place];  Service: Orthopedics;  Laterality: Right;   THUMB ARTHROSCOPY Left    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   Procedure: RIGHT TOTAL KNEE ARTHROPLASTY;  Surgeon: Dorna Leitz, MD;  Location: [place];  Service: Orthopedics;  Laterality: Right;   UMBILICAL HERNIA REPAIR N/A 07/15/2014   Procedure: LAPAROSCOPIC UMBILICAL AND INFRAUMBILICAL HERNIA;  Surgeon: Adin Hector, MD;  Location: [place];  Service: General;  Laterality: N/A;     Current Outpatient Medications  Medication Sig Dispense Refill   acetaminophen (TYLENOL) 500 MG tablet Take 2 tablets (1,000 mg total) by mouth 3 (three) times daily. 180 tablet 0   albuterol (VENTOLIN HFA) 108 (90 Base) MCG/ACT inhaler Inhale 2 puffs into the lungs every 4 (four) hours as needed for shortness of breath.      amiodarone (PACERONE) 200  MG tablet Take 1 tablet (200 mg total) by mouth 2 (two) times daily. 60 tablet    ascorbic acid (VITAMIN C) 500 MG tablet Take 1 tablet (500 mg total) by mouth daily. 30 tablet 0   aspirin EC 81 MG tablet Take 81 mg by mouth daily.     bisacodyl (DULCOLAX) 10 MG suppository Place 10 mg rectally daily as needed (if no relief from milk of mag).     carboxymethylcellulose (REFRESH PLUS) 0.5 % SOLN Place 1 drop into both eyes in the morning, at noon, and at bedtime.     cefdinir (OMNICEF) 300 MG capsule Take 1 capsule (300 mg total) by mouth 2 (two) times daily. 7 capsule 0   cholecalciferol (VITAMIN D) 1000 UNITS tablet Take 1,000 Units by mouth  2 (two) times daily.     diclofenac sodium (VOLTAREN) 1 % GEL Apply 2 g topically 2 (two) times daily as needed (for pain).     Digestive Enzymes (ENZYME DIGEST PO) Take 1 tablet by mouth daily as needed (For digestive).     docusate sodium (COLACE) 100 MG capsule Take 1 capsule (100 mg total) by mouth 2 (two) times daily. 10 capsule 0   ENSURE PLUS (ENSURE PLUS) LIQD Take 237 mLs by mouth daily as needed (for supplementation).     ferrous sulfate 325 (65 FE) MG tablet Take 325 mg by mouth daily with breakfast.     fish oil-omega-3 fatty acids 1000 MG capsule Take 1,000 mg by mouth 2 (two) times daily.      furosemide (LASIX) 20 MG tablet Take 20 mg by mouth daily.     Lactobacillus (PROBIOTIC ACIDOPHILUS PO) Take 1 capsule by mouth daily.     levothyroxine (SYNTHROID, LEVOTHROID) 50 MCG tablet Take 50 mcg by mouth every morning.     loperamide (IMODIUM A-D) 2 MG tablet Take 4 mg by mouth 3 (three) times daily as needed for diarrhea or loose stools.     loratadine (CLARITIN) 10 MG tablet Take 10 mg by mouth daily.     losartan (COZAAR) 25 MG tablet Take 25 mg by mouth daily.     Magnesium Citrate 200 MG TABS Take 0.5 tablets by mouth daily.     magnesium hydroxide (MILK OF MAGNESIA) 400 MG/5ML suspension Take 30 mLs by mouth daily as needed for mild  constipation.     melatonin 5 MG TABS Take 5 mg by mouth every evening.     Multiple Vitamin (DAILY VITE PO) TAKE 1 TABLET BY MOUTH ONCE DAILY FOR SUPPLEMENT     Multiple Vitamins-Minerals (MULTIVITAMIN PO) Take 1 tablet by mouth daily.     ondansetron (ZOFRAN) 8 MG tablet Take 1 tablet (8 mg total) by mouth every 8 (eight) hours as needed for nausea or vomiting. 20 tablet 0   oxyCODONE (OXY IR/ROXICODONE) 5 MG immediate release tablet Take 1 tablet (5 mg total) by mouth every 6 (six) hours as needed for severe pain. 60 tablet 0   pantoprazole (PROTONIX) 40 MG tablet Take 1 tablet (40 mg total) by mouth daily. 30 tablet 0   polyethylene glycol (MIRALAX / GLYCOLAX) 17 g packet Take 17 g by mouth daily as needed for moderate constipation or mild constipation. 14 each 0   Potassium Gluconate 595 MG TBCR Take 595 mg by mouth daily.     predniSONE (DELTASONE) 5 MG tablet Take 5 mg by mouth daily with breakfast.     RESTASIS 0.05 % ophthalmic emulsion Place 1 drop into both eyes daily.     rosuvastatin (CRESTOR) 5 MG tablet Take 5 mg by mouth at bedtime.     Sodium Phosphates (RA SALINE ENEMA RE) Place 1 enema rectally daily as needed (if no relief  from milk of mag or bisacodyl).     triamcinolone (NASACORT) 55 MCG/ACT AERO nasal inhaler Place 1-2 sprays into the nose 2 (two) times daily as needed (for seasonal allergies).     Turmeric 500 MG CAPS Take 1 capsule by mouth at bedtime.     vitamin B-12 (CYANOCOBALAMIN) 250 MCG tablet Take 250 mcg by mouth at bedtime.      No current facility-administered medications for this visit.    Allergies:   Dilaudid [hydromorphone hcl]; Gabapentin; Latex; Lyrica [pregabalin]; Oxycodone; Singulair [montelukast]; Ciprofloxacin hcl; Codeine; Methadone;  Metronidazole; Oysters [shellfish allergy]; Clindamycin/lincomycin; Donepezil; Sulfa antibiotics; Tape; Zetia [ezetimibe]; Elemental sulfur; Iodine; Other; Oyster shell; Penicillin g; Penicillins; Povidone iodine;  Skintegrity hydrogel [skin protectants, misc.]; and Tapentadol    Social History:  The patient  reports that she quit smoking about 30 years ago. Her smoking use included cigarettes. She has a 1.00 pack-year smoking history. She has never used smokeless tobacco. She reports that she does not drink alcohol and does not use drugs.   Family History:  The patient's family history includes Stroke in her father.    ROS:  Please see the history of present illness.   Otherwise, review of systems are positive for none.   All other systems are reviewed and negative.    PHYSICAL EXAM: VS:  There were no vitals taken for this visit. , BMI There is no height or weight on file to calculate BMI. GEN: Well nourished, well developed, in no acute distress  HEENT: normal  Neck: no JVD,  or masses.  Bilateral carotid bruits. Cardiac: RRR; no  rubs, or gallops.  2/ 6 crescendo decrescendo systolic murmur in the aortic area which is early peaking. there is moderate to severe bilateral leg edema with stasis dermatitis worse on the right side.  Respiratory:  clear to auscultation bilaterally, normal work of breathing GI: soft, nontender, nondistended, + BS MS: no deformity or atrophy  Skin: warm and dry, no rash Neuro:  Strength and sensation are intact Psych: euthymic mood, full affect Vascular: Difficult to palpate distal pulses could be due to edema.  Previously, her posterior tibial pulse was palpable. Small superficial ulceration on the right heel.  EKG:  EKG is not ordered today.    Recent Labs: 02/06/2022: TSH 4.449 02/09/2022: ALT 20 02/12/2022: Magnesium 2.0 03/16/2022: BUN 9; Creatinine 0.6; Hemoglobin 8.6; Platelets 272; Potassium 4.3; Sodium 133    Lipid Panel    Component Value Date/Time   CHOL 125 02/13/2022 0258   TRIG 84 02/13/2022 0258   HDL 34 (L) 02/13/2022 0258   CHOLHDL 3.7 02/13/2022 0258   VLDL 17 02/13/2022 0258   LDLCALC 74 02/13/2022 0258      Wt Readings from Last 3  Encounters:  03/22/22 129 lb 6.4 oz (58.7 kg)  03/15/22 129 lb 6.4 oz (58.7 kg)  03/12/22 125 lb (56.7 kg)            View : No data to display.            ASSESSMENT AND PLAN:   1.  Chronic venous insufficiency with secondary lymphedema and recurrent venous wounds: Continue with support stockings, leg elevation and lymphedema pump.    2.  Slow healing ulceration on the right heel: We should check her arterial circulation and thus I requested lower extremity arterial Doppler.  3.  Bilateral carotid bruits: I requested carotid Doppler.  4. Aortic stenosis: This was mild on echocardiogram from June 2020.  Recommend repeat echocardiogram in June 2022  5. Essential hypertension: Blood pressure is controlled.  Continue losartan.  4. Hyperlipidemia: She reports intolerance to Zetia due to GI symptoms.  She has not been on a statin and given suspected carotid disease, should consider treatment with atorvastatin or rosuvastatin.    Disposition:   FU with me in 6 months  Signed,  Kathlyn Sacramento, MD  03/23/2022 10:26 AM    [place]

## 2022-03-24 ENCOUNTER — Telehealth: Payer: Self-pay | Admitting: *Deleted

## 2022-03-24 NOTE — Telephone Encounter (Signed)
Pt daughter called stating pt is unable to change her bandage on wound and needs to be changed daily. Would like a call back [phone] ?

## 2022-03-24 NOTE — Telephone Encounter (Signed)
SW Jacki Cones, told her that we can get her mom set up with [facility] nursing. Not sure if they will be able to come out daily to do this, but we will try.. Per Dr.duda she will need silvadene on 4x4 gauze, ABD pads and wrap in ACE bandage. Will fax referral. ?

## 2022-03-25 NOTE — Telephone Encounter (Signed)
Pt daughter called and states that she needs someone to go out there and change/ clean pts wounds because she does not live in the [place] so there is no way possible for her to do it.  ? ?CB [phone]  ?

## 2022-03-26 ENCOUNTER — Telehealth: Payer: Self-pay | Admitting: Orthopedic Surgery

## 2022-03-26 NOTE — Telephone Encounter (Signed)
Pt daughter has called again. I transferred call to triage.  ? ? ?

## 2022-03-26 NOTE — Telephone Encounter (Signed)
I called and sw pt's daughter and she advised that she has care givers set up in the home to take care of her mother but no one will wash the wound or change the dressing. They do not feel they can and do not want to do it. I advised that they could accompany pt to her appt and we could teach a dry dressing in the office and pts daughter said no one will do this. Advised that a [facility]HA will not come out daily and will not come out for a dry dressing application. It is not a skilled need and this is governed by insurance. Pt's daughter asked what pt's are supposed to do when they have surgery if no one will care for them. I advised that this is a teachable skill and that family and friends are usually the ones to care for the pt unless she was in a skilled nursing facility in which this would be done for her. Pt has an appt on 03/31/2022 will discuss other options after eval.  ?

## 2022-03-26 NOTE — Telephone Encounter (Signed)
Pts daughter is calling they cant find anyone to get the home health care --bandage has not been changed  ? ?Please call Lawson Fiscal [phone] ?

## 2022-03-30 ENCOUNTER — Telehealth: Payer: Self-pay | Admitting: Orthopedic Surgery

## 2022-03-30 ENCOUNTER — Encounter: Payer: Self-pay | Admitting: Cardiovascular Disease

## 2022-03-30 ENCOUNTER — Encounter: Payer: Self-pay | Admitting: Orthopedic Surgery

## 2022-03-30 ENCOUNTER — Ambulatory Visit (INDEPENDENT_AMBULATORY_CARE_PROVIDER_SITE_OTHER): Payer: Medicare Other | Admitting: Orthopedic Surgery

## 2022-03-30 DIAGNOSIS — L97912 Non-pressure chronic ulcer of unspecified part of right lower leg with fat layer exposed: Secondary | ICD-10-CM

## 2022-03-30 NOTE — Telephone Encounter (Signed)
[employer] called with prior auth for home health. It can be accessed through the portal or call [phone] ?

## 2022-03-30 NOTE — Telephone Encounter (Signed)
Daughter called while I was in the room with the pt for her appt. She states that he mother qualifies for 35 hours of skilled care per week per her insurance company and she has not received anything. I advised that we have spoken at length about her dressing changes and that insurance will not cover for a dry dressing change. ( Advised pt and support staff while her during the visit that I could arrange for deliverable wound car supplies so that they can change the dressing daily) the pt's daughter states that she wants her mother to receive physical therapy and is upset that this has not been ordered for her. I was not aware that she wanted to have this set up for her and advised that I would be happy to send in a referral for PT to eval and treat for strength training and do an in home assessment for safety. I advised that I would be happy to send in this referral for her mother but again I a was not aware that this was something that she wanted. I will hold this message pending advisement from the HHPT to see if they can accept this referral.  ?

## 2022-03-30 NOTE — Telephone Encounter (Signed)
Registered for a [facility] provider portal. A request has to process through [place] for this site to have access. I called the number provided and went to provider services. I spoke with a representative that advised she could not provide any details about the pt that this was provider services and that I should contact member services but I would not have access to that information unless the pt was with me to give consent. ?

## 2022-03-30 NOTE — Telephone Encounter (Signed)
Please send order for Home Health --[employer] hasnt received anything  ?

## 2022-03-30 NOTE — Telephone Encounter (Signed)
Wound orders are the same so faxed over request form to [facility]. Will hold pending advisement of both referrals.  ?

## 2022-03-30 NOTE — Telephone Encounter (Signed)
Order for PT eval and treat strength training and home safety eval sent to [facility]. Will hold pending advisement. Waiting for the home delivery supply depending on visit today to see if treatment order has changed.  ?

## 2022-03-30 NOTE — Progress Notes (Signed)
? ?Office Visit Note ?  ?Patient: Isabel Barnes           ?Date of Birth: 14-Mar-1941           ?MRN: [id number] ?Visit Date: 03/30/2022 ?             ?Requested by: Ardith Dark, PA-C ?[address] ?[address] ?[place],  [place] [address] ?PCP: Ardith Dark, PA-C ? ?Chief Complaint  ?Patient presents with  ? Right Leg - Routine Post Op  ?  02/17/22 RLE debridement with Kerecis graft   ? ? ? ? ?HPI: ?Patient is an 81 year old woman who presents in follow-up approximately 5 weeks status post debridement right leg with application of biologic tissue graft.  Patient has been using a nonstick dressing there is maceration the dressing is saturated. ? ?Assessment & Plan: ?Visit Diagnoses:  ?1. Chronic ulcer of right leg, with fat layer exposed ([place])   ? ? ?Plan: Discussed with the patient recommendation for protein supplements 2 times a day recommended elevating her foot above her heart recommended exercise and walking to stimulate the calf pump.  We will send in a prescription for home health therapy.  Dressing changes with Dial soap cleansing daily with 4 x 4 gauze ? ?Follow-Up Instructions: Return in about 2 weeks (around 04/13/2022).  ? ?Ortho Exam ? ?Patient is alert, oriented, no adenopathy, well-dressed, normal affect, normal respiratory effort. ?The breed the fibrinous exudative tissue the wound is macerated. ? ?Imaging: ?No results found. ? ? ? ?Labs: ?Lab Results  ?Component Value Date  ? ESRSEDRATE 40 (H) 02/07/2022  ? CRP 20.5 (H) 02/07/2022  ? REPTSTATUS 02/16/2022 FINAL 02/10/2022  ? GRAMSTAIN  02/10/2022  ?  RARE WBC PRESENT, PREDOMINANTLY PMN ?NO ORGANISMS SEEN ?  ? CULT  02/10/2022  ?  RARE ENTEROCOCCUS FAECALIS ?RARE BACTEROIDES SPECIES NOT FRAGILIS ?BETA LACTAMASE POSITIVE ?Performed at [facility], [place] [address], [place],  [address] ?  ? LABORGA ENTEROCOCCUS FAECALIS 02/10/2022  ? ? ? ?Lab Results  ?Component Value Date  ? ALBUMIN 1.5 (L) 02/09/2022  ? ALBUMIN 2.1 (L)  02/06/2022  ? ALBUMIN 3.7 06/25/2017  ? ? ?Lab Results  ?Component Value Date  ? MG 2.0 02/12/2022  ? MG 1.8 02/07/2022  ? ?Lab Results  ?Component Value Date  ? VD25OH 69.88 02/07/2022  ? ? ?No results found for: PREALBUMIN ? ?  Latest Ref Rng & Units 03/16/2022  ? 12:00 AM 03/04/2022  ? 12:00 AM 02/25/2022  ?  1:16 AM  ?CBC EXTENDED  ?WBC  6.9      5.7      10.0    ?RBC 3.87 - 5.11 2.96      3.43      2.98    ?Hemoglobin 12.0 - 16.0 8.6      9.8      8.2    ?HCT 36 - 46 26      29      26.7    ?Platelets 150 - 400 K/uL 272      420      375    ?NEUT#   3.70      6.3    ?Lymph# 0.7 - 4.0 K/uL   2.1    ?  ? This result is from an external source.  ? ? ? ?There is no height or weight on file to calculate BMI. ? ?Orders:  ?No orders of the defined types were placed in this encounter. ? ?  No orders of the defined types were placed in this encounter. ? ? ? Procedures: ?No procedures performed ? ?Clinical Data: ?No additional findings. ? ?ROS: ? ?All other systems negative, except as noted in the HPI. ?Review of Systems ? ?Objective: ?Vital Signs: There were no vitals taken for this visit. ? ?Specialty Comments:  ?No specialty comments available. ? ?PMFS History: ?Patient Active Problem List  ? Diagnosis Date Noted  ? Abscess of right lower leg   ? Severe protein-calorie malnutrition ([place])   ? PVD (peripheral vascular disease) ([place])   ? Fall   ? Atrial fibrillation ([place])   ? Goals of care, counseling/discussion   ? Anemia   ? Fluid collection (edema) in the arms, legs, hands and feet   ? Cellulitis of right lower extremity 02/06/2022  ? Chest pain, rule out acute myocardial infarction 03/16/2021  ? HTN (hypertension) 03/16/2021  ? Hypothyroidism 03/16/2021  ? HLD (hyperlipidemia) 03/16/2021  ? Aortic valve stenosis 03/16/2021  ? Chronic venous insufficiency of lower extremity 03/16/2021  ? Short-term memory loss 12/13/2019  ? 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 Primary osteoarthritis of right knee 07/16/2016  ?  Eosinophilia 06/16/2015  ? Chronic pain disorder   ? Osteoarthritis   ? Incisional umbilical hernia, without obstruction or gangrene   ? Iron deficiency anemia 09/19/2012  ? ?Past Medical History:  ?Diagnosis Date  ? Anemia   ? iron deficiency hx.has had iron infusions before   ? Chronic low back pain   ? Chronic pain disorder   ? Complication of anesthesia   ? severe claustrophobia  ? Constipation   ? r/t use of pain meds.Takes OTC meds or eats prunes  ? CVA (cerebral vascular accident) [facility])   ? 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 Depression   ? GERD (gastroesophageal reflux disease)   ? takes Omeprazole daily  ? Heart murmur   ? mild MS, moderate-severe AS 03/17/21 echo  ? History of bronchitis   ? 20+ yrs ago  ? History of kidney stones   ? 3 surgerical removed, 1 passed  ? History of prolapse of bladder   ? History of shingles   ? Hypertension   ? takes Losartan daily  ? Hypothyroidism   ? takes Synthroid daily  ? Joint swelling   ? Neck pain   ? bone spurs at base of head per pt  ? Osteoarthritis   ? lumbar,cervical,joints  ? Pneumonia   ? hx of > 20 yrs ago  ? Shortness of breath   ? occasionally and with exertion. Albuterol inhaler as needed  ? Spinal headache 1991  ? blood patch placed  ? Spondylitis ([place])   ? Unsteady gait   ? occasionally  ? Urinary urgency   ?  ?Family History  ?Problem Relation Age of Onset  ? Stroke Father   ?  ?Past Surgical History:  ?Procedure Laterality Date  ? ABDOMINAL AORTOGRAM W/LOWER EXTREMITY N/A 02/15/2022  ? Procedure: ABDOMINAL AORTOGRAM W/LOWER EXTREMITY;  Surgeon: Waynetta Sandy, MD;  Location: [facility];  Service: Cardiovascular;  Laterality: N/A;  ? ABDOMINAL HYSTERECTOMY    ? ANTERIOR FUSION CERVICAL SPINE    ? x2 -C4-7  ? APPLICATION OF WOUND VAC Right 03/12/2022  ? Procedure: APPLICATION OF WOUND VAC;  Surgeon: Newt Minion, MD;  Location: [facility];  Service: Orthopedics;  Laterality: Right;  ? BUNIONECTOMY Bilateral   ?  COLONOSCOPY    ? CYSTOSCOPY W/ URETEROSCOPY  2012  ? EYE SURGERY Bilateral   ? cataract /lens implant  ? HOLMIUM LASER APPLICATION Left [id number]  ? Procedure: HOLMIUM LASER APPLICATION;  Surgeon: Malka So, MD;  Location[facility];  Service: Urology;  Laterality: Left;  ? I & D EXTREMITY Right 02/10/2022  ? Procedure: DEBRIDEMENT RIGHT LEG ABSCESS;  Surgeon: Newt Minion, MD;  Location: [facility];  Service: Orthopedics;  Laterality: Right;  ? I & D EXTREMITY Right 03/12/2022  ? Procedure: RIGHT LEG IRRIGATION AND DEBRIDEMENT EXTREMITY;  Surgeon: Newt Minion, MD;  Location: [place];  Service: Orthopedics;  Laterality: Right;  ? INSERTION OF MESH N/A 07/15/2014  ? Procedure: INSERTION OF MESH;  Surgeon: Adin Hector, MD;  Location: [place];  Service: General;  Laterality: N/A;  ? JOINT REPLACEMENT Right 2012  ? shoulder  ? LAPAROSCOPIC CHOLECYSTECTOMY W/ CHOLANGIOGRAPHY  2012  ? Dr Ninfa Linden  ? NASAL SINUS SURGERY    ? OPEN REDUCTION INTERNAL FIXATION (ORIF) DISTAL RADIAL FRACTURE Left 03/20/2021  ? Procedure: OPEN REDUCTION INTERNAL FIXATION (ORIF) DISTAL RADIAL FRACTURE;  Surgeon: Marchia Bond, MD;  Location: [facility] [place];  Service: Orthopedics;  Laterality: Left;  ? RADIOLOGY WITH ANESTHESIA N/A 05/09/2014  ? Procedure: ADULT SEDATION WITH ANESTHESIA/MRI CERVICAL SPINE WITHOUT CONTRAST;  Surgeon: Medication Radiologist, MD;  Location: [place];  Service: Radiology;  Laterality: N/A;  DR. HAWKS/MRI  ? right knee arthroscopy    ? d/t meniscal tear  ? SHOULDER ARTHROSCOPY W/ ROTATOR CUFF REPAIR Bilateral three times each over several yrs  ? SKIN FULL THICKNESS GRAFT Right 03/12/2022  ? Procedure: SKIN GRAFT FULL THICKNESS;  Surgeon: Newt Minion, MD;  Location: [place];  Service: Orthopedics;  Laterality: Right;  ? SKIN SPLIT GRAFT Right 02/17/2022  ? Procedure: RIGHT LEG SKIN GRAFT;  Surgeon: Newt Minion, MD;  Location: [place];  Service: Orthopedics;  Laterality: Right;  ? THUMB ARTHROSCOPY Left   ? TOTAL  KNEE ARTHROPLASTY 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  ? Procedure: RIGHT TOTAL KNEE ARTHROPLASTY;  Surgeon: Dorna Leitz, MD;  Location: [facility];  Service: Orthopedics;  Laterality: Right;  ? UMBILICAL HERNIA REPAIR N/A 07/15/2014  ? Procedure: LAPAR

## 2022-03-30 NOTE — Telephone Encounter (Signed)
Huntley Dec from [facility] called and states that a prior auth form for Home health needs to be filled out. She states you can do this through [place] [facility] proivider portal or you can call at [phone] ?

## 2022-03-31 ENCOUNTER — Encounter: Payer: Medicare Other | Admitting: Family

## 2022-03-31 NOTE — Telephone Encounter (Signed)
Called and sw Sonia Side with [facility] and they are not able to staff the referral at this time. Physical therapy is scheduling about 4 weeks out as well as social work. I emailed Marisue [place] with [facility] and sent over pt demographic by email and through community message in [place] I asked for her to let me know asap if they are able to staff this referral. If they can not my next contact will be [facility].  ?

## 2022-04-04 ENCOUNTER — Encounter: Payer: Self-pay | Admitting: Orthopedic Surgery

## 2022-04-04 NOTE — Progress Notes (Signed)
? ?Office Visit Note ?  ?Patient: Isabel Barnes           ?Date of Birth: Apr 01, 1941           ?MRN: [id number] ?Visit Date: 03/22/2022 ?             ?Requested by: Ardith Dark, PA-C ?[address] ?[address] ?[place],  [place] [address] ?PCP: Ardith Dark, PA-C ? ?Chief Complaint  ?Patient presents with  ? Right Leg - Routine Post Op  ?  03/12/22 right leg I&D w/ full thickness skin graft  ? ? ? ? ?HPI: ?Patient is an 81 year old woman who presents 2 weeks status post debridement right leg with skin graft.  Patient states that her wound VAC has been off for several days. ? ?Assessment & Plan: ?Visit Diagnoses:  ?1. Chronic ulcer of right leg, with fat layer exposed ([place])   ? ? ?Plan: Start dry dressing changes daily with Dial soap cleansing. ? ?Follow-Up Instructions: Return in about 1 week (around 03/29/2022).  ? ?Ortho Exam ? ?Patient is alert, oriented, no adenopathy, well-dressed, normal affect, normal respiratory effort. ?Examination there is bleeding granulation tissue.  No cellulitis no signs of infection. ? ?Imaging: ?No results found. ?No images are attached to the encounter. ? ?Labs: ?Lab Results  ?Component Value Date  ? ESRSEDRATE 40 (H) 02/07/2022  ? CRP 20.5 (H) 02/07/2022  ? REPTSTATUS 02/16/2022 FINAL 02/10/2022  ? GRAMSTAIN  02/10/2022  ?  RARE WBC PRESENT, PREDOMINANTLY PMN ?NO ORGANISMS SEEN ?  ? CULT  02/10/2022  ?  RARE ENTEROCOCCUS FAECALIS ?RARE BACTEROIDES SPECIES NOT FRAGILIS ?BETA LACTAMASE POSITIVE ?Performed at [facility], [place] [address], [place], [place] [address] ?  ? LABORGA ENTEROCOCCUS FAECALIS 02/10/2022  ? ? ? ?Lab Results  ?Component Value Date  ? ALBUMIN 1.5 (L) 02/09/2022  ? ALBUMIN 2.1 (L) 02/06/2022  ? ALBUMIN 3.7 06/25/2017  ? ? ?Lab Results  ?Component Value Date  ? MG 2.0 02/12/2022  ? MG 1.8 02/07/2022  ? ?Lab Results  ?Component Value Date  ? VD25OH 69.88 02/07/2022  ? ? ?No results found for: PREALBUMIN ? ?  Latest Ref Rng & Units 03/16/2022  ? 12:00  AM 03/04/2022  ? 12:00 AM 02/25/2022  ?  1:16 AM  ?CBC EXTENDED  ?WBC  6.9      5.7      10.0    ?RBC 3.87 - 5.11 2.96      3.43      2.98    ?Hemoglobin 12.0 - 16.0 8.6      9.8      8.2    ?HCT 36 - 46 26      29      26.7    ?Platelets 150 - 400 K/uL 272      420      375    ?NEUT#   3.70      6.3    ?Lymph# 0.7 - 4.0 K/uL   2.1    ?  ? This result is from an external source.  ? ? ? ?There is no height or weight on file to calculate BMI. ? ?Orders:  ?No orders of the defined types were placed in this encounter. ? ?No orders of the defined types were placed in this encounter. ? ? ? Procedures: ?No procedures performed ? ?Clinical Data: ?No additional findings. ? ?ROS: ? ?All other systems negative, except as noted in the HPI. ?Review of Systems ? ?  Objective: ?Vital Signs: There were no vitals taken for this visit. ? ?Specialty Comments:  ?No specialty comments available. ? ?PMFS History: ?Patient Active Problem List  ? Diagnosis Date Noted  ? Abscess of right lower leg   ? Severe protein-calorie malnutrition ([place])   ? PVD (peripheral vascular disease) ([place])   ? Fall   ? Atrial fibrillation ([place])   ? Goals of care, counseling/discussion   ? Anemia   ? Fluid collection (edema) in the arms, legs, hands and feet   ? Cellulitis of right lower extremity 02/06/2022  ? Chest pain, rule out acute myocardial infarction 03/16/2021  ? HTN (hypertension) 03/16/2021  ? Hypothyroidism 03/16/2021  ? HLD (hyperlipidemia) 03/16/2021  ? Aortic valve stenosis 03/16/2021  ? Chronic venous insufficiency of lower extremity 03/16/2021  ? Short-term memory loss 12/13/2019  ? 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 Primary osteoarthritis of right knee 07/16/2016  ? Eosinophilia 06/16/2015  ? Chronic pain disorder   ? Osteoarthritis   ? Incisional umbilical hernia, without obstruction or gangrene   ? Iron deficiency anemia 09/19/2012  ? ?Past Medical History:  ?Diagnosis Date  ? Anemia   ? iron deficiency hx.has had iron infusions before   ?  Chronic low back pain   ? Chronic pain disorder   ? Complication of anesthesia   ? severe claustrophobia  ? Constipation   ? r/t use of pain meds.Takes OTC meds or eats prunes  ? CVA (cerebral vascular accident) [facility])   ? 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 Depression   ? GERD (gastroesophageal reflux disease)   ? takes Omeprazole daily  ? Heart murmur   ? mild MS, moderate-severe AS 03/17/21 echo  ? History of bronchitis   ? 20+ yrs ago  ? History of kidney stones   ? 3 surgerical removed, 1 passed  ? History of prolapse of bladder   ? History of shingles   ? Hypertension   ? takes Losartan daily  ? Hypothyroidism   ? takes Synthroid daily  ? Joint swelling   ? Neck pain   ? bone spurs at base of head per pt  ? Osteoarthritis   ? lumbar,cervical,joints  ? Pneumonia   ? hx of > 20 yrs ago  ? Shortness of breath   ? occasionally and with exertion. Albuterol inhaler as needed  ? Spinal headache 1991  ? blood patch placed  ? Spondylitis ([place])   ? Unsteady gait   ? occasionally  ? Urinary urgency   ?  ?Family History  ?Problem Relation Age of Onset  ? Stroke Father   ?  ?Past Surgical History:  ?Procedure Laterality Date  ? ABDOMINAL AORTOGRAM W/LOWER EXTREMITY N/A 02/15/2022  ? Procedure: ABDOMINAL AORTOGRAM W/LOWER EXTREMITY;  Surgeon: Waynetta Sandy, MD;  Location: [facility];  Service: Cardiovascular;  Laterality: N/A;  ? ABDOMINAL HYSTERECTOMY    ? ANTERIOR FUSION CERVICAL SPINE    ? x2 -C4-7  ? APPLICATION OF WOUND VAC Right 03/12/2022  ? Procedure: APPLICATION OF WOUND VAC;  Surgeon: Newt Minion, MD;  Location: [facility];  Service: Orthopedics;  Laterality: Right;  ? BUNIONECTOMY Bilateral   ? COLONOSCOPY    ? CYSTOSCOPY W/ URETEROSCOPY  2012  ? EYE SURGERY Bilateral   ? cataract /lens implant  ? HOLMIUM LASER APPLICATION Left [id number]  ? Procedure: HOLMIUM LASER APPLICATION;  Surgeon: Malka So, MD;  Location: [facility];  Service: Urology;  Laterality:  Left;  ? I & D EXTREMITY Right 02/10/2022  ? Procedure: DEBRIDEMENT RIGHT LEG ABSCESS;  Surgeon: Newt Minion, MD;  Location: [place] [facility];  Service: Orthopedics;  Laterality: Right;  ? I & D EXTREMITY Right 03/12/2022  ? Procedure: RIGHT LEG IRRIGATION AND DEBRIDEMENT EXTREMITY;  Surgeon: Newt Minion, MD;  Location: [facility];  Service: Orthopedics;  Laterality: Right;  ? INSERTION OF MESH N/A 07/15/2014  ? Procedure: INSERTION OF MESH;  Surgeon: Adin Hector, MD;  Location: [facility];  Service: General;  Laterality: N/A;  ? JOINT REPLACEMENT Right 2012  ? shoulder  ? LAPAROSCOPIC CHOLECYSTECTOMY W/ CHOLANGIOGRAPHY  2012  ? Dr Ninfa Linden  ? NASAL SINUS SURGERY    ? OPEN REDUCTION INTERNAL FIXATION (ORIF) DISTAL RADIAL FRACTURE Left 03/20/2021  ? Procedure: OPEN REDUCTION INTERNAL FIXATION (ORIF) DISTAL RADIAL FRACTURE;  Surgeon: Marchia Bond, MD;  Location: [facility];  Service: Orthopedics;  Laterality: Left;  ? RADIOLOGY WITH ANESTHESIA N/A 05/09/2014  ? Procedure: ADULT SEDATION WITH ANESTHESIA/MRI CERVICAL SPINE WITHOUT CONTRAST;  Surgeon: Medication Radiologist, MD;  Location: [facility];  Service: Radiology;  Laterality: N/A;  DR. HAWKS/MRI  ? right knee arthroscopy    ? d/t meniscal tear  ? SHOULDER ARTHROSCOPY W/ ROTATOR CUFF REPAIR Bilateral three times each over several yrs  ? SKIN FULL THICKNESS GRAFT Right 03/12/2022  ? Procedure: SKIN GRAFT FULL THICKNESS;  Surgeon: Newt Minion, MD;  Location: [facility];  Service: Orthopedics;  Laterality: Right;  ? SKIN SPLIT GRAFT Right 02/17/2022  ? Procedure: RIGHT LEG SKIN GRAFT;  Surgeon: Newt Minion, MD;  Location: [facility];  Service: Orthopedics;  Laterality: Right;  ? THUMB ARTHROSCOPY Left   ? 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 Procedure: RIGHT TOTAL KNEE ARTHROPLASTY;  Surgeon: Dorna Leitz, MD;  Location: [facility];  Service: Orthopedics;  Laterality: Right;  ? UMBILICAL HERNIA REPAIR N/A 07/15/2014  ? Procedure: LAPAROSCOPIC UMBILICAL AND INFRAUMBILICAL HERNIA;  Surgeon: Adin Hector, MD;  Location: [facility];  Service: General;  Laterality: N/A;  ? ?Social History  ? ?Occupational History  ? Not on file  ?Tobacco Use  ? Smoking status: Former  ?  Packs/day: 0.50  ?  Years: 2.0

## 2022-04-05 ENCOUNTER — Other Ambulatory Visit: Payer: Self-pay | Admitting: Adult Health

## 2022-04-05 ENCOUNTER — Other Ambulatory Visit: Payer: Self-pay | Admitting: Internal Medicine

## 2022-04-05 DIAGNOSIS — I480 Paroxysmal atrial fibrillation: Secondary | ICD-10-CM

## 2022-04-05 DIAGNOSIS — J90 Pleural effusion, not elsewhere classified: Secondary | ICD-10-CM

## 2022-04-05 NOTE — Telephone Encounter (Signed)
Email sent to [place] at [facility] with copy of demographic will hold pending advisement if they can accept this referral.  ?

## 2022-04-05 NOTE — Telephone Encounter (Signed)
This is the message I received from [facility]. This is the second home health agency that has declined the pt. I will hold and reach out to [facility] for services.  ? ? ?Isabel Barnes sent to [employer], Bunnie Pion, RMA ?Hi Isabel Barnes--  ?Sorry I meant to respond to you yesterday- and someone called and I never got back and forgot.  ?We are not able to help at this time.  ?Maybe  next week we will be able to but we do not have the availability right now.  ?Thanks for reaching out.  ?Isabel Barnes   ?  ?   ?Previous Messages ?  ?----- Message -----  ?From: Isabel Barnes, Isabel Barnes, RMA  ?Sent: 03/31/2022   9:23 AM EDT  ?To: Isabel Barnes  ?Subject: new pt referral                                ? ?I sent an email this morning about this pt. We would like to set her up for physical therapy eval and treat for strengthening and hoe assessment for safety. She really needs a Education officer, museum to help with community programs. She has [place] DOB 07/01/1941 can you please let me know asap if you can staff this referral.  ?  ?

## 2022-04-05 NOTE — Telephone Encounter (Signed)
Misty Stanley with [employer] emailed and let me know that they do not have the staff currently for physical therapy. I have tried three different home health agencies [facility], [facility]) and no one will accept this PT or social work referral. I will reach out to [facility] and see if they can staff this request.  ?

## 2022-04-06 NOTE — Telephone Encounter (Signed)
Tried to call pt to advise and the mailbox is full. Will hold and try again.  ?

## 2022-04-06 NOTE — Telephone Encounter (Signed)
This is the email I received from [facility] ? ?Hey Janeth Terry! Yes, I would love to start working with you all more often. [employer] is not a good insurance for any home health agency which is probably why you are having a difficult time finding staffing for this one. Unfortunately, we are not able to assist with this one at this time either. We only have a limited amount of [employer] spots each week and we have to reserve those spots for our top referring accounts. However, I would love to swing by to discuss how we can partner to benefit [place] both! Let me know when works for you. I look forward to hearing back from you soon!  ? ?Thanks!  ?Jana Half ?

## 2022-04-06 NOTE — Telephone Encounter (Signed)
I was able to call and sw the pt's care giver for an update to advise that I have tried 4 different [place] agencies and no one will accept this referral. I did get info from [facility] that said they may have availability in the next week or two so I will continue to communicate with that office to see what I can work out I did ask if they would be interested in going to out patient therapy and that we could arrange this if it was something that the pt wanted to do. Advised that if there are any questions to call me.  ?

## 2022-04-07 ENCOUNTER — Ambulatory Visit (INDEPENDENT_AMBULATORY_CARE_PROVIDER_SITE_OTHER): Payer: Medicare Other | Admitting: Family

## 2022-04-07 DIAGNOSIS — Z9889 Other specified postprocedural states: Secondary | ICD-10-CM

## 2022-04-07 DIAGNOSIS — L97912 Non-pressure chronic ulcer of unspecified part of right lower leg with fat layer exposed: Secondary | ICD-10-CM

## 2022-04-07 NOTE — Telephone Encounter (Signed)
Sent another message to [facility] to see if they could hang on to this referral and see if they may be able to staff it in the next several weeks.  ?

## 2022-04-07 NOTE — Telephone Encounter (Signed)
I spoke with Isabel Barnes with [facility] while the pt was in the office. Much like the other HHA they are limited on the number of [employer] pt's that they can accept from offices that do not normally refer to them. I explained the to the pt and advised that I would continue to work on getting approval for services. They will come in next week for appt with Dr. Lajoyce Corners  ?

## 2022-04-10 ENCOUNTER — Encounter: Payer: Self-pay | Admitting: Family

## 2022-04-10 NOTE — Progress Notes (Signed)
? ?Post-Op Visit Note ?  ?Patient: Isabel Barnes           ?Date of Birth: 09-13-1941           ?MRN: [id number] ?Visit Date: 04/07/2022 ?PCP: Jamal Collin, PA-C ? ?Chief Complaint:  ?Chief Complaint  ?Patient presents with  ? Right Leg - Routine Post Op  ?  02/17/22 RLE deb with Kerecis graft   ? ? ?HPI:  ?HPI ?The patient is an 81 year old woman who presents in follow-up status post Kerecis grafting following debridement on March 22 of the right lower extremity wound.  She has been using nonadherent dressings ? ?At last visit protein supplements were recommended twice daily. ?Ortho Exam ?On examination of the right lower extremity wound this is largely unchanged.  Healing seems to have stalled.  There is scant fibrinous exudative tissue in the wound bed.  There is no surrounding maceration today and there is 70% granulation there is no active bleeding no odor. no significant edema ? ?Visit Diagnoses: No diagnosis found. ? ?Plan: She will continue with daily dose of cleansing.  4 x 4 gauze dressing changes.  Ace for compression work on elevation.  Discussed possibility of an office Kerecis application at next visit ? ?Follow-Up Instructions: No follow-ups on file.  ? ?Imaging: ?No results found. ? ?Orders:  ?No orders of the defined types were placed in this encounter. ? ?No orders of the defined types were placed in this encounter. ? ? ? ?PMFS History: ?Patient Active Problem List  ? Diagnosis Date Noted  ? Abscess of right lower leg   ? Severe protein-calorie malnutrition (HCC)   ? PVD (peripheral vascular disease) (HCC)   ? Fall   ? Atrial fibrillation (HCC)   ? Goals of care, counseling/discussion   ? Anemia   ? Fluid collection (edema) in the arms, legs, hands and feet   ? Cellulitis of right lower extremity 02/06/2022  ? Chest pain, rule out acute myocardial infarction 03/16/2021  ? HTN (hypertension) 03/16/2021  ? Hypothyroidism 03/16/2021  ? HLD (hyperlipidemia) 03/16/2021  ? Aortic valve stenosis  03/16/2021  ? Chronic venous insufficiency of lower extremity 03/16/2021  ? Short-term memory loss 12/13/2019  ? 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  ? Primary osteoarthritis of right knee 07/16/2016  ? Eosinophilia 06/16/2015  ? Chronic pain disorder   ? Osteoarthritis   ? Incisional umbilical hernia, without obstruction or gangrene   ? Iron deficiency anemia 09/19/2012  ? ?Past Medical History:  ?Diagnosis Date  ? Anemia   ? iron deficiency hx.has had iron infusions before   ? Chronic low back pain   ? Chronic pain disorder   ? Complication of anesthesia   ? severe claustrophobia  ? Constipation   ? r/t use of pain meds.Takes OTC meds or eats prunes  ? CVA (cerebral vascular accident) [facility])   ? 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 Depression   ? GERD (gastroesophageal reflux disease)   ? takes Omeprazole daily  ? Heart murmur   ? mild MS, moderate-severe AS 03/17/21 echo  ? History of bronchitis   ? 20+ yrs ago  ? History of kidney stones   ? 3 surgerical removed, 1 passed  ? History of prolapse of bladder   ? History of shingles   ? Hypertension   ? takes Losartan daily  ? Hypothyroidism   ? takes Synthroid daily  ? Joint swelling   ? Neck pain   ? bone  spurs at base of head per pt  ? Osteoarthritis   ? lumbar,cervical,joints  ? Pneumonia   ? hx of > 20 yrs ago  ? Shortness of breath   ? occasionally and with exertion. Albuterol inhaler as needed  ? Spinal headache 1991  ? blood patch placed  ? 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 Unsteady gait   ? occasionally  ? Urinary urgency   ?  ?Family History  ?Problem Relation Age of Onset  ? Stroke Father   ?  ?Past Surgical History:  ?Procedure Laterality Date  ? ABDOMINAL AORTOGRAM W/LOWER EXTREMITY N/A 02/15/2022  ? Procedure: ABDOMINAL AORTOGRAM W/LOWER EXTREMITY;  Surgeon: Maeola Harman, MD;  Location: [facility] INVASIVE CV LAB;  Service: Cardiovascular;  Laterality: N/A;  ? ABDOMINAL HYSTERECTOMY    ? ANTERIOR FUSION CERVICAL SPINE    ? x2 -C4-7  ?  APPLICATION OF WOUND VAC Right 03/12/2022  ? Procedure: APPLICATION OF WOUND VAC;  Surgeon: Nadara Mustard, MD;  Location: [facility] OR;  Service: Orthopedics;  Laterality: Right;  ? BUNIONECTOMY Bilateral   ? COLONOSCOPY    ? CYSTOSCOPY W/ URETEROSCOPY  2012  ? EYE SURGERY Bilateral   ? cataract /lens implant  ? HOLMIUM LASER APPLICATION Left 02/08/2013  ? Procedure: HOLMIUM LASER APPLICATION;  Surgeon: Anner Crete, MD;  Location: [facility];  Service: Urology;  Laterality: Left;  ? I & D EXTREMITY Right 02/10/2022  ? Procedure: DEBRIDEMENT RIGHT LEG ABSCESS;  Surgeon: Nadara Mustard, MD;  Location: [facility] OR;  Service: Orthopedics;  Laterality: Right;  ? I & D EXTREMITY Right 03/12/2022  ? Procedure: RIGHT LEG IRRIGATION AND DEBRIDEMENT EXTREMITY;  Surgeon: Nadara Mustard, MD;  Location[facility];  Service: Orthopedics;  Laterality: Right;  ? INSERTION OF MESH N/A 07/15/2014  ? Procedure: INSERTION OF MESH;  Surgeon: Ardeth Sportsman, MD;  Location: [facility] OR;  Service: General;  Laterality: N/A;  ? JOINT REPLACEMENT Right 2012  ? shoulder  ? LAPAROSCOPIC CHOLECYSTECTOMY W/ CHOLANGIOGRAPHY  2012  ? Dr Magnus Ivan  ? NASAL SINUS SURGERY    ? OPEN REDUCTION INTERNAL FIXATION (ORIF) DISTAL RADIAL FRACTURE Left 03/20/2021  ? Procedure: OPEN REDUCTION INTERNAL FIXATION (ORIF) DISTAL RADIAL FRACTURE;  Surgeon: Teryl Lucy, MD;  Location: [facility] OR;  Service: Orthopedics;  Laterality: Left;  ? RADIOLOGY WITH ANESTHESIA N/A 05/09/2014  ? Procedure: ADULT SEDATION WITH ANESTHESIA/MRI CERVICAL SPINE WITHOUT CONTRAST;  Surgeon: Medication Radiologist, MD;  Location: [facility] OR;  Service: Radiology;  Laterality: N/A;  DR. HAWKS/MRI  ? right knee arthroscopy    ? d/t meniscal tear  ? SHOULDER ARTHROSCOPY W/ ROTATOR CUFF REPAIR Bilateral three times each over several yrs  ? SKIN FULL THICKNESS GRAFT Right 03/12/2022  ? Procedure: SKIN GRAFT FULL THICKNESS;  Surgeon: Nadara Mustard, MD;  Location: [facility] OR;  Service: Orthopedics;  Laterality: Right;   ? SKIN SPLIT GRAFT Right 02/17/2022  ? Procedure: RIGHT LEG SKIN GRAFT;  Surgeon: Nadara Mustard, MD;  Location: [facility] OR;  Service: Orthopedics;  Laterality: Right;  ? THUMB ARTHROSCOPY Left   ? 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 Procedure: RIGHT TOTAL KNEE ARTHROPLASTY;  Surgeon: Jodi Geralds, MD;  Location: [facility] OR;  Service: Orthopedics;  Laterality: Right;  ? UMBILICAL HERNIA REPAIR N/A 07/15/2014  ? Procedure: LAPAROSCOPIC UMBILICAL AND INFRAUMBILICAL HERNIA;  Surgeon: Ardeth Sportsman, MD;  Location: [facility] OR;  Service: General;  Laterality: N/A;  ? ?Social History  ? ?Occupational History  ? Not on  file  ?Tobacco Use  ? Smoking status: Former  ?  Packs/day: 0.50  ?  Years: 2.00  ?  Pack years: 1.00  ?  Types: Cigarettes  ?  Quit date: 02/07/1992  ?  Years since quitting: 30.1  ? Smokeless tobacco: Never  ?Vaping Use  ? Vaping Use: Never used  ?Substance and Sexual Activity  ? Alcohol use: No  ? Drug use: No  ? Sexual activity: Not on file  ? ? ?

## 2022-04-13 ENCOUNTER — Ambulatory Visit (INDEPENDENT_AMBULATORY_CARE_PROVIDER_SITE_OTHER): Payer: Medicare Other | Admitting: Orthopedic Surgery

## 2022-04-13 ENCOUNTER — Encounter: Payer: Self-pay | Admitting: Orthopedic Surgery

## 2022-04-13 DIAGNOSIS — L97912 Non-pressure chronic ulcer of unspecified part of right lower leg with fat layer exposed: Secondary | ICD-10-CM

## 2022-04-13 DIAGNOSIS — Z9889 Other specified postprocedural states: Secondary | ICD-10-CM

## 2022-04-13 NOTE — Progress Notes (Signed)
? ?Office Visit Note ?  ?Patient: Isabel Barnes           ?Date of Birth: [birth year]           ?MRN: [id number] ?Visit Date: 04/13/2022 ?             ?Requested by: Ardith Dark, PA-C ?[address] ?[address] ?[place],  [place] [address] ?PCP: Ardith Dark, PA-C ? ?Chief Complaint  ?Patient presents with  ? Right Leg - Routine Post Op  ?  02/17/22 RLE deb with Kerecis graft   ? ? ? ? ?HPI: ?Patient is an 81 year old woman who presents in follow-up status post Kerecis tissue graft for massive ulceration right lower extremity.  Patient has a history of lymphedema and venous insufficiency she does have a lymphedema pump but is not using it.  Patient is washing the wound with soap and water.  She states she has not been compliant with elevation. ? ?Assessment & Plan: ?Visit Diagnoses:  ?1. Chronic ulcer of right leg, with fat layer exposed ([place])   ?2. History of artificial skin graft   ? ? ?Plan: Recommended elevation working the calf muscles with wiggling the toes and ankle to help pump the fluid up the leg.  Recommend she use the lymphedema pump every day.  Continue with Dial soap cleansing and Ace compression wrap.  Once the swelling has resolved we can proceed with additional skin graft. ? ?Follow-Up Instructions: Return in about 1 week (around 04/20/2022).  ? ?Ortho Exam ? ?Patient is alert, oriented, no adenopathy, well-dressed, normal affect, normal respiratory effort. ?Examination patient has increased swelling in the right lower extremity at the calf is 44 cm in circumference there is brawny edema the wound bed is healthy viable with 100% healthy granulation tissue she has new blisters from swelling in the posterior aspect of her calf.  The wound is 16 x 10 cm and 3 mm deep.  There is no odor no drainage. ? ?Imaging: ?No results found. ?No images are attached to the encounter. ? ?Labs: ?Lab Results  ?Component Value Date  ? ESRSEDRATE 40 (H) 02/07/2022  ? CRP 20.5 (H) 02/07/2022  ? REPTSTATUS  02/16/2022 FINAL 02/10/2022  ? GRAMSTAIN  02/10/2022  ?  RARE WBC PRESENT, PREDOMINANTLY PMN ?NO ORGANISMS SEEN ?  ? CULT  02/10/2022  ?  RARE ENTEROCOCCUS FAECALIS ?RARE BACTEROIDES SPECIES NOT FRAGILIS ?BETA LACTAMASE POSITIVE ?Performed at [facility], [place] [address] [place], [place] [address] ?  ? LABORGA ENTEROCOCCUS FAECALIS 02/10/2022  ? ? ? ?Lab Results  ?Component Value Date  ? ALBUMIN 1.5 (L) 02/09/2022  ? ALBUMIN 2.1 (L) 02/06/2022  ? ALBUMIN 3.7 06/25/2017  ? ? ?Lab Results  ?Component Value Date  ? MG 2.0 02/12/2022  ? MG 1.8 02/07/2022  ? ?Lab Results  ?Component Value Date  ? VD25OH 69.88 02/07/2022  ? ? ?No results found for: PREALBUMIN ? ?  Latest Ref Rng & Units 03/16/2022  ? 12:00 AM 03/04/2022  ? 12:00 AM 02/25/2022  ?  1:16 AM  ?CBC EXTENDED  ?WBC  6.9      5.7      10.0    ?RBC 3.87 - 5.11 2.96      3.43      2.98    ?Hemoglobin 12.0 - 16.0 8.6      9.8      8.2    ?HCT 36 - 46 26      29  26.7    ?Platelets 150 - 400 K/uL 272      420      375    ?NEUT#   3.70      6.3    ?Lymph# 0.7 - 4.0 K/uL   2.1    ?  ? This result is from an external source.  ? ? ? ?There is no height or weight on file to calculate BMI. ? ?Orders:  ?No orders of the defined types were placed in this encounter. ? ?No orders of the defined types were placed in this encounter. ? ? ? Procedures: ?No procedures performed ? ?Clinical Data: ?No additional findings. ? ?ROS: ? ?All other systems negative, except as noted in the HPI. ?Review of Systems ? ?Objective: ?Vital Signs: There were no vitals taken for this visit. ? ?Specialty Comments:  ?No specialty comments available. ? ?PMFS History: ?Patient Active Problem List  ? Diagnosis Date Noted  ? Abscess of right lower leg   ? Severe protein-calorie malnutrition ([place])   ? PVD (peripheral vascular disease) ([place])   ? Fall   ? Atrial fibrillation ([place])   ? Goals of care, counseling/discussion   ? Anemia   ? Fluid collection (edema) in the arms, legs, hands and feet   ?  Cellulitis of right lower extremity 02/06/2022  ? Chest pain, rule out acute myocardial infarction 03/16/2021  ? HTN (hypertension) 03/16/2021  ? Hypothyroidism 03/16/2021  ? HLD (hyperlipidemia) 03/16/2021  ? Aortic valve stenosis 03/16/2021  ? Chronic venous insufficiency of lower extremity 03/16/2021  ? Short-term memory loss 12/13/2019  ? Psoriatic arthritis ([place]) 12/13/2019  ? Primary osteoarthritis of right knee 07/16/2016  ? Eosinophilia 06/16/2015  ? Chronic pain disorder   ? Osteoarthritis   ? Incisional umbilical hernia, without obstruction or gangrene   ? Iron deficiency anemia 09/19/2012  ? ?Past Medical History:  ?Diagnosis Date  ? Anemia   ? iron deficiency hx.has had iron infusions before   ? Chronic low back pain   ? Chronic pain disorder   ? Complication of anesthesia   ? severe claustrophobia  ? Constipation   ? r/t use of pain meds.Takes OTC meds or eats prunes  ? CVA (cerebral vascular accident) [facility])   ? 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 Depression   ? GERD (gastroesophageal reflux disease)   ? takes Omeprazole daily  ? Heart murmur   ? mild MS, moderate-severe AS 03/17/21 echo  ? History of bronchitis   ? 20+ yrs ago  ? History of kidney stones   ? 3 surgerical removed, 1 passed  ? History of prolapse of bladder   ? History of shingles   ? Hypertension   ? takes Losartan daily  ? Hypothyroidism   ? takes Synthroid daily  ? Joint swelling   ? Neck pain   ? bone spurs at base of head per pt  ? Osteoarthritis   ? lumbar,cervical,joints  ? Pneumonia   ? hx of > 20 yrs ago  ? Shortness of breath   ? occasionally and with exertion. Albuterol inhaler as needed  ? Spinal headache 1991  ? blood patch placed  ? Spondylitis ([place])   ? Unsteady gait   ? occasionally  ? Urinary urgency   ?  ?Family History  ?Problem Relation Age of Onset  ? Stroke Father   ?  ?Past Surgical History:  ?Procedure Laterality Date  ? ABDOMINAL AORTOGRAM W/LOWER EXTREMITY N/A 02/15/2022  ?  Procedure:  ABDOMINAL AORTOGRAM W/LOWER EXTREMITY;  Surgeon: Waynetta Sandy, MD;  Location: [place] CV LAB;  Service: Cardiovascular;  Laterality: N/A;  ? ABDOMINAL HYSTERECTOMY    ? ANTERIOR FUSION CERVICAL SPINE    ? x2 -C4-7  ? APPLICATION OF WOUND VAC Right 03/12/2022  ? Procedure: APPLICATION OF WOUND VAC;  Surgeon: Newt Minion, MD;  Location: [place];  Service: Orthopedics;  Laterality: Right;  ? BUNIONECTOMY Bilateral   ? COLONOSCOPY    ? CYSTOSCOPY W/ URETEROSCOPY  2012  ? EYE SURGERY Bilateral   ? cataract /lens implant  ? HOLMIUM LASER APPLICATION Left [id number]  ? Procedure: HOLMIUM LASER APPLICATION;  Surgeon: Malka So, MD;  [facility];  Service: Urology;  Laterality: Left;  ? I & D EXTREMITY Right 02/10/2022  ? Procedure: DEBRIDEMENT RIGHT LEG ABSCESS;  Surgeon: Newt Minion, MD;  Location: [place];  Service: Orthopedics;  Laterality: Right;  ? I & D EXTREMITY Right 03/12/2022  ? Procedure: RIGHT LEG IRRIGATION AND DEBRIDEMENT EXTREMITY;  Surgeon: Newt Minion, MD;  Location: [place];  Service: Orthopedics;  Laterality: Right;  ? INSERTION OF MESH N/A 07/15/2014  ? Procedure: INSERTION OF MESH;  Surgeon: Adin Hector, MD;  Location: [place];  Service: General;  Laterality: N/A;  ? JOINT REPLACEMENT Right 2012  ? shoulder  ? LAPAROSCOPIC CHOLECYSTECTOMY W/ CHOLANGIOGRAPHY  2012  ? Dr Ninfa Linden  ? NASAL SINUS SURGERY    ? OPEN REDUCTION INTERNAL FIXATION (ORIF) DISTAL RADIAL FRACTURE Left 03/20/2021  ? Procedure: OPEN REDUCTION INTERNAL FIXATION (ORIF) DISTAL RADIAL FRACTURE;  Surgeon: Marchia Bond, MD;  Location: [place];  Service: Orthopedics;  Laterality: Left;  ? RADIOLOGY WITH ANESTHESIA N/A 05/09/2014  ? Procedure: ADULT SEDATION WITH ANESTHESIA/MRI CERVICAL SPINE WITHOUT CONTRAST;  Surgeon: Medication Radiologist, MD;  Location: [place];  Service: Radiology;  Laterality: N/A;  DR. HAWKS/MRI  ? right knee arthroscopy    ? d/t meniscal tear  ? SHOULDER ARTHROSCOPY W/ ROTATOR  CUFF REPAIR Bilateral three times each over several yrs  ? SKIN FULL THICKNESS GRAFT Right 03/12/2022  ? Procedure: SKIN GRAFT FULL THICKNESS;  Surgeon: Newt Minion, MD;  Location: [place];  Service: Orthopedics;

## 2022-04-15 NOTE — Telephone Encounter (Signed)
Pt has a lymphedema pump and she was advised to restart use ( did not know that she had this) 1 hour of wear daily, continue with current dressing change regimen. Had been advised at this weeks visit that if the swelling gets resolved we can move forward with another debridement and graft surgery. Still unable to get any home health agency to accept that pt for services and will have to close out this request. The pt and care giver are aware. There may be the ability to change this in the future if she has repeat debridement

## 2022-04-17 ENCOUNTER — Other Ambulatory Visit: Payer: Self-pay | Admitting: Adult Health

## 2022-04-17 DIAGNOSIS — K922 Gastrointestinal hemorrhage, unspecified: Secondary | ICD-10-CM

## 2022-04-17 DIAGNOSIS — D500 Iron deficiency anemia secondary to blood loss (chronic): Secondary | ICD-10-CM

## 2022-04-21 ENCOUNTER — Telehealth: Payer: Self-pay | Admitting: Family

## 2022-04-21 NOTE — Telephone Encounter (Signed)
Pt's daughter Jacki Cones called. She is asking for a call back tomorrow after her mother's appt. Jacki Cones states her mom has blisters and would like follow up on her mother's condition. [place] to keep up with what is going on her pty. Please call her at [phone].

## 2022-04-21 NOTE — Telephone Encounter (Signed)
Will hold for appt tomorrow and request that we call pt's daughter at the time of the visit so pt and daughter can hear what is discussed at the same time.

## 2022-04-22 ENCOUNTER — Ambulatory Visit (INDEPENDENT_AMBULATORY_CARE_PROVIDER_SITE_OTHER): Payer: Medicare Other | Admitting: Orthopedic Surgery

## 2022-04-22 DIAGNOSIS — L97912 Non-pressure chronic ulcer of unspecified part of right lower leg with fat layer exposed: Secondary | ICD-10-CM | POA: Diagnosis not present

## 2022-04-22 NOTE — Telephone Encounter (Signed)
Missed call while pt in office. Can you please call to discuss appt with pt's daughter

## 2022-04-27 ENCOUNTER — Ambulatory Visit (INDEPENDENT_AMBULATORY_CARE_PROVIDER_SITE_OTHER): Payer: Medicare Other | Admitting: Orthopedic Surgery

## 2022-04-27 DIAGNOSIS — L97912 Non-pressure chronic ulcer of unspecified part of right lower leg with fat layer exposed: Secondary | ICD-10-CM

## 2022-04-28 ENCOUNTER — Encounter: Payer: Self-pay | Admitting: Orthopedic Surgery

## 2022-04-28 NOTE — Progress Notes (Signed)
Office Visit Note   Patient: Isabel Barnes           Date of Birth: 02/17/1941           MRN: [id number] Visit Date: 04/27/2022              Requested by: Jamal CollinHedgecock, Suzanne, PA-C [address] [place],  [place] [id number] PCP: Jamal CollinHedgecock, Suzanne, [place]  Chief Complaint  Patient presents with   Right Leg - Routine Post Op    03/12/22 RLE deb and kerecis grfat       HPI: Patient is approxi-6 weeks status post debridement venous ulceration right leg application of Kerecis graft.  Due to patient's inability to control the swelling on her own we have proceeded with twice a week compression wraps.  Assessment & Plan: Visit Diagnoses:  1. Chronic ulcer of right leg, with fat layer exposed (HCC)     Plan: Patient is showing excellent improvement with a compression wrap.  Follow-up Friday and Tuesday.  Follow-Up Instructions: Return in about 1 week (around 05/04/2022).   Ortho Exam  Patient is alert, oriented, no adenopathy, well-dressed, normal affect, normal respiratory effort. Examination there is fibrinous exudate over the wound the skin now has some wrinkling there is still swelling but an improvement.  The fibrinous exudate was debrided there is improved granulation tissue in the wound bed.  Imaging: No results found. No images are attached to the encounter.  Labs: Lab Results  Component Value Date   ESRSEDRATE 40 (H) 02/07/2022   CRP 20.5 (H) 02/07/2022   REPTSTATUS 02/16/2022 FINAL 02/10/2022   GRAMSTAIN  02/10/2022    RARE WBC PRESENT, PREDOMINANTLY PMN NO ORGANISMS SEEN    CULT  02/10/2022    RARE ENTEROCOCCUS FAECALIS RARE BACTEROIDES SPECIES NOT FRAGILIS BETA LACTAMASE POSITIVE Performed at [facility], [address] [place], [place] [address]    [facility] 02/10/2022     Lab Results  Component Value Date   ALBUMIN 1.5 (L) 02/09/2022   ALBUMIN 2.1 (L) 02/06/2022   ALBUMIN 3.7 06/25/2017    Lab Results   Component Value Date   MG 2.0 02/12/2022   MG 1.8 02/07/2022   Lab Results  Component Value Date   VD25OH 69.88 02/07/2022    No results found for: PREALBUMIN    Latest Ref Rng & Units 03/16/2022   12:00 AM 03/04/2022   12:00 AM 02/25/2022    1:16 AM  CBC EXTENDED  WBC  6.9      5.7      10.0    RBC 3.87 - 5.11 2.96      3.43      2.98    Hemoglobin 12.0 - 16.0 8.6      9.8      8.2    HCT 36 - 46 26      29      26.7    Platelets 150 - 400 K/uL 272      420      375    NEUT#   3.70      6.3    Lymph# 0.7 - 4.0 K/uL   2.1       This result is from an external source.     There is no height or weight on file to calculate BMI.  Orders:  No orders of the defined types were placed in this encounter.  No orders of the defined types were placed in  this encounter.    Procedures: No procedures performed  Clinical Data: No additional findings.  ROS:  All other systems negative, except as noted in the HPI. Review of Systems  Objective: Vital Signs: There were no vitals taken for this visit.  Specialty Comments:  No specialty comments available.  PMFS History: Patient Active Problem List   Diagnosis Date Noted   Abscess of right lower leg    Severe protein-calorie malnutrition (HCC)    PVD (peripheral vascular disease) (HCC)    Fall    Atrial fibrillation (HCC)    Goals of care, counseling/discussion    Anemia    Fluid collection (edema) in the arms, legs, hands and feet    Cellulitis of right lower extremity 02/06/2022   Chest pain, rule out acute myocardial infarction 03/16/2021   HTN (hypertension) 03/16/2021   Hypothyroidism 03/16/2021   HLD (hyperlipidemia) 03/16/2021   Aortic valve stenosis 03/16/2021   Chronic venous insufficiency of lower extremity 03/16/2021   Short-term memory loss 12/13/2019   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   Primary osteoarthritis of right knee 07/16/2016   Eosinophilia 06/16/2015   Chronic pain disorder     Osteoarthritis    Incisional umbilical hernia, without obstruction or gangrene    Iron deficiency anemia 09/19/2012   Past Medical History:  Diagnosis Date   Anemia    iron deficiency hx.has had iron infusions before    Chronic low back pain    Chronic pain disorder    Complication of anesthesia    severe claustrophobia   Constipation    r/t use of pain meds.Takes OTC meds or eats prunes   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HCC)    Unsteady gait    occasionally   Urinary urgency     Family History  Problem Relation Age of Onset   Stroke Father     Past Surgical History:  Procedure Laterality Date   ABDOMINAL AORTOGRAM W/LOWER EXTREMITY N/A 02/15/2022   Procedure: ABDOMINAL AORTOGRAM W/LOWER EXTREMITY;  Surgeon: Maeola Harman, MD;  Location: [facility] INVASIVE CV LAB;  Service: Cardiovascular;  Laterality: N/A;   ABDOMINAL HYSTERECTOMY     ANTERIOR FUSION CERVICAL SPINE     x2 -C4-7   APPLICATION OF WOUND VAC Right 03/12/2022   Procedure: APPLICATION OF WOUND VAC;  Surgeon: Nadara Mustard, MD;  [facility]: [facility] OR;  Service: Orthopedics;  Laterality: Right;   BUNIONECTOMY Bilateral    COLONOSCOPY     CYSTOSCOPY W/ URETEROSCOPY  2012   EYE  SURGERY Bilateral  cataract /lens implant   HOLMIUM LASER APPLICATION Left [id number]   Procedure: HOLMIUM LASER APPLICATION;  Surgeon: Malka So, MD;  Location: [facility];  Service: Urology;  Laterality: Left;   I & D EXTREMITY Right 02/10/2022   Procedure: DEBRIDEMENT RIGHT LEG ABSCESS;  Surgeon: Newt Minion, MD;  Location: [place];  Service: Orthopedics;  Laterality: Right;   I & D EXTREMITY Right 03/12/2022   Procedure: RIGHT LEG IRRIGATION AND DEBRIDEMENT EXTREMITY;  Surgeon: Newt Minion, MD;  Location: [place];  Service: Orthopedics;  Laterality: Right;   INSERTION OF MESH N/A 07/15/2014   Procedure: INSERTION OF MESH;  Surgeon: Adin Hector, MD;  Location: [place];  Service: General;  Laterality: N/A;   JOINT REPLACEMENT Right 2012   shoulder   LAPAROSCOPIC CHOLECYSTECTOMY W/ CHOLANGIOGRAPHY  2012   Dr Ninfa Linden   NASAL SINUS SURGERY     OPEN REDUCTION INTERNAL FIXATION (ORIF) DISTAL RADIAL FRACTURE Left 03/20/2021   Procedure: OPEN REDUCTION INTERNAL FIXATION (ORIF) DISTAL RADIAL FRACTURE;  Surgeon: Marchia Bond, MD;  Location: [place];  Service: Orthopedics;  Laterality: Left;   RADIOLOGY WITH ANESTHESIA N/A 05/09/2014   Procedure: ADULT SEDATION WITH ANESTHESIA/MRI CERVICAL SPINE WITHOUT CONTRAST;  Surgeon: Medication Radiologist, MD;  Location: [place];  Service: Radiology;  Laterality: N/A;  DR. HAWKS/MRI   right knee arthroscopy     d/t meniscal tear   SHOULDER ARTHROSCOPY W/ ROTATOR CUFF REPAIR Bilateral three times each over several yrs   SKIN FULL THICKNESS GRAFT Right 03/12/2022   Procedure: SKIN GRAFT FULL THICKNESS;  Surgeon: Newt Minion, MD;  Location: [place];  Service: Orthopedics;  Laterality: Right;   SKIN SPLIT GRAFT Right 02/17/2022   Procedure: RIGHT LEG SKIN GRAFT;  Surgeon: Newt Minion, MD;  Location: [place];  Service: Orthopedics;  Laterality: Right;   THUMB ARTHROSCOPY Left    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   Procedure: RIGHT TOTAL  KNEE ARTHROPLASTY;  Surgeon: Dorna Leitz, MD;  Location: [place];  Service: Orthopedics;  Laterality: Right;   UMBILICAL HERNIA REPAIR N/A 07/15/2014   Procedure: LAPAROSCOPIC UMBILICAL AND INFRAUMBILICAL HERNIA;  Surgeon: Adin Hector, MD;  Location: [place];  Service: General;  Laterality: N/A;   Social History   Occupational History   Not on file  Tobacco Use   Smoking status: Former    Packs/day: 0.50    Years: 2.00    Pack years: 1.00    Types: Cigarettes    Quit date: 02/07/1992    Years since quitting: 30.2   Smokeless tobacco: Never  Vaping Use   Vaping Use: Never used  Substance and Sexual Activity   Alcohol use: No   Drug use: No   Sexual activity: Not on file

## 2022-04-28 NOTE — Progress Notes (Signed)
Office Visit Note   Patient: Isabel Barnes           Date of Birth: 01-19-1941           MRN: [id number] Visit Date: 04/22/2022              Requested by: Ardith Dark, PA-C [id number] [place],  [place] [address] PCP: Ardith Dark, [place]  Chief Complaint  Patient presents with   Right Leg - Routine Post Op    02/17/2022 RLE debridement and Kerecis graft       HPI: Patient is an 81 year old woman who is seen in follow-up status post debridement right lower extremity and application of Kerecis tissue graft.  Patient complains of yellow drainage.  She has been undergoing dry dressing changes.  She has not been on antibiotics she denies any pain.  Assessment & Plan: Visit Diagnoses:  1. Chronic ulcer of right leg, with fat layer exposed ([place])     Plan: Patient will start Dynaflex compression wraps we will plan for treatment twice a week to help decrease the swelling.  Patient has not had sufficient decrease swelling with her lymphedema pumps.  Follow-Up Instructions: Return in about 1 week (around 04/29/2022).   Ortho Exam  Patient is alert, oriented, no adenopathy, well-dressed, normal affect, normal respiratory effort. Examination patient has increased swelling of the right lower extremity her calf is 43 cm in circumference.  There is fibrinous exudative tissue over the wound the leg is venous dermatitis.  Patient states she does not feel like she can get a compression sock on.  Imaging: No results found. No images are attached to the encounter.  Labs: Lab Results  Component Value Date   ESRSEDRATE 40 (H) 02/07/2022   CRP 20.5 (H) 02/07/2022   REPTSTATUS 02/16/2022 FINAL 02/10/2022   GRAMSTAIN  02/10/2022    RARE WBC PRESENT, PREDOMINANTLY PMN NO ORGANISMS SEEN    CULT  02/10/2022    RARE ENTEROCOCCUS FAECALIS RARE BACTEROIDES SPECIES NOT FRAGILIS BETA LACTAMASE POSITIVE Performed at [facility], [place] [address], [place],  [place] [address]    LABORGA ENTEROCOCCUS FAECALIS 02/10/2022     Lab Results  Component Value Date   ALBUMIN 1.5 (L) 02/09/2022   ALBUMIN 2.1 (L) 02/06/2022   ALBUMIN 3.7 06/25/2017    Lab Results  Component Value Date   MG 2.0 02/12/2022   MG 1.8 02/07/2022   Lab Results  Component Value Date   VD25OH 69.88 02/07/2022    No results found for: PREALBUMIN    Latest Ref Rng & Units 03/16/2022   12:00 AM 03/04/2022   12:00 AM 02/25/2022    1:16 AM  CBC EXTENDED  WBC  6.9      5.7      10.0    RBC 3.87 - 5.11 2.96      3.43      2.98    Hemoglobin 12.0 - 16.0 8.6      9.8      8.2    HCT 36 - 46 26      29      26.7    Platelets 150 - 400 K/uL 272      420      375    NEUT#   3.70      6.3    Lymph# 0.7 - 4.0 K/uL   2.1       This result is from an external source.  There is no height or weight on file to calculate BMI.  Orders:  No orders of the defined types were placed in this encounter.  No orders of the defined types were placed in this encounter.    Procedures: No procedures performed  Clinical Data: No additional findings.  ROS:  All other systems negative, except as noted in the HPI. Review of Systems  Objective: Vital Signs: There were no vitals taken for this visit.  Specialty Comments:  No specialty comments available.  PMFS History: Patient Active Problem List   Diagnosis Date Noted   Abscess of right lower leg    Severe protein-calorie malnutrition ([place])    PVD (peripheral vascular disease) ([place])    Fall    Atrial fibrillation ([place])    Goals of care, counseling/discussion    Anemia    Fluid collection (edema) in the arms, legs, hands and feet    Cellulitis of right lower extremity 02/06/2022   Chest pain, rule out acute myocardial infarction 03/16/2021   HTN (hypertension) 03/16/2021   Hypothyroidism 03/16/2021   HLD (hyperlipidemia) 03/16/2021   Aortic valve stenosis 03/16/2021   Chronic venous insufficiency of lower extremity  03/16/2021   Short-term memory loss 12/13/2019   Psoriatic arthritis ([place]) 12/13/2019   Primary osteoarthritis of right knee 07/16/2016   Eosinophilia 06/16/2015   Chronic pain disorder    Osteoarthritis    Incisional umbilical hernia, without obstruction or gangrene    Iron deficiency anemia 09/19/2012   Past Medical History:  Diagnosis Date   Anemia    iron deficiency hx.has had iron infusions before    Chronic low back pain    Chronic pain disorder    Complication of anesthesia    severe claustrophobia   Constipation    r/t use of pain meds.Takes OTC meds or eats prunes   CVA (cerebral vascular accident) ([place])    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[place])    Unsteady gait    occasionally   Urinary urgency     Family History  Problem Relation Age of Onset   Stroke Father     Past Surgical History:  Procedure Laterality Date   ABDOMINAL AORTOGRAM W/LOWER EXTREMITY N/A 02/15/2022   Procedure: ABDOMINAL AORTOGRAM W/LOWER EXTREMITY;  Surgeon: Waynetta Sandy, MD;  Location: [place] CV LAB;  Service: Cardiovascular;  Laterality: N/A;   ABDOMINAL HYSTERECTOMY     ANTERIOR FUSION CERVICAL SPINE     x2 -[id number]   APPLICATION OF WOUND VAC Right 03/12/2022   Procedure:  APPLICATION OF WOUND VAC;  Surgeon: Newt Minion,  MD;  Location: [place];  Service: Orthopedics;  Laterality: Right;   BUNIONECTOMY Bilateral    COLONOSCOPY     CYSTOSCOPY W/ URETEROSCOPY  2012   EYE SURGERY Bilateral    cataract /lens implant   HOLMIUM LASER APPLICATION Left [id number]   Procedure: HOLMIUM LASER APPLICATION;  Surgeon: Malka So, MD;  Location: [facility];  Service: Urology;  Laterality: Left;   I & D EXTREMITY Right 02/10/2022   Procedure: DEBRIDEMENT RIGHT LEG ABSCESS;  Surgeon: Newt Minion, MD;  Location: [place];  Service: Orthopedics;  Laterality: Right;   I & D EXTREMITY Right 03/12/2022   Procedure: RIGHT LEG IRRIGATION AND DEBRIDEMENT EXTREMITY;  Surgeon: Newt Minion, MD;  Location: [place];  Service: Orthopedics;  Laterality: Right;   INSERTION OF MESH N/A 07/15/2014   Procedure: INSERTION OF MESH;  Surgeon: Adin Hector, MD;  Location: [place];  Service: General;  Laterality: N/A;   JOINT REPLACEMENT Right 2012   shoulder   LAPAROSCOPIC CHOLECYSTECTOMY W/ CHOLANGIOGRAPHY  2012   Dr Ninfa Linden   NASAL SINUS SURGERY     OPEN REDUCTION INTERNAL FIXATION (ORIF) DISTAL RADIAL FRACTURE Left 03/20/2021   Procedure: OPEN REDUCTION INTERNAL FIXATION (ORIF) DISTAL RADIAL FRACTURE;  Surgeon: Marchia Bond, MD;  Location: [place];  Service: Orthopedics;  Laterality: Left;   RADIOLOGY WITH ANESTHESIA N/A 05/09/2014   Procedure: ADULT SEDATION WITH ANESTHESIA/MRI CERVICAL SPINE WITHOUT CONTRAST;  Surgeon: Medication Radiologist, MD;  Location: [place];  Service: Radiology;  Laterality: N/A;  DR. HAWKS/MRI   right knee arthroscopy     d/t meniscal tear   SHOULDER ARTHROSCOPY W/ ROTATOR CUFF REPAIR Bilateral three times each over several yrs   SKIN FULL THICKNESS GRAFT Right 03/12/2022   Procedure: SKIN GRAFT FULL THICKNESS;  Surgeon: Newt Minion, MD;  Location: [place];  Service: Orthopedics;  Laterality: Right;   SKIN SPLIT GRAFT Right 02/17/2022   Procedure: RIGHT  LEG SKIN GRAFT;  Surgeon: Newt Minion, MD;  Location: [place];  Service: Orthopedics;  Laterality: Right;   THUMB ARTHROSCOPY Left    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   Procedure: RIGHT TOTAL KNEE ARTHROPLASTY;  Surgeon: Dorna Leitz, MD;  Location: [place];  Service: Orthopedics;  Laterality: Right;   UMBILICAL HERNIA REPAIR N/A 07/15/2014   Procedure: LAPAROSCOPIC UMBILICAL AND INFRAUMBILICAL HERNIA;  Surgeon: Adin Hector, MD;  Location: [place];  Service: General;  Laterality: N/A;   Social History   Occupational History   Not on file  Tobacco Use   Smoking status: Former    Packs/day: 0.50    Years: 2.00    Pack years: 1.00    Types: Cigarettes    Quit date: 02/07/1992    Years since quitting: 30.2   Smokeless tobacco: Never  Vaping Use   Vaping Use: Never used  Substance and Sexual Activity   Alcohol use: No   Drug use: No   Sexual activity: Not on file

## 2022-04-30 ENCOUNTER — Ambulatory Visit (INDEPENDENT_AMBULATORY_CARE_PROVIDER_SITE_OTHER): Payer: Medicare Other

## 2022-04-30 ENCOUNTER — Encounter: Payer: Medicare Other | Admitting: Family

## 2022-04-30 DIAGNOSIS — Z09 Encounter for follow-up examination after completed treatment for conditions other than malignant neoplasm: Secondary | ICD-10-CM

## 2022-04-30 NOTE — Progress Notes (Deleted)
Patient came in for a change of compression dressing. Removed profore dressing and applied a new one. She will come in again next Tuesday and Friday for compression wrap changes, as planned.

## 2022-04-30 NOTE — Progress Notes (Signed)
Patient came in for a change of compression dressing. Removed profore dressing and applied a new one. She will come in again next Tuesday and Friday for compression wrap changes, as planned.  

## 2022-05-04 ENCOUNTER — Ambulatory Visit (INDEPENDENT_AMBULATORY_CARE_PROVIDER_SITE_OTHER): Payer: Medicare Other | Admitting: Orthopedic Surgery

## 2022-05-04 DIAGNOSIS — L97912 Non-pressure chronic ulcer of unspecified part of right lower leg with fat layer exposed: Secondary | ICD-10-CM

## 2022-05-07 ENCOUNTER — Encounter: Payer: Self-pay | Admitting: Family

## 2022-05-07 ENCOUNTER — Ambulatory Visit (INDEPENDENT_AMBULATORY_CARE_PROVIDER_SITE_OTHER): Payer: Medicare Other | Admitting: Family

## 2022-05-07 DIAGNOSIS — L03115 Cellulitis of right lower limb: Secondary | ICD-10-CM

## 2022-05-07 DIAGNOSIS — I87332 Chronic venous hypertension (idiopathic) with ulcer and inflammation of left lower extremity: Secondary | ICD-10-CM

## 2022-05-07 DIAGNOSIS — L97912 Non-pressure chronic ulcer of unspecified part of right lower leg with fat layer exposed: Secondary | ICD-10-CM

## 2022-05-07 DIAGNOSIS — Z9889 Other specified postprocedural states: Secondary | ICD-10-CM

## 2022-05-07 DIAGNOSIS — I872 Venous insufficiency (chronic) (peripheral): Secondary | ICD-10-CM

## 2022-05-07 NOTE — Progress Notes (Signed)
Office Visit Note   Patient: Isabel Barnes           Date of Birth: 1941-11-04           MRN: [id number] Visit Date: 05/07/2022              Requested by: Ardith Dark, [place] [id number] [place],  [place] [address] PCP: Ardith Dark, PA-C  No chief complaint on file.     HPI: The patient is an 81 year old woman seen today in routine follow-up.  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  She has been having twice weekly Dynaflex compression wraps.  She states she is unable to elevate her legs at home or provide her own compression.  Assessment & Plan: Visit Diagnoses: No diagnosis found.  Plan: She will be seen in the office next  Thursday for reevaluation.  Dynaflex compression wrap applied to the left lower extremity.  Follow-Up Instructions: No follow-ups on file.   Ortho Exam  Patient is alert, oriented, no adenopathy, well-dressed, normal affect, normal respiratory effort. On examination of the right lower extremity there is thin fibrinous exudative tissue over the ulcer there is circumferential epithelialization.  There is granulation tissue in the full wound bed.  There continues to be significant edema despite compression she has a fluid-filled blister serous fluid.  Proximal to the area of the compression.  She also has new fluid-filled blisters to the left lower extremity with significant venous insufficiency swelling.  Pitting.  Imaging: No results found. No images are attached to the encounter.  Labs: Lab Results  Component Value Date   ESRSEDRATE 40 (H) 02/07/2022   CRP 20.5 (H) 02/07/2022   REPTSTATUS 02/16/2022 FINAL 02/10/2022   GRAMSTAIN  02/10/2022    RARE WBC PRESENT, PREDOMINANTLY PMN NO ORGANISMS SEEN    CULT  02/10/2022    RARE ENTEROCOCCUS FAECALIS RARE BACTEROIDES SPECIES NOT FRAGILIS BETA LACTAMASE POSITIVE Performed at [facility], [place] [address], [place], [place] [address]    LABORGA ENTEROCOCCUS FAECALIS 02/10/2022     Lab Results  Component Value Date   ALBUMIN 1.5 (L) 02/09/2022   ALBUMIN 2.1 (L) 02/06/2022   ALBUMIN 3.7 06/25/2017    Lab Results  Component Value Date   MG 2.0 02/12/2022   MG 1.8 02/07/2022   Lab Results  Component Value Date   VD25OH 69.88 02/07/2022    No results found for: "PREALBUMIN"    Latest Ref Rng & Units 03/16/2022   12:00 AM 03/04/2022   12:00 AM 02/25/2022    1:16 AM  CBC EXTENDED  WBC  6.9     5.7     10.0   RBC 3.87 - 5.11 2.96     3.43     2.98   Hemoglobin 12.0 - 16.0 8.6     9.8     8.2   HCT 36 - 46 26     29     26.7   Platelets 150 - 400 K/uL 272     420     375   NEUT#   3.70     6.3   Lymph# 0.7 - 4.0 K/uL   2.1      This result is from an external source.     There is no height or weight on file to calculate BMI.  Orders:  No orders of the defined types were placed  in this encounter.  No orders of the defined types were placed in this encounter.    Procedures: No procedures performed  Clinical Data: No additional findings.  ROS:  All other systems negative, except as noted in the HPI. Review of Systems  Objective: Vital Signs: There were no vitals taken for this visit.  Specialty Comments:  No specialty comments available.  PMFS History: Patient Active Problem List   Diagnosis Date Noted   Abscess of right lower leg    Severe protein-calorie malnutrition ([facility])    PVD (peripheral vascular disease) ([facility])    Fall    Atrial fibrillation ([place])    Goals of care, counseling/discussion    Anemia    Fluid collection (edema) in the arms, legs, hands and feet    Cellulitis of right lower extremity 02/06/2022   Chest pain, rule out acute myocardial infarction 03/16/2021   HTN (hypertension) 03/16/2021   Hypothyroidism 03/16/2021   HLD (hyperlipidemia) 03/16/2021   Aortic valve stenosis 03/16/2021   Chronic venous insufficiency of  lower extremity 03/16/2021   Short-term memory loss 12/13/2019   Psoriatic arthritis ([place]) 12/13/2019   Primary osteoarthritis of right knee 07/16/2016   Eosinophilia 06/16/2015   Chronic pain disorder    Osteoarthritis    Incisional umbilical hernia, without obstruction or gangrene    Iron deficiency anemia 09/19/2012   Past Medical History:  Diagnosis Date   Anemia    iron deficiency hx.has had iron infusions before    Chronic low back pain    Chronic pain disorder    Complication of anesthesia    severe claustrophobia   Constipation    r/t use of pain meds.Takes OTC meds or eats prunes   CVA (cerebral vascular accident) ([facility])    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[facility])    Unsteady gait    occasionally   Urinary urgency     Family History  Problem Relation Age of Onset   Stroke Father     Past Surgical History:  Procedure Laterality Date   ABDOMINAL AORTOGRAM W/LOWER EXTREMITY N/A 02/15/2022   Procedure: ABDOMINAL AORTOGRAM W/LOWER EXTREMITY;  Surgeon: Waynetta Sandy, MD;  Location: [facility];  Service: Cardiovascular;  Laterality: N/A;   ABDOMINAL HYSTERECTOMY     ANTERIOR FUSION CERVICAL SPINE     x2 -[id number]   APPLICATION OF WOUND VAC Right 03/12/2022    Procedure: APPLICATION OF WOUND VAC;  Surgeon: Newt Minion, MD;  Location: [facility];  Service: Orthopedics;  Laterality: Right;   BUNIONECTOMY Bilateral    COLONOSCOPY  CYSTOSCOPY W/ URETEROSCOPY  2012   EYE SURGERY Bilateral    cataract /lens implant   HOLMIUM LASER APPLICATION Left [id number]   Procedure: HOLMIUM LASER APPLICATION;  Surgeon: Malka So, MD;  Location: [facility];  Service: Urology;  Laterality: Left;   I & D EXTREMITY Right 02/10/2022   Procedure: DEBRIDEMENT RIGHT LEG ABSCESS;  Surgeon: Newt Minion, MD;  Location: [facility];  Service: Orthopedics;  Laterality: Right;   I & D EXTREMITY Right 03/12/2022   Procedure: RIGHT LEG IRRIGATION AND DEBRIDEMENT EXTREMITY;  Surgeon: Newt Minion, MD;  Location: [facility];  Service: Orthopedics;  Laterality: Right;   INSERTION OF MESH N/A 07/15/2014   Procedure: INSERTION OF MESH;  Surgeon: Adin Hector, MD;  Location: [facility];  Service: General;  Laterality: N/A;   JOINT REPLACEMENT Right 2012   shoulder   LAPAROSCOPIC CHOLECYSTECTOMY W/ CHOLANGIOGRAPHY  2012   Dr Ninfa Linden   NASAL SINUS SURGERY     OPEN REDUCTION INTERNAL FIXATION (ORIF) DISTAL RADIAL FRACTURE Left 03/20/2021   Procedure: OPEN REDUCTION INTERNAL FIXATION (ORIF) DISTAL RADIAL FRACTURE;  Surgeon: Marchia Bond, MD;  Location: [facility];  Service: Orthopedics;  Laterality: Left;   RADIOLOGY WITH ANESTHESIA N/A 05/09/2014   Procedure: ADULT SEDATION WITH ANESTHESIA/MRI CERVICAL SPINE WITHOUT CONTRAST;  Surgeon: Medication Radiologist, MD;  Location: [facility];  Service: Radiology;  Laterality: N/A;  DR. HAWKS/MRI   right knee arthroscopy     d/t meniscal tear   SHOULDER ARTHROSCOPY W/ ROTATOR CUFF REPAIR Bilateral three times each over several yrs   SKIN FULL THICKNESS GRAFT Right 03/12/2022   Procedure: SKIN GRAFT FULL THICKNESS;  Surgeon: Newt Minion, MD;  Location: [facility];  Service: Orthopedics;  Laterality: Right;   SKIN SPLIT GRAFT Right 02/17/2022    Procedure: RIGHT LEG SKIN GRAFT;  Surgeon: Newt Minion, MD;  Location: [facility];  Service: Orthopedics;  Laterality: Right;   THUMB ARTHROSCOPY Left    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   Procedure: RIGHT TOTAL KNEE ARTHROPLASTY;  Surgeon: Dorna Leitz, MD;  Location: [facility];  Service: Orthopedics;  Laterality: Right;   UMBILICAL HERNIA REPAIR N/A 07/15/2014   Procedure: LAPAROSCOPIC UMBILICAL AND INFRAUMBILICAL HERNIA;  Surgeon: Adin Hector, MD;  [facility];  Service: General;  Laterality: N/A;   Social History   Occupational History   Not on file  Tobacco Use   Smoking status: Former    Packs/day: 0.50    Years: 2.00    Total pack years: 1.00    Types: Cigarettes    Quit date: 02/07/1992    Years since quitting: 30.2   Smokeless tobacco: Never  Vaping Use   Vaping Use: Never used  Substance and Sexual Activity   Alcohol use: No   Drug use: No   Sexual activity: Not on file

## 2022-05-11 ENCOUNTER — Telehealth: Payer: Self-pay | Admitting: Orthopedic Surgery

## 2022-05-11 NOTE — Telephone Encounter (Signed)
I called and sw pt and she advised that she removed the profore dressing and that she reapplied an ace from the toes to the bend of her knee. Offered pt an appt tomorrow and she states that she is fine and that she wanted to keep her appt Friday. Advised that if anything changes and she has any concerns she can call the office and make an appt for eval with Dr. Lajoyce Corners on Thursday.

## 2022-05-11 NOTE — Telephone Encounter (Signed)
Patient called advised the right leg wrap got wet and she changed it exactly like it was. Patient asked does she need to come in to have the wrap changed or can she wait until her appointment on Friday. Patient said the drainage of the wound is green. The number to contact patient is [phone]

## 2022-05-14 ENCOUNTER — Ambulatory Visit: Payer: Medicare Other

## 2022-05-14 DIAGNOSIS — Z9889 Other specified postprocedural states: Secondary | ICD-10-CM

## 2022-05-14 NOTE — Progress Notes (Signed)
Patient is in the office today for bilateral lower extremity compression dressing change. Profore wraps removed and reapplied without incident. Pt has an appt for next Thursday for follow up with Dr. Lajoyce Corners.

## 2022-05-18 ENCOUNTER — Encounter: Payer: Self-pay | Admitting: Orthopedic Surgery

## 2022-05-18 ENCOUNTER — Ambulatory Visit (INDEPENDENT_AMBULATORY_CARE_PROVIDER_SITE_OTHER): Payer: Medicare Other | Admitting: Orthopedic Surgery

## 2022-05-18 DIAGNOSIS — L97912 Non-pressure chronic ulcer of unspecified part of right lower leg with fat layer exposed: Secondary | ICD-10-CM | POA: Diagnosis not present

## 2022-05-18 DIAGNOSIS — Z9889 Other specified postprocedural states: Secondary | ICD-10-CM

## 2022-05-18 NOTE — Progress Notes (Signed)
Office Visit Note   Patient: Isabel Barnes           Date of Birth: August 03, 1941           MRN: [id number] Visit Date: 05/18/2022              Requested by: Ardith Dark, [place] [id number] [place],  [place] [address] PCP: Ardith Dark, [place]  Chief Complaint  Patient presents with   Right Leg - Routine Post Op    03/12/2022 RLE I&D kerecis 7 x 10 sheet and 95 cm sq micro       HPI: Patient is an 81 year old woman who is status post Kerecis tissue graft for chronic ulcer right leg.  Assessment & Plan: Visit Diagnoses:  1. History of artificial skin graft   2. Chronic ulcer of right leg, with fat layer exposed ([place])     Plan: Continue with current wound care with 3 layer compression wrap and an Ace wrap proximally.  Follow-Up Instructions: Return in about 1 week (around 05/25/2022).   Ortho Exam  Patient is alert, oriented, no adenopathy, well-dressed, normal affect, normal respiratory effort. Examination patient has much improved granulation tissue in the wound bed decreased swelling in the leg and decreased swelling around the knee.  The superficial blisters are resolving.  The wound measures 10 x 15 cm.  Imaging: No results found. No images are attached to the encounter.  Labs: Lab Results  Component Value Date   ESRSEDRATE 40 (H) 02/07/2022   CRP 20.5 (H) 02/07/2022   REPTSTATUS 02/16/2022 FINAL 02/10/2022   GRAMSTAIN  02/10/2022    RARE WBC PRESENT, PREDOMINANTLY PMN NO ORGANISMS SEEN    CULT  02/10/2022    RARE ENTEROCOCCUS FAECALIS RARE BACTEROIDES SPECIES NOT FRAGILIS BETA LACTAMASE POSITIVE Performed at [facility], [place] [address] [place], [place] [address]    LABORGA ENTEROCOCCUS FAECALIS 02/10/2022     Lab Results  Component Value Date   ALBUMIN 1.5 (L) 02/09/2022   ALBUMIN 2.1 (L) 02/06/2022   ALBUMIN 3.7 06/25/2017    Lab Results  Component Value Date   MG 2.0 02/12/2022   MG 1.8 02/07/2022   Lab  Results  Component Value Date   VD25OH 69.88 02/07/2022    No results found for: "PREALBUMIN"    Latest Ref Rng & Units 03/16/2022   12:00 AM 03/04/2022   12:00 AM 02/25/2022    1:16 AM  CBC EXTENDED  WBC  6.9     5.7     10.0   RBC 3.87 - 5.11 2.96     3.43     2.98   Hemoglobin 12.0 - 16.0 8.6     9.8     8.2   HCT 36 - 46 26     29     26.7   Platelets 150 - 400 K/uL 272     420     375   NEUT#   3.70     6.3   Lymph# 0.7 - 4.0 K/uL   2.1      This result is from an external source.     There is no height or weight on file to calculate BMI.  Orders:  No orders of the defined types were placed in this encounter.  No orders of the defined types were placed in this encounter.    Procedures: No procedures performed  Clinical Data: No additional findings.  ROS:  All other systems  negative, except as noted in the HPI. Review of Systems  Objective: Vital Signs: There were no vitals taken for this visit.  Specialty Comments:  No specialty comments available.  PMFS History: Patient Active Problem List   Diagnosis Date Noted   Abscess of right lower leg    Severe protein-calorie malnutrition ([facility])    PVD (peripheral vascular disease) ([facility])    Fall    Atrial fibrillation ([facility])    Goals of care, counseling/discussion    Anemia    Fluid collection (edema) in the arms, legs, hands and feet    Cellulitis of right lower extremity 02/06/2022   Chest pain, rule out acute myocardial infarction 03/16/2021   HTN (hypertension) 03/16/2021   Hypothyroidism 03/16/2021   HLD (hyperlipidemia) 03/16/2021   Aortic valve stenosis 03/16/2021   Chronic venous insufficiency of lower extremity 03/16/2021   Short-term memory loss 12/13/2019   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   Primary osteoarthritis of right knee 07/16/2016   Eosinophilia 06/16/2015   Chronic pain disorder    Osteoarthritis    Incisional umbilical hernia, without obstruction or gangrene    Iron deficiency  anemia 09/19/2012   Past Medical History:  Diagnosis Date   Anemia    iron deficiency hx.has had iron infusions before    Chronic low back pain    Chronic pain disorder    Complication of anesthesia    severe claustrophobia   Constipation    r/t use of pain meds.Takes OTC meds or eats prunes   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[facility])    Unsteady gait    occasionally   Urinary urgency     Family History  Problem Relation Age of Onset   Stroke Father     Past Surgical History:  Procedure Laterality Date   ABDOMINAL AORTOGRAM W/LOWER EXTREMITY N/A 02/15/2022   Procedure: ABDOMINAL AORTOGRAM W/LOWER EXTREMITY;  Surgeon: Maeola Harman, MD;  Location: [facility];  Service: Cardiovascular;  Laterality: N/A;   ABDOMINAL HYSTERECTOMY     ANTERIOR FUSION CERVICAL SPINE     x2 -C4-7   APPLICATION OF WOUND VAC Right 03/12/2022   Procedure: APPLICATION OF WOUND VAC;  Surgeon: Nadara Mustard, MD;  [facility];  Service: Orthopedics;  Laterality: Right;   BUNIONECTOMY Bilateral    COLONOSCOPY     CYSTOSCOPY W/ URETEROSCOPY  2012   EYE SURGERY Bilateral    cataract /lens implant   HOLMIUM LASER APPLICATION Left 02/08/2013    Procedure: HOLMIUM LASER APPLICATION;  Surgeon: Excell Seltzer  Annabell Howells, MD;  Location: [facility];  Service: Urology;  Laterality: Left;   I & D EXTREMITY Right 02/10/2022   Procedure: DEBRIDEMENT RIGHT LEG ABSCESS;  Surgeon: Nadara Mustard, MD;  Location: [facility];  Service: Orthopedics;  Laterality: Right;   I & D EXTREMITY Right 03/12/2022   Procedure: RIGHT LEG IRRIGATION AND DEBRIDEMENT EXTREMITY;  Surgeon: Nadara Mustard, MD;  [facility];  Service: Orthopedics;  Laterality: Right;   INSERTION OF MESH N/A 07/15/2014   Procedure: INSERTION OF MESH;  Surgeon: Ardeth Sportsman, MD;  [facility];  Service: General;  Laterality: N/A;   JOINT REPLACEMENT Right 2012   shoulder   LAPAROSCOPIC CHOLECYSTECTOMY W/ CHOLANGIOGRAPHY  2012   Dr Magnus Ivan   NASAL SINUS SURGERY     OPEN REDUCTION INTERNAL FIXATION (ORIF) DISTAL RADIAL FRACTURE Left 03/20/2021   Procedure: OPEN REDUCTION INTERNAL FIXATION (ORIF) DISTAL RADIAL FRACTURE;  Surgeon: Teryl Lucy, MD;  [facility];  Service: Orthopedics;  Laterality: Left;   RADIOLOGY WITH ANESTHESIA N/A 05/09/2014   Procedure: ADULT SEDATION WITH ANESTHESIA/MRI CERVICAL SPINE WITHOUT CONTRAST;  Surgeon: Medication Radiologist, MD;  [facility];  Service: Radiology;  Laterality: N/A;  DR. HAWKS/MRI   right knee arthroscopy     d/t meniscal tear   SHOULDER ARTHROSCOPY W/ ROTATOR CUFF REPAIR Bilateral three times each over several yrs   SKIN FULL THICKNESS GRAFT Right 03/12/2022   Procedure: SKIN GRAFT FULL THICKNESS;  Surgeon: Nadara Mustard, MD;  Location[facility];  Service: Orthopedics;  Laterality: Right;   SKIN SPLIT GRAFT Right 02/17/2022   Procedure: RIGHT LEG SKIN GRAFT;  Surgeon: Nadara Mustard, MD;  Location: [facility] OR;  Service: Orthopedics;  Laterality: Right;   THUMB ARTHROSCOPY Left    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   Procedure: RIGHT TOTAL KNEE ARTHROPLASTY;  Surgeon: Jodi Geralds, MD;  [facility];  Service: Orthopedics;   Laterality: Right;   UMBILICAL HERNIA REPAIR N/A 07/15/2014   Procedure: LAPAROSCOPIC UMBILICAL AND INFRAUMBILICAL HERNIA;  Surgeon: Ardeth Sportsman, MD;  [facility];  Service: General;  Laterality: N/A;   Social History   Occupational History   Not on file  Tobacco Use   Smoking status: Former    Packs/day: 0.50    Years: 2.00    Total pack years: 1.00    Types: Cigarettes    Quit date: 02/07/1992    Years since quitting: 30.2   Smokeless tobacco: Never  Vaping Use   Vaping Use: Never used  Substance and Sexual Activity   Alcohol use: No   Drug use: No   Sexual activity: Not on file

## 2022-05-18 NOTE — Progress Notes (Signed)
Office Visit Note   Patient: Isabel Barnes           Date of Birth: December 15, 1940           MRN: [id number] Visit Date: 05/04/2022              Requested by: Jamal Collin, PA-C [address] [place],  [place] [address] PCP: Jamal Collin, [place]  Chief Complaint  Patient presents with   Right Leg - Routine Post Op    03/12/2022 RLE debridement and Kerecis graft       HPI: Patient is an 81 year old woman who presents in follow-up for ulceration right lower extremity status post Kerecis tissue graft and compression wrap.  Assessment & Plan: Visit Diagnoses:  1. Chronic ulcer of right leg, with fat layer exposed (HCC)     Plan: We will reapply a compression wrap.  Follow-Up Instructions: Return in about 2 weeks (around 05/18/2022).   Ortho Exam  Patient is alert, oriented, no adenopathy, well-dressed, normal affect, normal respiratory effort. Examination the wound bed has healthy granulation tissue with very superficial fibrinous exudative tissue.  Patient has significant swelling proximal to the compression wrap with blisters.  Discussed the importance of elevation.  Imaging: No results found.   Labs: Lab Results  Component Value Date   ESRSEDRATE 40 (H) 02/07/2022   CRP 20.5 (H) 02/07/2022   REPTSTATUS 02/16/2022 FINAL 02/10/2022   GRAMSTAIN  02/10/2022    RARE WBC PRESENT, PREDOMINANTLY PMN NO ORGANISMS SEEN    CULT  02/10/2022    RARE ENTEROCOCCUS FAECALIS RARE BACTEROIDES SPECIES NOT FRAGILIS BETA LACTAMASE POSITIVE Performed at [facility], [address] [place], [place] [address]    [facility] ENTEROCOCCUS FAECALIS 02/10/2022     Lab Results  Component Value Date   ALBUMIN 1.5 (L) 02/09/2022   ALBUMIN 2.1 (L) 02/06/2022   ALBUMIN 3.7 06/25/2017    Lab Results  Component Value Date   MG 2.0 02/12/2022   MG 1.8 02/07/2022   Lab Results  Component Value Date   VD25OH 69.88 02/07/2022    No results found for:  "PREALBUMIN"    Latest Ref Rng & Units 03/16/2022   12:00 AM 03/04/2022   12:00 AM 02/25/2022    1:16 AM  CBC EXTENDED  WBC  6.9     5.7     10.0   RBC 3.87 - 5.11 2.96     3.43     2.98   Hemoglobin 12.0 - 16.0 8.6     9.8     8.2   HCT 36 - 46 26     29     26.7   Platelets 150 - 400 K/uL 272     420     375   NEUT#   3.70     6.3   Lymph# 0.7 - 4.0 K/uL   2.1      This result is from an external source.     There is no height or weight on file to calculate BMI.  Orders:  No orders of the defined types were placed in this encounter.  No orders of the defined types were placed in this encounter.    Procedures: No procedures performed  Clinical Data: No additional findings.  ROS:  All other systems negative, except as noted in the HPI. Review of Systems  Objective: Vital Signs: There were no vitals taken for this visit.  Specialty Comments:  No specialty comments  available.  PMFS History: Patient Active Problem List   Diagnosis Date Noted   Abscess of right lower leg    Severe protein-calorie malnutrition (HCC)    PVD (peripheral vascular disease) (HCC)    Fall    Atrial fibrillation (HCC)    Goals of care, counseling/discussion    Anemia    Fluid collection (edema) in the arms, legs, hands and feet    Cellulitis of right lower extremity 02/06/2022   Chest pain, rule out acute myocardial infarction 03/16/2021   HTN (hypertension) 03/16/2021   Hypothyroidism 03/16/2021   HLD (hyperlipidemia) 03/16/2021   Aortic valve stenosis 03/16/2021   Chronic venous insufficiency of lower extremity 03/16/2021   Short-term memory loss 12/13/2019   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   Primary osteoarthritis of right knee 07/16/2016   Eosinophilia 06/16/2015   Chronic pain disorder    Osteoarthritis    Incisional umbilical hernia, without obstruction or gangrene    Iron deficiency anemia 09/19/2012   Past Medical History:  Diagnosis Date   Anemia    iron  deficiency hx.has had iron infusions before    Chronic low back pain    Chronic pain disorder    Complication of anesthesia    severe claustrophobia   Constipation    r/t use of pain meds.Takes OTC meds or eats prunes   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HCC)    Unsteady gait    occasionally   Urinary urgency     Family History  Problem Relation Age of Onset   Stroke Father     Past Surgical History:  Procedure Laterality Date   ABDOMINAL AORTOGRAM W/LOWER EXTREMITY N/A 02/15/2022   Procedure: ABDOMINAL AORTOGRAM W/LOWER EXTREMITY;  Surgeon: Maeola Harman, MD;  Location: [facility] INVASIVE CV LAB;  Service: Cardiovascular;  Laterality: N/A;   ABDOMINAL HYSTERECTOMY     ANTERIOR FUSION CERVICAL SPINE     x2 -C4-7   APPLICATION OF WOUND VAC Right 03/12/2022   Procedure: APPLICATION OF WOUND VAC;  Surgeon: Nadara Mustard, MD;  [facility];  Service: Orthopedics;  Laterality: Right;   BUNIONECTOMY Bilateral    COLONOSCOPY     CYSTOSCOPY W/ URETEROSCOPY  2012   EYE SURGERY Bilateral    cataract /lens implant   HOLMIUM LASER APPLICATION Left 02/08/2013   Procedure: HOLMIUM LASER APPLICATION;  Surgeon: Anner Crete, MD;  Location: [facility];  Service: Urology;  Laterality: Left;   I & D EXTREMITY Right 02/10/2022   Procedure: DEBRIDEMENT RIGHT LEG ABSCESS;  Surgeon: Newt Minion, MD;  Location: [place];  Service: Orthopedics;  Laterality: Right;   I & D EXTREMITY Right 03/12/2022   Procedure: RIGHT LEG IRRIGATION AND DEBRIDEMENT EXTREMITY;  Surgeon: Newt Minion, MD;  Location: [place];  Service: Orthopedics;  Laterality: Right;   INSERTION OF MESH N/A 07/15/2014   Procedure: INSERTION OF MESH;  Surgeon: Adin Hector, MD;  Location: [place];  Service: General;  Laterality: N/A;   JOINT REPLACEMENT Right 2012   shoulder   LAPAROSCOPIC CHOLECYSTECTOMY W/ CHOLANGIOGRAPHY  2012   Dr Ninfa Linden   NASAL SINUS SURGERY     OPEN REDUCTION INTERNAL FIXATION (ORIF) DISTAL RADIAL FRACTURE Left 03/20/2021   Procedure: OPEN REDUCTION INTERNAL FIXATION (ORIF) DISTAL RADIAL FRACTURE;  Surgeon: Marchia Bond, MD;  Location: [place];  Service: Orthopedics;  Laterality: Left;   RADIOLOGY WITH ANESTHESIA N/A 05/09/2014   Procedure: ADULT SEDATION WITH ANESTHESIA/MRI CERVICAL SPINE WITHOUT CONTRAST;  Surgeon: Medication Radiologist, MD;  Location: [place];  Service: Radiology;  Laterality: N/A;  DR. HAWKS/MRI   right knee arthroscopy     d/t meniscal tear   SHOULDER ARTHROSCOPY W/ ROTATOR CUFF REPAIR Bilateral three times each over several yrs   SKIN FULL THICKNESS GRAFT Right 03/12/2022   Procedure: SKIN GRAFT FULL THICKNESS;  Surgeon: Newt Minion, MD;  Location: [place];  Service: Orthopedics;  Laterality: Right;   SKIN SPLIT GRAFT Right 02/17/2022   Procedure: RIGHT LEG SKIN GRAFT;  Surgeon: Newt Minion, MD;  Location: [place];  Service: Orthopedics;  Laterality: Right;   THUMB ARTHROSCOPY Left    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   Procedure: RIGHT TOTAL KNEE ARTHROPLASTY;  Surgeon: Dorna Leitz, MD;  Location: [place];  Service: Orthopedics;  Laterality: Right;   UMBILICAL HERNIA REPAIR N/A 07/15/2014   Procedure: LAPAROSCOPIC  UMBILICAL AND INFRAUMBILICAL HERNIA;  Surgeon: Adin Hector, MD;  Location: [place];  Service: General;  Laterality: N/A;   Social History   Occupational History   Not on file  Tobacco Use   Smoking status: Former    Packs/day: 0.50    Years: 2.00    Total pack years: 1.00    Types: Cigarettes    Quit date: 02/07/1992    Years since quitting: 30.2   Smokeless tobacco: Never  Vaping Use   Vaping Use: Never used  Substance and Sexual Activity   Alcohol use: No   Drug use: No   Sexual activity: Not on file

## 2022-05-21 ENCOUNTER — Encounter: Payer: Medicare Other | Admitting: Family

## 2022-05-21 ENCOUNTER — Ambulatory Visit: Payer: Medicare Other

## 2022-05-21 DIAGNOSIS — Z9889 Other specified postprocedural states: Secondary | ICD-10-CM

## 2022-05-21 DIAGNOSIS — I872 Venous insufficiency (chronic) (peripheral): Secondary | ICD-10-CM

## 2022-05-25 ENCOUNTER — Ambulatory Visit (INDEPENDENT_AMBULATORY_CARE_PROVIDER_SITE_OTHER): Payer: Medicare Other | Admitting: Family

## 2022-05-25 ENCOUNTER — Ambulatory Visit: Payer: Medicare Other | Admitting: Physician Assistant

## 2022-05-25 DIAGNOSIS — I872 Venous insufficiency (chronic) (peripheral): Secondary | ICD-10-CM | POA: Diagnosis not present

## 2022-05-25 DIAGNOSIS — Z9889 Other specified postprocedural states: Secondary | ICD-10-CM | POA: Diagnosis not present

## 2022-05-25 DIAGNOSIS — L97912 Non-pressure chronic ulcer of unspecified part of right lower leg with fat layer exposed: Secondary | ICD-10-CM

## 2022-05-26 IMAGING — DX DG CHEST 1V PORT
1 series · 1 of 1 positions shown · non-contrast
Comparison: 04/26/2020

CLINICAL DATA: Chest pain

EXAM:
PORTABLE CHEST 1 VIEW

[chest ap]
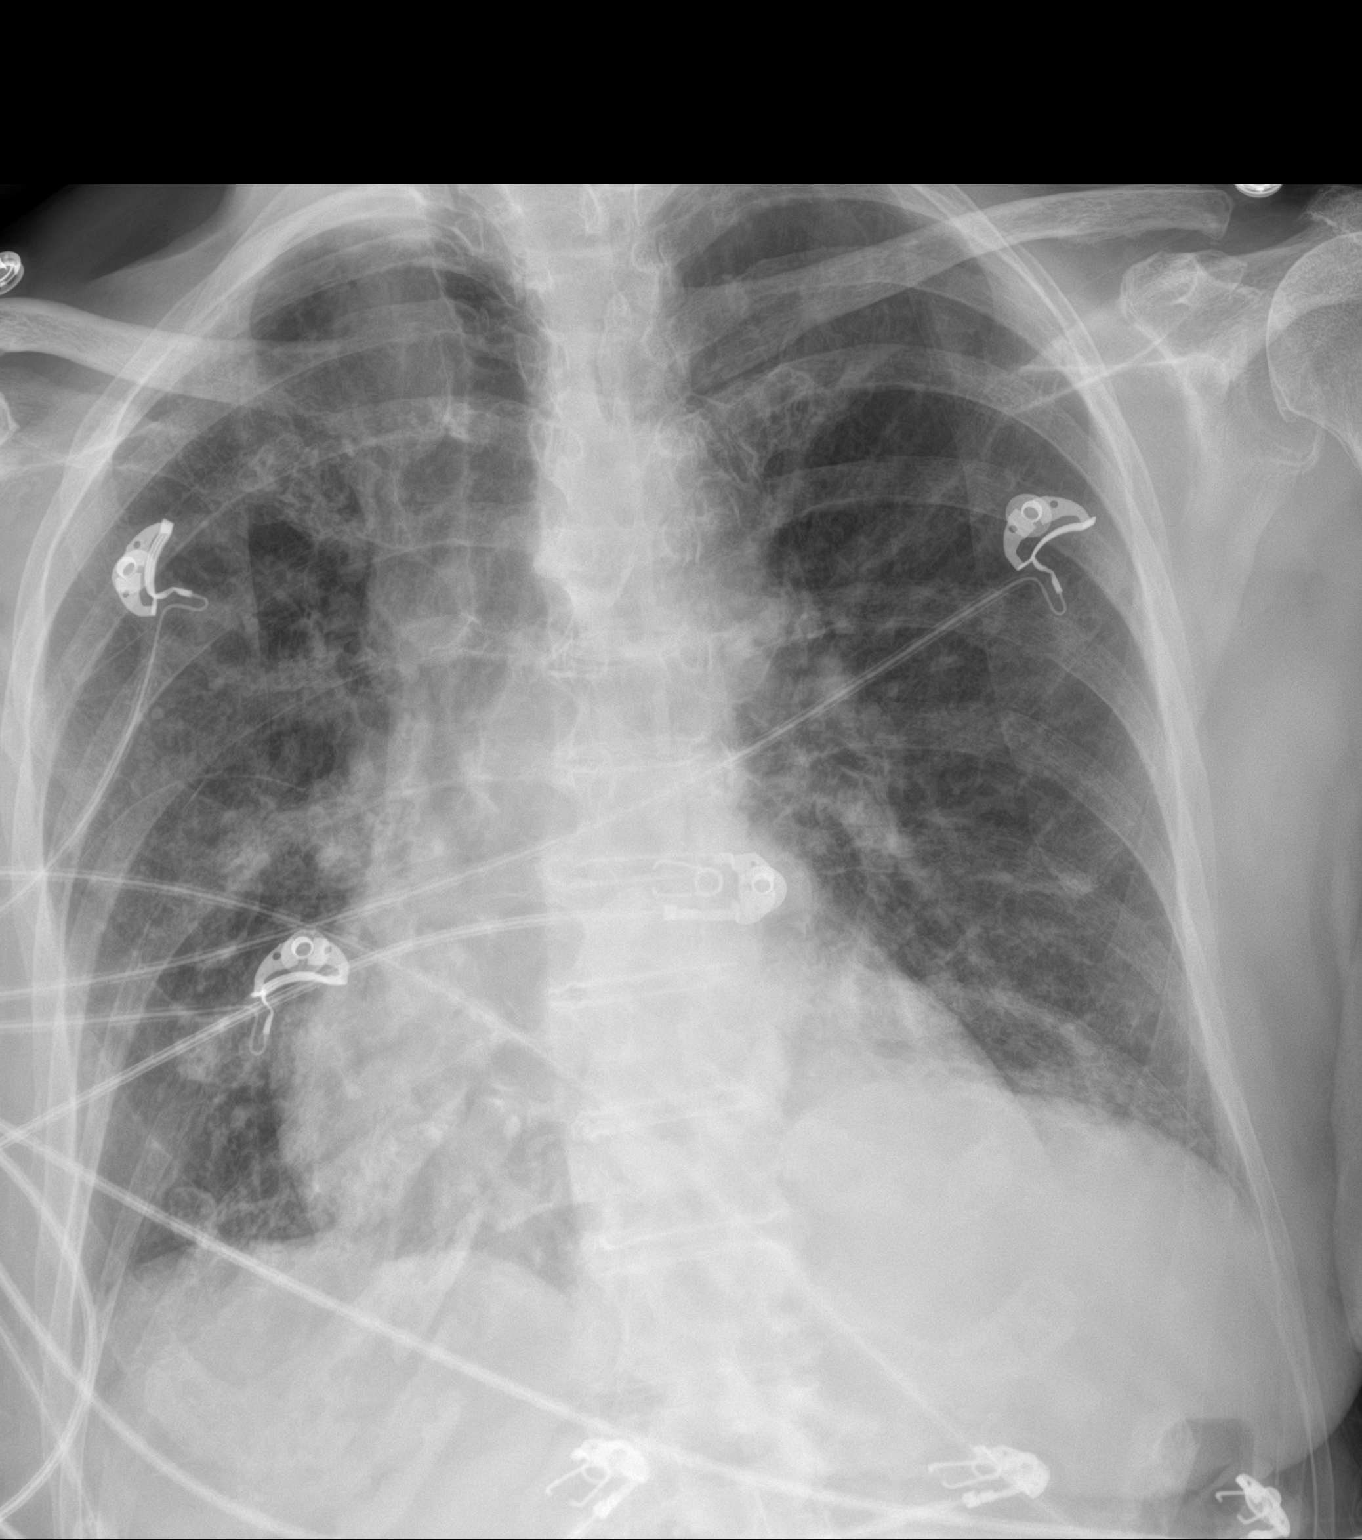

[1 of 1 positions shown; findings below may reference images not displayed]

FINDINGS: Cardiac shadow is enlarged but stable accentuated by the portable
technique. Aortic calcifications are noted. Lungs are well aerated
bilaterally. Mild vascular congestion is seen. No interstitial edema
is noted. No infiltrate is seen. Postsurgical changes in the
cervical spine are noted.
IMPRESSION: Mild vascular congestion without other acute abnormality.

## 2022-05-27 ENCOUNTER — Encounter: Payer: Self-pay | Admitting: Family

## 2022-05-27 NOTE — Progress Notes (Signed)
Office Visit Note   Patient: Isabel Barnes           Date of Birth: [birth year]           MRN: [id number] Visit Date: 05/25/2022              Requested by: Jamal Collin, PA-C [address] [place],  [place] [address] PCP: Jamal Collin, PA-C  Chief Complaint  Patient presents with   Right Leg - Routine Post Op      HPI: Patient is an 81 year old woman who is status post Kerecis tissue graft for chronic ulcer right leg.  Has currently been using dry dressings and an Ace wrap changing this daily has been doing home dressing changes with the assistance of a friend.  She has no concerns today.  Assessment & Plan: Visit Diagnoses:  No diagnosis found.   Plan: Pleased with continued ingrowth.  She will continue with her current wound care daily Dial soap cleansing.  Dry dressings gauze with Ace wrap up to the knee.  She will do this bilaterally.  Follow-Up Instructions: No follow-ups on file.   Ortho Exam  Patient is alert, oriented, no adenopathy, well-dressed, normal affect, normal respiratory effort. Examination patient has much improved granulation tissue in the wound bed as well as decreased swelling in the leg and decreased swelling around the knee.  The wound on the right leg measures 10 x 15 cm there is about a half a centimeter of circumferential epithelialization.  No surrounding erythema dermatitis no worsening of the right lower extremity.  The superficial blisters to the left leg are resolving.  Edema to the left lower extremity controlled.  Imaging: No results found. No images are attached to the encounter.  Labs: Lab Results  Component Value Date   ESRSEDRATE 40 (H) 02/07/2022   CRP 20.5 (H) 02/07/2022   REPTSTATUS 02/16/2022 FINAL 02/10/2022   GRAMSTAIN  02/10/2022    RARE WBC PRESENT, PREDOMINANTLY PMN NO ORGANISMS SEEN    CULT  02/10/2022    RARE ENTEROCOCCUS FAECALIS RARE BACTEROIDES SPECIES NOT FRAGILIS BETA LACTAMASE  POSITIVE Performed at [facility], [address], [place], [place] [address]    [facility] ENTEROCOCCUS FAECALIS 02/10/2022     Lab Results  Component Value Date   ALBUMIN 1.5 (L) 02/09/2022   ALBUMIN 2.1 (L) 02/06/2022   ALBUMIN 3.7 06/25/2017    Lab Results  Component Value Date   MG 2.0 02/12/2022   MG 1.8 02/07/2022   Lab Results  Component Value Date   VD25OH 69.88 02/07/2022    No results found for: "PREALBUMIN"    Latest Ref Rng & Units 03/16/2022   12:00 AM 03/04/2022   12:00 AM 02/25/2022    1:16 AM  CBC EXTENDED  WBC  6.9     5.7     10.0   RBC 3.87 - 5.11 2.96     3.43     2.98   Hemoglobin 12.0 - 16.0 8.6     9.8     8.2   HCT 36 - 46 26     29     26.7   Platelets 150 - 400 K/uL 272     420     375   NEUT#   3.70     6.3   Lymph# 0.7 - 4.0 K/uL   2.1      This result is from an external source.      There  is no height or weight on file to calculate BMI.  Orders:  No orders of the defined types were placed in this encounter.  No orders of the defined types were placed in this encounter.    Procedures: No procedures performed  Clinical Data: No additional findings.  ROS:  All other systems negative, except as noted in the HPI. Review of Systems  Objective: Vital Signs: There were no vitals taken for this visit.  Specialty Comments:  No specialty comments available.  PMFS History: Patient Active Problem List   Diagnosis Date Noted   Abscess of right lower leg    Severe protein-calorie malnutrition ([place])    PVD (peripheral vascular disease) ([place])    Fall    Atrial fibrillation ([place])    Goals of care, counseling/discussion    Anemia    Fluid collection (edema) in the arms, legs, hands and feet    Cellulitis of right lower extremity 02/06/2022   Chest pain, rule out acute myocardial infarction 03/16/2021   HTN (hypertension) 03/16/2021   Hypothyroidism 03/16/2021   HLD (hyperlipidemia) 03/16/2021   Aortic valve stenosis  03/16/2021   Chronic venous insufficiency of lower extremity 03/16/2021   Short-term memory loss 12/13/2019   Psoriatic arthritis ([place]) 12/13/2019   Primary osteoarthritis of right knee 07/16/2016   Eosinophilia 06/16/2015   Chronic pain disorder    Osteoarthritis    Incisional umbilical hernia, without obstruction or gangrene    Iron deficiency anemia 09/19/2012   Past Medical History:  Diagnosis Date   Anemia    iron deficiency hx.has had iron infusions before    Chronic low back pain    Chronic pain disorder    Complication of anesthesia    severe claustrophobia   Constipation    r/t use of pain meds.Takes OTC meds or eats prunes   CVA (cerebral vascular accident) ([place])    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[place])    Unsteady gait    occasionally   Urinary urgency     Family History  Problem Relation Age of Onset   Stroke Father     Past Surgical History:  Procedure Laterality Date   ABDOMINAL AORTOGRAM W/LOWER EXTREMITY N/A 02/15/2022   Procedure: ABDOMINAL AORTOGRAM W/LOWER EXTREMITY;  Surgeon: Waynetta Sandy, MD;  Location: [place] [facility] LAB;  Service: Cardiovascular;  Laterality: N/A;   ABDOMINAL HYSTERECTOMY     ANTERIOR FUSION CERVICAL SPINE     x2 -[id number]    APPLICATION OF WOUND VAC Right 03/12/2022   Procedure: APPLICATION OF WOUND VAC;  Surgeon: Newt Minion, MD;  [facility];  Service: Orthopedics;  Laterality: Right;   BUNIONECTOMY Bilateral    COLONOSCOPY     CYSTOSCOPY W/ URETEROSCOPY  2012   EYE SURGERY Bilateral    cataract /lens implant   HOLMIUM LASER APPLICATION Left 02/08/2013   Procedure: HOLMIUM LASER APPLICATION;  Surgeon: Anner Crete, MD;  Location[facility];  Service: Urology;  Laterality: Left;   I & D EXTREMITY Right 02/10/2022   Procedure: DEBRIDEMENT RIGHT LEG ABSCESS;  Surgeon: Nadara Mustard, MD;  Location: [facility];  Service: Orthopedics;  Laterality: Right;   I & D EXTREMITY Right 03/12/2022   Procedure: RIGHT LEG IRRIGATION AND DEBRIDEMENT EXTREMITY;  Surgeon: Nadara Mustard, MD;  Location: [facility] OR;  Service: Orthopedics;  Laterality: Right;   INSERTION OF MESH N/A 07/15/2014   Procedure: INSERTION OF MESH;  Surgeon: Ardeth Sportsman, MD;  [facility];  Service: General;  Laterality: N/A;   JOINT REPLACEMENT Right 2012   shoulder   LAPAROSCOPIC CHOLECYSTECTOMY W/ CHOLANGIOGRAPHY  2012   Dr Magnus Ivan   NASAL SINUS SURGERY     OPEN REDUCTION INTERNAL FIXATION (ORIF) DISTAL RADIAL FRACTURE Left 03/20/2021   Procedure: OPEN REDUCTION INTERNAL FIXATION (ORIF) DISTAL RADIAL FRACTURE;  Surgeon: Teryl Lucy, MD;  [facility];  Service: Orthopedics;  Laterality: Left;   RADIOLOGY WITH ANESTHESIA N/A 05/09/2014   Procedure: ADULT SEDATION WITH ANESTHESIA/MRI CERVICAL SPINE WITHOUT CONTRAST;  Surgeon: Medication Radiologist, MD;  [facility];  Service: Radiology;  Laterality: N/A;  DR. HAWKS/MRI   right knee arthroscopy     d/t meniscal tear   SHOULDER ARTHROSCOPY W/ ROTATOR CUFF REPAIR Bilateral three times each over several yrs   SKIN FULL THICKNESS GRAFT Right 03/12/2022   Procedure: SKIN GRAFT FULL THICKNESS;  Surgeon: Nadara Mustard, MD;  Location: [facility] OR;  Service: Orthopedics;  Laterality: Right;    SKIN SPLIT GRAFT Right 02/17/2022   Procedure: RIGHT LEG SKIN GRAFT;  Surgeon: Nadara Mustard, MD;  Location: [facility];  Service: Orthopedics;  Laterality: Right;   THUMB ARTHROSCOPY Left    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   Procedure: RIGHT TOTAL KNEE ARTHROPLASTY;  Surgeon: Jodi Geralds, MD;  [facility];  Service: Orthopedics;  Laterality: Right;   UMBILICAL HERNIA REPAIR N/A 07/15/2014   Procedure: LAPAROSCOPIC UMBILICAL AND INFRAUMBILICAL HERNIA;  Surgeon: Ardeth Sportsman, MD;  [facility];  Service: General;  Laterality: N/A;   Social History   Occupational History   Not on file  Tobacco Use   Smoking status: Former    Packs/day: 0.50    Years: 2.00    Total pack years: 1.00    Types: Cigarettes    Quit date: 02/07/1992    Years since quitting: 30.3   Smokeless tobacco: Never  Vaping Use   Vaping Use: Never used  Substance and Sexual Activity   Alcohol use: No   Drug use: No   Sexual activity: Not on file

## 2022-06-09 ENCOUNTER — Encounter: Payer: Self-pay | Admitting: Family

## 2022-06-09 ENCOUNTER — Ambulatory Visit (INDEPENDENT_AMBULATORY_CARE_PROVIDER_SITE_OTHER): Payer: Medicare Other | Admitting: Family

## 2022-06-09 DIAGNOSIS — L97912 Non-pressure chronic ulcer of unspecified part of right lower leg with fat layer exposed: Secondary | ICD-10-CM

## 2022-06-09 DIAGNOSIS — Z9889 Other specified postprocedural states: Secondary | ICD-10-CM

## 2022-06-09 DIAGNOSIS — I872 Venous insufficiency (chronic) (peripheral): Secondary | ICD-10-CM

## 2022-06-09 NOTE — Progress Notes (Signed)
Office Visit Note   Patient: Isabel Barnes           Date of Birth: [birth year]           MRN: [id number] Visit Date: 06/09/2022              Requested by: Jamal Collin, PA-C [address] [place],  [place] [address] PCP: Jamal Collin, PA-C  Chief Complaint  Patient presents with   Right Leg - Routine Post Op      HPI: The patient is an 81 year old woman who presents status post right leg irrigation debridement with Kerecis graft placement on April 14 she has been slow to heal she has been doing daily Dial soap cleansing at home applying gauze and Ace wrap's for bilateral lower extremities she feels her edema has worsened and she states she has recently stopped taking her "fluid pill.  She states this upsets her stomach she has an appointment next week with her primary care to discuss changing that medication  Overall is pleased with current care and improvement in wounds  Assessment & Plan: Visit Diagnoses: No diagnosis found.  Plan: continue with current wound care. Dry dressing and ace. Follow up in 2 weeks. Consider in office kerecis application.  Follow-Up Instructions: Return in about 19 days (around 06/28/2022).   Ortho Exam  Patient is alert, oriented, no adenopathy, well-dressed, normal affect, normal respiratory effort. On examination of bilateral lower extremity she does have pitting edema with mild erythema from chronic edema there is no erythema warmth consistent with cellulitis she has wrinkling of the skin from her Ace wrap compression.  The right lower extremity ulcer is now 15 cm x 10 cm.  This is flat and pink there is no active drainage there is circumferential epithelialization no active drainage no odor  Imaging: No results found. No images are attached to the encounter.  Labs: Lab Results  Component Value Date   ESRSEDRATE 40 (H) 02/07/2022   CRP 20.5 (H) 02/07/2022   REPTSTATUS 02/16/2022 FINAL 02/10/2022   GRAMSTAIN   02/10/2022    RARE WBC PRESENT, PREDOMINANTLY PMN NO ORGANISMS SEEN    CULT  02/10/2022    RARE ENTEROCOCCUS FAECALIS RARE BACTEROIDES SPECIES NOT FRAGILIS BETA LACTAMASE POSITIVE Performed at [facility], [address], [place], [place] [address]    [facility] ENTEROCOCCUS FAECALIS 02/10/2022     Lab Results  Component Value Date   ALBUMIN 1.5 (L) 02/09/2022   ALBUMIN 2.1 (L) 02/06/2022   ALBUMIN 3.7 06/25/2017    Lab Results  Component Value Date   MG 2.0 02/12/2022   MG 1.8 02/07/2022   Lab Results  Component Value Date   VD25OH 69.88 02/07/2022    No results found for: "PREALBUMIN"    Latest Ref Rng & Units 03/16/2022   12:00 AM 03/04/2022   12:00 AM 02/25/2022    1:16 AM  CBC EXTENDED  WBC  6.9     5.7     10.0   RBC 3.87 - 5.11 2.96     3.43     2.98   Hemoglobin 12.0 - 16.0 8.6     9.8     8.2   HCT 36 - 46 26     29     26.7   Platelets 150 - 400 K/uL 272     420     375   NEUT#   3.70     6.3   Lymph#  0.7 - 4.0 K/uL   2.1      This result is from an external source.     There is no height or weight on file to calculate BMI.  Orders:  No orders of the defined types were placed in this encounter.  No orders of the defined types were placed in this encounter.    Procedures: No procedures performed  Clinical Data: No additional findings.  ROS:  All other systems negative, except as noted in the HPI. Review of Systems  Objective: Vital Signs: There were no vitals taken for this visit.  Specialty Comments:  No specialty comments available.  PMFS History: Patient Active Problem List   Diagnosis Date Noted   Abscess of right lower leg    Severe protein-calorie malnutrition (HCC)    PVD (peripheral vascular disease) (HCC)    Fall    Atrial fibrillation (HCC)    Goals of care, counseling/discussion    Anemia    Fluid collection (edema) in the arms, legs, hands and feet    Cellulitis of right lower extremity 02/06/2022   Chest  pain, rule out acute myocardial infarction 03/16/2021   HTN (hypertension) 03/16/2021   Hypothyroidism 03/16/2021   HLD (hyperlipidemia) 03/16/2021   Aortic valve stenosis 03/16/2021   Chronic venous insufficiency of lower extremity 03/16/2021   Short-term memory loss 12/13/2019   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   Primary osteoarthritis of right knee 07/16/2016   Eosinophilia 06/16/2015   Chronic pain disorder    Osteoarthritis    Incisional umbilical hernia, without obstruction or gangrene    Iron deficiency anemia 09/19/2012   Past Medical History:  Diagnosis Date   Anemia    iron deficiency hx.has had iron infusions before    Chronic low back pain    Chronic pain disorder    Complication of anesthesia    severe claustrophobia   Constipation    r/t use of pain meds.Takes OTC meds or eats prunes   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HCC)    Unsteady gait    occasionally   Urinary urgency     Family History  Problem Relation Age of Onset   Stroke Father     Past Surgical History:  Procedure Laterality Date   ABDOMINAL AORTOGRAM W/LOWER EXTREMITY N/A 02/15/2022   Procedure: ABDOMINAL AORTOGRAM W/LOWER EXTREMITY;  Surgeon: Maeola Harman, MD;  Location: [facility] INVASIVE CV LAB;  Service: Cardiovascular;  Laterality: N/A;   ABDOMINAL HYSTERECTOMY     ANTERIOR FUSION CERVICAL SPINE  x2 -C4-7   APPLICATION OF WOUND VAC Right 03/12/2022   Procedure: APPLICATION OF WOUND VAC;  Surgeon: Nadara Mustard, MD;  Location: [facility] OR;  Service: Orthopedics;  Laterality: Right;   BUNIONECTOMY Bilateral    COLONOSCOPY     CYSTOSCOPY W/ URETEROSCOPY  2012   EYE SURGERY Bilateral    cataract /lens implant   HOLMIUM LASER APPLICATION Left 02/08/2013   Procedure: HOLMIUM LASER APPLICATION;  Surgeon: Anner Crete, MD;  Location: [facility];  Service: Urology;  Laterality: Left;   I & D EXTREMITY Right 02/10/2022   Procedure: DEBRIDEMENT RIGHT LEG ABSCESS;  Surgeon: Nadara Mustard, MD;  Location: [facility];  Service: Orthopedics;  Laterality: Right;   I & D EXTREMITY Right 03/12/2022   Procedure: RIGHT LEG IRRIGATION AND DEBRIDEMENT EXTREMITY;  Surgeon: Nadara Mustard, MD;  Location: [facility] OR;  Service: Orthopedics;  Laterality: Right;   INSERTION OF MESH N/A 07/15/2014   Procedure: INSERTION OF MESH;  Surgeon: Ardeth Sportsman, MD;  Location: [facility] OR;  Service: General;  Laterality: N/A;   JOINT REPLACEMENT Right 2012   shoulder   LAPAROSCOPIC CHOLECYSTECTOMY W/ CHOLANGIOGRAPHY  2012   Dr Magnus Ivan   NASAL SINUS SURGERY     OPEN REDUCTION INTERNAL FIXATION (ORIF) DISTAL RADIAL FRACTURE Left 03/20/2021   Procedure: OPEN REDUCTION INTERNAL FIXATION (ORIF) DISTAL RADIAL FRACTURE;  Surgeon: Teryl Lucy, MD;  Location: [facility] OR;  Service: Orthopedics;  Laterality: Left;   RADIOLOGY WITH ANESTHESIA N/A 05/09/2014   Procedure: ADULT SEDATION WITH ANESTHESIA/MRI CERVICAL SPINE WITHOUT CONTRAST;  Surgeon: Medication Radiologist, MD;  Location: [facility] OR;  Service: Radiology;  Laterality: N/A;  DR. HAWKS/MRI   right knee arthroscopy     d/t meniscal tear   SHOULDER ARTHROSCOPY W/ ROTATOR CUFF REPAIR Bilateral three times each over several  yrs   SKIN FULL THICKNESS GRAFT Right 03/12/2022   Procedure: SKIN GRAFT FULL THICKNESS;  Surgeon: Nadara Mustard, MD;  Location: [facility] OR;  Service: Orthopedics;  Laterality: Right;   SKIN SPLIT GRAFT Right 02/17/2022   Procedure: RIGHT LEG SKIN GRAFT;  Surgeon: Nadara Mustard, MD;  Location: [facility] OR;  Service: Orthopedics;  Laterality: Right;   THUMB ARTHROSCOPY Left    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   Procedure: RIGHT TOTAL KNEE ARTHROPLASTY;  Surgeon: Jodi Geralds, MD;  Location: [facility];  Service: Orthopedics;  Laterality: Right;   UMBILICAL HERNIA REPAIR N/A 07/15/2014   Procedure: LAPAROSCOPIC UMBILICAL AND INFRAUMBILICAL HERNIA;  Surgeon: Ardeth Sportsman, MD;  Location: [facility] OR;  Service: General;  Laterality: N/A;   Social History   Occupational History   Not on file  Tobacco Use   Smoking status: Former    Packs/day: 0.50    Years: 2.00    Total pack years: 1.00    Types: Cigarettes    Quit date: 02/07/1992    Years since quitting: 30.3   Smokeless tobacco: Never  Vaping Use   Vaping Use: Never used  Substance and Sexual Activity   Alcohol use: No   Drug use: No   Sexual activity: Not on file

## 2022-06-28 ENCOUNTER — Ambulatory Visit: Payer: Medicare Other | Admitting: Orthopedic Surgery

## 2022-06-28 ENCOUNTER — Encounter: Payer: Self-pay | Admitting: Orthopedic Surgery

## 2022-06-28 DIAGNOSIS — I872 Venous insufficiency (chronic) (peripheral): Secondary | ICD-10-CM | POA: Diagnosis not present

## 2022-06-28 DIAGNOSIS — Z9889 Other specified postprocedural states: Secondary | ICD-10-CM | POA: Diagnosis not present

## 2022-06-28 NOTE — Progress Notes (Signed)
Office Visit Note   Patient: Isabel Barnes           Date of Birth: [birth year]           MRN: [id number] Visit Date: 06/28/2022              Requested by: Jamal Collin, PA-C [address] [place],  [place] [address] PCP: Jamal Collin, [place]  Chief Complaint  Patient presents with   Right Leg - Routine Post Op    03/12/2022 RLE I&D kerecis 7 x 10 sheet and 95 cm sq micro       HPI: Patient is an 81 year old woman who presents in follow-up for venous and lymphatic insufficiency right lower extremity patient is unable to wear compression stocking secondary to the arthritis in her hands.  Patient has a lymphedema pump but she states she has not been consistently using it.  Assessment & Plan: Visit Diagnoses:  1. History of artificial skin graft   2. Venous insufficiency (chronic) (peripheral)     Plan: Recommended elevation.  Use the lymphedema pump twice a day.  Continue with compression wraps.  Discussed that we will need to proceed with repeat tissue graft once the swelling has resolved.  Continue using the home cycling exercise equipment for muscle contracture.  Follow-Up Instructions: Return in about 3 weeks (around 07/19/2022).   Ortho Exam  Patient is alert, oriented, no adenopathy, well-dressed, normal affect, normal respiratory effort. Examination patient has increased swelling with blisters on the right lower extremity the wound bed has healthy granulation tissue with superficial epithelialization around the wound edges.  There is dermatitis no clinical signs of cellulitis.  Imaging: No results found.     Labs: Lab Results  Component Value Date   ESRSEDRATE 40 (H) 02/07/2022   CRP 20.5 (H) 02/07/2022   REPTSTATUS 02/16/2022 FINAL 02/10/2022   GRAMSTAIN  02/10/2022    RARE WBC PRESENT, PREDOMINANTLY PMN NO ORGANISMS SEEN    CULT  02/10/2022    RARE ENTEROCOCCUS FAECALIS RARE BACTEROIDES SPECIES NOT FRAGILIS BETA LACTAMASE  POSITIVE Performed at [facility], [address] [place], [place] [address]    [facility] ENTEROCOCCUS FAECALIS 02/10/2022     Lab Results  Component Value Date   ALBUMIN 1.5 (L) 02/09/2022   ALBUMIN 2.1 (L) 02/06/2022   ALBUMIN 3.7 06/25/2017    Lab Results  Component Value Date   MG 2.0 02/12/2022   MG 1.8 02/07/2022   Lab Results  Component Value Date   VD25OH 69.88 02/07/2022    No results found for: "PREALBUMIN"    Latest Ref Rng & Units 03/16/2022   12:00 AM 03/04/2022   12:00 AM 02/25/2022    1:16 AM  CBC EXTENDED  WBC  6.9     5.7     10.0   RBC 3.87 - 5.11 2.96     3.43     2.98   Hemoglobin 12.0 - 16.0 8.6     9.8     8.2   HCT 36 - 46 26     29     26.7   Platelets 150 - 400 K/uL 272     420     375   NEUT#   3.70     6.3   Lymph# 0.7 - 4.0 K/uL   2.1      This result is from an external source.     There is no height or weight on file to  calculate BMI.  Orders:  No orders of the defined types were placed in this encounter.  No orders of the defined types were placed in this encounter.    Procedures: No procedures performed  Clinical Data: No additional findings.  ROS:  All other systems negative, except as noted in the HPI. Review of Systems  Objective: Vital Signs: There were no vitals taken for this visit.  Specialty Comments:  No specialty comments available.  PMFS History: Patient Active Problem List   Diagnosis Date Noted   Abscess of right lower leg    Severe protein-calorie malnutrition (HCC)    PVD (peripheral vascular disease) (HCC)    Fall    Atrial fibrillation (HCC)    Goals of care, counseling/discussion    Anemia    Fluid collection (edema) in the arms, legs, hands and feet    Cellulitis of right lower extremity 02/06/2022   Chest pain, rule out acute myocardial infarction 03/16/2021   HTN (hypertension) 03/16/2021   Hypothyroidism 03/16/2021   HLD (hyperlipidemia) 03/16/2021   Aortic valve stenosis  03/16/2021   Chronic venous insufficiency of lower extremity 03/16/2021   Short-term memory loss 12/13/2019   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   Primary osteoarthritis of right knee 07/16/2016   Eosinophilia 06/16/2015   Chronic pain disorder    Osteoarthritis    Incisional umbilical hernia, without obstruction or gangrene    Iron deficiency anemia 09/19/2012   Past Medical History:  Diagnosis Date   Anemia    iron deficiency hx.has had iron infusions before    Chronic low back pain    Chronic pain disorder    Complication of anesthesia    severe claustrophobia   Constipation    r/t use of pain meds.Takes OTC meds or eats prunes   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HCC)    Unsteady gait    occasionally   Urinary urgency     Family History  Problem Relation Age of Onset   Stroke Father     Past Surgical History:  Procedure Laterality Date   ABDOMINAL AORTOGRAM W/LOWER EXTREMITY N/A 02/15/2022   Procedure: ABDOMINAL AORTOGRAM W/LOWER EXTREMITY;  Surgeon: Maeola Harman, MD;  Location: [facility] INVASIVE CV LAB;  Service: Cardiovascular;  Laterality: N/A;   ABDOMINAL HYSTERECTOMY     ANTERIOR FUSION CERVICAL SPINE     x2 -C4-7    APPLICATION OF WOUND VAC Right 03/12/2022   Procedure: APPLICATION OF WOUND VAC;  Surgeon: Nadara Mustard, MD;  Location: [facility] OR;  Service: Orthopedics;  Laterality: Right;   BUNIONECTOMY Bilateral    COLONOSCOPY     CYSTOSCOPY W/ URETEROSCOPY  2012   EYE SURGERY Bilateral    cataract /lens implant   HOLMIUM LASER APPLICATION Left 02/08/2013   Procedure: HOLMIUM LASER APPLICATION;  Surgeon: Anner Crete, MD;  Location: [facility];  Service: Urology;  Laterality: Left;   I & D EXTREMITY Right 02/10/2022   Procedure: DEBRIDEMENT RIGHT LEG ABSCESS;  Surgeon: Nadara Mustard, MD;  Location: [facility] OR;  Service: Orthopedics;  Laterality: Right;   I & D EXTREMITY Right 03/12/2022   Procedure: RIGHT LEG IRRIGATION AND DEBRIDEMENT EXTREMITY;  Surgeon: Nadara Mustard, MD;  Location: [facility] OR;  Service: Orthopedics;  Laterality: Right;   INSERTION OF MESH N/A 07/15/2014   Procedure: INSERTION OF MESH;  Surgeon: Ardeth Sportsman, MD;  Location: [facility] OR;  Service: General;  Laterality: N/A;   JOINT REPLACEMENT Right 2012   shoulder   LAPAROSCOPIC CHOLECYSTECTOMY W/ CHOLANGIOGRAPHY  2012   Dr Magnus Ivan   NASAL SINUS SURGERY     OPEN REDUCTION INTERNAL FIXATION (ORIF) DISTAL RADIAL FRACTURE Left 03/20/2021   Procedure: OPEN REDUCTION INTERNAL FIXATION (ORIF) DISTAL RADIAL FRACTURE;  Surgeon: Teryl Lucy, MD;  Location: [facility] OR;  Service: Orthopedics;  Laterality: Left;   RADIOLOGY WITH ANESTHESIA N/A 05/09/2014   Procedure: ADULT SEDATION WITH ANESTHESIA/MRI CERVICAL SPINE WITHOUT CONTRAST;  Surgeon: Medication Radiologist, MD;  Location: [facility] OR;  Service: Radiology;  Laterality: N/A;  DR. HAWKS/MRI   right knee arthroscopy     d/t meniscal tear   SHOULDER ARTHROSCOPY W/ ROTATOR CUFF REPAIR Bilateral three times each over several yrs   SKIN FULL THICKNESS GRAFT Right 03/12/2022   Procedure: SKIN GRAFT FULL THICKNESS;  Surgeon: Nadara Mustard, MD;  Location: [facility] OR;  Service: Orthopedics;  Laterality: Right;    SKIN SPLIT GRAFT Right 02/17/2022   Procedure: RIGHT LEG SKIN GRAFT;  Surgeon: Nadara Mustard, MD;  Location: [facility] OR;  Service: Orthopedics;  Laterality: Right;   THUMB ARTHROSCOPY Left    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   Procedure: RIGHT TOTAL KNEE ARTHROPLASTY;  Surgeon: Jodi Geralds, MD;  Location: [facility];  Service: Orthopedics;  Laterality: Right;   UMBILICAL HERNIA REPAIR N/A 07/15/2014   Procedure: LAPAROSCOPIC UMBILICAL AND INFRAUMBILICAL HERNIA;  Surgeon: Ardeth Sportsman, MD;  Location: [facility] OR;  Service: General;  Laterality: N/A;   Social History   Occupational History   Not on file  Tobacco Use   Smoking status: Former    Packs/day: 0.50    Years: 2.00    Total pack years: 1.00    Types: Cigarettes    Quit date: 02/07/1992    Years since quitting: 30.4   Smokeless tobacco: Never  Vaping Use   Vaping Use: Never used  Substance and Sexual Activity   Alcohol use: No   Drug use: No   Sexual activity: Not on file

## 2022-07-08 ENCOUNTER — Other Ambulatory Visit: Payer: Self-pay | Admitting: Adult Health

## 2022-07-08 DIAGNOSIS — M159 Polyosteoarthritis, unspecified: Secondary | ICD-10-CM

## 2022-07-19 ENCOUNTER — Encounter: Payer: Self-pay | Admitting: Orthopedic Surgery

## 2022-07-19 ENCOUNTER — Ambulatory Visit: Payer: Medicare Other | Admitting: Orthopedic Surgery

## 2022-07-19 DIAGNOSIS — I872 Venous insufficiency (chronic) (peripheral): Secondary | ICD-10-CM | POA: Diagnosis not present

## 2022-07-19 DIAGNOSIS — Z9889 Other specified postprocedural states: Secondary | ICD-10-CM

## 2022-07-19 DIAGNOSIS — L97912 Non-pressure chronic ulcer of unspecified part of right lower leg with fat layer exposed: Secondary | ICD-10-CM | POA: Diagnosis not present

## 2022-07-19 DIAGNOSIS — I890 Lymphedema, not elsewhere classified: Secondary | ICD-10-CM | POA: Diagnosis not present

## 2022-07-19 NOTE — Progress Notes (Signed)
Office Visit Note   Patient: Isabel Barnes           Date of Birth: 1941-08-21           MRN: [id number] Visit Date: 07/19/2022              Requested by: Jamal Collin, PA-C [address] [place],  [place] [address] PCP: Jamal Collin, PA-C  Chief Complaint  Patient presents with   Right Leg - Follow-up    03/12/22 RLE I&D w/ kerecis      HPI: Patient is an 81 year old status post Kerecis tissue graft to the right lower extremity.  Patient is 4 months out from surgery.  Assessment & Plan: Visit Diagnoses:  1. History of artificial skin graft   2. Venous insufficiency (chronic) (peripheral)   3. Chronic ulcer of right leg, with fat layer exposed (HCC)   4. Lymphedema     Plan: Patient will use the lymphedema pumps twice a day work on elevation and exercise and compression.  Discussed that once we have better improvement in the swelling we could proceed with additional Kerecis tissue graft. Return in about 3 weeks (around 08/09/2022).   Ortho Exam  Patient is alert, oriented, no adenopathy, well-dressed, normal affect, normal respiratory effort. Examination patient has dermatitis and swelling of both lower extremities.  The calf measures 44 cm in circumference.  The right calf wound measures 14 x 10 cm medial right calf.  Imaging: No results found.     Labs: Lab Results  Component Value Date   ESRSEDRATE 40 (H) 02/07/2022   CRP 20.5 (H) 02/07/2022   REPTSTATUS 02/16/2022 FINAL 02/10/2022   GRAMSTAIN  02/10/2022    RARE WBC PRESENT, PREDOMINANTLY PMN NO ORGANISMS SEEN    CULT  02/10/2022    RARE ENTEROCOCCUS FAECALIS RARE BACTEROIDES SPECIES NOT FRAGILIS BETA LACTAMASE POSITIVE Performed at [facility], [address] [place], [place] [address]    [facility] ENTEROCOCCUS FAECALIS 02/10/2022     Lab Results  Component Value Date   ALBUMIN 1.5 (L) 02/09/2022   ALBUMIN 2.1 (L) 02/06/2022   ALBUMIN 3.7 06/25/2017    Lab  Results  Component Value Date   MG 2.0 02/12/2022   MG 1.8 02/07/2022   Lab Results  Component Value Date   VD25OH 69.88 02/07/2022    No results found for: "PREALBUMIN"    Latest Ref Rng & Units 03/16/2022   12:00 AM 03/04/2022   12:00 AM 02/25/2022    1:16 AM  CBC EXTENDED  WBC  6.9     5.7     10.0   RBC 3.87 - 5.11 2.96     3.43     2.98   Hemoglobin 12.0 - 16.0 8.6     9.8     8.2   HCT 36 - 46 26     29     26.7   Platelets 150 - 400 K/uL 272     420     375   NEUT#   3.70     6.3   Lymph# 0.7 - 4.0 K/uL   2.1      This result is from an external source.     There is no height or weight on file to calculate BMI.  Orders:  No orders of the defined types were placed in this encounter.  No orders of the defined types were placed in this encounter.    Procedures:  No procedures performed  Clinical Data: No additional findings.  ROS:  All other systems negative, except as noted in the HPI. Review of Systems  Objective: Vital Signs: There were no vitals taken for this visit.  Specialty Comments:  No specialty comments available.  PMFS History: Patient Active Problem List   Diagnosis Date Noted   Abscess of right lower leg    Severe protein-calorie malnutrition (HCC)    PVD (peripheral vascular disease) (HCC)    Fall    Atrial fibrillation (HCC)    Goals of care, counseling/discussion    Anemia    Fluid collection (edema) in the arms, legs, hands and feet    Cellulitis of right lower extremity 02/06/2022   Chest pain, rule out acute myocardial infarction 03/16/2021   HTN (hypertension) 03/16/2021   Hypothyroidism 03/16/2021   HLD (hyperlipidemia) 03/16/2021   Aortic valve stenosis 03/16/2021   Chronic venous insufficiency of lower extremity 03/16/2021   Short-term memory loss 12/13/2019   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   Primary osteoarthritis of right knee 07/16/2016   Eosinophilia 06/16/2015   Chronic pain disorder    Osteoarthritis     Incisional umbilical hernia, without obstruction or gangrene    Iron deficiency anemia 09/19/2012   Past Medical History:  Diagnosis Date   Anemia    iron deficiency hx.has had iron infusions before    Chronic low back pain    Chronic pain disorder    Complication of anesthesia    severe claustrophobia   Constipation    r/t use of pain meds.Takes OTC meds or eats prunes   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HCC)    Unsteady gait    occasionally   Urinary urgency     Family History  Problem Relation Age of Onset   Stroke Father     Past Surgical History:  Procedure Laterality Date   ABDOMINAL AORTOGRAM W/LOWER EXTREMITY N/A 02/15/2022   Procedure: ABDOMINAL AORTOGRAM W/LOWER EXTREMITY;  Surgeon: Maeola Harman, MD;  Location: [facility] INVASIVE CV LAB;  Service: Cardiovascular;  Laterality: N/A;   ABDOMINAL HYSTERECTOMY     ANTERIOR FUSION CERVICAL SPINE     x2 -C4-7   APPLICATION OF WOUND VAC Right 03/12/2022   Procedure: APPLICATION OF WOUND VAC;  Surgeon: Nadara Mustard, MD;  Location: [facility] OR;  Service: Orthopedics;  Laterality: Right;   BUNIONECTOMY Bilateral    COLONOSCOPY     CYSTOSCOPY W/ URETEROSCOPY  2012   EYE SURGERY Bilateral     cataract /lens implant  HOLMIUM LASER APPLICATION Left 02/08/2013   Procedure: HOLMIUM LASER APPLICATION;  Surgeon: Anner Crete, MD;  Location: [facility];  Service: Urology;  Laterality: Left;   I & D EXTREMITY Right 02/10/2022   Procedure: DEBRIDEMENT RIGHT LEG ABSCESS;  Surgeon: Nadara Mustard, MD;  Location: [facility] OR;  Service: Orthopedics;  Laterality: Right;   I & D EXTREMITY Right 03/12/2022   Procedure: RIGHT LEG IRRIGATION AND DEBRIDEMENT EXTREMITY;  Surgeon: Nadara Mustard, MD;  Location: [facility] OR;  Service: Orthopedics;  Laterality: Right;   INSERTION OF MESH N/A 07/15/2014   Procedure: INSERTION OF MESH;  Surgeon: Ardeth Sportsman, MD;  Location: [facility] OR;  Service: General;  Laterality: N/A;   JOINT REPLACEMENT Right 2012   shoulder   LAPAROSCOPIC CHOLECYSTECTOMY W/ CHOLANGIOGRAPHY  2012   Dr Magnus Ivan   NASAL SINUS SURGERY     OPEN REDUCTION INTERNAL FIXATION (ORIF) DISTAL RADIAL FRACTURE Left 03/20/2021   Procedure: OPEN REDUCTION INTERNAL FIXATION (ORIF) DISTAL RADIAL FRACTURE;  Surgeon: Teryl Lucy, MD;  Location: [facility] OR;  Service: Orthopedics;  Laterality: Left;   RADIOLOGY WITH ANESTHESIA N/A 05/09/2014   Procedure: ADULT SEDATION WITH ANESTHESIA/MRI CERVICAL SPINE WITHOUT CONTRAST;  Surgeon: Medication Radiologist, MD;  [facility]: [facility] OR;  Service: Radiology;  Laterality: N/A;  DR. HAWKS/MRI   right knee arthroscopy     d/t meniscal tear   SHOULDER ARTHROSCOPY W/ ROTATOR CUFF REPAIR Bilateral three times each over several yrs   SKIN FULL THICKNESS GRAFT Right 03/12/2022   Procedure: SKIN GRAFT FULL THICKNESS;  Surgeon: Nadara Mustard, MD;  Location: [facility] OR;  Service: Orthopedics;  Laterality: Right;   SKIN SPLIT GRAFT Right 02/17/2022   Procedure: RIGHT LEG SKIN GRAFT;  Surgeon: Nadara Mustard, MD;  Location: [facility] OR;  Service: Orthopedics;  Laterality: Right;   THUMB ARTHROSCOPY Left    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   Procedure: RIGHT TOTAL KNEE ARTHROPLASTY;   Surgeon: Jodi Geralds, MD;  [facility] OR;  Service: Orthopedics;  Laterality: Right;   UMBILICAL HERNIA REPAIR N/A 07/15/2014   Procedure: LAPAROSCOPIC UMBILICAL AND INFRAUMBILICAL HERNIA;  Surgeon: Ardeth Sportsman, MD;  [facility] OR;  Service: General;  Laterality: N/A;   Social History   Occupational History   Not on file  Tobacco Use   Smoking status: Former    Packs/day: 0.50    Years: 2.00    Total pack years: 1.00    Types: Cigarettes    Quit date: 02/07/1992    Years since quitting: 30.4   Smokeless tobacco: Never  Vaping Use   Vaping Use: Never used  Substance and Sexual Activity   Alcohol use: No   Drug use: No   Sexual activity: Not on file

## 2022-08-03 ENCOUNTER — Telehealth: Payer: Self-pay | Admitting: Orthopedic Surgery

## 2022-08-03 NOTE — Telephone Encounter (Signed)
Received call from [place]-caregiver advised patient need Rx for supplies sent to [facility]. (Sponge gauze, Pad Abdn, bandage roll and medical tape).   The number to contact [place] is [phone]

## 2022-08-04 NOTE — Telephone Encounter (Signed)
Message to Isabel Barnes with [facility] to see if another order needs to be placed or if pt is able to contact direct for refill of shipment being that she is already established. Will hold pending advisement.

## 2022-08-05 NOTE — Telephone Encounter (Signed)
Called to advise that updated order has been submitted and accepted and should have the deliverable wound care supplies shipped in the next several days. To call if they have any questions.

## 2022-08-09 ENCOUNTER — Ambulatory Visit: Payer: Medicare Other | Admitting: Orthopedic Surgery

## 2022-08-09 DIAGNOSIS — I890 Lymphedema, not elsewhere classified: Secondary | ICD-10-CM | POA: Diagnosis not present

## 2022-08-09 DIAGNOSIS — L97912 Non-pressure chronic ulcer of unspecified part of right lower leg with fat layer exposed: Secondary | ICD-10-CM

## 2022-08-09 DIAGNOSIS — Z9889 Other specified postprocedural states: Secondary | ICD-10-CM | POA: Diagnosis not present

## 2022-08-09 DIAGNOSIS — I872 Venous insufficiency (chronic) (peripheral): Secondary | ICD-10-CM

## 2022-08-10 ENCOUNTER — Encounter: Payer: Self-pay | Admitting: Orthopedic Surgery

## 2022-08-10 NOTE — Progress Notes (Signed)
Office Visit Note   Patient: Isabel Barnes           Date of Birth: [birth year]           MRN: [id number] Visit Date: 08/09/2022              Requested by: Jamal Collin, PA-C [address] [place],  [place] [address] PCP: Jamal Collin, PA-C  Chief Complaint  Patient presents with   Right Leg - Follow-up    03/12/22 RLE I&D w/ kerecis graft      HPI: Patient is 5 months status post debridement right leg ulcer with application of Kerecis tissue graft.  Patient has been set up for lymphedema pumps she states she is not able to consistently use them twice a day.  Assessment & Plan: Visit Diagnoses:  1. History of artificial skin graft   2. Chronic ulcer of right leg, with fat layer exposed (HCC)   3. Lymphedema   4. Venous insufficiency (chronic) (peripheral)     Plan: Discussed with patient that we need to be more aggressive with treatment.  She needs to use the lymphedema pumps twice a day she needs to elevate her foot needs to work on the calf pump.  Follow-Up Instructions: Return in about 2 weeks (around 08/23/2022).   Ortho Exam  Patient is alert, oriented, no adenopathy, well-dressed, normal affect, normal respiratory effort. Examination patient has increased swelling of both lower extremities with dermatitis.  There is extensive fibrinous exudative tissue covering the wound on the right leg.  Patient has a callus beneath the first metatarsal head of the right foot this was pared without complications.  Patient has pitting edema both lower extremities.  Imaging: No results found.    Labs: Lab Results  Component Value Date   ESRSEDRATE 40 (H) 02/07/2022   CRP 20.5 (H) 02/07/2022   REPTSTATUS 02/16/2022 FINAL 02/10/2022   GRAMSTAIN  02/10/2022    RARE WBC PRESENT, PREDOMINANTLY PMN NO ORGANISMS SEEN    CULT  02/10/2022    RARE ENTEROCOCCUS FAECALIS RARE BACTEROIDES SPECIES NOT FRAGILIS BETA LACTAMASE POSITIVE Performed at [facility], [address] [place], [place] [address]    [facility] ENTEROCOCCUS FAECALIS 02/10/2022     Lab Results  Component Value Date   ALBUMIN 1.5 (L) 02/09/2022   ALBUMIN 2.1 (L) 02/06/2022   ALBUMIN 3.7 06/25/2017    Lab Results  Component Value Date   MG 2.0 02/12/2022   MG 1.8 02/07/2022   Lab Results  Component Value Date   VD25OH 69.88 02/07/2022    No results found for: "PREALBUMIN"    Latest Ref Rng & Units 03/16/2022   12:00 AM 03/04/2022   12:00 AM 02/25/2022    1:16 AM  CBC EXTENDED  WBC  6.9     5.7     10.0   RBC 3.87 - 5.11 2.96     3.43     2.98   Hemoglobin 12.0 - 16.0 8.6     9.8     8.2   HCT 36 - 46 26     29     26.7   Platelets 150 - 400 K/uL 272     420     375   NEUT#   3.70     6.3   Lymph# 0.7 - 4.0 K/uL   2.1      This result is from an external source.     There is  no height or weight on file to calculate BMI.  Orders:  No orders of the defined types were placed in this encounter.  No orders of the defined types were placed in this encounter.    Procedures: No procedures performed  Clinical Data: No additional findings.  ROS:  All other systems negative, except as noted in the HPI. Review of Systems  Objective: Vital Signs: There were no vitals taken for this visit.  Specialty Comments:  No specialty comments available.  PMFS History: Patient Active Problem List   Diagnosis Date Noted   Abscess of right lower leg    Severe protein-calorie malnutrition ([place])    PVD (peripheral vascular disease) ([place])    Fall    Atrial fibrillation ([place])    Goals of care, counseling/discussion    Anemia    Fluid collection (edema) in the arms, legs, hands and feet    Cellulitis of right lower extremity 02/06/2022   Chest pain, rule out acute myocardial infarction 03/16/2021   HTN (hypertension) 03/16/2021   Hypothyroidism 03/16/2021   HLD (hyperlipidemia) 03/16/2021   Aortic valve stenosis 03/16/2021   Chronic venous  insufficiency of lower extremity 03/16/2021   Short-term memory loss 12/13/2019   Psoriatic arthritis ([place]) 12/13/2019   Primary osteoarthritis of right knee 07/16/2016   Eosinophilia 06/16/2015   Chronic pain disorder    Osteoarthritis    Incisional umbilical hernia, without obstruction or gangrene    Iron deficiency anemia 09/19/2012   Past Medical History:  Diagnosis Date   Anemia    iron deficiency hx.has had iron infusions before    Chronic low back pain    Chronic pain disorder    Complication of anesthesia    severe claustrophobia   Constipation    r/t use of pain meds.Takes OTC meds or eats prunes   CVA (cerebral vascular accident) ([place])    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[place])    Unsteady gait    occasionally   Urinary urgency     Family History  Problem Relation Age of Onset   Stroke Father     Past Surgical History:  Procedure Laterality Date   ABDOMINAL AORTOGRAM W/LOWER EXTREMITY N/A 02/15/2022   Procedure: ABDOMINAL AORTOGRAM W/LOWER EXTREMITY;  Surgeon: Waynetta Sandy, MD;  Location: [facility];  Service: Cardiovascular;  Laterality: N/A;   ABDOMINAL HYSTERECTOMY     ANTERIOR FUSION CERVICAL SPINE     x2 -[id number]   APPLICATION OF WOUND VAC Right  03/12/2022   Procedure: APPLICATION OF WOUND VAC;  Surgeon: Newt Minion, MD;  [facility]: [facility] OR;  Service: Orthopedics;  Laterality: Right;   BUNIONECTOMY Bilateral    COLONOSCOPY     CYSTOSCOPY W/ URETEROSCOPY  2012   EYE SURGERY Bilateral    cataract /lens implant   HOLMIUM LASER APPLICATION Left 02/08/2013   Procedure: HOLMIUM LASER APPLICATION;  Surgeon: Anner Crete, MD;  Location: [facility];  Service: Urology;  Laterality: Left;   I & D EXTREMITY Right 02/10/2022   Procedure: DEBRIDEMENT RIGHT LEG ABSCESS;  Surgeon: Nadara Mustard, MD;  Location: [facility] OR;  Service: Orthopedics;  Laterality: Right;   I & D EXTREMITY Right 03/12/2022   Procedure: RIGHT LEG IRRIGATION AND DEBRIDEMENT EXTREMITY;  Surgeon: Nadara Mustard, MD;  Location: [facility] OR;  Service: Orthopedics;  Laterality: Right;   INSERTION OF MESH N/A 07/15/2014   Procedure: INSERTION OF MESH;  Surgeon: Ardeth Sportsman, MD;  [facility] OR;  Service: General;  Laterality: N/A;   JOINT REPLACEMENT Right 2012   shoulder   LAPAROSCOPIC CHOLECYSTECTOMY W/ CHOLANGIOGRAPHY  2012   Dr Magnus Ivan   NASAL SINUS SURGERY     OPEN REDUCTION INTERNAL FIXATION (ORIF) DISTAL RADIAL FRACTURE Left 03/20/2021   Procedure: OPEN REDUCTION INTERNAL FIXATION (ORIF) DISTAL RADIAL FRACTURE;  Surgeon: Teryl Lucy, MD;  [facility];  Service: Orthopedics;  Laterality: Left;   RADIOLOGY WITH ANESTHESIA N/A 05/09/2014   Procedure: ADULT SEDATION WITH ANESTHESIA/MRI CERVICAL SPINE WITHOUT CONTRAST;  Surgeon: Medication Radiologist, MD;  [facility]: [facility];  Service: Radiology;  Laterality: N/A;  DR. HAWKS/MRI   right knee arthroscopy     d/t meniscal tear   SHOULDER ARTHROSCOPY W/ ROTATOR CUFF REPAIR Bilateral three times each over several yrs   SKIN FULL THICKNESS GRAFT Right 03/12/2022   Procedure: SKIN GRAFT FULL THICKNESS;  Surgeon: Nadara Mustard, MD;  Location: [facility] OR;  Service: Orthopedics;  Laterality: Right;   SKIN SPLIT GRAFT Right  02/17/2022   Procedure: RIGHT LEG SKIN GRAFT;  Surgeon: Nadara Mustard, MD;  Location: [facility] OR;  Service: Orthopedics;  Laterality: Right;   THUMB ARTHROSCOPY Left    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   Procedure: RIGHT TOTAL KNEE ARTHROPLASTY;  Surgeon: Jodi Geralds, MD;  [facility];  Service: Orthopedics;  Laterality: Right;   UMBILICAL HERNIA REPAIR N/A 07/15/2014   Procedure: LAPAROSCOPIC UMBILICAL AND INFRAUMBILICAL HERNIA;  Surgeon: Ardeth Sportsman, MD;  Location: [facility] OR;  Service: General;  Laterality: N/A;   Social History   Occupational History   Not on file  Tobacco Use   Smoking status: Former    Packs/day: 0.50    Years: 2.00    Total pack years: 1.00    Types: Cigarettes    Quit date: 02/07/1992    Years since quitting: 30.5   Smokeless tobacco: Never  Vaping Use   Vaping Use: Never used  Substance and Sexual Activity   Alcohol use: No   Drug use: No   Sexual activity: Not on file

## 2022-08-24 ENCOUNTER — Ambulatory Visit: Payer: Medicare Other | Admitting: Family

## 2022-09-01 ENCOUNTER — Ambulatory Visit: Payer: Medicare Other | Admitting: Family

## 2022-09-08 ENCOUNTER — Encounter: Payer: Self-pay | Admitting: Family

## 2022-09-08 ENCOUNTER — Ambulatory Visit: Payer: Medicare Other | Admitting: Family

## 2022-09-08 DIAGNOSIS — Z9889 Other specified postprocedural states: Secondary | ICD-10-CM

## 2022-09-08 DIAGNOSIS — L97912 Non-pressure chronic ulcer of unspecified part of right lower leg with fat layer exposed: Secondary | ICD-10-CM

## 2022-09-08 DIAGNOSIS — B354 Tinea corporis: Secondary | ICD-10-CM | POA: Diagnosis not present

## 2022-09-08 DIAGNOSIS — I890 Lymphedema, not elsewhere classified: Secondary | ICD-10-CM

## 2022-09-08 MED ORDER — TERBINAFINE HCL 250 MG PO TABS
250.0000 mg | ORAL_TABLET | Freq: Every day | ORAL | 0 refills | Status: AC
Start: 2022-09-08 — End: 2022-10-20

## 2022-09-08 NOTE — Progress Notes (Signed)
Office Visit Note   Patient: Isabel Barnes           Date of Birth: 1941-03-17           MRN: [id number] Visit Date: 09/08/2022              Requested by: Jamal Collin, PA-C [address] [place],  [place] [address] PCP: Jamal Collin, PA-C  Chief Complaint  Patient presents with   Right Leg - Follow-up    03/12/22 RLE I&D w/ kerecis graft      HPI: Patient is a 81 year old woman who presents today concerned for worsening edema with redness and itching this has been gradually worsening over the last 1 week  Aide who accompanies the visit reports that she typically bathes her lower extremities and feet with the same basin and is concerned she may be contaminating her legs  Has not had any changes in medications no new foods or skin care supplies  Assessment & Plan: Visit Diagnoses: No diagnosis found.  Plan: We will start her on oral Lamisil placed in a Dynaflex for compression today elevate May use topical antifungals on toes and in and webspaces we will follow-up in 1 week  Follow-Up Instructions: Return in about 6 days (around 09/14/2022).   Ortho Exam  Patient is alert, oriented, no adenopathy, well-dressed, normal affect, normal respiratory effort. On examination bilateral lower extremities there is pitting edema with erythema dermatitis from scratching.  Papulopustular appearance with plaques as well.  There is serous weeping this is from toes up to the tibial tubercles.  The there are well-demarcated margins  Imaging: No results found.    Labs: Lab Results  Component Value Date   ESRSEDRATE 40 (H) 02/07/2022   CRP 20.5 (H) 02/07/2022   REPTSTATUS 02/16/2022 FINAL 02/10/2022   GRAMSTAIN  02/10/2022    RARE WBC PRESENT, PREDOMINANTLY PMN NO ORGANISMS SEEN    CULT  02/10/2022    RARE ENTEROCOCCUS FAECALIS RARE BACTEROIDES SPECIES NOT FRAGILIS BETA LACTAMASE POSITIVE Performed at [facility], [address] [place],  [place] [address]    [facility] ENTEROCOCCUS FAECALIS 02/10/2022     Lab Results  Component Value Date   ALBUMIN 1.5 (L) 02/09/2022   ALBUMIN 2.1 (L) 02/06/2022   ALBUMIN 3.7 06/25/2017    Lab Results  Component Value Date   MG 2.0 02/12/2022   MG 1.8 02/07/2022   Lab Results  Component Value Date   VD25OH 69.88 02/07/2022    No results found for: "PREALBUMIN"    Latest Ref Rng & Units 03/16/2022   12:00 AM 03/04/2022   12:00 AM 02/25/2022    1:16 AM  CBC EXTENDED  WBC  6.9     5.7     10.0   RBC 3.87 - 5.11 2.96     3.43     2.98   Hemoglobin 12.0 - 16.0 8.6     9.8     8.2   HCT 36 - 46 26     29     26.7   Platelets 150 - 400 K/uL 272     420     375   NEUT#   3.70     6.3   Lymph# 0.7 - 4.0 K/uL   2.1      This result is from an external source.     There is no height or weight on file to calculate BMI.  Orders:  No orders of  the defined types were placed in this encounter.  Meds ordered this encounter  Medications   terbinafine (LAMISIL) 250 MG tablet    Sig: Take 1 tablet (250 mg total) by mouth daily.    Dispense:  42 each    Refill:  0     Procedures: No procedures performed  Clinical Data: No additional findings.  ROS:  All other systems negative, except as noted in the HPI. Review of Systems  Objective: Vital Signs: There were no vitals taken for this visit.  Specialty Comments:  No specialty comments available.  PMFS History: Patient Active Problem List   Diagnosis Date Noted   Abscess of right lower leg    Severe protein-calorie malnutrition (HCC)    PVD (peripheral vascular disease) (HCC)    Fall    Atrial fibrillation (HCC)    Goals of care, counseling/discussion    Anemia    Fluid collection (edema) in the arms, legs, hands and feet    Cellulitis of right lower extremity 02/06/2022   Chest pain, rule out acute myocardial infarction 03/16/2021   HTN (hypertension) 03/16/2021   Hypothyroidism 03/16/2021   HLD (hyperlipidemia)  03/16/2021   Aortic valve stenosis 03/16/2021   Chronic venous insufficiency of lower extremity 03/16/2021   Short-term memory loss 12/13/2019   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   Primary osteoarthritis of right knee 07/16/2016   Eosinophilia 06/16/2015   Chronic pain disorder    Osteoarthritis    Incisional umbilical hernia, without obstruction or gangrene    Iron deficiency anemia 09/19/2012   Past Medical History:  Diagnosis Date   Anemia    iron deficiency hx.has had iron infusions before    Chronic low back pain    Chronic pain disorder    Complication of anesthesia    severe claustrophobia   Constipation    r/t use of pain meds.Takes OTC meds or eats prunes   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HCC)    Unsteady gait    occasionally   Urinary urgency     Family History  Problem Relation Age of Onset   Stroke Father     Past Surgical History:  Procedure Laterality Date   ABDOMINAL AORTOGRAM W/LOWER EXTREMITY N/A 02/15/2022   Procedure: ABDOMINAL AORTOGRAM W/LOWER EXTREMITY;  Surgeon: Maeola Harman, MD;  Location: [facility] INVASIVE CV LAB;  Service: Cardiovascular;  Laterality: N/A;   ABDOMINAL HYSTERECTOMY     ANTERIOR FUSION  CERVICAL SPINE     x2 -C4-7   APPLICATION OF WOUND VAC Right 03/12/2022  Procedure: APPLICATION OF WOUND VAC;  Surgeon: Newt Minion, MD;  Location: [place];  Service: Orthopedics;  Laterality: Right;   BUNIONECTOMY Bilateral    COLONOSCOPY     CYSTOSCOPY W/ URETEROSCOPY  2012   EYE SURGERY Bilateral    cataract /lens implant   HOLMIUM LASER APPLICATION Left 3/81/8299   Procedure: HOLMIUM LASER APPLICATION;  Surgeon: Malka So, MD;  Location: [facility];  Service: Urology;  Laterality: Left;   I & D EXTREMITY Right 02/10/2022   Procedure: DEBRIDEMENT RIGHT LEG ABSCESS;  Surgeon: Newt Minion, MD;  Location: [place];  Service: Orthopedics;  Laterality: Right;   I & D EXTREMITY Right 03/12/2022   Procedure: RIGHT LEG IRRIGATION AND DEBRIDEMENT EXTREMITY;  Surgeon: Newt Minion, MD;  Location: [place];  Service: Orthopedics;  Laterality: Right;   INSERTION OF MESH N/A 07/15/2014   Procedure: INSERTION OF MESH;  Surgeon: Adin Hector, MD;  Location: [place];  Service: General;  Laterality: N/A;   JOINT REPLACEMENT Right 2012   shoulder   LAPAROSCOPIC CHOLECYSTECTOMY W/ CHOLANGIOGRAPHY  2012   Dr Ninfa Linden   NASAL SINUS SURGERY     OPEN REDUCTION INTERNAL FIXATION (ORIF) DISTAL RADIAL FRACTURE Left 03/20/2021   Procedure: OPEN REDUCTION INTERNAL FIXATION (ORIF) DISTAL RADIAL FRACTURE;  Surgeon: Marchia Bond, MD;  Location: [place];  Service: Orthopedics;  Laterality: Left;   RADIOLOGY WITH ANESTHESIA N/A 05/09/2014   Procedure: ADULT SEDATION WITH ANESTHESIA/MRI CERVICAL SPINE WITHOUT CONTRAST;  Surgeon: Medication Radiologist, MD;  Location: [place];  Service: Radiology;  Laterality: N/A;  DR. HAWKS/MRI   right knee arthroscopy     d/t meniscal tear   SHOULDER ARTHROSCOPY W/ ROTATOR CUFF REPAIR Bilateral three times each over several yrs   SKIN FULL THICKNESS GRAFT Right 03/12/2022   Procedure: SKIN GRAFT FULL THICKNESS;  Surgeon: Newt Minion, MD;  Location: [place];  Service:  Orthopedics;  Laterality: Right;   SKIN SPLIT GRAFT Right 02/17/2022   Procedure: RIGHT LEG SKIN GRAFT;  Surgeon: Newt Minion, MD;  Location: [place];  Service: Orthopedics;  Laterality: Right;   THUMB ARTHROSCOPY Left    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   Procedure: RIGHT TOTAL KNEE ARTHROPLASTY;  Surgeon: Dorna Leitz, MD;  Location: [place];  Service: Orthopedics;  Laterality: Right;   UMBILICAL HERNIA REPAIR N/A 07/15/2014   Procedure: LAPAROSCOPIC UMBILICAL AND INFRAUMBILICAL HERNIA;  Surgeon: Adin Hector, MD;  Location: [place];  Service: General;  Laterality: N/A;   Social History   Occupational History   Not on file  Tobacco Use   Smoking status: Former    Packs/day: 0.50    Years: 2.00    Total pack years: 1.00    Types: Cigarettes    Quit date: 02/07/1992    Years since quitting: 30.6   Smokeless tobacco: Never  Vaping Use   Vaping Use: Never used  Substance and Sexual Activity   Alcohol use: No   Drug use: No   Sexual activity: Not on file

## 2022-09-13 ENCOUNTER — Ambulatory Visit: Payer: Medicare Other | Admitting: Orthopedic Surgery

## 2022-09-20 ENCOUNTER — Ambulatory Visit: Payer: Medicare Other | Admitting: Orthopedic Surgery

## 2022-09-27 ENCOUNTER — Ambulatory Visit: Payer: Medicare Other | Admitting: Orthopedic Surgery

## 2022-09-27 ENCOUNTER — Encounter: Payer: Self-pay | Admitting: Orthopedic Surgery

## 2022-09-27 DIAGNOSIS — L97912 Non-pressure chronic ulcer of unspecified part of right lower leg with fat layer exposed: Secondary | ICD-10-CM | POA: Diagnosis not present

## 2022-09-27 DIAGNOSIS — I890 Lymphedema, not elsewhere classified: Secondary | ICD-10-CM

## 2022-09-27 DIAGNOSIS — Z9889 Other specified postprocedural states: Secondary | ICD-10-CM | POA: Diagnosis not present

## 2022-09-27 DIAGNOSIS — I872 Venous insufficiency (chronic) (peripheral): Secondary | ICD-10-CM

## 2022-09-27 NOTE — Progress Notes (Signed)
Office Visit Note   Patient: Isabel Barnes           Date of Birth: Mar 30, 1941           MRN: [id number] Visit Date: 09/27/2022              Requested by: Ardith Dark, PA-C [address] [place],  [place] [address] PCP: Ardith Dark, [place]  Chief Complaint  Patient presents with   Right Leg - Edema, Wound Check, Follow-up    03/12/22 RLE I&D w/ kerecis graft   Left Leg - Edema, Follow-up      HPI: Patient is an 81 year old woman who is seen in follow-up for bilateral venous and lymphatic insufficiency of both legs with a massive wound on the right lower extremity.  Patient states she left the compression wraps on for a day or 2 and then took them off.  She has been using gauze and a loosely wrapped Ace wrap.  Assessment & Plan: Visit Diagnoses:  1. Chronic ulcer of right leg, with fat layer exposed ([place])   2. History of artificial skin graft   3. Lymphedema   4. Venous insufficiency (chronic) (peripheral)     Plan: Discussed the importance of proper wound care.  Discussed that she must keep her feet elevated level or above her heart at all times when seated or laying down.  Discussed the importance of maintaining the compression wrap that we applied.  Risks of infection and potential amputation with noncompliance was discussed.  Patient states she understands she will follow-up on Monday for repeat compression wraps.  Follow-Up Instructions: No follow-ups on file.   Ortho Exam  Patient is alert, oriented, no adenopathy, well-dressed, normal affect, normal respiratory effort. Examination the compression wraps were removed.  Patient states she has not been elevating her feet.  Examination she has massive venous and lymphatic swelling of both legs.  There is a large ulcer over the medial right leg without interval healing.  There is dermatitis with weeping edema both legs.  Imaging: No results found. No images are attached to the  encounter.  Labs: Lab Results  Component Value Date   ESRSEDRATE 40 (H) 02/07/2022   CRP 20.5 (H) 02/07/2022   REPTSTATUS 02/16/2022 FINAL 02/10/2022   GRAMSTAIN  02/10/2022    RARE WBC PRESENT, PREDOMINANTLY PMN NO ORGANISMS SEEN    CULT  02/10/2022    RARE ENTEROCOCCUS FAECALIS RARE BACTEROIDES SPECIES NOT FRAGILIS BETA LACTAMASE POSITIVE Performed at [facility], [place] [address] [place], [place] [address]    [facility] ENTEROCOCCUS FAECALIS 02/10/2022     Lab Results  Component Value Date   ALBUMIN 1.5 (L) 02/09/2022   ALBUMIN 2.1 (L) 02/06/2022   ALBUMIN 3.7 06/25/2017    Lab Results  Component Value Date   MG 2.0 02/12/2022   MG 1.8 02/07/2022   Lab Results  Component Value Date   VD25OH 69.88 02/07/2022    No results found for: "PREALBUMIN"    Latest Ref Rng & Units 03/16/2022   12:00 AM 03/04/2022   12:00 AM 02/25/2022    1:16 AM  CBC EXTENDED  WBC  6.9     5.7     10.0   RBC 3.87 - 5.11 2.96     3.43     2.98   Hemoglobin 12.0 - 16.0 8.6     9.8     8.2   HCT 36 - 46 26     29  26.7   Platelets 150 - 400 K/uL 272     420     375   NEUT#   3.70     6.3   Lymph# 0.7 - 4.0 K/uL   2.1      This result is from an external source.     There is no height or weight on file to calculate BMI.  Orders:  No orders of the defined types were placed in this encounter.  No orders of the defined types were placed in this encounter.    Procedures: No procedures performed  Clinical Data: No additional findings.  ROS:  All other systems negative, except as noted in the HPI. Review of Systems  Objective: Vital Signs: There were no vitals taken for this visit.  Specialty Comments:  No specialty comments available.  PMFS History: Patient Active Problem List   Diagnosis Date Noted   Abscess of right lower leg    Severe protein-calorie malnutrition (HCC)    PVD (peripheral vascular disease) (HCC)    Fall    Atrial fibrillation (HCC)     Goals of care, counseling/discussion    Anemia    Fluid collection (edema) in the arms, legs, hands and feet    Cellulitis of right lower extremity 02/06/2022   Chest pain, rule out acute myocardial infarction 03/16/2021   HTN (hypertension) 03/16/2021   Hypothyroidism 03/16/2021   HLD (hyperlipidemia) 03/16/2021   Aortic valve stenosis 03/16/2021   Chronic venous insufficiency of lower extremity 03/16/2021   Short-term memory loss 12/13/2019   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   Primary osteoarthritis of right knee 07/16/2016   Eosinophilia 06/16/2015   Chronic pain disorder    Osteoarthritis    Incisional umbilical hernia, without obstruction or gangrene    Iron deficiency anemia 09/19/2012   Past Medical History:  Diagnosis Date   Anemia    iron deficiency hx.has had iron infusions before    Chronic low back pain    Chronic pain disorder    Complication of anesthesia    severe claustrophobia   Constipation    r/t use of pain meds.Takes OTC meds or eats prunes   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HCC)    Unsteady gait    occasionally   Urinary urgency     Family History  Problem Relation Age of Onset   Stroke Father     Past  Surgical History:  Procedure Laterality Date   ABDOMINAL AORTOGRAM W/LOWER EXTREMITY N/A 02/15/2022   Procedure: ABDOMINAL AORTOGRAM W/LOWER EXTREMITY;  Surgeon: Maeola Harman, MD;  Location: [facility] INVASIVE CV LAB;  Service: Cardiovascular;  Laterality: N/A;   ABDOMINAL HYSTERECTOMY     ANTERIOR FUSION CERVICAL SPINE     x2 -C4-7   APPLICATION OF WOUND VAC Right 03/12/2022   Procedure: APPLICATION OF WOUND VAC;  Surgeon: Nadara Mustard, MD;  Location: [facility];  Service: Orthopedics;  Laterality: Right;   BUNIONECTOMY Bilateral    COLONOSCOPY     CYSTOSCOPY W/ URETEROSCOPY  2012   EYE SURGERY Bilateral    cataract /lens implant   HOLMIUM LASER APPLICATION Left 02/08/2013   Procedure: HOLMIUM LASER APPLICATION;  Surgeon: Anner Crete, MD;  Location: [facility];  Service: Urology;  Laterality: Left;   I & D EXTREMITY Right 02/10/2022   Procedure: DEBRIDEMENT RIGHT LEG ABSCESS;  Surgeon: Nadara Mustard, MD;  Location: [facility];  Service: Orthopedics;  Laterality: Right;   I & D EXTREMITY Right 03/12/2022   Procedure: RIGHT LEG IRRIGATION AND DEBRIDEMENT EXTREMITY;  Surgeon: Nadara Mustard, MD;  Location: [facility];  Service: Orthopedics;  Laterality: Right;   INSERTION OF MESH N/A 07/15/2014   Procedure: INSERTION OF MESH;  Surgeon: Ardeth Sportsman, MD;  Location[facility];  Service: General;  Laterality: N/A;   JOINT REPLACEMENT Right 2012   shoulder   LAPAROSCOPIC CHOLECYSTECTOMY W/ CHOLANGIOGRAPHY  2012   Dr Magnus Ivan   NASAL SINUS SURGERY     OPEN REDUCTION INTERNAL FIXATION (ORIF) DISTAL RADIAL FRACTURE Left 03/20/2021   Procedure: OPEN REDUCTION INTERNAL FIXATION (ORIF) DISTAL RADIAL FRACTURE;  Surgeon: Teryl Lucy, MD;  Location[facility];  Service: Orthopedics;  Laterality: Left;   RADIOLOGY WITH ANESTHESIA N/A 05/09/2014   Procedure: ADULT SEDATION WITH ANESTHESIA/MRI CERVICAL SPINE WITHOUT CONTRAST;  Surgeon: Medication Radiologist, MD;  [facility];  Service: Radiology;   Laterality: N/A;  DR. HAWKS/MRI   right knee arthroscopy     d/t meniscal tear   SHOULDER ARTHROSCOPY W/ ROTATOR CUFF REPAIR Bilateral three times each over several yrs   SKIN FULL THICKNESS GRAFT Right 03/12/2022   Procedure: SKIN GRAFT FULL THICKNESS;  Surgeon: Nadara Mustard, MD;  Location: [facility] OR;  Service: Orthopedics;  Laterality: Right;   SKIN SPLIT GRAFT Right 02/17/2022   Procedure: RIGHT LEG SKIN GRAFT;  Surgeon: Nadara Mustard, MD;  Location: [facility] OR;  Service: Orthopedics;  Laterality: Right;   THUMB ARTHROSCOPY Left    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   Procedure: RIGHT TOTAL KNEE ARTHROPLASTY;  Surgeon: Jodi Geralds, MD;  [facility];  Service: Orthopedics;  Laterality: Right;   UMBILICAL HERNIA REPAIR N/A 07/15/2014   Procedure: LAPAROSCOPIC UMBILICAL AND INFRAUMBILICAL HERNIA;  Surgeon: Ardeth Sportsman, MD;  Location[facility];  Service: General;  Laterality: N/A;   Social History   Occupational History   Not on file  Tobacco Use   Smoking status: Former    Packs/day: 0.50    Years: 2.00    Total pack years: 1.00    Types: Cigarettes    Quit date: 02/07/1992    Years since quitting: 30.6   Smokeless tobacco: Never  Vaping Use   Vaping Use: Never used  Substance and Sexual Activity   Alcohol use: No   Drug use: No   Sexual activity: Not on file

## 2022-10-04 ENCOUNTER — Ambulatory Visit: Payer: Medicare Other | Admitting: Orthopedic Surgery

## 2022-10-12 ENCOUNTER — Ambulatory Visit: Payer: Medicare Other | Admitting: Orthopedic Surgery

## 2022-10-12 DIAGNOSIS — I87332 Chronic venous hypertension (idiopathic) with ulcer and inflammation of left lower extremity: Secondary | ICD-10-CM | POA: Diagnosis not present

## 2022-10-12 DIAGNOSIS — L97912 Non-pressure chronic ulcer of unspecified part of right lower leg with fat layer exposed: Secondary | ICD-10-CM

## 2022-10-14 ENCOUNTER — Encounter: Payer: Self-pay | Admitting: Orthopedic Surgery

## 2022-10-14 NOTE — Progress Notes (Signed)
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  Patient is status post endovascular intervention in March.  Patient has undergone 3 layer compression wraps in the office however patient has been wrapping her legs on her own with an Ace wrap.  She has been using a lymphedema pump 1 hour a day.  Assessment & Plan: Visit Diagnoses:  1. 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 Chronic ulcer of right leg, with fat layer exposed (HCC)     Plan: Discussed that we need to proceed with better compression wraps and increase her lymphedema pump to twice a day as well as elevation and exercise.  We will set up home health nursing compression wraps 2 times a week.  Follow-Up Instructions: Return in about 2 weeks (around 10/26/2022).   Ortho Exam  Patient is alert, oriented, no adenopathy, well-dressed, normal affect, normal respiratory effort. Examination of both legs patient has increased swelling and increased blistering worse on the left leg with the large ulcer on the right leg unchanged.  There are multiple weeping blisters on the left leg.  Patient is also tender to palpation on the ribs on the left status post a fall there is no bruising she has no shortness of breath.  Imaging: No results found. No images are attached to the encounter.  Labs: Lab Results  Component Value Date   ESRSEDRATE 40 (H) 02/07/2022   CRP 20.5 (H) 02/07/2022   REPTSTATUS 02/16/2022 FINAL 02/10/2022   GRAMSTAIN  02/10/2022    RARE WBC PRESENT,  PREDOMINANTLY PMN NO ORGANISMS SEEN    CULT  02/10/2022    RARE ENTEROCOCCUS FAECALIS RARE BACTEROIDES SPECIES NOT FRAGILIS BETA LACTAMASE POSITIVE Performed at [facility], [address], [place], [place] [address]    [facility] ENTEROCOCCUS FAECALIS 02/10/2022     Lab Results  Component Value Date   ALBUMIN 1.5 (L) 02/09/2022   ALBUMIN 2.1 (L) 02/06/2022   ALBUMIN 3.7 06/25/2017    Lab Results  Component Value Date   MG 2.0 02/12/2022   MG 1.8 02/07/2022   Lab Results  Component Value Date   VD25OH 69.88 02/07/2022    No results found for: "PREALBUMIN"    Latest Ref Rng & Units 03/16/2022   12:00 AM 03/04/2022   12:00 AM 02/25/2022    1:16 AM  CBC EXTENDED  WBC  6.9     5.7     10.0   RBC 3.87 - 5.11 2.96     3.43     2.98   Hemoglobin 12.0 - 16.0 8.6     9.8     8.2   HCT 36 - 46 26     29     26.7   Platelets 150 - 400 K/uL 272     420     375   NEUT#  3.70     6.3   Lymph# 0.7 - 4.0 K/uL   2.1      This result is from an external source.     There is no height or weight on file to calculate BMI.  Orders:  No orders of the defined types were placed in this encounter.  No orders of the defined types were placed in this encounter.    Procedures: No procedures performed  Clinical Data: No additional findings.  ROS:  All other systems negative, except as noted in the HPI. Review of Systems  Objective: Vital Signs: There were no vitals taken for this visit.  Specialty Comments:  No specialty comments available.  PMFS History: Patient Active Problem List   Diagnosis Date Noted   Abscess of right lower leg    Severe protein-calorie malnutrition (HCC)    PVD (peripheral vascular disease) (HCC)    Fall    Atrial fibrillation (HCC)    Goals of care, counseling/discussion    Anemia    Fluid collection (edema) in the arms, legs, hands and feet    Cellulitis of right lower extremity 02/06/2022   Chest pain, rule out acute myocardial  infarction 03/16/2021   HTN (hypertension) 03/16/2021   Hypothyroidism 03/16/2021   HLD (hyperlipidemia) 03/16/2021   Aortic valve stenosis 03/16/2021   Chronic venous insufficiency of lower extremity 03/16/2021   Short-term memory loss 12/13/2019   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   Primary osteoarthritis of right knee 07/16/2016   Eosinophilia 06/16/2015   Chronic pain disorder    Osteoarthritis    Incisional umbilical hernia, without obstruction or gangrene    Iron deficiency anemia 09/19/2012   Past Medical History:  Diagnosis Date   Anemia    iron deficiency hx.has had iron infusions before    Chronic low back pain    Chronic pain disorder    Complication of anesthesia    severe claustrophobia   Constipation    r/t use of pain meds.Takes OTC meds or eats prunes   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HCC)    Unsteady gait    occasionally   Urinary urgency     Family History  Problem Relation Age of Onset   Stroke Father     Past Surgical History:  Procedure Laterality Date   ABDOMINAL AORTOGRAM W/LOWER EXTREMITY N/A 02/15/2022   Procedure: ABDOMINAL AORTOGRAM W/LOWER EXTREMITY;  Surgeon: Maeola Harman, MD;  Location: [facility]  INVASIVE CV LAB;  Service: Cardiovascular;  Laterality: N/A;   ABDOMINAL HYSTERECTOMY  ANTERIOR FUSION CERVICAL SPINE     x2 -C4-7   APPLICATION OF WOUND VAC Right 03/12/2022   Procedure: APPLICATION OF WOUND VAC;  Surgeon: Nadara Mustard, MD;  Location: [facility] OR;  Service: Orthopedics;  Laterality: Right;   BUNIONECTOMY Bilateral    COLONOSCOPY     CYSTOSCOPY W/ URETEROSCOPY  2012   EYE SURGERY Bilateral    cataract /lens implant   HOLMIUM LASER APPLICATION Left 02/08/2013   Procedure: HOLMIUM LASER APPLICATION;  Surgeon: Anner Crete, MD;  Location: [facility];  Service: Urology;  Laterality: Left;   I & D EXTREMITY Right 02/10/2022   Procedure: DEBRIDEMENT RIGHT LEG ABSCESS;  Surgeon: Nadara Mustard, MD;  Location: [facility] OR;  Service: Orthopedics;  Laterality: Right;   I & D EXTREMITY Right 03/12/2022   Procedure: RIGHT LEG IRRIGATION AND DEBRIDEMENT EXTREMITY;  Surgeon: Nadara Mustard, MD;  Location: [facility] OR;  Service: Orthopedics;  Laterality: Right;   INSERTION OF MESH N/A 07/15/2014   Procedure: INSERTION OF MESH;  Surgeon: Ardeth Sportsman, MD;  Location: [facility] OR;  Service: General;  Laterality: N/A;   JOINT REPLACEMENT Right 2012   shoulder   LAPAROSCOPIC CHOLECYSTECTOMY W/ CHOLANGIOGRAPHY  2012   Dr Magnus Ivan   NASAL SINUS SURGERY     OPEN REDUCTION INTERNAL FIXATION (ORIF) DISTAL RADIAL FRACTURE Left 03/20/2021   Procedure: OPEN REDUCTION INTERNAL FIXATION (ORIF) DISTAL RADIAL FRACTURE;  Surgeon: Teryl Lucy, MD;  Location: [facility] OR;  Service: Orthopedics;  Laterality: Left;   RADIOLOGY WITH ANESTHESIA N/A 05/09/2014   Procedure: ADULT SEDATION WITH ANESTHESIA/MRI CERVICAL SPINE WITHOUT CONTRAST;  Surgeon: Medication Radiologist, MD;  Location: [facility] OR;  Service: Radiology;  Laterality: N/A;  DR. HAWKS/MRI   right knee arthroscopy     d/t meniscal tear   SHOULDER ARTHROSCOPY W/ ROTATOR CUFF REPAIR Bilateral three times each over several yrs   SKIN FULL THICKNESS GRAFT Right  03/12/2022   Procedure: SKIN GRAFT FULL THICKNESS;  Surgeon: Nadara Mustard, MD;  Location: [facility] OR;  Service: Orthopedics;  Laterality: Right;   SKIN SPLIT GRAFT Right 02/17/2022   Procedure: RIGHT LEG SKIN GRAFT;  Surgeon: Nadara Mustard, MD;  Location: [facility] OR;  Service: Orthopedics;  Laterality: Right;   THUMB ARTHROSCOPY Left    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   Procedure: RIGHT TOTAL KNEE ARTHROPLASTY;  Surgeon: Jodi Geralds, MD;  Location: [facility] OR;  Service: Orthopedics;  Laterality: Right;   UMBILICAL HERNIA REPAIR N/A 07/15/2014   Procedure: LAPAROSCOPIC UMBILICAL AND INFRAUMBILICAL HERNIA;  Surgeon: Ardeth Sportsman, MD;  Location: [facility] OR;  Service: General;  Laterality: N/A;   Social History   Occupational History   Not on file  Tobacco Use   Smoking status: Former    Packs/day: 0.50    Years: 2.00    Total pack years: 1.00    Types: Cigarettes    Quit date: 02/07/1992    Years since quitting: 30.7   Smokeless tobacco: Never  Vaping Use   Vaping Use: Never used  Substance and Sexual Activity   Alcohol use: No   Drug use: No   Sexual activity: Not on file

## 2022-10-25 ENCOUNTER — Ambulatory Visit: Payer: Medicare Other | Admitting: Orthopedic Surgery

## 2022-10-25 DIAGNOSIS — I87332 Chronic venous hypertension (idiopathic) with ulcer and inflammation of left lower extremity: Secondary | ICD-10-CM | POA: Diagnosis not present

## 2022-10-31 ENCOUNTER — Encounter: Payer: Self-pay | Admitting: Orthopedic Surgery

## 2022-10-31 NOTE — Progress Notes (Signed)
Office Visit Note   Patient: Isabel Barnes           Date of Birth: August 23, 1941           MRN: [id number] Visit Date: 10/25/2022              Requested by: Jamal Collin, PA-C [address] [place],  [place] [address] PCP: Jamal Collin, [place]  Chief Complaint  Patient presents with   Left Leg - Follow-up   Right Leg - Follow-up      HPI: Patient is a 81 year old woman status post right lower extremity debridement April 14.  Patient has redness and swelling of her leg.  She has serous drainage.  Patient states she had a stomach bug over the weekend and that she drinks boost.  Assessment & Plan: Visit Diagnoses:  1. 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  Discussed with the significant swelling she needs to work on range of motion exercises elevation and compression.  Follow-Up Instructions: Return in about 2 weeks (around 11/08/2022).   Ortho Exam  Patient is alert, oriented, no adenopathy, well-dressed, normal affect, normal respiratory effort. Examination patient has edema swelling with clear serous drainage.  There is fibrinous exudative tissue over the large medial right calf ulcer.  There is no cellulitis no purulent drainage.  Imaging: No results found.   Labs: Lab Results  Component Value Date   ESRSEDRATE 40 (H) 02/07/2022   CRP 20.5 (H) 02/07/2022   REPTSTATUS 02/16/2022 FINAL 02/10/2022   GRAMSTAIN  02/10/2022    RARE WBC PRESENT, PREDOMINANTLY PMN NO ORGANISMS SEEN    CULT  02/10/2022    RARE ENTEROCOCCUS FAECALIS RARE BACTEROIDES SPECIES NOT FRAGILIS BETA LACTAMASE POSITIVE Performed at [facility], [address], [place], [place] [address]    [facility] ENTEROCOCCUS FAECALIS 02/10/2022     Lab Results  Component Value Date   ALBUMIN 1.5 (L) 02/09/2022   ALBUMIN 2.1  (L) 02/06/2022   ALBUMIN 3.7 06/25/2017    Lab Results  Component Value Date   MG 2.0 02/12/2022   MG 1.8 02/07/2022   Lab Results  Component Value Date   VD25OH 69.88 02/07/2022    No results found for: "PREALBUMIN"    Latest Ref Rng & Units 03/16/2022   12:00 AM 03/04/2022   12:00 AM 02/25/2022    1:16 AM  CBC EXTENDED  WBC  6.9     5.7     10.0   RBC 3.87 - 5.11 2.96     3.43     2.98   Hemoglobin 12.0 - 16.0 8.6     9.8     8.2   HCT 36 - 46 26     29     26.7   Platelets 150 - 400 K/uL 272     420     375   NEUT#   3.70     6.3   Lymph# 0.7 - 4.0 K/uL   2.1      This result is from an external source.     There is no height or weight on file to calculate BMI.  Orders:  No orders of the defined types were placed in this encounter.  No orders of the defined types were placed in this encounter.    Procedures:  No procedures performed  Clinical Data: No additional findings.  ROS:  All other systems negative, except as noted in the HPI. Review of Systems  Objective: Vital Signs: There were no vitals taken for this visit.  Specialty Comments:  No specialty comments available.  PMFS History: Patient Active Problem List   Diagnosis Date Noted   Abscess of right lower leg    Severe protein-calorie malnutrition (HCC)    PVD (peripheral vascular disease) (HCC)    Fall    Atrial fibrillation (HCC)    Goals of care, counseling/discussion    Anemia    Fluid collection (edema) in the arms, legs, hands and feet    Cellulitis of right lower extremity 02/06/2022   Chest pain, rule out acute myocardial infarction 03/16/2021   HTN (hypertension) 03/16/2021   Hypothyroidism 03/16/2021   HLD (hyperlipidemia) 03/16/2021   Aortic valve stenosis 03/16/2021   Chronic venous insufficiency of lower extremity 03/16/2021   Short-term memory loss 12/13/2019   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   Primary osteoarthritis of right knee 07/16/2016   Eosinophilia  06/16/2015   Chronic pain disorder    Osteoarthritis    Incisional umbilical hernia, without obstruction or gangrene    Iron deficiency anemia 09/19/2012   Past Medical History:  Diagnosis Date   Anemia    iron deficiency hx.has had iron infusions before    Chronic low back pain    Chronic pain disorder    Complication of anesthesia    severe claustrophobia   Constipation    r/t use of pain meds.Takes OTC meds or eats prunes   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HCC)    Unsteady gait    occasionally   Urinary urgency     Family History  Problem Relation Age of Onset   Stroke Father     Past Surgical History:  Procedure Laterality Date   ABDOMINAL AORTOGRAM W/LOWER EXTREMITY N/A 02/15/2022   Procedure: ABDOMINAL AORTOGRAM W/LOWER EXTREMITY;  Surgeon: Maeola Harman, MD;  Location: [facility] INVASIVE CV LAB;  Service: Cardiovascular;  Laterality: N/A;   ABDOMINAL HYSTERECTOMY     ANTERIOR FUSION CERVICAL SPINE     x2 -C4-7   APPLICATION OF WOUND VAC Right 03/12/2022   Procedure: APPLICATION OF WOUND VAC;  Surgeon: Nadara Mustard, MD;  Location: [facility] OR;  Service: Orthopedics;  Laterality: Right;   BUNIONECTOMY Bilateral    COLONOSCOPY      CYSTOSCOPY W/ URETEROSCOPY  2012   EYE SURGERY Bilateral    cataract /lens implant  HOLMIUM LASER APPLICATION Left 02/08/2013   Procedure: HOLMIUM LASER APPLICATION;  Surgeon: Anner Crete, MD;  Location[facility];  Service: Urology;  Laterality: Left;   I & D EXTREMITY Right 02/10/2022   Procedure: DEBRIDEMENT RIGHT LEG ABSCESS;  Surgeon: Nadara Mustard, MD;  Location: [facility] OR;  Service: Orthopedics;  Laterality: Right;   I & D EXTREMITY Right 03/12/2022   Procedure: RIGHT LEG IRRIGATION AND DEBRIDEMENT EXTREMITY;  Surgeon: Nadara Mustard, MD;  Location: [facility] OR;  Service: Orthopedics;  Laterality: Right;   INSERTION OF MESH N/A 07/15/2014   Procedure: INSERTION OF MESH;  Surgeon: Ardeth Sportsman, MD;  Location: [facility];  Service: General;  Laterality: N/A;   JOINT REPLACEMENT Right 2012   shoulder   LAPAROSCOPIC CHOLECYSTECTOMY W/ CHOLANGIOGRAPHY  2012   Dr Magnus Ivan   NASAL SINUS SURGERY     OPEN REDUCTION INTERNAL FIXATION (ORIF) DISTAL RADIAL FRACTURE Left 03/20/2021   Procedure: OPEN REDUCTION INTERNAL FIXATION (ORIF) DISTAL RADIAL FRACTURE;  Surgeon: Teryl Lucy, MD;  Location: [facility] OR;  Service: Orthopedics;  Laterality: Left;   RADIOLOGY WITH ANESTHESIA N/A 05/09/2014   Procedure: ADULT SEDATION WITH ANESTHESIA/MRI CERVICAL SPINE WITHOUT CONTRAST;  Surgeon: Medication Radiologist, MD;  Location: [facility] OR;  Service: Radiology;  Laterality: N/A;  DR. HAWKS/MRI   right knee arthroscopy     d/t meniscal tear   SHOULDER ARTHROSCOPY W/ ROTATOR CUFF REPAIR Bilateral three times each over several yrs   SKIN FULL THICKNESS GRAFT Right 03/12/2022   Procedure: SKIN GRAFT FULL THICKNESS;  Surgeon: Nadara Mustard, MD;  Location: [facility] OR;  Service: Orthopedics;  Laterality: Right;   SKIN SPLIT GRAFT Right 02/17/2022   Procedure: RIGHT LEG SKIN GRAFT;  Surgeon: Nadara Mustard, MD;  Location: [facility] OR;  Service: Orthopedics;  Laterality: Right;   THUMB ARTHROSCOPY Left    TOTAL KNEE ARTHROPLASTY  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   Procedure: RIGHT TOTAL KNEE ARTHROPLASTY;  Surgeon: Jodi Geralds, MD;  Location: [facility] OR;  Service: Orthopedics;  Laterality: Right;   UMBILICAL HERNIA REPAIR N/A 07/15/2014   Procedure: LAPAROSCOPIC UMBILICAL AND INFRAUMBILICAL HERNIA;  Surgeon: Ardeth Sportsman, MD;  Location: [facility] OR;  Service: General;  Laterality: N/A;   Social History   Occupational History   Not on file  Tobacco Use   Smoking status: Former    Packs/day: 0.50    Years: 2.00    Total pack years: 1.00    Types: Cigarettes    Quit date: 02/07/1992    Years since quitting: 30.7   Smokeless tobacco: Never  Vaping Use   Vaping Use: Never used  Substance and Sexual Activity   Alcohol use: No   Drug use: No   Sexual activity: Not on file

## 2022-11-08 ENCOUNTER — Encounter: Payer: Self-pay | Admitting: Orthopedic Surgery

## 2022-11-08 ENCOUNTER — Ambulatory Visit: Payer: Medicare Other | Admitting: Orthopedic Surgery

## 2022-11-08 DIAGNOSIS — I872 Venous insufficiency (chronic) (peripheral): Secondary | ICD-10-CM | POA: Diagnosis not present

## 2022-11-08 DIAGNOSIS — L97912 Non-pressure chronic ulcer of unspecified part of right lower leg with fat layer exposed: Secondary | ICD-10-CM

## 2022-11-08 NOTE — Progress Notes (Signed)
Office Visit Note   Patient: Isabel Barnes           Date of Birth: October 23, 1941           MRN: [id number] Visit Date: 11/08/2022              Requested by: Jamal Collin, PA-C 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  Patient has had significant improvement in decreased swelling she is continuing Silvadene dressing changes every other day.  Assessment & Plan: Visit Diagnoses:  1. Chronic ulcer of right leg, with fat layer exposed (HCC)   2. Venous insufficiency (chronic) (peripheral)     Plan: Continue with elevation and compression and Silvadene dressing changes.  Patient has a hospital bed being delivered that would allow her to elevate her legs even better.  Discussed that if we can continue decreasing the swelling we could proceed with tissue graft.  Follow-Up Instructions: Return in about 4 weeks (around 12/06/2022).   Ortho Exam  Patient is alert, oriented, no adenopathy, well-dressed, normal affect, normal respiratory effort. Examination patient has decreased swelling of both lower extremities.  A debrisoft and by sock was used to gently debride the wound.  Patient states the Vive sock felt better.  We will continue with elevation compression and dressing changes.  Imaging: No results found. No images are attached to the encounter.  Labs: Lab Results  Component Value Date   ESRSEDRATE 40 (H) 02/07/2022   CRP 20.5 (H) 02/07/2022   REPTSTATUS 02/16/2022 FINAL 02/10/2022   GRAMSTAIN  02/10/2022    RARE WBC PRESENT, PREDOMINANTLY PMN NO ORGANISMS SEEN    CULT  02/10/2022    RARE ENTEROCOCCUS FAECALIS RARE BACTEROIDES SPECIES NOT FRAGILIS BETA LACTAMASE POSITIVE Performed at [facility], [address], [place], [place] [address]    [facility] ENTEROCOCCUS FAECALIS 02/10/2022     Lab Results  Component Value Date   ALBUMIN 1.5 (L) 02/09/2022   ALBUMIN 2.1 (L) 02/06/2022   ALBUMIN 3.7 06/25/2017    Lab Results  Component Value Date   MG 2.0 02/12/2022   MG 1.8 02/07/2022   Lab Results  Component Value Date   VD25OH 69.88 02/07/2022    No results found for: "PREALBUMIN"    Latest Ref Rng & Units 03/16/2022   12:00 AM 03/04/2022   12:00 AM 02/25/2022    1:16 AM  CBC EXTENDED  WBC  6.9     5.7     10.0   RBC 3.87 - 5.11 2.96     3.43     2.98   Hemoglobin 12.0 - 16.0 8.6     9.8     8.2   HCT 36 - 46 26     29     26.7   Platelets 150 - 400 K/uL 272     420     375   NEUT#   3.70     6.3   Lymph# 0.7 - 4.0 K/uL   2.1      This result is from an external source.     There is no height or weight on file to calculate BMI.  Orders:  No orders of the defined types were placed in  this encounter.  No orders of the defined types were placed in this encounter.    Procedures: No procedures performed  Clinical Data: No additional findings.  ROS:  All other systems negative, except as noted in the HPI. Review of Systems  Objective: Vital Signs: There were no vitals taken for this visit.  Specialty Comments:  No specialty comments available.  PMFS History: Patient Active Problem List   Diagnosis Date Noted   Abscess of right lower leg    Severe protein-calorie malnutrition (HCC)    PVD (peripheral vascular disease) (HCC)    Fall    Atrial fibrillation (HCC)    Goals of care, counseling/discussion    Anemia    Fluid collection (edema) in the arms, legs, hands and feet    Cellulitis of right lower extremity 02/06/2022   Chest pain, rule out acute myocardial infarction 03/16/2021   HTN (hypertension) 03/16/2021   Hypothyroidism 03/16/2021   HLD (hyperlipidemia) 03/16/2021   Aortic valve stenosis 03/16/2021   Chronic venous insufficiency of lower extremity  03/16/2021   Short-term memory loss 12/13/2019   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   Primary osteoarthritis of right knee 07/16/2016   Eosinophilia 06/16/2015   Chronic pain disorder    Osteoarthritis    Incisional umbilical hernia, without obstruction or gangrene    Iron deficiency anemia 09/19/2012   Past Medical History:  Diagnosis Date   Anemia    iron deficiency hx.has had iron infusions before    Chronic low back pain    Chronic pain disorder    Complication of anesthesia    severe claustrophobia   Constipation    r/t use of pain meds.Takes OTC meds or eats prunes   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HCC)    Unsteady gait    occasionally   Urinary urgency     Family History  Problem Relation Age of Onset   Stroke Father     Past Surgical History:  Procedure Laterality Date   ABDOMINAL AORTOGRAM W/LOWER EXTREMITY N/A 02/15/2022   Procedure: ABDOMINAL AORTOGRAM W/LOWER EXTREMITY;  Surgeon: Maeola Harman, MD;  Location: [facility] INVASIVE CV LAB;  Service: Cardiovascular;  Laterality: N/A;   ABDOMINAL HYSTERECTOMY     ANTERIOR FUSION CERVICAL SPINE     x2 -C4-7   APPLICATION OF WOUND VAC Right 03/12/2022   Procedure:  APPLICATION OF WOUND VAC;  Surgeon: Nadara Mustard, MD;  Location: [facility] OR;  Service: Orthopedics;  Laterality: Right;   BUNIONECTOMY Bilateral    COLONOSCOPY  CYSTOSCOPY W/ URETEROSCOPY  2012   EYE SURGERY Bilateral    cataract /lens implant   HOLMIUM LASER APPLICATION Left 02/08/2013   Procedure: HOLMIUM LASER APPLICATION;  Surgeon: Anner Crete, MD;  Location: [facility];  Service: Urology;  Laterality: Left;   I & D EXTREMITY Right 02/10/2022   Procedure: DEBRIDEMENT RIGHT LEG ABSCESS;  Surgeon: Nadara Mustard, MD;  Location: [facility] OR;  Service: Orthopedics;  Laterality: Right;   I & D EXTREMITY Right 03/12/2022   Procedure: RIGHT LEG IRRIGATION AND DEBRIDEMENT EXTREMITY;  Surgeon: Nadara Mustard, MD;  Location: [facility] OR;  Service: Orthopedics;  Laterality: Right;   INSERTION OF MESH N/A 07/15/2014   Procedure: INSERTION OF MESH;  Surgeon: Ardeth Sportsman, MD;  Location: [facility] OR;  Service: General;  Laterality: N/A;   JOINT REPLACEMENT Right 2012   shoulder   LAPAROSCOPIC CHOLECYSTECTOMY W/ CHOLANGIOGRAPHY  2012   Dr Magnus Ivan   NASAL SINUS SURGERY     OPEN REDUCTION INTERNAL FIXATION (ORIF) DISTAL RADIAL FRACTURE Left 03/20/2021   Procedure: OPEN REDUCTION INTERNAL FIXATION (ORIF) DISTAL RADIAL FRACTURE;  Surgeon: Teryl Lucy, MD;  Location: [facility] OR;  Service: Orthopedics;  Laterality: Left;   RADIOLOGY WITH ANESTHESIA N/A 05/09/2014   Procedure: ADULT SEDATION WITH ANESTHESIA/MRI CERVICAL SPINE WITHOUT CONTRAST;  Surgeon: Medication Radiologist, MD;  Location: [facility] OR;  Service: Radiology;  Laterality: N/A;  DR. HAWKS/MRI   right knee arthroscopy     d/t meniscal tear   SHOULDER ARTHROSCOPY W/ ROTATOR CUFF REPAIR Bilateral three times each over several yrs   SKIN FULL THICKNESS GRAFT Right 03/12/2022   Procedure: SKIN GRAFT FULL THICKNESS;  Surgeon: Nadara Mustard, MD;  Location: [facility] OR;  Service: Orthopedics;  Laterality: Right;   SKIN SPLIT GRAFT Right 02/17/2022   Procedure: RIGHT  LEG SKIN GRAFT;  Surgeon: Nadara Mustard, MD;  Location: [facility] OR;  Service: Orthopedics;  Laterality: Right;   THUMB ARTHROSCOPY Left    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   Procedure: RIGHT TOTAL KNEE ARTHROPLASTY;  Surgeon: Jodi Geralds, MD;  Location: [facility] OR;  Service: Orthopedics;  Laterality: Right;   UMBILICAL HERNIA REPAIR N/A 07/15/2014   Procedure: LAPAROSCOPIC UMBILICAL AND INFRAUMBILICAL HERNIA;  Surgeon: Ardeth Sportsman, MD;  Location: [facility] OR;  Service: General;  Laterality: N/A;   Social History   Occupational History   Not on file  Tobacco Use   Smoking status: Former    Packs/day: 0.50    Years: 2.00    Total pack years: 1.00    Types: Cigarettes    Quit date: 02/07/1992    Years since quitting: 30.7   Smokeless tobacco: Never  Vaping Use   Vaping Use: Never used  Substance and Sexual Activity   Alcohol use: No   Drug use: No   Sexual activity: Not on file

## 2022-11-19 ENCOUNTER — Other Ambulatory Visit: Payer: Self-pay | Admitting: Adult Health

## 2022-11-19 DIAGNOSIS — J90 Pleural effusion, not elsewhere classified: Secondary | ICD-10-CM

## 2022-12-01 ENCOUNTER — Encounter: Payer: Self-pay | Admitting: Orthopedic Surgery

## 2022-12-01 ENCOUNTER — Other Ambulatory Visit: Payer: Self-pay | Admitting: Adult Health

## 2022-12-01 DIAGNOSIS — M159 Polyosteoarthritis, unspecified: Secondary | ICD-10-CM

## 2022-12-06 ENCOUNTER — Ambulatory Visit: Payer: Medicare Other | Admitting: Orthopedic Surgery

## 2022-12-06 DIAGNOSIS — I872 Venous insufficiency (chronic) (peripheral): Secondary | ICD-10-CM | POA: Diagnosis not present

## 2022-12-06 DIAGNOSIS — L97912 Non-pressure chronic ulcer of unspecified part of right lower leg with fat layer exposed: Secondary | ICD-10-CM

## 2022-12-10 ENCOUNTER — Encounter: Payer: Self-pay | Admitting: Orthopedic Surgery

## 2022-12-10 NOTE — Progress Notes (Signed)
Office Visit Note   Patient: Isabel Barnes           Date of Birth: [birth year]           MRN: [id number] Visit Date: 12/06/2022              Requested by: Jamal Collin, PA-C [address] [place],  [place] [address] PCP: Jamal Collin, [place]  Chief Complaint  Patient presents with   Right Leg - Follow-up   Left Leg - Follow-up      HPI: Patient is a 82 year old woman who presents in follow-up for right lower extremity and venous stasis insufficiency ulceration.  Patient has been using elevation and compression and Silvadene dressing changes.  She states she now has a hospital bed.  Assessment & Plan: Visit Diagnoses:  1. Chronic ulcer of right leg, with fat layer exposed (HCC)   2. Venous insufficiency (chronic) (peripheral)     Plan: Continue with compression with home health nursing both legs were wrapped.  Follow-Up Instructions: Return in about 4 weeks (around 01/03/2023).   Ortho Exam  Patient is alert, oriented, no adenopathy, well-dressed, normal affect, normal respiratory effort. Examination the wound measures 7 x 12 cm the wound bed is flat with a thin fibrinous tissue.  The dermatitis is resolving.  Examination of her shoulder she has crepitation with range of motion of the shoulders and active abduction of 90 degrees elbow flexion extension is equal bilaterally grip strength is equal bilaterally.  There are no radicular symptoms.  Patient has advanced arthritic changes of both hands both the PIP DIP joints as well as the MCP joints.  She is currently on prednisone 5 mg.  Imaging: No results found.   Labs: Lab Results  Component Value Date   ESRSEDRATE 40 (H) 02/07/2022   CRP 20.5 (H) 02/07/2022   REPTSTATUS 02/16/2022 FINAL 02/10/2022   GRAMSTAIN  02/10/2022    RARE WBC PRESENT, PREDOMINANTLY PMN NO ORGANISMS SEEN    CULT  02/10/2022    RARE ENTEROCOCCUS FAECALIS RARE BACTEROIDES SPECIES NOT FRAGILIS BETA LACTAMASE  POSITIVE Performed at [facility], [address], [place], [place] [address]    [facility] ENTEROCOCCUS FAECALIS 02/10/2022     Lab Results  Component Value Date   ALBUMIN 1.5 (L) 02/09/2022   ALBUMIN 2.1 (L) 02/06/2022   ALBUMIN 3.7 06/25/2017    Lab Results  Component Value Date   MG 2.0 02/12/2022   MG 1.8 02/07/2022   Lab Results  Component Value Date   VD25OH 69.88 02/07/2022    No results found for: "PREALBUMIN"    Latest Ref Rng & Units 03/16/2022   12:00 AM 03/04/2022   12:00 AM 02/25/2022    1:16 AM  CBC EXTENDED  WBC  6.9     5.7     10.0   RBC 3.87 - 5.11 2.96     3.43     2.98   Hemoglobin 12.0 - 16.0 8.6     9.8     8.2   HCT 36 - 46 26     29     26.7   Platelets 150 - 400 K/uL 272     420     375   NEUT#   3.70     6.3   Lymph# 0.7 - 4.0 K/uL   2.1      This result is from an external source.     There is no height or  weight on file to calculate BMI.  Orders:  No orders of the defined types were placed in this encounter.  No orders of the defined types were placed in this encounter.    Procedures: No procedures performed  Clinical Data: No additional findings.  ROS:  All other systems negative, except as noted in the HPI. Review of Systems  Objective: Vital Signs: There were no vitals taken for this visit.  Specialty Comments:  No specialty comments available.  PMFS History: Patient Active Problem List   Diagnosis Date Noted   Abscess of right lower leg    Severe protein-calorie malnutrition ([place])    PVD (peripheral vascular disease) ([place])    Fall    Atrial fibrillation ([place])    Goals of care, counseling/discussion    Anemia    Fluid collection (edema) in the arms, legs, hands and feet    Cellulitis of right lower extremity 02/06/2022   Chest pain, rule out acute myocardial infarction 03/16/2021   HTN (hypertension) 03/16/2021   Hypothyroidism 03/16/2021   HLD (hyperlipidemia) 03/16/2021   Aortic valve stenosis  03/16/2021   Chronic venous insufficiency of lower extremity 03/16/2021   Short-term memory loss 12/13/2019   Psoriatic arthritis ([place]) 12/13/2019   Primary osteoarthritis of right knee 07/16/2016   Eosinophilia 06/16/2015   Chronic pain disorder    Osteoarthritis    Incisional umbilical hernia, without obstruction or gangrene    Iron deficiency anemia 09/19/2012   Past Medical History:  Diagnosis Date   Anemia    iron deficiency hx.has had iron infusions before    Chronic low back pain    Chronic pain disorder    Complication of anesthesia    severe claustrophobia   Constipation    r/t use of pain meds.Takes OTC meds or eats prunes   CVA (cerebral vascular accident) ([place])    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[place])    Unsteady gait    occasionally   Urinary urgency     Family History  Problem Relation Age of Onset   Stroke Father     Past Surgical History:  Procedure Laterality Date   ABDOMINAL AORTOGRAM W/LOWER EXTREMITY N/A 02/15/2022   Procedure: ABDOMINAL AORTOGRAM W/LOWER EXTREMITY;  Surgeon: Waynetta Sandy, MD;  Location: [place] [facility] LAB;  Service: Cardiovascular;  Laterality: N/A;   ABDOMINAL HYSTERECTOMY     ANTERIOR FUSION CERVICAL SPINE     x2 -N2-3    APPLICATION OF WOUND VAC Right 03/12/2022   Procedure: APPLICATION OF WOUND VAC;  Surgeon: Newt Minion, MD;  Location: [place];  Service: Orthopedics;  Laterality: Right;   BUNIONECTOMY Bilateral    COLONOSCOPY     CYSTOSCOPY W/ URETEROSCOPY  2012   EYE SURGERY Bilateral    cataract /lens implant   HOLMIUM LASER APPLICATION Left 2/44/0102   Procedure: HOLMIUM LASER APPLICATION;  Surgeon: Malka So, MD;  Location: [facility];  Service: Urology;  Laterality: Left;   I & D EXTREMITY Right 02/10/2022   Procedure: DEBRIDEMENT RIGHT LEG ABSCESS;  Surgeon: Newt Minion, MD;  Location: [place];  Service: Orthopedics;  Laterality: Right;   I & D EXTREMITY Right 03/12/2022   Procedure: RIGHT LEG IRRIGATION AND DEBRIDEMENT EXTREMITY;  Surgeon: Newt Minion, MD;  Location: [place];  Service: Orthopedics;  Laterality: Right;   INSERTION OF MESH N/A 07/15/2014   Procedure: INSERTION OF MESH;  Surgeon: Adin Hector, MD;  Location: [place];  Service: General;  Laterality: N/A;   JOINT REPLACEMENT Right 2012   shoulder   LAPAROSCOPIC CHOLECYSTECTOMY W/ CHOLANGIOGRAPHY  2012   Dr Ninfa Linden   NASAL SINUS SURGERY     OPEN REDUCTION INTERNAL FIXATION (ORIF) DISTAL RADIAL FRACTURE Left 03/20/2021   Procedure: OPEN REDUCTION INTERNAL FIXATION (ORIF) DISTAL RADIAL FRACTURE;  Surgeon: Marchia Bond, MD;  Location: [place];  Service: Orthopedics;  Laterality: Left;   RADIOLOGY WITH ANESTHESIA N/A 05/09/2014   Procedure: ADULT SEDATION WITH ANESTHESIA/MRI CERVICAL SPINE WITHOUT CONTRAST;  Surgeon: Medication Radiologist, MD;  Location: [place];  Service: Radiology;  Laterality: N/A;  DR. HAWKS/MRI   right knee arthroscopy     d/t meniscal tear   SHOULDER ARTHROSCOPY W/ ROTATOR CUFF REPAIR Bilateral three times each over several yrs   SKIN FULL THICKNESS GRAFT Right 03/12/2022   Procedure: SKIN GRAFT FULL THICKNESS;  Surgeon: Newt Minion, MD;  Location: [place];  Service: Orthopedics;  Laterality: Right;    SKIN SPLIT GRAFT Right 02/17/2022   Procedure: RIGHT LEG SKIN GRAFT;  Surgeon: Newt Minion, MD;  Location: [place];  Service: Orthopedics;  Laterality: Right;   THUMB ARTHROSCOPY Left    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   Procedure: RIGHT TOTAL KNEE ARTHROPLASTY;  Surgeon: Dorna Leitz, MD;  Location: [place];  Service: Orthopedics;  Laterality: Right;   UMBILICAL HERNIA REPAIR N/A 07/15/2014   Procedure: LAPAROSCOPIC UMBILICAL AND INFRAUMBILICAL HERNIA;  Surgeon: Adin Hector, MD;  Location: [place];  Service: General;  Laterality: N/A;   Social History   Occupational History   Not on file  Tobacco Use   Smoking status: Former    Packs/day: 0.50    Years: 2.00    Total pack years: 1.00    Types: Cigarettes    Quit date: 02/07/1992    Years since quitting: 30.8   Smokeless tobacco: Never  Vaping Use   Vaping Use: Never used  Substance and Sexual Activity   Alcohol use: No   Drug use: No   Sexual activity: Not on file

## 2023-01-03 ENCOUNTER — Ambulatory Visit: Payer: Medicare Other | Admitting: Orthopedic Surgery

## 2023-01-06 ENCOUNTER — Ambulatory Visit: Payer: Medicare Other | Admitting: Orthopedic Surgery

## 2023-01-11 ENCOUNTER — Ambulatory Visit: Payer: Medicare Other | Admitting: Orthopedic Surgery

## 2023-01-11 DIAGNOSIS — I890 Lymphedema, not elsewhere classified: Secondary | ICD-10-CM | POA: Diagnosis not present

## 2023-01-11 DIAGNOSIS — L97912 Non-pressure chronic ulcer of unspecified part of right lower leg with fat layer exposed: Secondary | ICD-10-CM | POA: Diagnosis not present

## 2023-01-14 ENCOUNTER — Encounter: Payer: Self-pay | Admitting: Orthopedic Surgery

## 2023-01-14 NOTE — Progress Notes (Signed)
Office Visit Note   Patient: Isabel Barnes           Date of Birth: March 23, 1941           MRN: [id number] Visit Date: 01/11/2023              Requested by: Ardith Dark, PA-C 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  Patient states she has had itching from Vicodin.  Patient is continue with compression with home health nursing for both legs.  Patient has developed weeping blisters on the lateral aspect of both legs.  Patient also complains of generalized neck pain without radicular symptoms.  Assessment & Plan: Visit Diagnoses:  1. Lymphedema   2. Chronic ulcer of right leg, with fat layer exposed ([place])     Plan: Recommended heat for her cervical pain.  Continue Silvadene for the medial right leg ulcer continue compression.  Patient was given a silicone sleeve for the toe ulcer.  Follow-Up Instructions: Return in about 4 weeks (around 02/08/2023).   Ortho Exam  Patient is alert, oriented, no adenopathy, well-dressed, normal affect, normal respiratory effort. Examination of both lower extremities patient has brawny edema of both legs induration and weeping edema laterally.  Patient has the chronic ulcer over the medial right calf that measures 9 x 15 cm covered with fibrinous exudative tissue.  She has a small 3 mm ulcer medial first webspace.  She was given a silicone pad to wear over the second toe.  Imaging: No results found.     Labs: Lab Results  Component Value Date   ESRSEDRATE 40 (H) 02/07/2022   CRP 20.5 (H) 02/07/2022   REPTSTATUS 02/16/2022 FINAL 02/10/2022   GRAMSTAIN  02/10/2022    RARE WBC PRESENT, PREDOMINANTLY PMN NO ORGANISMS  SEEN    CULT  02/10/2022    RARE ENTEROCOCCUS FAECALIS RARE BACTEROIDES SPECIES NOT FRAGILIS BETA LACTAMASE POSITIVE Performed at [facility], [place] [address], [place], [place] [address]    LABORGA ENTEROCOCCUS FAECALIS 02/10/2022     Lab Results  Component Value Date   ALBUMIN 1.5 (L) 02/09/2022   ALBUMIN 2.1 (L) 02/06/2022   ALBUMIN 3.7 06/25/2017    Lab Results  Component Value Date   MG 2.0 02/12/2022   MG 1.8 02/07/2022   Lab Results  Component Value Date   VD25OH 69.88 02/07/2022    No results found for: "PREALBUMIN"    Latest Ref Rng & Units 03/16/2022   12:00 AM 03/04/2022   12:00 AM 02/25/2022    1:16 AM  CBC EXTENDED  WBC  6.9     5.7     10.0   RBC 3.87 - 5.11 2.96     3.43     2.98   Hemoglobin 12.0 - 16.0 8.6     9.8     8.2   HCT 36 - 46 26     29     26.7   Platelets 150 - 400 K/uL 272     420     375   NEUT#   3.70     6.3   Lymph# 0.7 - 4.0  K/uL   2.1      This result is from an external source.     There is no height or weight on file to calculate BMI.  Orders:  No orders of the defined types were placed in this encounter.  No orders of the defined types were placed in this encounter.    Procedures: No procedures performed  Clinical Data: No additional findings.  ROS:  All other systems negative, except as noted in the HPI. Review of Systems  Objective: Vital Signs: There were no vitals taken for this visit.  Specialty Comments:  No specialty comments available.  PMFS History: Patient Active Problem List   Diagnosis Date Noted   Abscess of right lower leg    Severe protein-calorie malnutrition ([place])    PVD (peripheral vascular disease) ([place])    Fall    Atrial fibrillation ([place])    Goals of care, counseling/discussion    Anemia    Fluid collection (edema) in the arms, legs, hands and feet    Cellulitis of right lower extremity 02/06/2022   Chest pain, rule out acute myocardial infarction 03/16/2021   HTN  (hypertension) 03/16/2021   Hypothyroidism 03/16/2021   HLD (hyperlipidemia) 03/16/2021   Aortic valve stenosis 03/16/2021   Chronic venous insufficiency of lower extremity 03/16/2021   Short-term memory loss 12/13/2019   Psoriatic arthritis ([place]) 12/13/2019   Primary osteoarthritis of right knee 07/16/2016   Eosinophilia 06/16/2015   Chronic pain disorder    Osteoarthritis    Incisional umbilical hernia, without obstruction or gangrene    Iron deficiency anemia 09/19/2012   Past Medical History:  Diagnosis Date   Anemia    iron deficiency hx.has had iron infusions before    Chronic low back pain    Chronic pain disorder    Complication of anesthesia    severe claustrophobia   Constipation    r/t use of pain meds.Takes OTC meds or eats prunes   CVA (cerebral vascular accident) ([place])    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[place])    Unsteady gait    occasionally   Urinary urgency     Family History  Problem Relation Age of Onset   Stroke Father     Past Surgical History:  Procedure Laterality Date   ABDOMINAL AORTOGRAM W/LOWER EXTREMITY N/A 02/15/2022   Procedure: ABDOMINAL AORTOGRAM W/LOWER EXTREMITY;  Surgeon: Waynetta Sandy, MD;  Location: [facility];  Service:  Cardiovascular;  Laterality: N/A;   ABDOMINAL HYSTERECTOMY     ANTERIOR FUSION CERVICAL SPINE     x2 -C4-7  APPLICATION OF WOUND VAC Right 03/12/2022   Procedure: APPLICATION OF WOUND VAC;  Surgeon: Newt Minion, MD;  Location: [facility];  Service: Orthopedics;  Laterality: Right;   BUNIONECTOMY Bilateral    COLONOSCOPY     CYSTOSCOPY W/ URETEROSCOPY  2012   EYE SURGERY Bilateral    cataract /lens implant   HOLMIUM LASER APPLICATION Left [id number]   Procedure: HOLMIUM LASER APPLICATION;  Surgeon: Malka So, MD;  Location: [facility];  Service: Urology;  Laterality: Left;   I & D EXTREMITY Right 02/10/2022   Procedure: DEBRIDEMENT RIGHT LEG ABSCESS;  Surgeon: Newt Minion, MD;  Location: [facility];  Service: Orthopedics;  Laterality: Right;   I & D EXTREMITY Right 03/12/2022   Procedure: RIGHT LEG IRRIGATION AND DEBRIDEMENT EXTREMITY;  Surgeon: Newt Minion, MD;  Location: [facility];  Service: Orthopedics;  Laterality: Right;   INSERTION OF MESH N/A 07/15/2014   Procedure: INSERTION OF MESH;  Surgeon: Adin Hector, MD;  Location: [facility];  Service: General;  Laterality: N/A;   JOINT REPLACEMENT Right 2012   shoulder   LAPAROSCOPIC CHOLECYSTECTOMY W/ CHOLANGIOGRAPHY  2012   Dr Ninfa Linden   NASAL SINUS SURGERY     OPEN REDUCTION INTERNAL FIXATION (ORIF) DISTAL RADIAL FRACTURE Left 03/20/2021   Procedure: OPEN REDUCTION INTERNAL FIXATION (ORIF) DISTAL RADIAL FRACTURE;  Surgeon: Marchia Bond, MD;  Location: [facility];  Service: Orthopedics;  Laterality: Left;   RADIOLOGY WITH ANESTHESIA N/A 05/09/2014   Procedure: ADULT SEDATION WITH ANESTHESIA/MRI CERVICAL SPINE WITHOUT CONTRAST;  Surgeon: Medication Radiologist, MD;  Location: [facility];  Service: Radiology;  Laterality: N/A;  DR. HAWKS/MRI   right knee arthroscopy     d/t meniscal tear   SHOULDER ARTHROSCOPY W/ ROTATOR CUFF REPAIR Bilateral three times each over several yrs   SKIN FULL THICKNESS GRAFT Right 03/12/2022   Procedure: SKIN  GRAFT FULL THICKNESS;  Surgeon: Newt Minion, MD;  Location: [facility];  Service: Orthopedics;  Laterality: Right;   SKIN SPLIT GRAFT Right 02/17/2022   Procedure: RIGHT LEG SKIN GRAFT;  Surgeon: Newt Minion, MD;  Location: [facility];  Service: Orthopedics;  Laterality: Right;   THUMB ARTHROSCOPY Left    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   Procedure: RIGHT TOTAL KNEE ARTHROPLASTY;  Surgeon: Dorna Leitz, MD;  Location: [facility];  Service: Orthopedics;  Laterality: Right;   UMBILICAL HERNIA REPAIR N/A 07/15/2014   Procedure: LAPAROSCOPIC UMBILICAL AND INFRAUMBILICAL HERNIA;  Surgeon: Adin Hector, MD;  Location: [facility];  Service: General;  Laterality: N/A;   Social History   Occupational History   Not on file  Tobacco Use   Smoking status: Former    Packs/day: 0.50    Years: 2.00    Total pack years: 1.00    Types: Cigarettes    Quit date: 02/07/1992    Years since quitting: 30.9   Smokeless tobacco: Never  Vaping Use   Vaping Use: Never used  Substance and Sexual Activity   Alcohol use: No   Drug use: No   Sexual activity: Not on file

## 2023-01-19 ENCOUNTER — Encounter: Payer: Self-pay | Admitting: Orthopedic Surgery

## 2023-01-25 ENCOUNTER — Ambulatory Visit: Payer: Medicare Other | Admitting: Orthopedic Surgery

## 2023-01-25 ENCOUNTER — Encounter: Payer: Self-pay | Admitting: Orthopedic Surgery

## 2023-01-25 DIAGNOSIS — I890 Lymphedema, not elsewhere classified: Secondary | ICD-10-CM

## 2023-01-25 DIAGNOSIS — L97511 Non-pressure chronic ulcer of other part of right foot limited to breakdown of skin: Secondary | ICD-10-CM | POA: Diagnosis not present

## 2023-01-25 DIAGNOSIS — L97912 Non-pressure chronic ulcer of unspecified part of right lower leg with fat layer exposed: Secondary | ICD-10-CM | POA: Diagnosis not present

## 2023-01-25 MED ORDER — CEPHALEXIN 500 MG PO CAPS: 500.0000 mg | ORAL_CAPSULE | Freq: Three times a day (TID) | ORAL | 0 refills | Status: DC

## 2023-01-25 NOTE — Progress Notes (Signed)
Office Visit Note   Patient: Isabel Barnes           Date of Birth: March 07, 1941           MRN: [id number] Visit Date: 01/25/2023              Requested by: Ardith Dark, PA-C [id number] [place],  [place] [address] PCP: Ardith Dark, [place]  Chief Complaint  Patient presents with   Right Leg - Follow-up   Left Leg - Follow-up      HPI: Patient is a 82 year old woman who presents in follow-up for chronic venous and lymphatic insufficiency both lower extremities.  Patient has developed a new ulcer on the dorsum of the right second toe.  Assessment & Plan: Visit Diagnoses:  1. Lymphedema   2. Chronic ulcer of right leg, with fat layer exposed ([place])   3. Ulcer of toe, right, limited to breakdown of skin ([place])     Plan: Prescription to be called in for Keflex 500 mg 3 times a day recommended taking a probiotic.  Patient has extensive allergies to antibiotics but has tolerated cephalosporins in the past.  Follow-Up Instructions: Return in about 1 week (around 02/01/2023).   Ortho Exam  Patient is alert, oriented, no adenopathy, well-dressed, normal affect, normal respiratory effort. Examination patient has brawny edema both lower extremities and a chronic ulcer medial right calf which is unchanged.  Patient has developed sausage digit swelling of the right second toe with an ulcer over the PIP joint.  The ulcer is flat and has decompressed.  There is no exposed tendon or bone.  But definitely concerning for underlying bone infection with the sausage digit swelling.  Imaging: No results found.     Labs: Lab Results  Component Value Date   ESRSEDRATE 40 (H) 02/07/2022   CRP 20.5 (H) 02/07/2022   REPTSTATUS 02/16/2022 FINAL 02/10/2022   GRAMSTAIN  02/10/2022    RARE WBC PRESENT, PREDOMINANTLY PMN NO ORGANISMS SEEN    CULT  02/10/2022    RARE ENTEROCOCCUS FAECALIS RARE BACTEROIDES SPECIES NOT FRAGILIS BETA LACTAMASE POSITIVE Performed at [facility], [place] [address] [place], [place] [address]    LABORGA ENTEROCOCCUS FAECALIS 02/10/2022     Lab Results  Component Value Date   ALBUMIN 1.5 (L) 02/09/2022   ALBUMIN 2.1 (L) 02/06/2022   ALBUMIN 3.7 06/25/2017    Lab Results  Component Value Date   MG 2.0 02/12/2022   MG 1.8 02/07/2022   Lab Results  Component Value Date   VD25OH 69.88 02/07/2022    No results found for: "PREALBUMIN"    Latest Ref Rng & Units 03/16/2022   12:00 AM 03/04/2022   12:00 AM 02/25/2022    1:16 AM  CBC EXTENDED  WBC  6.9     5.7     10.0   RBC 3.87 - 5.11 2.96     3.43     2.98   Hemoglobin 12.0 - 16.0 8.6     9.8     8.2   HCT 36 - 46 26     29     26.7   Platelets 150 - 400 K/uL 272     420     375   NEUT#   3.70     6.3   Lymph# 0.7 - 4.0 K/uL   2.1      This result is from an external source.     There is  no height or weight on file to calculate BMI.  Orders:  No orders of the defined types were placed in this encounter.  No orders of the defined types were placed in this encounter.    Procedures: No procedures performed  Clinical Data: No additional findings.  ROS:  All other systems negative, except as noted in the HPI. Review of Systems  Objective: Vital Signs: There were no vitals taken for this visit.  Specialty Comments:  No specialty comments available.  PMFS History: Patient Active Problem List   Diagnosis Date Noted   Abscess of right lower leg    Severe protein-calorie malnutrition ([place])    PVD (peripheral vascular disease) ([place])    Fall    Atrial fibrillation ([place])    Goals of care, counseling/discussion    Anemia    Fluid collection (edema) in the arms, legs, hands and feet    Cellulitis of right lower extremity 02/06/2022   Chest pain, rule out acute myocardial infarction 03/16/2021   HTN (hypertension) 03/16/2021   Hypothyroidism 03/16/2021   HLD (hyperlipidemia) 03/16/2021   Aortic valve stenosis 03/16/2021   Chronic venous  insufficiency of lower extremity 03/16/2021   Short-term memory loss 12/13/2019   Psoriatic arthritis ([place]) 12/13/2019   Primary osteoarthritis of right knee 07/16/2016   Eosinophilia 06/16/2015   Chronic pain disorder    Osteoarthritis    Incisional umbilical hernia, without obstruction or gangrene    Iron deficiency anemia 09/19/2012   Past Medical History:  Diagnosis Date   Anemia    iron deficiency hx.has had iron infusions before    Chronic low back pain    Chronic pain disorder    Complication of anesthesia    severe claustrophobia   Constipation    r/t use of pain meds.Takes OTC meds or eats prunes   CVA (cerebral vascular accident) ([place])    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[place])    Unsteady gait    occasionally   Urinary urgency     Family History  Problem Relation Age of Onset   Stroke Father     Past Surgical History:  Procedure Laterality Date   ABDOMINAL AORTOGRAM W/LOWER EXTREMITY N/A 02/15/2022   Procedure: ABDOMINAL AORTOGRAM W/LOWER EXTREMITY;  Surgeon: Waynetta Sandy, MD;  Location: [facility];  Service: Cardiovascular;  Laterality: N/A;   ABDOMINAL HYSTERECTOMY     ANTERIOR FUSION CERVICAL SPINE     x2 -[id number]   APPLICATION OF WOUND VAC Right  03/12/2022   Procedure: APPLICATION OF WOUND VAC;  Surgeon: Newt Minion, MD;  Location: [facility];  Service: Orthopedics;  Laterality: Right;   BUNIONECTOMY Bilateral    COLONOSCOPY     CYSTOSCOPY W/ URETEROSCOPY  2012   EYE SURGERY Bilateral    cataract /lens implant   HOLMIUM LASER APPLICATION Left [id number]   Procedure: HOLMIUM LASER APPLICATION;  Surgeon: Malka So, MD;  Location: [facility];  Service: Urology;  Laterality: Left;   I & D EXTREMITY Right 02/10/2022   Procedure: DEBRIDEMENT RIGHT LEG ABSCESS;  Surgeon: Newt Minion, MD;  Location: [facility];  Service: Orthopedics;  Laterality: Right;   I & D EXTREMITY Right 03/12/2022   Procedure: RIGHT LEG IRRIGATION AND DEBRIDEMENT EXTREMITY;  Surgeon: Newt Minion, MD;  Location: [facility];  Service: Orthopedics;  Laterality: Right;   INSERTION OF MESH N/A 07/15/2014   Procedure: INSERTION OF MESH;  Surgeon: Adin Hector, MD;  Location: [facility];  Service: General;  Laterality: N/A;   JOINT REPLACEMENT Right 2012   shoulder   LAPAROSCOPIC CHOLECYSTECTOMY W/ CHOLANGIOGRAPHY  2012   Dr Ninfa Linden   NASAL SINUS SURGERY     OPEN REDUCTION INTERNAL FIXATION (ORIF) DISTAL RADIAL FRACTURE Left 03/20/2021   Procedure: OPEN REDUCTION INTERNAL FIXATION (ORIF) DISTAL RADIAL FRACTURE;  Surgeon: Marchia Bond, MD;  Location: [facility];  Service: Orthopedics;  Laterality: Left;   RADIOLOGY WITH ANESTHESIA N/A 05/09/2014   Procedure: ADULT SEDATION WITH ANESTHESIA/MRI CERVICAL SPINE WITHOUT CONTRAST;  Surgeon: Medication Radiologist, MD;  Location: [facility];  Service: Radiology;  Laterality: N/A;  DR. HAWKS/MRI   right knee arthroscopy     d/t meniscal tear   SHOULDER ARTHROSCOPY W/ ROTATOR CUFF REPAIR Bilateral three times each over several yrs   SKIN FULL THICKNESS GRAFT Right 03/12/2022   Procedure: SKIN GRAFT FULL THICKNESS;  Surgeon: Newt Minion, MD;  Location: [facility];  Service: Orthopedics;  Laterality: Right;   SKIN SPLIT GRAFT Right  02/17/2022   Procedure: RIGHT LEG SKIN GRAFT;  Surgeon: Newt Minion, MD;  Location: [facility];  Service: Orthopedics;  Laterality: Right;   THUMB ARTHROSCOPY Left    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   Procedure: RIGHT TOTAL KNEE ARTHROPLASTY;  Surgeon: Dorna Leitz, MD;  Location: [facility];  Service: Orthopedics;  Laterality: Right;   UMBILICAL HERNIA REPAIR N/A 07/15/2014   Procedure: LAPAROSCOPIC UMBILICAL AND INFRAUMBILICAL HERNIA;  Surgeon: Adin Hector, MD;  Location: [facility];  Service: General;  Laterality: N/A;   Social History   Occupational History   Not on file  Tobacco Use   Smoking status: Former    Packs/day: 0.50    Years: 2.00    Total pack years: 1.00    Types: Cigarettes    Quit date: 02/07/1992    Years since quitting: 30.9   Smokeless tobacco: Never  Vaping Use   Vaping Use: Never used  Substance and Sexual Activity   Alcohol use: No   Drug use: No   Sexual activity: Not on file

## 2023-01-28 ENCOUNTER — Encounter: Payer: Self-pay | Admitting: Orthopedic Surgery

## 2023-02-02 MED ORDER — DOXYCYCLINE HYCLATE 100 MG PO TABS: 100.0000 mg | ORAL_TABLET | Freq: Two times a day (BID) | ORAL | 0 refills | Status: DC

## 2023-02-08 ENCOUNTER — Ambulatory Visit: Payer: Medicare Other | Admitting: Orthopedic Surgery

## 2023-02-08 DIAGNOSIS — I890 Lymphedema, not elsewhere classified: Secondary | ICD-10-CM

## 2023-02-08 DIAGNOSIS — I87332 Chronic venous hypertension (idiopathic) with ulcer and inflammation of left lower extremity: Secondary | ICD-10-CM | POA: Diagnosis not present

## 2023-02-08 DIAGNOSIS — L97912 Non-pressure chronic ulcer of unspecified part of right lower leg with fat layer exposed: Secondary | ICD-10-CM | POA: Diagnosis not present

## 2023-02-15 ENCOUNTER — Ambulatory Visit: Payer: Medicare Other | Admitting: Orthopedic Surgery

## 2023-02-21 ENCOUNTER — Encounter: Payer: Self-pay | Admitting: Orthopedic Surgery

## 2023-02-21 NOTE — Progress Notes (Signed)
Office Visit Note   Patient: Isabel Barnes           Date of Birth: 03-Dec-1940           MRN: [id number] Visit Date: 02/08/2023              Requested by: Ardith Dark, PA-C [id number] [place],  [place] [address] PCP: Ardith Dark, [place]  Chief Complaint  Patient presents with   Right Leg - Follow-up   Left Leg - Follow-up      HPI: Patient is an 82 year old woman who presents in follow-up for bilateral lower extremity venous and lymphatic insufficiency ulceration.  Patient states that Keflex caused GI upset so she stopped taking this she is taking probiotics.  Currently using Silvadene dressing changes patient states she does have lymphedema pumps but is not using them.  Assessment & Plan: Visit Diagnoses:  1. Lymphedema   2. 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 Chronic ulcer of right leg, with fat layer exposed ([place])     Plan: Continue wound care continue elevation.  Follow-Up Instructions: Return in about 4 weeks (around 03/08/2023).   Ortho Exam  Patient is alert, oriented, no adenopathy, well-dressed, normal affect, normal respiratory effort. Examination of both lower extremities patient has pitting edema bilaterally.  The right leg medially has a large insufficiency ulcer that is 10 x 14 mm with fibrinous exudative tissue and brawny edema bilaterally there is no drainage.  Patient does have sausage digit swelling of the second toe right foot but no cellulitis no exposed joint.  There is a Wagner grade 1 ulcer beneath the first metatarsal head right foot.  Patient is using Bactroban on the right second toe.  Imaging: No results found.     Labs: Lab Results  Component Value Date   ESRSEDRATE 40 (H) 02/07/2022   CRP 20.5 (H) 02/07/2022   REPTSTATUS 02/16/2022 FINAL 02/10/2022   GRAMSTAIN  02/10/2022    RARE WBC PRESENT, PREDOMINANTLY PMN NO ORGANISMS SEEN    CULT  02/10/2022    RARE  ENTEROCOCCUS FAECALIS RARE BACTEROIDES SPECIES NOT FRAGILIS BETA LACTAMASE POSITIVE Performed at [facility], [place] [address], [place], [place] [address]    [facility] ENTEROCOCCUS FAECALIS 02/10/2022     Lab Results  Component Value Date   ALBUMIN 1.5 (L) 02/09/2022   ALBUMIN 2.1 (L) 02/06/2022   ALBUMIN 3.7 06/25/2017    Lab Results  Component Value Date   MG 2.0 02/12/2022   MG 1.8 02/07/2022   Lab Results  Component Value Date   VD25OH 69.88 02/07/2022    No results found for: "PREALBUMIN"    Latest Ref Rng & Units 03/16/2022   12:00 AM 03/04/2022   12:00 AM 02/25/2022    1:16 AM  CBC EXTENDED  WBC  6.9     5.7     10.0   RBC 3.87 - 5.11 2.96     3.43     2.98   Hemoglobin 12.0 - 16.0 8.6     9.8     8.2   HCT 36 - 46 26     29     26.7   Platelets 150 - 400 K/uL 272     420     375   NEUT#   3.70     6.3   Lymph# 0.7 - 4.0 K/uL   2.1      This  result is from an external source.     There is no height or weight on file to calculate BMI.  Orders:  No orders of the defined types were placed in this encounter.  No orders of the defined types were placed in this encounter.    Procedures: No procedures performed  Clinical Data: No additional findings.  ROS:  All other systems negative, except as noted in the HPI. Review of Systems  Objective: Vital Signs: There were no vitals taken for this visit.  Specialty Comments:  No specialty comments available.  PMFS History: Patient Active Problem List   Diagnosis Date Noted   Abscess of right lower leg    Severe protein-calorie malnutrition ([place])    PVD (peripheral vascular disease) ([place])    Fall    Atrial fibrillation ([place])    Goals of care, counseling/discussion    Anemia    Fluid collection (edema) in the arms, legs, hands and feet    Cellulitis of right lower extremity 02/06/2022   Chest pain, rule out acute myocardial infarction 03/16/2021   HTN (hypertension) 03/16/2021   Hypothyroidism  03/16/2021   HLD (hyperlipidemia) 03/16/2021   Aortic valve stenosis 03/16/2021   Chronic venous insufficiency of lower extremity 03/16/2021   Short-term memory loss 12/13/2019   Psoriatic arthritis ([place]) 12/13/2019   Primary osteoarthritis of right knee 07/16/2016   Eosinophilia 06/16/2015   Chronic pain disorder    Osteoarthritis    Incisional umbilical hernia, without obstruction or gangrene    Iron deficiency anemia 09/19/2012   Past Medical History:  Diagnosis Date   Anemia    iron deficiency hx.has had iron infusions before    Chronic low back pain    Chronic pain disorder    Complication of anesthesia    severe claustrophobia   Constipation    r/t use of pain meds.Takes OTC meds or eats prunes   CVA (cerebral vascular accident) ([place])    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[place])    Unsteady gait    occasionally   Urinary urgency     Family History  Problem Relation Age of Onset   Stroke Father     Past Surgical History:  Procedure Laterality Date   ABDOMINAL AORTOGRAM W/LOWER EXTREMITY N/A 02/15/2022   Procedure: ABDOMINAL AORTOGRAM W/LOWER EXTREMITY;  Surgeon: Waynetta Sandy, MD;  Location: [facility];  Service: Cardiovascular;  Laterality: N/A;    ABDOMINAL HYSTERECTOMY     ANTERIOR FUSION CERVICAL SPINE     x2 -[id number]   APPLICATION OF WOUND VAC Right 03/12/2022  Procedure: APPLICATION OF WOUND VAC;  Surgeon: Newt Minion, MD;  Location: [facility];  Service: Orthopedics;  Laterality: Right;   BUNIONECTOMY Bilateral    COLONOSCOPY     CYSTOSCOPY W/ URETEROSCOPY  2012   EYE SURGERY Bilateral    cataract /lens implant   HOLMIUM LASER APPLICATION Left [id number]   Procedure: HOLMIUM LASER APPLICATION;  Surgeon: Malka So, MD;  Location: [facility];  Service: Urology;  Laterality: Left;   I & D EXTREMITY Right 02/10/2022   Procedure: DEBRIDEMENT RIGHT LEG ABSCESS;  Surgeon: Newt Minion, MD;  Location: [facility];  Service: Orthopedics;  Laterality: Right;   I & D EXTREMITY Right 03/12/2022   Procedure: RIGHT LEG IRRIGATION AND DEBRIDEMENT EXTREMITY;  Surgeon: Newt Minion, MD;  Location: [facility];  Service: Orthopedics;  Laterality: Right;   INSERTION OF MESH N/A 07/15/2014   Procedure: INSERTION OF MESH;  Surgeon: Adin Hector, MD;  Location: [place];  Service: General;  Laterality: N/A;   JOINT REPLACEMENT Right 2012   shoulder   LAPAROSCOPIC CHOLECYSTECTOMY W/ CHOLANGIOGRAPHY  2012   Dr Ninfa Linden   NASAL SINUS SURGERY     OPEN REDUCTION INTERNAL FIXATION (ORIF) DISTAL RADIAL FRACTURE Left 03/20/2021   Procedure: OPEN REDUCTION INTERNAL FIXATION (ORIF) DISTAL RADIAL FRACTURE;  Surgeon: Marchia Bond, MD;  Location: [facility];  Service: Orthopedics;  Laterality: Left;   RADIOLOGY WITH ANESTHESIA N/A 05/09/2014   Procedure: ADULT SEDATION WITH ANESTHESIA/MRI CERVICAL SPINE WITHOUT CONTRAST;  Surgeon: Medication Radiologist, MD;  Location: [facility];  Service: Radiology;  Laterality: N/A;  DR. HAWKS/MRI   right knee arthroscopy     d/t meniscal tear   SHOULDER ARTHROSCOPY W/ ROTATOR CUFF REPAIR Bilateral three times each over several yrs   SKIN FULL THICKNESS GRAFT Right 03/12/2022   Procedure: SKIN GRAFT FULL THICKNESS;  Surgeon: Newt Minion, MD;  Location: [facility];  Service: Orthopedics;  Laterality: Right;   SKIN SPLIT GRAFT Right 02/17/2022   Procedure: RIGHT LEG SKIN GRAFT;  Surgeon: Newt Minion, MD;  Location: [facility];  Service: Orthopedics;  Laterality: Right;   THUMB ARTHROSCOPY Left    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   Procedure: RIGHT TOTAL KNEE ARTHROPLASTY;  Surgeon: Dorna Leitz, MD;  Location: [facility];  Service: Orthopedics;  Laterality: Right;   UMBILICAL HERNIA REPAIR N/A 07/15/2014   Procedure: LAPAROSCOPIC UMBILICAL AND INFRAUMBILICAL HERNIA;  Surgeon: Adin Hector, MD;  Location: [facility];  Service: General;  Laterality: N/A;   Social History   Occupational History   Not on file  Tobacco Use   Smoking status: Former    Packs/day: 0.50    Years: 2.00    Additional pack years: 0.00    Total pack years: 1.00    Types: Cigarettes    Quit date: 02/07/1992    Years since quitting: 31.0   Smokeless tobacco: Never  Vaping Use   Vaping Use: Never used  Substance and Sexual Activity   Alcohol use: No   Drug use: No   Sexual activity: Not on file

## 2023-02-22 ENCOUNTER — Telehealth: Payer: Self-pay | Admitting: Orthopedic Surgery

## 2023-02-22 NOTE — Telephone Encounter (Signed)
[facility] Faxed over a order for wound care supplies they need the form filled a new assessment is needed and faxed back to [phone]

## 2023-02-22 NOTE — Telephone Encounter (Signed)
Form received and measurements added to Isabel Barnes to sign.

## 2023-02-23 ENCOUNTER — Telehealth: Payer: Self-pay | Admitting: Orthopedic Surgery

## 2023-02-23 NOTE — Telephone Encounter (Signed)
Patient waiting on confirmation on wound care supplies needed. Please call [phone].  Please reply to [facility].Marland Kitchen

## 2023-02-23 NOTE — Telephone Encounter (Signed)
Just received the order yesterday this was signed by Junie Panning today.

## 2023-03-15 ENCOUNTER — Ambulatory Visit: Payer: Medicare Other | Admitting: Orthopedic Surgery

## 2023-03-15 DIAGNOSIS — I890 Lymphedema, not elsewhere classified: Secondary | ICD-10-CM | POA: Diagnosis not present

## 2023-03-15 DIAGNOSIS — I87332 Chronic venous hypertension (idiopathic) with ulcer and inflammation of left lower extremity: Secondary | ICD-10-CM | POA: Diagnosis not present

## 2023-03-15 DIAGNOSIS — L97912 Non-pressure chronic ulcer of unspecified part of right lower leg with fat layer exposed: Secondary | ICD-10-CM | POA: Diagnosis not present

## 2023-03-20 ENCOUNTER — Encounter: Payer: Self-pay | Admitting: Orthopedic Surgery

## 2023-03-20 NOTE — Progress Notes (Signed)
Office Visit Note   Patient: Isabel Barnes           Date of Birth: 03-04-1941           MRN: [id number] Visit Date: 03/15/2023              Requested by: Jamal Collin, PA-C [address] [place],  [place] [address] PCP: Jamal Collin, PA-C  Chief Complaint  Patient presents with   Right Leg - Follow-up    4/41/2023 I&D RLE and graft   Left Leg - Edema      HPI: Patient is an 82 year old woman with bilateral lower extremity venous and lymphatic insufficiency ulcers.  She is currently using Silvadene.  Patient is status post tissue grafting 1 year ago.  Assessment & Plan: Visit Diagnoses:  1. Lymphedema   2. 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 Chronic ulcer of right leg, with fat layer exposed     Plan: Recommended exercise elevation compression and increase protein intake.  Follow-Up Instructions: Return in about 4 weeks (around 04/12/2023).   Ortho Exam  Patient is alert, oriented, no adenopathy, well-dressed, normal affect, normal respiratory effort. Examination patient has significant venous and lymphatic insufficiency swelling both lower extremities.  The right medial calf ulcer measures 13 x 7 cm and the left lateral calf ulcer measures 3 cm in diameter.  There is healthy granulation tissue over both wounds.  Imaging: No results found. No images are attached to the encounter.  Labs: Lab Results  Component Value Date   ESRSEDRATE 40 (H) 02/07/2022   CRP 20.5 (H) 02/07/2022   REPTSTATUS 02/16/2022 FINAL 02/10/2022   GRAMSTAIN  02/10/2022    RARE WBC PRESENT, PREDOMINANTLY PMN NO ORGANISMS SEEN    CULT  02/10/2022    RARE ENTEROCOCCUS FAECALIS RARE BACTEROIDES SPECIES NOT FRAGILIS BETA LACTAMASE POSITIVE Performed at [facility], [address], [place], [place] [address]    [facility] ENTEROCOCCUS FAECALIS 02/10/2022     Lab Results  Component Value Date   ALBUMIN 1.5 (L)  02/09/2022   ALBUMIN 2.1 (L) 02/06/2022   ALBUMIN 3.7 06/25/2017    Lab Results  Component Value Date   MG 2.0 02/12/2022   MG 1.8 02/07/2022   Lab Results  Component Value Date   VD25OH 69.88 02/07/2022    No results found for: "PREALBUMIN"    Latest Ref Rng & Units 03/16/2022   12:00 AM 03/04/2022   12:00 AM 02/25/2022    1:16 AM  CBC EXTENDED  WBC  6.9     5.7     10.0   RBC 3.87 - 5.11 2.96     3.43     2.98   Hemoglobin 12.0 - 16.0 8.6     9.8     8.2   HCT 36 - 46 26     29     26.7   Platelets 150 - 400 K/uL 272     420     375   NEUT#   3.70     6.3   Lymph# 0.7 - 4.0 K/uL   2.1      This result is from an external source.     There is no height or weight on file to calculate BMI.  Orders:  No orders of the defined types were placed in this encounter.  No orders of the defined types were placed in this encounter.  Procedures: No procedures performed  Clinical Data: No additional findings.  ROS:  All other systems negative, except as noted in the HPI. Review of Systems  Objective: Vital Signs: There were no vitals taken for this visit.  Specialty Comments:  No specialty comments available.  PMFS History: Patient Active Problem List   Diagnosis Date Noted   Abscess of right lower leg    Severe protein-calorie malnutrition    PVD (peripheral vascular disease)    Fall    Atrial fibrillation    Goals of care, counseling/discussion    Anemia    Fluid collection (edema) in the arms, legs, hands and feet    Cellulitis of right lower extremity 02/06/2022   Chest pain, rule out acute myocardial infarction 03/16/2021   HTN (hypertension) 03/16/2021   Hypothyroidism 03/16/2021   HLD (hyperlipidemia) 03/16/2021   Aortic valve stenosis 03/16/2021   Chronic venous insufficiency of lower extremity 03/16/2021   Short-term memory loss 12/13/2019   Psoriatic arthritis 12/13/2019   Primary osteoarthritis of right knee 07/16/2016   Eosinophilia  06/16/2015   Chronic pain disorder    Osteoarthritis    Incisional umbilical hernia, without obstruction or gangrene    Iron deficiency anemia 09/19/2012   Past Medical History:  Diagnosis Date   Anemia    iron deficiency hx.has had iron infusions before    Chronic low back pain    Chronic pain disorder    Complication of anesthesia    severe claustrophobia   Constipation    r/t use of pain meds.Takes OTC meds or eats prunes   CVA (cerebral vascular accident)    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 Albuterol inhaler as needed   Spinal headache 1991   blood patch placed   Spondylitis    Unsteady gait    occasionally   Urinary urgency     Family History  Problem Relation Age of Onset   Stroke Father     Past Surgical History:  Procedure Laterality Date   ABDOMINAL AORTOGRAM W/LOWER EXTREMITY N/A 02/15/2022   Procedure: ABDOMINAL AORTOGRAM W/LOWER EXTREMITY;  Surgeon: Maeola Harman, MD;  Location: [facility];  Service: Cardiovascular;  Laterality: N/A;   ABDOMINAL HYSTERECTOMY     ANTERIOR FUSION CERVICAL SPINE     x2 -C4-7   APPLICATION OF WOUND VAC Right 03/12/2022   Procedure: APPLICATION OF WOUND VAC;  Surgeon: Nadara Mustard, MD;  [facility];  Service: Orthopedics;  Laterality: Right;   BUNIONECTOMY Bilateral    COLONOSCOPY     CYSTOSCOPY W/  URETEROSCOPY  2012   EYE SURGERY Bilateral    cataract /lens implant   HOLMIUM LASER APPLICATION Left 02/08/2013  Procedure: HOLMIUM LASER APPLICATION;  Surgeon: Anner Crete, MD;  Location: [facility];  Service: Urology;  Laterality: Left;   I & D EXTREMITY Right 02/10/2022   Procedure: DEBRIDEMENT RIGHT LEG ABSCESS;  Surgeon: Nadara Mustard, MD;  Location: [facility];  Service: Orthopedics;  Laterality: Right;   I & D EXTREMITY Right 03/12/2022   Procedure: RIGHT LEG IRRIGATION AND DEBRIDEMENT EXTREMITY;  Surgeon: Nadara Mustard, MD;  Location: [facility];  Service: Orthopedics;  Laterality: Right;   INSERTION OF MESH N/A 07/15/2014   Procedure: INSERTION OF MESH;  Surgeon: Ardeth Sportsman, MD;  [facility];  Service: General;  Laterality: N/A;   JOINT REPLACEMENT Right 2012   shoulder   LAPAROSCOPIC CHOLECYSTECTOMY W/ CHOLANGIOGRAPHY  2012   Dr Magnus Ivan   NASAL SINUS SURGERY     OPEN REDUCTION INTERNAL FIXATION (ORIF) DISTAL RADIAL FRACTURE Left 03/20/2021   Procedure: OPEN REDUCTION INTERNAL FIXATION (ORIF) DISTAL RADIAL FRACTURE;  Surgeon: Teryl Lucy, MD;  [facility];  Service: Orthopedics;  Laterality: Left;   RADIOLOGY WITH ANESTHESIA N/A 05/09/2014   Procedure: ADULT SEDATION WITH ANESTHESIA/MRI CERVICAL SPINE WITHOUT CONTRAST;  Surgeon: Medication Radiologist, MD;  [facility];  Service: Radiology;  Laterality: N/A;  DR. HAWKS/MRI   right knee arthroscopy     d/t meniscal tear   SHOULDER ARTHROSCOPY W/ ROTATOR CUFF REPAIR Bilateral three times each over several yrs   SKIN FULL THICKNESS GRAFT Right 03/12/2022   Procedure: SKIN GRAFT FULL THICKNESS;  Surgeon: Nadara Mustard, MD;  Location[facility];  Service: Orthopedics;  Laterality: Right;   SKIN SPLIT GRAFT Right 02/17/2022   Procedure: RIGHT LEG SKIN GRAFT;  Surgeon: Nadara Mustard, MD;  Location: [facility];  Service: Orthopedics;  Laterality: Right;   THUMB ARTHROSCOPY Left    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    Procedure: RIGHT TOTAL KNEE ARTHROPLASTY;  Surgeon: Jodi Geralds, MD;  [facility];  Service: Orthopedics;  Laterality: Right;   UMBILICAL HERNIA REPAIR N/A 07/15/2014   Procedure: LAPAROSCOPIC UMBILICAL AND INFRAUMBILICAL HERNIA;  Surgeon: Ardeth Sportsman, MD;  [facility];  Service: General;  Laterality: N/A;   Social History   Occupational History   Not on file  Tobacco Use   Smoking status: Former    Packs/day: 0.50    Years: 2.00    Additional pack years: 0.00    Total pack years: 1.00    Types: Cigarettes    Quit date: 02/07/1992    Years since quitting: 31.1   Smokeless tobacco: Never  Vaping Use   Vaping Use: Never used  Substance and Sexual Activity   Alcohol use: No   Drug use: No   Sexual activity: Not on file

## 2023-04-18 ENCOUNTER — Ambulatory Visit: Payer: Medicare Other | Admitting: Orthopedic Surgery

## 2023-04-18 ENCOUNTER — Encounter: Payer: Self-pay | Admitting: Orthopedic Surgery

## 2023-04-18 DIAGNOSIS — I87332 Chronic venous hypertension (idiopathic) with ulcer and inflammation of left lower extremity: Secondary | ICD-10-CM | POA: Diagnosis not present

## 2023-04-18 DIAGNOSIS — L97912 Non-pressure chronic ulcer of unspecified part of right lower leg with fat layer exposed: Secondary | ICD-10-CM | POA: Diagnosis not present

## 2023-04-18 DIAGNOSIS — I890 Lymphedema, not elsewhere classified: Secondary | ICD-10-CM | POA: Diagnosis not present

## 2023-04-18 IMAGING — CT CT HEAD W/O CM
4 series · 16 of 47 positions shown, 18 images · non-contrast
Comparison: 12/22/2020 and prior CTs

CLINICAL DATA: 80-year-old female with head injury and altered
mental status.



[Series 3: head without · axial · non-contrast · 0.36mm/px · z∈[-74,+46]mm · 7 of 33 slices shown, 9 images]
[im 5/33  brain]
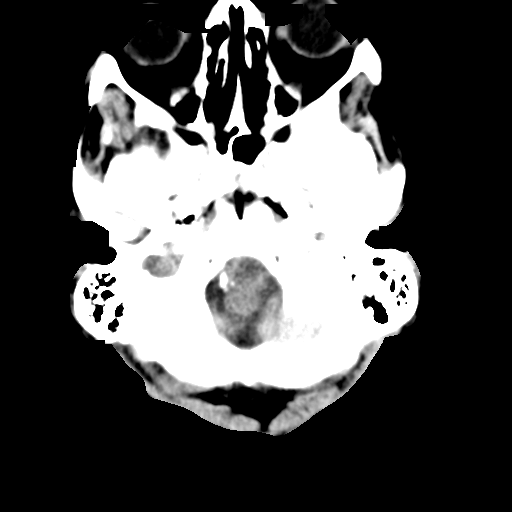
[im 5/33  bone]
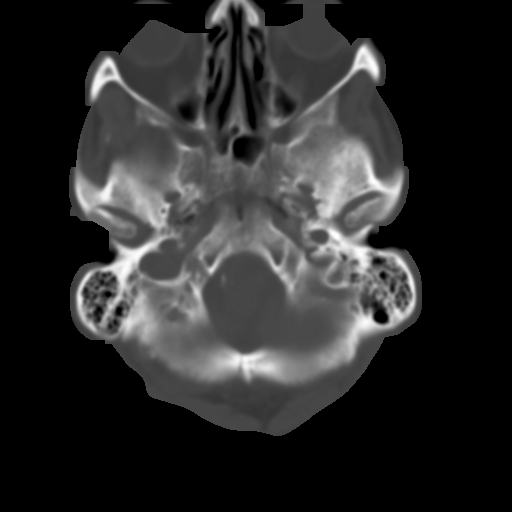
[im 9/33  brain]
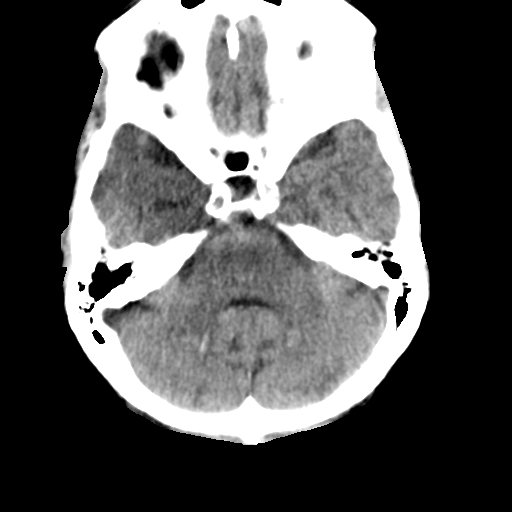
[im 13/33  brain]
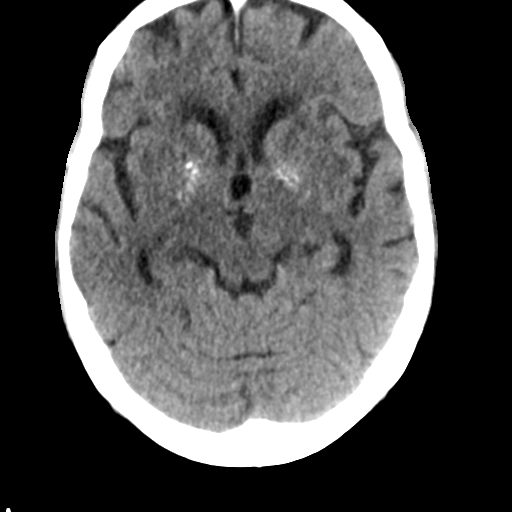
[im 17/33  brain]
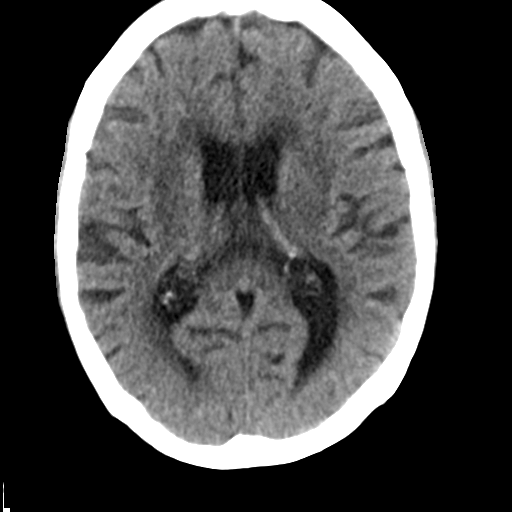
[im 21/33  brain]
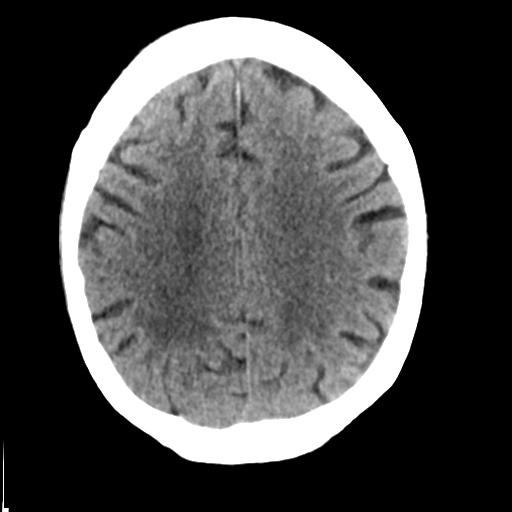
[im 21/33  bone]
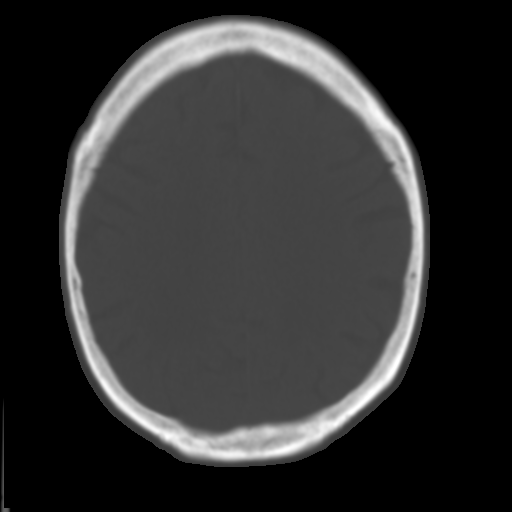
[im 25/33  brain]
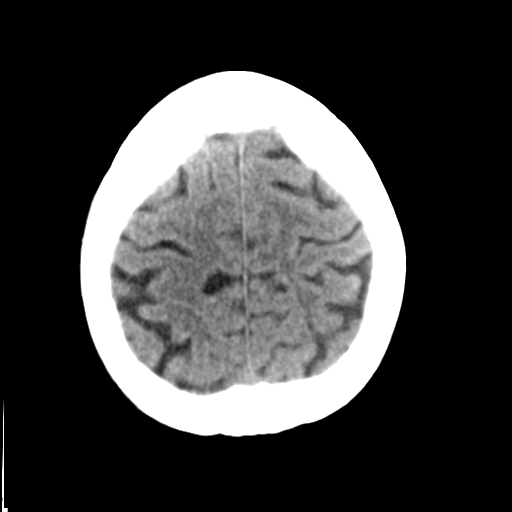
[im 29/33  brain]
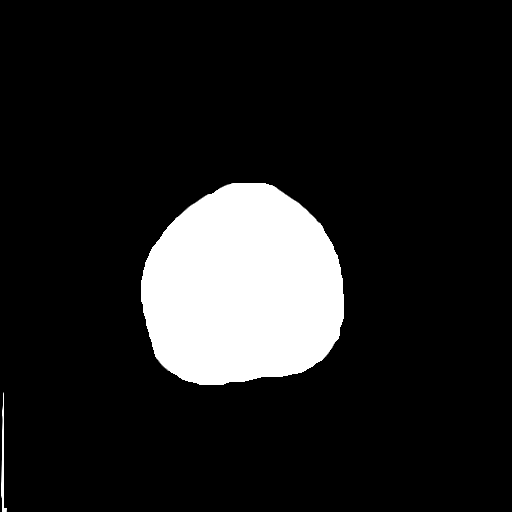

[Series 4: ax head bone · axial · 0.36mm/px · z∈[-73,-42]mm · 3 of 81 slices shown]
[im 9/81  bone]
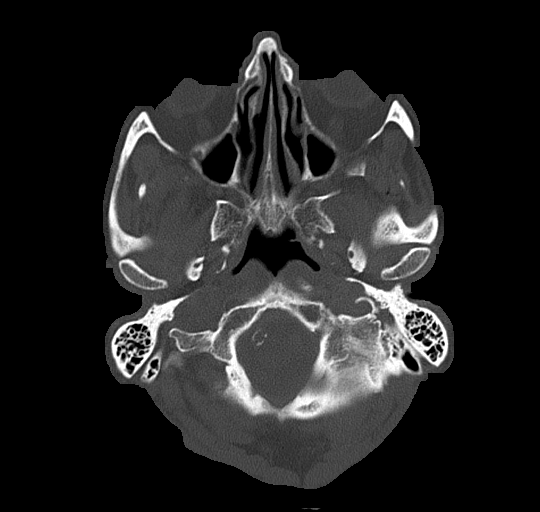
[im 17/81  bone]
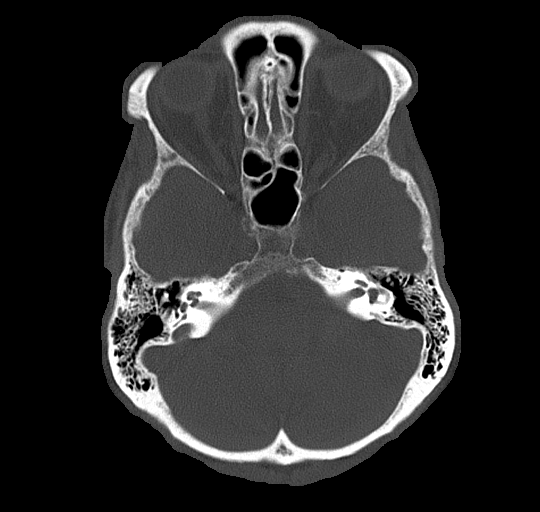
[im 25/81  bone]
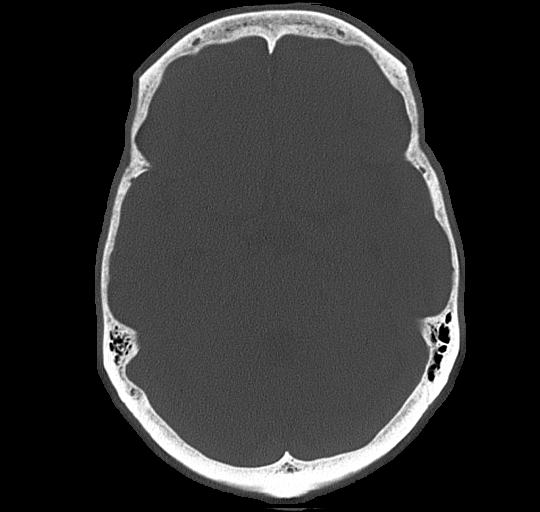

[Series 5: head without cor · coronal · non-contrast · 0.32mm/px · 3 of 62 slices shown]
[im 21/62  brain]
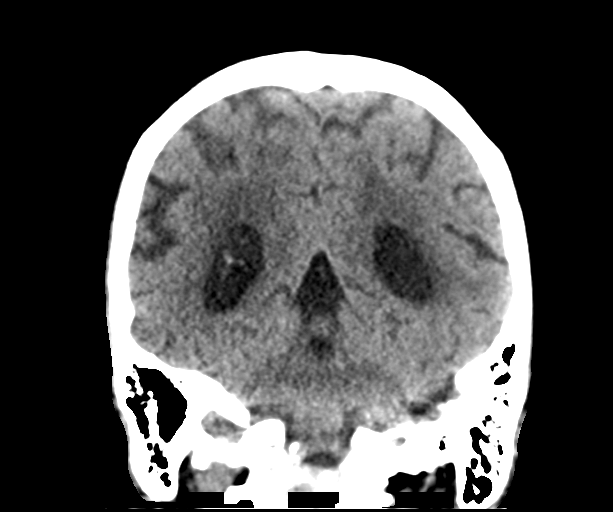
[im 28/62  brain]
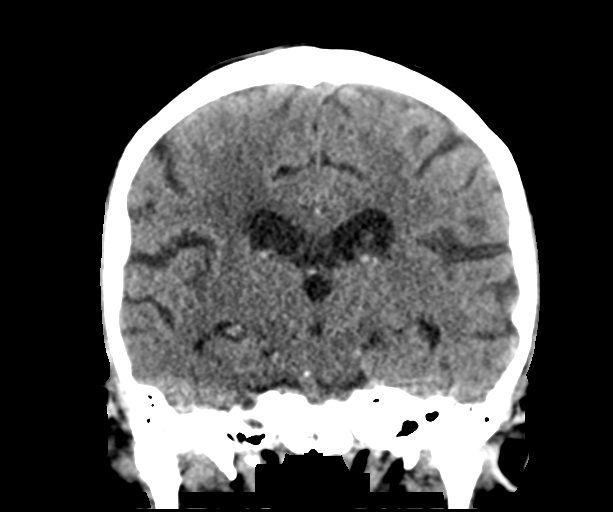
[im 34/62  brain]
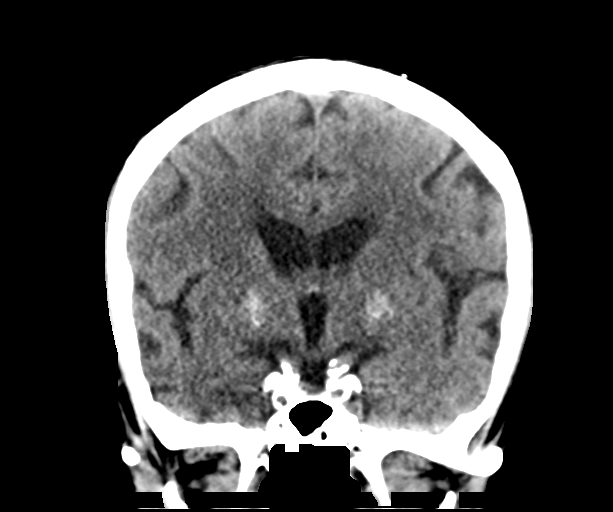

[Series 6: head without sag · sagittal · non-contrast · 0.32mm/px · 3 of 48 slices shown]
[im 16/48  brain]
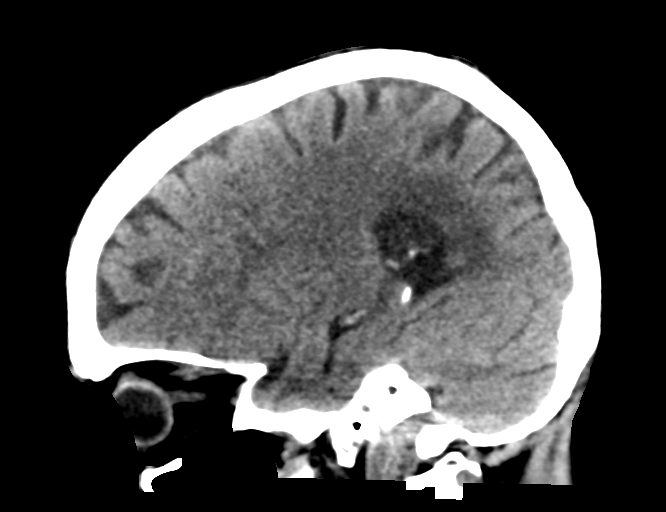
[im 24/48  brain]
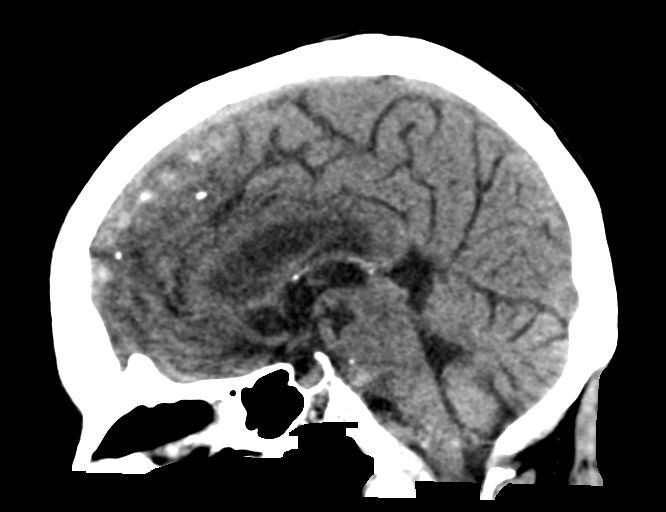
[im 32/48  brain]
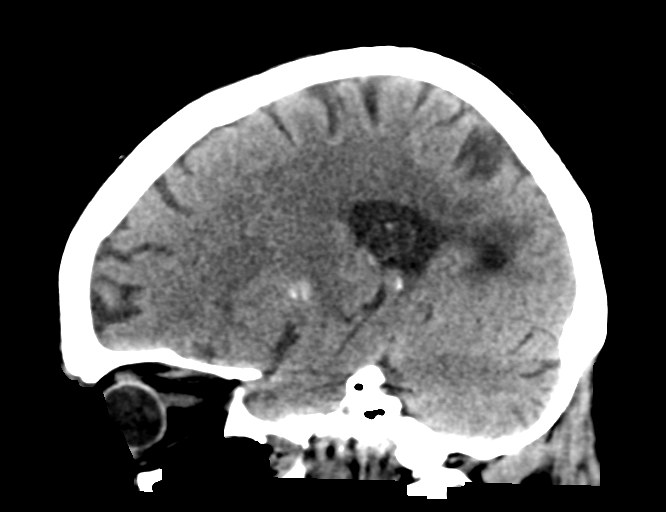

[16 of 47 positions shown; findings below may reference images not displayed]

FINDINGS: Brain: No evidence of acute infarction, hemorrhage, hydrocephalus,
extra-axial collection or mass lesion/mass effect. Atrophy, chronic
small-vessel white matter ischemic changes and remote RIGHT
cerebellar infarct are identified.

Vascular: Carotid and vertebral atherosclerotic calcifications are
noted.

Skull: Normal. Negative for fracture or focal lesion.

Sinuses/Orbits: No acute finding.

Other: None.
IMPRESSION: 1. No evidence of acute intracranial abnormality.
2. Atrophy, chronic small-vessel white matter ischemic changes and
remote RIGHT cerebellar infarct.

## 2023-04-18 NOTE — Progress Notes (Signed)
Office Visit Note   Patient: Isabel Barnes           Date of Birth: 1941/11/14           MRN: [id number] Visit Date: 04/18/2023              Requested by: Jamal Collin, PA-C 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  Patient states the calluses on the right foot are healing.  She states she is developing a new ulcer on the right heel.   Assessment & Plan: Visit Diagnoses:  1. Lymphedema   2. 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 Chronic ulcer of right leg, with fat layer exposed (HCC)     Plan: Recommended she use her relieving donuts on the heel as well as the right great toe.  Again reviewed the importance of decreasing edema for the chronic leg ulcer to heal.  Follow-Up Instructions: Return in about 2 months (around 06/18/2023).   Ortho Exam  Patient is alert, oriented, no adenopathy, well-dressed, normal affect, normal respiratory effort. Examination the ulcer beneath the right great toe and first metatarsal head is completely healed with pressure offloading she is starting developing new ulcer on the right heel.  The venous lymphatic ulcer on the right leg is 7 x 14 cm.  The wound is completely covered with fibrinous exudative tissue.  There is no cellulitis odor or drainage no maceration.  Imaging: No results found.    Labs: Lab Results  Component Value Date   ESRSEDRATE 40 (H) 02/07/2022   CRP 20.5 (H) 02/07/2022   REPTSTATUS 02/16/2022 FINAL 02/10/2022   GRAMSTAIN  02/10/2022    RARE WBC PRESENT, PREDOMINANTLY PMN NO ORGANISMS SEEN    CULT  02/10/2022    RARE ENTEROCOCCUS FAECALIS RARE BACTEROIDES SPECIES NOT FRAGILIS BETA LACTAMASE  POSITIVE Performed at [facility], [address], [place], [place] [address]    [facility] ENTEROCOCCUS FAECALIS 02/10/2022     Lab Results  Component Value Date   ALBUMIN 1.5 (L) 02/09/2022   ALBUMIN 2.1 (L) 02/06/2022   ALBUMIN 3.7 06/25/2017    Lab Results  Component Value Date   MG 2.0 02/12/2022   MG 1.8 02/07/2022   Lab Results  Component Value Date   VD25OH 69.88 02/07/2022    No results found for: "PREALBUMIN"    Latest Ref Rng & Units 03/16/2022   12:00 AM 03/04/2022   12:00 AM 02/25/2022    1:16 AM  CBC EXTENDED  WBC  6.9     5.7     10.0   RBC 3.87 - 5.11 2.96     3.43     2.98   Hemoglobin 12.0 - 16.0 8.6     9.8     8.2   HCT 36 - 46 26     29     26.7   Platelets 150 - 400 K/uL 272     420     375   NEUT#   3.70     6.3   Lymph# 0.7 - 4.0 K/uL   2.1      This  result is from an external source.     There is no height or weight on file to calculate BMI.  Orders:  No orders of the defined types were placed in this encounter.  No orders of the defined types were placed in this encounter.    Procedures: No procedures performed  Clinical Data: No additional findings.  ROS:  All other systems negative, except as noted in the HPI. Review of Systems  Objective: Vital Signs: There were no vitals taken for this visit.  Specialty Comments:  No specialty comments available.  PMFS History: Patient Active Problem List   Diagnosis Date Noted   Abscess of right lower leg    Severe protein-calorie malnutrition (HCC)    PVD (peripheral vascular disease) (HCC)    Fall    Atrial fibrillation (HCC)    Goals of care, counseling/discussion    Anemia    Fluid collection (edema) in the arms, legs, hands and feet    Cellulitis of right lower extremity 02/06/2022   Chest pain, rule out acute myocardial infarction 03/16/2021   HTN (hypertension) 03/16/2021   Hypothyroidism 03/16/2021   HLD (hyperlipidemia) 03/16/2021   Aortic valve stenosis  03/16/2021   Chronic venous insufficiency of lower extremity 03/16/2021   Short-term memory loss 12/13/2019   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   Primary osteoarthritis of right knee 07/16/2016   Eosinophilia 06/16/2015   Chronic pain disorder    Osteoarthritis    Incisional umbilical hernia, without obstruction or gangrene    Iron deficiency anemia 09/19/2012   Past Medical History:  Diagnosis Date   Anemia    iron deficiency hx.has had iron infusions before    Chronic low back pain    Chronic pain disorder    Complication of anesthesia    severe claustrophobia   Constipation    r/t use of pain meds.Takes OTC meds or eats prunes   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HCC)    Unsteady gait    occasionally   Urinary urgency     Family History  Problem Relation Age of Onset   Stroke Father     Past Surgical History:  Procedure Laterality Date   ABDOMINAL AORTOGRAM W/LOWER EXTREMITY N/A 02/15/2022   Procedure: ABDOMINAL AORTOGRAM W/LOWER EXTREMITY;  Surgeon: Maeola Harman, MD;  Location: [facility] INVASIVE CV LAB;  Service: Cardiovascular;  Laterality: N/A;   ABDOMINAL HYSTERECTOMY     ANTERIOR FUSION CERVICAL SPINE     x2 -C4-7    APPLICATION OF WOUND VAC Right 03/12/2022  Procedure: APPLICATION OF WOUND VAC;  Surgeon: Nadara Mustard, MD;  Location: [facility] OR;  Service: Orthopedics;  Laterality: Right;   BUNIONECTOMY Bilateral    COLONOSCOPY     CYSTOSCOPY W/ URETEROSCOPY  2012   EYE SURGERY Bilateral    cataract /lens implant   HOLMIUM LASER APPLICATION Left 02/08/2013   Procedure: HOLMIUM LASER APPLICATION;  Surgeon: Anner Crete, MD;  Location: [facility];  Service: Urology;  Laterality: Left;   I & D EXTREMITY Right 02/10/2022   Procedure: DEBRIDEMENT RIGHT LEG ABSCESS;  Surgeon: Nadara Mustard, MD;  Location: [facility] OR;  Service: Orthopedics;  Laterality: Right;   I & D EXTREMITY Right 03/12/2022   Procedure: RIGHT LEG IRRIGATION AND DEBRIDEMENT EXTREMITY;  Surgeon: Nadara Mustard, MD;  Location: [facility] OR;  Service: Orthopedics;  Laterality: Right;   INSERTION OF MESH N/A 07/15/2014   Procedure: INSERTION OF MESH;  Surgeon: Ardeth Sportsman, MD;  Location: [facility] OR;  Service: General;  Laterality: N/A;   JOINT REPLACEMENT Right 2012   shoulder   LAPAROSCOPIC CHOLECYSTECTOMY W/ CHOLANGIOGRAPHY  2012   Dr Magnus Ivan   NASAL SINUS SURGERY     OPEN REDUCTION INTERNAL FIXATION (ORIF) DISTAL RADIAL FRACTURE Left 03/20/2021   Procedure: OPEN REDUCTION INTERNAL FIXATION (ORIF) DISTAL RADIAL FRACTURE;  Surgeon: Teryl Lucy, MD;  Location: [facility] OR;  Service: Orthopedics;  Laterality: Left;   RADIOLOGY WITH ANESTHESIA N/A 05/09/2014   Procedure: ADULT SEDATION WITH ANESTHESIA/MRI CERVICAL SPINE WITHOUT CONTRAST;  Surgeon: Medication Radiologist, MD;  Location: [facility] OR;  Service: Radiology;  Laterality: N/A;  DR. HAWKS/MRI   right knee arthroscopy     d/t meniscal tear   SHOULDER ARTHROSCOPY W/ ROTATOR CUFF REPAIR Bilateral three times each over several yrs   SKIN FULL THICKNESS GRAFT Right 03/12/2022   Procedure: SKIN GRAFT FULL THICKNESS;  Surgeon: Nadara Mustard, MD;  Location: [facility] OR;  Service: Orthopedics;  Laterality: Right;    SKIN SPLIT GRAFT Right 02/17/2022   Procedure: RIGHT LEG SKIN GRAFT;  Surgeon: Nadara Mustard, MD;  Location: [facility] OR;  Service: Orthopedics;  Laterality: Right;   THUMB ARTHROSCOPY Left    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   Procedure: RIGHT TOTAL KNEE ARTHROPLASTY;  Surgeon: Jodi Geralds, MD;  Location: [facility] OR;  Service: Orthopedics;  Laterality: Right;   UMBILICAL HERNIA REPAIR N/A 07/15/2014   Procedure: LAPAROSCOPIC UMBILICAL AND INFRAUMBILICAL HERNIA;  Surgeon: Ardeth Sportsman, MD;  Location: [facility] OR;  Service: General;  Laterality: N/A;   Social History   Occupational History   Not on file  Tobacco Use   Smoking status: Former    Packs/day: 0.50    Years: 2.00    Additional pack years: 0.00    Total pack years: 1.00    Types: Cigarettes    Quit date: 02/07/1992    Years since quitting: 31.2   Smokeless tobacco: Never  Vaping Use   Vaping Use: Never used  Substance and Sexual Activity   Alcohol use: No   Drug use: No   Sexual activity: Not on file

## 2023-04-19 IMAGING — US US EXTREM LOW*R* LIMITED
1 series · 14 of 19 positions shown · non-contrast
Comparison: None.

CLINICAL DATA: Concern for hematoma versus abscess lower extremity

EXAM:
ULTRASOUND RIGHT LOWER EXTREMITY LIMITED
TECHNIQUE: Ultrasound examination of the lower extremity soft tissues was
performed in the area of clinical concern.

[Series 1: us soft tissue lower extremity limited right (non- · 19 acquisitions, 14 frames shown]
[im 1/19]
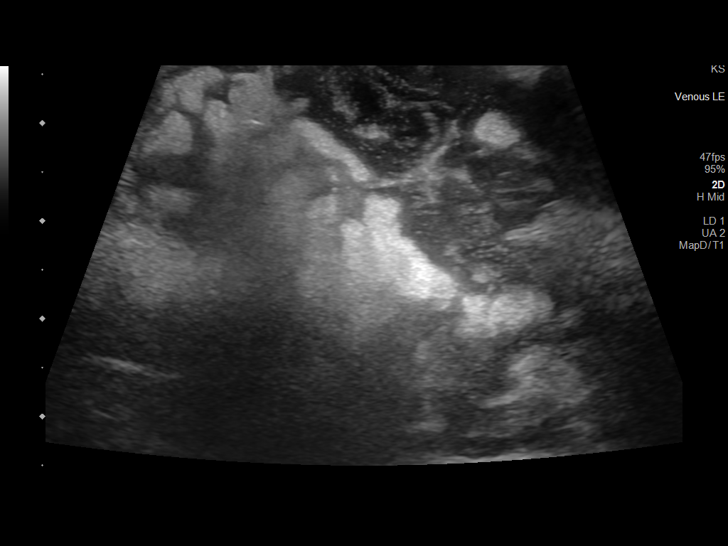
[im 3/19]
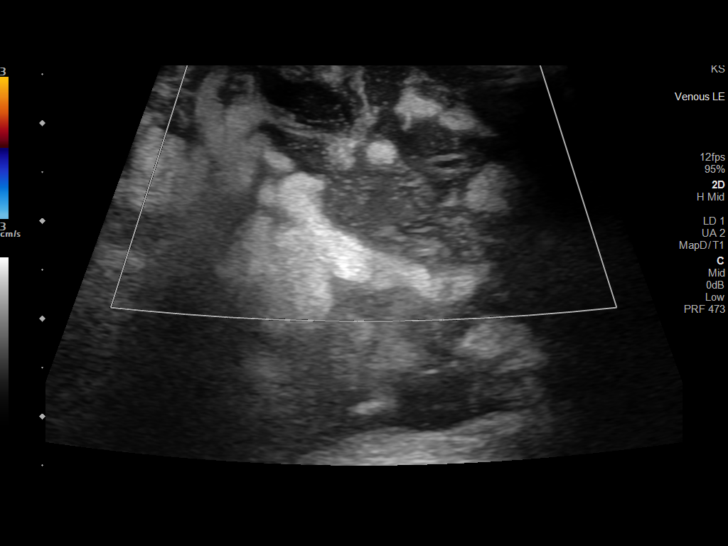
[im 4/19]
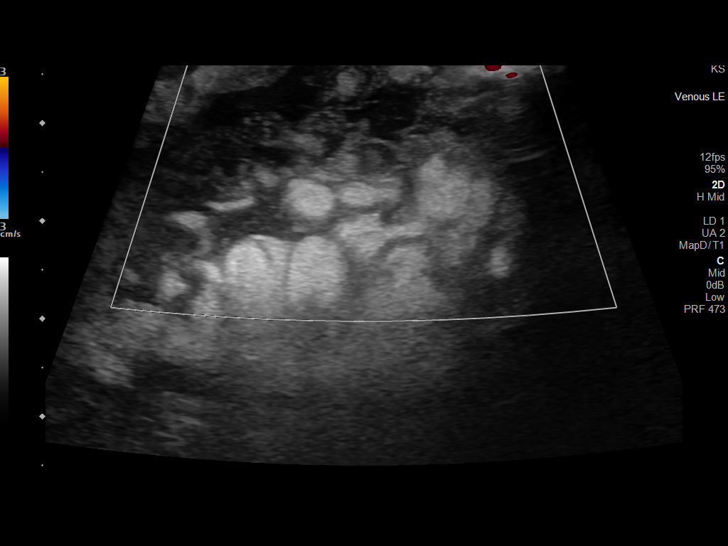
[im 5/19]
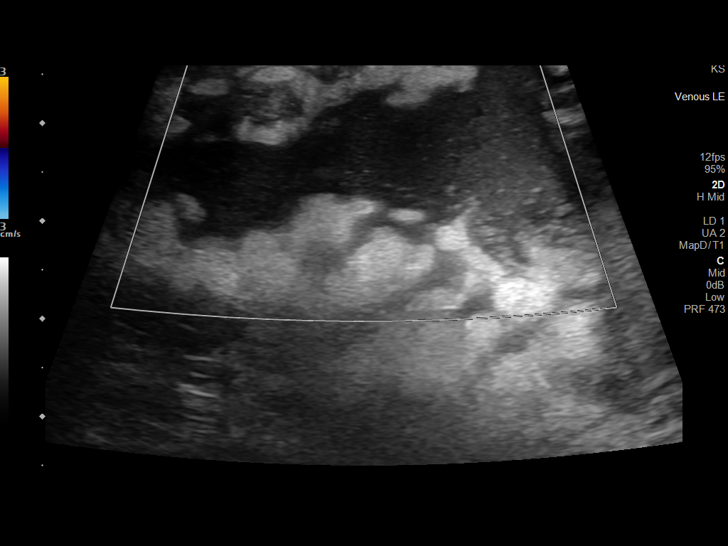
[im 7/19]
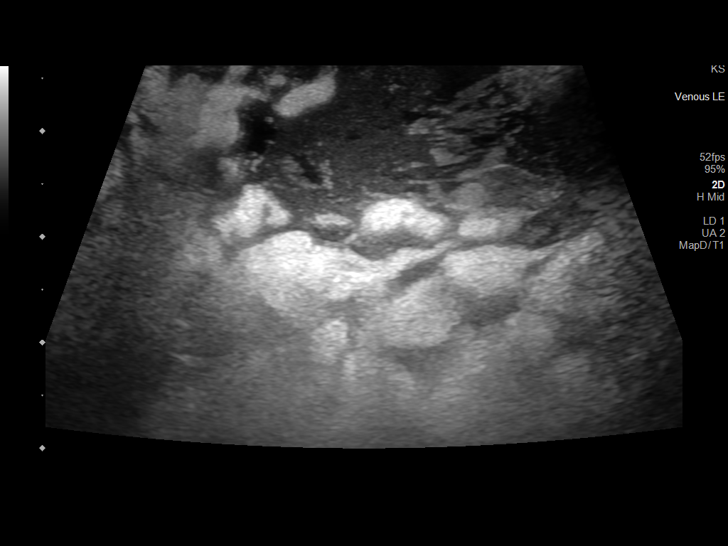
[im 8/19]
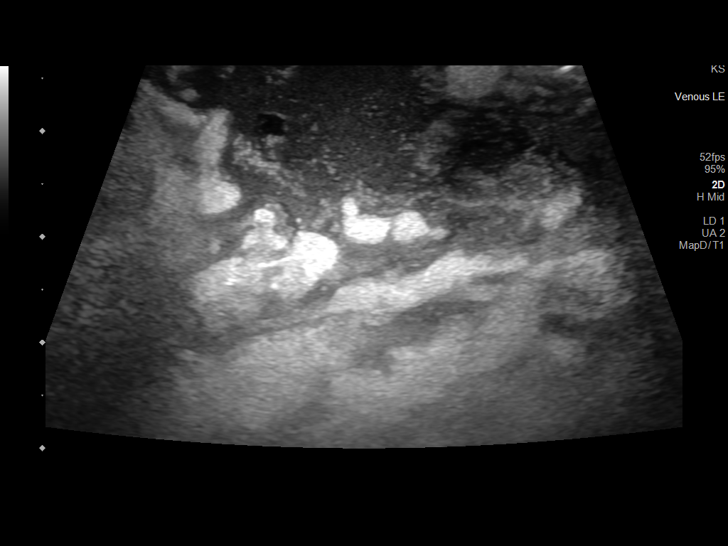
[im 9/19]
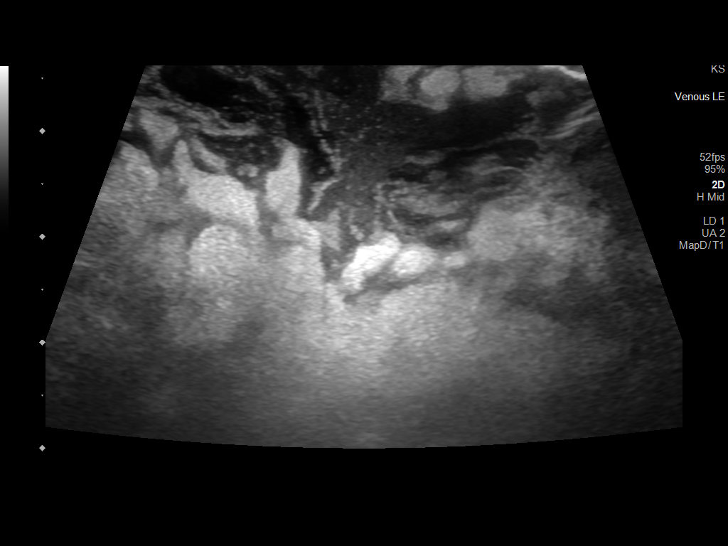
[im 11/19]
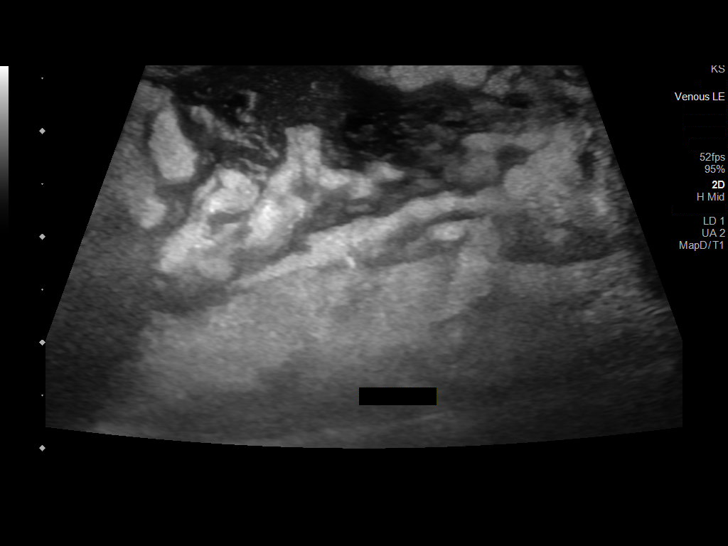
[im 12/19]
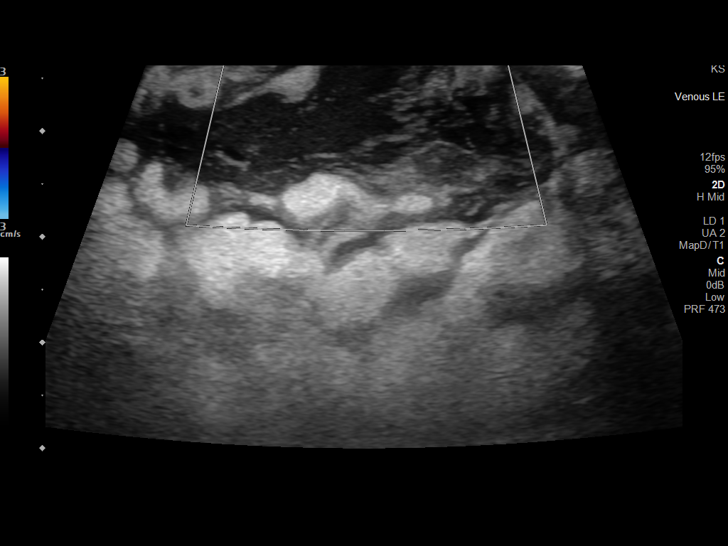
[im 13/19]
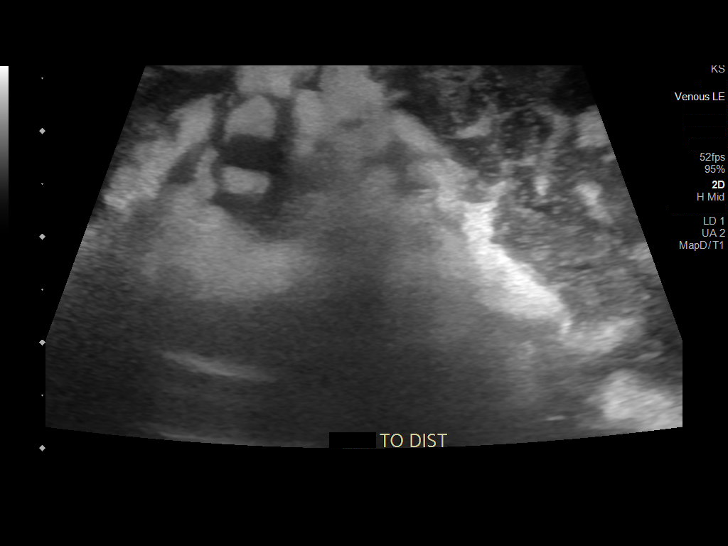
[im 15/19]
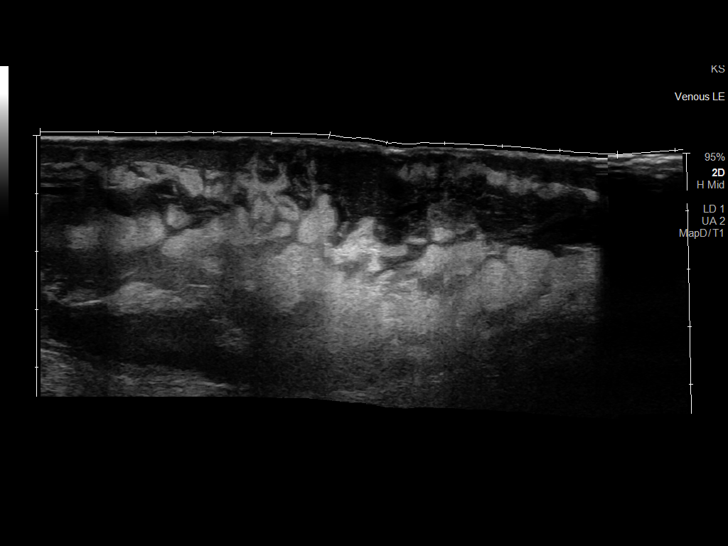
[im 16/19]
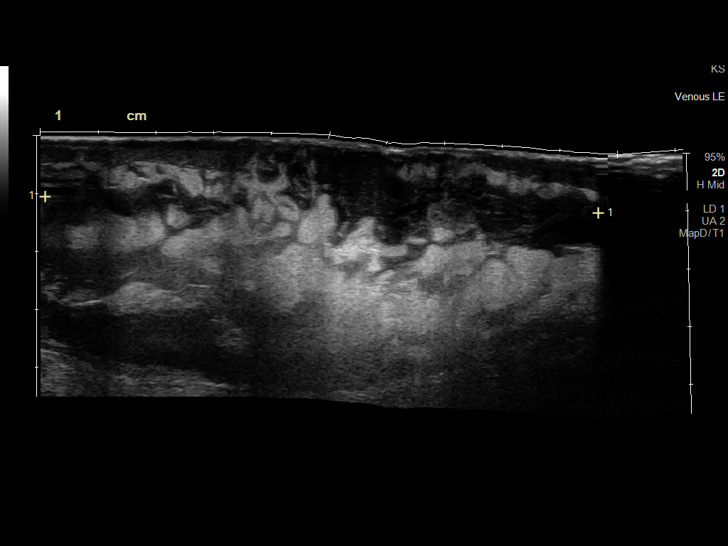
[im 17/19]
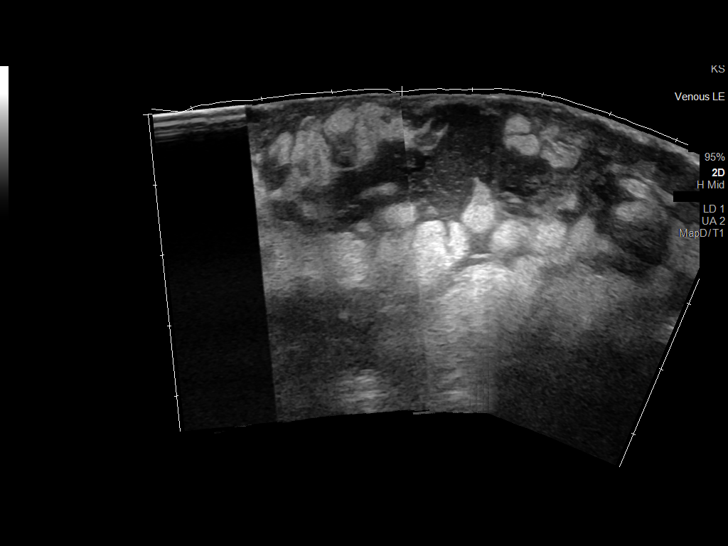
[im 19/19]
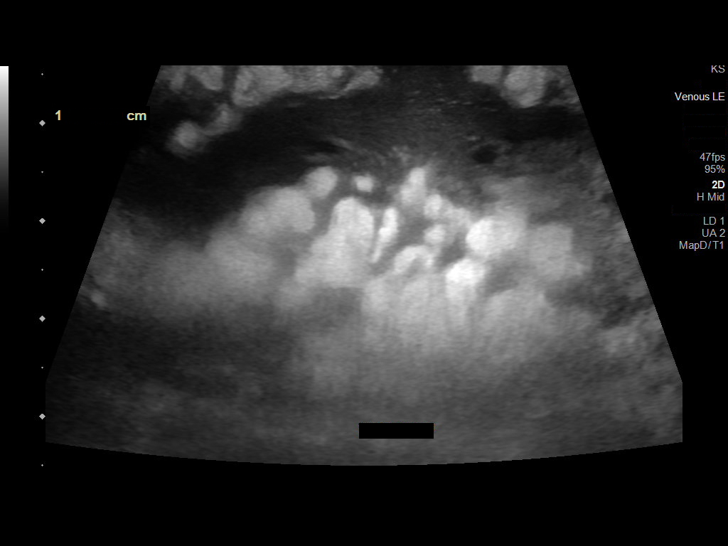

[14 of 19 positions shown; findings below may reference images not displayed]

FINDINGS: Extensive edema within the right medial calf in the area of concern.
No well-defined wall to suggest abscess.
IMPRESSION: Extensive edema within the subcutaneous soft tissues in the area of
concern without well-defined abscess.

## 2023-06-20 ENCOUNTER — Ambulatory Visit: Payer: Medicare Other | Admitting: Orthopedic Surgery

## 2023-06-20 ENCOUNTER — Encounter: Payer: Self-pay | Admitting: Orthopedic Surgery

## 2023-06-20 DIAGNOSIS — I87332 Chronic venous hypertension (idiopathic) with ulcer and inflammation of left lower extremity: Secondary | ICD-10-CM

## 2023-06-20 DIAGNOSIS — L97912 Non-pressure chronic ulcer of unspecified part of right lower leg with fat layer exposed: Secondary | ICD-10-CM

## 2023-06-20 DIAGNOSIS — I890 Lymphedema, not elsewhere classified: Secondary | ICD-10-CM

## 2023-06-20 NOTE — Progress Notes (Signed)
Office Visit Note   Patient: Isabel Barnes           Date of Birth: 1941-07-10           MRN: [id number] Visit Date: 06/20/2023              Requested by: Jamal Collin, PA-C [address] [place],  [place] [address] PCP: Jamal Collin, PA-C  Chief Complaint  Patient presents with   Right Leg - Follow-up    4/41/2023 I&D RLE and graft   Left Leg - Follow-up      HPI: Patient is an 82 year old woman who was seen in follow-up for venous and lymphatic insufficiency swelling both lower extremities.  Patient has been more compliant with elevation but states she has not been able to use her lymphedema pumps.  Assessment & Plan: Visit Diagnoses:  1. Lymphedema   2. 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 Chronic ulcer of right leg, with fat layer exposed (HCC)     Plan: Recommended continue Dial soap cleansing dry dressing changes with Silvadene daily with compression.  Recommended elevation and recommended using her lymphedema pumps.  Follow-Up Instructions: Return in about 2 months (around 08/21/2023).   Ortho Exam  Patient is alert, oriented, no adenopathy, well-dressed, normal affect, normal respiratory effort. Examination there is some wrinkling of the skin of both lower extremities.  The medial right calf ulcer measures 5 x 12 cm.  There is a new ulcer over the medial aspect of the right heel that is 2 x 2 cm with maceration from drainage.  There is no cellulitis the wounds are flat without tunneling.  She has edema from the dorsum of her feet with associated maceration of the webspaces secondary to drainage.  Imaging: No results found. No images are attached to the encounter.  Labs: Lab Results  Component Value Date   ESRSEDRATE 40 (H) 02/07/2022   CRP 20.5 (H) 02/07/2022   REPTSTATUS 02/16/2022 FINAL 02/10/2022   GRAMSTAIN  02/10/2022    RARE WBC PRESENT, PREDOMINANTLY PMN NO ORGANISMS SEEN    CULT   02/10/2022    RARE ENTEROCOCCUS FAECALIS RARE BACTEROIDES SPECIES NOT FRAGILIS BETA LACTAMASE POSITIVE Performed at [facility], [address], [place], [place] [address]    [facility] ENTEROCOCCUS FAECALIS 02/10/2022     Lab Results  Component Value Date   ALBUMIN 1.5 (L) 02/09/2022   ALBUMIN 2.1 (L) 02/06/2022   ALBUMIN 3.7 06/25/2017    Lab Results  Component Value Date   MG 2.0 02/12/2022   MG 1.8 02/07/2022   Lab Results  Component Value Date   VD25OH 69.88 02/07/2022    No results found for: "PREALBUMIN"    Latest Ref Rng & Units 03/16/2022   12:00 AM 03/04/2022   12:00 AM 02/25/2022    1:16 AM  CBC EXTENDED  WBC  6.9     5.7     10.0   RBC 3.87 - 5.11 2.96     3.43     2.98   Hemoglobin 12.0 - 16.0 8.6     9.8     8.2   HCT 36 - 46 26     29     26.7   Platelets 150 - 400 K/uL 272     420     375   NEUT#   3.70     6.3   Lymph# 0.7 - 4.0  K/uL   2.1      This result is from an external source.     There is no height or weight on file to calculate BMI.  Orders:  No orders of the defined types were placed in this encounter.  No orders of the defined types were placed in this encounter.    Procedures: No procedures performed  Clinical Data: No additional findings.  ROS:  All other systems negative, except as noted in the HPI. Review of Systems  Objective: Vital Signs: There were no vitals taken for this visit.  Specialty Comments:  No specialty comments available.  PMFS History: Patient Active Problem List   Diagnosis Date Noted   Abscess of right lower leg    Severe protein-calorie malnutrition (HCC)    PVD (peripheral vascular disease) (HCC)    Fall    Atrial fibrillation (HCC)    Goals of care, counseling/discussion    Anemia    Fluid collection (edema) in the arms, legs, hands and feet    Cellulitis of right lower extremity 02/06/2022   Chest pain, rule out acute myocardial infarction 03/16/2021   HTN (hypertension)  03/16/2021   Hypothyroidism 03/16/2021   HLD (hyperlipidemia) 03/16/2021   Aortic valve stenosis 03/16/2021   Chronic venous insufficiency of lower extremity 03/16/2021   Short-term memory loss 12/13/2019   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   Primary osteoarthritis of right knee 07/16/2016   Eosinophilia 06/16/2015   Chronic pain disorder    Osteoarthritis    Incisional umbilical hernia, without obstruction or gangrene    Iron deficiency anemia 09/19/2012   Past Medical History:  Diagnosis Date   Anemia    iron deficiency hx.has had iron infusions before    Chronic low back pain    Chronic pain disorder    Complication of anesthesia    severe claustrophobia   Constipation    r/t use of pain meds.Takes OTC meds or eats prunes   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HCC)    Unsteady gait    occasionally   Urinary urgency     Family History  Problem Relation Age of Onset   Stroke Father     Past Surgical History:  Procedure Laterality Date   ABDOMINAL AORTOGRAM W/LOWER EXTREMITY N/A 02/15/2022   Procedure: ABDOMINAL AORTOGRAM W/LOWER EXTREMITY;  Surgeon: Maeola Harman, MD;  Location: [facility] INVASIVE CV LAB;  Service: Cardiovascular;   Laterality: N/A;   ABDOMINAL HYSTERECTOMY     ANTERIOR FUSION CERVICAL SPINE     x2 -C4-7  APPLICATION OF WOUND VAC Right 03/12/2022   Procedure: APPLICATION OF WOUND VAC;  Surgeon: Nadara Mustard, MD;  Location: [facility] OR;  Service: Orthopedics;  Laterality: Right;   BUNIONECTOMY Bilateral    COLONOSCOPY     CYSTOSCOPY W/ URETEROSCOPY  2012   EYE SURGERY Bilateral    cataract /lens implant   HOLMIUM LASER APPLICATION Left 02/08/2013   Procedure: HOLMIUM LASER APPLICATION;  Surgeon: Anner Crete, MD;  Location: [facility];  Service: Urology;  Laterality: Left;   I & D EXTREMITY Right 02/10/2022   Procedure: DEBRIDEMENT RIGHT LEG ABSCESS;  Surgeon: Nadara Mustard, MD;  Location: [facility] OR;  Service: Orthopedics;  Laterality: Right;   I & D EXTREMITY Right 03/12/2022   Procedure: RIGHT LEG IRRIGATION AND DEBRIDEMENT EXTREMITY;  Surgeon: Nadara Mustard, MD;  Location: [facility] OR;  Service: Orthopedics;  Laterality: Right;   INSERTION OF MESH N/A 07/15/2014   Procedure: INSERTION OF MESH;  Surgeon: Ardeth Sportsman, MD;  Location: [facility] OR;  Service: General;  Laterality: N/A;   JOINT REPLACEMENT Right 2012   shoulder   LAPAROSCOPIC CHOLECYSTECTOMY W/ CHOLANGIOGRAPHY  2012   Dr Magnus Ivan   NASAL SINUS SURGERY     OPEN REDUCTION INTERNAL FIXATION (ORIF) DISTAL RADIAL FRACTURE Left 03/20/2021   Procedure: OPEN REDUCTION INTERNAL FIXATION (ORIF) DISTAL RADIAL FRACTURE;  Surgeon: Teryl Lucy, MD;  Location: [facility] OR;  Service: Orthopedics;  Laterality: Left;   RADIOLOGY WITH ANESTHESIA N/A 05/09/2014   Procedure: ADULT SEDATION WITH ANESTHESIA/MRI CERVICAL SPINE WITHOUT CONTRAST;  Surgeon: Medication Radiologist, MD;  Location: [facility] OR;  Service: Radiology;  Laterality: N/A;  DR. HAWKS/MRI   right knee arthroscopy     d/t meniscal tear   SHOULDER ARTHROSCOPY W/ ROTATOR CUFF REPAIR Bilateral three times each over several yrs   SKIN FULL THICKNESS GRAFT Right 03/12/2022   Procedure: SKIN GRAFT FULL  THICKNESS;  Surgeon: Nadara Mustard, MD;  Location: [facility] OR;  Service: Orthopedics;  Laterality: Right;   SKIN SPLIT GRAFT Right 02/17/2022   Procedure: RIGHT LEG SKIN GRAFT;  Surgeon: Nadara Mustard, MD;  Location: [facility] OR;  Service: Orthopedics;  Laterality: Right;   THUMB ARTHROSCOPY Left    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   Procedure: RIGHT TOTAL KNEE ARTHROPLASTY;  Surgeon: Jodi Geralds, MD;  Location: [facility] OR;  Service: Orthopedics;  Laterality: Right;   UMBILICAL HERNIA REPAIR N/A 07/15/2014   Procedure: LAPAROSCOPIC UMBILICAL AND INFRAUMBILICAL HERNIA;  Surgeon: Ardeth Sportsman, MD;  Location: [facility] OR;  Service: General;  Laterality: N/A;   Social History   Occupational History   Not on file  Tobacco Use   Smoking status: Former    Current packs/day: 0.00    Average packs/day: 0.5 packs/day for 2.0 years (1.0 ttl pk-yrs)    Types: Cigarettes    Start date: 02/06/1990    Quit date: 02/07/1992    Years since quitting: 31.3   Smokeless tobacco: Never  Vaping Use   Vaping status: Never Used  Substance and Sexual Activity   Alcohol use: No   Drug use: No   Sexual activity: Not on file

## 2023-08-03 ENCOUNTER — Telehealth: Payer: Self-pay | Admitting: Orthopedic Surgery

## 2023-08-03 NOTE — Telephone Encounter (Signed)
Faxing notes and wound care order to home health at number below. Pt is washing with dial soap and water daily and applying silvadene to wound daily with compression.

## 2023-08-03 NOTE — Telephone Encounter (Signed)
Isabel Barnes (CSR) called from [facility] called requesting wound care orders. Pt had to change providers due to insurance change and [facility] is requesting also office notes along with wound care orders. Please fax to [phone]. [facility] phone number is [phone].

## 2023-08-29 ENCOUNTER — Encounter: Payer: Self-pay | Admitting: Orthopedic Surgery

## 2023-08-29 ENCOUNTER — Ambulatory Visit: Payer: Medicare Other | Admitting: Orthopedic Surgery

## 2023-08-29 DIAGNOSIS — I87332 Chronic venous hypertension (idiopathic) with ulcer and inflammation of left lower extremity: Secondary | ICD-10-CM | POA: Diagnosis not present

## 2023-08-29 DIAGNOSIS — I890 Lymphedema, not elsewhere classified: Secondary | ICD-10-CM | POA: Diagnosis not present

## 2023-08-29 DIAGNOSIS — L97912 Non-pressure chronic ulcer of unspecified part of right lower leg with fat layer exposed: Secondary | ICD-10-CM | POA: Diagnosis not present

## 2023-08-29 NOTE — Progress Notes (Signed)
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  Patient has been using compression wraps and Silvadene.  Patient states she recently had an ulcer on the right heel was treated with cefadroxil but still has sensitivity to touch.  Patient has an appointment with vascular surgery on October 11.  Assessment & Plan: Visit Diagnoses:  1. Lymphedema   2. 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 Chronic ulcer of right leg, with fat layer exposed (HCC)     Plan: Patient will continue with Dial soap cleansing Silvadene dressing changes and compression to the right leg.  Recommended using the [facility] for the right heel ulcer and patient states she is unable to ambulate in the boot.  She states that it is too heavy.  She will continue with pressure offloading.  Follow-Up Instructions: Return in about 4 weeks (around 09/26/2023).   Ortho Exam  Patient is alert, oriented, no adenopathy, well-dressed, normal affect, normal respiratory effort. Examination of the right foot patient has a palpable dorsalis pedis pulse.  The chronic wound has superficial epithelialization and flat granulation tissue.  It currently measures 5 x 10 cm.  She has a small ulcer on the right heel that is 3 mm in diameter 1 mm deep it is tender to palpation but there is no cellulitis odor or drainage there is no tunneling.  Patient is also completed a prednisone Dosepak for her neck.  Imaging: No results found. No  images are attached to the encounter.  Labs: Lab Results  Component Value Date   ESRSEDRATE 40 (H) 02/07/2022   CRP 20.5 (H) 02/07/2022   REPTSTATUS 02/16/2022 FINAL 02/10/2022   GRAMSTAIN  02/10/2022    RARE WBC PRESENT, PREDOMINANTLY PMN NO ORGANISMS SEEN    CULT  02/10/2022    RARE ENTEROCOCCUS FAECALIS RARE BACTEROIDES SPECIES NOT FRAGILIS BETA LACTAMASE POSITIVE Performed at [facility], [address] [place], [place] [address]    [facility] ENTEROCOCCUS FAECALIS 02/10/2022     Lab Results  Component Value Date   ALBUMIN 1.5 (L) 02/09/2022   ALBUMIN 2.1 (L) 02/06/2022   ALBUMIN 3.7 06/25/2017    Lab Results  Component Value Date   MG 2.0 02/12/2022   MG 1.8 02/07/2022   Lab Results  Component Value Date   VD25OH 69.88 02/07/2022    No results found for: "PREALBUMIN"    Latest Ref Rng & Units 03/16/2022   12:00 AM 03/04/2022   12:00 AM 02/25/2022    1:16 AM  CBC EXTENDED  WBC  6.9     5.7     10.0   RBC 3.87 - 5.11 2.96     3.43     2.98   Hemoglobin 12.0 - 16.0 8.6     9.8     8.2   HCT  36 - 46 26     29     26.7   Platelets 150 - 400 K/uL 272     420     375   NEUT#   3.70     6.3   Lymph# 0.7 - 4.0 K/uL   2.1      This result is from an external source.     There is no height or weight on file to calculate BMI.  Orders:  No orders of the defined types were placed in this encounter.  No orders of the defined types were placed in this encounter.    Procedures: No procedures performed  Clinical Data: No additional findings.  ROS:  All other systems negative, except as noted in the HPI. Review of Systems  Objective: Vital Signs: There were no vitals taken for this visit.  Specialty Comments:  No specialty comments available.  PMFS History: Patient Active Problem List   Diagnosis Date Noted   Abscess of right lower leg    Severe protein-calorie malnutrition (HCC)    PVD (peripheral vascular disease) (HCC)    Fall    Atrial  fibrillation (HCC)    Goals of care, counseling/discussion    Anemia    Fluid collection (edema) in the arms, legs, hands and feet    Cellulitis of right lower extremity 02/06/2022   Chest pain, rule out acute myocardial infarction 03/16/2021   HTN (hypertension) 03/16/2021   Hypothyroidism 03/16/2021   HLD (hyperlipidemia) 03/16/2021   Aortic valve stenosis 03/16/2021   Chronic venous insufficiency of lower extremity 03/16/2021   Short-term memory loss 12/13/2019   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   Primary osteoarthritis of right knee 07/16/2016   Eosinophilia 06/16/2015   Chronic pain disorder    Osteoarthritis    Incisional umbilical hernia, without obstruction or gangrene    Iron deficiency anemia 09/19/2012   Past Medical History:  Diagnosis Date   Anemia    iron deficiency hx.has had iron infusions before    Chronic low back pain    Chronic pain disorder    Complication of anesthesia    severe claustrophobia   Constipation    r/t use of pain meds.Takes OTC meds or eats prunes   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HCC)    Unsteady gait    occasionally   Urinary urgency     Family History  Problem Relation Age of Onset   Stroke  Father     Past Surgical History:  Procedure Laterality Date  ABDOMINAL AORTOGRAM W/LOWER EXTREMITY N/A 02/15/2022   Procedure: ABDOMINAL AORTOGRAM W/LOWER EXTREMITY;  Surgeon: Maeola Harman, MD;  Location: [facility] INVASIVE CV LAB;  Service: Cardiovascular;  Laterality: N/A;   ABDOMINAL HYSTERECTOMY     ANTERIOR FUSION CERVICAL SPINE     x2 -C4-7   APPLICATION OF WOUND VAC Right 03/12/2022   Procedure: APPLICATION OF WOUND VAC;  Surgeon: Nadara Mustard, MD;  Location: [facility] OR;  Service: Orthopedics;  Laterality: Right;   BUNIONECTOMY Bilateral    COLONOSCOPY     CYSTOSCOPY W/ URETEROSCOPY  2012   EYE SURGERY Bilateral    cataract /lens implant   HOLMIUM LASER APPLICATION Left 02/08/2013   Procedure: HOLMIUM LASER APPLICATION;  Surgeon: Anner Crete, MD;  Location: [facility];  Service: Urology;  Laterality: Left;   I & D EXTREMITY Right 02/10/2022   Procedure: DEBRIDEMENT RIGHT LEG ABSCESS;  Surgeon: Nadara Mustard, MD;  Location: [facility] OR;  Service: Orthopedics;  Laterality: Right;   I & D EXTREMITY Right 03/12/2022   Procedure: RIGHT LEG IRRIGATION AND DEBRIDEMENT EXTREMITY;  Surgeon: Nadara Mustard, MD;  Location: [facility] OR;  Service: Orthopedics;  Laterality: Right;   INSERTION OF MESH N/A 07/15/2014   Procedure: INSERTION OF MESH;  Surgeon: Ardeth Sportsman, MD;  Location: [facility] OR;  Service: General;  Laterality: N/A;   JOINT REPLACEMENT Right 2012   shoulder   LAPAROSCOPIC CHOLECYSTECTOMY W/ CHOLANGIOGRAPHY  2012   Dr Magnus Ivan   NASAL SINUS SURGERY     OPEN REDUCTION INTERNAL FIXATION (ORIF) DISTAL RADIAL FRACTURE Left 03/20/2021   Procedure: OPEN REDUCTION INTERNAL FIXATION (ORIF) DISTAL RADIAL FRACTURE;  Surgeon: Teryl Lucy, MD;  Location: [facility] OR;  Service: Orthopedics;  Laterality: Left;   RADIOLOGY WITH ANESTHESIA N/A 05/09/2014   Procedure: ADULT SEDATION WITH ANESTHESIA/MRI CERVICAL SPINE WITHOUT CONTRAST;  Surgeon: Medication Radiologist, MD;  Location: [facility] OR;   Service: Radiology;  Laterality: N/A;  DR. HAWKS/MRI   right knee arthroscopy     d/t meniscal tear   SHOULDER ARTHROSCOPY W/ ROTATOR CUFF REPAIR Bilateral three times each over several yrs   SKIN FULL THICKNESS GRAFT Right 03/12/2022   Procedure: SKIN GRAFT FULL THICKNESS;  Surgeon: Nadara Mustard, MD;  Location: [facility] OR;  Service: Orthopedics;  Laterality: Right;   SKIN SPLIT GRAFT Right 02/17/2022   Procedure: RIGHT LEG SKIN GRAFT;  Surgeon: Nadara Mustard, MD;  Location: [facility] OR;  Service: Orthopedics;  Laterality: Right;   THUMB ARTHROSCOPY Left    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   Procedure: RIGHT TOTAL KNEE ARTHROPLASTY;  Surgeon: Jodi Geralds, MD;  Location: [facility] OR;  Service: Orthopedics;  Laterality: Right;   UMBILICAL HERNIA REPAIR N/A 07/15/2014   Procedure: LAPAROSCOPIC UMBILICAL AND INFRAUMBILICAL HERNIA;  Surgeon: Ardeth Sportsman, MD;  Location: [facility] OR;  Service: General;  Laterality: N/A;   Social History   Occupational History   Not on file  Tobacco Use   Smoking status: Former    Current packs/day: 0.00    Average packs/day: 0.5 packs/day for 2.0 years (1.0 ttl pk-yrs)    Types: Cigarettes    Start date: 02/06/1990    Quit date: 02/07/1992    Years since quitting: 31.5   Smokeless tobacco: Never  Vaping Use   Vaping status: Never Used  Substance and Sexual Activity   Alcohol use: No   Drug use: No   Sexual activity: Not on file

## 2023-08-31 ENCOUNTER — Other Ambulatory Visit: Payer: Self-pay | Admitting: *Deleted

## 2023-08-31 DIAGNOSIS — I872 Venous insufficiency (chronic) (peripheral): Secondary | ICD-10-CM

## 2023-09-01 ENCOUNTER — Telehealth: Payer: Self-pay | Admitting: Orthopedic Surgery

## 2023-09-01 NOTE — Telephone Encounter (Signed)
I have submitted a referral/request to order wound care supplies for pt. This process is pending. Will keep open with updates from [facility].

## 2023-09-01 NOTE — Telephone Encounter (Signed)
Pt's daughter Jacki Cones called asking for wound care supplies be sent to different vendor due to pt insurance.New vendor is [employer] fax number [phone]. New company is asking for Right leg and right foot supplies order including width, depth, and length of wounds. She states same supplies that was being sent to other vendor will be the same for this vendor. Pt's daughter states she lives in [place] with 6 hr difference in time. Jacki Cones phone number is [phone].

## 2023-09-06 NOTE — Telephone Encounter (Signed)
I have faxed an order to fax number below as well, as I have not heard anything from me submitting an online order.

## 2023-09-07 ENCOUNTER — Other Ambulatory Visit: Payer: Self-pay

## 2023-09-07 ENCOUNTER — Inpatient Hospital Stay (HOSPITAL_COMMUNITY)
Admission: EM | Admit: 2023-09-07 | Discharge: 2023-09-16 | DRG: 603 | Disposition: A | Discharge status: Home Health | Payer: Medicare Other | Attending: Family Medicine | Admitting: Family Medicine

## 2023-09-07 ENCOUNTER — Emergency Department (HOSPITAL_COMMUNITY): Payer: Medicare Other

## 2023-09-07 DIAGNOSIS — Z91041 Radiographic dye allergy status: Secondary | ICD-10-CM

## 2023-09-07 DIAGNOSIS — I890 Lymphedema, not elsewhere classified: Secondary | ICD-10-CM | POA: Diagnosis present

## 2023-09-07 DIAGNOSIS — L97819 Non-pressure chronic ulcer of other part of right lower leg with unspecified severity: Secondary | ICD-10-CM | POA: Diagnosis present

## 2023-09-07 DIAGNOSIS — L97519 Non-pressure chronic ulcer of other part of right foot with unspecified severity: Secondary | ICD-10-CM | POA: Diagnosis not present

## 2023-09-07 DIAGNOSIS — Z66 Do not resuscitate: Secondary | ICD-10-CM | POA: Diagnosis present

## 2023-09-07 DIAGNOSIS — I872 Venous insufficiency (chronic) (peripheral): Secondary | ICD-10-CM | POA: Diagnosis present

## 2023-09-07 DIAGNOSIS — G4700 Insomnia, unspecified: Secondary | ICD-10-CM | POA: Diagnosis present

## 2023-09-07 DIAGNOSIS — E785 Hyperlipidemia, unspecified: Secondary | ICD-10-CM | POA: Diagnosis present

## 2023-09-07 DIAGNOSIS — F40240 Claustrophobia: Secondary | ICD-10-CM | POA: Diagnosis present

## 2023-09-07 DIAGNOSIS — Z79890 Hormone replacement therapy (postmenopausal): Secondary | ICD-10-CM | POA: Diagnosis not present

## 2023-09-07 DIAGNOSIS — I350 Nonrheumatic aortic (valve) stenosis: Secondary | ICD-10-CM | POA: Diagnosis present

## 2023-09-07 DIAGNOSIS — I878 Other specified disorders of veins: Secondary | ICD-10-CM | POA: Diagnosis present

## 2023-09-07 DIAGNOSIS — Z981 Arthrodesis status: Secondary | ICD-10-CM

## 2023-09-07 DIAGNOSIS — I4891 Unspecified atrial fibrillation: Secondary | ICD-10-CM | POA: Diagnosis present

## 2023-09-07 DIAGNOSIS — Z880 Allergy status to penicillin: Secondary | ICD-10-CM | POA: Diagnosis not present

## 2023-09-07 DIAGNOSIS — Z882 Allergy status to sulfonamides status: Secondary | ICD-10-CM

## 2023-09-07 DIAGNOSIS — E039 Hypothyroidism, unspecified: Secondary | ICD-10-CM | POA: Diagnosis present

## 2023-09-07 DIAGNOSIS — L03119 Cellulitis of unspecified part of limb: Secondary | ICD-10-CM | POA: Diagnosis not present

## 2023-09-07 DIAGNOSIS — Z7982 Long term (current) use of aspirin: Secondary | ICD-10-CM

## 2023-09-07 DIAGNOSIS — Z79899 Other long term (current) drug therapy: Secondary | ICD-10-CM

## 2023-09-07 DIAGNOSIS — G8929 Other chronic pain: Secondary | ICD-10-CM | POA: Diagnosis present

## 2023-09-07 DIAGNOSIS — Z823 Family history of stroke: Secondary | ICD-10-CM

## 2023-09-07 DIAGNOSIS — F32A Depression, unspecified: Secondary | ICD-10-CM | POA: Diagnosis present

## 2023-09-07 DIAGNOSIS — M86671 Other chronic osteomyelitis, right ankle and foot: Secondary | ICD-10-CM | POA: Diagnosis present

## 2023-09-07 DIAGNOSIS — L97419 Non-pressure chronic ulcer of right heel and midfoot with unspecified severity: Secondary | ICD-10-CM | POA: Diagnosis present

## 2023-09-07 DIAGNOSIS — Z888 Allergy status to other drugs, medicaments and biological substances status: Secondary | ICD-10-CM

## 2023-09-07 DIAGNOSIS — D649 Anemia, unspecified: Secondary | ICD-10-CM | POA: Diagnosis present

## 2023-09-07 DIAGNOSIS — Z87891 Personal history of nicotine dependence: Secondary | ICD-10-CM

## 2023-09-07 DIAGNOSIS — D509 Iron deficiency anemia, unspecified: Secondary | ICD-10-CM | POA: Diagnosis present

## 2023-09-07 DIAGNOSIS — Z96651 Presence of right artificial knee joint: Secondary | ICD-10-CM | POA: Diagnosis present

## 2023-09-07 DIAGNOSIS — N179 Acute kidney failure, unspecified: Secondary | ICD-10-CM | POA: Diagnosis present

## 2023-09-07 DIAGNOSIS — L0390 Cellulitis, unspecified: Secondary | ICD-10-CM | POA: Diagnosis present

## 2023-09-07 DIAGNOSIS — L03116 Cellulitis of left lower limb: Secondary | ICD-10-CM | POA: Diagnosis present

## 2023-09-07 DIAGNOSIS — Z96611 Presence of right artificial shoulder joint: Secondary | ICD-10-CM | POA: Diagnosis present

## 2023-09-07 DIAGNOSIS — I10 Essential (primary) hypertension: Secondary | ICD-10-CM | POA: Diagnosis present

## 2023-09-07 DIAGNOSIS — I358 Other nonrheumatic aortic valve disorders: Secondary | ICD-10-CM | POA: Diagnosis present

## 2023-09-07 DIAGNOSIS — M86261 Subacute osteomyelitis, right tibia and fibula: Secondary | ICD-10-CM | POA: Diagnosis not present

## 2023-09-07 DIAGNOSIS — Z91048 Other nonmedicinal substance allergy status: Secondary | ICD-10-CM

## 2023-09-07 DIAGNOSIS — K219 Gastro-esophageal reflux disease without esophagitis: Secondary | ICD-10-CM | POA: Diagnosis present

## 2023-09-07 DIAGNOSIS — M869 Osteomyelitis, unspecified: Secondary | ICD-10-CM | POA: Diagnosis present

## 2023-09-07 DIAGNOSIS — I739 Peripheral vascular disease, unspecified: Secondary | ICD-10-CM | POA: Diagnosis present

## 2023-09-07 DIAGNOSIS — Z91040 Latex allergy status: Secondary | ICD-10-CM

## 2023-09-07 DIAGNOSIS — I480 Paroxysmal atrial fibrillation: Secondary | ICD-10-CM | POA: Diagnosis present

## 2023-09-07 DIAGNOSIS — M86172 Other acute osteomyelitis, left ankle and foot: Secondary | ICD-10-CM | POA: Diagnosis not present

## 2023-09-07 DIAGNOSIS — M545 Low back pain, unspecified: Secondary | ICD-10-CM | POA: Diagnosis present

## 2023-09-07 DIAGNOSIS — Z8673 Personal history of transient ischemic attack (TIA), and cerebral infarction without residual deficits: Secondary | ICD-10-CM

## 2023-09-07 DIAGNOSIS — Z91013 Allergy to seafood: Secondary | ICD-10-CM

## 2023-09-07 DIAGNOSIS — R262 Difficulty in walking, not elsewhere classified: Secondary | ICD-10-CM | POA: Diagnosis present

## 2023-09-07 DIAGNOSIS — I70232 Atherosclerosis of native arteries of right leg with ulceration of calf: Secondary | ICD-10-CM | POA: Diagnosis not present

## 2023-09-07 DIAGNOSIS — L97514 Non-pressure chronic ulcer of other part of right foot with necrosis of bone: Secondary | ICD-10-CM | POA: Diagnosis not present

## 2023-09-07 DIAGNOSIS — G894 Chronic pain syndrome: Secondary | ICD-10-CM | POA: Diagnosis present

## 2023-09-07 LAB — CBC WITH DIFFERENTIAL/PLATELET
Abs Immature Granulocytes: 0.02 10*3/uL (ref 0.00–0.07)
Basophils Absolute: 0.1 10*3/uL (ref 0.0–0.1)
Basophils Relative: 1 %
Eosinophils Absolute: 0.6 10*3/uL — ABNORMAL HIGH (ref 0.0–0.5)
Eosinophils Relative: 10 %
HCT: 31.5 % — ABNORMAL LOW (ref 36.0–46.0)
Hemoglobin: 10.2 g/dL — ABNORMAL LOW (ref 12.0–15.0)
Immature Granulocytes: 0 %
Lymphocytes Relative: 15 %
Lymphs Abs: 0.8 10*3/uL (ref 0.7–4.0)
MCH: 29 pg (ref 26.0–34.0)
MCHC: 32.4 g/dL (ref 30.0–36.0)
MCV: 89.5 fL (ref 80.0–100.0)
Monocytes Absolute: 0.5 10*3/uL (ref 0.1–1.0)
Monocytes Relative: 9 %
Neutro Abs: 3.6 10*3/uL (ref 1.7–7.7)
Neutrophils Relative %: 65 %
Platelets: 301 10*3/uL (ref 150–400)
RBC: 3.52 MIL/uL — ABNORMAL LOW (ref 3.87–5.11)
RDW: 14.5 % (ref 11.5–15.5)
WBC: 5.5 10*3/uL (ref 4.0–10.5)
nRBC: 0 % (ref 0.0–0.2)

## 2023-09-07 LAB — COMPREHENSIVE METABOLIC PANEL
ALT: 29 U/L (ref 0–44)
AST: 30 U/L (ref 15–41)
Albumin: 3.5 g/dL (ref 3.5–5.0)
Alkaline Phosphatase: 103 U/L (ref 38–126)
Anion gap: 10 (ref 5–15)
BUN: 21 mg/dL (ref 8–23)
CO2: 26 mmol/L (ref 22–32)
Calcium: 9.1 mg/dL (ref 8.9–10.3)
Chloride: 106 mmol/L (ref 98–111)
Creatinine, Ser: 1.16 mg/dL — ABNORMAL HIGH (ref 0.44–1.00)
GFR, Estimated: 46 mL/min — ABNORMAL LOW (ref >60)
Glucose, Bld: 137 mg/dL — ABNORMAL HIGH (ref 70–99)
Potassium: 3.9 mmol/L (ref 3.5–5.1)
Sodium: 142 mmol/L (ref 135–145)
Total Bilirubin: 0.6 mg/dL (ref 0.3–1.2)
Total Protein: 6.3 g/dL — ABNORMAL LOW (ref 6.5–8.1)

## 2023-09-07 LAB — I-STAT CG4 LACTIC ACID, ED
Lactic Acid, Venous: 1.6 mmol/L (ref 0.5–1.9)
Lactic Acid, Venous: 1 mmol/L (ref 0.5–1.9)

## 2023-09-07 LAB — PROTIME-INR
INR: 1 (ref 0.8–1.2)
Prothrombin Time: 13.2 s (ref 11.4–15.2)

## 2023-09-07 MED ORDER — PANTOPRAZOLE SODIUM 40 MG PO TBEC
40.0000 mg | DELAYED_RELEASE_TABLET | Freq: Two times a day (BID) | ORAL | Status: DC
  Administered 2023-09-07 – 2023-09-16 (×17): 40 mg via ORAL
  Filled 2023-09-07 (×18): qty 1

## 2023-09-07 MED ORDER — SERTRALINE HCL 50 MG PO TABS: 50.0000 mg | ORAL_TABLET | Freq: Every day | ORAL | Status: DC

## 2023-09-07 MED ORDER — HYDROCODONE-ACETAMINOPHEN 10-325 MG PO TABS
1.0000 | ORAL_TABLET | Freq: Four times a day (QID) | ORAL | Status: DC | PRN
  Administered 2023-09-07 – 2023-09-09 (×5): 1 via ORAL
  Filled 2023-09-07 (×5): qty 1

## 2023-09-07 MED ORDER — VANCOMYCIN HCL 1250 MG/250ML IV SOLN
1250.0000 mg | Freq: Once | INTRAVENOUS | Status: AC
  Administered 2023-09-08: 1250 mg via INTRAVENOUS
  Filled 2023-09-07 (×2): qty 250

## 2023-09-07 MED ORDER — POLYETHYLENE GLYCOL 3350 17 G PO PACK
17.0000 g | PACK | Freq: Every day | ORAL | Status: DC
  Administered 2023-09-08 – 2023-09-14 (×4): 17 g via ORAL
  Filled 2023-09-07 (×6): qty 1

## 2023-09-07 MED ORDER — CYCLOSPORINE 0.05 % OP EMUL
1.0000 [drp] | Freq: Every day | OPHTHALMIC | Status: DC
  Administered 2023-09-08 – 2023-09-16 (×8): 1 [drp] via OPHTHALMIC
  Filled 2023-09-07 (×9): qty 30

## 2023-09-07 MED ORDER — FLUTICASONE PROPIONATE 50 MCG/ACT NA SUSP
1.0000 | Freq: Two times a day (BID) | NASAL | Status: DC | PRN
  Administered 2023-09-14: 2 via NASAL
  Filled 2023-09-07: qty 16

## 2023-09-07 MED ORDER — ACETAMINOPHEN 500 MG PO TABS
1000.0000 mg | ORAL_TABLET | Freq: Three times a day (TID) | ORAL | Status: DC
  Administered 2023-09-07 – 2023-09-09 (×6): 1000 mg via ORAL
  Filled 2023-09-07 (×8): qty 2

## 2023-09-07 MED ORDER — ASPIRIN 81 MG PO TBEC
81.0000 mg | DELAYED_RELEASE_TABLET | Freq: Every day | ORAL | Status: DC
  Administered 2023-09-08 – 2023-09-16 (×9): 81 mg via ORAL
  Filled 2023-09-07 (×9): qty 1

## 2023-09-07 MED ORDER — ENOXAPARIN SODIUM 30 MG/0.3ML IJ SOSY
30.0000 mg | PREFILLED_SYRINGE | INTRAMUSCULAR | Status: DC
  Administered 2023-09-08: 30 mg via SUBCUTANEOUS
  Filled 2023-09-07: qty 0.3

## 2023-09-07 MED ORDER — ADULT MULTIVITAMIN W/MINERALS CH
ORAL_TABLET | Freq: Every day | ORAL | Status: DC
  Administered 2023-09-08 – 2023-09-16 (×8): 1 via ORAL
  Filled 2023-09-07 (×9): qty 1

## 2023-09-07 MED ORDER — LEVOTHYROXINE SODIUM 50 MCG PO TABS
50.0000 ug | ORAL_TABLET | Freq: Every morning | ORAL | Status: DC
  Administered 2023-09-08 – 2023-09-09 (×2): 50 ug via ORAL
  Filled 2023-09-07: qty 2
  Filled 2023-09-07: qty 1

## 2023-09-07 MED ORDER — LORATADINE 10 MG PO TABS
10.0000 mg | ORAL_TABLET | Freq: Every day | ORAL | Status: DC
  Administered 2023-09-08 – 2023-09-16 (×9): 10 mg via ORAL
  Filled 2023-09-07 (×9): qty 1

## 2023-09-07 MED ORDER — SODIUM CHLORIDE 0.9 % IV SOLN
2.0000 g | Freq: Once | INTRAVENOUS | Status: AC
  Administered 2023-09-07: 2 g via INTRAVENOUS
  Filled 2023-09-07: qty 12.5

## 2023-09-07 MED ORDER — MELATONIN 3 MG PO TABS
3.0000 mg | ORAL_TABLET | Freq: Every evening | ORAL | Status: DC
  Administered 2023-09-08 – 2023-09-15 (×9): 3 mg via ORAL
  Filled 2023-09-07 (×9): qty 1

## 2023-09-07 MED ORDER — CARBOXYMETHYLCELLULOSE SODIUM 0.5 % OP SOLN: 1.0000 [drp] | Freq: Three times a day (TID) | OPHTHALMIC | Status: DC | PRN

## 2023-09-07 NOTE — H&P (Cosign Needed)
Hospital Admission History and Physical Service Pager: [phone]  Patient name: Isabel Barnes Medical record number: [id number] Date of Birth: Mar 28, 1941 Age: 82 y.o. Gender: female  Primary Care Provider: Jamal Collin, PA-C Consultants: Orthopedic surgery Code Status: DNR which was confirmed with patient Preferred Emergency Contact: Mirna Mires (daughter) [phone] Contact Information     Name Relation Home Work Mobile   everette,kelly Legal Guardian   [phone]      Other Contacts     Name Relation Home Work Mobile   Tolna Son  [phone]   [facility] Daughter [phone]  [phone]   Lee,Deanna Neighbor   [phone]   Cecille Po   [phone]       Chief Complaint: wound infection, sent by orthopedic surgeon  Assessment and Plan: Isabel Barnes is a 82 y.o. female presenting with R heel wound/ulcer ongoing for 2 months, being treated with PO antibiotics outpatient (pt does not know what Abx). Her wound has been worsening and she was directed by orthopedist to come to the hospital for IV antibiotics.   Differential for this patient's presentation of this includes ulcer, osteomyelitis, cellulitis.   Strong suspicion for ulcer with possible overlying cellulitis.  Patient's legs do appear consistent with stasis dermatitis secondary to her known venous insufficiency; lower suspicion for cellulitis without significant warmth, tenderness, erythema to these areas. Very low suspicion for systemic infection given patient is afebrile, well-appearing, without leukocytosis on labs.  Low suspicion for osteomyelitis given x-ray imaging obtained in ED, however will consider MRI for further evaluation of possible infection. Assessment & Plan Ulcer of right foot, unspecified ulcer stage (HCC) Admitted for IV antibiotics at recommendation of orthopedist after no improvement with 10-day course of oral antibiotics.  [facility]  has agreed to see the patient tomorrow. - Admit to FMTS, MedSurg, attending Dr. Lum Babe -Orthopedic surgery following, Dr. Lajoyce Corners to see on 10/10 - Vital signs per floor - PT/OT to treat - f/u blood cultures - Continue antibiotics: Vancomycin and cefepime - AM CBC -Continue home Tylenol and Norco for pain -MiraLAX ordered for chronic narcotic use -Consult to wound care Cellulitis of lower extremity, unspecified laterality Patient with history of venous insufficiency and significant stasis dermatitis; she has had cellulitis in the past.  -Antibiotic treatment as above AKI (acute kidney injury) (HCC) Creatinine 1.16 on admission; baseline appears to be around 0.7. - Encourage fluids -- AM BMP - Will hold off on IV fluids at this time due to patient with good p.o., however low threshold to begin fluids if no improvement  Chronic and Stable Conditions: Hypertension-holding home losartan due to AKI Hypothyroidism- home Synthroid 50 mcg GERD-pantoprazole 40 mg twice daily CVA hx? Question ASA 81mg  indication as this is strictly incidental finding on head CT  FEN/GI: Regular diet VTE Prophylaxis: Lovenox  Disposition: MedSurg  History of Present Illness:  Isabel Barnes is a 82 y.o. female presenting with right heel wound, sent by orthopedic surgeon for IV antibiotics.  She reports right heel wound has been present for a couple of months and she has been taking antibiotics for the past 10 days (likely cefdinir per home med list) though she does not know exactly what antibiotic this is.  She has had difficulty walking lately because of increased pain to the right heel, and because she states she does not have padding in the bottom of her feet.  Uses a walker at home, never walks barefoot.  She also reports left heel pain that is  new.  Additionally she has a left leg wound that has struggled to heal.  She has been applying Silvadene to her wounds.  She denies any fevers, or other sick  symptoms.  In the ED, patient had x-ray of right tibia/fibula and ankle without overt evidence of osteomyelitis, but visible ulceration of the soft tissue.  Blood cultures were collected.  CMP with elevated creatinine, electrolytes otherwise WNL.  CBC without leukocytosis, lactic acid 1.0.  Patient was started on IV vancomycin and cefepime.  ED consulted [facility] who agrees to see the patient tomorrow.  Review Of Systems: Per HPI.  Pertinent Past Medical History: Ankylosing spondilitis  HTN HLD (does not take crestor) Hypothyroidism AV Stenosis Afib GERD Peripheral vascular disease Iron deficiency anemia (does not take iron because it makes her sick but she does get iron infusions) Remainder reviewed in history tab.   Pertinent Past Surgical History: Debridement R leg abscess Full thickness skin graft to R leg Abdominal hysterectomy C-spine fusion Bilateral cataracts Laparascopic cholecystectomy Right shoulder replacement Knee replacement Nasal sinus surgery Umbilical hernia repair Remainder reviewed in history tab.   Pertinent Social History: Tobacco use: Former (smoked for 2 years between 1991-1993) Alcohol use: None Other Substance use: None Lives alone, has helper who comes and visits her house 3 days a week (not a medically-covered individual)  Pertinent Family History: Father - stroke Remainder reviewed in history tab.   Important Outpatient Medications: Tylenol 1000 mg 3 times daily Norco 10-325 mg 3 times daily Synthroid 50 mcg daily Loperamide 2 mg 3 times daily as needed Losartan 25 mg daily Omeprazole 20 mg twice daily Zofran 8 mg every 8 hours as needed Compazine 5 mg daily Zoloft 50 mg daily Hydroxyzine 25 mg every 8 hours as needed Silvadene 1% cream twice daily Nasacort twice daily as needed Remainder reviewed in medication history.   Objective: BP (!) 140/61   Pulse 77   Temp 98.8 F (37.1 C) (Oral)   Resp 16   Ht 5' (1.524 m)   Wt  55.3 kg   SpO2 100%   BMI 23.83 kg/m  General: Well-appearing, lying in bed, talkative and pleasant.  No acute distress. HEENT: normocephalic, PERRLA, MMM.  Nares patent, no rhinorrhea. Cardio: Regular rate, regular rhythm. 4/6 holosystolic murmur noted. Pulm: Clear, no wheezing, no crackles. No increased work of breathing.  On room air. Abdominal: bowel sounds present, soft, non-tender, non-distended.  No masses. Extremities: Lymphedema in bilateral lower extremities.  Right lower leg with large wound on the medial aspect, no weeping or drainage.  Right heel with small ulcer, no drainage. 2+ pedal pulse bilaterally (see photos below) Neuro: Alert and oriented, speech normal in content. Psych:  Cognition and judgment appear intact. Alert, communicative, and cooperative.         Labs:  CBC BMET  Recent Labs  Lab 09/07/23 1404  WBC 5.5  HGB 10.2*  HCT 31.5*  PLT 301   Recent Labs  Lab 09/07/23 1404  NA 142  K 3.9  CL 106  CO2 26  BUN 21  CREATININE 1.16*  GLUCOSE 137*  CALCIUM 9.1    Pertinent additional labs: - Blood cultures collected. - Protime-INR WNL - LA and repeat LA both WNL  Imaging Studies Performed: R Ankle, Tib/Fib xray:  1. Wound about the medial aspect of the lower leg. Cortical thickening in the mid aspect of the tibia at the level of wound may be sequela of prior infection. Consider MRI for further assessment of  acute infection. 2. Plantar soft tissue defect typical of ulcer. Generalized soft tissue edema about the ankle and lower leg.   Cyndia Skeeters, DO 09/08/2023, 12:54 AM PGY-1, [facility]  FPTS Intern pager: [phone], text pages welcome Secure chat group [facility] Teaching Service   I was personally present and performed or re-performed the history, physical exam and medical decision making activities of this service and have verified that the service and findings are accurately documented in the  resident's note.  Shelby Mattocks, DO                  09/08/2023, 1:23 AM

## 2023-09-07 NOTE — Assessment & Plan Note (Signed)
-   Admit to FMTS, attending Dr. Lum Babe -MedSurg, Vital signs per floor -Regular diet  - PT/OT to treat - VTE prophylaxis: Lovenox -Blood cultures x 2 - Continue antibiotics: Vancomycin and cefepime - AM CBC, BMP  -Continue home Tylenol and Norco for pain -MiraLAX ordered for chronic narcotic use

## 2023-09-07 NOTE — Hospital Course (Addendum)
Isabel Barnes is an 82 yo female with past medical history of peripheral arterial disease, chronic venous insufficiency, CVA (remote R cerebellar infarct incidentally noted in 01/2022), HTN, hypothyroidism, and depression who presents here with chronic R heel ulcer, R leg wound, and LLE cellulitis. She was admitted to [facility] to the [facility] Teaching Service on 10/09. Her hospital course is as detailed below:   RLE Wound  R Heel Ulcer Patient with R shin wound, has been followed by Orthopedics and Wound Care for this for several months, and R heel ulcer which is ongoing x 2 months. Afebrile with no systemic symptoms, worsening pain of RLE. Failed outpatient course of Abx (10 days of Cefadroxil), here for IV Abx at recommendation of Wound Care. History of extensive venous insufficiency and PAD with chronic RLE wound, prior cultures during hospitalization for similar in 2023 with E. Faecalis and Bacteroides. Patient afebrile with no leukocytosis or systemic symptoms on admission. Continued on Vanc, Cefepime, and Flagyl given prior wound cultures. Blood cultures remained negative. MRI of RLE with evidence of chronic tibial cortical bone infection, no acute osteomyelitis. Ortho recommended continued follow-up with [facility]VS and did not recommend surgical intervention at that time. Vascular surgery completed arteriogram 10/14 after abnormal ABIs; this revealed grossly patent vessels, and no intervention was undertaken.  At this time patient was offered the option of BKA for definitive treatment versus prolonged course of outpatient IV antibiotics.  Patient elected for IV antibiotic treatment.  Infectious Disease planned for the following antibiotic regimen: - Daptomycin 500 mg IV daily - Zosyn 13.5 mg IV continuous infusion daily She had a PICC line placed; patient and caregiver received education on PICC line maintenance and IV medication administration prior to discharge.  Antibiotic  regimen set to continue until patient follows up with Dr. Daiva Eves 11/25.  LLE Cellulitis LLE cellulitis of the L ankle and shin. Hx of venous insufficiency and significant stasis dermatitis. Afebrile during admission with no leukocytosis or sick symptoms. Antibiotic regimen as above. Blood cultures remained negative.   Peripheral arterial disease (HCC) Hx of extensive PAD with chronic claudication symptoms. 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 Patient on ASA and Crestor outpatient, but not taking them. Continued on ASA inpatient. Vascular Surgery consulted. ABI completed 10/11 with Noncompressible LLE arteries, abnormal right TBI though normal left TBI.  Angiogram 10/14 with grossly patent vessels, no intervention undertaken at that time.  Anemia Hgb 10.6 on admission, downtrended to 8.4. Remained stable between 8-10 during remainder of admission. No signs/symptoms consistent with active bleed during admission. History of IDA and prior hospitalization with anemia during which endoscopy was recommended to her, patient declined at that time. Iron studies during stay with evidence of IDA. Patient continued on home oral iron. Recommend further anemia workup outpatient.   Atrial fibrillation (HCC) History of paroxysmal atrial fibrillation, presented in 2023 during hospitalization in Afib with spontaneous conversion. Placed on 200 mg BID in 2023, but per patient, medication discontinued (unsure when or for what reason). Hemodynamically stable during admission with no symptoms. EKG showed NSR.   Aortic valve stenosis Systolic murmur noted on cardiopulmonary exam, patient reports this has been present for years, followed outpatient. Echo in 2023 with normal LVEF, and significant aortic stenosis with aortic valve calcification. Asymptomatic during admission.   Chronic pain disorder History of chronic  arthritic pain, home regimen includes Tylenol 1g TID as  well as Norco 10-325 q6h, patient taking q4h at home.  Attempted to switch management to Oxycodone, Tylenol, Ibuprofen, and lidocaine patches inpatient.  However, ultimately resumed home regimen along with Tylenol, ibuprofen, and lidocaine patches due to uncontrolled pain.  Recommend outpatient follow up for further management of pain.   AKI (acute kidney injury) (HCC) Cr of 1.16 on admission with baseline around 0.7. Improved to 0.74. Patient asymptomatic during admission. Did not require IV fluids.  Other chronic conditions were medically managed with home medications and formulary alternatives as necessary: (Hypertension (home Losartan held 2/2 soft BPs), Hypothyroidism, GERD, Insomnia)  PCP Follow Up Recommendations: Recommend outpatient follow up for continued management of atrial fibrillation, peripheral arterial disease Recommend further anemia workup in outpatient setting  Ensure continued wound healing and pain control Recommend outpatient BMP to monitor renal function Will need weekly CK while on daptomycin

## 2023-09-07 NOTE — ED Provider Notes (Signed)
[place] EMERGENCY DEPARTMENT AT [facility] Provider Note   CSN: [id number] Arrival date & time: 09/07/23  1338     History  Chief Complaint  Patient presents with   Wound Infection    Isabel Barnes is a 82 y.o. female with past medical history of hypertension, hyperlipidemia, hypothyroidism, chronic venous insufficiency bilaterally senting to the emergency room with right sided foot wound on heel that has been ongoing and worsening for 2 months.  Patient also has large healing ulcer on right anterior shin.  Patient reports poor healing secondary to venous insufficiency.  Patient has been seeing orthopedic surgeon for these conditions, reports at home wound care and p.o. antibiotics although unsure what antibiotic.  Patient keeps wound clean dry and covered.  Unable to bear weight on the right heel secondary to pain.  Denies any fevers, chills, headaches  HPI     Home Medications Prior to Admission medications   Medication Sig Start Date End Date Taking? Authorizing Provider  acetaminophen (TYLENOL) 500 MG tablet Take 2 tablets (1,000 mg total) by mouth 3 (three) times daily. 02/22/22   Atway, Rayann N, DO  albuterol (VENTOLIN HFA) 108 (90 Base) MCG/ACT inhaler Inhale 2 puffs into the lungs every 4 (four) hours as needed for shortness of breath. 03/23/22   Medina-Vargas, Monina C, NP  amiodarone (PACERONE) 200 MG tablet Take 1 tablet (200 mg total) by mouth 2 (two) times daily. 03/23/22 04/22/22  Medina-Vargas, Monina C, NP  aspirin EC 81 MG tablet Take 1 tablet (81 mg total) by mouth daily. 03/23/22   Medina-Vargas, Monina C, NP  bisacodyl (DULCOLAX) 10 MG suppository Place 10 mg rectally daily as needed (if no relief from milk of mag).    [provider]  carboxymethylcellulose (REFRESH PLUS) 0.5 % SOLN Place 1 drop into both eyes in the morning, at noon, and at bedtime.    [provider]  cefdinir (OMNICEF) 300 MG capsule Take 1 capsule (300 mg total)  by mouth 2 (two) times daily. 03/16/22   Medina-Vargas, Monina C, NP  cholecalciferol (VITAMIN D) 1000 UNITS tablet       diclofenac sodium (VOLTAREN) 1 % GEL Apply 2 g topically 2 (two) times daily as needed (for pain).    [provider]  Digestive Enzymes (ENZYME DIGEST) CAPS Take 1 capsule by mouth daily as needed (For digestive). 03/23/22   Medina-Vargas, Monina C, NP  docusate sodium (COLACE) 100 MG capsule Take 1 capsule (100 mg total) by mouth 2 (two) times daily. 03/23/22   Medina-Vargas, Monina C, NP  doxycycline (VIBRA-TABS) 100 MG tablet Take 1 tablet (100 mg total) by mouth 2 (two) times daily. 02/02/23   Adonis Huguenin, NP  ENSURE PLUS (ENSURE PLUS) LIQD Take 237 mLs by mouth daily as needed (for supplementation).    [provider]  ferrous sulfate 325 (65 FE) MG tablet Take 1 tablet (325 mg total) by mouth daily with breakfast. 03/23/22   Medina-Vargas, Monina C, NP  fish oil-omega-3 fatty acids 1000 MG capsule Take 1,000 mg by mouth 2 (two) times daily.     [provider]  furosemide (LASIX) 20 MG tablet Take 1 tablet (20 mg total) by mouth daily as needed. 03/23/22   Medina-Vargas, Monina C, NP  Lactobacillus (PROBIOTIC ACIDOPHILUS PO) Take 1 capsule by mouth daily.    [provider]  levothyroxine (SYNTHROID) 50 MCG tablet Take 1 tablet (50 mcg total) by mouth every morning. 03/23/22   Medina-Vargas, Monina  C, NP  loperamide (IMODIUM A-D) 2 MG tablet Take 4 mg by mouth 3 (three) times daily as needed for diarrhea or loose stools.    [provider]  loratadine (CLARITIN) 10 MG tablet Take 1 tablet (10 mg total) by mouth daily. 03/23/22   Medina-Vargas, Monina C, NP  losartan (COZAAR) 25 MG tablet Take 1 tablet (25 mg total) by mouth daily. 03/23/22   Medina-Vargas, Monina C, NP  Magnesium Citrate 200 MG TABS Take 100 mg by mouth daily. 03/23/22   Medina-Vargas, Monina C, NP  magnesium hydroxide (MILK OF MAGNESIA) 400 MG/5ML suspension Take 30 mLs  by mouth daily as needed for mild constipation.    [provider]  melatonin 5 MG TABS Take 5 mg by mouth every evening.    [provider]  Multiple Vitamin (DAILY VITE PO) TAKE 1 TABLET BY MOUTH ONCE DAILY FOR SUPPLEMENT    [provider]  Multiple Vitamins-Minerals (MULTIVITAMIN PO) Take 1 tablet by mouth daily.    [provider]  ondansetron (ZOFRAN) 8 MG tablet Take 1 tablet (8 mg total) by mouth every 8 (eight) hours as needed for nausea or vomiting. 03/23/22   Medina-Vargas, Monina C, NP  oxyCODONE (OXY IR/ROXICODONE) 5 MG immediate release tablet Take 1 tablet (5 mg total) by mouth every 6 (six) hours as needed for severe pain. 03/23/22   Medina-Vargas, Monina C, NP  pantoprazole (PROTONIX) 40 MG tablet Take 1 tablet (40 mg total) by mouth daily. 03/23/22 04/22/22  Medina-Vargas, Monina C, NP  polyethylene glycol (MIRALAX / GLYCOLAX) 17 g packet Take 17 g by mouth daily. 03/23/22   Medina-Vargas, Monina C, NP  Potassium Gluconate 595 MG TBCR Take 1 tablet (595 mg total) by mouth daily. 03/23/22   Medina-Vargas, Monina C, NP  predniSONE (DELTASONE) 5 MG tablet Take 1 tablet (5 mg total) by mouth daily with breakfast. 03/23/22   Medina-Vargas, Monina C, NP  RESTASIS 0.05 % ophthalmic emulsion Place 1 drop into both eyes daily. 03/23/22   Medina-Vargas, Monina C, NP  rosuvastatin (CRESTOR) 5 MG tablet Take 1 tablet (5 mg total) by mouth at bedtime. 03/23/22   Medina-Vargas, Monina C, NP  Sodium Phosphates (RA SALINE ENEMA RE) Place 1 enema rectally daily as needed (if no relief  from milk of mag or bisacodyl).    [provider]  triamcinolone (NASACORT) 55 MCG/ACT AERO nasal inhaler Place 1-2 sprays into the nose 2 (two) times daily as needed (for seasonal allergies). 03/23/22   Medina-Vargas, Monina C, NP  Turmeric 500 MG CAPS Take 1 capsule by mouth at bedtime.    [provider]  vitamin B-12 (CYANOCOBALAMIN) 250 MCG tablet Take 250 mcg by mouth  at bedtime.     [provider]      Allergies    Dilaudid [hydromorphone hcl]; Gabapentin; Latex; Lyrica [pregabalin]; Oxycodone; Singulair [montelukast]; Ciprofloxacin hcl; Codeine; Methadone; Metronidazole; Oysters [shellfish allergy]; Clindamycin/lincomycin; Donepezil; Sulfa antibiotics; Tape; Zetia [ezetimibe]; Elemental sulfur; Iodine; Other; Oyster shell; Penicillin g; Penicillins; Povidone iodine; Skintegrity hydrogel [skin protectants, misc.]; and Tapentadol    Review of Systems   Review of Systems  Physical Exam Updated Vital Signs BP (!) 143/63   Pulse 91   Temp 97.7 F (36.5 C)   Resp 15   Ht 5' (1.524 m)   Wt 55.3 kg   SpO2 100%   BMI 23.83 kg/m  Physical Exam Vitals and nursing note reviewed.  Constitutional:      General: Isabel Barnes is not  in acute distress.    Appearance: Isabel Barnes is not toxic-appearing.  HENT:     Head: Normocephalic and atraumatic.  Eyes:     General: No scleral icterus.    Conjunctiva/sclera: Conjunctivae normal.  Cardiovascular:     Rate and Rhythm: Normal rate and regular rhythm.     Pulses: Normal pulses.     Heart sounds: Normal heart sounds.  Pulmonary:     Effort: Pulmonary effort is normal. No respiratory distress.     Breath sounds: Normal breath sounds.  Abdominal:     General: Abdomen is flat. Bowel sounds are normal.     Palpations: Abdomen is soft.     Tenderness: There is no abdominal tenderness.  Skin:    General: Skin is warm and dry.     Capillary Refill: Capillary refill takes less than 2 seconds.     Findings: No lesion.  Neurological:     General: No focal deficit present.     Mental Status: Isabel Barnes is alert and oriented to person, place, and time. Mental status is at baseline.          ED Results / Procedures / Treatments   Labs (all labs ordered are listed, but only abnormal results are displayed) Labs Reviewed  COMPREHENSIVE METABOLIC PANEL - Abnormal; Notable for the following components:      Result  Value   Glucose, Bld 137 (*)    Creatinine, Ser 1.16 (*)    Total Protein 6.3 (*)    GFR, Estimated 46 (*)    All other components within normal limits  CBC WITH DIFFERENTIAL/PLATELET - Abnormal; Notable for the following components:   RBC 3.52 (*)    Hemoglobin 10.2 (*)    HCT 31.5 (*)    Eosinophils Absolute 0.6 (*)    All other components within normal limits  CULTURE, BLOOD (ROUTINE X 2)  CULTURE, BLOOD (ROUTINE X 2)  PROTIME-INR  I-STAT CG4 LACTIC ACID, ED  I-STAT CG4 LACTIC ACID, ED    EKG None  Radiology No results found.  Procedures Procedures    Medications Ordered in ED Medications - No data to display  ED Course/ Medical Decision Making/ A&P Clinical Course as of 09/07/23 1958  Wed Sep 07, 2023  1926 Stable (PA JNB) Heel ulcer. Progressive. Venous stasis disease.  Cellulitis.  [CC]    Clinical Course User Index [CC] Glyn Ade, MD                                 Medical Decision Making Amount and/or Complexity of Data Reviewed Labs: ordered. Radiology: ordered.  Risk Prescription drug management. Decision regarding hospitalization.   This patient presents to the ED for concern of heel wound, this involves an extensive number of treatment options, and is a complaint that carries with it a high risk of complications and morbidity.  The differential diagnosis includes ulcer, cellulitis, osteomyelitis, sepsis   Co morbidities that complicate the patient evaluation  Venous insufficiency, lymphedema   Additional history obtained:  Additional history obtained from 08/29/2023 chronic venous insufficiency   Lab Tests:  I personally interpreted labs.  The pertinent results include:   CBC no increased white blood cell count CMP unremarkable Lactic 1 PT/INR unremarkable Blood cultures pending   Imaging Studies ordered:  I ordered imaging studies including ankle, tib-fib I independently visualized and interpreted imaging which showed  : pending  I agree with the radiologist interpretation  Cardiac Monitoring: / EKG:  The patient was maintained on a cardiac monitor.  Hemodynamically stable, no fever   Consultations Obtained:  I requested consultation with the [facility],  and discussed lab and imaging findings as well as pertinent plan - they recommend: Admit for IV antibiotics, they agreed to see patient tomorrow.  Addressed possible need for vascular consult, spoke with [facility] who said they can address this tomorrow, agree consult tonight does not need to be placed tonight.  Consulted with hospital team, discussed [facility] recommendation, pertinent labs and decided to admit for IV antibiotics for cellulitis and heel wound. They agree to admit and see the patient.    Problem List / ED Course / Critical interventions / Medication management  Reporting with right sided heel wound ulceration, been present for about 2 months.  No fever, hemodynamically stable, patient does not have elevated white blood cell count.  Given that patient's orthopedic provider is recommending IV antibiotics, started IV antibiotics and consulted orthopedics.  They agree patient needs to be admitted.  Blood cultures are pending on this patient.  Does not appear to be septic at this time given reassuring vitals and labs.  I ordered medication including IV vancomycin and cefepime Reevaluation of the patient after these medicines showed that the patient improved I have reviewed the patients home medicines and have made adjustments as needed   Plan  Admission for cellulitis and right heel ulcer, IV antibiotics         Final Clinical Impression(s) / ED Diagnoses Final diagnoses:  None    Rx / DC Orders ED Discharge Orders     None         Reinaldo Raddle 09/07/23 2117    Glyn Ade, MD 09/08/23 1725

## 2023-09-07 NOTE — ED Triage Notes (Signed)
Pt sent by ortho for further eval of right foot infection that has been treated with PO and IV abx in the past but is not getting any better. Ortho requesting a vascular consult. Pt c.o pain to the area.  Pt denies fever or chills.

## 2023-09-07 NOTE — Progress Notes (Signed)
ED Pharmacy Antibiotic Sign Off An antibiotic consult was received from an ED provider for vancomycin and cefepime per pharmacy dosing for wound infection. A chart review was completed to assess appropriateness.   The following one time order(s) were placed:  Cefepime 2g x1  Vancomycin 1500mg  x1   Further antibiotic and/or antibiotic pharmacy consults should be ordered by the admitting provider if indicated.   Thank you for allowing pharmacy to be a part of this patient's care.   Estill Batten, PharmD, BCCCP  Clinical Pharmacist 09/07/23 8:35 PM

## 2023-09-07 NOTE — ED Notes (Signed)
Attempted IV x3, unsuccessful. IV tea consult placed

## 2023-09-08 ENCOUNTER — Encounter (HOSPITAL_COMMUNITY): Payer: Medicare Other

## 2023-09-08 ENCOUNTER — Encounter (HOSPITAL_COMMUNITY): Payer: Self-pay | Admitting: Family Medicine

## 2023-09-08 ENCOUNTER — Inpatient Hospital Stay (HOSPITAL_COMMUNITY): Payer: Medicare Other

## 2023-09-08 DIAGNOSIS — N179 Acute kidney failure, unspecified: Secondary | ICD-10-CM | POA: Diagnosis not present

## 2023-09-08 DIAGNOSIS — L03119 Cellulitis of unspecified part of limb: Secondary | ICD-10-CM | POA: Diagnosis not present

## 2023-09-08 DIAGNOSIS — L97519 Non-pressure chronic ulcer of other part of right foot with unspecified severity: Secondary | ICD-10-CM

## 2023-09-08 HISTORY — DX: Acute kidney failure, unspecified: N17.9

## 2023-09-08 LAB — IRON AND TIBC
Iron: 27 ug/dL — ABNORMAL LOW (ref 28–170)
Saturation Ratios: 14 % (ref 10.4–31.8)
TIBC: 196 ug/dL — ABNORMAL LOW (ref 250–450)
UIBC: 169 ug/dL

## 2023-09-08 LAB — CBC
HCT: 26 % — ABNORMAL LOW (ref 36.0–46.0)
Hemoglobin: 8.4 g/dL — ABNORMAL LOW (ref 12.0–15.0)
MCH: 29.2 pg (ref 26.0–34.0)
MCHC: 32.3 g/dL (ref 30.0–36.0)
MCV: 90.3 fL (ref 80.0–100.0)
Platelets: 260 10*3/uL (ref 150–400)
RBC: 2.88 MIL/uL — ABNORMAL LOW (ref 3.87–5.11)
RDW: 15.9 % — ABNORMAL HIGH (ref 11.5–15.5)
WBC: 3.7 10*3/uL — ABNORMAL LOW (ref 4.0–10.5)
nRBC: 0 % (ref 0.0–0.2)

## 2023-09-08 LAB — BASIC METABOLIC PANEL
Anion gap: 8 (ref 5–15)
BUN: 14 mg/dL (ref 8–23)
CO2: 21 mmol/L — ABNORMAL LOW (ref 22–32)
Calcium: 7.5 mg/dL — ABNORMAL LOW (ref 8.9–10.3)
Chloride: 113 mmol/L — ABNORMAL HIGH (ref 98–111)
Creatinine, Ser: 0.78 mg/dL (ref 0.44–1.00)
GFR, Estimated: >60 mL/min (ref >60)
Glucose, Bld: 67 mg/dL — ABNORMAL LOW (ref 70–99)
Potassium: 4.6 mmol/L (ref 3.5–5.1)
Sodium: 142 mmol/L (ref 135–145)

## 2023-09-08 LAB — FERRITIN: Ferritin: 444 ng/mL — ABNORMAL HIGH (ref 11–307)

## 2023-09-08 MED ORDER — ENOXAPARIN SODIUM 40 MG/0.4ML IJ SOSY
40.0000 mg | PREFILLED_SYRINGE | Freq: Every day | INTRAMUSCULAR | Status: DC
  Administered 2023-09-08 – 2023-09-10 (×3): 40 mg via SUBCUTANEOUS
  Filled 2023-09-08 (×3): qty 0.4

## 2023-09-08 MED ORDER — SILVER SULFADIAZINE 1 % EX CREA
TOPICAL_CREAM | Freq: Every day | CUTANEOUS | Status: DC
  Administered 2023-09-14 – 2023-09-15 (×2): 1 via TOPICAL
  Filled 2023-09-08 (×4): qty 85

## 2023-09-08 MED ORDER — GADOBUTROL 1 MMOL/ML IV SOLN
6.0000 mL | Freq: Once | INTRAVENOUS | Status: AC | PRN
  Administered 2023-09-08: 6 mL via INTRAVENOUS

## 2023-09-08 MED ORDER — VANCOMYCIN HCL 750 MG/150ML IV SOLN
750.0000 mg | INTRAVENOUS | Status: DC
  Administered 2023-09-09 – 2023-09-15 (×7): 750 mg via INTRAVENOUS
  Filled 2023-09-08 (×7): qty 150

## 2023-09-08 MED ORDER — METRONIDAZOLE 500 MG/100ML IV SOLN
500.0000 mg | Freq: Two times a day (BID) | INTRAVENOUS | Status: DC
  Administered 2023-09-08 – 2023-09-13 (×10): 500 mg via INTRAVENOUS
  Filled 2023-09-08 (×11): qty 100

## 2023-09-08 MED ORDER — SODIUM CHLORIDE 0.9 % IV SOLN
2.0000 g | Freq: Two times a day (BID) | INTRAVENOUS | Status: DC
  Administered 2023-09-08 – 2023-09-13 (×11): 2 g via INTRAVENOUS
  Filled 2023-09-08 (×11): qty 12.5

## 2023-09-08 MED ORDER — KETOROLAC TROMETHAMINE 15 MG/ML IJ SOLN
15.0000 mg | Freq: Once | INTRAMUSCULAR | Status: AC
  Administered 2023-09-08: 15 mg via INTRAVENOUS
  Filled 2023-09-08 (×2): qty 1

## 2023-09-08 NOTE — ED Notes (Signed)
ED TO INPATIENT HANDOFF REPORT  ED Nurse Name and Phone #: Marcelino Duster [phone]  S Name/Age/Gender Isabel Barnes 82 y.o. female Room/Bed: 038C/038C  Code Status   Code Status: Limited: Do not attempt resuscitation (DNR) -DNR-LIMITED -Do Not Intubate/DNI   Home/SNF/Other Home Patient oriented to: self, place, time, and situation Is this baseline? Yes   Triage Complete: Triage complete  Chief Complaint Cellulitis [L03.90]  Triage Note Pt sent by ortho for further eval of right foot infection that has been treated with PO and IV abx in the past but is not getting any better. Ortho requesting a vascular consult. Pt c.o pain to the area.  Pt denies fever or chills.   Allergies Allergies  Allergen Reactions   Dilaudid [Hydromorphone Hcl] Shortness Of Breath   Gabapentin Other (See Comments)    Hoarseness , headache and sore throat   Latex Rash    Severe rash   Lyrica [Pregabalin] Other (See Comments)    No balance , had to walk with cane , Blurred vision,weakness.   Oxycodone Shortness Of Breath and Other (See Comments)    Tolerated oxycodone during admission to hospital 02/06/22   Singulair [Montelukast] Shortness Of Breath and Other (See Comments)    Vision issues, also   Ciprofloxacin Hcl Nausea And Vomiting and Other (See Comments)    Nausea and vomiting with by mouth form   Codeine Other (See Comments)    Hallucinations   Methadone Nausea And Vomiting and Other (See Comments)    Severe nausea and vomiting   Metronidazole Nausea And Vomiting and Other (See Comments)    Gastric pain   Oysters [Shellfish Allergy] Other (See Comments)    "Terrible gastric upset and cramping."   Clindamycin/Lincomycin Diarrhea and Nausea Only   Donepezil Diarrhea and Other (See Comments)    Severe diarrhea   Sulfa Antibiotics Diarrhea and Other (See Comments)    GI issues    Tape Other (See Comments)    ADHESIVE TAPE-Severe rash   Zetia [Ezetimibe] Diarrhea   Elemental Sulfur  Nausea And Vomiting   Iodine Rash   Other Rash    All Antibiotic ointments/ creams   Oyster Shell Rash   Penicillin G Nausea And Vomiting and Rash   Penicillins Nausea And Vomiting and Rash    Has patient had a PCN reaction causing immediate rash, facial/tongue/throat swelling, SOB or lightheadedness with hypotension: Yes Has patient had a PCN reaction causing severe rash involving mucus membranes or skin necrosis: No Has patient had a PCN reaction that required hospitalization No Has patient had a PCN reaction occurring within the last 10 years: No If all of the above answers are "NO", then may proceed with Cephalosporin use.    Povidone Iodine Rash and Other (See Comments)    Oyster shell products- Rash    Skintegrity Hydrogel [Skin Protectants, Misc.] Rash   Tapentadol Other (See Comments)    Nightmares **Nucynta**    Level of Care/Admitting Diagnosis ED Disposition     ED Disposition  Admit   Condition  --   Comment  Hospital Area[facility] [[id number]]  Level of Care: Med-Surg [[id number]]  May admit patient to [facility] or [facility] if equivalent level of care is available:: No  Covid Evaluation: Asymptomatic - no recent exposure (last 10 days) testing not required  Diagnosis: Cellulitis [454098]  Admitting Physician: Cyndia Skeeters [[id number]]  Attending Physician: Doreene Eland [[id number]]  Certification:: I certify this patient will need inpatient services for  at least 2 midnights  Expected Medical Readiness: 09/09/2023          B Medical/Surgery History Past Medical History:  Diagnosis Date   Anemia    iron deficiency hx.has had iron infusions before    Chronic low back pain    Chronic pain disorder    Complication of anesthesia    severe claustrophobia   Constipation    r/t use of pain meds.Takes OTC meds or eats prunes   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HCC)    Unsteady gait    occasionally   Urinary urgency    Past Surgical History:  Procedure Laterality Date   ABDOMINAL AORTOGRAM W/LOWER EXTREMITY N/A 02/15/2022   Procedure: ABDOMINAL AORTOGRAM W/LOWER EXTREMITY;  Surgeon: Maeola Harman, MD;  Location: [facility] INVASIVE CV LAB;  Service: Cardiovascular;  Laterality: N/A;   ABDOMINAL HYSTERECTOMY     ANTERIOR FUSION CERVICAL SPINE     x2 -C4-7   APPLICATION OF WOUND VAC Right 03/12/2022   Procedure: APPLICATION OF WOUND VAC;  Surgeon: Nadara Mustard, MD;  Location: [facility] OR;  Service: Orthopedics;  Laterality: Right;   BUNIONECTOMY Bilateral    COLONOSCOPY     CYSTOSCOPY W/ URETEROSCOPY  2012   EYE SURGERY Bilateral    cataract /lens implant   HOLMIUM LASER APPLICATION Left 02/08/2013   Procedure: HOLMIUM LASER APPLICATION;  Surgeon: Anner Crete, MD;  [facility]: [facility];  Service: Urology;  Laterality: Left;   I & D EXTREMITY Right 02/10/2022   Procedure: DEBRIDEMENT RIGHT LEG ABSCESS;  Surgeon: Nadara Mustard, MD;  Location: [facility] OR;  Service: Orthopedics;  Laterality: Right;   I & D EXTREMITY Right 03/12/2022   Procedure: RIGHT LEG IRRIGATION AND DEBRIDEMENT EXTREMITY;  Surgeon: Nadara Mustard, MD;  Location: [facility] OR;  Service: Orthopedics;  Laterality: Right;    INSERTION OF MESH N/A 07/15/2014   Procedure: INSERTION OF MESH;  Surgeon: Ardeth Sportsman, MD;  Location: [facility] OR;  Service: General;  Laterality: N/A;   JOINT REPLACEMENT Right 2012   shoulder   LAPAROSCOPIC CHOLECYSTECTOMY W/ CHOLANGIOGRAPHY  2012   Dr Magnus Ivan   NASAL SINUS SURGERY     OPEN REDUCTION INTERNAL FIXATION (ORIF) DISTAL RADIAL FRACTURE Left 03/20/2021   Procedure: OPEN REDUCTION INTERNAL FIXATION (ORIF) DISTAL RADIAL FRACTURE;  Surgeon: Teryl Lucy, MD;  Location: [facility] OR;  Service: Orthopedics;  Laterality: Left;   RADIOLOGY WITH ANESTHESIA N/A 05/09/2014   Procedure: ADULT SEDATION WITH ANESTHESIA/MRI CERVICAL SPINE WITHOUT CONTRAST;  Surgeon: Medication  Radiologist, MD;  Location: [facility] OR;  Service: Radiology;  Laterality: N/A;  DR. HAWKS/MRI   right knee arthroscopy     d/t meniscal tear   SHOULDER ARTHROSCOPY W/ ROTATOR CUFF REPAIR Bilateral three times each over several yrs   SKIN FULL THICKNESS GRAFT Right 03/12/2022   Procedure: SKIN GRAFT FULL THICKNESS;  Surgeon: Nadara Mustard, MD;  Location: [facility] OR;  Service: Orthopedics;  Laterality: Right;   SKIN SPLIT GRAFT Right 02/17/2022   Procedure: RIGHT LEG SKIN GRAFT;  Surgeon: Nadara Mustard, MD;  Location: [facility] OR;  Service: Orthopedics;  Laterality: Right;   THUMB ARTHROSCOPY Left    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   Procedure: RIGHT TOTAL KNEE ARTHROPLASTY;  Surgeon: Jodi Geralds, MD;  Location: [facility] OR;  Service: Orthopedics;  Laterality: Right;   UMBILICAL HERNIA REPAIR N/A 07/15/2014   Procedure: LAPAROSCOPIC UMBILICAL AND INFRAUMBILICAL HERNIA;  Surgeon: Ardeth Sportsman, MD;  Location: [facility] OR;  Service: General;  Laterality: N/A;     A IV Location/Drains/Wounds Patient Lines/Drains/Airways Status     Active Line/Drains/Airways     Name Placement date Placement time Site Days   Peripheral IV 09/07/23 20 G 1" Anterior;Right Forearm 09/07/23  2211  Forearm  1   Negative Pressure Wound Therapy Pretibial Right  03/12/22  0910  --  545   Incision (Closed) 03/12/22 Leg Right 03/12/22  0850  -- 545   Wound / Incision (Open or Dehisced) 09/08/23 Other (Comment) Foot Right 09/08/23  --  Foot  less than 1   Wound / Incision (Open or Dehisced) 09/08/23 Leg Right 09/08/23  --  Leg  less than 1            Intake/Output Last 24 hours  Intake/Output Summary (Last 24 hours) at 09/08/2023 1301 Last data filed at 09/08/2023 0505 Gross per 24 hour  Intake 350 ml  Output --  Net 350 ml    Labs/Imaging Results for orders placed or performed during the hospital encounter of 09/07/23 (from the past 48 hour(s))  Comprehensive metabolic panel     Status: Abnormal   Collection Time: 09/07/23  2:04 PM  Result Value Ref Range   Sodium 142 135 - 145 mmol/L   Potassium 3.9 3.5 - 5.1 mmol/L   Chloride 106 98 - 111 mmol/L   CO2 26 22 - 32 mmol/L   Glucose, Bld 137 (H) 70 - 99 mg/dL    Comment: Glucose reference range applies only to samples taken after fasting for at least 8 hours.   BUN 21 8 - 23 mg/dL   Creatinine, Ser 0.86 (H) 0.44 - 1.00 mg/dL   Calcium 9.1 8.9 - 57.8 mg/dL   Total Protein 6.3 (L) 6.5 - 8.1 g/dL   Albumin 3.5 3.5 - 5.0 g/dL   AST 30 15 - 41 U/L   ALT 29 0 - 44 U/L   Alkaline Phosphatase 103 38 - 126 U/L   Total Bilirubin 0.6 0.3 - 1.2 mg/dL   GFR, Estimated 46 (L) >60 mL/min    Comment: (NOTE) Calculated using the CKD-EPI Creatinine Equation (2021)    Anion gap 10 5 - 15    Comment: Performed at [facility], [address], [place], [place] [address]  CBC with Differential     Status: Abnormal   Collection Time: 09/07/23  2:04 PM  Result Value Ref Range   WBC 5.5 4.0 - 10.5 K/uL   RBC 3.52 (L) 3.87 - 5.11 MIL/uL  Hemoglobin 10.2 (L) 12.0 - 15.0 g/dL   HCT 69.6 (L) 29.5 - 28.4 %   MCV 89.5 80.0 - 100.0 fL   MCH 29.0 26.0 - 34.0 pg   MCHC 32.4 30.0 - 36.0 g/dL   RDW 13.2 44.0 - 10.2 %   Platelets 301 150 - 400 K/uL   nRBC 0.0 0.0 - 0.2 %   Neutrophils Relative % 65  %   Neutro Abs 3.6 1.7 - 7.7 K/uL   Lymphocytes Relative 15 %   Lymphs Abs 0.8 0.7 - 4.0 K/uL   Monocytes Relative 9 %   Monocytes Absolute 0.5 0.1 - 1.0 K/uL   Eosinophils Relative 10 %   Eosinophils Absolute 0.6 (H) 0.0 - 0.5 K/uL   Basophils Relative 1 %   Basophils Absolute 0.1 0.0 - 0.1 K/uL   Immature Granulocytes 0 %   Abs Immature Granulocytes 0.02 0.00 - 0.07 K/uL    Comment: Performed at [facility], [address] [place], [place] [address]  Protime-INR     Status: None   Collection Time: 09/07/23  2:04 PM  Result Value Ref Range   Prothrombin Time 13.2 11.4 - 15.2 seconds   INR 1.0 0.8 - 1.2    Comment: (NOTE) INR goal varies based on device and disease states. Performed at [facility], [address], [place], [place] [address]   I-Stat Lactic Acid, ED     Status: None   Collection Time: 09/07/23  2:22 PM  Result Value Ref Range   Lactic Acid, Venous 1.6 0.5 - 1.9 mmol/L  I-Stat Lactic Acid, ED     Status: None   Collection Time: 09/07/23  6:49 PM  Result Value Ref Range   Lactic Acid, Venous 1.0 0.5 - 1.9 mmol/L  CBC     Status: Abnormal   Collection Time: 09/08/23  4:25 AM  Result Value Ref Range   WBC 3.7 (L) 4.0 - 10.5 K/uL   RBC 2.88 (L) 3.87 - 5.11 MIL/uL   Hemoglobin 8.4 (L) 12.0 - 15.0 g/dL   HCT 47.4 (L) 25.9 - 56.3 %   MCV 90.3 80.0 - 100.0 fL   MCH 29.2 26.0 - 34.0 pg   MCHC 32.3 30.0 - 36.0 g/dL   RDW 87.5 (H) 64.3 - 32.9 %   Platelets 260 150 - 400 K/uL   nRBC 0.0 0.0 - 0.2 %    Comment: Performed at [facility], [address] [place], [place] [address]  Basic metabolic panel     Status: Abnormal   Collection Time: 09/08/23  5:25 AM  Result Value Ref Range   Sodium 142 135 - 145 mmol/L   Potassium 4.6 3.5 - 5.1 mmol/L    Comment: HEMOLYSIS AT THIS LEVEL MAY AFFECT RESULT   Chloride 113 (H) 98 - 111 mmol/L   CO2 21 (L) 22 - 32 mmol/L   Glucose, Bld 67 (L) 70 - 99 mg/dL    Comment: Glucose reference range applies  only to samples taken after fasting for at least 8 hours.   BUN 14 8 - 23 mg/dL   Creatinine, Ser 1.66 0.44 - 1.00 mg/dL   Calcium 7.5 (L) 8.9 - 10.3 mg/dL   GFR, Estimated >06 >30 mL/min    Comment: (NOTE) Calculated using the CKD-EPI Creatinine Equation (2021)    Anion gap 8 5 - 15    Comment: Performed at [facility], [address], [place], [place] [address]  DG Ankle Complete Right  Result Date: 09/07/2023 CLINICAL DATA:  Shin and heel wounds.  Infection. EXAM: RIGHT ANKLE - COMPLETE 3+ VIEW; RIGHT TIBIA AND FIBULA - 2 VIEW COMPARISON:  None Available. FINDINGS: Ankle: Plantar soft tissue defect typical of ulcer. This appears superficial by radiograph. There is generalized soft tissue edema about the ankle. No fracture, erosion, bony destructive change or periostitis. No radiopaque foreign body. Tibia/fibula: Wound about the medial aspect of the lower leg. There is this subjacent cortical thickening in the mid aspect of the tibia. Multiple surgical clips in the adjacent soft tissues. No frank bony destructive change or erosions. Generalized soft tissue edema. No fracture. Knee arthroplasty is partially included. IMPRESSION: 1. Wound about the medial aspect of the lower leg. Cortical thickening in the mid aspect of the tibia at the level of wound may be sequela of prior infection. Consider MRI for further assessment of acute infection. 2. Plantar soft tissue defect typical of ulcer. Generalized soft tissue edema about the ankle and lower leg. Electronically Signed   By: Narda Rutherford M.D.   On: 09/07/2023 20:59   DG Tibia/Fibula Right  Result Date: 09/07/2023 CLINICAL DATA:  Shin and heel wounds.  Infection. EXAM: RIGHT ANKLE - COMPLETE 3+ VIEW; RIGHT TIBIA AND FIBULA - 2 VIEW COMPARISON:  None Available. FINDINGS: Ankle: Plantar soft tissue defect typical of ulcer. This appears superficial by radiograph. There is generalized soft tissue edema about the ankle. No fracture, erosion,  bony destructive change or periostitis. No radiopaque foreign body. Tibia/fibula: Wound about the medial aspect of the lower leg. There is this subjacent cortical thickening in the mid aspect of the tibia. Multiple surgical clips in the adjacent soft tissues. No frank bony destructive change or erosions. Generalized soft tissue edema. No fracture. Knee arthroplasty is partially included. IMPRESSION: 1. Wound about the medial aspect of the lower leg. Cortical thickening in the mid aspect of the tibia at the level of wound may be sequela of prior infection. Consider MRI for further assessment of acute infection. 2. Plantar soft tissue defect typical of ulcer. Generalized soft tissue edema about the ankle and lower leg. Electronically Signed   By: Narda Rutherford M.D.   On: 09/07/2023 20:59    Pending Labs Unresulted Labs (From admission, onward)     Start     Ordered   09/08/23 1113  Ferritin  Add-on,   AD        09/08/23 1112   09/08/23 1113  Iron and TIBC  Add-on,   AD        09/08/23 1112   09/07/23 1356  Culture, blood (Routine x 2)  BLOOD CULTURE X 2,   R (with STAT occurrences)      09/07/23 1356            Vitals/Pain Today's Vitals   09/08/23 0647 09/08/23 0700 09/08/23 1045 09/08/23 1048  BP: (!) 95/50 (!) 109/54 (!) 151/77   Pulse: 71 71 80   Resp: 17  (!) 21   Temp:  98.6 F (37 C)  98.1 F (36.7 C)  TempSrc:    Oral  SpO2: 95% 96% 97%   Weight:      Height:      PainSc:        Isolation Precautions No active isolations  Medications Medications  levothyroxine (SYNTHROID) tablet 50 mcg (50 mcg Oral Given 09/08/23 0536)  pantoprazole (PROTONIX) EC tablet 40 mg (40 mg Oral Given 09/08/23 1008)  aspirin EC tablet  81 mg (81 mg Oral Given 09/08/23 1008)  acetaminophen (TYLENOL) tablet 1,000 mg (1,000 mg Oral Given 09/08/23 1007)  HYDROcodone-acetaminophen (NORCO) 10-325 MG per tablet 1 tablet (1 tablet Oral Given 09/08/23 1123)  polyethylene glycol (MIRALAX /  GLYCOLAX) packet 17 g (17 g Oral Given 09/08/23 1013)  melatonin tablet 3 mg (3 mg Oral Given 09/08/23 0151)  multivitamin with minerals tablet (1 tablet Oral Given 09/08/23 1008)  fluticasone (FLONASE) 50 MCG/ACT nasal spray 1-2 spray (has no administration in time range)  loratadine (CLARITIN) tablet 10 mg (10 mg Oral Given 09/08/23 1008)  cycloSPORINE (RESTASIS) 0.05 % ophthalmic emulsion 1 drop (1 drop Both Eyes Given 09/08/23 1009)  enoxaparin (LOVENOX) injection 40 mg (has no administration in time range)  metroNIDAZOLE (FLAGYL) IVPB 500 mg (0 mg Intravenous Stopped 09/08/23 1124)  vancomycin (VANCOREADY) IVPB 750 mg/150 mL (has no administration in time range)  silver sulfADIAZINE (SILVADENE) 1 % cream ( Topical Given 09/08/23 1009)  ceFEPIme (MAXIPIME) 2 g in sodium chloride 0.9 % 100 mL IVPB (0 g Intravenous Stopped 09/08/23 1157)  ceFEPIme (MAXIPIME) 2 g in sodium chloride 0.9 % 100 mL IVPB (0 g Intravenous Stopped 09/08/23 0102)  vancomycin (VANCOREADY) IVPB 1250 mg/250 mL (0 mg Intravenous Stopped 09/08/23 0505)    Mobility walks with device     Focused Assessments Wound to right calf, Silvadene placed on wound, and ulcered area to right heel.  Wrapped with kling and ace bandage.    No drainage noted from wounds.  Patient able to stand and pivot to bedside commode, and is able to bear weight on right foot it just hurts to do so.      R Recommendations: See Admitting Provider Note  Report given to:   Additional Notes:

## 2023-09-08 NOTE — ED Notes (Signed)
ED TO INPATIENT HANDOFF REPORT  ED Nurse Name and Phone #: Rodney Booze [phone]  S Name/Age/Gender Isabel Barnes 82 y.o. female Room/Bed: 027C/027C  Code Status   Code Status: Limited: Do not attempt resuscitation (DNR) -DNR-LIMITED -Do Not Intubate/DNI   Home/SNF/Other Home Patient oriented to: self, place, time, and situation Is this baseline? Yes   Triage Complete: Triage complete  Chief Complaint Cellulitis [L03.90]  Triage Note Pt sent by ortho for further eval of right foot infection that has been treated with PO and IV abx in the past but is not getting any better. Ortho requesting a vascular consult. Pt c.o pain to the area.  Pt denies fever or chills.   Allergies Allergies  Allergen Reactions   Dilaudid [Hydromorphone Hcl] Shortness Of Breath   Gabapentin Other (See Comments)    Hoarseness , headache and sore throat   Latex Rash    Severe rash   Lyrica [Pregabalin] Other (See Comments)    No balance , had to walk with cane , Blurred vision,weakness.   Oxycodone Shortness Of Breath and Other (See Comments)    Tolerated oxycodone during admission to hospital 02/06/22   Singulair [Montelukast] Shortness Of Breath and Other (See Comments)    Vision issues, also   Ciprofloxacin Hcl Nausea And Vomiting and Other (See Comments)    Nausea and vomiting with by mouth form   Codeine Other (See Comments)    Hallucinations   Methadone Nausea And Vomiting and Other (See Comments)    Severe nausea and vomiting   Metronidazole Nausea And Vomiting and Other (See Comments)    Gastric pain   Oysters [Shellfish Allergy] Other (See Comments)    "Terrible gastric upset and cramping."   Clindamycin/Lincomycin Diarrhea and Nausea Only   Donepezil Diarrhea and Other (See Comments)    Severe diarrhea   Sulfa Antibiotics Diarrhea and Other (See Comments)    GI issues    Tape Other (See Comments)    ADHESIVE TAPE-Severe rash   Zetia [Ezetimibe] Diarrhea   Elemental Sulfur  Nausea And Vomiting   Iodine Rash   Other Rash    All Antibiotic ointments/ creams   Oyster Shell Rash   Penicillin G Nausea And Vomiting and Rash   Penicillins Nausea And Vomiting and Rash    Has patient had a PCN reaction causing immediate rash, facial/tongue/throat swelling, SOB or lightheadedness with hypotension: Yes Has patient had a PCN reaction causing severe rash involving mucus membranes or skin necrosis: No Has patient had a PCN reaction that required hospitalization No Has patient had a PCN reaction occurring within the last 10 years: No If all of the above answers are "NO", then may proceed with Cephalosporin use.    Povidone Iodine Rash and Other (See Comments)    Oyster shell products- Rash    Skintegrity Hydrogel [Skin Protectants, Misc.] Rash   Tapentadol Other (See Comments)    Nightmares **Nucynta**    Level of Care/Admitting Diagnosis ED Disposition     ED Disposition  Admit   Condition  --   Comment  Hospital [facility] [[id number]]  Level of Care: Med-Surg [[id number]]  May admit patient to [facility] or [facility] if equivalent level of care is available:: No  Covid Evaluation: Asymptomatic - no recent exposure (last 10 days) testing not required  Diagnosis: Cellulitis [098119]  Admitting Physician: Cyndia Skeeters [[id number]]  Attending Physician: Doreene Eland [[id number]]  Certification:: I certify this patient will need inpatient services for  at least 2 midnights  Expected Medical Readiness: 09/09/2023          B Medical/Surgery History Past Medical History:  Diagnosis Date   Anemia    iron deficiency hx.has had iron infusions before    Chronic low back pain    Chronic pain disorder    Complication of anesthesia    severe claustrophobia   Constipation    r/t use of pain meds.Takes OTC meds or eats prunes   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HCC)    Unsteady gait    occasionally   Urinary urgency    Past Surgical History:  Procedure Laterality Date   ABDOMINAL AORTOGRAM W/LOWER EXTREMITY N/A 02/15/2022   Procedure: ABDOMINAL AORTOGRAM W/LOWER EXTREMITY;  Surgeon: Maeola Harman, MD;  Location: [facility] INVASIVE CV LAB;  Service: Cardiovascular;  Laterality: N/A;   ABDOMINAL HYSTERECTOMY     ANTERIOR FUSION CERVICAL SPINE     x2 -C4-7   APPLICATION OF WOUND VAC Right 03/12/2022   Procedure: APPLICATION OF WOUND VAC;  Surgeon: Nadara Mustard, MD;  Location: [facility] OR;  Service: Orthopedics;  Laterality: Right;   BUNIONECTOMY Bilateral    COLONOSCOPY     CYSTOSCOPY W/ URETEROSCOPY  2012   EYE SURGERY Bilateral    cataract /lens implant   HOLMIUM LASER APPLICATION Left 02/08/2013   Procedure: HOLMIUM LASER APPLICATION;  Surgeon: Anner Crete, MD;  Location: [facility];  Service: Urology;  Laterality: Left;   I & D EXTREMITY Right 02/10/2022   Procedure: DEBRIDEMENT RIGHT LEG ABSCESS;  Surgeon: Nadara Mustard, MD;  Location: [facility] OR;  Service: Orthopedics;  Laterality: Right;   I & D EXTREMITY Right 03/12/2022   Procedure: RIGHT LEG IRRIGATION AND DEBRIDEMENT EXTREMITY;  Surgeon: Nadara Mustard, MD;  Location: [facility] OR;  Service: Orthopedics;  Laterality: Right;    INSERTION OF MESH N/A 07/15/2014   Procedure: INSERTION OF MESH;  Surgeon: Ardeth Sportsman, MD;  Location: [facility] OR;  Service: General;  Laterality: N/A;   JOINT REPLACEMENT Right 2012   shoulder   LAPAROSCOPIC CHOLECYSTECTOMY W/ CHOLANGIOGRAPHY  2012   Dr Magnus Ivan   NASAL SINUS SURGERY     OPEN REDUCTION INTERNAL FIXATION (ORIF) DISTAL RADIAL FRACTURE Left 03/20/2021   Procedure: OPEN REDUCTION INTERNAL FIXATION (ORIF) DISTAL RADIAL FRACTURE;  Surgeon: Teryl Lucy, MD;  Location: [facility] OR;  Service: Orthopedics;  Laterality: Left;   RADIOLOGY WITH ANESTHESIA N/A 05/09/2014   Procedure: ADULT SEDATION WITH ANESTHESIA/MRI CERVICAL SPINE WITHOUT CONTRAST;  Surgeon: Medication  Radiologist, MD;  Location: [facility] OR;  Service: Radiology;  Laterality: N/A;  DR. HAWKS/MRI   right knee arthroscopy     d/t meniscal tear   SHOULDER ARTHROSCOPY W/ ROTATOR CUFF REPAIR Bilateral three times each over several yrs   SKIN FULL THICKNESS GRAFT Right 03/12/2022   Procedure: SKIN GRAFT FULL THICKNESS;  Surgeon: Nadara Mustard, MD;  Location: [facility] OR;  Service: Orthopedics;  Laterality: Right;   SKIN SPLIT GRAFT Right 02/17/2022   Procedure: RIGHT LEG SKIN GRAFT;  Surgeon: Nadara Mustard, MD;  Location: [facility] OR;  Service: Orthopedics;  Laterality: Right;   THUMB ARTHROSCOPY Left    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   Procedure: RIGHT TOTAL KNEE ARTHROPLASTY;  Surgeon: Jodi Geralds, MD;  Location: [facility] OR;  Service: Orthopedics;  Laterality: Right;   UMBILICAL HERNIA REPAIR N/A 07/15/2014   Procedure: LAPAROSCOPIC UMBILICAL AND INFRAUMBILICAL HERNIA;  Surgeon: Ardeth Sportsman, MD;  Location: [facility] OR;  Service: General;  Laterality: N/A;     A IV Location/Drains/Wounds Patient Lines/Drains/Airways Status     Active Line/Drains/Airways     Name Placement date Placement time Site Days   Peripheral IV 09/07/23 20 G 1" Anterior;Right Forearm 09/07/23  2211  Forearm  1   Negative Pressure Wound Therapy Pretibial Right  03/12/22  0910  --  545   Incision (Closed) 03/12/22 Leg Right 03/12/22  0850  -- 545            Intake/Output Last 24 hours  Intake/Output Summary (Last 24 hours) at 09/08/2023 0603 Last data filed at 09/08/2023 0505 Gross per 24 hour  Intake 350 ml  Output --  Net 350 ml    Labs/Imaging Results for orders placed or performed during the hospital encounter of 09/07/23 (from the past 48 hour(s))  Comprehensive metabolic panel     Status: Abnormal   Collection Time: 09/07/23  2:04 PM  Result Value Ref Range   Sodium 142 135 - 145 mmol/L   Potassium 3.9 3.5 - 5.1 mmol/L   Chloride 106 98 - 111 mmol/L   CO2 26 22 - 32 mmol/L   Glucose, Bld 137 (H) 70 - 99 mg/dL    Comment: Glucose reference range applies only to samples taken after fasting for at least 8 hours.   BUN 21 8 - 23 mg/dL   Creatinine, Ser 1.61 (H) 0.44 - 1.00 mg/dL   Calcium 9.1 8.9 - 09.6 mg/dL   Total Protein 6.3 (L) 6.5 - 8.1 g/dL   Albumin 3.5 3.5 - 5.0 g/dL   AST 30 15 - 41 U/L   ALT 29 0 - 44 U/L   Alkaline Phosphatase 103 38 - 126 U/L   Total Bilirubin 0.6 0.3 - 1.2 mg/dL   GFR, Estimated 46 (L) >60 mL/min    Comment: (NOTE) Calculated using the CKD-EPI Creatinine Equation (2021)    Anion gap 10 5 - 15    Comment: Performed at [facility], [address] [place], [place] [address]  CBC with Differential     Status: Abnormal   Collection Time: 09/07/23  2:04 PM  Result Value Ref Range   WBC 5.5 4.0 - 10.5 K/uL   RBC 3.52 (L) 3.87 - 5.11 MIL/uL   Hemoglobin 10.2 (L) 12.0 - 15.0 g/dL   HCT 98.1 (L) 19.1 - 47.8 %   MCV 89.5 80.0 - 100.0 fL   MCH 29.0 26.0 - 34.0 pg   MCHC 32.4 30.0 - 36.0 g/dL  RDW 14.5 11.5 - 15.5 %   Platelets 301 150 - 400 K/uL   nRBC 0.0 0.0 - 0.2 %   Neutrophils Relative % 65 %   Neutro Abs 3.6 1.7 - 7.7 K/uL   Lymphocytes Relative 15 %   Lymphs Abs 0.8 0.7 - 4.0 K/uL   Monocytes Relative 9 %   Monocytes Absolute 0.5 0.1 - 1.0 K/uL   Eosinophils Relative 10 %    Eosinophils Absolute 0.6 (H) 0.0 - 0.5 K/uL   Basophils Relative 1 %   Basophils Absolute 0.1 0.0 - 0.1 K/uL   Immature Granulocytes 0 %   Abs Immature Granulocytes 0.02 0.00 - 0.07 K/uL    Comment: Performed at [facility], [address], [place], [place] [address]  Protime-INR     Status: None   Collection Time: 09/07/23  2:04 PM  Result Value Ref Range   Prothrombin Time 13.2 11.4 - 15.2 seconds   INR 1.0 0.8 - 1.2    Comment: (NOTE) INR goal varies based on device and disease states. Performed at [facility], [address] [place], [place] [address]   I-Stat Lactic Acid, ED     Status: None   Collection Time: 09/07/23  2:22 PM  Result Value Ref Range   Lactic Acid, Venous 1.6 0.5 - 1.9 mmol/L  I-Stat Lactic Acid, ED     Status: None   Collection Time: 09/07/23  6:49 PM  Result Value Ref Range   Lactic Acid, Venous 1.0 0.5 - 1.9 mmol/L  CBC     Status: Abnormal   Collection Time: 09/08/23  4:25 AM  Result Value Ref Range   WBC 3.7 (L) 4.0 - 10.5 K/uL   RBC 2.88 (L) 3.87 - 5.11 MIL/uL   Hemoglobin 8.4 (L) 12.0 - 15.0 g/dL   HCT 96.2 (L) 95.2 - 84.1 %   MCV 90.3 80.0 - 100.0 fL   MCH 29.2 26.0 - 34.0 pg   MCHC 32.3 30.0 - 36.0 g/dL   RDW 32.4 (H) 40.1 - 02.7 %   Platelets 260 150 - 400 K/uL   nRBC 0.0 0.0 - 0.2 %    Comment: Performed at [facility], [address] [place], [place] [address]   DG Ankle Complete Right  Result Date: 09/07/2023 CLINICAL DATA:  Shin and heel wounds.  Infection. EXAM: RIGHT ANKLE - COMPLETE 3+ VIEW; RIGHT TIBIA AND FIBULA - 2 VIEW COMPARISON:  None Available. FINDINGS: Ankle: Plantar soft tissue defect typical of ulcer. This appears superficial by radiograph. There is generalized soft tissue edema about the ankle. No fracture, erosion, bony destructive change or periostitis. No radiopaque foreign body. Tibia/fibula: Wound about the medial aspect of the lower leg. There is this subjacent cortical thickening in the mid  aspect of the tibia. Multiple surgical clips in the adjacent soft tissues. No frank bony destructive change or erosions. Generalized soft tissue edema. No fracture. Knee arthroplasty is partially included. IMPRESSION: 1. Wound about the medial aspect of the lower leg. Cortical thickening in the mid aspect of the tibia at the level of wound may be sequela of prior infection. Consider MRI for further assessment of acute infection. 2. Plantar soft tissue defect typical of ulcer. Generalized soft tissue edema about the ankle and lower leg. Electronically Signed   By: Narda Rutherford M.D.   On: 09/07/2023 20:59   DG Tibia/Fibula Right  Result Date: 09/07/2023 CLINICAL DATA:  Shin and heel wounds.  Infection.  EXAM: RIGHT ANKLE - COMPLETE 3+ VIEW; RIGHT TIBIA AND FIBULA - 2 VIEW COMPARISON:  None Available. FINDINGS: Ankle: Plantar soft tissue defect typical of ulcer. This appears superficial by radiograph. There is generalized soft tissue edema about the ankle. No fracture, erosion, bony destructive change or periostitis. No radiopaque foreign body. Tibia/fibula: Wound about the medial aspect of the lower leg. There is this subjacent cortical thickening in the mid aspect of the tibia. Multiple surgical clips in the adjacent soft tissues. No frank bony destructive change or erosions. Generalized soft tissue edema. No fracture. Knee arthroplasty is partially included. IMPRESSION: 1. Wound about the medial aspect of the lower leg. Cortical thickening in the mid aspect of the tibia at the level of wound may be sequela of prior infection. Consider MRI for further assessment of acute infection. 2. Plantar soft tissue defect typical of ulcer. Generalized soft tissue edema about the ankle and lower leg. Electronically Signed   By: Narda Rutherford M.D.   On: 09/07/2023 20:59    Pending Labs Unresulted Labs (From admission, onward)     Start     Ordered   09/08/23 0510  Basic metabolic panel  Once,   R        09/08/23  0510   09/07/23 1356  Culture, blood (Routine x 2)  BLOOD CULTURE X 2,   R (with STAT occurrences)      09/07/23 1356            Vitals/Pain Today's Vitals   09/08/23 0240 09/08/23 0245 09/08/23 0259 09/08/23 0538  BP: (!) 117/56 (!) 110/47    Pulse: 66 66    Resp:  18    Temp:   98.6 F (37 C)   TempSrc:   Oral   SpO2: 96% 97%    Weight:      Height:      PainSc:    9     Isolation Precautions No active isolations  Medications Medications  levothyroxine (SYNTHROID) tablet 50 mcg (50 mcg Oral Given 09/08/23 0536)  pantoprazole (PROTONIX) EC tablet 40 mg (40 mg Oral Given 09/07/23 2248)  aspirin EC tablet 81 mg (has no administration in time range)  acetaminophen (TYLENOL) tablet 1,000 mg (1,000 mg Oral Given 09/07/23 2248)  HYDROcodone-acetaminophen (NORCO) 10-325 MG per tablet 1 tablet (1 tablet Oral Given 09/08/23 0536)  polyethylene glycol (MIRALAX / GLYCOLAX) packet 17 g (has no administration in time range)  melatonin tablet 3 mg (3 mg Oral Given 09/08/23 0151)  multivitamin with minerals tablet (has no administration in time range)  fluticasone (FLONASE) 50 MCG/ACT nasal spray 1-2 spray (has no administration in time range)  loratadine (CLARITIN) tablet 10 mg (has no administration in time range)  cycloSPORINE (RESTASIS) 0.05 % ophthalmic emulsion 1 drop (has no administration in time range)  enoxaparin (LOVENOX) injection 30 mg (30 mg Subcutaneous Given 09/08/23 0152)  ceFEPIme (MAXIPIME) 2 g in sodium chloride 0.9 % 100 mL IVPB (0 g Intravenous Stopped 09/08/23 0102)  vancomycin (VANCOREADY) IVPB 1250 mg/250 mL (0 mg Intravenous Stopped 09/08/23 0505)    Mobility walks with person assist/pt can stand with assistance but needs a wheelchair to go far distances due to the pain in her right foot     Focused Assessments Cardiac Assessment Handoff:    Lab Results  Component Value Date   CKTOTAL 264 (H) 08/01/2011   CKMB 4.1 (H) 08/01/2011   No results found  for: "DDIMER" Does the Patient currently have chest pain? No  R Recommendations: See Admitting Provider Note  Report given to:   Additional Notes:

## 2023-09-08 NOTE — Assessment & Plan Note (Addendum)
Patient with chronic pain disorder, appears to be related to chronic claudication symptoms. Home regimen includes Tylenol 1g TID as well as Norco 10-325 q6h. Patient appears comfortable, hemodynamically stable, however, states she is having persistent pain over her wounds and chronic leg pain.  - Continue Tylenol 1000 mg TID  - Continue Norco 10-325 q6h PRN

## 2023-09-08 NOTE — Assessment & Plan Note (Addendum)
Patient with LLE cellulitis of L ankle and shin with no induration, swelling, or drainage. History of venous insufficiency and significant stasis dermatitis. Followed by Orthopedic Surgery outpatient as well. Afebrile, with no leukocytosis or sick symptoms.  - Antibiotic treatment as above - Follow up blood cultures  - Orthopedic Surgery consulted - Vascular Surgery consulted

## 2023-09-08 NOTE — Assessment & Plan Note (Addendum)
Cr of 1.16 on admission with baseline around 0.7. Improved today to 0.78. Patient with adequate appetite and intake. Asymptomatic otherwise.  - BMP in AM to monitor renal function - Encourage PO intake

## 2023-09-08 NOTE — Progress Notes (Signed)
Pharmacy Antibiotic Note  Isabel Barnes is a 82 y.o. female admitted on 09/07/2023 presenting with foot infection.  Pharmacy has been consulted for vancomycin dosing.  Vancomycin 1250 mg IV x 1 given in ED  Plan: Vancomycin 750 mg IV q 24h (eAUC 529) Monitor renal function, Cx and clinical progression to narrow Vancomycin levels as indicated  Height: 5' (152.4 cm) Weight: 55.3 kg (122 lb) IBW/kg (Calculated) : 45.5  Temp (24hrs), Avg:98.2 F (36.8 C), Min:97.3 F (36.3 C), Max:98.8 F (37.1 C)  Recent Labs  Lab 09/07/23 1404 09/07/23 1422 09/07/23 1849 09/08/23 0425 09/08/23 0525  WBC 5.5  --   --  3.7*  --   CREATININE 1.16*  --   --   --  0.78  LATICACIDVEN  --  1.6 1.0  --   --     Estimated Creatinine Clearance: 40.8 mL/min (by C-G formula based on SCr of 0.78 mg/dL).    Allergies  Allergen Reactions   Dilaudid [Hydromorphone Hcl] Shortness Of Breath   Gabapentin Other (See Comments)    Hoarseness , headache and sore throat   Latex Rash    Severe rash   Lyrica [Pregabalin] Other (See Comments)    No balance , had to walk with cane , Blurred vision,weakness.   Oxycodone Shortness Of Breath and Other (See Comments)    Tolerated oxycodone during admission to hospital 02/06/22   Singulair [Montelukast] Shortness Of Breath and Other (See Comments)    Vision issues, also   Ciprofloxacin Hcl Nausea And Vomiting and Other (See Comments)    Nausea and vomiting with by mouth form   Codeine Other (See Comments)    Hallucinations   Methadone Nausea And Vomiting and Other (See Comments)    Severe nausea and vomiting   Metronidazole Nausea And Vomiting and Other (See Comments)    Gastric pain   Oysters [Shellfish Allergy] Other (See Comments)    "Terrible gastric upset and cramping."   Clindamycin/Lincomycin Diarrhea and Nausea Only   Donepezil Diarrhea and Other (See Comments)    Severe diarrhea   Sulfa Antibiotics Diarrhea and Other (See Comments)    GI issues     Tape Other (See Comments)    ADHESIVE TAPE-Severe rash   Zetia [Ezetimibe] Diarrhea   Elemental Sulfur Nausea And Vomiting   Iodine Rash   Other Rash    All Antibiotic ointments/ creams   Oyster Shell Rash   Penicillin G Nausea And Vomiting and Rash   Penicillins Nausea And Vomiting and Rash    Has patient had a PCN reaction causing immediate rash, facial/tongue/throat swelling, SOB or lightheadedness with hypotension: Yes Has patient had a PCN reaction causing severe rash involving mucus membranes or skin necrosis: No Has patient had a PCN reaction that required hospitalization No Has patient had a PCN reaction occurring within the last 10 years: No If all of the above answers are "NO", then may proceed with Cephalosporin use.    Povidone Iodine Rash and Other (See Comments)    Oyster shell products- Rash    Skintegrity Hydrogel [Skin Protectants, Misc.] Rash   Tapentadol Other (See Comments)    Nightmares **Nucynta**    Daylene Posey, PharmD, [facility] Clinical Pharmacist ED Pharmacist Phone # [phone] 09/08/2023 9:39 AM

## 2023-09-08 NOTE — Assessment & Plan Note (Addendum)
Admitted for IV antibiotics at recommendation of orthopedist after no improvement with 10-day course of oral antibiotics.  [facility] has agreed to see the patient tomorrow. - Admit to [facility], MedSurg, attending Dr. Lum Babe -Orthopedic surgery following, Dr. Lajoyce Corners to see on 10/10 - Vital signs per floor - PT/OT to treat - f/u blood cultures - Continue antibiotics: Vancomycin and cefepime - AM CBC -Continue home Tylenol and Norco for pain -MiraLAX ordered for chronic narcotic use -Consult to wound care

## 2023-09-08 NOTE — Progress Notes (Addendum)
Daily Progress Note Intern Pager: [phone]  Patient name: Isabel Barnes Medical record number: [id number] Date of birth: 11/09/1941 Age: 82 y.o. Gender: female  Primary Care Provider: Jamal Collin, PA-C Consultants: Orthopedic Surgery Code Status: DNR  Pt Overview and Major Events to Date:  10/09: Admitted to FMTS  Assessment and Plan:  LYFE DEPPE is a 82 yo female with past medical history of peripheral arterial disease, chronic venous insufficiency, CVA (remote R cerebellar infarct incidentally noted in 01/2022), HTN, hypothyroidism, and depression who presents here with chronic R heel ulcer, R leg wound, and LLE cellulitis.  Assessment & Plan RLE Wound  R Heel Ulcer Patient with R shin wound, has been followed by Orthopedics for this for several months, and R heel ulcer which is ongoing x 2 months. Failed outpatient course of Abx (10 days of Cefadroxil), here for IV Abx (Vanc & Cefepime) at recommendation of orthopedist. History of extensive venous insufficiency and PAD with chronic RLE wound, prior cultures during hospitalization for similar in 2023 with E. Faecalis and Bacteroides. Patient afebrile with no leukocytosis or systemic symptoms on admission. Appears comfortable, but with pain of RLE with pressure. Hemodynamically stable. Extremities warm. XR with no overt evidence of osteomyelitis but MRI recommended for further assessment.  - MRI R tibia/fibula - MRI R heel - Continue IV Vancomycin and IV Cefepime - START IV Flagyl for additional coverage given prior wound cultures - Follow up blood cultures  - WOC consulted - Orthopedic Surgery consulted - Vascular Surgery consulted LLE Cellulitis Patient with LLE cellulitis of L ankle and shin with no induration, swelling, or drainage. History of venous insufficiency and significant stasis dermatitis. Followed by Orthopedic Surgery outpatient as well. Afebrile, with no leukocytosis or sick symptoms.  - Antibiotic  treatment as above - Follow up blood cultures  - Orthopedic Surgery consulted - Vascular Surgery consulted  Peripheral arterial disease [facility]) Patient with history of extensive peripheral arterial disease with chronic claudication symptoms, no worse here. Per review, last aortogram completed in 2023 secondary to RLE wound, with R anterior tibial and posterior tibial arteries occluded. No vascular intervention completed at that time due to collateral inline flow. Patient put on ASA and Crestor for PAD, has not been taking Crestor, intermittently takes ASA. Will consult Vascular Surgery given worsening RLE wound.  - Continue ASA 81 mg daily - Vascular Surgery consulted  Anemia Hgb 10.6 on admission, Hgb this morning downtrending to 8.4. Patient with no signs/symptoms consistent with active bleed. Per review, history of anemia during prior hospitalization during which endoscopy was recommended, but patient declined. Prior iron studies 01/2023 consistent with IDA. Suspect component of chronic IDA as well as dilutional anemia given CBC this AM with all cell lines decreased and patient received fluids in ED with Abx.  - Iron and TIBC - Ferritin  - CBC in AM to monitor Hgb Atrial fibrillation (HCC) History of paroxysmal atrial fibrillation, presented in 2023 during hospitalization in Afib with spontaneous conversion. Placed on 200 mg BID. Patient reports this medication was discontinued, she is unsure when/for what reason, has not been taking this in several months. No chest pain, palpitations, shortness of breath. Cardiopulmonary exam with regular rate and rhythm. Hemodynamically stable.  - EKG - Continue to monitor vitals and symptoms  Aortic valve stenosis Patient with systolic murmur noted on cardiopulmonary exam. Reports this has been present for years. Echo in 2023 with normal LVEF, and significant aortic stenosis with aortic valve calcification. Patient with  no chest pain, shortness of breath.  Hemodynamically stable.  - Continue to monitor vitals and symptoms Chronic pain disorder Patient with chronic pain disorder, appears to be related to chronic claudication symptoms. Home regimen includes Tylenol 1g TID as well as Norco 10-325 q6h. Patient appears comfortable, hemodynamically stable, however, states she is having persistent pain over her wounds and chronic leg pain.  - Continue Tylenol 1000 mg TID  - Continue Norco 10-325 q6h PRN AKI (acute kidney injury) (HCC) Cr of 1.16 on admission with baseline around 0.7. Improved today to 0.78. Patient with adequate appetite and intake. Asymptomatic otherwise.  - BMP in AM to monitor renal function - Encourage PO intake   Chronic and Stable Conditions: HTN: Holding home Losartan due to soft Bps Hypothyroidism: Continue home Synthroid 50 mcg GERD: Continue Pantoprazole 40 mg BID Insomnia: Continue Melatonin 3 mg nightly   FEN/GI: Regular diet VTE Prophylaxis: Lovenox Dispo: Home pending clinical improvement  Subjective:  Patient reports she is feeling similar to admission. Is having pain at the R heel ulcer site with any pressure application. Walks at baseline with a walker, but has been unable to ambulate as easily lately. Does report claudication at baseline. No HA, CP, palpitations, shortness of breath, nausea, vomiting, abdominal pain, diarrhea.   Objective: Temp:  [97.3 F (36.3 C)-98.8 F (37.1 C)] 98.6 F (37 C) (10/10 0700) Pulse Rate:  [60-92] 71 (10/10 0700) Resp:  [15-18] 17 (10/10 0647) BP: (93-144)/(47-63) 109/54 (10/10 0700) SpO2:  [94 %-100 %] 96 % (10/10 0700) Weight:  [55.3 kg] 55.3 kg (10/09 1354) Physical Exam: General: Well-appearing, lying in bed in no acute distress.  Cardiovascular: 4/6 holosystolic murmur noted on exam, rate regular.  Respiratory: Lungs clear to auscultation with no focal wheezing, rales, or rhonchi noted.  Abdomen: Soft, nontender, nondistended, with no rebound or guarding.   Extremities: LLE with cellulitis over the L ankle and L shin, no weeping or drainage noted, no swelling or induration. RLE with small ulcer over the R heel with no drainage noted. Large wound over the R shin medial aspect with no drainage. 2+ pedal pulses bilaterally, extremities warm. Lymphedema noted in BLE.   Laboratory: Most recent CBC Lab Results  Component Value Date   WBC 3.7 (L) 09/08/2023   HGB 8.4 (L) 09/08/2023   HCT 26.0 (L) 09/08/2023   MCV 90.3 09/08/2023   PLT 260 09/08/2023   Most recent BMP    Latest Ref Rng & Units 09/08/2023    5:25 AM  BMP  Glucose 70 - 99 mg/dL 67   BUN 8 - 23 mg/dL 14   Creatinine 4.09 - 1.00 mg/dL 8.11   Sodium 914 - 782 mmol/L 142   Potassium 3.5 - 5.1 mmol/L 4.6   Chloride 98 - 111 mmol/L 113   CO2 22 - 32 mmol/L 21   Calcium 8.9 - 10.3 mg/dL 7.5     Other pertinent labs: Lactic Acid: 1.6 > 1.0 PT/INR: 13.2/1.0 Blood cultures: pending    Imaging/Diagnostic Tests: XR Ankle:  IMPRESSION: 1. Wound about the medial aspect of the lower leg. Cortical thickening in the mid aspect of the tibia at the level of wound may be sequela of prior infection. Consider MRI for further assessment of acute infection. 2. Plantar soft tissue defect typical of ulcer. Generalized soft tissue edema about the ankle and lower leg.  XR Tib/Fib:  IMPRESSION: 1. Wound about the medial aspect of the lower leg. Cortical thickening in the mid aspect of the  tibia at the level of wound may be sequela of prior infection. Consider MRI for further assessment of acute infection. 2. Plantar soft tissue defect typical of ulcer. Generalized soft tissue edema about the ankle and lower leg.  [place], Medical Student 09/08/2023, 10:33 AM  I was personally present and performed or re-performed the history, physical exam and medical decision making activities of this service and have verified that the service and findings are accurately documented in the  student's note.  Janeal Holmes, MD                  09/08/2023, 2:18 PM\ PGY-2, [facility] FPTS Intern pager: [phone], text pages welcome Secure chat group [facility] Teaching Service

## 2023-09-08 NOTE — Consult Note (Addendum)
[facility] NOTE   MRN : [id number]  Reason for Consult: Right Lower extremity wounds medial calf and heel  Referring Physician: ED  History of Present Illness: 82 y/o female with history of chronic wounds.  past medical history of CVA, HTN, Hypothyroidism, Aortic stenosis, Iron deficiency Anemia, venous insufficiency, and PAD who presented to hospital with necrotic abscess of her chronic right medial calf and  73 week old heel wound with chronic cellulitis.      She underwent angiogram on 02/16/23 with Dr. Randie Heinz Findings: Aorta and iliac segments are free of flow-limiting stenosis.  Left lower extremity she appears to have inline flow via at least the anterior tibial artery all the way to the level of the ankle.  On the right side the SFA is large popliteal is large both are patent with no flow-limiting stenosis.  The anterior tibial and posterior tibial arteries are occluded the peroneal artery is at least 3 and half millimeters and it provides inline flow to the ankle and at that level gives off a plantar branch to the foot. She was scheduled to follow up 09/09/23 with our office.  On 03/12/22 Dr. Lajoyce Corners performed RIGHT LEG IRRIGATION AND DEBRIDEMENT EXTREMITY, SKIN GRAFT FULL THICKNESS, APPLICATION OF WOUND VAC.  She has been followed by Dr. 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 Patient has been using compression wraps and Silvadene. Patient states she recently had an ulcer on the right heel was treated with cefadroxil but still has sensitivity to touch.      Current Facility-Administered Medications  Medication Dose Route Frequency Provider Last Rate Last Admin   acetaminophen (TYLENOL) tablet 1,000 mg  1,000 mg Oral TID Cyndia Skeeters, DO   1,000 mg at 09/08/23 1007   aspirin EC tablet 81 mg  81 mg Oral Daily Cyndia Skeeters, DO   81 mg at 09/08/23 1008   ceFEPIme (MAXIPIME) 2 g in  sodium chloride 0.9 % 100 mL IVPB  2 g Intravenous Q12H Dameron, Nolberto Hanlon, DO   Stopped at 09/08/23 1157   cycloSPORINE (RESTASIS) 0.05 % ophthalmic emulsion 1 drop  1 drop Both Eyes Daily Cyndia Skeeters, DO   1 drop at 09/08/23 1009   enoxaparin (LOVENOX) injection 40 mg  40 mg Subcutaneous QHS [place], Lynita Lombard, RPH       fluticasone (FLONASE) 50 MCG/ACT nasal spray 1-2 spray  1-2 spray Each Nare BID PRN Cyndia Skeeters, DO       HYDROcodone-acetaminophen (NORCO) 10-325 MG per tablet 1 tablet  1 tablet Oral Q6H PRN Cyndia Skeeters, DO   1 tablet at 09/08/23 1123   levothyroxine (SYNTHROID) tablet 50 mcg  50 mcg Oral q morning Cyndia Skeeters, DO   50 mcg at 09/08/23 0536   loratadine (CLARITIN) tablet 10 mg  10 mg Oral Daily Cyndia Skeeters, DO   10 mg at 09/08/23 1008   melatonin tablet 3 mg  3 mg Oral QPM Cyndia Skeeters, DO   3 mg at 09/08/23 0151   metroNIDAZOLE (FLAGYL) IVPB 500 mg  500 mg Intravenous Q12H Darral Dash, DO   Stopped at 09/08/23 1124   multivitamin with minerals tablet   Oral Daily Cyndia Skeeters, DO   1 tablet at 09/08/23 1008   pantoprazole (PROTONIX) EC tablet 40 mg  40 mg Oral BID Cyndia Skeeters, DO   40 mg at 09/08/23 1008   polyethylene glycol (MIRALAX / GLYCOLAX) packet 17  g  17 g Oral Daily Cyndia Skeeters, DO   17 g at 09/08/23 1013   silver sulfADIAZINE (SILVADENE) 1 % cream   Topical Daily Janit Pagan T, MD   Given at 09/08/23 1009   [START ON 09/09/2023] vancomycin (VANCOREADY) IVPB 750 mg/150 mL  750 mg Intravenous Q24H Daylene Posey, [facility]       Current Outpatient Medications  Medication Sig Dispense Refill   acetaminophen (TYLENOL) 500 MG tablet Take 2 tablets (1,000 mg total) by mouth 3 (three) times daily. (Patient taking differently: Take 1,000 mg by mouth daily as needed for moderate pain.) 180 tablet 0   cholecalciferol (VITAMIN D) 1000 UNITS tablet Take 1,000 Units by mouth daily.     diclofenac sodium (VOLTAREN) 1 % GEL Apply 2 g topically daily as needed (for  pain).     Digestive Enzymes (ENZYME DIGEST) CAPS Take 1 capsule by mouth daily as needed (For digestive). 30 capsule 0   ENSURE PLUS (ENSURE PLUS) LIQD Take 237 mLs by mouth daily.     fish oil-omega-3 fatty acids 1000 MG capsule Take 1,000 mg by mouth 2 (two) times daily.      HYDROcodone-acetaminophen (NORCO) 10-325 MG tablet Take 1 tablet by mouth in the morning, at noon, and at bedtime.     hydrOXYzine (ATARAX) 25 MG tablet Take 25 mg by mouth every 8 (eight) hours as needed for anxiety.     Lactobacillus (PROBIOTIC ACIDOPHILUS PO) Take 1 capsule by mouth daily.     levothyroxine (SYNTHROID) 50 MCG tablet Take 1 tablet (50 mcg total) by mouth every morning. 30 tablet 0   loperamide (IMODIUM A-D) 2 MG tablet Take 4 mg by mouth 3 (three) times daily as needed for diarrhea or loose stools.     losartan (COZAAR) 25 MG tablet Take 1 tablet (25 mg total) by mouth daily. (Patient taking differently: Take 100 mg by mouth daily.) 30 tablet 0   methocarbamol (ROBAXIN) 500 MG tablet Take 500 mg by mouth every 8 (eight) hours as needed for muscle spasms.     Multiple Vitamins-Minerals (MULTIVITAMIN PO) Take 1 tablet by mouth daily.     omeprazole (PRILOSEC) 20 MG capsule Take 20 mg by mouth daily as needed (for acid reflux).     ondansetron (ZOFRAN) 8 MG tablet Take 1 tablet (8 mg total) by mouth every 8 (eight) hours as needed for nausea or vomiting. 20 tablet 0   prochlorperazine (COMPAZINE) 5 MG tablet Take 5 mg by mouth daily.     RESTASIS 0.05 % ophthalmic emulsion Place 1 drop into both eyes daily. 0.4 mL 0   sertraline (ZOLOFT) 50 MG tablet Take 50 mg by mouth daily as needed (depression, sleep).     silver sulfADIAZINE (SILVADENE) 1 % cream Apply 1 Application topically 2 (two) times daily.     tetrahydrozoline 0.05 % ophthalmic solution Place 1 drop into both eyes 4 (four) times daily.     triamcinolone (NASACORT) 55 MCG/ACT AERO nasal inhaler Place 1-2 sprays into the nose 2 (two) times daily  as needed (for seasonal allergies). 1 each 3   Turmeric 500 MG CAPS Take 1 capsule by mouth at bedtime.     Vitamin D-Vitamin K (K2 PLUS D3) [phone] MCG-UNIT TABS Take 1 tablet by mouth daily.     carboxymethylcellulose (REFRESH PLUS) 0.5 % SOLN Place 1 drop into both eyes 3 (three) times daily as needed (Dry eyes). (Patient not taking: Reported on 09/07/2023)     ferrous sulfate  325 (65 FE) MG tablet Take 1 tablet (325 mg total) by mouth daily with breakfast. (Patient not taking: Reported on 09/07/2023) 30 tablet 0   melatonin 5 MG TABS Take 5 mg by mouth every evening. (Patient not taking: Reported on 09/07/2023)     polyethylene glycol (MIRALAX / GLYCOLAX) 17 g packet Take 17 g by mouth daily. (Patient not taking: Reported on 09/07/2023) 14 each 2    Pt meds include: Statin :No Betablocker: No ASA: YES Other anticoagulants/antiplatelets: None  Past Medical History:  Diagnosis Date   Anemia    iron deficiency hx.has had iron infusions before    Chronic low back pain    Chronic pain disorder    Complication of anesthesia    severe claustrophobia   Constipation    r/t use of pain meds.Takes OTC meds or eats prunes   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 Albuterol inhaler as needed   Spinal headache 1991   blood patch placed   Spondylitis  (HCC)    Unsteady gait    occasionally   Urinary urgency     Past Surgical History:  Procedure Laterality Date   ABDOMINAL AORTOGRAM W/LOWER EXTREMITY N/A 02/15/2022   Procedure: ABDOMINAL AORTOGRAM W/LOWER EXTREMITY;  Surgeon: Maeola Harman, MD;  Location: [facility] INVASIVE CV LAB;  Service: Cardiovascular;  Laterality: N/A;   ABDOMINAL HYSTERECTOMY     ANTERIOR FUSION CERVICAL SPINE     x2 -C4-7   APPLICATION OF WOUND VAC Right 03/12/2022   Procedure: APPLICATION OF WOUND VAC;  Surgeon: Nadara Mustard, MD;  Location: [facility] OR;  Service: Orthopedics;  Laterality: Right;   BUNIONECTOMY Bilateral    COLONOSCOPY     CYSTOSCOPY W/ URETEROSCOPY  2012   EYE SURGERY Bilateral    cataract /lens implant   HOLMIUM LASER APPLICATION Left 02/08/2013   Procedure: HOLMIUM LASER APPLICATION;  Surgeon: Anner Crete, MD;  Location: [facility];  Service: Urology;  Laterality: Left;   I & D EXTREMITY Right 02/10/2022   Procedure: DEBRIDEMENT RIGHT LEG ABSCESS;  Surgeon: Nadara Mustard, MD;  Location: [facility] OR;  Service: Orthopedics;  Laterality: Right;   I & D EXTREMITY Right 03/12/2022   Procedure: RIGHT LEG IRRIGATION AND DEBRIDEMENT EXTREMITY;  Surgeon: Nadara Mustard, MD;  Location: [facility] OR;  Service: Orthopedics;  Laterality: Right;   INSERTION OF MESH N/A 07/15/2014   Procedure: INSERTION OF MESH;  Surgeon: Ardeth Sportsman, MD;  Location: [facility] OR;  Service: General;  Laterality: N/A;   JOINT REPLACEMENT Right 2012   shoulder   LAPAROSCOPIC CHOLECYSTECTOMY W/ CHOLANGIOGRAPHY  2012   Dr Magnus Ivan  NASAL SINUS SURGERY     OPEN REDUCTION INTERNAL FIXATION (ORIF) DISTAL RADIAL FRACTURE Left 03/20/2021   Procedure: OPEN REDUCTION INTERNAL FIXATION (ORIF) DISTAL RADIAL FRACTURE;  Surgeon: Teryl Lucy, MD;  Location: [facility] OR;  Service: Orthopedics;  Laterality: Left;   RADIOLOGY WITH ANESTHESIA N/A 05/09/2014   Procedure: ADULT SEDATION WITH ANESTHESIA/MRI CERVICAL SPINE WITHOUT CONTRAST;   Surgeon: Medication Radiologist, MD;  Location: [facility] OR;  Service: Radiology;  Laterality: N/A;  DR. 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   Procedure: RIGHT TOTAL KNEE ARTHROPLASTY;  Surgeon: Jodi Geralds, MD;  Location: [facility] OR;  Service: Orthopedics;  Laterality: Right;   UMBILICAL HERNIA REPAIR N/A 07/15/2014   Procedure: LAPAROSCOPIC UMBILICAL AND INFRAUMBILICAL HERNIA;  Surgeon: Ardeth Sportsman, MD;  Location: [facility] OR;  Service: General;  Laterality: N/A;    Social History Social History   Tobacco Use   Smoking status: Former    Current packs/day: 0.00    Average packs/day: 0.5 packs/day for 2.0 years (1.0 ttl pk-yrs)    Types: Cigarettes    Start date: 02/06/1990    Quit date: 02/07/1992    Years since quitting: 31.6   Smokeless tobacco: Never  Vaping Use   Vaping status: Never Used  Substance Use Topics   Alcohol use: No   Drug use: No    Family History Family History  Problem Relation Age of Onset   Stroke Father     Allergies  Allergen Reactions   Dilaudid [Hydromorphone Hcl] Shortness Of Breath   Gabapentin Other (See Comments)    Hoarseness , headache and sore throat   Latex Rash    Severe rash   Lyrica [Pregabalin] Other (See Comments)    No balance , had to walk with cane , Blurred vision,weakness.   Oxycodone Shortness Of Breath and Other (See Comments)    Tolerated oxycodone during admission to hospital 02/06/22   Singulair [Montelukast] Shortness Of Breath  and Other (See Comments)    Vision issues, also   Ciprofloxacin Hcl Nausea And Vomiting and Other (See Comments)    Nausea and vomiting with by mouth form   Codeine Other (See Comments)    Hallucinations   Methadone Nausea And Vomiting and Other (See Comments)    Severe nausea and vomiting   Metronidazole Nausea And Vomiting and Other (See Comments)    Gastric pain   Oysters [Shellfish Allergy] Other (See Comments)    "Terrible gastric upset and cramping."   Clindamycin/Lincomycin Diarrhea and Nausea Only   Donepezil Diarrhea and Other (See Comments)    Severe diarrhea   Sulfa Antibiotics Diarrhea and Other (See Comments)    GI issues    Tape Other (See Comments)    ADHESIVE TAPE-Severe rash   Zetia [Ezetimibe] Diarrhea   Elemental Sulfur Nausea And Vomiting   Iodine Rash   Other Rash    All Antibiotic ointments/ creams   Oyster Shell Rash   Penicillin G Nausea  And Vomiting and Rash   Penicillins Nausea And Vomiting and Rash    Has patient had a PCN reaction causing immediate rash, facial/tongue/throat swelling, SOB or lightheadedness with hypotension: Yes Has patient had a PCN reaction causing severe rash involving mucus membranes or skin necrosis: No Has patient had a PCN reaction that required hospitalization No Has patient had a PCN reaction occurring within the last 10 years: No If all of the above answers are "NO", then may proceed with Cephalosporin use.    Povidone Iodine Rash and Other (See Comments)    Oyster shell products- Rash    Skintegrity Hydrogel [Skin Protectants, Misc.] Rash   Tapentadol Other (See Comments)    Nightmares **Nucynta**     REVIEW OF SYSTEMS  General: [ ]  Weight loss, [ ]  Fever, [ ]  chills Neurologic: [ ]  Dizziness, [ ]  Blackouts, [ ]  Seizure [ ]  Stroke, [ ]  "Mini stroke", [ ]  Slurred speech, [ ]  Temporary blindness; [ ]  weakness in arms or legs, [ ]  Hoarseness [ ]  Dysphagia Cardiac: [ ]  Chest pain/pressure, [ ]  Shortness of breath at  rest [ ]  Shortness of breath with exertion, [ ]  Atrial fibrillation or irregular heartbeat  Vascular: [ ]  Pain in legs with walking, [ ]  Pain in legs at rest, [ ]  Pain in legs at night,  [x ] Non-healing ulcer, [ ]  Blood clot in vein/DVT,   Pulmonary: [ ]  Home oxygen, [ ]  Productive cough, [ ]  Coughing up blood, [ ]  Asthma,  [ ]  Wheezing [ ]  COPD Musculoskeletal:  [ ]  Arthritis, [ ]  Low back pain, [ ]  Joint pain Hematologic: [ ]  Easy Bruising, [ ]  Anemia; [ ]  Hepatitis Gastrointestinal: [ ]  Blood in stool, [ ]  Gastroesophageal Reflux/heartburn, Urinary: [ ]  chronic Kidney disease, [ ]  on HD - [ ]  MWF or [ ]  TTHS, [ ]  Burning with urination, [ ]  Difficulty urinating Skin: [ ]  Rashes, [ ]  Wounds Psychological: [ ]  Anxiety, [ ]  Depression  Physical Examination Vitals:   09/08/23 0647 09/08/23 0700 09/08/23 1045 09/08/23 1048  BP: (!) 95/50 (!) 109/54 (!) 151/77   Pulse: 71 71 80   Resp: 17  (!) 21   Temp:  98.6 F (37 C)  98.1 F (36.7 C)  TempSrc:    Oral  SpO2: 95% 96% 97%   Weight:      Height:       Body mass index is 23.83 kg/m.  General:  WDWN in NAD Gait: Normal HENT: WNL Eyes: Pupils equal Pulmonary: normal non-labored breathing , without Rales, rhonchi,  wheezing Cardiac: RRR, without  Murmurs, rubs or gallops; No carotid bruits Abdomen: soft, NT, no masses Skin: no rashes, ulcers noted;  no Gangrene , no cellulitis; no open wounds;   Vascular Exam/Pulses:weakly Femoral arteries palpable left DP pulse, unable to palpate right pedal pulse, dressing clean and dry   Musculoskeletal: significant B LE edema  Neurologic: A&O X 3; Appropriate Affect ;  SENSATION: normal; MOTOR FUNCTION: 5/5 Symmetric Speech is fluent/normal   Significant Diagnostic Studies: CBC Lab Results  Component Value Date   WBC 3.7 (L) 09/08/2023   HGB 8.4 (L) 09/08/2023   HCT 26.0 (L) 09/08/2023   MCV 90.3 09/08/2023   PLT 260 09/08/2023    BMET    Component Value Date/Time   NA  142 09/08/2023 0525   NA 133 (A) 03/16/2022 0000   NA 142 02/17/2016 0920   K 4.6 09/08/2023 0525   K 4.2  02/17/2016 0920   CL 113 (H) 09/08/2023 0525   CO2 21 (L) 09/08/2023 0525   CO2 26 02/17/2016 0920   GLUCOSE 67 (L) 09/08/2023 0525   GLUCOSE 93 02/17/2016 0920   BUN 14 09/08/2023 0525   BUN 9 03/16/2022 0000   BUN 13.3 02/17/2016 0920   CREATININE 0.78 09/08/2023 0525   CREATININE 0.7 02/17/2016 0920   CALCIUM 7.5 (L) 09/08/2023 0525   CALCIUM 8.8 02/17/2016 0920   GFRNONAA >60 09/08/2023 0525   GFRAA >60 06/25/2017 1506   Estimated Creatinine Clearance: 40.8 mL/min (by C-G formula based on SCr of 0.78 mg/dL).  COAG Lab Results  Component Value Date   INR 1.0 09/07/2023   INR 1.3 (H) 02/09/2022   INR 1.3 (H) 02/07/2022     Non-Invasive Vascular Imaging: ABI's have been ordered.  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  She has a new right plantar heel wound for 3 weeks and is followed by [facility] and they suggested she come to [facility] for further workup.   02/15/22 Angiogram  demonstrated left LE with inline flow AT, and right LE AT/PT occlusion and single vessel runoff peroneal.  Previous debridement with skin graft by Dr. Lajoyce Corners 03/12/22 the wound is slowly healing compared to a picture from 02/08/23 to today's picture.    WOC consult: Continue plan of care as previously ordered by the ortho team to assist with removal of nonviable tissue: Apply Silvadene to right plantar heel and right leg wound Q day, then cover with ABD pad and kerlex and ace wrap, begin just behind the toes to below the knee is a spiral fashion.   Pending ABI with future plans for angiogram likely Monday.  Mosetta Pigeon 09/08/2023 1:36 PM  VASCULAR STAFF ADDENDUM: I have independently interviewed and examined the patient. I agree with the above.  Check ABI. Wounds have progressed - will plan to repeat angiogram Monday.  Rande Brunt.  Lenell Antu, MD [facility] Phone Number: [phone] 09/08/2023 5:23 PM

## 2023-09-08 NOTE — Progress Notes (Signed)
PT Cancellation Note  Patient Details Name: Isabel Barnes MRN: [id number] DOB: [birth year]   Cancelled Treatment:    Reason Eval/Treat Not Completed: Patient not medically ready - await ortho and vascular consults.  Marye Round, PT DPT Acute Rehabilitation Services Secure Chat Preferred  Office [phone]    Truddie Coco 09/08/2023, 1:45 PM

## 2023-09-08 NOTE — Assessment & Plan Note (Addendum)
Patient with systolic murmur noted on cardiopulmonary exam. Reports this has been present for years. Echo in 2023 with normal LVEF, and significant aortic stenosis with aortic valve calcification. Patient with no chest pain, shortness of breath. Hemodynamically stable.  - Continue to monitor vitals and symptoms

## 2023-09-08 NOTE — Assessment & Plan Note (Signed)
Creatinine 1.16 on admission; baseline appears to be around 0.7. - Encourage fluids -- AM BMP - Will hold off on IV fluids at this time due to patient with good p.o., however low threshold to begin fluids if no improvement

## 2023-09-08 NOTE — Consult Note (Addendum)
WOC Nurse Consult Note: Reason for Consult: Consult requested for right foot and right leg.  Performed remotely after review of progress notes and photos in the EMR.  Pt is followed by the ortho service prior to admission for chronic wounds to right anterior calf and right plantar heel.  Areas are yellow and moist.  MRI pending to R/O osteomyelitis and vascular consult is pending, according to primary team.  Continue plan of care as previously ordered by the ortho team to assist with removal of nonviable tissue: Apply Silvadene to right plantar heel and right leg wound Q day, then cover with ABD pad and kerlex and ace wrap, begin just behind the toes to below the knee is a spiral fashion. Float heel to reduce pressure.  Please refer to ortho and vascular service for further plan of care.  Please re-consult if further assistance is needed.  Thank-you,  Cammie Mcgee MSN, RN, CWOCN, [place], CNS [phone]

## 2023-09-08 NOTE — Assessment & Plan Note (Addendum)
Patient with R shin wound, has been followed by Orthopedics for this for several months, and R heel ulcer which is ongoing x 2 months. Failed outpatient course of Abx (10 days of Cefadroxil), here for IV Abx (Vanc & Cefepime) at recommendation of orthopedist. History of extensive venous insufficiency and PAD with chronic RLE wound, prior cultures during hospitalization for similar in 2023 with E. Faecalis and Bacteroides. Patient afebrile with no leukocytosis or systemic symptoms on admission. Appears comfortable, but with pain of RLE with pressure. Hemodynamically stable. Extremities warm. XR with no overt evidence of osteomyelitis but MRI recommended for further assessment.  - MRI R tibia/fibula - MRI R heel - Continue IV Vancomycin and IV Cefepime - START IV Flagyl for additional coverage given prior wound cultures - Follow up blood cultures  - WOC consulted - Orthopedic Surgery consulted - Vascular Surgery consulted

## 2023-09-08 NOTE — Assessment & Plan Note (Addendum)
History of paroxysmal atrial fibrillation, presented in 2023 during hospitalization in Afib with spontaneous conversion. Placed on 200 mg BID. Patient reports this medication was discontinued, she is unsure when/for what reason, has not been taking this in several months. No chest pain, palpitations, shortness of breath. Cardiopulmonary exam with regular rate and rhythm. Hemodynamically stable.  - EKG - Continue to monitor vitals and symptoms

## 2023-09-08 NOTE — ED Notes (Signed)
Patient transported to MRI 

## 2023-09-08 NOTE — Assessment & Plan Note (Addendum)
Patient with history of extensive peripheral arterial disease with chronic claudication symptoms, no worse here. Per review, last aortogram completed in 2023 secondary to RLE wound, with R anterior tibial and posterior tibial arteries occluded. No vascular intervention completed at that time due to collateral inline flow. Patient put on ASA and Crestor for PAD, has not been taking Crestor, intermittently takes ASA. Will consult Vascular Surgery given worsening RLE wound.  - Continue ASA 81 mg daily - Vascular Surgery consulted

## 2023-09-08 NOTE — Assessment & Plan Note (Signed)
Patient with history of venous insufficiency and significant stasis dermatitis; she has had cellulitis in the past.  -Antibiotic treatment as above

## 2023-09-08 NOTE — Assessment & Plan Note (Addendum)
Hgb 10.6 on admission, Hgb this morning downtrending to 8.4. Patient with no signs/symptoms consistent with active bleed. Per review, history of anemia during prior hospitalization during which endoscopy was recommended, but patient declined. Prior iron studies 01/2023 consistent with IDA. Suspect component of chronic IDA as well as dilutional anemia given CBC this AM with all cell lines decreased and patient received fluids in ED with Abx.  - Iron and TIBC - Ferritin  - CBC in AM to monitor Hgb

## 2023-09-08 NOTE — ED Notes (Signed)
PT was assisted to a standing position and transferred to a wheelchair to go to the bathroom and urinate

## 2023-09-08 NOTE — Progress Notes (Signed)
OT Cancellation Note  Patient Details Name: Isabel Barnes MRN: [id number] DOB: 1941-06-23   Cancelled Treatment:    Reason Eval/Treat Not Completed: Other (comment).  Patient being trasnfered from ED to main floor.  OT will try again as schedule allows.  Dollene Mallery D Shiza Thelen 09/08/2023, 4:00 PM 09/08/2023  RP, OTR/L  Acute Rehabilitation Services  Office:  [phone]

## 2023-09-08 NOTE — Consult Note (Signed)
Reason for Consult: Right greater than venous stasis dermatitis, right lower extremity wound Referring Physician: [facility]  Isabel Barnes is an 82 y.o. female.  HPI: Isabel Barnes has been followed in our office for the last 3 or 4 weeks complaining of worsening swelling and erythema in the bilateral lower extremities along with a new wound about the plantar aspect of the right heel. We had treated her with 2 courses of cefadroxil orally.  She has notable allergies to oral antibiotics due to GI upset that is significant.  We had tried wound care techniques and consistent compression wraps along with offloading. She was not amendable to boot immobilization as she has difficulty getting around in the boot and currently lives alone.  Her daughter lives in [place].  Earlier this week if she presented to the office with worsening pain, erythema, and swelling in the right lower extremity as well as worsening drainage and pain in her right plantar heel wound despite our treatment efforts. There was difficulty palpating a reliable right pedal pulse. Due to her worsening symptoms and nature of her condition she was recommended to present to the emergency department for further evaluation, workup, and consideration of IV antibiotics.  Presently, patient is admitted under the hospitalist team.  She is receiving IV antibiotics.  She notes improvement in her swelling and erythema in the bilateral lower extremities with IV antibiotic treatment so far.  She persists with considerable pain with weightbearing in the right heel due to her ulcer.  An MRI scan of the right tib/fib as well as the right hindfoot are pending.  We were consulted to evaluate while inpatient.  She has a notable history of lower extremity venous stasis and PAD.  She has had significant cellulitis and wounds in the past. She presently reports feeling much better since admission.  She has seen vascular surgery today and and has been  followed by Dr. Lajoyce Corners for her wounds as well.  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HCC)    Unsteady gait    occasionally   Urinary urgency     Past Surgical History:  Procedure Laterality Date   ABDOMINAL AORTOGRAM W/LOWER EXTREMITY  N/A 02/15/2022   Procedure: ABDOMINAL AORTOGRAM W/LOWER EXTREMITY;  Surgeon: Maeola Harman, MD;  Location: [facility] INVASIVE CV LAB;  Service: Cardiovascular;  Laterality: N/A;   ABDOMINAL HYSTERECTOMY     ANTERIOR FUSION CERVICAL SPINE     x2 -C4-7   APPLICATION OF WOUND VAC Right 03/12/2022   Procedure: APPLICATION OF WOUND VAC;  Surgeon: Nadara Mustard, MD;  Location: [facility] OR;  Service: Orthopedics;  Laterality: Right;   BUNIONECTOMY Bilateral    COLONOSCOPY     CYSTOSCOPY W/ URETEROSCOPY  2012   EYE SURGERY Bilateral    cataract /lens implant   HOLMIUM LASER APPLICATION Left 02/08/2013   Procedure: HOLMIUM LASER APPLICATION;  Surgeon: Anner Crete, MD;  Location: [facility];  Service: Urology;  Laterality: Left;   I & D EXTREMITY Right 02/10/2022   Procedure: DEBRIDEMENT RIGHT LEG ABSCESS;  Surgeon: Nadara Mustard, MD;  Location: [facility] OR;  Service: Orthopedics;  Laterality: Right;   I & D EXTREMITY Right 03/12/2022   Procedure: RIGHT LEG IRRIGATION AND DEBRIDEMENT EXTREMITY;  Surgeon: Nadara Mustard, MD;  Location: [facility] OR;  Service: Orthopedics;  Laterality: Right;   INSERTION OF MESH N/A 07/15/2014   Procedure: INSERTION OF MESH;  Surgeon: Ardeth Sportsman, MD;  Location: [facility] OR;  Service: General;  Laterality: N/A;   JOINT REPLACEMENT Right 2012   shoulder   LAPAROSCOPIC CHOLECYSTECTOMY W/ CHOLANGIOGRAPHY  2012   Dr Magnus Ivan   NASAL SINUS SURGERY     OPEN REDUCTION INTERNAL FIXATION (ORIF) DISTAL RADIAL FRACTURE Left 03/20/2021   Procedure: OPEN REDUCTION INTERNAL FIXATION (ORIF) DISTAL RADIAL FRACTURE;  Surgeon: Teryl Lucy, MD;  Location: [facility] OR;  Service: Orthopedics;  Laterality: Left;   RADIOLOGY WITH ANESTHESIA N/A 05/09/2014   Procedure: ADULT SEDATION WITH ANESTHESIA/MRI CERVICAL SPINE WITHOUT CONTRAST;  Surgeon: Medication Radiologist, MD;  Location: [facility] OR;  Service: Radiology;  Laterality: N/A;  DR. HAWKS/MRI   right knee arthroscopy     d/t meniscal tear   SHOULDER ARTHROSCOPY W/ ROTATOR CUFF REPAIR Bilateral three times each over several yrs   SKIN FULL THICKNESS GRAFT Right 03/12/2022   Procedure: SKIN GRAFT FULL THICKNESS;  Surgeon: Nadara Mustard, MD;  Location: [facility] OR;  Service: Orthopedics;  Laterality: Right;   SKIN SPLIT GRAFT Right 02/17/2022   Procedure: RIGHT LEG SKIN GRAFT;  Surgeon: Nadara Mustard, MD;  Location: [facility] OR;  Service: Orthopedics;  Laterality: Right;   THUMB ARTHROSCOPY Left    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   Procedure: RIGHT TOTAL KNEE ARTHROPLASTY;  Surgeon: Jodi Geralds, MD;  Location: [facility] OR;  Service: Orthopedics;  Laterality: Right;   UMBILICAL HERNIA REPAIR N/A  07/15/2014   Procedure: LAPAROSCOPIC UMBILICAL AND INFRAUMBILICAL HERNIA;  Surgeon: Ardeth Sportsman, MD;  Location: [facility] OR;  Service: General;  Laterality: N/A;    Family History  Problem Relation Age of Onset   Stroke Father     Social History:  reports that she quit smoking about 31 years ago. Her smoking use included cigarettes. She started smoking about 33 years ago. She has a 1 pack-year smoking history. She has never used smokeless tobacco. She reports that she does not drink alcohol and does not use drugs.  Allergies:  Allergies  Allergen Reactions   Dilaudid [Hydromorphone Hcl] Shortness Of Breath   Gabapentin Other (See Comments)    Hoarseness , headache and sore throat   Latex Rash    Severe rash   Lyrica [Pregabalin] Other (See  Comments)    No balance , had to walk with cane , Blurred vision,weakness.   Oxycodone Shortness Of Breath and Other (See Comments)    Tolerated oxycodone during admission to hospital 02/06/22   Singulair [Montelukast] Shortness Of Breath and Other (See Comments)    Vision issues, also   Ciprofloxacin Hcl Nausea And Vomiting and Other (See Comments)    Nausea and vomiting with by mouth form   Codeine Other (See Comments)    Hallucinations   Methadone Nausea And Vomiting and Other (See Comments)    Severe nausea and vomiting   Metronidazole Nausea And Vomiting and Other (See Comments)    Gastric pain   Oysters [Shellfish Allergy] Other (See Comments)    "Terrible gastric upset and cramping."   Clindamycin/Lincomycin Diarrhea and Nausea Only   Donepezil Diarrhea and Other (See Comments)    Severe diarrhea   Sulfa Antibiotics Diarrhea and Other (See Comments)    GI issues    Tape Other (See Comments)    ADHESIVE TAPE-Severe rash   Zetia [Ezetimibe] Diarrhea   Elemental Sulfur Nausea And Vomiting   Iodine Rash   Other Rash    All Antibiotic ointments/ creams   Oyster Shell Rash   Penicillin G Nausea And Vomiting and Rash   Penicillins  Nausea And Vomiting and Rash    Has patient had a PCN reaction causing immediate rash, facial/tongue/throat swelling, SOB or lightheadedness with hypotension: Yes Has patient had a PCN reaction causing severe rash involving mucus membranes or skin necrosis: No Has patient had a PCN reaction that required hospitalization No Has patient had a PCN reaction occurring within the last 10 years: No If all of the above answers are "NO", then may proceed with Cephalosporin use.    Povidone Iodine Rash and Other (See Comments)    Oyster shell products- Rash    Skintegrity Hydrogel [Skin Protectants, Misc.] Rash   Tapentadol Other (See Comments)    Nightmares **Nucynta**    Medications: I have reviewed the patient's current medications.  Results for orders placed or performed during the hospital encounter of 09/07/23 (from the past 48 hour(s))  Culture, blood (Routine x 2)     Status: None (Preliminary result)   Collection Time: 09/07/23  1:56 PM   Specimen: BLOOD  Result Value Ref Range   Specimen Description BLOOD LEFT ANTECUBITAL    Special Requests      BOTTLES DRAWN AEROBIC AND ANAEROBIC Blood Culture adequate volume   Culture      NO GROWTH < 24 HOURS Performed at [facility], [address] [place], [place] [address]    Report Status PENDING   Culture, blood (Routine x 2)     Status: None (Preliminary result)   Collection Time: 09/07/23  2:01 PM   Specimen: BLOOD  Result Value Ref Range   Specimen Description BLOOD LEFT ANTECUBITAL    Special Requests      BOTTLES DRAWN AEROBIC ONLY Blood Culture adequate volume   Culture      NO GROWTH < 24 HOURS Performed at [facility], [address] [place], [place] [address]    Report Status PENDING   Comprehensive metabolic panel     Status: Abnormal   Collection Time: 09/07/23  2:04 PM  Result Value Ref Range   Sodium 142 135 - 145 mmol/L   Potassium 3.9 3.5 - 5.1 mmol/L   Chloride 106 98 - 111 mmol/L   CO2 26 22 -  32 mmol/L   Glucose, Bld 137 (H) 70 - 99 mg/dL    Comment: Glucose reference range applies only to samples taken after fasting for at least 8 hours.   BUN 21 8 - 23 mg/dL   Creatinine, Ser 1.91 (H) 0.44 - 1.00 mg/dL   Calcium 9.1 8.9 - 47.8 mg/dL   Total Protein 6.3 (L) 6.5 - 8.1 g/dL   Albumin 3.5 3.5 - 5.0 g/dL   AST 30 15 - 41 U/L   ALT 29 0 - 44 U/L   Alkaline Phosphatase 103 38 - 126 U/L   Total Bilirubin 0.6 0.3 - 1.2 mg/dL   GFR, Estimated 46 (L) >60 mL/min    Comment: (NOTE) Calculated using the CKD-EPI Creatinine Equation (2021)    Anion gap 10 5 - 15    Comment: Performed at [facility], [address] [place], [place] [address]  CBC with Differential     Status: Abnormal   Collection Time: 09/07/23  2:04 PM  Result Value Ref Range   WBC 5.5 4.0 - 10.5 K/uL   RBC 3.52 (L) 3.87 - 5.11 MIL/uL   Hemoglobin 10.2 (L) 12.0 - 15.0 g/dL   HCT 13.0 (L) 86.5 - 78.4 %   MCV 89.5 80.0 - 100.0 fL   MCH 29.0 26.0 - 34.0 pg   MCHC 32.4 30.0 - 36.0 g/dL   RDW 69.6 29.5 - 28.4 %   Platelets 301 150 - 400 K/uL   nRBC 0.0 0.0 - 0.2 %   Neutrophils Relative % 65 %   Neutro Abs 3.6 1.7 - 7.7 K/uL   Lymphocytes Relative 15 %   Lymphs Abs 0.8 0.7 - 4.0 K/uL   Monocytes Relative 9 %   Monocytes Absolute 0.5 0.1 - 1.0 K/uL   Eosinophils Relative 10 %   Eosinophils Absolute 0.6 (H) 0.0 - 0.5 K/uL   Basophils Relative 1 %   Basophils Absolute 0.1 0.0 - 0.1 K/uL   Immature Granulocytes 0 %   Abs Immature Granulocytes 0.02 0.00 - 0.07 K/uL    Comment: Performed at [facility], [address] [place], [place] [address]  Protime-INR     Status: None   Collection Time: 09/07/23  2:04 PM  Result Value Ref Range   Prothrombin Time 13.2 11.4 - 15.2 seconds   INR 1.0 0.8 - 1.2    Comment: (NOTE) INR goal varies based on device and disease states. Performed at [facility], [address] [place], [place] [address]   I-Stat Lactic Acid, ED     Status: None    Collection Time: 09/07/23  2:22 PM  Result Value Ref Range   Lactic Acid, Venous 1.6 0.5 - 1.9 mmol/L  I-Stat Lactic Acid, ED     Status: None   Collection Time: 09/07/23  6:49 PM  Result Value Ref Range   Lactic Acid, Venous 1.0 0.5 - 1.9 mmol/L  CBC     Status: Abnormal   Collection Time: 09/08/23  4:25 AM  Result Value Ref Range   WBC 3.7 (L) 4.0 - 10.5 K/uL   RBC 2.88 (L) 3.87 - 5.11 MIL/uL   Hemoglobin 8.4 (L) 12.0 - 15.0 g/dL   HCT 25.3 (L) 66.4 - 40.3 %   MCV 90.3 80.0 - 100.0 fL   MCH 29.2 26.0 - 34.0 pg   MCHC 32.3 30.0 - 36.0 g/dL   RDW 47.4 (H) 25.9 - 56.3 %   Platelets 260 150 -  400 K/uL   nRBC 0.0 0.0 - 0.2 %    Comment: Performed at [facility], [address] [place], [place] [address]  Basic metabolic panel     Status: Abnormal   Collection Time: 09/08/23  5:25 AM  Result Value Ref Range   Sodium 142 135 - 145 mmol/L   Potassium 4.6 3.5 - 5.1 mmol/L    Comment: HEMOLYSIS AT THIS LEVEL MAY AFFECT RESULT   Chloride 113 (H) 98 - 111 mmol/L   CO2 21 (L) 22 - 32 mmol/L   Glucose, Bld 67 (L) 70 - 99 mg/dL    Comment: Glucose reference range applies only to samples taken after fasting for at least 8 hours.   BUN 14 8 - 23 mg/dL   Creatinine, Ser 5.62 0.44 - 1.00 mg/dL   Calcium 7.5 (L) 8.9 - 10.3 mg/dL   GFR, Estimated >13 >08 mL/min    Comment: (NOTE) Calculated using the CKD-EPI Creatinine Equation (2021)    Anion gap 8 5 - 15    Comment: Performed at [facility], [address], [place], [place] [address]    DG Ankle Complete Right  Result Date: 09/07/2023 CLINICAL DATA:  Shin and heel wounds.  Infection. EXAM: RIGHT ANKLE - COMPLETE 3+ VIEW; RIGHT TIBIA AND FIBULA - 2 VIEW COMPARISON:  None Available. FINDINGS: Ankle: Plantar soft tissue defect typical of ulcer. This appears superficial by radiograph. There is generalized soft tissue edema about the ankle. No fracture, erosion, bony destructive change or periostitis. No radiopaque foreign body.  Tibia/fibula: Wound about the medial aspect of the lower leg. There is this subjacent cortical thickening in the mid aspect of the tibia. Multiple surgical clips in the adjacent soft tissues. No frank bony destructive change or erosions. Generalized soft tissue edema. No fracture. Knee arthroplasty is partially included. IMPRESSION: 1. Wound about the medial aspect of the lower leg. Cortical thickening in the mid aspect of the tibia at the level of wound may be sequela of prior infection. Consider MRI for further assessment of acute infection. 2. Plantar soft tissue defect typical of ulcer. Generalized soft tissue edema about the ankle and lower leg. Electronically Signed   By: Narda Rutherford M.D.   On: 09/07/2023 20:59   DG Tibia/Fibula Right  Result Date: 09/07/2023 CLINICAL DATA:  Shin and heel wounds.  Infection. EXAM: RIGHT ANKLE - COMPLETE 3+ VIEW; RIGHT TIBIA AND FIBULA - 2 VIEW COMPARISON:  None Available. FINDINGS: Ankle: Plantar soft tissue defect typical of ulcer. This appears superficial by radiograph. There is generalized soft tissue edema about the ankle. No fracture, erosion, bony destructive change or periostitis. No radiopaque foreign body. Tibia/fibula: Wound about the medial aspect of the lower leg. There is this subjacent cortical thickening in the mid aspect of the tibia. Multiple surgical clips in the adjacent soft tissues. No frank bony destructive change or erosions. Generalized soft tissue edema. No fracture. Knee arthroplasty is partially included. IMPRESSION: 1. Wound about the medial aspect of the lower leg. Cortical thickening in the mid aspect of the tibia at the level of wound may be sequela of prior infection. Consider MRI for further assessment of acute infection. 2. Plantar soft tissue defect typical of ulcer. Generalized soft tissue edema about the ankle and lower leg. Electronically Signed   By: Narda Rutherford M.D.   On: 09/07/2023 20:59    Review of Systems   Constitutional:  Negative for chills and fever.  HENT:  Negative for  drooling, facial swelling and trouble swallowing.   Eyes:  Negative for discharge and redness.  Respiratory:  Negative for shortness of breath and wheezing.   Gastrointestinal:  Negative for abdominal distention.  Musculoskeletal:  Positive for gait problem.  Skin:  Positive for wound.  Neurological:  Negative for facial asymmetry, speech difficulty and weakness.  Psychiatric/Behavioral:  Negative for agitation, behavioral problems and confusion.    Blood pressure (!) 149/53, pulse 78, temperature 97.6 F (36.4 C), temperature source Oral, resp. rate 14, height 5' (1.524 m), weight 55.3 kg, SpO2 99%. Physical Exam Constitutional:      General: She is not in acute distress.    Appearance: Normal appearance.  HENT:     Head: Normocephalic and atraumatic.     Mouth/Throat:     Mouth: Mucous membranes are moist.  Eyes:     General: No scleral icterus.    Extraocular Movements: Extraocular movements intact.  Pulmonary:     Effort: Pulmonary effort is normal.  Musculoskeletal:     Comments: Examination bilateral lower extremity shows extensive venous stasis type skin changes worse on the right than left.  There is improvement in her erythema and swelling compared to prior exam outpatient. She has a notable large beefy red chronic wound about the medial tibia without drainage. There is an ulcer about the plantar aspect of the right heel  with exposed subcutaneous tissue and fat.  There is mild drainage.  No gross purulence.  It is exquisitely tender to palpation.  I have difficulty palpating a reliable pedal pulse on the right.  She has a palpable pedal pulse on the left.  He has notable hallux varus positioning on the left.  Neurological:     Mental Status: She is alert and oriented to person, place, and time.  Psychiatric:        Mood and Affect: Mood normal.        Behavior: Behavior normal.      Assessment/Plan: Right greater than left lower extremity chronic venous stasis type changes and history of PAD with wounds, notably new worsening wound at the right plantar heel with likely cellulitis  [place] has a lengthy history of vascular issues and wounds in the bilateral lower extremities. She had worsening symptomatology despite outpatient treatment. Fortunately she seems to have had some improvement already with IV antibiotics since admission.  This is encouraging.  She has MRI studies pending to evaluate for deep infection.  We are hopeful she will not have a deep infection that would necessitate orthopedic intervention and that she will respond to IV antibiotics and further vascular workup/intervention.  There is no plan for orthopedic surgical intervention currently from our standpoint. If her MRI scans are revealing for deep infection would be happy to reevaluate if needed. She is well connected and followed by Dr. Lajoyce Corners as well.     Zakiah Beckerman J [place] 09/08/2023, 4:57 PM

## 2023-09-09 ENCOUNTER — Inpatient Hospital Stay (HOSPITAL_COMMUNITY): Payer: Medicare Other

## 2023-09-09 ENCOUNTER — Ambulatory Visit (HOSPITAL_COMMUNITY): Payer: Medicare Other | Attending: Vascular Surgery

## 2023-09-09 ENCOUNTER — Encounter: Payer: Medicare Other | Admitting: Vascular Surgery

## 2023-09-09 DIAGNOSIS — L97519 Non-pressure chronic ulcer of other part of right foot with unspecified severity: Secondary | ICD-10-CM | POA: Diagnosis not present

## 2023-09-09 DIAGNOSIS — L03119 Cellulitis of unspecified part of limb: Secondary | ICD-10-CM | POA: Diagnosis not present

## 2023-09-09 LAB — BASIC METABOLIC PANEL
Anion gap: 6 (ref 5–15)
BUN: 15 mg/dL (ref 8–23)
CO2: 26 mmol/L (ref 22–32)
Calcium: 8.4 mg/dL — ABNORMAL LOW (ref 8.9–10.3)
Chloride: 109 mmol/L (ref 98–111)
Creatinine, Ser: 0.83 mg/dL (ref 0.44–1.00)
GFR, Estimated: >60 mL/min (ref >60)
Glucose, Bld: 90 mg/dL (ref 70–99)
Potassium: 4.2 mmol/L (ref 3.5–5.1)
Sodium: 141 mmol/L (ref 135–145)

## 2023-09-09 LAB — CBC
HCT: 28.7 % — ABNORMAL LOW (ref 36.0–46.0)
Hemoglobin: 9.3 g/dL — ABNORMAL LOW (ref 12.0–15.0)
MCH: 29 pg (ref 26.0–34.0)
MCHC: 32.4 g/dL (ref 30.0–36.0)
MCV: 89.4 fL (ref 80.0–100.0)
Platelets: 256 10*3/uL (ref 150–400)
RBC: 3.21 MIL/uL — ABNORMAL LOW (ref 3.87–5.11)
RDW: 14.6 % (ref 11.5–15.5)
WBC: 4 10*3/uL (ref 4.0–10.5)
nRBC: 0 % (ref 0.0–0.2)

## 2023-09-09 MED ORDER — PROMETHAZINE HCL 6.25 MG/5ML PO SOLN
12.5000 mg | Freq: Four times a day (QID) | ORAL | Status: DC | PRN
  Administered 2023-09-09 – 2023-09-10 (×3): 12.5 mg via ORAL
  Filled 2023-09-09 (×6): qty 10

## 2023-09-09 MED ORDER — IBUPROFEN 400 MG PO TABS
600.0000 mg | ORAL_TABLET | Freq: Three times a day (TID) | ORAL | Status: DC
  Administered 2023-09-09: 600 mg via ORAL
  Filled 2023-09-09 (×4): qty 3
  Filled 2023-09-09 (×3): qty 2
  Filled 2023-09-09: qty 3

## 2023-09-09 MED ORDER — LEVOTHYROXINE SODIUM 100 MCG PO TABS
100.0000 ug | ORAL_TABLET | Freq: Every morning | ORAL | Status: DC
  Administered 2023-09-10 – 2023-09-16 (×6): 100 ug via ORAL
  Filled 2023-09-09: qty 1
  Filled 2023-09-09 (×2): qty 2
  Filled 2023-09-09: qty 1
  Filled 2023-09-09: qty 2
  Filled 2023-09-09: qty 1
  Filled 2023-09-09: qty 2
  Filled 2023-09-09: qty 1

## 2023-09-09 MED ORDER — LIDOCAINE 5 % EX PTCH
1.0000 | MEDICATED_PATCH | CUTANEOUS | Status: DC
  Administered 2023-09-09 – 2023-09-13 (×5): 1 via TRANSDERMAL
  Filled 2023-09-09 (×4): qty 1

## 2023-09-09 MED ORDER — OXYCODONE HCL 5 MG PO TABS
7.5000 mg | ORAL_TABLET | Freq: Four times a day (QID) | ORAL | Status: DC
  Administered 2023-09-09 – 2023-09-10 (×5): 7.5 mg via ORAL
  Filled 2023-09-09 (×5): qty 2

## 2023-09-09 NOTE — Assessment & Plan Note (Addendum)
Hgb improved to 9.3 this AM from 8.4 yesterday. Patient with no signs/symptoms consistent with active bleed. Anemia workup consistent with iron deficiency anemia.  - Continue home ferrous sulfate 325 mg daily PO

## 2023-09-09 NOTE — Assessment & Plan Note (Addendum)
Patient with R shin wound and R heel ulcer ongoing for several months. Here for IV Abx after failed outpatient Abx course, on Vanc, Cefepime, Flagyl due to prior cultures with bacteroides. Improved pain of R heel today after wrapping and pressure relief. Afebrile with no leukocytosis or systemic symptoms. Hemodynamically stable. 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

## 2023-09-09 NOTE — Evaluation (Signed)
Physical Therapy Evaluation Patient Details Name: Isabel Barnes MRN: [id number] DOB: 05-Apr-1941 Today's Date: 09/09/2023  History of Present Illness  82 yo female presents to [facility] on 10/9 on recommendation from ortho for non-healing R foot infection, has been treated with abx. PMH includes HTN, HLD, chronic venous insufficiency.  Clinical Impression  Pt presents to PT with deficits in activity tolerance, primarily due to heel pain. Pt is able to ambulate for short household distances with support of RW. PT limits distance due to non-healing wound on R foot. Pt also reports chronic posterior headache and muscle tension in upper neck. PT provides education on suboccipital release techniques to perform, also recommends outpatient PT follow up for management of chronic neck pain.        If plan is discharge home, recommend the following: Assistance with cooking/housework;A little help with bathing/dressing/bathroom;Assist for transportation   Can travel by private vehicle        Equipment Recommendations None recommended by PT  Recommendations for Other Services       Functional Status Assessment Patient has had a recent decline in their functional status and demonstrates the ability to make significant improvements in function in a reasonable and predictable amount of time.     Precautions / Restrictions Precautions Precautions: Fall Restrictions Weight Bearing Restrictions: No      Mobility  Bed Mobility Overal bed mobility: Independent                  Transfers Overall transfer level: Modified independent Equipment used: Rolling walker (2 wheels) Transfers: Sit to/from Stand Sit to Stand: Modified independent (Device/Increase time)                Ambulation/Gait Ambulation/Gait assistance: Supervision Gait Distance (Feet): 40 Feet Assistive device: Rolling walker (2 wheels) Gait Pattern/deviations: Step-through pattern Gait velocity: reduced Gait  velocity interpretation: <1.8 ft/sec, indicate of risk for recurrent falls   General Gait Details: slowed step-through gait, reduced stride length and stance time on RLE  Stairs            Wheelchair Mobility     Tilt Bed    Modified Rankin (Stroke Patients Only)       Balance Overall balance assessment: Needs assistance Sitting-balance support: No upper extremity supported, Feet supported Sitting balance-Leahy Scale: Good     Standing balance support: Single extremity supported, Reliant on assistive device for balance Standing balance-Leahy Scale: Poor                               Pertinent Vitals/Pain Pain Assessment Pain Assessment: 0-10 Pain Score: 9  Pain Location: head Pain Descriptors / Indicators: Headache Pain Intervention(s): Monitored during session    Home Living Family/patient expects to be discharged to:: Private residence Living Arrangements: Alone Available Help at Discharge: Personal care attendant (3 days a week for 4 hours) Type of Home: House Home Access: Level entry   Entrance Stairs-Number of Steps: 0   Home Layout: One level Home Equipment: Shower seat - built Charity fundraiser (2 wheels);Cane - single point;Shower seat;Grab bars - tub/shower;Grab bars - toilet Additional Comments: 2-3 falls in the last year due to losing balance- usually without RW. Has a life alert    Prior Function Prior Level of Function : History of Falls (last six months)             Mobility Comments: modI with RW ADLs Comments: Ind for ADLs, does  not shower unless PCA can supervises. Assist with iADLs     Extremity/Trunk Assessment   Upper Extremity Assessment Upper Extremity Assessment: Overall WFL for tasks assessed    Lower Extremity Assessment Lower Extremity Assessment: RLE deficits/detail RLE Deficits / Details: R heel wound, strength WFL    Cervical / Trunk Assessment Cervical / Trunk Assessment: Normal  Communication    Communication Communication: No apparent difficulties Cueing Techniques: Verbal cues  Cognition Arousal: Alert Behavior During Therapy: WFL for tasks assessed/performed Overall Cognitive Status: Within Functional Limits for tasks assessed                                          General Comments General comments (skin integrity, edema, etc.): VSS on RA    Exercises Other Exercises Other Exercises: PT provides education on suboccipital release to aide in reducing tension and HA in upper neck and occipital region   Assessment/Plan    PT Assessment Patient needs continued PT services  PT Problem List Decreased strength;Decreased activity tolerance;Decreased balance;Decreased mobility;Decreased knowledge of use of DME;Decreased safety awareness;Pain       PT Treatment Interventions DME instruction;Gait training;Functional mobility training;Therapeutic activities;Therapeutic exercise;Balance training;Neuromuscular re-education;Patient/family education    PT Goals (Current goals can be found in the Care Plan section)  Acute Rehab PT Goals Patient Stated Goal: to return home, heal wound PT Goal Formulation: With patient Time For Goal Achievement: 09/23/23 Potential to Achieve Goals: Good Additional Goals Additional Goal #1: Pt will score >45/56 on the BERG to indicate a reduced risk for falls    Frequency Min 1X/week     Co-evaluation               AM-PAC PT "6 Clicks" Mobility  Outcome Measure Help needed turning from your back to your side while in a flat bed without using bedrails?: None Help needed moving from lying on your back to sitting on the side of a flat bed without using bedrails?: None Help needed moving to and from a bed to a chair (including a wheelchair)?: None Help needed standing up from a chair using your arms (e.g., wheelchair or bedside chair)?: None Help needed to walk in hospital room?: A Little Help needed climbing 3-5 steps with  a railing? : A Little 6 Click Score: 22    End of Session   Activity Tolerance: Patient tolerated treatment well Patient left: in bed;with call bell/phone within reach Nurse Communication: Mobility status PT Visit Diagnosis: Other abnormalities of gait and mobility (R26.89);Pain Pain - Right/Left: Right Pain - part of body: Ankle and joints of foot    Time: 1150-1210 PT Time Calculation (min) (ACUTE ONLY): 20 min   Charges:   PT Evaluation $PT Eval Low Complexity: 1 Low   PT General Charges $$ ACUTE PT VISIT: 1 Visit         Arlyss Gandy, PT, DPT Acute Rehabilitation Office [phone]   Arlyss Gandy 09/09/2023, 12:44 PM

## 2023-09-09 NOTE — Plan of Care (Signed)

## 2023-09-09 NOTE — Evaluation (Signed)
Occupational Therapy Evaluation Patient Details Name: Isabel Barnes MRN: [id number] DOB: 1941/07/08 Today's Date: 09/09/2023   History of Present Illness 82 yo female presents to [facility] on 10/9 on recommendation from ortho for non-healing R foot infection, has been treated with abx. PMH includes HTN, HLD, chronic venous insufficiency.   Clinical Impression   Pt admitted for above, presents with R foot pain which impacts her activity tolerance and gait. Pt partially soiled upon entry, she is completing bADLs with CGA to supervision/setup assist. Pt aware of deficits and even wraps her own ace bandages from time to time. She has a PCA 3x/week 4hrs each day so she needs to be mostly independent at DC. Pt would benefit from continued acute skilled OT services to address deficits and help transition to next level of care. Anticipate no follow-up OT needed at DC.       If plan is discharge home, recommend the following: Assistance with cooking/housework    Functional Status Assessment  Patient has had a recent decline in their functional status and demonstrates the ability to make significant improvements in function in a reasonable and predictable amount of time.  Equipment Recommendations  None recommended by OT (Pt purchased her own RW, could get a new one through insurance if needed)    Recommendations for Other Services       Precautions / Restrictions Precautions Precautions: Fall Restrictions Weight Bearing Restrictions: No      Mobility Bed Mobility Overal bed mobility: Modified Independent             General bed mobility comments: increased time needed, R foot elevated    Transfers Overall transfer level: Needs assistance Equipment used: Rolling walker (2 wheels) Transfers: Sit to/from Stand Sit to Stand: Supervision           General transfer comment: STS x3 with Supervision while completing dressing and linen changes. Good use of hand placement with RW       Balance Overall balance assessment: Needs assistance Sitting-balance support: Feet supported, No upper extremity supported Sitting balance-Leahy Scale: Good Sitting balance - Comments: sitting EOB     Standing balance-Leahy Scale: Fair Standing balance comment: Static stands no AD                           ADL either performed or assessed with clinical judgement   ADL Overall ADL's : Needs assistance/impaired Eating/Feeding: Independent;Sitting   Grooming: Set up;Sitting   Upper Body Bathing: Set up;Sitting   Lower Body Bathing: Set up;Sitting/lateral leans   Upper Body Dressing : Sitting;Set up Upper Body Dressing Details (indicate cue type and reason): don/doff gown Lower Body Dressing: Contact guard assist;Sit to/from stand Lower Body Dressing Details (indicate cue type and reason): Pt doffing/donning briefs with CGA, fixing RLE ace wraps without assist Toilet Transfer: Contact guard assist;Rolling walker (2 wheels);Ambulation   Toileting- Clothing Manipulation and Hygiene: Contact guard assist;Sit to/from stand               Vision         Perception         Praxis         Pertinent Vitals/Pain Pain Assessment Pain Assessment: 0-10 Pain Score: 9  Pain Location: R foot Pain Descriptors / Indicators: Aching, Discomfort, Grimacing, Guarding, Sore Pain Intervention(s): Limited activity within patient's tolerance, Monitored during session, Repositioned     Extremity/Trunk Assessment Upper Extremity Assessment Upper Extremity Assessment: Generalized weakness  Lower Extremity Assessment Lower Extremity Assessment: Defer to PT evaluation       Communication Communication Communication: No apparent difficulties Cueing Techniques: Verbal cues   Cognition Arousal: Alert Behavior During Therapy: WFL for tasks assessed/performed Overall Cognitive Status: Within Functional Limits for tasks assessed                                  General Comments: Very pleasant     General Comments  VSS on RA. RN notified that Pt IV is bleeding, heat packs distributed for neck pain    Exercises     Shoulder Instructions      Home Living Family/patient expects to be discharged to:: Private residence Living Arrangements: Alone Available Help at Discharge: Personal care attendant (3 days per week, 4 hours per day) Type of Home: House   Entrance Stairs-Number of Steps: 0   Home Layout: One level     Bathroom Shower/Tub: Producer, television/film/video: Standard Bathroom Accessibility: Yes How Accessible: Accessible via walker Home Equipment: Shower seat - built Charity fundraiser (2 wheels);Cane - single point;Shower seat;Grab bars - tub/shower;Grab bars - toilet   Additional Comments: 2-3 falls in the last year due to losing balance- usually without RW. Has a life alert      Prior Functioning/Environment Prior Level of Function : History of Falls (last six months)             Mobility Comments: Ind with RW ADLs Comments: Ind for ADLs, does not shower unless PCA can supervises. Assist with iADLs        OT Problem List: Impaired balance (sitting and/or standing);Decreased strength;Pain      OT Treatment/Interventions: Self-care/ADL training;Balance training;Therapeutic exercise;Therapeutic activities;Patient/family education    OT Goals(Current goals can be found in the care plan section) Acute Rehab OT Goals Patient Stated Goal: To go home OT Goal Formulation: With patient Time For Goal Achievement: 09/23/23 Potential to Achieve Goals: Good ADL Goals Pt Will Perform Grooming: with modified independence;standing Pt Will Perform Lower Body Dressing: with supervision;with set-up;sit to/from stand;sitting/lateral leans Pt Will Transfer to Toilet: with modified independence;ambulating Pt Will Perform Toileting - Clothing Manipulation and hygiene: with modified independence;sit to/from stand  OT  Frequency: Min 1X/week    Co-evaluation              AM-PAC OT "6 Clicks" Daily Activity     Outcome Measure Help from another person eating meals?: None Help from another person taking care of personal grooming?: A Little Help from another person toileting, which includes using toliet, bedpan, or urinal?: A Little Help from another person bathing (including washing, rinsing, drying)?: A Little Help from another person to put on and taking off regular upper body clothing?: A Little Help from another person to put on and taking off regular lower body clothing?: A Little 6 Click Score: 19   End of Session Equipment Utilized During Treatment: Gait belt;Rolling walker (2 wheels) Nurse Communication: Mobility status  Activity Tolerance: Patient tolerated treatment well Patient left: in bed;with call bell/phone within reach;with bed alarm set  OT Visit Diagnosis: Other abnormalities of gait and mobility (R26.89);Pain;Muscle weakness (generalized) (M62.81);Unsteadiness on feet (R26.81) Pain - Right/Left: Right Pain - part of body: Ankle and joints of foot                Time: 5638-7564 OT Time Calculation (min): 34 min Charges:  OT General Charges $OT  Visit: 1 Visit OT Evaluation $OT Eval Moderate Complexity: 1 Mod OT Treatments $Self Care/Home Management : 8-22 mins  09/09/2023  AB, OTR/L  Acute Rehabilitation Services  Office: [phone]   Tristan Schroeder 09/09/2023, 10:36 AM

## 2023-09-09 NOTE — Progress Notes (Signed)
Daily Progress Note Intern Pager: [phone]  Patient name: Isabel Barnes Medical record number: [id number] Date of birth: [birth year] Age: 82 y.o. Gender: female  Primary Care Provider: Jamal Collin, PA-C Consultants: Orthopedic Surgery Code Status: DNR   Pt Overview and Major Events to Date:  10/09: Admitted to FMTS   Assessment and Plan:   Isabel Barnes is a 82 yo female with past medical history of peripheral arterial disease, chronic venous insufficiency, CVA (remote R cerebellar infarct incidentally noted in 01/2022), HTN, hypothyroidism, and depression who presents here with chronic R heel ulcer, R leg wound, and LLE cellulitis.  Assessment & Plan RLE Wound  R Heel Ulcer Patient with R shin wound and R heel ulcer ongoing for several months. Here for IV Abx after failed outpatient Abx course, on Vanc, Cefepime, Flagyl due to prior cultures with bacteroides. Improved pain of R heel today after wrapping and pressure relief. Afebrile with no leukocytosis or systemic symptoms. Hemodynamically stable. 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  - Orthopedic Surgery consulted, appreciate recommendations - No surgical intervention at this time, pending their review of imaging studies with evidence of chronic osteomyelitis - Vascular Surgery consulted, appreciate recommendations  - ABI  - Aortogram with possible intervention 10/14 - ID consulted, appreciate recommendations  - Continue IV Abx with Vanc, Cefepime, and Flagyl - WOC consulted  LLE Cellulitis Patient with LLE cellulitis of L ankle and shin with no induration, swelling, or drainage. Followed by Orthopedic Surgery outpatient as well. Afebrile, with no leukocytosis or sick symptoms.  - Antibiotic treatment as above - Follow up blood cultures  Peripheral arterial disease (HCC) Patient with history  of extensive peripheral arterial disease with chronic claudication symptoms, no worse here. Per review, last aortogram completed in 2023 with no vascular intervention at that time, but occlusions present on R side. Patient put on ASA and Crestor for PAD, has not been taking Crestor, intermittently takes ASA.  - Continue ASA 81 mg daily - Vascular Surgery consulted  - ABI  - Aortogram 10/14 Chronic pain disorder Patient complaining of arthritic neck pain and R heel pain. 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 Home regimen is prescribed as Tylenol 1g TID and Norco q6h PRN, however, patient does not take Tylenol and takes Norco 10 q4h at home. Hemodynamically stable, resting in bed.  - STOP Norco - START Oxycodone 7.5 mg q6h scheduled - Continue Tylenol 1000 mg TID  - START Ibuprofen 600 mg TID - START Lidocaine patches daily  Anemia Hgb improved to 9.3 this AM from 8.4 yesterday. Patient with no signs/symptoms consistent with active bleed. Anemia workup consistent with iron deficiency anemia.  - Continue home ferrous sulfate 325 mg daily PO  Atrial fibrillation (HCC) History of paroxysmal atrial fibrillation in 2023, previously on amiodarone 200 mg BID, patient reports this medication was discontinued months ago, she is unsure when/for what reason. No chest pain, palpitations, shortness of breath. Cardiopulmonary exam without evidence of Afib. Hemodynamically stable.  - Follow up EKG - Continue to monitor vitals and symptoms  Aortic valve stenosis Patient with old systolic murmur, previously documented. Echo in 2023 with normal LVEF, and significant aortic stenosis. Patient with no chest pain, shortness of breath. Hemodynamically stable.  - Continue to monitor vitals and symptoms AKI (acute kidney injury) (HCC) Cr of 0.83  this AM. Patient with adequate appetite and intake. Asymptomatic otherwise. AKI resolved.  - BMP in AM to monitor renal  function - Encourage PO intake  Chronic and Stable Conditions: HTN: Holding home Losartan due to soft Bps Hypothyroidism: Continue home Synthroid 50 mcg GERD: Continue Pantoprazole 40 mg BID Insomnia: Continue Melatonin 3 mg nightly    FEN/GI: Regular diet VTE Prophylaxis: Lovenox Dispo: Home pending clinical improvement  Subjective:  Patient reports persistent pain in the back of her neck, states this has been ongoing for several months prior to admission and is related to her chronic arthritis. Typically takes her home Norco q4h for this at home. No neck stiffness. Also reports pain at the R heel ulcer site, but improved since wrapping and relief of pressure on the area. No chest pain, shortness of breath, focal weakness/numbness, abdominal pain, nausea, vomiting.   Objective: Temp:  [97.6 F (36.4 C)-98.5 F (36.9 C)] 98.5 F (36.9 C) (10/11 0737) Pulse Rate:  [64-80] 73 (10/11 0737) Resp:  [14-21] 16 (10/11 0737) BP: (109-151)/(37-77) 141/58 (10/11 0737) SpO2:  [95 %-99 %] 96 % (10/11 0737) Physical Exam: General: Well-appearing, lying in bed in no acute distress.  Cardiovascular: 4/6 holosystolic murmur noted on exam, rate regular.  Respiratory: Lungs clear to auscultation with no focal wheezing, rales, or rhonchi noted.  Abdomen: Soft, nontender, nondistended, with no rebound or guarding.  Extremities: Wounds wrapped per WOC instructions  Laboratory: Most recent CBC Lab Results  Component Value Date   WBC 3.7 (L) 09/08/2023   HGB 8.4 (L) 09/08/2023   HCT 26.0 (L) 09/08/2023   MCV 90.3 09/08/2023   PLT 260 09/08/2023   Most recent BMP    Latest Ref Rng & Units 09/08/2023    5:25 AM  BMP  Glucose 70 - 99 mg/dL 67   BUN 8 - 23 mg/dL 14   Creatinine 1.30 - 1.00 mg/dL 8.65   Sodium 784 - 696 mmol/L 142   Potassium 3.5 - 5.1 mmol/L 4.6   Chloride 98 - 111 mmol/L 113   CO2 22 - 32 mmol/L 21   Calcium 8.9 - 10.3 mg/dL 7.5    Other pertinent labs: Lactic Acid:  1.6 > 1.0 PT/INR: 13.2/1.0 Blood cultures: pending    Imaging/Diagnostic Tests: XR Ankle:  IMPRESSION: 1. Wound about the medial aspect of the lower leg. Cortical thickening in the mid aspect of the tibia at the level of wound may be sequela of prior infection. Consider MRI for further assessment of acute infection. 2. Plantar soft tissue defect typical of ulcer. Generalized soft tissue edema about the ankle and lower leg.   XR Tib/Fib:  IMPRESSION: 1. Wound about the medial aspect of the lower leg. Cortical thickening in the mid aspect of the tibia at the level of wound may be sequela of prior infection. Consider MRI for further assessment of acute infection. 2. Plantar soft tissue defect typical of ulcer. Generalized soft tissue edema about the ankle and lower leg.  MRI Ankle:  IMPRESSION: 1. As seen on prior radiographs, there is smooth cortical thickening of the anteromedial tibial diaphysis in the region of a medial calf soft tissue wound that measures up to approximately 13.5 cm in craniocaudal dimension. 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 No definite extension into the deeper bone marrow. 2. There is diffuse right calf moderate to high-grade subcutaneous fat edema and swelling with associated skin thickening. This is compatible with diffuse cellulitis. Note  is made that similar and likely even higher grade edema and swelling were seen on prior 10/18/2020 ankle MRI.  MRI Tib/Fib:  IMPRESSION: 1. Shallow ulcer within the superficial subcutaneous fat of the plantar, slightly medial heel. Deep to this, there is a second tiny pocket of fluid just plantar to the origin of the medial band of the plantar fascia, measuring up to 8 mm. This is nonspecific and could represent a tiny abscess. As described above, no definite cortical erosion or marrow edema is seen within the calcaneal bone related  to this fluid to indicate acute osteomyelitis. 2. Mild-to-moderate diffuse midfoot and forefoot and distal calf subcutaneous fat edema, enhancement, and swelling is mildly decreased from 10/18/2020. This is nonspecific but may represent cellulitis. 3. Longitudinal partial-thickness midsubstance tear of the peroneus brevis tendon, similar to prior. 4. Mild flexor hallucis longus tenosynovitis, mildly increased from prior.  Isabel Barnes, Medical Student 09/09/2023, 7:54 AM  PGY-, [facility] FPTS Intern pager: [phone], text pages welcome Secure chat group [facility] Teaching Service

## 2023-09-09 NOTE — Progress Notes (Signed)
     Isabel Barnes is a 82 y.o. female   We have reviewed the patient's MRI scans.  No indication for urgent orthopedic intervention.  Recommend continuing with medical treatments and vascular workup/intervention.  We will follow from a distance.  [name] J. [place], PA-C

## 2023-09-09 NOTE — Assessment & Plan Note (Addendum)
Cr of 0.83 this AM. Patient with adequate appetite and intake. Asymptomatic otherwise. AKI resolved.  - BMP in AM to monitor renal function - Encourage PO intake

## 2023-09-09 NOTE — TOC Initial Note (Signed)
Transition of Care [facility]) - Initial/Assessment Note    Patient Details  Name: Isabel Barnes MRN: [id number] Date of Birth: Feb 10, 1941  Transition of Care [facility]) CM/SW Contact:    Janae Bridgeman, RN Phone Number: 09/09/2023, 3:51 PM  Clinical Narrative:                 CM met with the patient at the bedside to discuss TOC needs.  The patient admitted for peripheral vascular disorder and right foot infection and plans to return home when she is medically stable.  Patient has a private paid caregiver at the home 3 x per week - named Beckie Salts.  DME at the home includes shower seat, RW, Cane grab bars, and life alert.  The patient was agreeable to Outpatient referral for physical therapy - referral placed in the hub for the [facility] location.  No other TOC needs at this time.  Expected Discharge Plan: OP Rehab Barriers to Discharge: Continued Medical Work up   Patient Goals and CMS Choice Patient states their goals for this hospitalization and ongoing recovery are:: To get better and return home CMS Medicare.gov Compare Post Acute Care list provided to:: Patient Choice offered to / list presented to : Patient [place] ownership interest in [facility].provided to:: Patient    Expected Discharge Plan and Services   Discharge Planning Services: CM Consult Post Acute Care Choice: Resumption of Svcs/PTA Provider Living arrangements for the past 2 months: Single Family Home                                      Prior Living Arrangements/Services Living arrangements for the past 2 months: Single Family Home Lives with:: Self Patient language and need for interpreter reviewed:: Yes Do you feel safe going back to the place where you live?: Yes      Need for Family Participation in Patient Care: Yes (Comment) Care giver support system in place?: Yes (comment) Current home services: DME Criminal Activity/Legal Involvement Pertinent to Current  Situation/Hospitalization: No - Comment as needed  Activities of Daily Living      Permission Sought/Granted Permission sought to share information with : Facility Industrial/product designer granted to share information with : Yes, Verbal Permission Granted     Permission granted to share info w AGENCY: OP Referral placed for OUtpatient physical therapy        Emotional Assessment Appearance:: Appears stated age Attitude/Demeanor/Rapport: Gracious Affect (typically observed): Accepting Orientation: : Oriented to Self, Oriented to Place, Oriented to  Time Alcohol / Substance Use: Not Applicable Psych Involvement: No (comment)  Admission diagnosis:  Cellulitis [L03.90] AKI (acute kidney injury) (HCC) [N17.9] Cellulitis of lower extremity, unspecified laterality [L03.119] Ulcer of right foot, unspecified ulcer stage (HCC) [L97.519] Patient Active Problem List   Diagnosis Date Noted   RLE Wound  R Heel Ulcer 09/08/2023   AKI (acute kidney injury) (HCC) 09/08/2023   LLE Cellulitis 09/07/2023   Abscess of right lower leg    Severe protein-calorie malnutrition (HCC)    Peripheral arterial disease (HCC)    Fall    Atrial fibrillation (HCC)    Goals of care, counseling/discussion    Anemia    Fluid collection (edema) in the arms, legs, hands and feet    Cellulitis of right lower extremity 02/06/2022   Chest pain, rule out acute myocardial infarction 03/16/2021   HTN (hypertension) 03/16/2021  Hypothyroidism 03/16/2021   HLD (hyperlipidemia) 03/16/2021   Aortic valve stenosis 03/16/2021   Chronic venous insufficiency of lower extremity 03/16/2021   Short-term memory loss 12/13/2019   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   Primary osteoarthritis of right knee 07/16/2016   Eosinophilia 06/16/2015   Chronic pain disorder    Osteoarthritis    Incisional umbilical hernia, without obstruction or gangrene    Iron deficiency anemia 09/19/2012   PCP:  Jamal Collin,  PA-C Pharmacy:   [facility] [phone] - [place], [place] - [address] [place] [address] Phone: [phone] Fax: [phone]  [facility] - [place], [place] - [address] [place] [address] Phone: [phone] Fax: [phone]     Social Determinants of Health (SDOH) Social History: SDOH Screenings   Food Insecurity: No Food Insecurity (05/02/2023)   Received from [employer] Needs: No Transportation Needs (05/02/2023)   Received from [facility]  Utilities: Not At Risk (05/02/2023)   Received from [facility]  Financial Resource Strain: Low Risk  (05/02/2023)   Received from [facility]  Physical Activity: Unknown (05/02/2023)   Received from [facility]  Social Connections: Moderately Integrated (05/02/2023)   Received from [facility]  Stress: No Stress Concern Present (05/02/2023)   Received from [facility]  Tobacco Use: Medium Risk (09/08/2023)   SDOH Interventions:     Readmission Risk Interventions    09/09/2023    3:51 PM  Readmission Risk Prevention Plan  Post Dischage Appt Complete  Medication Screening Complete  Transportation Screening Complete

## 2023-09-09 NOTE — Assessment & Plan Note (Addendum)
Patient complaining of arthritic neck pain and R heel pain. 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 Home regimen is prescribed as Tylenol 1g TID and Norco q6h PRN, however, patient does not take Tylenol and takes Norco 10 q4h at home. Hemodynamically stable, resting in bed.  - STOP Norco - START Oxycodone 7.5 mg q6h scheduled - Continue Tylenol 1000 mg TID  - START Ibuprofen 600 mg TID - START Lidocaine patches daily

## 2023-09-09 NOTE — Progress Notes (Signed)
Had a notification from [facility] that patient is exceeding the max dose of 4000 mg of acetaminophen. This RN reached out to Cyndia Skeeters, DO who stated that "She can still have her tylenol and a dose of norco if needed." Norco was administered per doctor's approval due to patient pain 8/10.  Isabel Barnes

## 2023-09-09 NOTE — Assessment & Plan Note (Addendum)
Patient with history of extensive peripheral arterial disease with chronic claudication symptoms, no worse here. Per review, last aortogram completed in 2023 with no vascular intervention at that time, but occlusions present on R side. Patient put on ASA and Crestor for PAD, has not been taking Crestor, intermittently takes ASA.  - Continue ASA 81 mg daily - Vascular Surgery consulted  - ABI  - Aortogram 10/14

## 2023-09-09 NOTE — Assessment & Plan Note (Addendum)
Patient with LLE cellulitis of L ankle and shin with no induration, swelling, or drainage. Followed by Orthopedic Surgery outpatient as well. Afebrile, with no leukocytosis or sick symptoms.  - Antibiotic treatment as above - Follow up blood cultures

## 2023-09-09 NOTE — Progress Notes (Signed)
ABI has been completed.     Results can be found under chart review under CV PROC. 09/09/2023 6:08 PM Jill Stopka RVT, RDMS

## 2023-09-09 NOTE — Assessment & Plan Note (Addendum)
History of paroxysmal atrial fibrillation in 2023, previously on amiodarone 200 mg BID, patient reports this medication was discontinued months ago, she is unsure when/for what reason. No chest pain, palpitations, shortness of breath. Cardiopulmonary exam without evidence of Afib. Hemodynamically stable.  - Follow up EKG - Continue to monitor vitals and symptoms

## 2023-09-09 NOTE — Assessment & Plan Note (Addendum)
Patient with old systolic murmur, previously documented. Echo in 2023 with normal LVEF, and significant aortic stenosis. Patient with no chest pain, shortness of breath. Hemodynamically stable.  - Continue to monitor vitals and symptoms

## 2023-09-09 NOTE — Progress Notes (Addendum)
      Right LE without change dressing clean and dry intact right LE with mild compression using ace wrap   Mixed venous/lymph edema and PAD with acute plantar heel wound right foot and chronic medial calf venous wound Currently on IV antibiotics Pending ABI today Plan for angiogram with possible intervention right LE Monday    MRI Tib/fib 1. As seen on prior radiographs, there is smooth cortical thickening of the anteromedial tibial diaphysis in the region of a medial calf soft tissue wound that measures up to approximately 13.5 cm in craniocaudal dimension. 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 No definite extension into the deeper bone marrow. 2. There is diffuse right calf moderate to high-grade subcutaneous fat edema and swelling with associated skin thickening. This is compatible with diffuse cellulitis. Note is made that similar and likely even higher grade edema and swelling were seen on prior  MRI right heel  IMPRESSION: 1. Shallow ulcer within the superficial subcutaneous fat of the plantar, slightly medial heel. Deep to this, there is a second tiny pocket of fluid just plantar to the origin of the medial band of the plantar fascia, measuring up to 8 mm. This is nonspecific and could represent a tiny abscess. As described above, no definite cortical erosion or marrow edema is seen within the calcaneal bone related to this fluid to indicate acute osteomyelitis. 2. Mild-to-moderate diffuse midfoot and forefoot and distal calf subcutaneous fat edema, enhancement, and swelling is mildly decreased from 10/18/2020. This is nonspecific but may represent cellulitis. 3. Longitudinal partial-thickness midsubstance tear of the peroneus brevis tendon, similar to prior. 4. Mild flexor hallucis longus tenosynovitis, mildly increased from prior.   Mosetta Pigeon PA-C  VVS  VASCULAR STAFF  ADDENDUM: I have independently interviewed and examined the patient. I agree with the above.   Rande Brunt. Lenell Antu, MD [facility] Phone Number: [phone] 09/09/2023 12:20 PM

## 2023-09-10 ENCOUNTER — Encounter (HOSPITAL_COMMUNITY): Payer: Self-pay | Admitting: Family Medicine

## 2023-09-10 DIAGNOSIS — I70232 Atherosclerosis of native arteries of right leg with ulceration of calf: Secondary | ICD-10-CM | POA: Diagnosis not present

## 2023-09-10 DIAGNOSIS — L97519 Non-pressure chronic ulcer of other part of right foot with unspecified severity: Secondary | ICD-10-CM | POA: Diagnosis not present

## 2023-09-10 LAB — CBC
HCT: 26.6 % — ABNORMAL LOW (ref 36.0–46.0)
Hemoglobin: 8.6 g/dL — ABNORMAL LOW (ref 12.0–15.0)
MCH: 28.1 pg (ref 26.0–34.0)
MCHC: 32.3 g/dL (ref 30.0–36.0)
MCV: 86.9 fL (ref 80.0–100.0)
Platelets: 231 10*3/uL (ref 150–400)
RBC: 3.06 MIL/uL — ABNORMAL LOW (ref 3.87–5.11)
RDW: 14.7 % (ref 11.5–15.5)
WBC: 4 10*3/uL (ref 4.0–10.5)
nRBC: 0 % (ref 0.0–0.2)

## 2023-09-10 LAB — VAS US ABI WITH/WO TBI: Left ABI: 1.49

## 2023-09-10 LAB — BASIC METABOLIC PANEL
Anion gap: 6 (ref 5–15)
BUN: 14 mg/dL (ref 8–23)
CO2: 24 mmol/L (ref 22–32)
Calcium: 8.4 mg/dL — ABNORMAL LOW (ref 8.9–10.3)
Chloride: 109 mmol/L (ref 98–111)
Creatinine, Ser: 0.89 mg/dL (ref 0.44–1.00)
GFR, Estimated: >60 mL/min (ref >60)
Glucose, Bld: 85 mg/dL (ref 70–99)
Potassium: 4.1 mmol/L (ref 3.5–5.1)
Sodium: 139 mmol/L (ref 135–145)

## 2023-09-10 MED ORDER — SENNOSIDES-DOCUSATE SODIUM 8.6-50 MG PO TABS
1.0000 | ORAL_TABLET | Freq: Every day | ORAL | Status: DC
  Administered 2023-09-10 – 2023-09-15 (×5): 1 via ORAL
  Filled 2023-09-10 (×6): qty 1

## 2023-09-10 MED ORDER — ACETAMINOPHEN 325 MG PO TABS
650.0000 mg | ORAL_TABLET | Freq: Three times a day (TID) | ORAL | Status: DC
  Administered 2023-09-10 – 2023-09-16 (×12): 650 mg via ORAL
  Filled 2023-09-10 (×14): qty 2

## 2023-09-10 MED ORDER — HYDROCODONE-ACETAMINOPHEN 10-325 MG PO TABS
1.0000 | ORAL_TABLET | Freq: Four times a day (QID) | ORAL | Status: DC | PRN
  Administered 2023-09-10 – 2023-09-14 (×13): 1 via ORAL
  Filled 2023-09-10 (×13): qty 1

## 2023-09-10 MED ORDER — OXYCODONE HCL 5 MG PO TABS
2.5000 mg | ORAL_TABLET | Freq: Once | ORAL | Status: AC
  Administered 2023-09-10: 2.5 mg via ORAL
  Filled 2023-09-10: qty 1

## 2023-09-10 NOTE — Assessment & Plan Note (Signed)
History of paroxysmal atrial fibrillation in 2023, previously on amiodarone 200 mg BID, patient reports this medication was discontinued months ago, she is unsure when/for what reason. No chest pain, palpitations, shortness of breath. Cardiopulmonary exam without evidence of Afib. Hemodynamically stable. EKG in NSR. - Continue to monitor vitals and symptoms

## 2023-09-10 NOTE — Assessment & Plan Note (Signed)
Patient with history of extensive peripheral arterial disease with chronic claudication symptoms, no worse here. Per review, last aortogram completed in 2023 with no vascular intervention at that time, but occlusions present on R side. Patient put on ASA and Crestor for PAD, has not been taking Crestor, intermittently takes ASA.  - Continue ASA 81 mg daily - Vascular Surgery consulted  - ABIs abnormal as above  - Aortogram 10/14

## 2023-09-10 NOTE — Progress Notes (Signed)
  Daily Progress Note  S/p:n/a  Subjective: No complaints today. Slightly depressed about wound not healing  Objective: Vitals:   09/10/23 0545 09/10/23 0700  BP: (!) 141/47 (!) 140/71  Pulse: 72 65  Resp: 19 18  Temp: 98 F (36.7 C) 98 F (36.7 C)  SpO2: 96% 94%    Physical Examination HDS Normal work of breathing Dressing clean and dry  ASSESSMENT/PLAN:  Ms. Isabel Barnes is a 82 year old female with a chronic right lower extremity wound due to PAD and lymphedema.  She also has a new right plantar heel wound. ABI performed yesterday was noncompressible on the right with an ischemic toe pressure of 40 Plan for angiogram on Monday with either myself or Dr. Randie Heinz Reviewed risks and benefits, she expressed understanding and is willing to proceed    Daria Pastures MD MS [facility] [phone] 09/10/2023  10:33 AM

## 2023-09-10 NOTE — Assessment & Plan Note (Signed)
Patient with old systolic murmur, previously documented. Echo in 2023 with normal LVEF, and significant aortic stenosis. Patient with no chest pain, shortness of breath. Hemodynamically stable.  - Continue to monitor vitals and symptoms

## 2023-09-10 NOTE — Assessment & Plan Note (Signed)
Patient with LLE cellulitis of L ankle and shin with no induration, swelling, or drainage. Followed by Orthopedic Surgery outpatient as well. Afebrile, with no leukocytosis or sick symptoms.  - Antibiotic treatment as above - Follow up blood cultures - NGTD

## 2023-09-10 NOTE — Assessment & Plan Note (Signed)
Hgb stable. Patient with no signs/symptoms consistent with active bleed. Anemia workup consistent with iron deficiency anemia.  - Continue home ferrous sulfate 325 mg daily PO

## 2023-09-10 NOTE — Assessment & Plan Note (Signed)
Patient complaining of arthritic neck pain and R heel pain. 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 Home regimen is prescribed as Tylenol 1g TID and Norco q6h PRN, however, patient does not take Tylenol and takes Norco 10 q4h at home. Hemodynamically stable, resting in bed. Unfortunately pain not controlled, and we may be underdosing her from home regimen. - RESTART home Norco 10-325 Q6H and can further titrate depending on response with this and below interventions - STOP Oxycodone 7.5 mg q6h scheduled - SWITCH to Tylenol dosing 650 mg TID  - Cont Ibuprofen 600 mg TID - Cont Lidocaine patches daily

## 2023-09-10 NOTE — Progress Notes (Shared)
Daily Progress Note Intern Pager: [phone]  Patient name: Isabel Barnes Medical record number: [id number] Date of birth: [birth year] Age: 82 y.o. Gender: female  Primary Care Provider: Jamal Collin, PA-C Consultants: Ortho Code Status: DNR  Pt Overview and Major Events to Date:  10/09: Admitted to FMTS   Assessment and Plan: Isabel Barnes is a 82 yo female with past medical history of peripheral arterial disease, chronic venous insufficiency, CVA (remote R cerebellar infarct incidentally noted in 01/2022), HTN, hypothyroidism, and depression who presents here with chronic R heel ulcer, R leg wound, and LLE cellulitis.  Assessment & Plan RLE Wound  R Heel Ulcer Remains afebrile.  - Plan for angiogram Monday w/ [facility] for further assessment of PAD.   - NPO MN and holding lovenox, place SCDs until after angiogram - ID consulted and will likely see 10/14, appreciate recommendations  - Continue IV Abx with Vanc, Cefepime, and Flagyl LLE Cellulitis Remains afebrile. Bcx NGTD x3 days - Antibiotic treatment as above Peripheral arterial disease (HCC) - Cont ASA - Vascular Surgery consulted, appreciate recs Chronic pain disorder - Cont home Norco 10-325 Q6H and can further titrate depending on response with this and below interventions - Cont to Tylenol dosing 650 mg TID  - Cont Ibuprofen 600 mg TID - Cont Lidocaine patches daily  Anemia - Continue home ferrous sulfate 325 mg daily PO  Atrial fibrillation (HCC) HR wnl.  - Continue to monitor vitals and symptoms  Aortic valve stenosis - Continue to monitor vitals and symptoms Hypertension BP uptrending.  - Restart home losartan 25mg  daily   Chronic and Stable Problems:  Hypothyroidism: Continue home Synthroid 50 mcg GERD: Continue Pantoprazole 40 mg BID Insomnia: Continue Melatonin 3 mg nightly    FEN/GI: Regular. NPO @ MN PPx: Hold lovenox, start SCD's tonight  Dispo:Pending PT recommendations  pending  clinical improvement . Barriers include need for angiogram.   Subjective:  No acute complaints today.   Objective: Temp:  [98 F (36.7 C)-98.6 F (37 C)] 98 F (36.7 C) (10/12 2121) Pulse Rate:  [65-76] 67 (10/12 2121) Resp:  [17-19] 18 (10/12 2121) BP: (140-160)/(47-76) 141/66 (10/12 2121) SpO2:  [92 %-98 %] 98 % (10/12 2121) Physical Exam: General: Alert, pleasant older woman. NAD. Cardiovascular: 3/6 systolic murmur. RRR. Respiratory: CTAB. Normal WOB on RA. Abdomen: Soft, nontender, nondistended. Normal BS  Laboratory: Most recent CBC Lab Results  Component Value Date   WBC 4.0 09/10/2023   HGB 8.6 (L) 09/10/2023   HCT 26.6 (L) 09/10/2023   MCV 86.9 09/10/2023   PLT 231 09/10/2023   Most recent BMP    Latest Ref Rng & Units 09/10/2023    6:04 AM  BMP  Glucose 70 - 99 mg/dL 85   BUN 8 - 23 mg/dL 14   Creatinine 1.47 - 1.00 mg/dL 8.29   Sodium 562 - 130 mmol/L 139   Potassium 3.5 - 5.1 mmol/L 4.1   Chloride 98 - 111 mmol/L 109   CO2 22 - 32 mmol/L 24   Calcium 8.9 - 10.3 mg/dL 8.4      Isabel Brigham, MD 09/10/2023, 10:30 PM  PGY-2, [facility] FPTS Intern pager: [phone], text pages welcome Secure chat group [facility] Teaching Service

## 2023-09-10 NOTE — Assessment & Plan Note (Signed)
Remains afebrile.  - Plan for angiogram Monday w/ VVS for further assessment of PAD.   - NPO MN and holding lovenox, place SCDs until after angiogram - ID consulted and will likely see 10/14, appreciate recommendations  - Continue IV Abx with Vanc, Cefepime, and Flagyl

## 2023-09-10 NOTE — Assessment & Plan Note (Signed)
Cr stable around 0.8-0.9. Patient with adequate appetite and intake. Asymptomatic otherwise. AKI resolved.  - BMP in AM to monitor renal function - Encourage PO intake

## 2023-09-10 NOTE — Progress Notes (Addendum)
Daily Progress Note Intern Pager: [phone]  Patient name: Isabel Barnes Medical record number: [id number] Date of birth: [birth year] Age: 82 y.o. Gender: female  Primary Care Provider: Jamal Collin, PA-C Consultants: Orthopedic Surgery Code Status: DNR   Pt Overview and Major Events to Date:  10/09: Admitted to FMTS   Assessment and Plan:   Isabel Barnes is a 82 yo female with past medical history of peripheral arterial disease, chronic venous insufficiency, CVA (remote R cerebellar infarct incidentally noted in 01/2022), HTN, hypothyroidism, and depression who presents here with chronic R heel ulcer, R leg wound, and LLE cellulitis.  Assessment & Plan RLE Wound  R Heel Ulcer Patient with R shin wound and R heel ulcer ongoing for several months. Here for IV Abx after failed outpatient Abx course, on Vanc, Cefepime, Flagyl due to prior cultures with bacteroides. Improved pain of R heel today after wrapping and pressure relief. Afebrile with no leukocytosis or systemic symptoms. Hemodynamically stable. 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  - Orthopedic Surgery consulted, appreciate recommendations - No surgical intervention at this time after MRI, recommend continuing medical workup/treatment with primary team and VVS - Vascular Surgery consulted, appreciate recommendations - ABIs with noncompressible LLE arteries, abnormal right TBI though normal left TBI  - Aortogram with possible intervention 10/14 - ID consulted and will likely see 10/14, appreciate recommendations  - Continue IV Abx with Vanc, Cefepime, and Flagyl - WOC consulted  LLE Cellulitis Patient with LLE cellulitis of L ankle and shin with no induration, swelling, or drainage. Followed by Orthopedic Surgery outpatient as well. Afebrile, with no leukocytosis or sick symptoms.  - Antibiotic  treatment as above - Follow up blood cultures - NGTD Peripheral arterial disease (HCC) Patient with history of extensive peripheral arterial disease with chronic claudication symptoms, no worse here. Per review, last aortogram completed in 2023 with no vascular intervention at that time, but occlusions present on R side. Patient put on ASA and Crestor for PAD, has not been taking Crestor, intermittently takes ASA.  - Continue ASA 81 mg daily - Vascular Surgery consulted  - ABIs abnormal as above  - Aortogram 10/14 Chronic pain disorder Patient complaining of arthritic neck pain and R heel pain. 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 Home regimen is prescribed as Tylenol 1g TID and Norco q6h PRN, however, patient does not take Tylenol and takes Norco 10 q4h at home. Hemodynamically stable, resting in bed. Unfortunately pain not controlled, and we may be underdosing her from home regimen. - RESTART home Norco 10-325 Q6H and can further titrate depending on response with this and below interventions - STOP Oxycodone 7.5 mg q6h scheduled - SWITCH to Tylenol dosing 650 mg TID  - Cont Ibuprofen 600 mg TID - Cont Lidocaine patches daily  Anemia Hgb stable. Patient with no signs/symptoms consistent with active bleed. Anemia workup consistent with iron deficiency anemia.  - Continue home ferrous sulfate 325 mg daily PO  Atrial fibrillation (HCC) History of paroxysmal atrial fibrillation in 2023, previously on amiodarone 200 mg BID, patient reports this medication was discontinued months ago, she is unsure when/for what reason. No chest pain, palpitations, shortness of breath. Cardiopulmonary exam without evidence of Afib. Hemodynamically stable. EKG in NSR. - Continue to monitor vitals and symptoms  Aortic valve stenosis Patient with old systolic  murmur, previously documented. Echo in 2023 with normal LVEF, and significant aortic stenosis. Patient  with no chest pain, shortness of breath. Hemodynamically stable.  - Continue to monitor vitals and symptoms AKI (acute kidney injury) (HCC) (Resolved: 09/10/2023) Cr stable around 0.8-0.9. Patient with adequate appetite and intake. Asymptomatic otherwise. AKI resolved.  - BMP in AM to monitor renal function - Encourage PO intake  Chronic and Stable Conditions: HTN: Held home Losartan due to soft Bps, can restart as able Hypothyroidism: Continue home Synthroid 50 mcg GERD: Continue Pantoprazole 40 mg BID Insomnia: Continue Melatonin 3 mg nightly    FEN/GI: Regular diet, adding senokot daily to help with stools VTE Prophylaxis: Lovenox Dispo: Home pending clinical improvement  Subjective:  Remains in pain this morning.  Also feels a little nauseous but has not really stooled since she got here and feel that could be part of it.   Objective: Temp:  [97.3 F (36.3 C)-98 F (36.7 C)] 98 F (36.7 C) (10/12 0545) Pulse Rate:  [72-76] 72 (10/12 0545) Resp:  [18-19] 19 (10/12 0545) BP: (141-164)/(47-63) 141/47 (10/12 0545) SpO2:  [96 %-98 %] 96 % (10/12 0545) Physical Exam: General: Sitting up on side of bed, no acute distress Cardiovascular: Loud crescendo decrescendo murmur best heard over right upper sternal border, regular rate Respiratory: Clear to auscultation bilaterally Abdomen: Nondistended Extremities: Moves all extremities grossly equally, right lower extremity bandaged  Laboratory: Most recent CBC Lab Results  Component Value Date   WBC 4.0 09/10/2023   HGB 8.6 (L) 09/10/2023   HCT 26.6 (L) 09/10/2023   MCV 86.9 09/10/2023   PLT 231 09/10/2023   Most recent BMP    Latest Ref Rng & Units 09/10/2023    6:04 AM  BMP  Glucose 70 - 99 mg/dL 85   BUN 8 - 23 mg/dL 14   Creatinine 1.61 - 1.00 mg/dL 0.96   Sodium 045 - 409 mmol/L 139   Potassium 3.5 - 5.1 mmol/L 4.1   Chloride 98 - 111 mmol/L 109   CO2 22 - 32 mmol/L 24   Calcium 8.9 - 10.3 mg/dL 8.4    Evette Georges, MD 09/10/2023, 7:46 AM  PGY-2, [facility] FPTS Intern pager: [phone], text pages welcome Secure chat group [facility] Teaching Service

## 2023-09-11 DIAGNOSIS — L03119 Cellulitis of unspecified part of limb: Secondary | ICD-10-CM | POA: Diagnosis not present

## 2023-09-11 DIAGNOSIS — L97519 Non-pressure chronic ulcer of other part of right foot with unspecified severity: Secondary | ICD-10-CM | POA: Diagnosis not present

## 2023-09-11 LAB — BASIC METABOLIC PANEL
Anion gap: 7 (ref 5–15)
BUN: 14 mg/dL (ref 8–23)
CO2: 24 mmol/L (ref 22–32)
Calcium: 8.1 mg/dL — ABNORMAL LOW (ref 8.9–10.3)
Chloride: 109 mmol/L (ref 98–111)
Creatinine, Ser: 0.66 mg/dL (ref 0.44–1.00)
GFR, Estimated: >60 mL/min (ref >60)
Glucose, Bld: 86 mg/dL (ref 70–99)
Potassium: 3.9 mmol/L (ref 3.5–5.1)
Sodium: 140 mmol/L (ref 135–145)

## 2023-09-11 LAB — CBC
HCT: 26.8 % — ABNORMAL LOW (ref 36.0–46.0)
Hemoglobin: 8.6 g/dL — ABNORMAL LOW (ref 12.0–15.0)
MCH: 27.7 pg (ref 26.0–34.0)
MCHC: 32.1 g/dL (ref 30.0–36.0)
MCV: 86.5 fL (ref 80.0–100.0)
Platelets: 232 10*3/uL (ref 150–400)
RBC: 3.1 MIL/uL — ABNORMAL LOW (ref 3.87–5.11)
RDW: 14.6 % (ref 11.5–15.5)
WBC: 4.5 10*3/uL (ref 4.0–10.5)
nRBC: 0 % (ref 0.0–0.2)

## 2023-09-11 MED ORDER — LOSARTAN POTASSIUM 25 MG PO TABS
25.0000 mg | ORAL_TABLET | Freq: Every day | ORAL | Status: DC
  Administered 2023-09-11 – 2023-09-16 (×6): 25 mg via ORAL
  Filled 2023-09-11 (×5): qty 1

## 2023-09-11 NOTE — Progress Notes (Signed)
  Daily Progress Note  S/p:n/a  Subjective: No complaints today.   Objective: Vitals:   09/11/23 0603 09/11/23 0700  BP: (!) 148/57 (!) 135/51  Pulse: 70 71  Resp: 18 16  Temp: 98.6 F (37 C) 98.1 F (36.7 C)  SpO2: 97% 95%    Physical Examination HDS Normal work of breathing Dressing clean and dry  ASSESSMENT/PLAN:  Ms. 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  She also has a new right plantar heel wound.  ABI performed yesterday was noncompressible on the right with an ischemic toe pressure of 40 Plan for angiogram tomorrow with either myself or Dr. Randie Heinz Reviewed risks and benefits, she expressed understanding and is willing to proceed  -NPO midnight -Consent ordered    Daria Pastures MD MS [facility] [phone] 09/11/2023  8:38 AM

## 2023-09-11 NOTE — Assessment & Plan Note (Signed)
BP uptrending.  - Restart home losartan 25mg  daily

## 2023-09-12 ENCOUNTER — Encounter (HOSPITAL_COMMUNITY)
Admission: EM | Disposition: A | Discharge status: Home Health | Payer: Self-pay | Source: Home / Self Care | Attending: Family Medicine

## 2023-09-12 DIAGNOSIS — I70232 Atherosclerosis of native arteries of right leg with ulceration of calf: Secondary | ICD-10-CM

## 2023-09-12 DIAGNOSIS — L97514 Non-pressure chronic ulcer of other part of right foot with necrosis of bone: Secondary | ICD-10-CM | POA: Diagnosis not present

## 2023-09-12 DIAGNOSIS — L03119 Cellulitis of unspecified part of limb: Secondary | ICD-10-CM | POA: Diagnosis not present

## 2023-09-12 DIAGNOSIS — M86261 Subacute osteomyelitis, right tibia and fibula: Secondary | ICD-10-CM

## 2023-09-12 DIAGNOSIS — L97519 Non-pressure chronic ulcer of other part of right foot with unspecified severity: Secondary | ICD-10-CM | POA: Diagnosis not present

## 2023-09-12 DIAGNOSIS — M869 Osteomyelitis, unspecified: Secondary | ICD-10-CM | POA: Diagnosis present

## 2023-09-12 HISTORY — PX: ABDOMINAL AORTOGRAM W/LOWER EXTREMITY: CATH118223

## 2023-09-12 LAB — BASIC METABOLIC PANEL
Anion gap: 5 (ref 5–15)
BUN: 12 mg/dL (ref 8–23)
CO2: 24 mmol/L (ref 22–32)
Calcium: 8.4 mg/dL — ABNORMAL LOW (ref 8.9–10.3)
Chloride: 110 mmol/L (ref 98–111)
Creatinine, Ser: 0.57 mg/dL (ref 0.44–1.00)
GFR, Estimated: >60 mL/min (ref >60)
Glucose, Bld: 85 mg/dL (ref 70–99)
Potassium: 3.8 mmol/L (ref 3.5–5.1)
Sodium: 139 mmol/L (ref 135–145)

## 2023-09-12 LAB — CBC
HCT: 28.9 % — ABNORMAL LOW (ref 36.0–46.0)
Hemoglobin: 9.5 g/dL — ABNORMAL LOW (ref 12.0–15.0)
MCH: 28.7 pg (ref 26.0–34.0)
MCHC: 32.9 g/dL (ref 30.0–36.0)
MCV: 87.3 fL (ref 80.0–100.0)
Platelets: 244 10*3/uL (ref 150–400)
RBC: 3.31 MIL/uL — ABNORMAL LOW (ref 3.87–5.11)
RDW: 14.8 % (ref 11.5–15.5)
WBC: 5.6 10*3/uL (ref 4.0–10.5)
nRBC: 0 % (ref 0.0–0.2)

## 2023-09-12 LAB — CULTURE, BLOOD (ROUTINE X 2)
Culture: NO GROWTH
Culture: NO GROWTH
Special Requests: ADEQUATE
Special Requests: ADEQUATE

## 2023-09-12 SURGERY — ABDOMINAL AORTOGRAM W/LOWER EXTREMITY
Anesthesia: LOCAL

## 2023-09-12 MED ORDER — HYDRALAZINE HCL 20 MG/ML IJ SOLN: 5.0000 mg | INTRAMUSCULAR | Status: DC | PRN

## 2023-09-12 MED ORDER — FENTANYL CITRATE (PF) 100 MCG/2ML IJ SOLN
INTRAMUSCULAR | Status: AC
  Filled 2023-09-12: qty 2

## 2023-09-12 MED ORDER — MIDAZOLAM HCL 2 MG/2ML IJ SOLN
INTRAMUSCULAR | Status: DC | PRN
  Administered 2023-09-12: .5 mg via INTRAVENOUS

## 2023-09-12 MED ORDER — LABETALOL HCL 5 MG/ML IV SOLN: 10.0000 mg | INTRAVENOUS | Status: DC | PRN

## 2023-09-12 MED ORDER — FENTANYL CITRATE (PF) 100 MCG/2ML IJ SOLN
INTRAMUSCULAR | Status: DC | PRN
  Administered 2023-09-12: 25 ug via INTRAVENOUS

## 2023-09-12 MED ORDER — HYDROCODONE-ACETAMINOPHEN 5-325 MG PO TABS
ORAL_TABLET | ORAL | Status: AC
  Filled 2023-09-12: qty 1

## 2023-09-12 MED ORDER — SODIUM CHLORIDE 0.9 % IV SOLN: 250.0000 mL | INTRAVENOUS | Status: AC | PRN

## 2023-09-12 MED ORDER — ACETAMINOPHEN 325 MG PO TABS: 650.0000 mg | ORAL_TABLET | ORAL | Status: DC | PRN

## 2023-09-12 MED ORDER — SODIUM CHLORIDE 0.9% FLUSH: 3.0000 mL | INTRAVENOUS | Status: DC | PRN

## 2023-09-12 MED ORDER — HEPARIN SODIUM (PORCINE) 5000 UNIT/ML IJ SOLN
5000.0000 [IU] | Freq: Three times a day (TID) | INTRAMUSCULAR | Status: DC
  Administered 2023-09-12 – 2023-09-16 (×11): 5000 [IU] via SUBCUTANEOUS
  Filled 2023-09-12 (×11): qty 1

## 2023-09-12 MED ORDER — METHYLPREDNISOLONE SODIUM SUCC 125 MG IJ SOLR
125.0000 mg | Freq: Once | INTRAMUSCULAR | Status: AC
  Administered 2023-09-12: 125 mg via INTRAVENOUS
  Filled 2023-09-12: qty 2

## 2023-09-12 MED ORDER — SODIUM CHLORIDE 0.9 % IV SOLN: INTRAVENOUS | Status: AC

## 2023-09-12 MED ORDER — ONDANSETRON HCL 4 MG/2ML IJ SOLN
4.0000 mg | Freq: Four times a day (QID) | INTRAMUSCULAR | Status: DC | PRN
  Administered 2023-09-14 – 2023-09-16 (×2): 4 mg via INTRAVENOUS
  Filled 2023-09-12 (×2): qty 2

## 2023-09-12 MED ORDER — DIPHENHYDRAMINE HCL 50 MG/ML IJ SOLN
25.0000 mg | Freq: Once | INTRAMUSCULAR | Status: AC
  Administered 2023-09-12: 25 mg via INTRAVENOUS
  Filled 2023-09-12: qty 1

## 2023-09-12 MED ORDER — LIDOCAINE HCL (PF) 1 % IJ SOLN
INTRAMUSCULAR | Status: AC
  Filled 2023-09-12: qty 30

## 2023-09-12 MED ORDER — SODIUM CHLORIDE 0.9% FLUSH
3.0000 mL | Freq: Two times a day (BID) | INTRAVENOUS | Status: DC
  Administered 2023-09-12 – 2023-09-16 (×5): 3 mL via INTRAVENOUS

## 2023-09-12 MED ORDER — SODIUM CHLORIDE 0.9 % WEIGHT BASED INFUSION
1.0000 mL/kg/h | INTRAVENOUS | Status: AC
  Administered 2023-09-12: 1 mL/kg/h via INTRAVENOUS

## 2023-09-12 MED ORDER — MIDAZOLAM HCL 2 MG/2ML IJ SOLN
INTRAMUSCULAR | Status: AC
  Filled 2023-09-12: qty 2

## 2023-09-12 SURGICAL SUPPLY — 11 items
CATH OMNI FLUSH 5F 65CM (CATHETERS) IMPLANT
DEVICE CLOSURE MYNXGRIP 5F (Vascular Products) IMPLANT
GLIDEWIRE ADV .035X260CM (WIRE) IMPLANT
KIT MICROPUNCTURE NIT STIFF (SHEATH) IMPLANT
KIT SYRINGE INJ CVI SPIKEX1 (MISCELLANEOUS) IMPLANT
SET ATX-X65L (MISCELLANEOUS) IMPLANT
SHEATH PINNACLE 5F 10CM (SHEATH) IMPLANT
SHEATH PROBE COVER 6X72 (BAG) IMPLANT
TRANSDUCER W/STOPCOCK (MISCELLANEOUS) ×1 IMPLANT
TRAY PV CATH (CUSTOM PROCEDURE TRAY) ×1 IMPLANT
WIRE BENTSON .035X145CM (WIRE) IMPLANT

## 2023-09-12 NOTE — Progress Notes (Addendum)
Daily Progress Note Intern Pager: [phone]  Patient name: Isabel Barnes Medical record number: [id number] Date of birth: 1941/08/14 Age: 82 y.o. Gender: female  Primary Care Provider: Jamal Collin, PA-C Consultants: Orthopedic surgery Code Status: DNR  Pt Overview and Major Events to Date:  10/09: Admitted to FMTS 10/14: Angiogram   Assessment and Plan: Isabel Barnes is a 82 yo female with past medical history of peripheral arterial disease, chronic venous insufficiency, CVA (remote R cerebellar infarct incidentally noted in 01/2022), HTN, hypothyroidism, and depression who presented here with chronic R heel ulcer, R leg wound, and LLE cellulitis.   Patient underwent angiogram this morning with vascular surgery revealing grossly patent vessels.  No intervention was undertaken.   Assessment & Plan RLE Wound  R Heel Ulcer Remains afebrile.  ABI was noncompressible on the right with an ischemic toe pressure of 40.  Angiogram today for further assessment of PAD; currently n.p.o. and Lovenox held.  Will restart diet and place SCDs after procedure. - Consult ID, appreciate recommendations. - Continue IV Abx with Vanc, Cefepime, and Flagyl LLE Cellulitis Remains afebrile. Bcx NGTD x4 days. - Antibiotic treatment as above   Chronic and Stable Conditions: HTN: Holding home Losartan due to soft BPs Hypothyroidism: Continue home Synthroid 50 mcg GERD: Continue Pantoprazole 40 mg BID Insomnia: Continue Melatonin 3 mg nightly  Peripheral arterial disease: Continue ASA Chronic pain disorder: Continue home Norco 10-3 25 every 6 hours and titrate as needed.  Tylenol 650 mg 3 times daily, ibuprofen 600 mg 3 times daily, lidocaine patches daily. Atrial fibrillation: Heart rate continues to be within normal limits.  Continue to monitor vitals and symptoms. Hypertension: Continue home losartan 25 mg daily. Anemia: Continue home ferrous sulfate 325 mg daily Aortic valve  stenosis   FEN/GI: NPO currently for angiogram, restart Regular diet afterwards PPx: Held for angiogram.  Place SCDs after procedure, start heparin Dispo:Home pending clinical improvement . Awaiting angiogram today and further vascular surgery recs.  Subjective:  Patient seen this afternoon after arteriogram procedure.  She is accompanied by her friend today.  Somewhat tearful as she has just been told that she may require an AKA.  She understands why this may be necessary, but she is concerned about how will impact her day-to-day life.  She denies any discomfort related to arteriogram.  Objective: Temp:  [97.5 F (36.4 C)-98.8 F (37.1 C)] 97.5 F (36.4 C) (10/14 1321) Pulse Rate:  [63-91] 74 (10/14 1321) Resp:  [12-25] 21 (10/14 1321) BP: (127-182)/(59-126) 151/80 (10/14 1321) SpO2:  [94 %-100 %] 100 % (10/14 1321) Physical Exam: General: Alert, somewhat tearful, no acute distress. Cardiovascular: RRR.  4/6 systolic murmur. Respiratory: CTA bilaterally.  Normal work of breathing on room air. Abdomen: Soft, nontender, nondistended. Extremities: Right lower leg wrapped in Ace bandage, left foot and ankle also wrapped in Ace bandage.  Left leg with SCD in place.  Laboratory: Most recent CBC Lab Results  Component Value Date   WBC 5.6 09/12/2023   HGB 9.5 (L) 09/12/2023   HCT 28.9 (L) 09/12/2023   MCV 87.3 09/12/2023   PLT 244 09/12/2023   Most recent BMP    Latest Ref Rng & Units 09/12/2023    8:03 AM  BMP  Glucose 70 - 99 mg/dL 85   BUN 8 - 23 mg/dL 12   Creatinine 1.47 - 1.00 mg/dL 8.29   Sodium 562 - 130 mmol/L 139   Potassium 3.5 - 5.1 mmol/L 3.8  Chloride 98 - 111 mmol/L 110   CO2 22 - 32 mmol/L 24   Calcium 8.9 - 10.3 mg/dL 8.4     Isabel Skeeters, DO 09/12/2023, 3:31 PM  PGY-1, [facility] FPTS Intern pager: [phone], text pages welcome Secure chat group [facility] Teaching Service

## 2023-09-12 NOTE — Progress Notes (Signed)
Signed      OT Cancellation Note   Patient Details Name: Shonna Deiter MRN: [id number] DOB: [birth year]     Cancelled Treatment:    Reason Eval/Treat Not Completed: Patient not in room. Patient to have angiogram completed today.  OT will re-attempt as able   Denice Paradise 09/12/2023, 11:14AM

## 2023-09-12 NOTE — Consult Note (Signed)
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 Lajoyce Corners to see the patient --see discussion below. If this is truly tibial osteomyelitis and abnormalities are seen even on plain films she would need a BKA or possibly AKA depending on where clean margins could be obtained and where she has sufficient blood flow She is tolerating the antibiotics she is currently on and OK to continue them for now If she does not opt for curative surgery--or such surgery is not possible we could certainly give her po or IV antibiotics to try to keep this infection "at bay" and slow down progression of her osteomyelitis and potential spread to the bloodstream and infection of distant organs--though I do not like using IV antibiotics in someone in whom I am not going to cure her and he was not acutely ill my preference in this situation would typically to trial oral empiric antibiotics but she is also I understand been having trouble tolerating orals recently.  Principal Problem:   RLE Wound  R Heel Ulcer Active Problems:   Chronic pain disorder   Hypertension   Aortic valve stenosis   Anemia   Atrial fibrillation (HCC)   Peripheral arterial disease (HCC)   LLE Cellulitis   Scheduled Meds:  acetaminophen  650 mg Oral TID   aspirin EC  81 mg Oral Daily   cycloSPORINE  1 drop Both Eyes Daily   heparin  5,000 Units Subcutaneous Q8H   ibuprofen  600 mg Oral TID   levothyroxine  100 mcg Oral q morning   lidocaine  1 patch Transdermal Q24H   loratadine  10 mg Oral Daily   losartan  25 mg Oral Daily   melatonin  3 mg Oral QPM   multivitamin with minerals   Oral Daily   pantoprazole  40 mg Oral BID   polyethylene glycol  17 g Oral Daily    senna-docusate  1 tablet Oral QHS   silver sulfADIAZINE   Topical Daily   sodium chloride flush  3 mL Intravenous Q12H   Continuous Infusions:  sodium chloride     sodium chloride 1 mL/kg/hr (09/12/23 0953)   ceFEPime (MAXIPIME) IV 2 g (09/12/23 0114)   metronidazole 500 mg (09/12/23 0417)   vancomycin 750 mg (09/12/23 0558)   PRN Meds:.sodium chloride, acetaminophen, fluticasone, hydrALAZINE, HYDROcodone-acetaminophen, labetalol, ondansetron (ZOFRAN) IV, promethazine, sodium chloride flush  HPI: LOWELL MCGURK is a 82 y.o. female who has enclosing spondylitis hypertension hyperlipidemia aortic valve stenosis atrial fibrillation peripheral vascular disease who developed a wound on her heel and calf and the been following closely with Dr. Lajoyce Corners.  Had local debridement she has been on different courses of cefadroxil.  She was seen at [facility] and their PA, Jesse [place] has seen the patient this admission.  He had apparently in fact presented to their office though I initially misinterpreted the note as being that she was seen by wound care.  She was having increasing pain in her foot and having difficulty tolerating the cefadroxil that she was prescribed.  She apparently had difficult to palpate pulses and she was directed to be seen in ER and admitted to the hospital  Films shows  cortical thickening in the mid aspect of the tibia at the level of the wound concerning for osteomyelitis.  MRI of the tibia and fib on the right shows smooth cortical thickening from the anterior medial tibial diaphysis in the region of the mid calf and a wound with loss of soft tisuse that is 7.5x 13.5 cm  Blood cultures taken of it on admission have not grown any organisms she has been on vancomycin cefepime and metronidazole.  She has undergone arteriography with vascular surgery and there are no targets for them to intervene upon.  Been consulted with regards to management of her  osteomyelitis.  Certainly osteomyelitis in this region the patient with severe provide peripheral vascular disease is not curable with antibiotics.  A cure would involve an amputation either below the knee or above the knee amputation.  Splane this to the patient as well as her friend it was at the bedside.  Patient herself was naturally upset to hear that a cure would require an amputation.  I told her that she did he be evaluated by orthopedic surgery so they could go over the details of what surgical options that would be for a cure.  I also told her that I am certainly happy to give her antibiotics to try to keep her infection under control but that this would certainly not cure her infection   When the patient told me that she had been followed by Dr. Lajoyce Corners and also Dr. Susa Simmonds I made mistaken read that it was not Dr. [facility] clinic that had seen the patient and that it was a wound clinic so I reached out to Dr Susa Simmonds and then to Dr. Lajoyce Corners.  The consensus talking to Dr. Susa Simmonds and Dr. Lajoyce Corners was that best option would be for Dr. Lajoyce Corners to see her and is indeed doing so shortly.  I have personally spent 86 minutes involved in face-to-face and non-face-to-face activities for this patient on the day of the visit. Professional time spent includes the following activities: Preparing to see the patient (review of tests), Obtaining and/or reviewing separately obtained history (admission/discharge record), Performing a medically appropriate examination and/or evaluation , Ordering medications/tests/procedures, referring and communicating with other health care professionals, Documenting clinical information in the EMR, Independently interpreting results (not separately reported), Communicating results to the patient/family/caregiver, Counseling and educating the patient/family/caregiver and Care coordination (not separately reported).       Review of Systems: Review of Systems  Constitutional:  Negative  for chills, diaphoresis, fever, malaise/fatigue and weight loss.  HENT:  Negative for congestion, hearing loss, sore throat and tinnitus.   Eyes:  Negative for blurred vision and double vision.  Respiratory:  Negative for cough, sputum production, shortness of breath and wheezing.   Cardiovascular:  Negative for chest pain, palpitations and leg swelling.  Gastrointestinal:  Negative for abdominal pain, blood in stool, constipation, diarrhea, heartburn, melena, nausea and vomiting.  Genitourinary:  Negative for dysuria, flank pain and hematuria.  Musculoskeletal:  Negative for back pain, falls, joint pain and myalgias.  Skin:  Negative for itching and rash.  Neurological:  Negative for dizziness, sensory change, focal weakness, loss of consciousness, weakness and headaches.  Endo/Heme/Allergies:  Does not bruise/bleed easily.  Psychiatric/Behavioral:  Negative for depression, memory loss and suicidal ideas. The patient is nervous/anxious.     Past Medical History:  Diagnosis Date   AKI (acute kidney injury) (HCC) 09/08/2023   Anemia    iron deficiency hx.has had iron infusions before  Chronic low back pain    Chronic pain disorder    Complication of anesthesia    severe claustrophobia   Constipation    r/t use of pain meds.Takes OTC meds or eats prunes   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   Depression    GERD (gastroesophageal reflux disease)    takes Omeprazole daily   Heart murmur    mild MS, moderate-severe AS 03/17/21 echo   History of bronchitis    20+ yrs ago   History of kidney stones    3 surgerical removed, 1 passed   History of prolapse of bladder    History of shingles    Hypertension    takes Losartan daily   Hypothyroidism    takes Synthroid daily   Joint swelling    Neck pain    bone spurs at base of head per pt   Osteoarthritis    lumbar,cervical,joints   Pneumonia    hx of > 20 yrs ago    Shortness of breath    occasionally and with exertion. 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HCC)    Unsteady gait    occasionally   Urinary urgency     Social History   Tobacco Use   Smoking status: Former    Current packs/day: 0.00    Average packs/day: 0.5 packs/day for 2.0 years (1.0 ttl pk-yrs)    Types: Cigarettes    Start date: 02/06/1990    Quit date: 02/07/1992    Years since quitting: 31.6   Smokeless tobacco: Never  Vaping Use   Vaping status: Never Used  Substance Use Topics   Alcohol use: No   Drug use: No    Family History  Problem Relation Age of Onset   Stroke Father    Allergies  Allergen Reactions   Dilaudid [Hydromorphone Hcl] Shortness Of Breath   Gabapentin Other (See Comments)    Hoarseness , headache and sore throat   Latex Rash    Severe rash   Lyrica [Pregabalin] Other (See Comments)    No balance , had to walk with cane , Blurred vision,weakness.   Oxycodone Shortness Of Breath and Other (See Comments)    Tolerated oxycodone during admission to hospital 02/06/22   Singulair [Montelukast] Shortness Of Breath and Other (See Comments)    Vision issues, also   Ciprofloxacin Hcl Nausea And Vomiting and Other (See Comments)    Nausea and vomiting with by mouth form   Codeine Other (See Comments)    Hallucinations   Methadone Nausea And Vomiting and Other (See Comments)    Severe nausea and vomiting   Metronidazole Nausea And Vomiting and Other (See Comments)    Gastric pain   Oysters [Shellfish Allergy] Other (See Comments)    "Terrible gastric upset and cramping."   Clindamycin/Lincomycin Diarrhea and Nausea Only   Donepezil Diarrhea and Other (See Comments)    Severe diarrhea   Sulfa Antibiotics Diarrhea and Other (See Comments)    GI issues    Tape Other (See Comments)    ADHESIVE TAPE-Severe rash   Zetia [Ezetimibe] Diarrhea   Elemental Sulfur Nausea And Vomiting   Iodine Rash    Other Rash    All Antibiotic ointments/ creams   Oyster Shell Rash   Penicillin G Nausea And Vomiting and Rash   Penicillins Nausea And Vomiting and Rash  Has patient had a PCN reaction causing immediate rash, facial/tongue/throat swelling, SOB or lightheadedness with hypotension: Yes Has patient had a PCN reaction causing severe rash involving mucus membranes or skin necrosis: No Has patient had a PCN reaction that required hospitalization No Has patient had a PCN reaction occurring within the last 10 years: No If all of the above answers are "NO", then may proceed with Cephalosporin use.    Povidone Iodine Rash and Other (See Comments)    Oyster shell products- Rash    Skintegrity Hydrogel [Skin Protectants, Misc.] Rash   Tapentadol Other (See Comments)    Nightmares **Nucynta**    OBJECTIVE: Blood pressure (!) 151/80, pulse 74, temperature (!) 97.5 F (36.4 C), temperature source Oral, resp. rate (!) 21, height 5' (1.524 m), weight 55.3 kg, SpO2 100%.  Physical Exam Constitutional:      General: She is not in acute distress.    Appearance: Normal appearance. She is well-developed. She is not ill-appearing or diaphoretic.  HENT:     Head: Normocephalic and atraumatic.     Right Ear: Hearing and external ear normal.     Left Ear: Hearing and external ear normal.     Nose: No nasal deformity or rhinorrhea.  Eyes:     General: No scleral icterus.    Conjunctiva/sclera: Conjunctivae normal.     Right eye: Right conjunctiva is not injected.     Left eye: Left conjunctiva is not injected.     Pupils: Pupils are equal, round, and reactive to light.  Neck:     Vascular: No JVD.  Cardiovascular:     Rate and Rhythm: Normal rate and regular rhythm.     Heart sounds: S1 normal and S2 normal.  Pulmonary:     Effort: Pulmonary effort is normal. No respiratory distress.     Breath sounds: No wheezing.  Abdominal:     General: Bowel sounds are normal. There is no distension.      Palpations: Abdomen is soft.     Tenderness: There is no abdominal tenderness.  Musculoskeletal:        General: Normal range of motion.     Right shoulder: Normal.     Left shoulder: Normal.     Cervical back: Normal range of motion and neck supple.     Right hip: Normal.     Left hip: Normal.     Right knee: Normal.     Left knee: Normal.  Lymphadenopathy:     Head:     Right side of head: No submandibular, preauricular or posterior auricular adenopathy.     Left side of head: No submandibular, preauricular or posterior auricular adenopathy.     Cervical: No cervical adenopathy.     Right cervical: No superficial or deep cervical adenopathy.    Left cervical: No superficial or deep cervical adenopathy.  Skin:    General: Skin is warm and dry.     Coloration: Skin is not pale.     Findings: No abrasion, bruising, ecchymosis, erythema, lesion or rash.     Nails: There is no clubbing.  Neurological:     Mental Status: She is alert and oriented to person, place, and time.     Sensory: No sensory deficit.     Coordination: Coordination normal.     Gait: Gait normal.  Psychiatric:        Attention and Perception: She is attentive.        Mood and Affect: Mood normal. Affect  is tearful.        Speech: Speech normal.        Behavior: Behavior normal. Behavior is cooperative.        Thought Content: Thought content normal.        Judgment: Judgment normal.     Comments: Tearful only when hearing of need for amputation     Lab Results Lab Results  Component Value Date   WBC 5.6 09/12/2023   HGB 9.5 (L) 09/12/2023   HCT 28.9 (L) 09/12/2023   MCV 87.3 09/12/2023   PLT 244 09/12/2023    Lab Results  Component Value Date   CREATININE 0.57 09/12/2023   BUN 12 09/12/2023   NA 139 09/12/2023   K 3.8 09/12/2023   CL 110 09/12/2023   CO2 24 09/12/2023    Lab Results  Component Value Date   ALT 29 09/07/2023   AST 30 09/07/2023   ALKPHOS 103 09/07/2023   BILITOT 0.6  09/07/2023     Microbiology: Recent Results (from the past 240 hour(s))  Culture, blood (Routine x 2)     Status: None   Collection Time: 09/07/23  1:56 PM   Specimen: BLOOD  Result Value Ref Range Status   Specimen Description BLOOD LEFT ANTECUBITAL  Final   Special Requests   Final    BOTTLES DRAWN AEROBIC AND ANAEROBIC Blood Culture adequate volume   Culture   Final    NO GROWTH 5 DAYS Performed at [facility], [address] [place], [place] [address]    Report Status 09/12/2023 FINAL  Final  Culture, blood (Routine x 2)     Status: None   Collection Time: 09/07/23  2:01 PM   Specimen: BLOOD  Result Value Ref Range Status   Specimen Description BLOOD LEFT ANTECUBITAL  Final   Special Requests   Final    BOTTLES DRAWN AEROBIC ONLY Blood Culture adequate volume   Culture   Final    NO GROWTH 5 DAYS Performed at [facility], [address], [place], [place] [address]    Report Status 09/12/2023 FINAL  Final    Acey Lav, MD [facility] for Infectious Disease [facility] [phone] pager  09/12/2023, 3:15 PM

## 2023-09-12 NOTE — Assessment & Plan Note (Signed)
Remains afebrile. Bcx NGTD x4 days. - Antibiotic treatment as above

## 2023-09-12 NOTE — Care Management Important Message (Signed)
Important Message  Patient Details  Name: Isabel Barnes MRN: [id number] Date of Birth: [birth year]   Important Message Given:  Yes - Medicare IM     Dorena Bodo 09/12/2023, 3:20 PM

## 2023-09-12 NOTE — Assessment & Plan Note (Addendum)
Remains afebrile.  ABI was noncompressible on the right with an ischemic toe pressure of 40.  Angiogram today for further assessment of PAD; currently n.p.o. and Lovenox held.  Will restart diet and place SCDs after procedure. - Consult ID, appreciate recommendations. - Continue IV Abx with Vanc, Cefepime, and Flagyl

## 2023-09-12 NOTE — Consult Note (Signed)
ORTHOPAEDIC CONSULTATION  REQUESTING PHYSICIAN: Doreene Eland, MD  Chief Complaint: Worsening ulceration with purulent drainage medial right calf.  HPI: Isabel Barnes is a 82 y.o. female who presents with purulent drainage from the wound medial aspect of her right calf.  Patient has undergone prolonged conservative therapy for the chronic venous and lymphatic insufficiency ulcers.  She has undergone tissue graft compression wraps and antibiotics.  Patient recently has developed an ulcer on the right heel which she states is still tender to palpation.  Patient has tried pressure offloading with wearing a PRAFO boot but she states this was too heavy for her to ambulate.  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HCC)    Unsteady gait    occasionally   Urinary urgency    Past Surgical History:  Procedure Laterality Date    ABDOMINAL AORTOGRAM W/LOWER EXTREMITY N/A 02/15/2022   Procedure: ABDOMINAL AORTOGRAM W/LOWER EXTREMITY;  Surgeon: Maeola Harman, MD;  Location: [facility] INVASIVE CV LAB;  Service: Cardiovascular;  Laterality: N/A;   ABDOMINAL HYSTERECTOMY     ANTERIOR FUSION CERVICAL SPINE     x2 -C4-7   APPLICATION OF WOUND VAC Right 03/12/2022   Procedure: APPLICATION OF WOUND VAC;  Surgeon: Nadara Mustard, MD;  Location: [facility] OR;  Service: Orthopedics;  Laterality: Right;   BUNIONECTOMY Bilateral    COLONOSCOPY     CYSTOSCOPY W/ URETEROSCOPY  2012   EYE SURGERY Bilateral    cataract /lens implant   HOLMIUM LASER APPLICATION Left 02/08/2013   Procedure: HOLMIUM LASER APPLICATION;  Surgeon: Anner Crete, MD;  Location: [facility];  Service: Urology;  Laterality: Left;   I & D EXTREMITY Right 02/10/2022   Procedure: DEBRIDEMENT RIGHT LEG ABSCESS;  Surgeon: Nadara Mustard, MD;  Location: [facility] OR;  Service: Orthopedics;  Laterality: Right;   I & D EXTREMITY Right 03/12/2022   Procedure: RIGHT LEG IRRIGATION AND DEBRIDEMENT EXTREMITY;  Surgeon: Nadara Mustard, MD;  Location: [facility] OR;  Service: Orthopedics;  Laterality: Right;   INSERTION OF MESH N/A 07/15/2014   Procedure: INSERTION OF MESH;  Surgeon: Salvatore Decent.  Gross, MD;  Location: [facility] OR;  Service: General;  Laterality: N/A;   JOINT REPLACEMENT Right 2012   shoulder   LAPAROSCOPIC CHOLECYSTECTOMY W/ CHOLANGIOGRAPHY  2012   Dr Magnus Ivan   NASAL SINUS SURGERY     OPEN REDUCTION INTERNAL FIXATION (ORIF) DISTAL RADIAL FRACTURE Left 03/20/2021   Procedure: OPEN REDUCTION INTERNAL FIXATION (ORIF) DISTAL RADIAL FRACTURE;  Surgeon: Teryl Lucy, MD;  Location: [facility] OR;  Service: Orthopedics;  Laterality: Left;   RADIOLOGY WITH ANESTHESIA N/A 05/09/2014   Procedure: ADULT SEDATION WITH ANESTHESIA/MRI CERVICAL SPINE WITHOUT CONTRAST;  Surgeon: Medication Radiologist, MD;  Location: [facility] OR;  Service: Radiology;  Laterality: N/A;  DR. 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   Procedure: RIGHT TOTAL KNEE ARTHROPLASTY;  Surgeon: Jodi Geralds, MD;  Location: [facility] OR;  Service: Orthopedics;  Laterality: Right;   UMBILICAL HERNIA REPAIR N/A 07/15/2014   Procedure: LAPAROSCOPIC UMBILICAL AND INFRAUMBILICAL HERNIA;  Surgeon: Ardeth Sportsman, MD;  Location: [facility] OR;  Service: General;  Laterality: N/A;   Social History   Socioeconomic History   Marital status: Divorced    Spouse name: Not on file   Number of children: Not on file   Years of education: Not on file   Highest education level: Not on file  Occupational History   Not on file  Tobacco Use   Smoking status: Former    Current packs/day: 0.00    Average packs/day: 0.5 packs/day for 2.0 years (1.0 ttl pk-yrs)    Types: Cigarettes    Start date: 02/06/1990    Quit date: 02/07/1992    Years since quitting: 31.6   Smokeless tobacco: Never  Vaping Use   Vaping status: Never Used  Substance and Sexual Activity   Alcohol use: No   Drug use: No   Sexual activity: Not on file  Other Topics Concern   Not on file  Social History Narrative   Not on file   Social Determinants of Health   Financial Resource Strain: Low Risk  (05/02/2023)   Received from [facility]   Overall Financial Resource Strain (CARDIA)    Difficulty of Paying Living Expenses: Not very hard  Food Insecurity: No Food Insecurity (05/02/2023)   Received from [facility]   Hunger Vital Sign    Worried About Running Out  of Food in the Last Year: Never true    Ran Out of Food in the Last Year: Never true  Transportation Needs: No Transportation Needs (05/02/2023)   Received from [facility] - Transportation    Lack of Transportation (Medical): No    Lack of Transportation (Non-Medical): No  Physical Activity: Unknown (05/02/2023)   Received from [facility]   Exercise Vital Sign    Days of Exercise per Week: 0 days    Minutes of Exercise per Session: Not on file  Stress: No Stress Concern Present (05/02/2023)   Received from [facility]  [facility] - Occupational Stress Questionnaire    Feeling of Stress : Only a little  Social Connections: Moderately Integrated (05/02/2023)   Received from [facility]   Social Network    How would you rate your social network (family, work, friends)?: Adequate participation with social networks   Family History  Problem Relation Age of Onset   Stroke Father    - negative except otherwise stated in the family history section Allergies  Allergen Reactions   Dilaudid [Hydromorphone Hcl] Shortness Of Breath   Gabapentin Other (See Comments)    Hoarseness , headache and sore throat   Latex Rash    Severe rash   Lyrica [Pregabalin] Other (See Comments)    No balance , had to walk with cane , Blurred vision,weakness.   Oxycodone Shortness Of Breath and Other (See Comments)    Tolerated oxycodone during admission to hospital 02/06/22   Singulair [Montelukast] Shortness Of Breath and Other (See Comments)    Vision issues, also   Ciprofloxacin Hcl Nausea And Vomiting and Other (See Comments)    Nausea and vomiting with by mouth form   Codeine Other (See Comments)    Hallucinations   Methadone Nausea And Vomiting and Other (See Comments)    Severe nausea and vomiting   Metronidazole Nausea And Vomiting and Other (See Comments)    Gastric pain   Oysters [Shellfish Allergy] Other (See Comments)    "Terrible gastric upset and  cramping."   Clindamycin/Lincomycin Diarrhea and Nausea Only   Donepezil Diarrhea and Other (See Comments)    Severe diarrhea   Sulfa Antibiotics Diarrhea and Other (See Comments)    GI issues    Tape Other (See Comments)    ADHESIVE TAPE-Severe rash   Zetia [Ezetimibe] Diarrhea   Elemental Sulfur Nausea And Vomiting   Iodine Rash   Other Rash    All Antibiotic ointments/ creams   Oyster Shell Rash   Penicillin G Nausea And Vomiting and Rash   Penicillins Nausea And Vomiting and Rash    Has patient had a PCN reaction causing immediate rash, facial/tongue/throat swelling, SOB or lightheadedness with hypotension: Yes Has patient had a PCN reaction causing severe rash involving mucus membranes or skin necrosis: No Has patient had a PCN reaction that required hospitalization No Has patient had a PCN reaction occurring within the last 10 years: No If all of the above answers are "NO", then may proceed with Cephalosporin use.    Povidone Iodine Rash and Other (See Comments)    Oyster shell products- Rash    Skintegrity Hydrogel [Skin Protectants, Misc.] Rash   Tapentadol Other (See Comments)    Nightmares **Nucynta**   Prior to Admission medications   Medication Sig Start Date End Date Taking? Authorizing Provider  acetaminophen (TYLENOL) 500 MG tablet Take 2 tablets (1,000 mg total) by mouth 3 (three) times daily. Patient taking differently: Take 1,000 mg by mouth daily as needed for moderate pain. 02/22/22  Yes Atway, Rayann N, DO  cholecalciferol (VITAMIN D) 1000 UNITS tablet Take 1,000 Units by mouth daily.   Yes   diclofenac sodium (VOLTAREN) 1 % GEL Apply 2 g topically daily as needed (for pain).   Yes [provider]  Digestive Enzymes (ENZYME DIGEST) CAPS Take 1 capsule by mouth daily as needed (For digestive). 03/23/22  Yes Medina-Vargas, Monina C, NP  ENSURE PLUS (ENSURE PLUS) LIQD Take 237 mLs by mouth daily.   Yes [provider]  fish oil-omega-3 fatty  acids 1000 MG capsule Take 1,000 mg by mouth 2 (two) times daily.    Yes [provider]  HYDROcodone-acetaminophen (NORCO) 10-325 MG tablet Take 1 tablet by mouth in the morning, at noon, and at bedtime.   Yes [provider]  hydrOXYzine (ATARAX) 25 MG tablet Take 25 mg by mouth every 8 (eight) hours as needed for anxiety. 11/18/22  Yes [provider]  Lactobacillus (PROBIOTIC ACIDOPHILUS PO) Take 1 capsule by mouth daily.   Yes [provider]  levothyroxine (SYNTHROID) 50 MCG tablet Take 1 tablet (50 mcg total) by mouth every morning. Patient taking differently: Take 25 mcg by mouth every morning. Taking this along with 75 mcg tablet to total 100 mcg 03/23/22  Yes Medina-Vargas, Monina C, NP  levothyroxine (SYNTHROID) 75 MCG tablet Take 75 mcg by mouth daily before breakfast. Taking this along with 25 mcg (1/2 of 50 mcg tablet) to total 100 mcg   Yes [provider]  loperamide (IMODIUM A-D) 2 MG tablet Take 4 mg by mouth 3 (three) times daily as needed for diarrhea or loose stools.   Yes [provider]  losartan (COZAAR) 25 MG tablet Take 1 tablet (25 mg total) by mouth daily. Patient taking differently: Take 100 mg by mouth daily. 03/23/22  Yes Medina-Vargas, Monina C, NP  methocarbamol (ROBAXIN) 500 MG tablet Take 500 mg by mouth every 8 (eight) hours as needed for muscle spasms. 12/01/22  Yes [provider]  Multiple Vitamins-Minerals (MULTIVITAMIN PO) Take 1 tablet by mouth daily.   Yes [provider]  omeprazole (PRILOSEC) 20 MG capsule Take 20 mg by mouth daily as needed (for acid reflux).   Yes [provider]  ondansetron (ZOFRAN) 8 MG tablet Take 1 tablet (8 mg total) by mouth every 8 (eight) hours as needed for nausea or vomiting. 03/23/22  Yes Medina-Vargas, Monina C, NP  prochlorperazine (COMPAZINE) 5 MG tablet Take 5 mg by mouth daily.   Yes [provider]  RESTASIS 0.05 % ophthalmic emulsion  Place 1 drop into both eyes daily. 03/23/22  Yes Medina-Vargas, Monina C, NP  sertraline (ZOLOFT) 50 MG tablet Take 50 mg by mouth daily as needed (depression, sleep). 09/20/22  Yes [provider]  silver sulfADIAZINE (SILVADENE) 1 % cream Apply 1 Application topically 2 (two) times daily. 12/14/22  Yes [provider]  tetrahydrozoline 0.05 % ophthalmic solution Place 1 drop into both eyes 4 (four) times daily.   Yes [provider]  triamcinolone (NASACORT) 55 MCG/ACT AERO nasal inhaler Place 1-2 sprays into the nose 2 (two) times daily as needed (for seasonal allergies). 03/23/22  Yes Medina-Vargas, Monina C, NP  Turmeric 500 MG CAPS Take 1 capsule by mouth at bedtime.   Yes [provider]  Vitamin D-Vitamin K (K2 PLUS D3) 4014013981 MCG-UNIT TABS Take 1 tablet by mouth daily.   Yes [provider]  carboxymethylcellulose (REFRESH PLUS) 0.5 % SOLN Place 1 drop into both eyes 3 (three) times daily as needed (Dry eyes). Patient not taking: Reported on 09/07/2023    [provider]  ferrous sulfate 325 (65 FE) MG tablet Take 1 tablet (325 mg total) by mouth daily with breakfast. Patient not taking: Reported on 09/07/2023 03/23/22   Medina-Vargas, Monina C, NP  melatonin 5 MG TABS Take 5 mg by mouth every evening. Patient not taking: Reported on 09/07/2023    [provider]  polyethylene glycol (MIRALAX / GLYCOLAX) 17 g packet Take 17 g by mouth  daily. Patient not taking: Reported on 09/07/2023 03/23/22   Medina-Vargas, Margit Banda, NP   PERIPHERAL VASCULAR CATHETERIZATION  Result Date: 09/12/2023 Images from the original result were not included.   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  Ultrasound-guided micropuncture access of the left common femoral artery in retrograde fashion 2.  Aortogram 3.  Dedicated angiography of the right common iliac, hypogastric, external neck artery 4.  Second-order cannulation, right lower extremity angiogram 5.  Moderate sedation time 18 minutes 6.  Contrast volume 100 mL 7.  Device assisted closure-Mynx   Indications: Patient is an 82 year old female with right lower extremity nonhealing wounds on the calves.  She had nonpalpable pulses in the right foot with toe pressure of 40 mmHg.  On physical exam, wounds appear to be venous, however being that they are nonhealing, and she has peripheral arterial disease, she would benefit from right lower extremity angiogram in effort to define and improve distal perfusion for wound healing.  After discussing risk and benefits, prescription elected to proceed.  Findings: Aortogram: Widely patent renal arteries.  No flow-limiting stenosis appreciated in the infrarenal aorta, bilateral iliac system On the right: Common femoral artery widely patent, profunda widely patent, superficial femoral artery widely patent.  There is mild disease in the popliteal artery.  Distally, single-vessel peroneal artery runoff filling the foot via medial and lateral perforators.  Reconstitution of the dorsalis pedis and plantar arteries.             Procedure:  The patient was identified in the holding area and taken to room 8.  The patient was then placed supine on the table and prepped and draped in the usual sterile fashion.  A time out was called.  Moderate sedation including fentanyl and Versed were administered.  Ultrasound was used to evaluate the left common femoral artery.  It was patent .  A digital ultrasound image was acquired.  A micropuncture needle was used to access the left common femoral artery under ultrasound guidance.  An 018 wire was advanced without resistance and a micropuncture sheath was placed.  The 018 wire was  removed and a benson wire was placed.  The micropuncture sheath was exchanged for a 5 french sheath.  An omniflush catheter was advanced over the wire to the level of L-1.  An abdominal angiogram was obtained.  Next, the Omni Flush catheter was brought to the aortic bifurcation and multiple views followed to ensure there was no stenosis as there was significant bowel gas.  Next, using the omniflush catheter and a benson wire, the aortic bifurcation was crossed and the catheter was placed into theright external iliac artery and right runoff was obtained.  No intervention was undertaken.  Impression: Normal blood flow to the level of the knee with single-vessel peroneal outflow filling plantar arteries, dorsalis pedis artery via medial and lateral perforators.    Victorino Sparrow MD [facility] [place] Office: [phone]    - pertinent xrays, CT, MRI studies were reviewed and independently interpreted  Positive ROS: All other systems have been reviewed and were otherwise negative with the exception of those  mentioned in the HPI and as above.  Physical Exam: General: Alert, no acute distress Psychiatric: Patient is competent for consent with normal mood and affect Lymphatic: No axillary or cervical lymphadenopathy Cardiovascular: No pedal edema Respiratory: No cyanosis, no use of accessory musculature GI: No organomegaly, abdomen is soft and non-tender    Images:  @ENCIMAGES @  Labs:  Lab Results  Component Value Date   ESRSEDRATE 40 (H) 02/07/2022   CRP 20.5 (H) 02/07/2022   REPTSTATUS 09/12/2023 FINAL 09/07/2023   GRAMSTAIN  02/10/2022    RARE WBC PRESENT, PREDOMINANTLY PMN NO ORGANISMS SEEN    CULT  09/07/2023    NO GROWTH 5 DAYS Performed at [facility], [address] [place], [place] [address]    [facility] ENTEROCOCCUS FAECALIS 02/10/2022    Lab Results  Component Value Date   ALBUMIN 3.5 09/07/2023   ALBUMIN 1.5 (L) 02/09/2022   ALBUMIN 2.1  (L) 02/06/2022        Latest Ref Rng & Units 09/12/2023    8:03 AM 09/11/2023    8:51 AM 09/10/2023    6:04 AM  CBC EXTENDED  WBC 4.0 - 10.5 K/uL 5.6  4.5  4.0   RBC 3.87 - 5.11 MIL/uL 3.31  3.10  3.06   Hemoglobin 12.0 - 15.0 g/dL 9.5  8.6  8.6   HCT 56.2 - 46.0 % 28.9  26.8  26.6   Platelets 150 - 400 K/uL 244  232  231     Neurologic: Patient does not have protective sensation bilateral lower extremities.   MUSCULOSKELETAL:   Skin: Examination patient has purulent drainage from the medial right calf wound.  This is a new change.  The wound is flat there is no tunneling.  There is no surrounding cellulitis.  Examination of the right heel there is a closed ulcer that is tender to palpation.  Patient underwent vascular studies which shows single-vessel runoff to the right lower extremity.  Review of the MRI scan shows ulceration that goes down to bone with edema in the bone consistent with osteomyelitis.  Review of the MRI scan of the right heel shows edema and early bony changes of the calcaneus consistent with early osteomyelitis of the right calcaneus.  Hemoglobin 9.5 with a white cell count of 5.6.  No new inflammatory markers.  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  Plan: Discussed with patient and her son on the phone that my recommendation would be to proceed with a right below-knee amputation.  Feel she should be able to be able to ambulate with a below-knee prosthesis.  Discussed that alternative treatment option would include prolonged IV antibiotics.  Discussed that there are risks including C. difficile with prolonged IV antibiotics and her initial treatment with 6 weeks of IV antibiotics.  Discussed that I and infectious disease would proceed as per their wishes.  They will call me if they have any questions.  Discussed that I could proceed with surgery Wednesday or Friday.  Thank you for the  consult and the opportunity to see Ms. Guinevere Ferrari, MD [facility] [phone] 5:37 PM

## 2023-09-12 NOTE — Progress Notes (Addendum)
  Progress Note    09/12/2023 7:46 AM * No surgery date entered *  Subjective:  no complaints    Vitals:   09/11/23 2056 09/12/23 0626  BP: (!) 127/90 (!) 166/80  Pulse: 79 68  Resp: 18 18  Temp: 98.8 F (37.1 C) 98.4 F (36.9 C)  SpO2: 98% 96%    Physical Exam: General:  laying in bed Cardiac:  regular Extremities:  RLE wrapped and dry  CBC    Component Value Date/Time   WBC 4.5 09/11/2023 0851   RBC 3.10 (L) 09/11/2023 0851   HGB 8.6 (L) 09/11/2023 0851   HGB 12.0 02/17/2016 0920   HCT 26.8 (L) 09/11/2023 0851   HCT 37.1 02/17/2016 0920   PLT 232 09/11/2023 0851   PLT 318 02/17/2016 0920   MCV 86.5 09/11/2023 0851   MCV 88.0 02/17/2016 0920   MCH 27.7 09/11/2023 0851   MCHC 32.1 09/11/2023 0851   RDW 14.6 09/11/2023 0851   RDW 15.0 (H) 02/17/2016 0920   LYMPHSABS 0.8 09/07/2023 1404   LYMPHSABS 1.6 02/17/2016 0920   MONOABS 0.5 09/07/2023 1404   MONOABS 0.6 02/17/2016 0920   EOSABS 0.6 (H) 09/07/2023 1404   EOSABS 0.9 (H) 02/17/2016 0920   BASOSABS 0.1 09/07/2023 1404   BASOSABS 0.2 (H) 02/17/2016 0920    BMET    Component Value Date/Time   NA 140 09/11/2023 0851   NA 133 (A) 03/16/2022 0000   NA 142 02/17/2016 0920   K 3.9 09/11/2023 0851   K 4.2 02/17/2016 0920   CL 109 09/11/2023 0851   CO2 24 09/11/2023 0851   CO2 26 02/17/2016 0920   GLUCOSE 86 09/11/2023 0851   GLUCOSE 93 02/17/2016 0920   BUN 14 09/11/2023 0851   BUN 9 03/16/2022 0000   BUN 13.3 02/17/2016 0920   CREATININE 0.66 09/11/2023 0851   CREATININE 0.7 02/17/2016 0920   CALCIUM 8.1 (L) 09/11/2023 0851   CALCIUM 8.8 02/17/2016 0920   GFRNONAA >60 09/11/2023 0851   GFRAA >60 06/25/2017 1506    INR    Component Value Date/Time   INR 1.0 09/07/2023 1404     Intake/Output Summary (Last 24 hours) at 09/12/2023 0746 Last data filed at 09/12/2023 0602 Gross per 24 hour  Intake 0 ml  Output 1000 ml  Net -1000 ml      Assessment/Plan:  82 y.o. female with RLE  wound    -No changes to RLE wound this morning. The leg is dressed and dry -She has been NPO past midnight. She is still agreeable to angio today -Will undergo RLE angiogram with possible intervention today with either Dr.Cain or Dr.Buckley   Loel Dubonnet, PA-C [facility] [phone] 09/12/2023 7:46 AM  VASCULAR STAFF ADDENDUM: I have independently interviewed and examined the patient. I agree with the above.  Plan for RLE angiogram today for critical limb ischemia with tissue loss in an effort to improve distal perfusion for wound healing   Victorino Sparrow MD [facility] Phone Number: [phone] 09/12/2023 8:39 AM

## 2023-09-12 NOTE — Op Note (Signed)
Patient name: Isabel Barnes MRN: [id number] DOB: Jan 07, 1941 Sex: female  09/07/2023 - 09/12/2023 Pre-operative Diagnosis: Right lower extremity critical and ischemia with tissue loss in the calf Post-operative diagnosis:  Same Surgeon:  Victorino Sparrow, MD Procedure Performed: 1.  Ultrasound-guided micropuncture access of the left common femoral artery in retrograde fashion 2.  Aortogram 3.  Dedicated angiography of the right common iliac, hypogastric, external neck artery 4.  Second-order cannulation, right lower extremity angiogram 5.  Moderate sedation time 18 minutes 6.  Contrast volume 100 mL 7.  Device assisted closure-Mynx   Indications: Patient is an 82 year old female with right lower extremity nonhealing wounds on the calves.  She had nonpalpable pulses in the right foot with toe pressure of 40 mmHg.  On physical exam, wounds appear to be venous, however being that they are nonhealing, and she has peripheral arterial disease, she would benefit from right lower extremity angiogram in effort to define and improve distal perfusion for wound healing.  After discussing risk and benefits, prescription elected to proceed.  Findings:  Aortogram: Widely patent renal arteries.  No flow-limiting stenosis appreciated in the infrarenal aorta, bilateral iliac system On the right: Common femoral artery widely patent, profunda widely patent, superficial femoral artery widely patent.  There is mild disease in the popliteal artery.  Distally, single-vessel peroneal artery runoff filling the foot via medial and lateral perforators.  Reconstitution of the dorsalis pedis and plantar arteries.   Procedure:  The patient was identified in the holding area and taken to room 8.  The patient was then placed supine on the table and prepped and draped in the usual sterile fashion.  A time out was called.  Moderate sedation including fentanyl and Versed were administered.  Ultrasound was used to evaluate the  left common femoral artery.  It was patent .  A digital ultrasound image was acquired.  A micropuncture needle was used to access the left common femoral artery under ultrasound guidance.  An 018 wire was advanced without resistance and a micropuncture sheath was placed.  The 018 wire was removed and a benson wire was placed.  The micropuncture sheath was exchanged for a 5 french sheath.  An omniflush catheter was advanced over the wire to the level of L-1.  An abdominal angiogram was obtained.  Next, the Omni Flush catheter was brought to the aortic bifurcation and multiple views followed to ensure there was no stenosis as there was significant bowel gas.  Next, using the omniflush catheter and a benson wire, the aortic bifurcation was crossed and the catheter was placed into theright external iliac artery and right runoff was obtained.  No intervention was undertaken.  Impression: Normal blood flow to the level of the knee with single-vessel peroneal outflow filling plantar arteries, dorsalis pedis artery via medial and lateral perforators.    Victorino Sparrow MD [facility] Office: [phone]

## 2023-09-13 ENCOUNTER — Encounter (HOSPITAL_COMMUNITY): Payer: Self-pay | Admitting: Vascular Surgery

## 2023-09-13 ENCOUNTER — Telehealth: Payer: Self-pay | Admitting: Orthopedic Surgery

## 2023-09-13 DIAGNOSIS — N179 Acute kidney failure, unspecified: Secondary | ICD-10-CM | POA: Diagnosis not present

## 2023-09-13 DIAGNOSIS — L97519 Non-pressure chronic ulcer of other part of right foot with unspecified severity: Secondary | ICD-10-CM | POA: Diagnosis not present

## 2023-09-13 DIAGNOSIS — L03119 Cellulitis of unspecified part of limb: Secondary | ICD-10-CM | POA: Diagnosis not present

## 2023-09-13 LAB — BASIC METABOLIC PANEL
Anion gap: 9 (ref 5–15)
BUN: 23 mg/dL (ref 8–23)
CO2: 23 mmol/L (ref 22–32)
Calcium: 8.4 mg/dL — ABNORMAL LOW (ref 8.9–10.3)
Chloride: 106 mmol/L (ref 98–111)
Creatinine, Ser: 0.73 mg/dL (ref 0.44–1.00)
GFR, Estimated: >60 mL/min (ref >60)
Glucose, Bld: 114 mg/dL — ABNORMAL HIGH (ref 70–99)
Potassium: 3.8 mmol/L (ref 3.5–5.1)
Sodium: 138 mmol/L (ref 135–145)

## 2023-09-13 LAB — CBC
HCT: 29 % — ABNORMAL LOW (ref 36.0–46.0)
Hemoglobin: 9.7 g/dL — ABNORMAL LOW (ref 12.0–15.0)
MCH: 28.9 pg (ref 26.0–34.0)
MCHC: 33.4 g/dL (ref 30.0–36.0)
MCV: 86.3 fL (ref 80.0–100.0)
Platelets: 264 10*3/uL (ref 150–400)
RBC: 3.36 MIL/uL — ABNORMAL LOW (ref 3.87–5.11)
RDW: 14.6 % (ref 11.5–15.5)
WBC: 8.6 10*3/uL (ref 4.0–10.5)
nRBC: 0 % (ref 0.0–0.2)

## 2023-09-13 LAB — LIPID PANEL
Cholesterol: 162 mg/dL (ref 0–200)
HDL: 50 mg/dL (ref >40)
LDL Cholesterol: 103 mg/dL — ABNORMAL HIGH (ref 0–99)
Total CHOL/HDL Ratio: 3.2 {ratio}
Triglycerides: 45 mg/dL (ref <150)
VLDL: 9 mg/dL (ref 0–40)

## 2023-09-13 MED ORDER — PRAVASTATIN SODIUM 40 MG PO TABS
40.0000 mg | ORAL_TABLET | Freq: Every day | ORAL | Status: DC
  Administered 2023-09-13 – 2023-09-15 (×3): 40 mg via ORAL
  Filled 2023-09-13 (×3): qty 1

## 2023-09-13 MED ORDER — OXYCODONE HCL 5 MG PO TABS
2.5000 mg | ORAL_TABLET | Freq: Once | ORAL | Status: AC
  Administered 2023-09-13: 2.5 mg via ORAL
  Filled 2023-09-13: qty 1

## 2023-09-13 MED ORDER — METRONIDAZOLE 500 MG PO TABS
500.0000 mg | ORAL_TABLET | Freq: Two times a day (BID) | ORAL | Status: DC
  Administered 2023-09-13 – 2023-09-14 (×2): 500 mg via ORAL
  Filled 2023-09-13 (×2): qty 1

## 2023-09-13 NOTE — Telephone Encounter (Signed)
Pt daughter called for medical advice involving pt amputation. Requesting a call from assistant to inform further important info.   Pt daughter is located in [place]: 6 hours ahead of our time. CB [phone]

## 2023-09-13 NOTE — Assessment & Plan Note (Deleted)
Patient complaining of arthritic neck pain and R heel pain. 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 Home regimen is prescribed as Tylenol 1g TID and Norco q6h PRN, however, patient does not take Tylenol and takes Norco 10 q4h at home. Hemodynamically stable, resting in bed. Unfortunately pain not controlled, and we may be underdosing her from home regimen. - RESTART home Norco 10-325 Q6H and can further titrate depending on response with this and below interventions - STOP Oxycodone 7.5 mg q6h scheduled - SWITCH to Tylenol dosing 650 mg TID  - Cont Ibuprofen 600 mg TID - Cont Lidocaine patches daily  Cont home Norco 10-325 Q6H and can further titrate depending on response with this and below interventions - Cont to Tylenol dosing 650 mg TID  - Cont Ibuprofen 600 mg TID - Cont Lidocaine patches daily

## 2023-09-13 NOTE — Assessment & Plan Note (Deleted)
-   Cont ASA - Vascular Surgery consulted, appreciate recs

## 2023-09-13 NOTE — Assessment & Plan Note (Deleted)
-   Continue home ferrous sulfate 325 mg daily PO

## 2023-09-13 NOTE — Progress Notes (Signed)
Subjective:  Patient contemplating below the knee amputation   Antibiotics:  Anti-infectives (From admission, onward)    Start     Dose/Rate Route Frequency Ordered Stop   09/09/23 0400  vancomycin (VANCOREADY) IVPB 750 mg/150 mL        750 mg 150 mL/hr over 60 Minutes Intravenous Every 24 hours 09/08/23 0943     09/08/23 1200  ceFEPIme (MAXIPIME) 2 g in sodium chloride 0.9 % 100 mL IVPB        2 g 200 mL/hr over 30 Minutes Intravenous Every 12 hours 09/08/23 1109     09/08/23 0945  metroNIDAZOLE (FLAGYL) IVPB 500 mg        500 mg 100 mL/hr over 60 Minutes Intravenous Every 12 hours 09/08/23 0939     09/07/23 2045  ceFEPIme (MAXIPIME) 2 g in sodium chloride 0.9 % 100 mL IVPB        2 g 200 mL/hr over 30 Minutes Intravenous  Once 09/07/23 2035 09/08/23 0102   09/07/23 2045  vancomycin (VANCOREADY) IVPB 1250 mg/250 mL        1,250 mg 166.7 mL/hr over 90 Minutes Intravenous  Once 09/07/23 2035 09/08/23 0505       Medications: Scheduled Meds:  acetaminophen  650 mg Oral TID   aspirin EC  81 mg Oral Daily   cycloSPORINE  1 drop Both Eyes Daily   heparin  5,000 Units Subcutaneous Q8H   ibuprofen  600 mg Oral TID   levothyroxine  100 mcg Oral q morning   lidocaine  1 patch Transdermal Q24H   loratadine  10 mg Oral Daily   losartan  25 mg Oral Daily   melatonin  3 mg Oral QPM   multivitamin with minerals   Oral Daily   pantoprazole  40 mg Oral BID   polyethylene glycol  17 g Oral Daily   pravastatin  40 mg Oral q1800   senna-docusate  1 tablet Oral QHS   silver sulfADIAZINE   Topical Daily   sodium chloride flush  3 mL Intravenous Q12H   Continuous Infusions:  sodium chloride     ceFEPime (MAXIPIME) IV 2 g (09/13/23 0019)   metronidazole 500 mg (09/13/23 2536)   vancomycin 750 mg (09/13/23 0742)   PRN Meds:.sodium chloride, acetaminophen, fluticasone, hydrALAZINE, HYDROcodone-acetaminophen, labetalol, ondansetron (ZOFRAN) IV, promethazine, sodium chloride  flush    Objective: Weight change:   Intake/Output Summary (Last 24 hours) at 09/13/2023 1440 Last data filed at 09/12/2023 2200 Gross per 24 hour  Intake 708.44 ml  Output --  Net 708.44 ml   Blood pressure (!) 132/57, pulse 83, temperature 97.7 F (36.5 C), temperature source Oral, resp. rate 15, height 5' (1.524 m), weight 55.3 kg, SpO2 100%. Temp:  [97.7 F (36.5 C)-98.6 F (37 C)] 97.7 F (36.5 C) (10/15 0842) Pulse Rate:  [70-84] 83 (10/15 0842) Resp:  [15-20] 15 (10/15 0842) BP: (123-178)/(53-93) 132/57 (10/15 0842) SpO2:  [95 %-100 %] 100 % (10/15 6440)  Physical Exam: Physical Exam Constitutional:      General: She is not in acute distress.    Appearance: She is well-developed. She is not diaphoretic.  HENT:     Head: Normocephalic and atraumatic.     Right Ear: External ear normal.     Left Ear: External ear normal.     Mouth/Throat:     Pharynx: No oropharyngeal exudate.  Eyes:     General: No scleral icterus.  Conjunctiva/sclera: Conjunctivae normal.     Pupils: Pupils are equal, round, and reactive to light.  Cardiovascular:     Rate and Rhythm: Normal rate and regular rhythm.  Pulmonary:     Effort: Pulmonary effort is normal. No respiratory distress.     Breath sounds: No wheezing.  Abdominal:     General: Bowel sounds are normal. There is no distension.     Palpations: Abdomen is soft.     Tenderness: There is no abdominal tenderness.  Musculoskeletal:        General: No tenderness. Normal range of motion.  Lymphadenopathy:     Cervical: No cervical adenopathy.  Skin:    General: Skin is warm and dry.     Coloration: Skin is not pale.     Findings: No erythema or rash.  Neurological:     General: No focal deficit present.     Mental Status: She is alert and oriented to person, place, and time.     Motor: No abnormal muscle tone.  Psychiatric:        Mood and Affect: Mood normal.        Behavior: Behavior normal.        Thought Content:  Thought content normal.        Judgment: Judgment normal.      CBC:    BMET Recent Labs    09/12/23 0803 09/13/23 0459  NA 139 138  K 3.8 3.8  CL 110 106  CO2 24 23  GLUCOSE 85 114*  BUN 12 23  CREATININE 0.57 0.73  CALCIUM 8.4* 8.4*     Liver Panel  No results for input(s): "PROT", "ALBUMIN", "AST", "ALT", "ALKPHOS", "BILITOT", "BILIDIR", "IBILI" in the last 72 hours.     Sedimentation Rate No results for input(s): "ESRSEDRATE" in the last 72 hours. C-Reactive Protein No results for input(s): "CRP" in the last 72 hours.  Micro Results: Recent Results (from the past 720 hour(s))  Culture, blood (Routine x 2)     Status: None   Collection Time: 09/07/23  1:56 PM   Specimen: BLOOD  Result Value Ref Range Status   Specimen Description BLOOD LEFT ANTECUBITAL  Final   Special Requests   Final    BOTTLES DRAWN AEROBIC AND ANAEROBIC Blood Culture adequate volume   Culture   Final    NO GROWTH 5 DAYS Performed at [facility], [address], [place], [place] [address]    Report Status 09/12/2023 FINAL  Final  Culture, blood (Routine x 2)     Status: None   Collection Time: 09/07/23  2:01 PM   Specimen: BLOOD  Result Value Ref Range Status   Specimen Description BLOOD LEFT ANTECUBITAL  Final   Special Requests   Final    BOTTLES DRAWN AEROBIC ONLY Blood Culture adequate volume   Culture   Final    NO GROWTH 5 DAYS Performed at [facility], [address], [place], [place] [address]    Report Status 09/12/2023 FINAL  Final    Studies/Results: PERIPHERAL VASCULAR CATHETERIZATION  Result Date: 09/12/2023 Images from the original result were not included.   Patient name: Isabel Barnes         MRN: [id number]        DOB: Oct 02, 1941            Sex: female  09/07/2023 - 09/12/2023 Pre-operative Diagnosis: Right lower extremity critical and ischemia with tissue loss in the calf Post-operative diagnosis:  Same Surgeon:  Victorino Sparrow, MD Procedure  Performed: 1.  Ultrasound-guided micropuncture access of the left common femoral artery in retrograde fashion 2.  Aortogram 3.  Dedicated angiography of the right common iliac, hypogastric, external neck artery 4.  Second-order cannulation, right lower extremity angiogram 5.  Moderate sedation time 18 minutes 6.  Contrast volume 100 mL 7.  Device assisted closure-Mynx   Indications: Patient is an 82 year old female with right lower extremity nonhealing wounds on the calves.  She had nonpalpable pulses in the right foot with toe pressure of 40 mmHg.  On physical exam, wounds appear to be venous, however being that they are nonhealing, and she has peripheral arterial disease, she would benefit from right lower extremity angiogram in effort to define and improve distal perfusion for wound healing.  After discussing risk and benefits, prescription elected to proceed.  Findings: Aortogram: Widely patent renal arteries.  No flow-limiting stenosis appreciated in the infrarenal aorta, bilateral iliac system On the right: Common femoral artery widely patent, profunda widely patent, superficial femoral artery widely patent.  There is mild disease in the popliteal artery.  Distally, single-vessel peroneal artery runoff filling the foot via medial and lateral perforators.  Reconstitution of the dorsalis pedis and plantar arteries.             Procedure:  The patient was identified in the holding area and taken to room 8.  The patient was then placed supine on the table and prepped and draped in the usual sterile fashion.  A time out was called.  Moderate sedation including fentanyl and Versed were administered.  Ultrasound was used to evaluate the left common femoral artery.  It was patent .  A digital ultrasound image was acquired.  A micropuncture needle was used to access the left common femoral artery under ultrasound guidance.  An 018 wire was advanced without resistance and a micropuncture sheath was placed.  The 018 wire  was removed and a benson wire was placed.  The micropuncture sheath was exchanged for a 5 french sheath.  An omniflush catheter was advanced over the wire to the level of L-1.  An abdominal angiogram was obtained.  Next, the Omni Flush catheter was brought to the aortic bifurcation and multiple views followed to ensure there was no stenosis as there was significant bowel gas.  Next, using the omniflush catheter and a benson wire, the aortic bifurcation was crossed and the catheter was placed into theright external iliac artery and right runoff was obtained.  No intervention was undertaken.  Impression: Normal blood flow to the level of the knee with single-vessel peroneal outflow filling plantar arteries, dorsalis pedis artery via medial and lateral perforators.    Victorino Sparrow MD [facility] Office: [phone]       Assessment/Plan:  INTERVAL HISTORY: patient seen by Dr. Lajoyce Corners who agrees that patient would be best served by curative BKA   Principal Problem:   Osteomyelitis of right tibia [facility]) Active Problems:   Chronic pain disorder   Hypertension   Hyperlipemia   Aortic valve stenosis   Anemia   Atrial fibrillation (HCC)   Peripheral arterial disease (HCC)   LLE Cellulitis   RLE Wound  R Heel Ulcer    ROBIN BUTTS is a 82 y.o. female with  tibial osteomyelitis, as well as early calcaneal osteomyelitis, PVD, right Prosthetic knee  I called the patient's daughter after the visit today we had with Elease Hashimoto and went over  reasoning about why a below the knee amputation for cure would be the best option and would be the option I would choose if it was my own mother when I was asked that question.  She is also interested in talking to Dr. Lajoyce Corners but she understands the risks to [place] if she does not have a curative surgery which include spread of the infection within the bone potential spread the bloodstream with risk of dissemination to other organs  risk of sepsis and death.  I have relayed cell phone of Daughter (who is 6 hours ahead of [place] in [place]) to Dr. Lajoyce Corners.  The patient has been on cefepime metronidazole and vancomycin.  I will at minimum change the flagyl to po for now.  I have personally spent 56 minutes involved in face-to-face and non-face-to-face activities for this patient on the day of the visit. Professional time spent includes the following activities: Preparing to see the patient (review of tests), Obtaining and/or reviewing separately obtained history (admission/discharge record), Performing a medically appropriate examination and/or evaluation , Ordering medications/tests/procedures, referring and communicating with other health care professionals, Documenting clinical information in the EMR, Independently interpreting results (not separately reported), Communicating results to the patient/family/caregiver, Counseling and educating the patient/family/caregiver and Care coordination (not separately reported).     LOS: 6 days   Acey Lav 09/13/2023, 2:40 PM

## 2023-09-13 NOTE — Assessment & Plan Note (Signed)
LDL 103. Goal <70, especially in setting of PAD. - Start pravastatin 40mg 

## 2023-09-13 NOTE — Assessment & Plan Note (Addendum)
Remains afebrile.  ABI was noncompressible on the right with an ischemic toe pressure of 40; Angiogram with grossly patent vessels.  Dr. Lajoyce Corners saw patient yesterday and offered options of right BKA versus prolonged IV antibiotics.  Consulted ID who essentially said the same. - Continue IV Abx with Vanc, Cefepime, and Flagyl -Pending patient decision about further management.

## 2023-09-13 NOTE — Assessment & Plan Note (Addendum)
Remains afebrile. Bcx NGTD x5 days. - Antibiotic treatment as above

## 2023-09-13 NOTE — Progress Notes (Signed)
Mobility Specialist Progress Note:   09/13/23 1456  Mobility  Activity Ambulated with assistance in hallway  Level of Assistance Contact guard assist, steadying assist  Assistive Device Front wheel walker  Distance Ambulated (ft) 100 ft  Activity Response Tolerated well  Mobility Referral Yes  $Mobility charge 1 Mobility  Mobility Specialist Start Time (ACUTE ONLY) 1355  Mobility Specialist Stop Time (ACUTE ONLY) 1405  Mobility Specialist Time Calculation (min) (ACUTE ONLY) 10 min   Pre Mobility: 87 HR During Mobility: 97 HR  Post Mobility: 93 HR   Pt received in bed, agreeable to mobility. C/o slight fatigue towards EOS, otherwise asymptomatic throughout. Pt returned to bed with call bell in reach and all needs met.   Leory Plowman  Mobility Specialist Please contact via [employer] office at [phone]

## 2023-09-13 NOTE — Progress Notes (Signed)
PHARMACIST LIPID MONITORING   Isabel Barnes is a 82 y.o. female admitted on 09/07/2023 with chronic right heel ulcer. Pharmacy has been consulted to optimize lipid-lowering therapy with the indication of secondary prevention for clinical ASCVD.  Recent Labs:  Lipid Panel (last 6 months):   Lab Results  Component Value Date   CHOL 162 09/13/2023   TRIG 45 09/13/2023   HDL 50 09/13/2023   CHOLHDL 3.2 09/13/2023   VLDL 9 09/13/2023   LDLCALC 103 (H) 09/13/2023    Hepatic function panel (last 6 months):   Lab Results  Component Value Date   AST 30 09/07/2023   ALT 29 09/07/2023   ALKPHOS 103 09/07/2023   BILITOT 0.6 09/07/2023   SCr (since admission):   Serum creatinine: 0.73 mg/dL 16/10/96 0454 Estimated creatinine clearance: 40.8 mL/min  Current therapy and lipid therapy tolerance Current lipid-lowering therapy: None Previous lipid-lowering therapies (if applicable): rosuvastatin 5 mg daily (2023) Documented or reported allergies or intolerances to lipid-lowering therapies (if applicable): GI upset and patient reported she is unsure if experienced myopathy in the past. Reports has not tried ezetimibe or other statins.  Assessment:   Patient agrees with changes to lipid-lowering therapy, willing to retry a statin at this time. Will choose a moderate intensity statin given that was her previous regimen. Given her age and LDL goal < 70, moderate intensity statin would benefit her.  Plan:    1.Statin intensity: Start Pravastatin 40 mg PO daily.  2.Refer to lipid clinic: Will follow up with her PCP - Jamal Collin, PA-C  3.Follow-up labs after discharge:  Changes in lipid therapy were made. Check a lipid panel in 8-12 weeks then annually.      Alesia Banda, PharmD Candidate Class of 2025 09/13/2023, 9:30 AM

## 2023-09-13 NOTE — Plan of Care (Signed)
  Problem: Nutrition: Goal: Adequate nutrition will be maintained Outcome: Progressing   Problem: Elimination: Goal: Will not experience complications related to urinary retention Outcome: Progressing   Problem: Education: Goal: Knowledge of General Education information will improve Description: Including pain rating scale, medication(s)/side effects and non-pharmacologic comfort measures Outcome: Not Progressing   Problem: Health Behavior/Discharge Planning: Goal: Ability to manage health-related needs will improve Outcome: Not Progressing   Problem: Activity: Goal: Risk for activity intolerance will decrease Outcome: Not Progressing   Problem: Coping: Goal: Level of anxiety will decrease Outcome: Not Progressing   Problem: Pain Managment: Goal: General experience of comfort will improve Outcome: Not Progressing   Problem: Health Behavior/Discharge Planning: Goal: Ability to safely manage health-related needs after discharge will improve Outcome: Not Progressing

## 2023-09-13 NOTE — Progress Notes (Signed)
[name], female   DOB: 1941/06/24, 82 y.o.   MRN: [id number] I called patient's daughter who was in [place] at patient's request.  Patient's daughter states that the patient will only consider IV antibiotics.  Discussed that this is suppressive but patient may still require a amputation.  Patient's daughter states they would like to proceed with the home IV antibiotics with the understanding that this will not cure the infection and may suppress it for a period of time.

## 2023-09-13 NOTE — Progress Notes (Signed)
  Progress Note    09/13/2023 7:12 AM 1 Day Post-Op  Subjective:  says she is terrible mostly because of decision she has to make regarding her right leg. Also unable to tolerate her breakfast   Vitals:   09/12/23 2320 09/13/23 0311  BP: (!) 123/53 (!) 141/59  Pulse: 75 70  Resp: 20 15  Temp: 97.9 F (36.6 C) 98 F (36.7 C)  SpO2: 97% 95%   Physical Exam: Cardiac:  regular Lungs:  non labored Extremities:  left fem access site clean, dry intact without swelling or hematoma Neurologic: alert and oriented  CBC    Component Value Date/Time   WBC 8.6 09/13/2023 0459   RBC 3.36 (L) 09/13/2023 0459   HGB 9.7 (L) 09/13/2023 0459   HGB 12.0 02/17/2016 0920   HCT 29.0 (L) 09/13/2023 0459   HCT 37.1 02/17/2016 0920   PLT 264 09/13/2023 0459   PLT 318 02/17/2016 0920   MCV 86.3 09/13/2023 0459   MCV 88.0 02/17/2016 0920   MCH 28.9 09/13/2023 0459   MCHC 33.4 09/13/2023 0459   RDW 14.6 09/13/2023 0459   RDW 15.0 (H) 02/17/2016 0920   LYMPHSABS 0.8 09/07/2023 1404   LYMPHSABS 1.6 02/17/2016 0920   MONOABS 0.5 09/07/2023 1404   MONOABS 0.6 02/17/2016 0920   EOSABS 0.6 (H) 09/07/2023 1404   EOSABS 0.9 (H) 02/17/2016 0920   BASOSABS 0.1 09/07/2023 1404   BASOSABS 0.2 (H) 02/17/2016 0920    BMET    Component Value Date/Time   NA 138 09/13/2023 0459   NA 133 (A) 03/16/2022 0000   NA 142 02/17/2016 0920   K 3.8 09/13/2023 0459   K 4.2 02/17/2016 0920   CL 106 09/13/2023 0459   CO2 23 09/13/2023 0459   CO2 26 02/17/2016 0920   GLUCOSE 114 (H) 09/13/2023 0459   GLUCOSE 93 02/17/2016 0920   BUN 23 09/13/2023 0459   BUN 9 03/16/2022 0000   BUN 13.3 02/17/2016 0920   CREATININE 0.73 09/13/2023 0459   CREATININE 0.7 02/17/2016 0920   CALCIUM 8.4 (L) 09/13/2023 0459   CALCIUM 8.8 02/17/2016 0920   GFRNONAA >60 09/13/2023 0459   GFRAA >60 06/25/2017 1506    INR    Component Value Date/Time   INR 1.0 09/07/2023 1404     Intake/Output Summary (Last 24 hours) at  09/13/2023 1914 Last data filed at 09/12/2023 2200 Gross per 24 hour  Intake 708.44 ml  Output 1100 ml  Net -391.56 ml     Assessment/Plan:  82 y.o. female is s/p Aortogram, Arteriogram RLE 1 Day Post-Op   She is optimized from vascular standpoint. Single vessel flow via the peroneal artery that gives off perforators to plantar arteries, DP and lateral perforators in the foot Left common femoral access site is without swelling or hematoma Continue Aspirin. Would recommend statin however she has numerous medication allergies so this may not be tolerated Seen by Dr. Lajoyce Corners as well who recommends long term IV Abx vs right BKA in setting of tibial osteo and abscess as well as early osteo in calcaneus Vascular will be available as needed    Graceann Congress, PA-C [facility] [phone] 09/13/2023 7:12 AM

## 2023-09-13 NOTE — Assessment & Plan Note (Deleted)
-   Continue to monitor vitals and symptoms

## 2023-09-13 NOTE — Progress Notes (Signed)
Pharmacy Antibiotic Note  Isabel Barnes is a 82 y.o. female admitted on 09/07/2023 with  chronic right heel ulcer  . Pharmacy has been consulted for Vancomycin and Zosyn per dosing. Patient was given the decision to continue IV abx or surgical amputation so pharmacy waited to get Vancomycin trough until final decision was made. Patient wants to continue the use of IV abx for about 6 weeks for osteomyelitis, then transition to PO abx for indefinitely..  We ordered a Vancomycin peak and trough level to adjust therapy, however the plan is to switch to Daptomycin tomorrow 10/17.   Plan: Continue Vancomycin 750 mg IV q24 hrs until vanc trough results, then recalculate. Continue Zosyn IV 3.375g q8 hrs Monitor renal function  Height: 5' (152.4 cm) Weight: 55.3 kg (122 lb) IBW/kg (Calculated) : 45.5  Temp (24hrs), Avg:98.1 F (36.7 C), Min:97.7 F (36.5 C), Max:98.6 F (37 C)  Recent Labs  Lab 09/07/23 1422 09/07/23 1849 09/08/23 0425 09/09/23 1005 09/10/23 0604 09/11/23 0851 09/12/23 0803 09/13/23 0459  WBC  --   --    < > 4.0 4.0 4.5 5.6 8.6  CREATININE  --   --    < > 0.83 0.89 0.66 0.57 0.73  LATICACIDVEN 1.6 1.0  --   --   --   --   --   --    < > = values in this interval not displayed.    Estimated Creatinine Clearance: 40.8 mL/min (by C-G formula based on SCr of 0.73 mg/dL).    Allergies  Allergen Reactions   Dilaudid [Hydromorphone Hcl] Shortness Of Breath   Gabapentin Other (See Comments)    Hoarseness , headache and sore throat   Latex Rash    Severe rash   Lyrica [Pregabalin] Other (See Comments)    No balance , had to walk with cane , Blurred vision,weakness.   Oxycodone Shortness Of Breath and Other (See Comments)    Tolerated oxycodone during admission to hospital 02/06/22   Singulair [Montelukast] Shortness Of Breath and Other (See Comments)    Vision issues, also   Ciprofloxacin Hcl Nausea And Vomiting and Other (See Comments)    Nausea and vomiting with  by mouth form   Codeine Other (See Comments)    Hallucinations   Methadone Nausea And Vomiting and Other (See Comments)    Severe nausea and vomiting   Metronidazole Nausea And Vomiting and Other (See Comments)    Gastric pain   Oysters [Shellfish Allergy] Other (See Comments)    "Terrible gastric upset and cramping."   Clindamycin/Lincomycin Diarrhea and Nausea Only   Donepezil Diarrhea and Other (See Comments)    Severe diarrhea   Sulfa Antibiotics Diarrhea and Other (See Comments)    GI issues    Tape Other (See Comments)    ADHESIVE TAPE-Severe rash   Zetia [Ezetimibe] Diarrhea   Elemental Sulfur Nausea And Vomiting   Iodine Rash   Other Rash    All Antibiotic ointments/ creams   Oyster Shell Rash   Penicillin G Nausea And Vomiting and Rash   Penicillins Nausea And Vomiting and Rash    Has patient had a PCN reaction causing immediate rash, facial/tongue/throat swelling, SOB or lightheadedness with hypotension: Yes Has patient had a PCN reaction causing severe rash involving mucus membranes or skin necrosis: No Has patient had a PCN reaction that required hospitalization No Has patient had a PCN reaction occurring within the last 10 years: No If all of the above answers are "NO",  then may proceed with Cephalosporin use.    Povidone Iodine Rash and Other (See Comments)    Oyster shell products- Rash    Skintegrity Hydrogel [Skin Protectants, Misc.] Rash   Tapentadol Other (See Comments)    Nightmares **Nucynta**    Antimicrobials this admission: Zosyn 3.375g q8 hrs 10/16>> Vancomycin 750 mg q24 hrs10/10 >> Metronidazole 500 mg PO q12 hrs 10/15 >> 10/16 Metronidazole 500 mg IV q12 hrs 10/10 >> 10/14 Cefepime 2 g q12 hrs 10/9 >> 10/16  Dose adjustments this admission: N/a  Microbiology results: 10/9 BCx: NGTD x 5 days  Thank you for allowing pharmacy to be a part of this patient's care.  Alesia Banda 09/13/2023 2:13 PM

## 2023-09-13 NOTE — Assessment & Plan Note (Deleted)
HR wnl.  - Continue to monitor vitals and symptoms

## 2023-09-13 NOTE — Progress Notes (Signed)
Daily Progress Note Intern Pager: [phone]  Patient name: Isabel Barnes Medical record number: [id number] Date of birth: 07-08-1941 Age: 82 y.o. Gender: female  Primary Care Provider: Jamal Collin, PA-C Consultants: Orthopedic surgery, ID Code Status: DNR   Pt Overview and Major Events to Date:  10/09: Admitted to FMTS 10/14: Angiogram   Assessment and Plan: Isabel Barnes is a 82 yo female with past medical history of peripheral arterial disease, chronic venous insufficiency, CVA (remote R cerebellar infarct incidentally noted in 01/2022), HTN, hypothyroidism, and depression who presented here with chronic R heel ulcer, R leg wound, and LLE cellulitis.    Patient has been offered option of BKA versus long-term antibiotics, and is currently trying to think over her decision.  She plans to discuss with family today and make her determination. Assessment & Plan RLE Wound  R Heel Ulcer Remains afebrile.  ABI was noncompressible on the right with an ischemic toe pressure of 40; Angiogram with grossly patent vessels.  Dr. Lajoyce Corners saw patient yesterday and offered options of right BKA versus prolonged IV antibiotics.  Consulted ID who essentially said the same. - Continue IV Abx with Vanc, Cefepime, and Flagyl -Pending patient decision about further management. LLE Cellulitis Remains afebrile. Bcx NGTD x5 days. - Antibiotic treatment as above Hyperlipemia LDL 103. Goal <70, especially in setting of PAD. - Start pravastatin 40mg   Chronic and Stable Conditions: HTN: Holding home Losartan due to soft BPs Hypothyroidism: Continue home Synthroid 50 mcg GERD: Continue Pantoprazole 40 mg BID Insomnia: Continue Melatonin 3 mg nightly  Peripheral arterial disease: Continue ASA Chronic pain disorder: Continue home Norco 10-3 25 every 6 hours and titrate as needed.  Tylenol 650 mg 3 times daily, ibuprofen 600 mg 3 times daily, lidocaine patches daily. Atrial fibrillation: Heart  rate continues to be within normal limits.  Continue to monitor vitals and symptoms. Hypertension: Continue home losartan 25 mg daily. Anemia: Continue home ferrous sulfate 325 mg daily Aortic valve stenosis     FEN/GI: Regular diet  PPx: Place SCDs, heparin Dispo: Pending decision regarding Abx versus BKA. Ultimately home pending clinical improvement.  Subjective:  Patient seen this AM resting in bed. She is understandably stressed regarding her potential BKA, though she does state "I know what I have to do." She endorses good family support and involvement. Ongoing HA and neck pain, well-controlled with current medication regimen. No other concerns at this time.  Objective: Temp:  [97.5 F (36.4 C)-98.6 F (37 C)] 97.7 F (36.5 C) (10/15 0842) Pulse Rate:  [63-88] 83 (10/15 0842) Resp:  [12-22] 15 (10/15 0842) BP: (123-182)/(53-126) 132/57 (10/15 0842) SpO2:  [95 %-100 %] 100 % (10/15 3086) Physical Exam: General: Alert, sitting up in bed with breakfast tray, no acute distress.  Also Cardiovascular: RRR.  4/6 holosystolic murmur unchanged from prior exam. Respiratory: CTA bilaterally.  No increased work of breathing, on room air. Abdomen: Soft, nontender, nondistended Extremities: Bilateral LEs wrapped with Ace bandages.  Laboratory: Most recent CBC Lab Results  Component Value Date   WBC 8.6 09/13/2023   HGB 9.7 (L) 09/13/2023   HCT 29.0 (L) 09/13/2023   MCV 86.3 09/13/2023   PLT 264 09/13/2023   Most recent BMP    Latest Ref Rng & Units 09/13/2023    4:59 AM  BMP  Glucose 70 - 99 mg/dL 578   BUN 8 - 23 mg/dL 23   Creatinine 4.69 - 1.00 mg/dL 6.29   Sodium 528 - 413  mmol/L 138   Potassium 3.5 - 5.1 mmol/L 3.8   Chloride 98 - 111 mmol/L 106   CO2 22 - 32 mmol/L 23   Calcium 8.9 - 10.3 mg/dL 8.4     Cyndia Skeeters, DO 09/13/2023, 11:16 AM  PGY-1, [facility] FPTS Intern pager: [phone], text pages welcome Secure chat group [facility] Teaching Service

## 2023-09-13 NOTE — Assessment & Plan Note (Deleted)
BP uptrending.  - Restart home losartan 25mg  daily

## 2023-09-13 NOTE — Progress Notes (Signed)
Physical Therapy Treatment Patient Details Name: Isabel Barnes MRN: [id number] DOB: May 15, 1941 Today's Date: 09/13/2023   History of Present Illness 82 yo female presents [facility] 10/9 on recommendation from ortho for non-healing R foot infection, has been treated with abx. PMH includes HTN, HLD, chronic venous insufficiency.    PT Comments  Pt received sitting EOB and agreeable to session. Pt able to stand with safe hand placement without assist. Pt able to tolerate increased gait distance with slightly improved upright posture and upward gaze with cues. Pt reporting increased neck pain that slightly improved with chin tucks. Pt continues to benefit from PT services to progress toward functional mobility goals.     If plan is discharge home, recommend the following: Assistance with cooking/housework;A little help with bathing/dressing/bathroom;Assist for transportation   Can travel by private vehicle        Equipment Recommendations  None recommended by PT    Recommendations for Other Services       Precautions / Restrictions Precautions Precautions: Fall Restrictions Weight Bearing Restrictions: No     Mobility  Bed Mobility               General bed mobility comments: Pt seated EOB at beginning and end of session    Transfers Overall transfer level: Needs assistance Equipment used: Rolling walker (2 wheels) Transfers: Sit to/from Stand Sit to Stand: Modified independent (Device/Increase time)                Ambulation/Gait Ambulation/Gait assistance: Supervision Gait Distance (Feet): 100 Feet Assistive device: Rolling walker (2 wheels) Gait Pattern/deviations: Step-through pattern, Trunk flexed Gait velocity: reduced     General Gait Details: slow, steady gait with cues for upright posture and upward gaze      Balance Overall balance assessment: Needs assistance Sitting-balance support: No upper extremity supported, Feet supported Sitting  balance-Leahy Scale: Good Sitting balance - Comments: sitting EOB   Standing balance support: During functional activity, Bilateral upper extremity supported Standing balance-Leahy Scale: Fair Standing balance comment: with RW support                            Cognition Arousal: Alert Behavior During Therapy: WFL for tasks assessed/performed Overall Cognitive Status: Within Functional Limits for tasks assessed                                          Exercises Other Exercises Other Exercises: chin tucks x5    General Comments General comments (skin integrity, edema, etc.): VSS on RA      Pertinent Vitals/Pain Pain Assessment Pain Assessment: Faces Faces Pain Scale: Hurts little more Pain Location: head and neck Pain Descriptors / Indicators: Discomfort, Aching Pain Intervention(s): Monitored during session, Patient requesting pain meds-RN notified     PT Goals (current goals can now be found in the care plan section) Acute Rehab PT Goals Patient Stated Goal: to return home, heal wound PT Goal Formulation: With patient Time For Goal Achievement: 09/23/23 Progress towards PT goals: Progressing toward goals    Frequency    Min 1X/week       AM-PAC PT "6 Clicks" Mobility   Outcome Measure  Help needed turning from your back to your side while in a flat bed without using bedrails?: None Help needed moving from lying on your back to sitting on  the side of a flat bed without using bedrails?: None Help needed moving to and from a bed to a chair (including a wheelchair)?: None Help needed standing up from a chair using your arms (e.g., wheelchair or bedside chair)?: None Help needed to walk in hospital room?: A Little Help needed climbing 3-5 steps with a railing? : A Little 6 Click Score: 22    End of Session   Activity Tolerance: Patient tolerated treatment well Patient left: in bed;with call bell/phone within reach Nurse  Communication: Mobility status PT Visit Diagnosis: Other abnormalities of gait and mobility (R26.89);Pain     Time: 1537-1550 PT Time Calculation (min) (ACUTE ONLY): 13 min  Charges:    $Gait Training: 8-22 mins PT General Charges $$ ACUTE PT VISIT: 1 Visit                     Johny Shock, PTA Acute Rehabilitation Services Secure Chat Preferred  Office:([phone]    Johny Shock 09/13/2023, 4:03 PM

## 2023-09-13 NOTE — Plan of Care (Signed)

## 2023-09-13 NOTE — Progress Notes (Signed)
Occupational Therapy Treatment Patient Details Name: Isabel Barnes MRN: [id number] DOB: Dec 13, 1940 Today's Date: 09/13/2023   History of present illness 82 yo female presents to [facility] on 10/9 on recommendation from ortho for non-healing R foot infection, has been treated with abx. PMH includes HTN, HLD, chronic venous insufficiency.   OT comments  Pt sitting on EOB upon therapy arrival and agreeable to participate in OT treatment session with focus on ADL re-training and functional mobility. Pt met 3/4 therapy goals this date while completing toileting, grooming, and LB dressing at mod I level. Required SBA for toilet transfer only d/t to IV pole management needs while utilizing RW to navigate from bed to toilet. Pt is currently deciding if she will undergo recommended R BKA. If no surgery is performed, no OT follow up is recommended. If surgery is performed, OT will perform a re-evaluation to determine follow up OT services needs. OT will continue to follow patient acutely.       If plan is discharge home, recommend the following:  Assistance with cooking/housework   Equipment Recommendations  None recommended by OT       Precautions / Restrictions Precautions Precautions: Fall Restrictions Weight Bearing Restrictions: No       Mobility Bed Mobility Overal bed mobility:  (sitting on EOB upon therapy arrival)     Transfers Overall transfer level: Needs assistance Equipment used: Rolling walker (2 wheels) Transfers: Sit to/from Stand, Bed to chair/wheelchair/BSC Sit to Stand: Modified independent (Device/Increase time)     Step pivot transfers: Supervision     General transfer comment: supervision provided only for IV pole management. Pt requires no physical assist for sit to stand or functional transfers. No VC for hand placement with RW management.     Balance Overall balance assessment: Needs assistance Sitting-balance support: No upper extremity supported, Feet  supported Sitting balance-Leahy Scale: Good Sitting balance - Comments: sitting EOB   Standing balance support: Single extremity supported, During functional activity Standing balance-Leahy Scale: Fair Standing balance comment: standing at sink to complete grooming          ADL either performed or assessed with clinical judgement   ADL       Grooming: Oral care;Brushing hair;Modified independent;Standing        Lower Body Dressing: Modified independent;Sit to/from stand;Sitting/lateral leans Lower Body Dressing Details (indicate cue type and reason): sitting on toilet, doffed/donned mesh underwear Toilet Transfer: Supervision/safety;Ambulation;Regular Toilet;Rolling walker (2 wheels);Grab bars Toilet Transfer Details (indicate cue type and reason): SBA provided due to IV pole management. Pt demonstrated good safety awareness, balance, and transfer technique. Toileting- Clothing Manipulation and Hygiene: Modified independent;Sit to/from stand;Sitting/lateral lean                Cognition Arousal: Alert Behavior During Therapy: WFL for tasks assessed/performed Overall Cognitive Status: Within Functional Limits for tasks assessed                  General Comments VSS on RA    Pertinent Vitals/ Pain       Pain Assessment Pain Assessment: Faces Faces Pain Scale: No hurt         Frequency  Min 1X/week        Progress Toward Goals  OT Goals(current goals can now be found in the care plan section)  Progress towards OT goals: Progressing toward goals (3/4 therapy goals met this session)  ADL Goals Pt Will Perform Grooming:  (goal met 10/15) Pt Will Perform Lower Body Dressing:  (goa  met 10/15) Pt Will Perform Toileting - Clothing Manipulation and hygiene:  (goal met 10/15)  Plan         AM-PAC OT "6 Clicks" Daily Activity     Outcome Measure   Help from another person eating meals?: None Help from another person taking care of personal grooming?:  None Help from another person toileting, which includes using toliet, bedpan, or urinal?: A Little Help from another person bathing (including washing, rinsing, drying)?: A Little Help from another person to put on and taking off regular upper body clothing?: A Little Help from another person to put on and taking off regular lower body clothing?: A Little 6 Click Score: 20    End of Session Equipment Utilized During Treatment: Gait belt;Rolling walker (2 wheels)  OT Visit Diagnosis: Other abnormalities of gait and mobility (R26.89);Pain;Muscle weakness (generalized) (M62.81);Unsteadiness on feet (R26.81) Pain - Right/Left: Right Pain - part of body: Ankle and joints of foot   Activity Tolerance Patient tolerated treatment well   Patient Left in bed;with call bell/phone within reach           Time: 1446-1521 OT Time Calculation (min): 35 min  Charges: OT General Charges $OT Visit: 1 Visit OT Treatments $Self Care/Home Management : 23-37 mins  Isabel Barnes, OTR/L,CBIS  Supplemental OT - [facility] and [facility] Secure Chat Preferred    Isabel Barnes, Isabel Barnes 09/13/2023, 3:56 PM

## 2023-09-14 ENCOUNTER — Other Ambulatory Visit: Payer: Self-pay

## 2023-09-14 DIAGNOSIS — M86261 Subacute osteomyelitis, right tibia and fibula: Secondary | ICD-10-CM | POA: Diagnosis not present

## 2023-09-14 DIAGNOSIS — Z880 Allergy status to penicillin: Secondary | ICD-10-CM

## 2023-09-14 DIAGNOSIS — Z96651 Presence of right artificial knee joint: Secondary | ICD-10-CM | POA: Diagnosis not present

## 2023-09-14 LAB — CBC
HCT: 27.2 % — ABNORMAL LOW (ref 36.0–46.0)
Hemoglobin: 8.8 g/dL — ABNORMAL LOW (ref 12.0–15.0)
MCH: 28.2 pg (ref 26.0–34.0)
MCHC: 32.4 g/dL (ref 30.0–36.0)
MCV: 87.2 fL (ref 80.0–100.0)
Platelets: 284 10*3/uL (ref 150–400)
RBC: 3.12 MIL/uL — ABNORMAL LOW (ref 3.87–5.11)
RDW: 15.4 % (ref 11.5–15.5)
WBC: 6.5 10*3/uL (ref 4.0–10.5)
nRBC: 0 % (ref 0.0–0.2)

## 2023-09-14 LAB — BASIC METABOLIC PANEL
Anion gap: 6 (ref 5–15)
BUN: 22 mg/dL (ref 8–23)
CO2: 24 mmol/L (ref 22–32)
Calcium: 8.5 mg/dL — ABNORMAL LOW (ref 8.9–10.3)
Chloride: 110 mmol/L (ref 98–111)
Creatinine, Ser: 0.55 mg/dL (ref 0.44–1.00)
GFR, Estimated: >60 mL/min (ref >60)
Glucose, Bld: 91 mg/dL (ref 70–99)
Potassium: 4 mmol/L (ref 3.5–5.1)
Sodium: 140 mmol/L (ref 135–145)

## 2023-09-14 LAB — VANCOMYCIN, PEAK: Vancomycin Pk: 19 ug/mL — ABNORMAL LOW (ref 30–40)

## 2023-09-14 MED ORDER — HYDROCODONE-ACETAMINOPHEN 10-325 MG PO TABS
1.0000 | ORAL_TABLET | Freq: Four times a day (QID) | ORAL | Status: DC
  Administered 2023-09-14 – 2023-09-16 (×9): 1 via ORAL
  Filled 2023-09-14 (×9): qty 1

## 2023-09-14 MED ORDER — PIPERACILLIN-TAZOBACTAM 3.375 G IVPB
3.3750 g | Freq: Three times a day (TID) | INTRAVENOUS | Status: DC
  Administered 2023-09-14 – 2023-09-16 (×6): 3.375 g via INTRAVENOUS
  Filled 2023-09-14 (×6): qty 50

## 2023-09-14 NOTE — Assessment & Plan Note (Signed)
LDL 103. Goal <70, especially in setting of PAD. -Continue pravastatin 40mg 

## 2023-09-14 NOTE — Assessment & Plan Note (Addendum)
Patient has elected for prolonged IV antibiotic therapy. She states she understands this is not expected to be curative, and she acknowledges that she may require AKA/BKA in the future. She hopes this will "buy me 6 months."  Management as below. - Appreciate ID recommendations - Continue IV Vanc and Cefepime, PO Flagyl - Will need PICC

## 2023-09-14 NOTE — Assessment & Plan Note (Addendum)
Pt desires prolonged course of IV abx as above. - Abx as above

## 2023-09-14 NOTE — Progress Notes (Signed)
Daily Progress Note Intern Pager: [phone]  Patient name: Isabel Barnes Medical record number: [id number] Date of birth: [birth year] Age: 82 y.o. Gender: female  Primary Care Provider: Jamal Collin, PA-C Consultants: Orthopedic surgery, ID Code Status: DNR   Pt Overview and Major Events to Date:  10/09: Admitted to FMTS 10/14: Angiogram   Assessment and Plan: Isabel Barnes is a 82 yo female with past medical history of peripheral arterial disease, chronic venous insufficiency, CVA (remote R cerebellar infarct incidentally noted in 01/2022), HTN, hypothyroidism, and depression who presented here with chronic R heel ulcer, R leg wound, and LLE cellulitis.   Patient has been offered option of BKA versus long-term antibiotics, and is interested in pursuing antibiotics at this time. Will need PICC line. Assessment & Plan Osteomyelitis of right tibia [facility]) Patient has elected for prolonged IV antibiotic therapy. She states she understands this is not expected to be curative, and she acknowledges that she may require AKA/BKA in the future. She hopes this will "buy me 6 months."  Management as below. - Appreciate ID recommendations - Continue IV Vanc and Cefepime, PO Flagyl - Will need PICC RLE Wound  R Heel Ulcer Pt desires prolonged course of IV abx as above. - Abx as above LLE Cellulitis Remains afebrile. Bcx NGTD x6 days. - Antibiotic treatment as above Hyperlipemia LDL 103. Goal <70, especially in setting of PAD. -Continue pravastatin 40mg   Chronic and Stable Conditions: HTN: Holding home Losartan due to soft BPs Hypothyroidism: Continue home Synthroid 50 mcg GERD: Continue Pantoprazole 40 mg BID Insomnia: Continue Melatonin 3 mg nightly  Peripheral arterial disease: Continue ASA Chronic pain disorder: Continue home Norco 10-3 25 every 6 hours scheduled and titrate as needed.  Tylenol 650 mg 3 times daily, ibuprofen 600 mg 3 times daily, lidocaine patches  daily. Atrial fibrillation: Heart rate continues to be within normal limits.  Continue to monitor vitals and symptoms. Hypertension: Continue home losartan 25 mg daily. Anemia: Continue home ferrous sulfate 325 mg daily Aortic valve stenosis     FEN/GI: Regular diet  PPx: Place SCDs, heparin Dispo: Ultimately home pending clinical improvement, outpatient IV antibiotics coordination.  Subjective:  Patient seen this morning sleeping in bed, though she awakes easily to verbal stimulation.  She reports she is feeling better, and has made the decision with her family to pursue IV antibiotics instead of AKA at this time.  She reports that she understands that she may need surgical intervention in the future, but she hopes that this will "buy me time" before that.  She reports ongoing pain to neck and back of her head, however this is chronic.  No other concerns.  Objective: Temp:  [97.6 F (36.4 C)-98.2 F (36.8 C)] 98 F (36.7 C) (10/16 0736) Pulse Rate:  [69-94] 69 (10/16 0736) Resp:  [14-16] 16 (10/16 0736) BP: (132-143)/(52-66) 143/56 (10/16 0736) SpO2:  [95 %-100 %] 96 % (10/16 0736) Physical Exam: General: Initially sleeping, but awakens to verbal stimulation.  Pleasant, no acute distress. Cardiovascular: RRR.  4 out of 6 holosystolic murmur unchanged. Respiratory: CTA bilaterally.  No increased work of breathing. Abdomen: Soft, nontender, nondistended Extremities: Bilateral lower extremities wrapped with Ace bandages.  SCD in place on left leg.  Laboratory: Most recent CBC Lab Results  Component Value Date   WBC 8.6 09/13/2023   HGB 9.7 (L) 09/13/2023   HCT 29.0 (L) 09/13/2023   MCV 86.3 09/13/2023   PLT 264 09/13/2023   Most recent BMP  Latest Ref Rng & Units 09/13/2023    4:59 AM  BMP  Glucose 70 - 99 mg/dL 161   BUN 8 - 23 mg/dL 23   Creatinine 0.96 - 1.00 mg/dL 0.45   Sodium 409 - 811 mmol/L 138   Potassium 3.5 - 5.1 mmol/L 3.8   Chloride 98 - 111 mmol/L 106    CO2 22 - 32 mmol/L 23   Calcium 8.9 - 10.3 mg/dL 8.4     Cyndia Skeeters, DO 09/14/2023, 7:39 AM  PGY-1, [facility] FPTS Intern pager: [phone], text pages welcome Secure chat group [facility] Teaching Service

## 2023-09-14 NOTE — Progress Notes (Signed)
Mobility Specialist Progress Note:   09/14/23 1056  Mobility  Activity Ambulated with assistance in hallway  Level of Assistance Contact guard assist, steadying assist  Assistive Device None;Front wheel walker  Distance Ambulated (ft) 100 ft  Range of Motion/Exercises Active;All extremities  Activity Response Tolerated well  Mobility Referral Yes  $Mobility charge 1 Mobility  Mobility Specialist Start Time (ACUTE ONLY) 1056  Mobility Specialist Stop Time (ACUTE ONLY) 1107  Mobility Specialist Time Calculation (min) (ACUTE ONLY) 11 min   Pt received in bed, caretaker in room, agreeable to mobility session. Ambulated in hallway with RW. Pt reported dizziness halfway through, rated 4/10. Took standing rest break, pt reported recovering. Returned pt to room, sitting up in chair, all needs met, call bell in reach and care taker in room.  Feliciana Rossetti Mobility Specialist Please contact via Special educational needs teacher or  Rehab office at [phone]

## 2023-09-14 NOTE — Progress Notes (Signed)
Subjective:  Severe nausea after taking oral metronidazole   Antibiotics:  Anti-infectives (From admission, onward)    Start     Dose/Rate Route Frequency Ordered Stop   09/14/23 1400  piperacillin-tazobactam (ZOSYN) IVPB 3.375 g        3.375 g 12.5 mL/hr over 240 Minutes Intravenous Every 8 hours 09/14/23 1009     09/13/23 2200  metroNIDAZOLE (FLAGYL) tablet 500 mg  Status:  Discontinued        500 mg Oral Every 12 hours 09/13/23 1448 09/14/23 1009   09/09/23 0400  vancomycin (VANCOREADY) IVPB 750 mg/150 mL        750 mg 150 mL/hr over 60 Minutes Intravenous Every 24 hours 09/08/23 0943     09/08/23 1200  ceFEPIme (MAXIPIME) 2 g in sodium chloride 0.9 % 100 mL IVPB  Status:  Discontinued        2 g 200 mL/hr over 30 Minutes Intravenous Every 12 hours 09/08/23 1109 09/14/23 1009   09/08/23 0945  metroNIDAZOLE (FLAGYL) IVPB 500 mg  Status:  Discontinued        500 mg 100 mL/hr over 60 Minutes Intravenous Every 12 hours 09/08/23 0939 09/13/23 1447   09/07/23 2045  ceFEPIme (MAXIPIME) 2 g in sodium chloride 0.9 % 100 mL IVPB        2 g 200 mL/hr over 30 Minutes Intravenous  Once 09/07/23 2035 09/08/23 0102   09/07/23 2045  vancomycin (VANCOREADY) IVPB 1250 mg/250 mL        1,250 mg 166.7 mL/hr over 90 Minutes Intravenous  Once 09/07/23 2035 09/08/23 0505       Medications: Scheduled Meds:  acetaminophen  650 mg Oral TID   aspirin EC  81 mg Oral Daily   cycloSPORINE  1 drop Both Eyes Daily   heparin  5,000 Units Subcutaneous Q8H   HYDROcodone-acetaminophen  1 tablet Oral Q6H   ibuprofen  600 mg Oral TID   levothyroxine  100 mcg Oral q morning   lidocaine  1 patch Transdermal Q24H   loratadine  10 mg Oral Daily   losartan  25 mg Oral Daily   melatonin  3 mg Oral QPM   multivitamin with minerals   Oral Daily   pantoprazole  40 mg Oral BID   polyethylene glycol  17 g Oral Daily   pravastatin  40 mg Oral q1800   senna-docusate  1 tablet Oral QHS   silver  sulfADIAZINE   Topical Daily   sodium chloride flush  3 mL Intravenous Q12H   Continuous Infusions:  piperacillin-tazobactam (ZOSYN)  IV     vancomycin 750 mg (09/14/23 0509)   PRN Meds:.acetaminophen, fluticasone, hydrALAZINE, labetalol, ondansetron (ZOFRAN) IV, promethazine, sodium chloride flush    Objective: Weight change:  No intake or output data in the 24 hours ending 09/14/23 1144  Blood pressure (!) 163/74, pulse 90, temperature 98.3 F (36.8 C), temperature source Oral, resp. rate 17, height 5' (1.524 m), weight 55.3 kg, SpO2 99%. Temp:  [97.6 F (36.4 C)-98.3 F (36.8 C)] 98.3 F (36.8 C) (10/16 1115) Pulse Rate:  [69-94] 90 (10/16 1115) Resp:  [14-17] 17 (10/16 1115) BP: (141-163)/(52-74) 163/74 (10/16 1115) SpO2:  [95 %-100 %] 99 % (10/16 1115)  Physical Exam: Physical Exam Constitutional:      General: She is not in acute distress.    Appearance: She is well-developed. She is not diaphoretic.  HENT:     Head: Normocephalic and atraumatic.  Right Ear: External ear normal.     Left Ear: External ear normal.     Mouth/Throat:     Pharynx: No oropharyngeal exudate.  Eyes:     General: No scleral icterus.    Conjunctiva/sclera: Conjunctivae normal.     Pupils: Pupils are equal, round, and reactive to light.  Cardiovascular:     Rate and Rhythm: Normal rate and regular rhythm.  Pulmonary:     Effort: Pulmonary effort is normal. No respiratory distress.     Breath sounds: No wheezing.  Abdominal:     General: Bowel sounds are normal. There is no distension.     Palpations: Abdomen is soft.  Musculoskeletal:        General: No tenderness. Normal range of motion.  Lymphadenopathy:     Cervical: No cervical adenopathy.  Skin:    General: Skin is warm and dry.     Coloration: Skin is not pale.     Findings: No erythema or rash.  Neurological:     General: No focal deficit present.     Mental Status: She is alert and oriented to person, place, and time.      Motor: No abnormal muscle tone.     Coordination: Coordination normal.  Psychiatric:        Mood and Affect: Mood normal.        Behavior: Behavior normal.        Thought Content: Thought content normal.        Judgment: Judgment normal.     Foot bandaged  CBC:    BMET Recent Labs    09/13/23 0459 09/14/23 0818  NA 138 140  K 3.8 4.0  CL 106 110  CO2 23 24  GLUCOSE 114* 91  BUN 23 22  CREATININE 0.73 0.55  CALCIUM 8.4* 8.5*     Liver Panel  No results for input(s): "PROT", "ALBUMIN", "AST", "ALT", "ALKPHOS", "BILITOT", "BILIDIR", "IBILI" in the last 72 hours.     Sedimentation Rate No results for input(s): "ESRSEDRATE" in the last 72 hours. C-Reactive Protein No results for input(s): "CRP" in the last 72 hours.  Micro Results: Recent Results (from the past 720 hour(s))  Culture, blood (Routine x 2)     Status: None   Collection Time: 09/07/23  1:56 PM   Specimen: BLOOD  Result Value Ref Range Status   Specimen Description BLOOD LEFT ANTECUBITAL  Final   Special Requests   Final    BOTTLES DRAWN AEROBIC AND ANAEROBIC Blood Culture adequate volume   Culture   Final    NO GROWTH 5 DAYS Performed at [facility], [address] [place], [place] [address]    Report Status 09/12/2023 FINAL  Final  Culture, blood (Routine x 2)     Status: None   Collection Time: 09/07/23  2:01 PM   Specimen: BLOOD  Result Value Ref Range Status   Specimen Description BLOOD LEFT ANTECUBITAL  Final   Special Requests   Final    BOTTLES DRAWN AEROBIC ONLY Blood Culture adequate volume   Culture   Final    NO GROWTH 5 DAYS Performed at [facility], [address] [place], [place] [address]    Report Status 09/12/2023 FINAL  Final    Studies/Results: [place] EKG SITE RITE  Result Date: 09/14/2023 If Site Rite image not attached, placement could not be confirmed due to current cardiac rhythm.     Assessment/Plan:  INTERVAL HISTORY: Patient does not  want amputation but wants to leave with IV antibiotics followed by pills  Principal Problem:   Osteomyelitis of right tibia [facility]) Active Problems:   Chronic pain disorder   Hypertension   Hyperlipemia   Aortic valve stenosis   Anemia   Atrial fibrillation (HCC)   Peripheral arterial disease (HCC)   LLE Cellulitis   RLE Wound  R Heel Ulcer    Isabel Barnes is a 82 y.o. female with  tibial osteomyelitis, as well as early calcaneal osteomyelitis, PVD, right Prosthetic knee  # Osteomyelitis of tibia, calcaneous  The patient would like to go home with IV abx  The regimen we would like to use would be IV daptomycin once daily along with a continuous infusion of Zosyn where the bag of Zosyn can be exchanged once per day.  She does have a history remotely of rash with PCN.  I would propose an amoxicillin challenge but apparently when she has had amoxicillin this is also made her quite sick.  We will simply proceed with Zosyn with close monitoring for now and if she does well overnight add daptomycin and order a dual-lumen PICC so that she can go home with both IV antibiotics.  I will then plan on seeing her in my clinic and trying to craft an oral regimen for chronic suppression though this may be challenging given her intolerance of multiple oral antibiotics  I have personally spent 54 minutes involved in face-to-face and non-face-to-face activities for this patient on the day of the visit. Professional time spent includes the following activities: Preparing to see the patient (review of tests), Obtaining and/or reviewing separately obtained history (admission/discharge record), Performing a medically appropriate examination and/or evaluation , Ordering medications/tests/procedures, referring and communicating with other health care professionals, Documenting clinical information in the EMR, Independently interpreting results (not separately reported), Communicating results to the  patient/family/caregiver, Counseling and educating the patient/family/caregiver and Care coordination (not separately reported).     LOS: 7 days   Acey Lav 09/14/2023, 11:44 AM

## 2023-09-14 NOTE — Assessment & Plan Note (Addendum)
Remains afebrile. Bcx NGTD x6 days. - Antibiotic treatment as above

## 2023-09-15 ENCOUNTER — Telehealth: Payer: Self-pay | Admitting: Orthopedic Surgery

## 2023-09-15 DIAGNOSIS — M86261 Subacute osteomyelitis, right tibia and fibula: Secondary | ICD-10-CM | POA: Diagnosis not present

## 2023-09-15 DIAGNOSIS — M86172 Other acute osteomyelitis, left ankle and foot: Secondary | ICD-10-CM | POA: Diagnosis not present

## 2023-09-15 DIAGNOSIS — Z96651 Presence of right artificial knee joint: Secondary | ICD-10-CM | POA: Diagnosis not present

## 2023-09-15 DIAGNOSIS — Z880 Allergy status to penicillin: Secondary | ICD-10-CM | POA: Diagnosis not present

## 2023-09-15 DIAGNOSIS — L97519 Non-pressure chronic ulcer of other part of right foot with unspecified severity: Secondary | ICD-10-CM | POA: Diagnosis not present

## 2023-09-15 LAB — BASIC METABOLIC PANEL
Anion gap: 6 (ref 5–15)
BUN: 15 mg/dL (ref 8–23)
CO2: 25 mmol/L (ref 22–32)
Calcium: 8.3 mg/dL — ABNORMAL LOW (ref 8.9–10.3)
Chloride: 108 mmol/L (ref 98–111)
Creatinine, Ser: 0.74 mg/dL (ref 0.44–1.00)
GFR, Estimated: >60 mL/min (ref >60)
Glucose, Bld: 90 mg/dL (ref 70–99)
Potassium: 4.1 mmol/L (ref 3.5–5.1)
Sodium: 139 mmol/L (ref 135–145)

## 2023-09-15 LAB — CBC
HCT: 26.9 % — ABNORMAL LOW (ref 36.0–46.0)
Hemoglobin: 8.6 g/dL — ABNORMAL LOW (ref 12.0–15.0)
MCH: 28.3 pg (ref 26.0–34.0)
MCHC: 32 g/dL (ref 30.0–36.0)
MCV: 88.5 fL (ref 80.0–100.0)
Platelets: 277 10*3/uL (ref 150–400)
RBC: 3.04 MIL/uL — ABNORMAL LOW (ref 3.87–5.11)
RDW: 15.8 % — ABNORMAL HIGH (ref 11.5–15.5)
WBC: 6.4 10*3/uL (ref 4.0–10.5)
nRBC: 0 % (ref 0.0–0.2)

## 2023-09-15 LAB — CK: Total CK: 35 U/L — ABNORMAL LOW (ref 38–234)

## 2023-09-15 MED ORDER — CHLORHEXIDINE GLUCONATE CLOTH 2 % EX PADS
6.0000 | MEDICATED_PAD | Freq: Every day | CUTANEOUS | Status: DC
  Administered 2023-09-15 – 2023-09-16 (×2): 6 via TOPICAL

## 2023-09-15 MED ORDER — SODIUM CHLORIDE 0.9% FLUSH: 10.0000 mL | INTRAVENOUS | Status: DC | PRN

## 2023-09-15 MED ORDER — SODIUM CHLORIDE 0.9% FLUSH
10.0000 mL | Freq: Two times a day (BID) | INTRAVENOUS | Status: DC
  Administered 2023-09-15: 10 mL
  Administered 2023-09-15 – 2023-09-16 (×2): 20 mL

## 2023-09-15 MED ORDER — DAPTOMYCIN-SODIUM CHLORIDE 500-0.9 MG/50ML-% IV SOLN
500.0000 mg | Freq: Every day | INTRAVENOUS | Status: DC
  Administered 2023-09-15 – 2023-09-16 (×2): 500 mg via INTRAVENOUS
  Filled 2023-09-15 (×2): qty 50

## 2023-09-15 NOTE — Assessment & Plan Note (Deleted)
BP uptrending.  - Restart home losartan 25mg  daily

## 2023-09-15 NOTE — TOC Progression Note (Signed)
Transition of Care (TOC) - Progression Note  Donn Pierini RN, BSN Transitions of Care Unit 4E- RN Case Manager See Treatment Team for direct phone #   Patient Details  Name: Isabel Barnes MRN: [id number] Date of Birth: 04-05-1941  Transition of Care [facility]) CM/SW Contact  Zenda Alpers, Lenn Sink, RN Phone Number: 09/15/2023, 2:04 PM  Clinical Narrative:    Noted pt will need LT-IV abx, PICC line placed this am.  Per bedside RN reports that pt's daughter lives in [place], son lives in [place], has caregiver 3x week- however family is working on plan regarding abx needs for home as someone will need to be able to give abx daily.  Call placed to daughter in [place]- call had bad connection and she could not hear on her end.  CM then called son in [place]- spoke with Loraine Leriche regarding transition need for HHRN/PT and home IV abx, explained they would need someone to be able to give daily abx as HH would not be able to come daily to administer abx. Son voiced that family would not be able to come- daughter is working on coming to [place] for extended stay- however this is likely to be sometime Mid Nov. Son voiced that he and his sister are working on a plan with caregiver but not sure exactly what that plan is at this time. Per son caregiver currently comes 3xweek-MWF- caregiver is not legal guardian as far as son is aware.  Son to pass on CM info and contact to sister so she can contact CM with any further d/c info or updated to plan.   Choice offered for [facility] needs- son voiced they do not have a preference and patient has no hx with a HH provider. Son is agreeable to have [facility] services and change over to outpt PT at a later time if pt able to.   Call then made to caregiver Beckie Salts- Tresa Endo confirms she is caregiver MWF- she voiced that she has been speaking with pt's daughter and has told her that she is available to assist with abx on a daily basis until daughter can get here from [place]- as long as she will  not be needed for an extended time period. Explained to Sena that on the off days she should not be needed any longer than an hour at most for abx dose to be given and for the continuous bag to be changed out. Tresa Endo is agreeable to support daily visits at this time. Also confirmed with Tresa Endo pt has not had HH in the past.  Tresa Endo voiced that she would be available tomorrow for education on IV abx needs as well as to transport pt home.   1120- call made to [facility]- for HHRN/PT needs- after confirming staffing - [facility] has accepted referral and can support home IV abx needs along with HHPT- per liasion they can do a start of care this weekend either Sat/Sun pending on how education with Tresa Endo goes. CM has also spoken with Elita Quick w/ [facility]- who will contact kelly to schedule education for home abx tomorrow.  [facility] will follow for home IV abx needs- OPAT to be signed for final abx recs- end date 11/25- orders have been placed for HHRN/PT.  Tentative discharge plan for tomorrow if education goes well.   1400- received msg from bedside RN that daughter called and has concerns about going home w/ IV abx and is asking about SNF??- CM has sent msg to daughter requesting return  call to confirm transition plan HH vs SNF. Awaiting return call at this time.     Expected Discharge Plan: Home w Home Health Services Barriers to Discharge: Continued Medical Work up  Expected Discharge Plan and Services   Discharge Planning Services: CM Consult Post Acute Care Choice: Resumption of Svcs/PTA Provider Living arrangements for the past 2 months: Single Family Home                 DME Arranged: N/A DME Agency: NA       HH Arranged: RN, PT, IV Antibiotics HH Agency: [facility] Date HH Agency Contacted: 09/15/23 Time HH Agency Contacted: 1100 Representative spoke with at [facility] Agency: Ashley/Pam   Social Determinants of Health (SDOH) Interventions SDOH  Screenings   Food Insecurity: No Food Insecurity (05/02/2023)   Received from [employer] Needs: No Transportation Needs (05/02/2023)   Received from [facility]  Utilities: Not At Risk (05/02/2023)   Received from [facility]  Financial Resource Strain: Low Risk  (05/02/2023)   Received from [facility]  Physical Activity: Unknown (05/02/2023)   Received from [facility]  Social Connections: Moderately Integrated (05/02/2023)   Received from [facility]  Stress: No Stress Concern Present (05/02/2023)   Received from [facility]  Tobacco Use: Medium Risk (09/08/2023)    Readmission Risk Interventions    09/09/2023    3:51 PM  Readmission Risk Prevention Plan  Post Dischage Appt Complete  Medication Screening Complete  Transportation Screening Complete

## 2023-09-15 NOTE — Telephone Encounter (Signed)
Pt is in the hospital pending possible BKA.

## 2023-09-15 NOTE — Progress Notes (Signed)
Mobility Specialist Progress Note:    09/15/23 1357  Mobility  Activity Ambulated with assistance in hallway  Level of Assistance Contact guard assist, steadying assist  Assistive Device Front wheel walker  Distance Ambulated (ft) 60 ft  Activity Response Tolerated well  Mobility Referral Yes  $Mobility charge 1 Mobility  Mobility Specialist Start Time (ACUTE ONLY) 1357  Mobility Specialist Stop Time (ACUTE ONLY) 1405  Mobility Specialist Time Calculation (min) (ACUTE ONLY) 8 min   Pt received in bed, agreeable to mobility session. Tolerated well, c/o LE pain. Returned pt to room, pt able to brush teeth at sink with CGA (gait belt). Returned to bed, call bell in reach, all needs met.   Feliciana Rossetti Mobility Specialist Please contact via Special educational needs teacher or  Rehab office at [phone]

## 2023-09-15 NOTE — Plan of Care (Signed)

## 2023-09-15 NOTE — Telephone Encounter (Signed)
Pt's daughter is asking for a call back from Dr Lajoyce Corners. Pt's daughter Jacki Cones states she leaves in [place] and its a time diff. Please call her about her mom at [phone].

## 2023-09-15 NOTE — Assessment & Plan Note (Deleted)
Patient with history of extensive peripheral arterial disease with chronic claudication symptoms, no worse here. Per review, last aortogram completed in 2023 with no vascular intervention at that time, but occlusions present on R side. Patient put on ASA and Crestor for PAD, has not been taking Crestor, intermittently takes ASA.  - Continue ASA 81 mg daily - Vascular Surgery consulted  - ABIs abnormal as above  - Aortogram 10/14  Cont ASA - Vascular Surgery consulted, appreciate recs

## 2023-09-15 NOTE — Progress Notes (Addendum)
PHARMACY CONSULT NOTE FOR:  OUTPATIENT  PARENTERAL ANTIBIOTIC THERAPY (OPAT)  Indication: Tibia osteomyelitis Regimen: Daptomycin 500 mg IV daily and Zosyn 13.5 mg IV continuous infusion every day End date: 10/24/23, to follow up with Dr. Daiva Eves for oral tail end.  IV antibiotic discharge orders are pended. To discharging provider:  please sign these orders via discharge navigator,  Select New Orders & click on the button choice - Manage This Unsigned Work.     Thank you for allowing pharmacy to be a part of this patient's care.  Louie Casa Kriston Pasquarello 09/15/2023, 7:32 AM

## 2023-09-15 NOTE — Progress Notes (Signed)
Subjective:  She was having PICC line placed   Antibiotics:  Anti-infectives (From admission, onward)    Start     Dose/Rate Route Frequency Ordered Stop   09/15/23 1200  DAPTOmycin (CUBICIN) IVPB 500 mg/32mL premix        500 mg 100 mL/hr over 30 Minutes Intravenous Daily 09/15/23 0932     09/14/23 1400  piperacillin-tazobactam (ZOSYN) IVPB 3.375 g        3.375 g 12.5 mL/hr over 240 Minutes Intravenous Every 8 hours 09/14/23 1009     09/13/23 2200  metroNIDAZOLE (FLAGYL) tablet 500 mg  Status:  Discontinued        500 mg Oral Every 12 hours 09/13/23 1448 09/14/23 1009   09/09/23 0400  vancomycin (VANCOREADY) IVPB 750 mg/150 mL  Status:  Discontinued        750 mg 150 mL/hr over 60 Minutes Intravenous Every 24 hours 09/08/23 0943 09/15/23 0932   09/08/23 1200  ceFEPIme (MAXIPIME) 2 g in sodium chloride 0.9 % 100 mL IVPB  Status:  Discontinued        2 g 200 mL/hr over 30 Minutes Intravenous Every 12 hours 09/08/23 1109 09/14/23 1009   09/08/23 0945  metroNIDAZOLE (FLAGYL) IVPB 500 mg  Status:  Discontinued        500 mg 100 mL/hr over 60 Minutes Intravenous Every 12 hours 09/08/23 0939 09/13/23 1447   09/07/23 2045  ceFEPIme (MAXIPIME) 2 g in sodium chloride 0.9 % 100 mL IVPB        2 g 200 mL/hr over 30 Minutes Intravenous  Once 09/07/23 2035 09/08/23 0102   09/07/23 2045  vancomycin (VANCOREADY) IVPB 1250 mg/250 mL        1,250 mg 166.7 mL/hr over 90 Minutes Intravenous  Once 09/07/23 2035 09/08/23 0505       Medications: Scheduled Meds:  acetaminophen  650 mg Oral TID   aspirin EC  81 mg Oral Daily   Chlorhexidine Gluconate Cloth  6 each Topical Daily   cycloSPORINE  1 drop Both Eyes Daily   heparin  5,000 Units Subcutaneous Q8H   HYDROcodone-acetaminophen  1 tablet Oral Q6H   ibuprofen  600 mg Oral TID   levothyroxine  100 mcg Oral q morning   lidocaine  1 patch Transdermal Q24H   loratadine  10 mg Oral Daily   losartan  25 mg Oral Daily   melatonin   3 mg Oral QPM   multivitamin with minerals   Oral Daily   pantoprazole  40 mg Oral BID   polyethylene glycol  17 g Oral Daily   pravastatin  40 mg Oral q1800   senna-docusate  1 tablet Oral QHS   silver sulfADIAZINE   Topical Daily   sodium chloride flush  10-40 mL Intracatheter Q12H   sodium chloride flush  3 mL Intravenous Q12H   Continuous Infusions:  DAPTOmycin 500 mg (09/15/23 1116)   piperacillin-tazobactam (ZOSYN)  IV 3.375 g (09/15/23 0611)   PRN Meds:.acetaminophen, fluticasone, hydrALAZINE, labetalol, ondansetron (ZOFRAN) IV, promethazine, sodium chloride flush, sodium chloride flush    Objective: Weight change:   Intake/Output Summary (Last 24 hours) at 09/15/2023 1123 Last data filed at 09/15/2023 0000 Gross per 24 hour  Intake 824.23 ml  Output 300 ml  Net 524.23 ml    Blood pressure (!) 162/60, pulse 72, temperature 97.9 F (36.6 C), temperature source Oral, resp. rate 20, height 5' (1.524 m), weight 55.3 kg, SpO2 98%. Temp:  [  97.9 F (36.6 C)-98.1 F (36.7 C)] 97.9 F (36.6 C) (10/17 0838) Pulse Rate:  [66-74] 72 (10/17 0838) Resp:  [13-22] 20 (10/17 0838) BP: (123-162)/(45-101) 162/60 (10/17 0838) SpO2:  [97 %-100 %] 98 % (10/17 4401)  Physical Exam: Physical Exam HENT:     Head: Normocephalic and atraumatic.  Neurological:     General: No focal deficit present.     Mental Status: She is oriented to person, place, and time.  Psychiatric:        Mood and Affect: Mood normal.        Behavior: Behavior normal.        Thought Content: Thought content normal.        Judgment: Judgment normal.     She is having dual lumen PICC placed  CBC:    BMET Recent Labs    09/14/23 0818 09/15/23 0309  NA 140 139  K 4.0 4.1  CL 110 108  CO2 24 25  GLUCOSE 91 90  BUN 22 15  CREATININE 0.55 0.74  CALCIUM 8.5* 8.3*     Liver Panel  No results for input(s): "PROT", "ALBUMIN", "AST", "ALT", "ALKPHOS", "BILITOT", "BILIDIR", "IBILI" in the last 72  hours.     Sedimentation Rate No results for input(s): "ESRSEDRATE" in the last 72 hours. C-Reactive Protein No results for input(s): "CRP" in the last 72 hours.  Micro Results: Recent Results (from the past 720 hour(s))  Culture, blood (Routine x 2)     Status: None   Collection Time: 09/07/23  1:56 PM   Specimen: BLOOD  Result Value Ref Range Status   Specimen Description BLOOD LEFT ANTECUBITAL  Final   Special Requests   Final    BOTTLES DRAWN AEROBIC AND ANAEROBIC Blood Culture adequate volume   Culture   Final    NO GROWTH 5 DAYS Performed at [facility], [address], [place], [place] [address]    Report Status 09/12/2023 FINAL  Final  Culture, blood (Routine x 2)     Status: None   Collection Time: 09/07/23  2:01 PM   Specimen: BLOOD  Result Value Ref Range Status   Specimen Description BLOOD LEFT ANTECUBITAL  Final   Special Requests   Final    BOTTLES DRAWN AEROBIC ONLY Blood Culture adequate volume   Culture   Final    NO GROWTH 5 DAYS Performed at [facility], [address] [place], [place] [address]    Report Status 09/12/2023 FINAL  Final    Studies/Results: [place] EKG SITE RITE  Result Date: 09/14/2023 If Site Rite image not attached, placement could not be confirmed due to current cardiac rhythm.     Assessment/Plan:  INTERVAL HISTORY: tolerated zosyn without problems  Principal Problem:   Osteomyelitis of right tibia [facility]) Active Problems:   Chronic pain disorder   Hypertension   Hyperlipemia   Aortic valve stenosis   Anemia   Atrial fibrillation (HCC)   Peripheral arterial disease (HCC)   LLE Cellulitis   RLE Wound  R Heel Ulcer    Isabel Barnes is a 82 y.o. female with  tibial osteomyelitis, as well as early calcaneal osteomyelitis, PVD, right Prosthetic knee  # Osteomyelitis of tibia, calcaneous   Diagnosis: Left tibial and calcaneal osteomyelitis  Culture Result: NONE  Allergies  Allergen Reactions    Dilaudid [Hydromorphone Hcl] Shortness Of Breath   Gabapentin Other (See Comments)    Hoarseness , headache and sore throat   Latex  Rash    Severe rash   Lyrica [Pregabalin] Other (See Comments)    No balance , had to walk with cane , Blurred vision,weakness.   Oxycodone Shortness Of Breath and Other (See Comments)    Tolerated oxycodone during admission to hospital 02/06/22   Singulair [Montelukast] Shortness Of Breath and Other (See Comments)    Vision issues, also   Ciprofloxacin Hcl Nausea And Vomiting and Other (See Comments)    Nausea and vomiting with by mouth form   Codeine Other (See Comments)    Hallucinations   Methadone Nausea And Vomiting and Other (See Comments)    Severe nausea and vomiting   Metronidazole Nausea And Vomiting and Other (See Comments)    Gastric pain   Oysters [Shellfish Allergy] Other (See Comments)    "Terrible gastric upset and cramping."   Clindamycin/Lincomycin Diarrhea and Nausea Only   Donepezil Diarrhea and Other (See Comments)    Severe diarrhea   Sulfa Antibiotics Diarrhea and Other (See Comments)    GI issues    Tape Other (See Comments)    ADHESIVE TAPE-Severe rash   Zetia [Ezetimibe] Diarrhea   Elemental Sulfur Nausea And Vomiting   Iodine Rash   Other Rash    All Antibiotic ointments/ creams   Oyster Shell Rash   Penicillin G Nausea And Vomiting and Rash    Patient tolerates IV piperacillin/tazobactam. Has some history of n/v with amoxicillin/penicillin orally   Povidone Iodine Rash and Other (See Comments)    Oyster shell products- Rash    Skintegrity Hydrogel [Skin Protectants, Misc.] Rash   Tapentadol Other (See Comments)    Nightmares **Nucynta**    OPAT Orders Discharge antibiotics to be given via PICC line Discharge antibiotics:    Regimen: Daptomycin 500 mg IV daily and Zosyn 13.5 mg IV continuous infusion every day End date: 10/24/23 at 230PM with Dr. Daiva Eves  [facility] Care Per Protocol:  Home health RN for IV  administration and teaching; PICC line care and labs.    Labs weekly while on IV antibiotics: _x_ CBC with differential __ BMP __ CMP _x_ CRP _x_ ESR __ Vancomycin trough _x_ CK  _x_ Please pull PIC at completion of IV antibiotics __ Please leave PIC in place until doctor has seen patient or been notified  Fax weekly labs to [phone]  Clinic Follow Up Appt:   Isabel Barnes has an appointment on  at  [facility], which  is located in the [facility] at  [address] in [place].  [address], which is located to the left of the elevators.  Phone: [phone]  Fax: [phone]  [email or web address]  The patient should arrive 30 minutes prior to their appoitment.  I have personally spent 50 minutes involved in face-to-face and non-face-to-face activities for this patient on the day of the visit. Professional time spent includes the following activities: Preparing to see the patient (review of tests), Obtaining and/or reviewing separately obtained history (admission/discharge record), Performing a medically appropriate examination and/or evaluation , Ordering medications/tests/procedures, referring and communicating with other health care professionals, Documenting clinical information in the EMR, Independently interpreting results (not separately reported), Communicating results to the patient/family/caregiver, Counseling and educating the patient/family/caregiver and Care coordination (not separately reported).   Will sign off for now, please call with further questions.     LOS: 8 days   Acey Lav 09/15/2023, 11:23 AM

## 2023-09-15 NOTE — Assessment & Plan Note (Deleted)
-   Continue home ferrous sulfate 325 mg daily PO

## 2023-09-15 NOTE — Assessment & Plan Note (Deleted)
HR wnl.  - Continue to monitor vitals and symptoms

## 2023-09-15 NOTE — Assessment & Plan Note (Deleted)
-   Cont home Norco 10-325 Q6H and can further titrate depending on response with this and below interventions - Cont to Tylenol dosing 650 mg TID  - Cont Ibuprofen 600 mg TID - Cont Lidocaine patches daily

## 2023-09-15 NOTE — Progress Notes (Signed)
Daily Progress Note Intern Pager: [phone]  Patient name: Isabel Barnes Medical record number: [id number] Date of birth: [birth year] Age: 82 y.o. Gender: female  Primary Care Provider: Jamal Collin, PA-C Consultants: Orthopedic surgery, ID Code Status: DNR   Pt Overview and Major Events to Date:  10/09: Admitted to FMTS 10/14: Angiogram   Assessment and Plan: Isabel Barnes is a 82 yo female with past medical history of peripheral arterial disease, chronic venous insufficiency, CVA (remote R cerebellar infarct incidentally noted in 01/2022), HTN, hypothyroidism, and depression who presented here with chronic R heel ulcer, R leg wound, and LLE cellulitis.    Patient had PICC line placed this morning and is awaiting further IV antibiotic education with friend/caretaker who will assist her with this at home.  Medically stable and appropriate for discharge from our perspective. Assessment & Plan Osteomyelitis of right tibia [facility]) Patient has elected for prolonged IV antibiotic therapy.  Infectious disease is following; trialed Zosyn overnight, and will continue as below.  PICC line placed this morning; ongoing IV antibiotic education. -Continuing vancomycin IV at this time - Add daptomycin to regimen -Dual-lumen PICC per ID RLE Wound  R Heel Ulcer Pt desires prolonged course of IV abx as above. - Abx as above LLE Cellulitis Remains afebrile. Bcx collected 10/9 NGTD. - Antibiotic treatment as above   Chronic and Stable Conditions: HTN: Holding home Losartan due to soft BPs Hypothyroidism: Continue home Synthroid 50 mcg GERD: Continue Pantoprazole 40 mg BID Insomnia: Continue Melatonin 3 mg nightly  Peripheral arterial disease: Continue ASA Chronic pain disorder: Continue home Norco 10-3 25 every 6 hours scheduled and titrate as needed.  Tylenol 650 mg 3 times daily, ibuprofen 600 mg 3 times daily, lidocaine patches daily. Atrial fibrillation: Heart rate continues to  be within normal limits.  Continue to monitor vitals and symptoms. Hypertension: Continue home losartan 25 mg daily. Anemia: Continue home ferrous sulfate 325 mg daily Hyperlipidemia: Continue pravastatin 40 mg.  Goal LDL <70. Aortic valve stenosis     FEN/GI: Regular diet  PPx: Place SCDs, heparin Dispo: Ultimately home pending clinical improvement, outpatient IV antibiotics coordination.  Subjective:  Patient seen lying in bed resting.  She had PICC line placed earlier today.  She is comfortable and denies any concerns at this time.  Objective: Temp:  [98 F (36.7 C)-98.3 F (36.8 C)] 98.1 F (36.7 C) (10/17 0418) Pulse Rate:  [66-90] 66 (10/16 2315) Resp:  [13-22] 13 (10/17 0418) BP: (123-163)/(45-101) 134/56 (10/17 0418) SpO2:  [96 %-100 %] 97 % (10/17 0418) Physical Exam: General: Appears tired, pleasant, no acute distress Cardiovascular: RRR Respiratory: CTA bilaterally.  On room air. Abdomen: Soft, nontender Extremities: PICC line present in right UE, with minor bruising.  Moves all extremities equally.  Laboratory: Most recent CBC Lab Results  Component Value Date   WBC 6.4 09/15/2023   HGB 8.6 (L) 09/15/2023   HCT 26.9 (L) 09/15/2023   MCV 88.5 09/15/2023   PLT 277 09/15/2023   Most recent BMP    Latest Ref Rng & Units 09/15/2023    3:09 AM  BMP  Glucose 70 - 99 mg/dL 90   BUN 8 - 23 mg/dL 15   Creatinine 9.51 - 1.00 mg/dL 8.84   Sodium 166 - 063 mmol/L 139   Potassium 3.5 - 5.1 mmol/L 4.1   Chloride 98 - 111 mmol/L 108   CO2 22 - 32 mmol/L 25   Calcium 8.9 - 10.3 mg/dL 8.3  Isabel Skeeters, DO 09/15/2023, 7:20 AM  PGY-1, [facility] Family Medicine FPTS Intern pager: [phone], text pages welcome Secure chat group [facility] Teaching Service

## 2023-09-15 NOTE — Assessment & Plan Note (Addendum)
Remains afebrile. Bcx collected 10/9 NGTD. - Antibiotic treatment as above

## 2023-09-15 NOTE — Assessment & Plan Note (Deleted)
-   Continue to monitor vitals and symptoms

## 2023-09-15 NOTE — Assessment & Plan Note (Signed)
Pt desires prolonged course of IV abx as above. - Abx as above

## 2023-09-15 NOTE — Assessment & Plan Note (Addendum)
Patient has elected for prolonged IV antibiotic therapy.  Infectious disease is following; trialed Zosyn overnight, and will continue as below.  PICC line placed this morning; ongoing IV antibiotic education. -Continuing vancomycin IV at this time - Add daptomycin to regimen -Dual-lumen PICC per ID

## 2023-09-15 NOTE — Assessment & Plan Note (Deleted)
LDL 103. Goal <70, especially in setting of PAD. -Continue pravastatin 40mg 

## 2023-09-16 ENCOUNTER — Encounter: Payer: Self-pay | Admitting: Internal Medicine

## 2023-09-16 ENCOUNTER — Other Ambulatory Visit (HOSPITAL_COMMUNITY): Payer: Self-pay

## 2023-09-16 ENCOUNTER — Telehealth: Payer: Self-pay

## 2023-09-16 MED ORDER — PIPERACILLIN-TAZOBACTAM IV (FOR PTA / DISCHARGE USE ONLY): 13.5000 g | INTRAVENOUS | 0 refills | Status: DC

## 2023-09-16 MED ORDER — HEPARIN SOD (PORK) LOCK FLUSH 100 UNIT/ML IV SOLN
250.0000 [IU] | INTRAVENOUS | Status: AC | PRN
  Administered 2023-09-16: 250 [IU]

## 2023-09-16 MED ORDER — ASPIRIN 81 MG PO TBEC
81.0000 mg | DELAYED_RELEASE_TABLET | Freq: Every day | ORAL | 12 refills | Status: AC
  Filled 2023-09-16: qty 30, 30d supply, fill #0

## 2023-09-16 MED ORDER — PRAVASTATIN SODIUM 40 MG PO TABS
40.0000 mg | ORAL_TABLET | Freq: Every day | ORAL | 0 refills | Status: DC
  Filled 2023-09-16: qty 30, 30d supply, fill #0

## 2023-09-16 MED ORDER — LEVOTHYROXINE SODIUM 100 MCG PO TABS
100.0000 ug | ORAL_TABLET | Freq: Every morning | ORAL | 0 refills | Status: DC
  Filled 2023-09-16: qty 30, 30d supply, fill #0

## 2023-09-16 MED ORDER — DAPTOMYCIN IV (FOR PTA / DISCHARGE USE ONLY): 500.0000 mg | INTRAVENOUS | 0 refills | Status: DC

## 2023-09-16 MED ORDER — LORATADINE 10 MG PO TABS
10.0000 mg | ORAL_TABLET | Freq: Every day | ORAL | 0 refills | Status: DC
  Filled 2023-09-16: qty 30, 30d supply, fill #0

## 2023-09-16 MED ORDER — HEPARIN SOD (PORK) LOCK FLUSH 100 UNIT/ML IV SOLN: 250.0000 [IU] | INTRAVENOUS | Status: DC | PRN

## 2023-09-16 NOTE — Telephone Encounter (Signed)
There are no documented calls from the pt's daughter last week at all to our office. The daughter did call on 09/13/2023 and Dr. Lajoyce Corners spoke with her and advised of situation and that he recommend that pt has BKA and pt's daughter declined at that time and wanted to do IV ABX. Below is a second call to the pt's daughter today after she advised nursing that we have not been addressing questions for a week. Pt's daughter still wants to proceed with IV ABX does not wish to consider surgery at this time.    Nadara Mustard, MD sent to Randa Spike, Erlene Quan, RMA I called daughter a second time, and reviewed the surgery and discussed that IV antibiotics would not resolve the osteomyelitis in her tibia that she would be on 6 weeks of IV antibiotics and then oral antibiotics afterwards.  Discussed that patient is a high risk of developing sepsis from chronic infection.  Patient's daughter wishes to proceed with IV antibiotics at this time.       Previous Messages    ----- Message ----- From: Candida Peeling, RN Sent: 09/16/2023  11:10 AM EDT To: Nadara Mustard, MD Subject: treatment plan questions                      Received an email from patient's daughter Jacki Cones who lives in [place] (6hr ahead in time)- she indicated that she has tried to reach out to you and left msg at your office requesting return call- below is what she wrote to me:  "I have several questions about the IV treatment process (can it last more than six weeks?) and the possible leg amputation process.  I have left several messages asking Dr. Lajoyce Corners (or someone from his office who knows Mom's condition and care plan) to call me back (first was left last Tuesday), but no one ever does.  I know people are busy, but this entire process is very serious and we, the family, are trying to understand all that is involved.  Mom cannot tell us as she cannot remember what was said to her 5 minutes before."  If you or someone from the office that knows  this patient could please give daughter a call she would really appreciate that- thank you for advance for following up on this matter. Her contact number is [phone]   Donn Pierini RNCM [address] [facility]

## 2023-09-16 NOTE — Assessment & Plan Note (Addendum)
Remains afebrile. Bcx collected 10/9 NGTD. - Antibiotic treatment as above

## 2023-09-16 NOTE — Discharge Instructions (Signed)
Dear Isabel Barnes,   Thank you for letting us participate in your care! In this section, you will find a brief hospital admission summary of why you were admitted to the hospital, what happened during your admission, your diagnosis/diagnoses, and recommended follow up.  Primary diagnosis: Osteomyelitis Treatment plan: You are going home with a PICC line for prolonged IV antibiotics.  You or your caretaker will give these medicines as you have been instructed in the hospital. Follow-up with the infectious disease doctor, Dr. Daiva Eves, on 10/24/2023.  If you have any questions or concerns sooner please contact his office at [phone].   POST-HOSPITAL & CARE INSTRUCTIONS We recommend following up with your PCP within 1 week from being discharged from the hospital. Please let PCP/Specialists know of any changes in medications that were made which you will be able to see in the medications section of this packet.  DOCTOR'S APPOINTMENTS & FOLLOW UP Future Appointments  Date Time Provider Department Center  09/26/2023 10:45 AM Nadara Mustard, MD [facility] None  09/30/2023 11:00 AM Daria Pastures, MD [facility]  10/24/2023  2:30 PM Daiva Eves, Lisette Grinder, MD [facility]     Thank you for choosing [facility]! Take care and be well!  Family Medicine Teaching Service Inpatient Team [place]  [facility]  [address] [place], [place] [address] [phone]

## 2023-09-16 NOTE — Discharge Summary (Addendum)
Family Medicine Teaching [facility] Discharge Summary  Patient name: Isabel Barnes Medical record number: [id number] Date of birth: 12/12/1940 Age: 82 y.o. Gender: female Date of Admission: 09/07/2023  Date of Discharge: 09/16/2023 Admitting Physician: Cyndia Skeeters, DO  Primary Care Provider: Jamal Collin, PA-C Consultants: Infectious disease, orthopedics  Indication for Hospitalization: IV antibiotics for chronic right heel ulcer  Brief Hospital Course:  Isabel Barnes is an 82 yo female with past medical history of peripheral arterial disease, chronic venous insufficiency, CVA (remote R cerebellar infarct incidentally noted in 01/2022), HTN, hypothyroidism, and depression who presents here with chronic R heel ulcer, R leg wound, and LLE cellulitis. She was admitted to [facility] to the [facility] Medicine Teaching Service on 10/09. 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 Failed outpatient course of cefadroxil with increasing pain, here for IV Abx at recommendation of Wound Care. Afebrile with no systemic symptoms. Prior cultures during hospitalization for similar in 2023 with E. Faecalis and Bacteroides. Continued on Vanc, Cefepime, and Flagyl given prior wound cultures. Blood cultures remained negative. MRI of RLE with evidence of chronic tibial cortical bone infection, no acute osteomyelitis. VVS completed arteriogram 10/14 after abnormal ABIs; this revealed grossly patent vessels. Ortho consulted who offered BKA for definitive treatment versus prolonged course of outpatient IV antibiotics.  Patient elected for IV antibiotic treatment. ID recommended IV daptomycin and zosyn daily via PICC line until 11/25. She was discharged in stable condition with close follow up.  LLE Cellulitis LLE cellulitis of the L ankle  and shin. Hx of venous insufficiency and significant stasis dermatitis. Afebrile during admission with no leukocytosis or sick symptoms. Antibiotic regimen as above. Blood cultures remained negative.   Peripheral arterial disease (HCC) Hx of extensive PAD with chronic claudication symptoms. 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 Patient not taking ASA and Crestor. VVS consulted with interventions as above. She was continued on and discharged with ASA.  Anemia Hgb 10.6 on admission, downtrended to 8.4. Remained stable between 8-10 during remainder of admission. No signs/symptoms consistent with active bleed during admission. History of IDA and prior hospitalization with anemia during which endoscopy was recommended to her, patient declined at that time. Iron studies during stay with evidence of IDA. Patient continued on home oral iron at discharge.  Atrial fibrillation (HCC) History of paroxysmal atrial fibrillation, presented in 2023 during hospitalization in Afib with spontaneous conversion. Placed on amiodarone 200 mg BID in 2023, but per patient, medication discontinued for unknown reason. HDS during admission with no symptoms. EKG showed NSR.   Aortic valve stenosis Systolic murmur noted on cardiopulmonary exam, patient reports this has been present for years, followed outpatient. Echo in 2023 with normal LVEF, and significant aortic stenosis with aortic valve calcification. Asymptomatic during admission.   Chronic pain disorder History of chronic arthritic pain, home regimen includes Tylenol 1g TID as well as Norco 10-325 q6h, patient taking q4h at home.  Attempted to wean opioids; however, ultimately resumed home regimen along with Tylenol, ibuprofen, and lidocaine patches to ensure control of pain by discharge.  AKI (acute kidney injury) (HCC) Cr of 1.16 on admission with baseline around  0.7. Improved to 0.74. Patient asymptomatic during admission. Did not require IV fluids.  Other  chronic conditions were medically managed with home medications and formulary alternatives as necessary: (Hypertension (home Losartan held 2/2 soft BPs), Hypothyroidism, GERD, Insomnia)  PCP Follow Up Recommendations: Recommend outpatient follow up for continued management of atrial fibrillation, peripheral arterial disease Recommend further anemia workup in outpatient setting  Ensure continued wound healing and pain control Recommend outpatient BMP to monitor renal function Will need weekly CK while on daptomycin Ensure follow up with ID, [facility]  Discharge Diagnoses/Problem List:  Principal Problem:   Osteomyelitis of right tibia [facility]) Active Problems:   Chronic pain disorder   Hypertension   Hyperlipemia   Aortic valve stenosis   Anemia   Atrial fibrillation (HCC)   Peripheral arterial disease (HCC)   LLE Cellulitis   RLE Wound  R Heel Ulcer    Disposition: Home  Discharge Condition: Stable  Discharge Exam:  General: Alert, pleasant, no acute distress Cardiovascular: RRR Respiratory: Normal work of breathing on room air Abdomen: Soft, nontender Extremities: Moves all extremities equally  Significant Procedures:  10/14: Angiogram -showed grossly patent vessels, no intervention was undertaken  Significant Labs and Imaging:  Recent Labs  Lab 09/15/23 0309  WBC 6.4  HGB 8.6*  HCT 26.9*  PLT 277   Recent Labs  Lab 09/15/23 0309  NA 139  K 4.1  CL 108  CO2 25  GLUCOSE 90  BUN 15  CREATININE 0.74  CALCIUM 8.3*    Results/Tests Pending at Time of Discharge: None  Discharge Medications:  Allergies as of 09/16/2023       Reactions   Dilaudid [hydromorphone Hcl] Shortness Of Breath   Gabapentin Other (See Comments)   Hoarseness , headache and sore throat   Latex Rash   Severe rash   Lyrica [pregabalin] Other (See Comments)   No balance , had to walk with cane  , Blurred vision,weakness.   Oxycodone Shortness Of Breath, Other (See Comments)   Tolerated oxycodone during admission to hospital 02/06/22   Singulair [montelukast] Shortness Of Breath, Other (See Comments)   Vision issues, also   Ciprofloxacin Hcl Nausea And Vomiting, Other (See Comments)   Nausea and vomiting with by mouth form   Codeine Other (See Comments)   Hallucinations   Methadone Nausea And Vomiting, Other (See Comments)   Severe nausea and vomiting   Metronidazole Nausea And Vomiting, Other (See Comments)   Gastric pain   Oysters [shellfish Allergy] Other (See Comments)   "Terrible gastric upset and cramping."   Clindamycin/lincomycin Diarrhea, Nausea Only   Donepezil Diarrhea, Other (See Comments)   Severe diarrhea   Sulfa Antibiotics Diarrhea, Other (See Comments)   GI issues   Tape Other (See Comments)   ADHESIVE TAPE-Severe rash   Zetia [ezetimibe] Diarrhea   Elemental Sulfur Nausea And Vomiting   Iodine Rash   Other Rash   All Antibiotic ointments/ creams   Oyster Shell Rash   Penicillin G Nausea And Vomiting, Rash   Patient tolerates IV piperacillin/tazobactam. Has some history of n/v with amoxicillin/penicillin orally   Povidone Iodine Rash, Other (See Comments)   Oyster shell products- Rash   Skintegrity Hydrogel [skin Protectants, Misc.] Rash   Tapentadol Other (See Comments)   Nightmares **Nucynta**        Medication List     STOP taking these medications    carboxymethylcellulose 0.5 % Soln Commonly known as: REFRESH PLUS   hydrOXYzine 25 MG tablet Commonly known as: ATARAX   melatonin 5 MG Tabs   methocarbamol 500 MG tablet Commonly known as:  ROBAXIN   omeprazole 20 MG capsule Commonly known as: PRILOSEC   ondansetron 8 MG tablet Commonly known as: Zofran   prochlorperazine 5 MG tablet Commonly known as: COMPAZINE       TAKE these medications    acetaminophen 500 MG tablet Commonly known as: TYLENOL Take 2 tablets (1,000  mg total) by mouth 3 (three) times daily. What changed:  when to take this reasons to take this   aspirin EC 81 MG tablet Take 1 tablet (81 mg total) by mouth daily. Swallow whole. Start taking on: September 17, 2023 What changed: additional instructions   cholecalciferol 1000 units tablet Commonly known as: VITAMIN D Take 1,000 Units by mouth daily.   daptomycin IVPB Commonly known as: CUBICIN Inject 500 mg into the vein daily. Indication:  Tibia osteomyelitis First Dose: Yes Last Day of Therapy:  10/24/23 Labs - Once weekly:  CBC/D, BMP, and CPK Labs - Once weekly: ESR and CRP Method of administration: IV Push Method of administration may be changed at the discretion of home infusion pharmacist based upon assessment of the patient and/or caregiver's ability to self-administer the medication ordered.   diclofenac sodium 1 % Gel Commonly known as: VOLTAREN Apply 2 g topically daily as needed (for pain).   Ensure Plus Liqd Take 237 mLs by mouth daily.   Enzyme Digest Caps Take 1 capsule by mouth daily as needed (For digestive).   ferrous sulfate 325 (65 FE) MG tablet Take 1 tablet (325 mg total) by mouth daily with breakfast.   fish oil-omega-3 fatty acids 1000 MG capsule Take 1,000 mg by mouth 2 (two) times daily.   HYDROcodone-acetaminophen 10-325 MG tablet Commonly known as: NORCO Take 1 tablet by mouth in the morning, at noon, and at bedtime.   K2 Plus D3 [phone] MCG-UNIT Tabs Take 1 tablet by mouth daily.   levothyroxine 100 MCG tablet Commonly known as: SYNTHROID Take 1 tablet (100 mcg total) by mouth every morning. Start taking on: September 17, 2023 What changed:  medication strength how much to take Another medication with the same name was removed. Continue taking this medication, and follow the directions you see here.   loperamide 2 MG tablet Commonly known as: IMODIUM A-D Take 4 mg by mouth 3 (three) times daily as needed for diarrhea or loose  stools.   loratadine 10 MG tablet Commonly known as: CLARITIN Take 1 tablet (10 mg total) by mouth daily.   losartan 25 MG tablet Commonly known as: COZAAR Take 1 tablet (25 mg total) by mouth daily. What changed: how much to take   MULTIVITAMIN PO Take 1 tablet by mouth daily.   piperacillin-tazobactam IVPB Commonly known as: ZOSYN Inject 13.5 g into the vein daily. As a continuous infusion Indication:  Tibia osteomyelitis First Dose: Yes Last Day of Therapy:  10/24/23 Labs - Once weekly:  CBC/D and BMP, Labs - Once weekly: ESR and CRP Method of administration: Elastomeric (Continuous infusion) Method of administration may be changed at the discretion of home infusion pharmacist based upon assessment of the patient and/or caregiver's ability to self-administer the medication ordered.   polyethylene glycol 17 g packet Commonly known as: MIRALAX / GLYCOLAX Take 17 g by mouth daily.   pravastatin 40 MG tablet Commonly known as: PRAVACHOL Take 1 tablet (40 mg total) by mouth daily at 6 PM.   PROBIOTIC ACIDOPHILUS PO Take 1 capsule by mouth daily.   Restasis 0.05 % ophthalmic emulsion Generic drug: cycloSPORINE Place 1 drop into both  eyes daily.   sertraline 50 MG tablet Commonly known as: ZOLOFT Take 50 mg by mouth daily as needed (depression, sleep).   silver sulfADIAZINE 1 % cream Commonly known as: SILVADENE Apply 1 Application topically 2 (two) times daily.   tetrahydrozoline 0.05 % ophthalmic solution Place 1 drop into both eyes 4 (four) times daily.   triamcinolone 55 MCG/ACT Aero nasal inhaler Commonly known as: NASACORT Place 1-2 sprays into the nose 2 (two) times daily as needed (for seasonal allergies).   Turmeric 500 MG Caps Take 1 capsule by mouth at bedtime.               Discharge Care Instructions  (From admission, onward)           Start     Ordered   09/16/23 0000  Change dressing on IV access line weekly and PRN  (Home infusion  instructions - [facility] )        09/16/23 1033            Discharge Instructions: Please refer to Patient Instructions section of EMR for full details.  Patient was counseled important signs and symptoms that should prompt return to medical care, changes in medications, dietary instructions, activity restrictions, and follow up appointments.   Follow-Up Appointments:  Follow-up Information     [facility] Follow up.   Specialty: Rehabilitation Why: Please follow up at the [facility] location for OUtpatient rehabilitation services after completing home IV abx and [facility] Contact information: [address] [place] [address] [phone]        [facility] Follow up.   Why: HH/PT arranged- they will contact you to schedule (Rn will make weekly visits for PICC line care and labs- support for home IV abx needs)- PT will schedule visits seperately Contact information: [address] [place], [place] [address]  Phone: [phone]        [facility] Follow up.   Why: Home IV abx arranged- they will contact for education prior to discharge and schedule home delivery for abx needs. Contact information: [address], [place], [place] [address]  Phone: [phone]        [facility] Follow up.   Why: HHRN- as arranged by daughter for home IV abx needs (PICC line care/labs)                Cyndia Skeeters, DO 09/16/2023, 1:00 PM PGY-1, [facility] Family Medicine   I agree with the assessment and plan as documented in the resident's note.  Janeal Holmes, MD PGY-2, [facility] Family Medicine

## 2023-09-16 NOTE — Assessment & Plan Note (Addendum)
Pt desires prolonged course of IV abx as above. - Abx as above

## 2023-09-16 NOTE — TOC Transition Note (Signed)
Transition of Care (TOC) - CM/SW Discharge Note Donn Pierini RN, BSN Transitions of Care Unit 4E- RN Case Manager See Treatment Team for direct phone #   Patient Details  Name: Isabel Barnes MRN: [id number] Date of Birth: 11/16/1941  Transition of Care [facility]) CM/SW Contact:  Darrold Span, RN Phone Number: 09/16/2023, 1:44 PM   Clinical Narrative:    Pt stable for transition home today, CM has received communication from daughter via email- confirmed plan is for pt to return home with [facility] services- daughter has worked things out with [employer] for private pay [facility] needs to assist on days that caregiver is not in the home for daily IV abx administration. Caregiver will be here around 1pm for bedside education with [facility] liaison- Per conversation with Pam (liaison) she will be bringing home meds with her to hook pt up to for transition home.   HHPT has been set up with [facility]- HH orders modified. [facility] will follow for abx needs and coordinate with [facility] for RN needs- PICC line care and labs.   Caregiver to transport home.   No further TOC needs noted, per daughter [place] 2-3 [place].  Per daughter request msg sent to Dr. Lajoyce Corners requesting call/communication.    Final next level of care: Home w Home Health Services Barriers to Discharge: Barriers Resolved   Patient Goals and CMS Choice CMS Medicare.gov Compare Post Acute Care list provided to:: Patient Choice offered to / list presented to : Patient  Discharge Placement               Home w/ [facility]          Discharge Plan and Services Additional resources added to the After Visit Summary for     Discharge Planning Services: CM Consult Post Acute Care Choice: Resumption of Svcs/PTA Provider          DME Arranged: N/A DME Agency: NA       HH Arranged: RN, PT, IV Antibiotics HH Agency: Other - See comment, [facility]  ([facility]), [facility] Date HH Agency Contacted: 09/15/23 Time HH Agency Contacted: 1100 Representative spoke with at [facility] Agency: Ashley/Pam  Social Determinants of Health (SDOH) Interventions SDOH Screenings   Food Insecurity: No Food Insecurity (05/02/2023)   Received from [employer] Needs: No Transportation Needs (05/02/2023)   Received from [facility]  Utilities: Not At Risk (05/02/2023)   Received from [facility]  Financial Resource Strain: Low Risk  (05/02/2023)   Received from [facility]  Physical Activity: Unknown (05/02/2023)   Received from [facility]  Social Connections: Moderately Integrated (05/02/2023)   Received from [facility]  Stress: No Stress Concern Present (05/02/2023)   Received from [facility]  Tobacco Use: Medium Risk (09/08/2023)     Readmission Risk Interventions    09/16/2023    1:44 PM 09/09/2023    3:51 PM  Readmission Risk Prevention Plan  Post Dischage Appt  Complete  Medication Screening  Complete  Transportation Screening Complete Complete  PCP or Specialist Appt within 5-7 Days Complete   Home Care Screening Complete   Medication Review (RN CM) Complete

## 2023-09-16 NOTE — Assessment & Plan Note (Signed)
Patient has elected for prolonged IV antibiotic therapy. PICC line placed; ongoing IV antibiotic education.  ID has given regimen and signed off.  Patient will follow-up with Dr. Daiva Eves 11/25. -Antibiotic regimen: daptomycin 500 mg IV daily and Zosyn 13.5 mg IV continuous infusion every day

## 2023-09-16 NOTE — Progress Notes (Signed)
Occupational Therapy Treatment Patient Details Name: Isabel Barnes MRN: [id number] DOB: 1941/09/02 Today's Date: 09/16/2023   History of present illness 82 yo female presents to [facility] on 10/9 on recommendation from ortho for non-healing R foot infection, has been treated with abx. PMH includes HTN, HLD, chronic venous insufficiency.   OT comments  OT session focused on ADLs. OT educated pt in energy conservation strategies and techniques for increased safety and independence with ADLs and functional mobility/transfers with a RW. Pt verbalized and demonstrated understanding of training through teach back. Pt reports concern regarding bowel urgency due to medication. OT educated pt in option of placing BSC next to bed in home to decrease the need to rush and decrease risk of falls with pt reporting understanding and agreement. Pt largely completing ADLs with Mod I with Supervision needed for functional mobility/transfer with a RW due to IV pole management. Pt has met or is making good progress toward goals. Pt planned to discharge home today. No post acute OT follow up is indicated at this time. If pt does not discharge home as planned, pt will benefit from continued acute skilled OT services to maximize rehab potential.       If plan is discharge home, recommend the following:  Assistance with cooking/housework;Help with stairs or ramp for entrance;Assist for transportation   Equipment Recommendations  None recommended by OT (Pt already has needed equipment.)    Recommendations for Other Services      Precautions / Restrictions Precautions Precautions: Fall Restrictions Weight Bearing Restrictions: No       Mobility Bed Mobility Overal bed mobility: Independent                  Transfers Overall transfer level: Needs assistance Equipment used: Rolling walker (2 wheels) Transfers: Sit to/from Stand, Bed to chair/wheelchair/BSC Sit to Stand: Modified independent  (Device/Increase time)     Step pivot transfers: Supervision     General transfer comment: supervision provided only for IV pole management. Pt requires no physical assist for sit to stand or functional transfers. No VC for hand placement with RW management.     Balance Overall balance assessment: Needs assistance Sitting-balance support: No upper extremity supported, Feet supported Sitting balance-Leahy Scale: Good Sitting balance - Comments: sitting EOB   Standing balance support: Single extremity supported, Bilateral upper extremity supported, During functional activity Standing balance-Leahy Scale: Fair Standing balance comment: with RW support                           ADL either performed or assessed with clinical judgement   ADL Overall ADL's : Needs assistance/impaired Eating/Feeding: Independent;Sitting   Grooming: Oral care;Brushing hair;Modified independent;Standing   Upper Body Bathing: Set up;Sitting   Lower Body Bathing: Supervison/ safety;Sitting/lateral leans;Sit to/from stand   Upper Body Dressing : Modified independent;Sitting   Lower Body Dressing: Modified independent;Sit to/from stand;Sitting/lateral leans   Toilet Transfer: Supervision/safety;Rolling walker (2 wheels);Ambulation;Regular Toilet;Grab bars   Toileting- Clothing Manipulation and Hygiene: Modified independent;Sit to/from stand;Sitting/lateral lean       Functional mobility during ADLs: Supervision/safety;Rolling walker (2 wheels) General ADL Comments: Pt with decreased activity tolerance during functional tasks. OT instructed pt in energy conservation strategies this session will pt verbalizing and demonstrating understanding through teach back.    Extremity/Trunk Assessment Upper Extremity Assessment Upper Extremity Assessment: Generalized weakness   Lower Extremity Assessment Lower Extremity Assessment: Defer to PT evaluation        Vision  Perception      Praxis      Cognition Arousal: Alert Behavior During Therapy: WFL for tasks assessed/performed Overall Cognitive Status: Within Functional Limits for tasks assessed                                 General Comments: AAOx4 and pleasant throughout. Able to follow 1 and 2 step incstructions consistently. Mild short term memory deficits noted during session.        Exercises      Shoulder Instructions       General Comments VSS on RA throughout session. Pt's caregiver present throughout session. RN and MD present during a portion of session.    Pertinent Vitals/ Pain       Pain Assessment Pain Assessment: Faces Faces Pain Scale: Hurts little more Pain Location: head and neck, B feet Pain Descriptors / Indicators: Discomfort, Aching, Guarding Pain Intervention(s): Limited activity within patient's tolerance, Monitored during session, Repositioned  Home Living                                          Prior Functioning/Environment              Frequency  Min 1X/week        Progress Toward Goals  OT Goals(current goals can now be found in the care plan section)  Progress towards OT goals: Progressing toward goals  Acute Rehab OT Goals Patient Stated Goal: to return home and be as independent as possible  Plan      Co-evaluation                 AM-PAC OT "6 Clicks" Daily Activity     Outcome Measure   Help from another person eating meals?: None Help from another person taking care of personal grooming?: None Help from another person toileting, which includes using toliet, bedpan, or urinal?: A Little Help from another person bathing (including washing, rinsing, drying)?: A Little Help from another person to put on and taking off regular upper body clothing?: None Help from another person to put on and taking off regular lower body clothing?: None 6 Click Score: 22    End of Session Equipment Utilized During  Treatment: Rolling walker (2 wheels)  OT Visit Diagnosis: Other abnormalities of gait and mobility (R26.89);Pain;Muscle weakness (generalized) (M62.81);Unsteadiness on feet (R26.81)   Activity Tolerance Patient tolerated treatment well   Patient Left in chair;with call bell/phone within reach;with family/visitor present   Nurse Communication Mobility status;Other (comment) (Pt with questions regarding medication.)        Time: 1610-9604 OT Time Calculation (min): 38 min  Charges: OT General Charges $OT Visit: 1 Visit OT Treatments $Self Care/Home Management : 38-52 mins  Daysha Ashmore "Orson Eva., OTR/L, MA Acute Rehab [phone]   Lendon Colonel 09/16/2023, 12:28 PM

## 2023-09-16 NOTE — Progress Notes (Signed)
Daily Progress Note Intern Pager: [phone]  Patient name: Isabel Barnes Medical record number: [id number] Date of birth: 05-15-1941 Age: 82 y.o. Gender: female  Primary Care Provider: Jamal Collin, PA-C Consultants: Orthopedic surgery, ID Code Status: DNR   Pt Overview and Major Events to Date:  10/09: Admitted to FMTS 10/14: Angiogram   Assessment and Plan: Isabel Barnes is a 82 yo female with past medical history of peripheral arterial disease, chronic venous insufficiency, CVA (remote R cerebellar infarct incidentally noted in 01/2022), HTN, hypothyroidism, and depression who presented here with chronic R heel ulcer, R leg wound, and LLE cellulitis.   Patient has PICC line placed and is awaiting further IV antibiotic education for herself and caretaker.  She is medically stable and appropriate for discharge from our perspective once this is completed. Assessment & Plan Osteomyelitis of right tibia [facility]) Patient has elected for prolonged IV antibiotic therapy. PICC line placed; ongoing IV antibiotic education.  ID has given regimen and signed off.  Patient will follow-up with Dr. Daiva Eves 11/25. -Antibiotic regimen: daptomycin 500 mg IV daily and Zosyn 13.5 mg IV continuous infusion every day RLE Wound  R Heel Ulcer Pt desires prolonged course of IV abx as above. - Abx as above LLE Cellulitis Remains afebrile. Bcx collected 10/9 NGTD. - Antibiotic treatment as above  Chronic and Stable Conditions: HTN: Holding home Losartan due to soft BPs Hypothyroidism: Continue home Synthroid 50 mcg GERD: Continue Pantoprazole 40 mg BID Insomnia: Continue Melatonin 3 mg nightly  Peripheral arterial disease: Continue ASA Chronic pain disorder: Continue home Norco 10-3 25 every 6 hours scheduled and titrate as needed.  Tylenol 650 mg 3 times daily, ibuprofen 600 mg 3 times daily, lidocaine patches daily. Atrial fibrillation: Heart rate continues to be within normal limits.   Continue to monitor vitals and symptoms. Hypertension: Continue home losartan 25 mg daily. Anemia: Continue home ferrous sulfate 325 mg daily Hyperlipidemia: Continue pravastatin 40 mg.  Goal LDL <70. Aortic valve stenosis     FEN/GI: Regular diet  PPx: Place SCDs, heparin Dispo: Ultimately home pending clinical improvement, outpatient IV antibiotics coordination.  Subjective:  Patient seen this morning sitting up bedside working with OT.  She did have a BM accident earlier.  No other bowel or bladder complaints.  Pain is well-controlled with current regimen.  Her home caretaker is present to continue PICC line education today prior to discharge.  Objective: Temp:  [97.8 F (36.6 C)-98.5 F (36.9 C)] 98.1 F (36.7 C) (10/18 0821) Pulse Rate:  [64-71] 69 (10/18 0821) Resp:  [11-17] 15 (10/18 0821) BP: (108-165)/(43-59) 151/56 (10/18 0821) SpO2:  [98 %] 98 % (10/18 0506) Physical Exam: General: Alert, pleasant, no acute distress Cardiovascular: RRR Respiratory: Normal work of breathing on room air Abdomen: Soft, nontender Extremities: Moves all extremities equally  Laboratory: Most recent CBC Lab Results  Component Value Date   WBC 6.4 09/15/2023   HGB 8.6 (L) 09/15/2023   HCT 26.9 (L) 09/15/2023   MCV 88.5 09/15/2023   PLT 277 09/15/2023   Most recent BMP    Latest Ref Rng & Units 09/15/2023    3:09 AM  BMP  Glucose 70 - 99 mg/dL 90   BUN 8 - 23 mg/dL 15   Creatinine 3.66 - 1.00 mg/dL 4.40   Sodium 347 - 425 mmol/L 139   Potassium 3.5 - 5.1 mmol/L 4.1   Chloride 98 - 111 mmol/L 108   CO2 22 - 32 mmol/L 25  Calcium 8.9 - 10.3 mg/dL 8.3     Cyndia Skeeters, DO 09/16/2023, 11:54 AM  PGY-1, [facility] FPTS Intern pager: [phone], text pages welcome Secure chat group [facility] Teaching Service

## 2023-09-17 NOTE — Plan of Care (Signed)
CHL Tonsillectomy/Adenoidectomy, Postoperative PEDS care plan entered in error.

## 2023-09-23 ENCOUNTER — Telehealth: Payer: Self-pay | Admitting: Orthopedic Surgery

## 2023-09-23 ENCOUNTER — Telehealth (HOSPITAL_COMMUNITY): Payer: Self-pay | Admitting: Radiology

## 2023-09-23 ENCOUNTER — Telehealth: Payer: Self-pay

## 2023-09-23 NOTE — Telephone Encounter (Signed)
Waiting on MD orders. He is in surgery right now. Once a get them I will fax to [facility].

## 2023-09-23 NOTE — Telephone Encounter (Signed)
Pt's dtr, Jacki Cones had called the triage phone earlier today that the answering service called [place] about. I have sent him that message in regards of wound care orders. There were none on her discharge paperwork. Once replied back, I will send fax of the orders to [facility]. Jacki Cones has called several times today. I am waiting for response. Dr. Lajoyce Corners is in surgery today. She was made aware of this.

## 2023-09-23 NOTE — Telephone Encounter (Signed)
Patients daughter Jacki Cones had called concerning wound care orders for patient.  CB# [phone].  Please advise.

## 2023-09-23 NOTE — Telephone Encounter (Signed)
Faxed to [facility]

## 2023-09-23 NOTE — Telephone Encounter (Signed)
Daughter Lawson Fiscal called answering service, states that the patient was discharged for [facility] on 09/17/23 without woundcare instructions/orders.  Please call Lawson Fiscal back to advise on this at [phone]

## 2023-09-23 NOTE — Telephone Encounter (Signed)
Please send wound care orders to [facility] via Fax at [phone]

## 2023-09-26 ENCOUNTER — Ambulatory Visit: Payer: Medicare Other | Admitting: Orthopedic Surgery

## 2023-09-26 DIAGNOSIS — L97912 Non-pressure chronic ulcer of unspecified part of right lower leg with fat layer exposed: Secondary | ICD-10-CM

## 2023-09-26 DIAGNOSIS — I87332 Chronic venous hypertension (idiopathic) with ulcer and inflammation of left lower extremity: Secondary | ICD-10-CM

## 2023-09-26 DIAGNOSIS — I890 Lymphedema, not elsewhere classified: Secondary | ICD-10-CM | POA: Diagnosis not present

## 2023-09-27 ENCOUNTER — Encounter: Payer: Self-pay | Admitting: Orthopedic Surgery

## 2023-09-27 NOTE — Progress Notes (Deleted)
[name], female   DOB: 07-02-1941, 82 y.o.   MRN: [id number]  Reason for Consult: No chief complaint on file.   Referred by Terance Hart, MD  Subjective:     HPI  Isabel Barnes is a 82 y.o. female who presents for evaluation of a slow healing right lower extremity wound. Recently underwent angiography which noted 1 vessel runoff via peroneal with reconstitution of the DP and plantar arteries but without a lesion to intervene. ***  Timeframe: *** Symptoms: *** Varicosities: *** Previous wounds: *** Previous DVT: *** In compression: ***  Past Medical History:  Diagnosis Date   AKI (acute kidney injury) (HCC) 09/08/2023   Anemia    iron deficiency hx.has had iron infusions before    Chronic low back pain    Chronic pain disorder    Complication of anesthesia    severe claustrophobia   Constipation    r/t use of pain meds.Takes OTC meds or eats prunes   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HCC)    Unsteady gait    occasionally   Urinary urgency    Family History  Problem Relation Age of Onset   Stroke Father    Past Surgical History:  Procedure Laterality Date   ABDOMINAL AORTOGRAM W/LOWER EXTREMITY N/A  02/15/2022   Procedure: ABDOMINAL AORTOGRAM W/LOWER EXTREMITY;  Surgeon: Maeola Harman, MD;  Location: [facility] INVASIVE CV LAB;  Service: Cardiovascular;  Laterality: N/A;   ABDOMINAL AORTOGRAM W/LOWER EXTREMITY N/A 09/12/2023   Procedure: ABDOMINAL AORTOGRAM W/LOWER EXTREMITY;  Surgeon: Victorino Sparrow, MD;  Location: [facility] INVASIVE CV LAB;  Service: Cardiovascular;  Laterality: N/A;   ABDOMINAL HYSTERECTOMY     ANTERIOR FUSION CERVICAL SPINE     x2 -C4-7   APPLICATION OF WOUND VAC Right 03/12/2022   Procedure: APPLICATION OF WOUND VAC;  Surgeon: Nadara Mustard, MD;  Location: [facility] OR;  Service: Orthopedics;  Laterality: Right;   BUNIONECTOMY Bilateral    COLONOSCOPY     CYSTOSCOPY W/ URETEROSCOPY  2012   EYE SURGERY Bilateral    cataract /lens implant   HOLMIUM LASER APPLICATION Left 02/08/2013   Procedure: HOLMIUM LASER APPLICATION;  Surgeon: Anner Crete, MD;  Location: [facility];  Service: Urology;  Laterality: Left;   I & D EXTREMITY Right 02/10/2022   Procedure: DEBRIDEMENT RIGHT LEG ABSCESS;  Surgeon: Nadara Mustard, MD;  Location: [facility] OR;  Service: Orthopedics;  Laterality: Right;   I & D EXTREMITY Right 03/12/2022   Procedure:  RIGHT LEG IRRIGATION AND DEBRIDEMENT EXTREMITY;  Surgeon: Nadara Mustard, MD;  Location: [facility] OR;  Service: Orthopedics;  Laterality: Right;   INSERTION OF MESH N/A 07/15/2014   Procedure: INSERTION OF MESH;  Surgeon: Ardeth Sportsman, MD;  Location: [facility] OR;  Service: General;  Laterality: N/A;   JOINT REPLACEMENT Right 2012   shoulder   LAPAROSCOPIC CHOLECYSTECTOMY W/ CHOLANGIOGRAPHY  2012   Dr Magnus Ivan   NASAL SINUS SURGERY     OPEN REDUCTION INTERNAL FIXATION (ORIF) DISTAL RADIAL FRACTURE Left 03/20/2021   Procedure: OPEN REDUCTION INTERNAL FIXATION (ORIF) DISTAL RADIAL FRACTURE;  Surgeon: Teryl Lucy, MD;  Location: [facility] OR;  Service: Orthopedics;  Laterality: Left;   RADIOLOGY WITH ANESTHESIA N/A 05/09/2014   Procedure: ADULT SEDATION WITH  ANESTHESIA/MRI CERVICAL SPINE WITHOUT CONTRAST;  Surgeon: Medication Radiologist, MD;  Location: [facility] OR;  Service: Radiology;  Laterality: N/A;  DR. HAWKS/MRI   right knee arthroscopy     d/t meniscal tear   SHOULDER ARTHROSCOPY W/ ROTATOR CUFF REPAIR Bilateral three times each over several yrs   SKIN FULL THICKNESS GRAFT Right 03/12/2022   Procedure: SKIN GRAFT FULL THICKNESS;  Surgeon: Nadara Mustard, MD;  Location: [facility] OR;  Service: Orthopedics;  Laterality: Right;   SKIN SPLIT GRAFT Right 02/17/2022   Procedure: RIGHT LEG SKIN GRAFT;  Surgeon: Nadara Mustard, MD;  Location: [facility] OR;  Service: Orthopedics;  Laterality: Right;   THUMB ARTHROSCOPY Left    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   Procedure: RIGHT TOTAL KNEE ARTHROPLASTY;  Surgeon: Jodi Geralds, MD;  Location: [facility] OR;  Service: Orthopedics;  Laterality: Right;   UMBILICAL HERNIA REPAIR N/A 07/15/2014   Procedure: LAPAROSCOPIC UMBILICAL AND INFRAUMBILICAL HERNIA;  Surgeon: Ardeth Sportsman, MD;  Location: [facility] OR;  Service: General;  Laterality: N/A;    Short Social History:  Social History   Tobacco Use   Smoking status: Former    Current packs/day: 0.00    Average packs/day: 0.5 packs/day for 2.0 years (1.0 ttl pk-yrs)    Types: Cigarettes    Start date: 02/06/1990    Quit date: 02/07/1992    Years since quitting: 31.6   Smokeless tobacco: Never  Substance Use Topics   Alcohol use: No    Allergies  Allergen Reactions   Dilaudid [Hydromorphone Hcl] Shortness Of Breath   Gabapentin Other (See Comments)    Hoarseness , headache and sore throat   Latex Rash    Severe rash   Lyrica [Pregabalin] Other (See Comments)    No balance , had to walk with cane , Blurred vision,weakness.   Oxycodone Shortness Of Breath and Other (See Comments)    Tolerated oxycodone during admission to hospital 02/06/22   Singulair [Montelukast] Shortness Of Breath and Other (See Comments)    Vision issues, also   Ciprofloxacin Hcl Nausea And  Vomiting and Other (See Comments)    Nausea and vomiting with by mouth form   Codeine Other (See Comments)    Hallucinations   Methadone Nausea And Vomiting and Other (See Comments)    Severe nausea and vomiting   Metronidazole Nausea And Vomiting and Other (See Comments)    Gastric pain   Oysters [Shellfish Allergy] Other (See Comments)    "Terrible gastric upset and cramping."   Clindamycin/Lincomycin Diarrhea and Nausea Only   Donepezil Diarrhea and Other (See Comments)    Severe diarrhea   Sulfa Antibiotics Diarrhea and Other (See Comments)    GI issues    Tape Other (See  Comments)    ADHESIVE TAPE-Severe rash   Zetia [Ezetimibe] Diarrhea   Elemental Sulfur Nausea And Vomiting   Iodine Rash   Other Rash    All Antibiotic ointments/ creams   Oyster Shell Rash   Penicillin G Nausea And Vomiting and Rash    Patient tolerates IV piperacillin/tazobactam. Has some history of n/v with amoxicillin/penicillin orally   Povidone Iodine Rash and Other (See Comments)    Oyster shell products- Rash    Skintegrity Hydrogel [Skin Protectants, Misc.] Rash   Tapentadol Other (See Comments)    Nightmares **Nucynta**    Current Outpatient Medications  Medication Sig Dispense Refill   acetaminophen (TYLENOL) 500 MG tablet Take 2 tablets (1,000 mg total) by mouth 3 (three) times daily. (Patient taking differently: Take 1,000 mg by mouth daily as needed for moderate pain.) 180 tablet 0   aspirin EC 81 MG tablet Take 1 tablet (81 mg total) by mouth daily. Swallow whole. 30 tablet 12   cholecalciferol (VITAMIN D) 1000 UNITS tablet Take 1,000 Units by mouth daily.     daptomycin (CUBICIN) IVPB Inject 500 mg into the vein daily. Indication:  Tibia osteomyelitis First Dose: Yes Last Day of Therapy:  10/24/23 Labs - Once weekly:  CBC/D, BMP, and CPK Labs - Once weekly: ESR and CRP Method of administration: IV Push Method of administration may be changed at the discretion of home infusion  pharmacist based upon assessment of the patient and/or caregiver's ability to self-administer the medication ordered. 39 Units 0   diclofenac sodium (VOLTAREN) 1 % GEL Apply 2 g topically daily as needed (for pain).     Digestive Enzymes (ENZYME DIGEST) CAPS Take 1 capsule by mouth daily as needed (For digestive). 30 capsule 0   ENSURE PLUS (ENSURE PLUS) LIQD Take 237 mLs by mouth daily.     ferrous sulfate 325 (65 FE) MG tablet Take 1 tablet (325 mg total) by mouth daily with breakfast. (Patient not taking: Reported on 09/07/2023) 30 tablet 0   fish oil-omega-3 fatty acids 1000 MG capsule Take 1,000 mg by mouth 2 (two) times daily.      HYDROcodone-acetaminophen (NORCO) 10-325 MG tablet Take 1 tablet by mouth in the morning, at noon, and at bedtime.     Lactobacillus (PROBIOTIC ACIDOPHILUS PO) Take 1 capsule by mouth daily.     levothyroxine (SYNTHROID) 100 MCG tablet Take 1 tablet (100 mcg total) by mouth every morning. 30 tablet 0   loperamide (IMODIUM A-D) 2 MG tablet Take 4 mg by mouth 3 (three) times daily as needed for diarrhea or loose stools.     loratadine (CLARITIN) 10 MG tablet Take 1 tablet (10 mg total) by mouth daily. 30 tablet 0   losartan (COZAAR) 25 MG tablet Take 1 tablet (25 mg total) by mouth daily. (Patient taking differently: Take 100 mg by mouth daily.) 30 tablet 0   Multiple Vitamins-Minerals (MULTIVITAMIN PO) Take 1 tablet by mouth daily.     piperacillin-tazobactam (ZOSYN) IVPB Inject 13.5 g into the vein daily. As a continuous infusion Indication:  Tibia osteomyelitis First Dose: Yes Last Day of Therapy:  10/24/23 Labs - Once weekly:  CBC/D and BMP, Labs - Once weekly: ESR and CRP Method of administration: Elastomeric (Continuous infusion) Method of administration may be changed at the discretion of home infusion pharmacist based upon assessment of the patient and/or caregiver's ability to self-administer the medication ordered. 39 Units 0   polyethylene glycol  (MIRALAX / GLYCOLAX) 17 g packet Take 17  g by mouth daily. (Patient not taking: Reported on 09/07/2023) 14 each 2   pravastatin (PRAVACHOL) 40 MG tablet Take 1 tablet (40 mg total) by mouth daily at 6 PM. 30 tablet 0   RESTASIS 0.05 % ophthalmic emulsion Place 1 drop into both eyes daily. 0.4 mL 0   sertraline (ZOLOFT) 50 MG tablet Take 50 mg by mouth daily as needed (depression, sleep).     silver sulfADIAZINE (SILVADENE) 1 % cream Apply 1 Application topically 2 (two) times daily.     tetrahydrozoline 0.05 % ophthalmic solution Place 1 drop into both eyes 4 (four) times daily.     triamcinolone (NASACORT) 55 MCG/ACT AERO nasal inhaler Place 1-2 sprays into the nose 2 (two) times daily as needed (for seasonal allergies). 1 each 3   Turmeric 500 MG CAPS Take 1 capsule by mouth at bedtime.     Vitamin D-Vitamin K (K2 PLUS D3) [phone] MCG-UNIT TABS Take 1 tablet by mouth daily.     No current facility-administered medications for this visit.    REVIEW OF SYSTEMS  Negative other than noted in HPI     Objective:  Objective   There were no vitals filed for this visit. There is no height or weight on file to calculate BMI.  Physical Exam General: no acute distress Cardiac: hemodynamically stable Pulm: normal work of breathing Neuro: alert, no focal deficit Extremities: *** Vascular:   Right:   Left:    Data: Reflux study ***  ABI +---------+------------------+-----+----------+---------------------+  Right   Rt Pressure (mmHg)IndexWaveform  Comment                +---------+------------------+-----+----------+---------------------+  Brachial 154                    triphasic                        +---------+------------------+-----+----------+---------------------+  PTA                            monophasicnon compressible >255  +---------+------------------+-----+----------+---------------------+  DP      216               1.37 monophasic                        +---------+------------------+-----+----------+---------------------+  Great Toe40                0.25 Abnormal                         +---------+------------------+-----+----------+---------------------+   +---------+------------------+-----+---------+-------+  Left    Lt Pressure (mmHg)IndexWaveform Comment  +---------+------------------+-----+---------+-------+  Brachial 158                    triphasicaudibly  +---------+------------------+-----+---------+-------+  PTA     235               1.49                   +---------+------------------+-----+---------+-------+  DP      183               1.16                   +---------+------------------+-----+---------+-------+  Great Toe132               0.84 Normal            +---------+------------------+-----+---------+-------+  Right leg angiogram Aortogram: Widely patent renal arteries.  No flow-limiting stenosis appreciated in the infrarenal aorta, bilateral iliac system On the right: Common femoral artery widely patent, profunda widely patent, superficial femoral artery widely patent.  There is mild disease in the popliteal artery.  Distally, single-vessel peroneal artery runoff filling the foot via medial and lateral perforators.  Reconstitution of the dorsalis pedis and plantar arteries.      Assessment/Plan:     Isabel Barnes is a 82 y.o. female with PAD and chronic venous insufficiency with C*** disease and reflux noted in *** I explained the foundation of CVI treatment of compression and elevation. Recent angiogram did not demonstrate any intervenable stenosis.  I recommended continuing with local wound care and light compression      Daria Pastures MD [facility]

## 2023-09-27 NOTE — Progress Notes (Signed)
Office Visit Note   Patient: Isabel Barnes           Date of Birth: 30-Oct-1941           MRN: [id number] Visit Date: 09/26/2023              Requested by: Jamal Collin, PA-C [address] [place],  [place] [address] PCP: Jamal Collin, PA-C  Chief Complaint  Patient presents with   Right Leg - Wound Check, Follow-up      HPI: Patient is an 82 year old woman who presents in follow-up for chronic ulceration right lower extremity patient studies have shown osteomyelitis deep to the ulcer right leg.  Patient also complains of a painful ulcer on the right heel.  Patient is currently undergoing compression wraps daily she is on IV antibiotics.  Assessment & Plan: Visit Diagnoses:  1. Lymphedema   2. 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 Chronic ulcer of right leg, with fat layer exposed (HCC)     Plan: Again reviewed treatment options with recommendation for a above-knee amputation on the right.  Patient's family is having further discussions regarding surgical intervention.  Follow-Up Instructions: No follow-ups on file.   Ortho Exam  Patient is alert, oriented, no adenopathy, well-dressed, normal affect, normal respiratory effort. Examination patient has a purulent draining ulcer to the right heel with bone exposed.  She has a chronic right leg ulcer with surrounding cellulitis with MRI scan showing deep osteomyelitis.  She is on probiotics.  On IV antibiotics with a PICC line.  Imaging: No results found. No images are attached to the encounter.  Labs: Lab Results  Component Value Date   ESRSEDRATE 40 (H) 02/07/2022   CRP 20.5 (H) 02/07/2022   REPTSTATUS 09/12/2023 FINAL 09/07/2023   GRAMSTAIN  02/10/2022    RARE WBC PRESENT, PREDOMINANTLY PMN NO ORGANISMS SEEN    CULT  09/07/2023    NO GROWTH 5 DAYS Performed at [facility], [address], [place], [place] [address]    [facility]  ENTEROCOCCUS FAECALIS 02/10/2022     Lab Results  Component Value Date   ALBUMIN 3.5 09/07/2023   ALBUMIN 1.5 (L) 02/09/2022   ALBUMIN 2.1 (L) 02/06/2022    Lab Results  Component Value Date   MG 2.0 02/12/2022   MG 1.8 02/07/2022   Lab Results  Component Value Date   VD25OH 69.88 02/07/2022    No results found for: "PREALBUMIN"    Latest Ref Rng & Units 09/15/2023    3:09 AM 09/14/2023    8:18 AM 09/13/2023    4:59 AM  CBC EXTENDED  WBC 4.0 - 10.5 K/uL 6.4  6.5  8.6   RBC 3.87 - 5.11 MIL/uL 3.04  3.12  3.36   Hemoglobin 12.0 - 15.0 g/dL 8.6  8.8  9.7   HCT 56.2 - 46.0 % 26.9  27.2  29.0   Platelets 150 - 400 K/uL 277  284  264      There is no height or weight on file to calculate BMI.  Orders:  No orders of the defined types were placed in this encounter.  No orders of the defined types were placed in this encounter.    Procedures: No procedures performed  Clinical Data: No additional findings.  ROS:  All other systems negative, except as noted in the HPI. Review of Systems  Objective: Vital Signs: There were no vitals taken for  this visit.  Specialty Comments:  No specialty comments available.  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   RLE Wound  R Heel Ulcer 09/08/2023   LLE Cellulitis 09/07/2023   Abscess of right lower leg    Severe protein-calorie malnutrition (HCC)    Peripheral arterial disease (HCC)    Fall    Atrial fibrillation (HCC)    Goals of care, counseling/discussion    Anemia    Fluid collection (edema) in the arms, legs, hands and feet    Cellulitis of right lower extremity 02/06/2022   Chest pain, rule out acute myocardial infarction 03/16/2021   Hypertension 03/16/2021   Hypothyroidism 03/16/2021   Hyperlipemia 03/16/2021   Aortic valve stenosis 03/16/2021   Chronic venous insufficiency of lower extremity 03/16/2021   Short-term memory loss  12/13/2019   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   Primary osteoarthritis of right knee 07/16/2016   Eosinophilia 06/16/2015   Chronic pain disorder    Osteoarthritis    Incisional umbilical hernia, without obstruction or gangrene    Iron deficiency anemia 09/19/2012   Past Medical History:  Diagnosis Date   AKI (acute kidney injury) (HCC) 09/08/2023   Anemia    iron deficiency hx.has had iron infusions before    Chronic low back pain    Chronic pain disorder    Complication of anesthesia    severe claustrophobia   Constipation    r/t use of pain meds.Takes OTC meds or eats prunes   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HCC)    Unsteady gait    occasionally   Urinary urgency     Family History  Problem Relation Age of Onset   Stroke Father     Past Surgical History:  Procedure Laterality Date   ABDOMINAL AORTOGRAM W/LOWER EXTREMITY N/A 02/15/2022   Procedure: ABDOMINAL AORTOGRAM W/LOWER EXTREMITY;  Surgeon: Maeola Harman, MD;  Location: [facility] INVASIVE CV LAB;  Service: Cardiovascular;  Laterality: N/A;   ABDOMINAL AORTOGRAM W/LOWER EXTREMITY N/A 09/12/2023   Procedure: ABDOMINAL AORTOGRAM W/LOWER EXTREMITY;  Surgeon: Victorino Sparrow, MD;  Location: [facility] INVASIVE CV LAB;  Service: Cardiovascular;  Laterality: N/A;   ABDOMINAL HYSTERECTOMY     ANTERIOR FUSION CERVICAL SPINE     x2 -C4-7   APPLICATION OF WOUND VAC Right 03/12/2022   Procedure: APPLICATION OF WOUND VAC;  Surgeon: Nadara Mustard, MD;  Location: [facility] OR;  Service: Orthopedics;  Laterality: Right;   BUNIONECTOMY Bilateral  COLONOSCOPY     CYSTOSCOPY W/ URETEROSCOPY  2012   EYE SURGERY Bilateral    cataract /lens implant   HOLMIUM LASER APPLICATION Left 02/08/2013   Procedure: HOLMIUM LASER APPLICATION;  Surgeon: Anner Crete, MD;  Location: [facility];  Service: Urology;  Laterality: Left;   I & D EXTREMITY Right 02/10/2022   Procedure: DEBRIDEMENT RIGHT LEG ABSCESS;  Surgeon: Nadara Mustard, MD;  Location: [facility] OR;  Service: Orthopedics;  Laterality: Right;   I & D EXTREMITY Right 03/12/2022   Procedure: RIGHT LEG IRRIGATION AND DEBRIDEMENT EXTREMITY;  Surgeon: Nadara Mustard, MD;  Location: [facility] OR;  Service: Orthopedics;  Laterality: Right;   INSERTION OF MESH N/A 07/15/2014   Procedure: INSERTION OF MESH;  Surgeon: Ardeth Sportsman, MD;  Location: [facility] OR;  Service: General;  Laterality: N/A;   JOINT REPLACEMENT Right 2012   shoulder   LAPAROSCOPIC CHOLECYSTECTOMY W/ CHOLANGIOGRAPHY  2012   Dr Magnus Ivan   NASAL SINUS SURGERY     OPEN REDUCTION INTERNAL FIXATION (ORIF) DISTAL RADIAL FRACTURE Left 03/20/2021   Procedure: OPEN REDUCTION INTERNAL FIXATION (ORIF) DISTAL RADIAL FRACTURE;  Surgeon: Teryl Lucy, MD;  Location: [facility] OR;  Service: Orthopedics;  Laterality: Left;   RADIOLOGY WITH ANESTHESIA N/A 05/09/2014   Procedure: ADULT SEDATION WITH ANESTHESIA/MRI CERVICAL SPINE WITHOUT CONTRAST;  Surgeon: Medication Radiologist, MD;  Location: [facility] OR;  Service: Radiology;  Laterality: N/A;  DR. HAWKS/MRI   right knee arthroscopy     d/t meniscal tear   SHOULDER ARTHROSCOPY W/ ROTATOR CUFF REPAIR Bilateral three times each over several yrs   SKIN FULL  THICKNESS GRAFT Right 03/12/2022   Procedure: SKIN GRAFT FULL THICKNESS;  Surgeon: Nadara Mustard, MD;  Location: [facility] OR;  Service: Orthopedics;  Laterality: Right;   SKIN SPLIT GRAFT Right 02/17/2022   Procedure: RIGHT LEG SKIN GRAFT;  Surgeon: Nadara Mustard, MD;  Location: [facility] OR;  Service: Orthopedics;  Laterality: Right;   THUMB ARTHROSCOPY Left    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   Procedure: RIGHT TOTAL KNEE ARTHROPLASTY;  Surgeon: Jodi Geralds, MD;  Location: [facility] OR;  Service: Orthopedics;  Laterality: Right;   UMBILICAL HERNIA REPAIR N/A 07/15/2014   Procedure: LAPAROSCOPIC UMBILICAL AND INFRAUMBILICAL HERNIA;  Surgeon: Ardeth Sportsman, MD;  Location: [facility] OR;  Service: General;  Laterality: N/A;   Social History   Occupational History   Not on file  Tobacco Use   Smoking status: Former    Current packs/day: 0.00    Average packs/day: 0.5 packs/day for 2.0 years (1.0 ttl pk-yrs)    Types: Cigarettes    Start date: 02/06/1990    Quit date: 02/07/1992    Years since quitting: 31.6   Smokeless tobacco: Never  Vaping Use   Vaping status: Never Used  Substance and Sexual Activity   Alcohol use: No   Drug use: No   Sexual activity: Not on file

## 2023-09-28 ENCOUNTER — Telehealth: Payer: Self-pay

## 2023-09-28 NOTE — Telephone Encounter (Signed)
Patient daughter called requesting results from labs done on 10/22. I have placed labs in Dr.Van Dam box and will call her back once he reviews labs. Callback number [phone].   Pamla Pangle Lesli Albee, CMA

## 2023-09-30 ENCOUNTER — Ambulatory Visit: Payer: Medicare Other | Admitting: Vascular Surgery

## 2023-09-30 DIAGNOSIS — I872 Venous insufficiency (chronic) (peripheral): Secondary | ICD-10-CM

## 2023-09-30 DIAGNOSIS — I739 Peripheral vascular disease, unspecified: Secondary | ICD-10-CM

## 2023-09-30 DIAGNOSIS — I10 Essential (primary) hypertension: Secondary | ICD-10-CM

## 2023-10-03 ENCOUNTER — Other Ambulatory Visit: Payer: Self-pay | Admitting: *Deleted

## 2023-10-03 DIAGNOSIS — I872 Venous insufficiency (chronic) (peripheral): Secondary | ICD-10-CM

## 2023-10-03 DIAGNOSIS — I739 Peripheral vascular disease, unspecified: Secondary | ICD-10-CM

## 2023-10-05 ENCOUNTER — Telehealth: Payer: Self-pay

## 2023-10-05 NOTE — Telephone Encounter (Signed)
Attempted to call patient's daughter to ask how many BM a day patient is having. LVM for her to call back Originally when I spoke to the patient's daughter she was unsure how may BM patient was having a day.  Will follow up.

## 2023-10-05 NOTE — Telephone Encounter (Signed)
Patient's daughter called stating patient has had increased diarrhea this week.  She states she is unsure if it is due to the IV ABX.  Patient not taking anything OTC for the diarrhea.  She would like to know if patient should be tested for CDIFF and what to do for the diarrhea.  Please advise Jaskirat Zertuche Jonathon Resides, CMA

## 2023-10-05 NOTE — Telephone Encounter (Signed)
Pt's daughter, Jacki Cones, called requesting pt's appts to be canceled. Pt was having lots of stomach issues and wouldn't be able to do the [place] on 11/8. She also asked about Hospice since the pt does not want the recommended amputation.  Reviewed pt's chart, returned call for clarification, two identifiers used. Informed her that the [place] and post-op appt with Dr. Karin Lieu was protocol after the AGM and those were canceled based off pt's wishes. Instructed her to call ID to inform them about the severe diarrhea the pt is having from antibiotics in case they need to check for C-diff or change treatment. Instructed her to talk to Dr. Lajoyce Corners concerning Hospice. Confirmed understanding.

## 2023-10-05 NOTE — Telephone Encounter (Signed)
Patient's daughter returned call stating patient was having 3 lose stools as of yesterday.  Today so far only one lose BM

## 2023-10-06 NOTE — Telephone Encounter (Signed)
Attempted to call patient's daughter. No answer LVM to call back.  Isabel Barnes Isabel Barnes, CMA

## 2023-10-07 ENCOUNTER — Encounter (HOSPITAL_COMMUNITY): Payer: Self-pay

## 2023-10-07 ENCOUNTER — Ambulatory Visit (HOSPITAL_COMMUNITY): Payer: Medicare Other

## 2023-10-12 NOTE — Telephone Encounter (Signed)
Patient's daughter reports the lose stools are not daily and has improved. Advised patient can take OTC antidiarrhea

## 2023-10-12 NOTE — Telephone Encounter (Signed)
2nd attempt to follow with patient's daughter no answer LVM

## 2023-10-18 ENCOUNTER — Encounter: Payer: Self-pay | Admitting: Infectious Disease

## 2023-10-24 ENCOUNTER — Ambulatory Visit: Payer: Medicare Other | Admitting: Infectious Disease

## 2023-10-24 ENCOUNTER — Encounter: Payer: Self-pay | Admitting: Orthopedic Surgery

## 2023-10-24 ENCOUNTER — Encounter: Payer: Self-pay | Admitting: Infectious Disease

## 2023-10-24 ENCOUNTER — Ambulatory Visit: Payer: Medicare Other | Admitting: Orthopedic Surgery

## 2023-10-24 ENCOUNTER — Other Ambulatory Visit: Payer: Self-pay

## 2023-10-24 VITALS — BP 161/84 | HR 73 | Resp 16 | Ht 60.0 in | Wt 122.0 lb

## 2023-10-24 DIAGNOSIS — M069 Rheumatoid arthritis, unspecified: Secondary | ICD-10-CM

## 2023-10-24 DIAGNOSIS — I890 Lymphedema, not elsewhere classified: Secondary | ICD-10-CM | POA: Diagnosis not present

## 2023-10-24 DIAGNOSIS — L97914 Non-pressure chronic ulcer of unspecified part of right lower leg with necrosis of bone: Secondary | ICD-10-CM | POA: Diagnosis not present

## 2023-10-24 DIAGNOSIS — M86261 Subacute osteomyelitis, right tibia and fibula: Secondary | ICD-10-CM

## 2023-10-24 DIAGNOSIS — I739 Peripheral vascular disease, unspecified: Secondary | ICD-10-CM

## 2023-10-24 MED ORDER — ONDANSETRON HCL 4 MG PO TABS: 4.0000 mg | ORAL_TABLET | Freq: Two times a day (BID) | ORAL | 3 refills | Status: DC

## 2023-10-24 MED ORDER — ONDANSETRON HCL 4 MG PO TABS: 4.0000 mg | ORAL_TABLET | Freq: Three times a day (TID) | ORAL | 3 refills | Status: DC | PRN

## 2023-10-24 MED ORDER — AMOXICILLIN-POT CLAVULANATE 875-125 MG PO TABS: 1.0000 | ORAL_TABLET | Freq: Two times a day (BID) | ORAL | 5 refills | Status: DC

## 2023-10-24 NOTE — Progress Notes (Signed)
Subjective:  Chief complaint: follow-up for osteomyelitis   [name], female    DOB: September 12, 1941, 82 y.o.   MRN: [id number]  HPI  82 y.o. female with  tibial osteomyelitis, as well as early calcaneal osteomyelitis, PVD, right Prosthetic knee, DC on daptomycin and zosyn.  She has who said that the wound wasAnd Dr. Lajoyce Corners today improving but reinforced to the patient andher daughter  antibiotics would in no means by no means cure infection.  They were wanting to extend IV antibiotics but I do not see a good reason for this.    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HCC)    Unsteady gait    occasionally   Urinary urgency     Past Surgical History:  Procedure Laterality Date   ABDOMINAL AORTOGRAM W/LOWER EXTREMITY N/A 02/15/2022   Procedure: ABDOMINAL AORTOGRAM W/LOWER EXTREMITY;  Surgeon: Maeola Harman, MD;  Location: [facility] INVASIVE CV LAB;  Service: Cardiovascular;  Laterality: N/A;   ABDOMINAL AORTOGRAM W/LOWER EXTREMITY N/A 09/12/2023   Procedure: ABDOMINAL AORTOGRAM W/LOWER EXTREMITY;  Surgeon: Victorino Sparrow, MD;  Location: [facility] INVASIVE CV LAB;  Service: Cardiovascular;  Laterality: N/A;   ABDOMINAL HYSTERECTOMY     ANTERIOR FUSION CERVICAL SPINE     x2 -C4-7   APPLICATION OF WOUND VAC Right 03/12/2022   Procedure: APPLICATION OF WOUND VAC;  Surgeon: Nadara Mustard, MD;  Location: [facility] OR;  Service: Orthopedics;  Laterality: Right;   BUNIONECTOMY Bilateral    COLONOSCOPY     CYSTOSCOPY W/ URETEROSCOPY  2012   EYE SURGERY Bilateral    cataract /lens implant   HOLMIUM LASER APPLICATION Left 02/08/2013   Procedure: HOLMIUM LASER APPLICATION;  Surgeon: Anner Crete, MD;  Location: [facility];  Service: Urology;  Laterality: Left;   I & D EXTREMITY Right 02/10/2022   Procedure: DEBRIDEMENT RIGHT LEG ABSCESS;  Surgeon: Nadara Mustard, MD;  Location: [facility] OR;  Service: Orthopedics;  Laterality: Right;   I & D EXTREMITY Right 03/12/2022   Procedure: RIGHT LEG IRRIGATION AND DEBRIDEMENT EXTREMITY;  Surgeon: Nadara Mustard, MD;  Location:  [facility] OR;  Service: Orthopedics;  Laterality: Right;   INSERTION OF MESH N/A 07/15/2014   Procedure: INSERTION OF MESH;  Surgeon: Ardeth Sportsman, MD;  Location: [facility] OR;  Service: General;  Laterality: N/A;   JOINT REPLACEMENT Right 2012   shoulder   LAPAROSCOPIC CHOLECYSTECTOMY W/ CHOLANGIOGRAPHY  2012   Dr Magnus Ivan   NASAL SINUS SURGERY     OPEN REDUCTION INTERNAL FIXATION (ORIF) DISTAL RADIAL FRACTURE Left 03/20/2021   Procedure: OPEN REDUCTION INTERNAL FIXATION (ORIF) DISTAL RADIAL FRACTURE;  Surgeon: Teryl Lucy, MD;  Location: [facility] OR;  Service: Orthopedics;  Laterality: Left;   RADIOLOGY WITH ANESTHESIA N/A 05/09/2014   Procedure: ADULT SEDATION WITH ANESTHESIA/MRI CERVICAL SPINE WITHOUT CONTRAST;  Surgeon: Medication Radiologist, MD;   Location: [facility] OR;  Service: Radiology;  Laterality: N/A;  DR. HAWKS/MRI   right knee arthroscopy     d/t meniscal tear   SHOULDER ARTHROSCOPY W/ ROTATOR CUFF REPAIR Bilateral three times each over several yrs   SKIN FULL THICKNESS GRAFT Right 03/12/2022   Procedure: SKIN GRAFT FULL THICKNESS;  Surgeon: Nadara Mustard, MD;  Location: [facility] OR;  Service: Orthopedics;  Laterality: Right;   SKIN SPLIT GRAFT Right 02/17/2022   Procedure: RIGHT LEG SKIN GRAFT;  Surgeon: Nadara Mustard, MD;  Location: [facility] OR;  Service: Orthopedics;  Laterality: Right;   THUMB ARTHROSCOPY Left    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   Procedure: RIGHT TOTAL KNEE ARTHROPLASTY;  Surgeon: Jodi Geralds, MD;  Location: [facility] OR;  Service: Orthopedics;  Laterality: Right;   UMBILICAL HERNIA REPAIR N/A 07/15/2014   Procedure: LAPAROSCOPIC UMBILICAL AND INFRAUMBILICAL HERNIA;  Surgeon: Ardeth Sportsman, MD;  Location: [facility] OR;  Service: General;  Laterality: N/A;    Family History  Problem Relation Age of Onset   Stroke Father       Social History   Socioeconomic History   Marital status: Divorced    Spouse name: Not on file   Number of children: Not on file   Years of education: Not on file   Highest education level: Not on file  Occupational History   Not on file  Tobacco Use   Smoking status: Former    Current packs/day: 0.00    Average packs/day: 0.5 packs/day for 2.0 years (1.0 ttl pk-yrs)    Types: Cigarettes    Start date: 02/06/1990    Quit date: 02/07/1992    Years since quitting: 31.7   Smokeless tobacco: Never  Vaping Use   Vaping status: Never Used  Substance and Sexual Activity   Alcohol use: No   Drug use: No   Sexual activity: Not on file  Other Topics Concern   Not on file  Social History Narrative   Not on file   Social Determinants of Health   Financial Resource Strain: Low Risk  (05/02/2023)   Received from [facility]   Overall Financial Resource Strain (CARDIA)    Difficulty of Paying  Living Expenses: Not very hard  Food Insecurity: No Food Insecurity (05/02/2023)   Received from [facility]   Hunger Vital Sign    Worried About Running Out of Food in the Last Year: Never true    Ran Out of Food in the Last Year: Never true  Transportation Needs: No Transportation Needs (05/02/2023)   Received from [facility] - Transportation    Lack of Transportation (Medical): No    Lack of Transportation (Non-Medical): No  Physical Activity: Unknown (05/02/2023)   Received from  [facility]   Exercise Vital Sign    Days of Exercise per Week: 0 days    Minutes of Exercise per Session: Not on file  Stress: No Stress Concern Present (05/02/2023)   Received from [facility] - Occupational Stress Questionnaire    Feeling of Stress : Only a little  Social Connections: Moderately Integrated (05/02/2023)   Received from [facility]   Social Network    How would you rate your social network (family, work, friends)?: Adequate participation with social networks    Allergies  Allergen Reactions   Dilaudid [Hydromorphone Hcl] Shortness Of Breath   Gabapentin Other (See Comments)    Hoarseness , headache and sore throat   Latex Rash    Severe rash   Lyrica [Pregabalin] Other (See Comments)    No balance , had to walk with cane , Blurred vision,weakness.   Oxycodone Shortness Of Breath and Other (See Comments)    Tolerated oxycodone during admission to hospital 02/06/22   Singulair [Montelukast] Shortness Of Breath and Other (See Comments)    Vision issues, also   Ciprofloxacin Hcl Nausea And Vomiting and Other (See Comments)    Nausea and vomiting with by mouth form   Codeine Other (See Comments)    Hallucinations   Methadone Nausea And Vomiting and Other (See Comments)    Severe nausea and vomiting   Metronidazole Nausea And Vomiting and Other (See Comments)    Gastric pain   Oysters [Shellfish Allergy] Other (See Comments)     "Terrible gastric upset and cramping."   Clindamycin/Lincomycin Diarrhea and Nausea Only   Donepezil Diarrhea and Other (See Comments)    Severe diarrhea   Sulfa Antibiotics Diarrhea and Other (See Comments)    GI issues    Tape Other (See Comments)    ADHESIVE TAPE-Severe rash   Zetia [Ezetimibe] Diarrhea   Elemental Sulfur Nausea And Vomiting   Iodine Rash   Other Rash    All Antibiotic ointments/ creams   Oyster Shell Rash   Penicillin G Nausea And Vomiting and Rash    Patient tolerates IV piperacillin/tazobactam. Has some history of n/v with amoxicillin/penicillin orally   Povidone Iodine Rash and Other (See Comments)    Oyster shell products- Rash    Skintegrity Hydrogel [Skin Protectants, Misc.] Rash   Tapentadol Other (See Comments)    Nightmares **Nucynta**     Current Outpatient Medications:    acetaminophen (TYLENOL) 500 MG tablet, Take 2 tablets (1,000 mg total) by mouth 3 (three) times daily. (Patient taking differently: Take 1,000 mg by mouth daily as needed for moderate pain.), Disp: 180 tablet, Rfl: 0   aspirin EC 81 MG tablet, Take 1 tablet (81 mg total) by mouth daily. Swallow whole., Disp: 30 tablet, Rfl: 12   cholecalciferol (VITAMIN D) 1000 UNITS tablet, Take 1,000 Units by mouth daily., Disp: , Rfl:    daptomycin (CUBICIN) IVPB, Inject 500 mg into the vein daily. Indication:  Tibia osteomyelitis First Dose: Yes Last Day of Therapy:  10/24/23 Labs - Once weekly:  CBC/D, BMP, and CPK Labs - Once weekly: ESR and CRP Method of administration: IV Push Method of administration may be changed at the discretion of home infusion pharmacist based upon assessment of the patient and/or caregiver's ability to self-administer the medication ordered., Disp: 39 Units, Rfl: 0   diclofenac sodium (VOLTAREN) 1 % GEL, Apply 2 g topically daily as needed (for pain)., Disp: , Rfl:  Digestive Enzymes (ENZYME DIGEST) CAPS, Take 1 capsule by mouth daily as needed (For digestive).,  Disp: 30 capsule, Rfl: 0   ENSURE PLUS (ENSURE PLUS) LIQD, Take 237 mLs by mouth daily., Disp: , Rfl:    ferrous sulfate 325 (65 FE) MG tablet, Take 1 tablet (325 mg total) by mouth daily with breakfast. (Patient not taking: Reported on 09/07/2023), Disp: 30 tablet, Rfl: 0   fish oil-omega-3 fatty acids 1000 MG capsule, Take 1,000 mg by mouth 2 (two) times daily. , Disp: , Rfl:    HYDROcodone-acetaminophen (NORCO) 10-325 MG tablet, Take 1 tablet by mouth in the morning, at noon, and at bedtime., Disp: , Rfl:    Lactobacillus (PROBIOTIC ACIDOPHILUS PO), Take 1 capsule by mouth daily., Disp: , Rfl:    levothyroxine (SYNTHROID) 100 MCG tablet, Take 1 tablet (100 mcg total) by mouth every morning., Disp: 30 tablet, Rfl: 0   loperamide (IMODIUM A-D) 2 MG tablet, Take 4 mg by mouth 3 (three) times daily as needed for diarrhea or loose stools., Disp: , Rfl:    loratadine (CLARITIN) 10 MG tablet, Take 1 tablet (10 mg total) by mouth daily., Disp: 30 tablet, Rfl: 0   losartan (COZAAR) 25 MG tablet, Take 1 tablet (25 mg total) by mouth daily. (Patient taking differently: Take 100 mg by mouth daily.), Disp: 30 tablet, Rfl: 0   Multiple Vitamins-Minerals (MULTIVITAMIN PO), Take 1 tablet by mouth daily., Disp: , Rfl:    piperacillin-tazobactam (ZOSYN) IVPB, Inject 13.5 g into the vein daily. As a continuous infusion Indication:  Tibia osteomyelitis First Dose: Yes Last Day of Therapy:  10/24/23 Labs - Once weekly:  CBC/D and BMP, Labs - Once weekly: ESR and CRP Method of administration: Elastomeric (Continuous infusion) Method of administration may be changed at the discretion of home infusion pharmacist based upon assessment of the patient and/or caregiver's ability to self-administer the medication ordered., Disp: 39 Units, Rfl: 0   polyethylene glycol (MIRALAX / GLYCOLAX) 17 g packet, Take 17 g by mouth daily. (Patient not taking: Reported on 09/07/2023), Disp: 14 each, Rfl: 2   pravastatin (PRAVACHOL) 40 MG  tablet, Take 1 tablet (40 mg total) by mouth daily at 6 PM., Disp: 30 tablet, Rfl: 0   RESTASIS 0.05 % ophthalmic emulsion, Place 1 drop into both eyes daily., Disp: 0.4 mL, Rfl: 0   sertraline (ZOLOFT) 50 MG tablet, Take 50 mg by mouth daily as needed (depression, sleep)., Disp: , Rfl:    silver sulfADIAZINE (SILVADENE) 1 % cream, Apply 1 Application topically 2 (two) times daily., Disp: , Rfl:    tetrahydrozoline 0.05 % ophthalmic solution, Place 1 drop into both eyes 4 (four) times daily., Disp: , Rfl:    triamcinolone (NASACORT) 55 MCG/ACT AERO nasal inhaler, Place 1-2 sprays into the nose 2 (two) times daily as needed (for seasonal allergies)., Disp: 1 each, Rfl: 3   Turmeric 500 MG CAPS, Take 1 capsule by mouth at bedtime., Disp: , Rfl:    Vitamin D-Vitamin K (K2 PLUS D3) 3022893343 MCG-UNIT TABS, Take 1 tablet by mouth daily., Disp: , Rfl:     Review of Systems  Constitutional:  Negative for activity change, appetite change, chills, diaphoresis, fatigue, fever and unexpected weight change.  HENT:  Negative for congestion, rhinorrhea, sinus pressure, sneezing, sore throat and trouble swallowing.   Eyes:  Negative for photophobia and visual disturbance.  Respiratory:  Negative for cough, chest tightness, shortness of breath, wheezing and stridor.   Cardiovascular:  Negative for chest  pain, palpitations and leg swelling.  Gastrointestinal:  Negative for abdominal distention, abdominal pain, anal bleeding, blood in stool, constipation, diarrhea, nausea and vomiting.  Genitourinary:  Negative for difficulty urinating, dysuria, flank pain and hematuria.  Musculoskeletal:  Negative for arthralgias, back pain, gait problem, joint swelling and myalgias.  Skin:  Negative for color change, pallor, rash and wound.  Neurological:  Negative for dizziness, tremors, weakness and light-headedness.  Hematological:  Negative for adenopathy. Does not bruise/bleed easily.  Psychiatric/Behavioral:  Negative  for agitation, behavioral problems, confusion, decreased concentration, dysphoric mood and sleep disturbance.        Objective:   Physical Exam Constitutional:      General: She is not in acute distress.    Appearance: Normal appearance. She is well-developed. She is not ill-appearing or diaphoretic.  HENT:     Head: Normocephalic and atraumatic.     Right Ear: Hearing and external ear normal.     Left Ear: Hearing and external ear normal.     Nose: No nasal deformity or rhinorrhea.  Eyes:     General: No scleral icterus.    Conjunctiva/sclera: Conjunctivae normal.     Right eye: Right conjunctiva is not injected.     Left eye: Left conjunctiva is not injected.     Pupils: Pupils are equal, round, and reactive to light.  Neck:     Vascular: No JVD.  Cardiovascular:     Rate and Rhythm: Normal rate and regular rhythm.     Heart sounds: S1 normal and S2 normal.  Abdominal:     General: Bowel sounds are normal. There is no distension.     Palpations: Abdomen is soft.     Tenderness: There is no abdominal tenderness.  Musculoskeletal:     Right shoulder: Normal.     Left shoulder: Normal.     Cervical back: Normal range of motion and neck supple.     Right hip: Normal.     Left hip: Normal.     Right knee: Normal.     Left knee: Normal.  Lymphadenopathy:     Head:     Right side of head: No submandibular, preauricular or posterior auricular adenopathy.     Left side of head: No submandibular, preauricular or posterior auricular adenopathy.     Cervical: No cervical adenopathy.     Right cervical: No superficial or deep cervical adenopathy.    Left cervical: No superficial or deep cervical adenopathy.  Skin:    General: Skin is warm and dry.     Coloration: Skin is not pale.     Findings: No abrasion, bruising, ecchymosis, erythema, lesion or rash.     Nails: There is no clubbing.  Neurological:     Mental Status: She is alert and oriented to person, place, and time.   Psychiatric:        Attention and Perception: She is attentive.        Mood and Affect: Mood normal.        Speech: Speech normal.        Behavior: Behavior normal. Behavior is cooperative.        Thought Content: Thought content normal.        Judgment: Judgment normal.    Right leg wrapped   PICC      Assessment & Plan:   Tibial and calcaneal osteomyelitis:  She has completed daptomycin and Zosyn.  PICC line was pulled today  Augmentin 875 125 twice daily with premedication  with Zofran she was not allergic to the Zosyn so should not be allergic to Augmentin that she might have GI upset and nausea so hopefully the Zofran can attenuate this this would give [place] coverage of Enterococcus and Bacteroides fragilis) beta-lactamase producing that was isolated on culture March 2023.  Rheumatoid arthritis: This could certainly confound inflammatory markers though her most recent inflammatory markers with home health were normal  I have personally spent 42 minutes involved in face-to-face and non-face-to-face activities for this patient on the day of the visit. Professional time spent includes the following activities: Preparing to see the patient (review of tests), Obtaining and/or reviewing separately obtained history (admission/discharge record), Performing a medically appropriate examination and/or evaluation , Ordering medications/tests/procedures, referring and communicating with other health care professionals, Documenting clinical information in the EMR, Independently interpreting results (not separately reported), Communicating results to the patient/family/caregiver, Counseling and educating the patient/family/caregiver and Care coordination (not separately reported).

## 2023-10-24 NOTE — Progress Notes (Signed)
Office Visit Note   Patient: Isabel Barnes           Date of Birth: 06-01-1941           MRN: [id number] Visit Date: 10/24/2023              Requested by: Jamal Collin, PA-C [address] [place],  [place] [address] PCP: Jamal Collin, PA-C  Chief Complaint  Patient presents with   Right Leg - Wound Check      HPI: Patient is a 82 year old woman is seen in follow-up for chronic ulcer medial right calf.  Patient is currently on IV antibiotics.  Reviewing the MRI scan shows cortical irregularity consistent with osteomyelitis.  Assessment & Plan: Visit Diagnoses:  1. Lymphedema   2. 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  Patient has a follow-up with infectious disease.  Discussed that I would do not feel she can resolve the underlying bone infection and with antibiotics we can improve the wound but will not resolve the underlying problem.  Follow-Up Instructions: Return in about 4 weeks (around 11/21/2023).   Ortho Exam  Patient is alert, oriented, no adenopathy, well-dressed, normal affect, normal respiratory effort. Examination the wound is smaller there is superficial epithelialization around the wound edges.  There are no ulcers on the left foot or left leg.  The ulcer on the right heel and right first metatarsal head has healed.  A 10 blade knife was used to pare the callus there is no deep open wounds.  The right leg ulcer measures 4 x 9 cm this is smaller there is fibrinous exudative tissue no drainage.  Imaging: No results found.   Labs: Lab Results  Component Value Date   ESRSEDRATE 40 (H) 02/07/2022   CRP 20.5 (H) 02/07/2022   REPTSTATUS 09/12/2023 FINAL 09/07/2023   GRAMSTAIN  02/10/2022    RARE WBC PRESENT, PREDOMINANTLY PMN NO ORGANISMS SEEN    CULT  09/07/2023    NO GROWTH 5 DAYS Performed at [facility], [address] [place], [place] [address]    [facility] ENTEROCOCCUS FAECALIS 02/10/2022     Lab Results  Component Value Date   ALBUMIN 3.5 09/07/2023   ALBUMIN 1.5 (L) 02/09/2022   ALBUMIN 2.1 (L) 02/06/2022    Lab Results  Component Value Date   MG 2.0 02/12/2022   MG 1.8 02/07/2022   Lab Results  Component Value Date   VD25OH 69.88 02/07/2022    No results found for: "PREALBUMIN"    Latest Ref Rng & Units 09/15/2023    3:09 AM 09/14/2023    8:18 AM 09/13/2023    4:59 AM  CBC EXTENDED  WBC 4.0 - 10.5 K/uL 6.4  6.5  8.6   RBC 3.87 - 5.11 MIL/uL 3.04  3.12  3.36   Hemoglobin 12.0 - 15.0 g/dL 8.6  8.8  9.7   HCT 56.2 - 46.0 % 26.9  27.2  29.0   Platelets 150 - 400 K/uL 277  284  264      There is no height or weight on file to calculate BMI.  Orders:  No orders of the defined types were placed in this encounter.  No orders of the defined types were placed in this encounter.    Procedures: No procedures performed  Clinical Data: No additional findings.  ROS:  All other systems  negative, except as noted in the HPI. Review of Systems  Objective: Vital Signs: There were no vitals taken for this visit.  Specialty Comments:  No specialty comments available.  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   RLE Wound  R Heel Ulcer 09/08/2023   LLE Cellulitis 09/07/2023   Abscess of right lower leg    Severe protein-calorie malnutrition (HCC)    Peripheral arterial disease (HCC)    Fall    Atrial fibrillation (HCC)    Goals of care, counseling/discussion    Anemia    Fluid collection (edema) in the arms, legs, hands and feet    Cellulitis of right lower extremity 02/06/2022   Chest pain, rule out acute myocardial infarction 03/16/2021   Hypertension 03/16/2021   Hypothyroidism 03/16/2021   Hyperlipemia 03/16/2021   Aortic valve stenosis 03/16/2021   Chronic venous insufficiency of lower  extremity 03/16/2021   Short-term memory loss 12/13/2019   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   Primary osteoarthritis of right knee 07/16/2016   Eosinophilia 06/16/2015   Chronic pain disorder    Osteoarthritis    Incisional umbilical hernia, without obstruction or gangrene    Iron deficiency anemia 09/19/2012   Past Medical History:  Diagnosis Date   AKI (acute kidney injury) (HCC) 09/08/2023   Anemia    iron deficiency hx.has had iron infusions before    Chronic low back pain    Chronic pain disorder    Complication of anesthesia    severe claustrophobia   Constipation    r/t use of pain meds.Takes OTC meds or eats prunes   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HCC)    Unsteady gait    occasionally   Urinary urgency     Family History  Problem Relation Age of Onset   Stroke Father     Past Surgical History:  Procedure Laterality Date   ABDOMINAL AORTOGRAM W/LOWER EXTREMITY N/A 02/15/2022   Procedure: ABDOMINAL AORTOGRAM W/LOWER EXTREMITY;  Surgeon: Maeola Harman, MD;  Location: [facility] INVASIVE CV LAB;  Service: Cardiovascular;  Laterality: N/A;   ABDOMINAL AORTOGRAM W/LOWER EXTREMITY N/A 09/12/2023   Procedure: ABDOMINAL  AORTOGRAM W/LOWER EXTREMITY;  Surgeon: Victorino Sparrow, MD;  Location: [facility] INVASIVE CV LAB;  Service: Cardiovascular;  Laterality: N/A;   ABDOMINAL HYSTERECTOMY     ANTERIOR FUSION CERVICAL SPINE     x2 -C4-7   APPLICATION OF WOUND VAC Right 03/12/2022   Procedure: APPLICATION OF WOUND VAC;  Surgeon: Nadara Mustard, MD;  Location: [facility] OR;  Service: Orthopedics;  Laterality: Right;   BUNIONECTOMY Bilateral    COLONOSCOPY     CYSTOSCOPY W/ URETEROSCOPY  2012   EYE SURGERY Bilateral    cataract /lens implant   HOLMIUM LASER APPLICATION Left 02/08/2013   Procedure: HOLMIUM LASER APPLICATION;  Surgeon: Anner Crete, MD;  Location: [facility];  Service: Urology;  Laterality: Left;   I & D EXTREMITY Right 02/10/2022   Procedure: DEBRIDEMENT RIGHT LEG ABSCESS;  Surgeon: Nadara Mustard, MD;  Location: [facility] OR;  Service: Orthopedics;  Laterality: Right;   I & D EXTREMITY Right 03/12/2022   Procedure: RIGHT LEG IRRIGATION AND DEBRIDEMENT EXTREMITY;  Surgeon: Nadara Mustard, MD;  Location: [facility] OR;  Service: Orthopedics;  Laterality: Right;   INSERTION OF MESH N/A 07/15/2014   Procedure: INSERTION OF MESH;  Surgeon: Ardeth Sportsman, MD;  Location: [facility] OR;  Service: General;  Laterality: N/A;   JOINT REPLACEMENT Right 2012   shoulder   LAPAROSCOPIC CHOLECYSTECTOMY W/ CHOLANGIOGRAPHY  2012   Dr Magnus Ivan   NASAL SINUS SURGERY     OPEN REDUCTION INTERNAL FIXATION (ORIF) DISTAL RADIAL FRACTURE Left 03/20/2021   Procedure: OPEN REDUCTION INTERNAL FIXATION (ORIF) DISTAL RADIAL FRACTURE;  Surgeon: Teryl Lucy, MD;  Location: [facility] OR;  Service: Orthopedics;  Laterality: Left;   RADIOLOGY WITH ANESTHESIA N/A 05/09/2014   Procedure: ADULT SEDATION WITH ANESTHESIA/MRI CERVICAL SPINE WITHOUT CONTRAST;  Surgeon: Medication Radiologist, MD;  Location: [facility] OR;  Service: Radiology;  Laterality: N/A;  DR. HAWKS/MRI   right knee arthroscopy     d/t meniscal tear   SHOULDER ARTHROSCOPY W/ ROTATOR CUFF REPAIR  Bilateral three times each over several yrs   SKIN FULL THICKNESS GRAFT Right 03/12/2022   Procedure: SKIN GRAFT FULL THICKNESS;  Surgeon: Nadara Mustard, MD;  Location: [facility] OR;  Service: Orthopedics;  Laterality: Right;   SKIN SPLIT GRAFT Right 02/17/2022   Procedure: RIGHT LEG SKIN GRAFT;  Surgeon: Nadara Mustard, MD;  Location: [facility] OR;  Service: Orthopedics;  Laterality: Right;   THUMB ARTHROSCOPY Left    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   Procedure: RIGHT TOTAL KNEE ARTHROPLASTY;  Surgeon: Jodi Geralds, MD;  Location: [facility] OR;  Service: Orthopedics;  Laterality: Right;   UMBILICAL HERNIA REPAIR N/A 07/15/2014   Procedure: LAPAROSCOPIC UMBILICAL AND INFRAUMBILICAL HERNIA;  Surgeon: Ardeth Sportsman, MD;  Location: [facility] OR;  Service: General;  Laterality: N/A;   Social History   Occupational History   Not on file  Tobacco Use   Smoking status: Former    Current packs/day: 0.00    Average packs/day: 0.5 packs/day for 2.0 years (1.0 ttl pk-yrs)    Types: Cigarettes    Start date: 02/06/1990    Quit date: 02/07/1992    Years since quitting: 31.7   Smokeless tobacco: Never  Vaping Use   Vaping status: Never Used  Substance and Sexual Activity   Alcohol use: No   Drug use: No   Sexual activity: Not on file

## 2023-10-24 NOTE — Progress Notes (Signed)
PICC Removal    PICC length & location:  right brachial 31 cm Removed per verbal order from: Dr. Daiva Eves  Blood thinners:  none, reports she's not currently taking aspirin 81 mg Platelet count:  356 ([facility]  10/11/23)  Site assessment: Dressing clean and dry. Extremity warm and dry. No redness, drainage, or swelling present at insertion site.   Pre-removal vital signs:  BP:  138/72 HR:  77 SpO2:  97%  Insertion site positioned below level of heart. No sutures present. Insertion site cleaned with CHG, catheter removed and petroleum dressing applied. Tip intact. Pressure held until hemostasis achieved.    Length of catheter removed:  31 cm   Provided patient with after care instructions and precautions print out (via [employer]). Reviewed this information with patient.   Patient verbalized understanding and agreement, all questions answered. Patient tolerated procedure well and remained in clinic under the care of RN 30 minutes post removal.  Post-observation vital signs:  BP:  161/84 HR:  73 SpO2:  98%  Notified Jeri Modena, RN with [facility] and [facility] pharmacy team of removal.  Sandie Ano, RN

## 2023-11-01 ENCOUNTER — Ambulatory Visit: Payer: Medicare Other | Admitting: Orthopedic Surgery

## 2023-11-02 ENCOUNTER — Encounter: Payer: Self-pay | Admitting: Family

## 2023-11-02 ENCOUNTER — Ambulatory Visit: Payer: Medicare Other | Admitting: Family

## 2023-11-02 DIAGNOSIS — I87332 Chronic venous hypertension (idiopathic) with ulcer and inflammation of left lower extremity: Secondary | ICD-10-CM | POA: Diagnosis not present

## 2023-11-02 DIAGNOSIS — I890 Lymphedema, not elsewhere classified: Secondary | ICD-10-CM

## 2023-11-02 DIAGNOSIS — L97914 Non-pressure chronic ulcer of unspecified part of right lower leg with necrosis of bone: Secondary | ICD-10-CM

## 2023-11-02 NOTE — Progress Notes (Signed)
Office Visit Note   Patient: Isabel Barnes           Date of Birth: Jun 05, 1941           MRN: [id number] Visit Date: 11/02/2023              Requested by: Jamal Collin, PA-C [address] [place],  [place] [address] PCP: Jamal Collin, PA-C  Chief Complaint  Patient presents with   Right Leg - Follow-up      HPI: The patient is an 82 year old woman seen today in follow-up for chronic ulcer to the medial right calf.  She is currently on IV antibiotics.  She has had recent MRI scan showing cortical irregularity consistent with osteomyelitis  Today her daughter accompanies the visit their main concern today is that during routine dressing changes with some slough was debrided from the wound bed they had an area of increased redness distally and proximally in the wound bed this was more bright red they were concerned  Assessment & Plan: Visit Diagnoses: No diagnosis found.  Plan: Reassurance provided.  The area of increased redness was consistent with increased granulation after slough was debrided from the wound.  They will continue with current wound care and follow-up as scheduled  Follow-Up Instructions: Return in about 29 days (around 12/01/2023).   Ortho Exam  Patient is alert, oriented, no adenopathy, well-dressed, normal affect, normal respiratory effort. On exam the wound is stable there is increased granulation from last available photos there is superficial epithelialization around the wound edges.  There is no surrounding erythema no purulence no warmth  Imaging: No results found. No images are attached to the encounter.  Labs: Lab Results  Component Value Date   ESRSEDRATE 40 (H) 02/07/2022   CRP 20.5 (H) 02/07/2022   REPTSTATUS 09/12/2023 FINAL 09/07/2023   GRAMSTAIN  02/10/2022    RARE WBC PRESENT, PREDOMINANTLY PMN NO ORGANISMS SEEN    CULT  09/07/2023    NO GROWTH 5 DAYS Performed at [facility], [address],  [place], [place] [address]    [facility] ENTEROCOCCUS FAECALIS 02/10/2022     Lab Results  Component Value Date   ALBUMIN 3.5 09/07/2023   ALBUMIN 1.5 (L) 02/09/2022   ALBUMIN 2.1 (L) 02/06/2022    Lab Results  Component Value Date   MG 2.0 02/12/2022   MG 1.8 02/07/2022   Lab Results  Component Value Date   VD25OH 69.88 02/07/2022    No results found for: "PREALBUMIN"    Latest Ref Rng & Units 09/15/2023    3:09 AM 09/14/2023    8:18 AM 09/13/2023    4:59 AM  CBC EXTENDED  WBC 4.0 - 10.5 K/uL 6.4  6.5  8.6   RBC 3.87 - 5.11 MIL/uL 3.04  3.12  3.36   Hemoglobin 12.0 - 15.0 g/dL 8.6  8.8  9.7   HCT 84.1 - 46.0 % 26.9  27.2  29.0   Platelets 150 - 400 K/uL 277  284  264      There is no height or weight on file to calculate BMI.  Orders:  No orders of the defined types were placed in this encounter.  No orders of the defined types were placed in this encounter.    Procedures: No procedures performed  Clinical Data: No additional findings.  ROS:  All other systems negative, except as noted in the HPI. Review of Systems  Objective: Vital Signs: There were no vitals  taken for this visit.  Specialty Comments:  No specialty comments available.  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   RLE Wound  R Heel Ulcer 09/08/2023   LLE Cellulitis 09/07/2023   Abscess of right lower leg    Severe protein-calorie malnutrition (HCC)    Peripheral arterial disease (HCC)    Fall    Atrial fibrillation (HCC)    Goals of care, counseling/discussion    Anemia    Fluid collection (edema) in the arms, legs, hands and feet    Cellulitis of right lower extremity 02/06/2022   Chest pain, rule out acute myocardial infarction 03/16/2021   Hypertension 03/16/2021   Hypothyroidism 03/16/2021   Hyperlipemia 03/16/2021   Aortic valve stenosis 03/16/2021   Chronic venous insufficiency of lower extremity  03/16/2021   Short-term memory loss 12/13/2019   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   Primary osteoarthritis of right knee 07/16/2016   Eosinophilia 06/16/2015   Chronic pain disorder    Osteoarthritis    Incisional umbilical hernia, without obstruction or gangrene    Iron deficiency anemia 09/19/2012   Past Medical History:  Diagnosis Date   AKI (acute kidney injury) (HCC) 09/08/2023   Anemia    iron deficiency hx.has had iron infusions before    Chronic low back pain    Chronic pain disorder    Complication of anesthesia    severe claustrophobia   Constipation    r/t use of pain meds.Takes OTC meds or eats prunes   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HCC)    Unsteady gait    occasionally   Urinary urgency     Family History  Problem Relation Age of Onset   Stroke Father     Past Surgical History:  Procedure Laterality Date   ABDOMINAL AORTOGRAM W/LOWER EXTREMITY N/A 02/15/2022   Procedure: ABDOMINAL AORTOGRAM W/LOWER EXTREMITY;  Surgeon: Maeola Harman, MD;  Location: [facility] INVASIVE CV LAB;  Service: Cardiovascular;  Laterality: N/A;   ABDOMINAL AORTOGRAM W/LOWER EXTREMITY N/A 09/12/2023   Procedure: ABDOMINAL AORTOGRAM W/LOWER  EXTREMITY;  Surgeon: Victorino Sparrow, MD;  Location: [facility] INVASIVE CV LAB;  Service: Cardiovascular;  Laterality: N/A;   ABDOMINAL HYSTERECTOMY     ANTERIOR FUSION CERVICAL SPINE     x2 -C4-7   APPLICATION OF WOUND VAC Right 03/12/2022   Procedure: APPLICATION OF WOUND VAC;  Surgeon: Nadara Mustard, MD;  Location: [facility] OR;  Service: Orthopedics;  Laterality: Right;   BUNIONECTOMY  Bilateral    COLONOSCOPY     CYSTOSCOPY W/ URETEROSCOPY  2012   EYE SURGERY Bilateral    cataract /lens implant   HOLMIUM LASER APPLICATION Left 02/08/2013   Procedure: HOLMIUM LASER APPLICATION;  Surgeon: Anner Crete, MD;  Location: [facility];  Service: Urology;  Laterality: Left;   I & D EXTREMITY Right 02/10/2022   Procedure: DEBRIDEMENT RIGHT LEG ABSCESS;  Surgeon: Nadara Mustard, MD;  Location: [facility] OR;  Service: Orthopedics;  Laterality: Right;   I & D EXTREMITY Right 03/12/2022   Procedure: RIGHT LEG IRRIGATION AND DEBRIDEMENT EXTREMITY;  Surgeon: Nadara Mustard, MD;  Location: [facility] OR;  Service: Orthopedics;  Laterality: Right;   INSERTION OF MESH N/A 07/15/2014   Procedure: INSERTION OF MESH;  Surgeon: Ardeth Sportsman, MD;  Location: [facility] OR;  Service: General;  Laterality: N/A;   JOINT REPLACEMENT Right 2012   shoulder   LAPAROSCOPIC CHOLECYSTECTOMY W/ CHOLANGIOGRAPHY  2012   Dr Magnus Ivan   NASAL SINUS SURGERY     OPEN REDUCTION INTERNAL FIXATION (ORIF) DISTAL RADIAL FRACTURE Left 03/20/2021   Procedure: OPEN REDUCTION INTERNAL FIXATION (ORIF) DISTAL RADIAL FRACTURE;  Surgeon: Teryl Lucy, MD;  Location: [facility] OR;  Service: Orthopedics;  Laterality: Left;   RADIOLOGY WITH ANESTHESIA N/A 05/09/2014   Procedure: ADULT SEDATION WITH ANESTHESIA/MRI CERVICAL SPINE WITHOUT CONTRAST;  Surgeon: Medication Radiologist, MD;  Location: [facility] OR;  Service: Radiology;  Laterality: N/A;  DR. HAWKS/MRI   right knee arthroscopy     d/t meniscal tear   SHOULDER ARTHROSCOPY W/ ROTATOR CUFF REPAIR Bilateral three times  each over several yrs   SKIN FULL THICKNESS GRAFT Right 03/12/2022   Procedure: SKIN GRAFT FULL THICKNESS;  Surgeon: Nadara Mustard, MD;  Location: [facility] OR;  Service: Orthopedics;  Laterality: Right;   SKIN SPLIT GRAFT Right 02/17/2022   Procedure: RIGHT LEG SKIN GRAFT;  Surgeon: Nadara Mustard, MD;  Location: [facility] OR;  Service: Orthopedics;  Laterality: Right;   THUMB ARTHROSCOPY Left    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   Procedure: RIGHT TOTAL KNEE ARTHROPLASTY;  Surgeon: Jodi Geralds, MD;  Location: [facility] OR;  Service: Orthopedics;  Laterality: Right;   UMBILICAL HERNIA REPAIR N/A 07/15/2014   Procedure: LAPAROSCOPIC UMBILICAL AND INFRAUMBILICAL HERNIA;  Surgeon: Ardeth Sportsman, MD;  Location: [facility] OR;  Service: General;  Laterality: N/A;   Social History   Occupational History   Not on file  Tobacco Use   Smoking status: Former    Current packs/day: 0.00    Average packs/day: 0.5 packs/day for 2.0 years (1.0 ttl pk-yrs)    Types: Cigarettes    Start date: 02/06/1990    Quit date: 02/07/1992    Years since quitting: 31.7   Smokeless tobacco: Never  Vaping Use   Vaping status: Never Used  Substance and Sexual Activity   Alcohol use: No   Drug use: No   Sexual activity: Not on file

## 2023-11-04 ENCOUNTER — Other Ambulatory Visit (HOSPITAL_COMMUNITY): Payer: Self-pay

## 2023-11-28 ENCOUNTER — Encounter: Payer: Self-pay | Admitting: Orthopedic Surgery

## 2023-11-28 ENCOUNTER — Ambulatory Visit: Payer: Medicare Other | Admitting: Orthopedic Surgery

## 2023-11-28 DIAGNOSIS — I890 Lymphedema, not elsewhere classified: Secondary | ICD-10-CM | POA: Diagnosis not present

## 2023-11-28 DIAGNOSIS — L97914 Non-pressure chronic ulcer of unspecified part of right lower leg with necrosis of bone: Secondary | ICD-10-CM | POA: Diagnosis not present

## 2023-11-28 DIAGNOSIS — I87332 Chronic venous hypertension (idiopathic) with ulcer and inflammation of left lower extremity: Secondary | ICD-10-CM

## 2023-11-28 NOTE — Progress Notes (Signed)
Office Visit Note   Patient: Isabel Barnes           Date of Birth: March 18, 1941           MRN: [id number] Visit Date: 11/28/2023              Requested by: Jamal Collin, PA-C 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  Patient states she is currently has dressing changes Monday Wednesday Friday and Saturday.  She is using her lymphedema pumps.  Assessment & Plan: Visit Diagnoses:  1. 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 Lymphedema   3. 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  Continue with the lymphedema pumps.  Follow-Up Instructions: Return in about 4 weeks (around 12/26/2023).   Ortho Exam  Patient is alert, oriented, no adenopathy, well-dressed, normal affect, normal respiratory effort. Examination patient has pitting edema with dermatitis.  There is no tenderness to palpation the redness resolves with elevation.  The ulcer measures 3 x 10 cm with healthy granulation tissue.  The ulcer on the heel has epithelialized.    Imaging: No results found.   Labs: Lab Results  Component Value Date   ESRSEDRATE 40 (H) 02/07/2022   CRP 20.5 (H) 02/07/2022   REPTSTATUS 09/12/2023 FINAL 09/07/2023   GRAMSTAIN  02/10/2022    RARE WBC PRESENT, PREDOMINANTLY PMN NO ORGANISMS SEEN    CULT  09/07/2023    NO GROWTH 5 DAYS Performed at [facility], [address] [place], [place] [address]    [facility] ENTEROCOCCUS FAECALIS 02/10/2022     Lab Results  Component Value Date   ALBUMIN 3.5 09/07/2023   ALBUMIN 1.5 (L) 02/09/2022   ALBUMIN 2.1 (L) 02/06/2022    Lab Results  Component  Value Date   MG 2.0 02/12/2022   MG 1.8 02/07/2022   Lab Results  Component Value Date   VD25OH 69.88 02/07/2022    No results found for: "PREALBUMIN"    Latest Ref Rng & Units 09/15/2023    3:09 AM 09/14/2023    8:18 AM 09/13/2023    4:59 AM  CBC EXTENDED  WBC 4.0 - 10.5 K/uL 6.4  6.5  8.6   RBC 3.87 - 5.11 MIL/uL 3.04  3.12  3.36   Hemoglobin 12.0 - 15.0 g/dL 8.6  8.8  9.7   HCT 56.2 - 46.0 % 26.9  27.2  29.0   Platelets 150 - 400 K/uL 277  284  264      There is no height or weight on file to calculate BMI.  Orders:  No orders of the defined types were placed in this encounter.  No orders of the defined types were placed in this encounter.    Procedures: No procedures performed  Clinical Data: No additional findings.  ROS:  All other systems negative, except as noted in the HPI. Review of Systems  Objective: Vital Signs: There were no vitals taken for this visit.  Specialty Comments:  No specialty comments available.  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   RLE Wound  R Heel Ulcer 09/08/2023   LLE Cellulitis 09/07/2023   Abscess of right lower leg    Severe protein-calorie malnutrition (HCC)    Peripheral arterial disease (HCC)    Fall    Atrial fibrillation (HCC)    Goals of care, counseling/discussion    Anemia    Fluid collection (edema) in the arms, legs, hands and feet    Cellulitis of right lower extremity 02/06/2022   Chest pain, rule out acute myocardial infarction 03/16/2021   Hypertension 03/16/2021   Hypothyroidism 03/16/2021   Hyperlipemia 03/16/2021   Aortic valve stenosis 03/16/2021   Chronic venous insufficiency of lower extremity 03/16/2021   Short-term memory loss 12/13/2019   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   Primary osteoarthritis of right knee 07/16/2016   Eosinophilia 06/16/2015   Chronic pain disorder    Osteoarthritis    Incisional  umbilical hernia, without obstruction or gangrene    Iron deficiency anemia 09/19/2012   Past Medical History:  Diagnosis Date   AKI (acute kidney injury) (HCC) 09/08/2023   Anemia    iron deficiency hx.has had iron infusions before    Chronic low back pain    Chronic pain disorder    Complication of anesthesia    severe claustrophobia   Constipation    r/t use of pain meds.Takes OTC meds or eats prunes   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HCC)    Unsteady gait    occasionally   Urinary urgency     Family History  Problem Relation Age of Onset   Stroke Father     Past Surgical History:  Procedure Laterality Date   ABDOMINAL AORTOGRAM W/LOWER EXTREMITY N/A 02/15/2022   Procedure: ABDOMINAL AORTOGRAM W/LOWER EXTREMITY;  Surgeon: Maeola Harman, MD;  Location[facility] INVASIVE CV LAB;  Service: Cardiovascular;  Laterality: N/A;   ABDOMINAL AORTOGRAM W/LOWER EXTREMITY N/A 09/12/2023   Procedure: ABDOMINAL AORTOGRAM W/LOWER EXTREMITY;  Surgeon: Victorino Sparrow, MD;  Location: [facility] INVASIVE CV LAB;  Service: Cardiovascular;  Laterality: N/A;   ABDOMINAL HYSTERECTOMY     ANTERIOR FUSION CERVICAL SPINE     x2 -C4-7   APPLICATION OF WOUND VAC  Right 03/12/2022   Procedure: APPLICATION OF WOUND VAC;  Surgeon: Nadara Mustard, MD;  Location: [facility] OR;  Service: Orthopedics;  Laterality: Right;   BUNIONECTOMY Bilateral    COLONOSCOPY     CYSTOSCOPY W/ URETEROSCOPY  2012   EYE SURGERY Bilateral    cataract /lens implant   HOLMIUM LASER  APPLICATION Left 02/08/2013   Procedure: HOLMIUM LASER APPLICATION;  Surgeon: Anner Crete, MD;  Location[facility];  Service: Urology;  Laterality: Left;   I & D EXTREMITY Right 02/10/2022   Procedure: DEBRIDEMENT RIGHT LEG ABSCESS;  Surgeon: Nadara Mustard, MD;  Location: [facility];  Service: Orthopedics;  Laterality: Right;   I & D EXTREMITY Right 03/12/2022   Procedure: RIGHT LEG IRRIGATION AND DEBRIDEMENT EXTREMITY;  Surgeon: Nadara Mustard, MD;  Location: [facility] OR;  Service: Orthopedics;  Laterality: Right;   INSERTION OF MESH N/A 07/15/2014   Procedure: INSERTION OF MESH;  Surgeon: Ardeth Sportsman, MD;  Location: [facility] OR;  Service: General;  Laterality: N/A;   JOINT REPLACEMENT Right 2012   shoulder   LAPAROSCOPIC CHOLECYSTECTOMY W/ CHOLANGIOGRAPHY  2012   Dr Magnus Ivan   NASAL SINUS SURGERY     OPEN REDUCTION INTERNAL FIXATION (ORIF) DISTAL RADIAL FRACTURE Left 03/20/2021   Procedure: OPEN REDUCTION INTERNAL FIXATION (ORIF) DISTAL RADIAL FRACTURE;  Surgeon: Teryl Lucy, MD;  Location: [facility] OR;  Service: Orthopedics;  Laterality: Left;   RADIOLOGY WITH ANESTHESIA N/A 05/09/2014   Procedure: ADULT SEDATION WITH ANESTHESIA/MRI CERVICAL SPINE WITHOUT CONTRAST;  Surgeon: Medication Radiologist, MD;  Location: [facility] OR;  Service: Radiology;  Laterality: N/A;  DR. HAWKS/MRI   right knee arthroscopy     d/t meniscal tear   SHOULDER ARTHROSCOPY W/ ROTATOR CUFF REPAIR Bilateral three times each over several yrs   SKIN FULL THICKNESS GRAFT Right 03/12/2022   Procedure: SKIN GRAFT FULL THICKNESS;  Surgeon: Nadara Mustard, MD;  Location: [facility] OR;  Service: Orthopedics;  Laterality: Right;   SKIN SPLIT GRAFT Right  02/17/2022   Procedure: RIGHT LEG SKIN GRAFT;  Surgeon: Nadara Mustard, MD;  Location: [facility] OR;  Service: Orthopedics;  Laterality: Right;   THUMB ARTHROSCOPY Left    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   Procedure: RIGHT TOTAL KNEE ARTHROPLASTY;  Surgeon: Jodi Geralds, MD;  Location: [facility] OR;  Service: Orthopedics;  Laterality: Right;   UMBILICAL HERNIA REPAIR N/A 07/15/2014   Procedure: LAPAROSCOPIC UMBILICAL AND INFRAUMBILICAL HERNIA;  Surgeon: Ardeth Sportsman, MD;  Location: [facility] OR;  Service: General;  Laterality: N/A;   Social History   Occupational History   Not on file  Tobacco Use   Smoking status: Former    Current packs/day: 0.00    Average packs/day: 0.5 packs/day for 2.0 years (1.0 ttl pk-yrs)    Types: Cigarettes    Start date: 02/06/1990    Quit date: 02/07/1992    Years since quitting: 31.8   Smokeless tobacco: Never  Vaping Use   Vaping status: Never Used  Substance and Sexual Activity   Alcohol use: No   Drug use: No   Sexual activity: Not on file

## 2023-12-18 ENCOUNTER — Encounter: Payer: Self-pay | Admitting: Infectious Disease

## 2023-12-18 DIAGNOSIS — M86171 Other acute osteomyelitis, right ankle and foot: Secondary | ICD-10-CM

## 2023-12-18 HISTORY — DX: Other acute osteomyelitis, right ankle and foot: M86.171

## 2023-12-18 NOTE — Progress Notes (Unsigned)
Subjective:  Chief complaint follow-up for osteomyelitis on Augmentin   [name], female    DOB: May 15, 1941, 83 y.o.   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 Lajoyce Corners eventually transitioned over to oral Augmentin which she has been able to tolerate with premedication with Zofran.  Her wound is continue to improve.  She did have some pain last week in her leg which resolved.  She does have comorbid rheumatoid arthritis and is not on any disease modifying medications.     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HCC)    Unsteady gait    occasionally    Urinary urgency     Past Surgical History:  Procedure Laterality Date   ABDOMINAL AORTOGRAM W/LOWER EXTREMITY N/A 02/15/2022   Procedure: ABDOMINAL AORTOGRAM W/LOWER EXTREMITY;  Surgeon: Maeola Harman, MD;  Location: [facility] INVASIVE CV LAB;  Service: Cardiovascular;  Laterality: N/A;   ABDOMINAL AORTOGRAM W/LOWER EXTREMITY N/A 09/12/2023   Procedure: ABDOMINAL AORTOGRAM W/LOWER EXTREMITY;  Surgeon: Victorino Sparrow, MD;  Location: [facility] INVASIVE CV LAB;  Service: Cardiovascular;  Laterality: N/A;   ABDOMINAL HYSTERECTOMY     ANTERIOR FUSION CERVICAL SPINE     x2 -C4-7   APPLICATION OF WOUND VAC Right 03/12/2022   Procedure: APPLICATION OF WOUND VAC;  Surgeon: Nadara Mustard, MD;  Location: [facility] OR;  Service: Orthopedics;  Laterality: Right;   BUNIONECTOMY Bilateral    COLONOSCOPY     CYSTOSCOPY W/ URETEROSCOPY  2012   EYE SURGERY Bilateral    cataract /lens implant   HOLMIUM LASER APPLICATION Left 02/08/2013   Procedure: HOLMIUM LASER APPLICATION;  Surgeon: Anner Crete, MD;  Location: [facility];  Service: Urology;  Laterality: Left;   I & D EXTREMITY Right 02/10/2022   Procedure: DEBRIDEMENT RIGHT LEG ABSCESS;  Surgeon: Nadara Mustard, MD;  Location: [facility] OR;  Service:  Orthopedics;  Laterality: Right;   I & D EXTREMITY Right 03/12/2022   Procedure: RIGHT LEG IRRIGATION AND DEBRIDEMENT EXTREMITY;  Surgeon: Nadara Mustard, MD;  Location: [facility] OR;  Service: Orthopedics;  Laterality: Right;   INSERTION OF MESH N/A 07/15/2014   Procedure: INSERTION OF MESH;  Surgeon: Ardeth Sportsman, MD;  Location: [facility] OR;  Service: General;  Laterality: N/A;   JOINT REPLACEMENT Right 2012   shoulder   LAPAROSCOPIC CHOLECYSTECTOMY W/ CHOLANGIOGRAPHY  2012   Dr Magnus Ivan   NASAL SINUS SURGERY     OPEN REDUCTION INTERNAL FIXATION (ORIF) DISTAL RADIAL FRACTURE Left 03/20/2021   Procedure: OPEN REDUCTION INTERNAL FIXATION (ORIF) DISTAL RADIAL FRACTURE;  Surgeon: Teryl Lucy, MD;  Location: [facility]  OR;  Service: Orthopedics;  Laterality: Left;   RADIOLOGY WITH ANESTHESIA N/A 05/09/2014   Procedure: ADULT SEDATION WITH ANESTHESIA/MRI CERVICAL SPINE WITHOUT CONTRAST;  Surgeon: Medication Radiologist, MD;  Location: [facility] OR;  Service: Radiology;  Laterality: N/A;  DR. HAWKS/MRI   right knee arthroscopy     d/t meniscal tear   SHOULDER ARTHROSCOPY W/ ROTATOR CUFF REPAIR Bilateral three times each over several yrs   SKIN FULL THICKNESS GRAFT Right 03/12/2022   Procedure: SKIN GRAFT FULL THICKNESS;  Surgeon: Nadara Mustard, MD;  Location: [facility] OR;  Service: Orthopedics;  Laterality: Right;   SKIN SPLIT GRAFT Right 02/17/2022   Procedure: RIGHT LEG SKIN GRAFT;  Surgeon: Nadara Mustard, MD;  Location: [facility] OR;  Service: Orthopedics;  Laterality: Right;   THUMB ARTHROSCOPY Left    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   Procedure: RIGHT TOTAL KNEE ARTHROPLASTY;  Surgeon: Jodi Geralds, MD;  Location: [facility] OR;  Service: Orthopedics;  Laterality: Right;   UMBILICAL HERNIA REPAIR N/A 07/15/2014   Procedure: LAPAROSCOPIC UMBILICAL AND INFRAUMBILICAL HERNIA;  Surgeon: Ardeth Sportsman, MD;  Location: [facility] OR;  Service: General;  Laterality: N/A;    Family History  Problem Relation Age of Onset   Stroke Father       Social History   Socioeconomic History   Marital status: Divorced    Spouse name: Not on file   Number of children: Not on file   Years of education: Not on file   Highest education level: Not on file  Occupational History   Not on file  Tobacco Use   Smoking status: Former    Current packs/day: 0.00    Average packs/day: 0.5 packs/day for 2.0 years (1.0 ttl pk-yrs)    Types: Cigarettes    Start date: 02/06/1990    Quit date: 02/07/1992    Years since quitting: 31.8   Smokeless tobacco: Never  Vaping Use   Vaping status: Never Used  Substance and Sexual Activity   Alcohol use: No   Drug use: No   Sexual activity: Not on file  Other Topics Concern   Not on file  Social History  Narrative   Not on file   Social Drivers of Health   Financial Resource Strain: Low Risk  (05/02/2023)   Received from [facility]   Overall Financial Resource Strain (CARDIA)    Difficulty of Paying Living Expenses: Not very hard  Food Insecurity: No Food Insecurity (05/02/2023)   Received from [facility]   Hunger Vital Sign    Worried About Running Out of Food in the Last Year: Never true    Ran Out of Food in the Last Year: Never true  Transportation Needs: No Transportation Needs (05/02/2023)   Received from [facility]  PRAPARE - Administrator, Civil Service (Medical): No    Lack of Transportation (Non-Medical): No  Physical Activity: Unknown (05/02/2023)   Received from [facility]   Exercise Vital Sign    Days of Exercise per Week: 0 days    Minutes of Exercise per Session: Not on file  Stress: No Stress Concern Present (05/02/2023)   Received from [facility] - Occupational Stress Questionnaire    Feeling of Stress : Only a little  Social Connections: Moderately Integrated (05/02/2023)   Received from [facility]   Social Network    How would you rate your social network (family, work, friends)?: Adequate participation with social networks    Allergies  Allergen Reactions   Dilaudid [Hydromorphone Hcl] Shortness Of Breath   Gabapentin Other (See Comments)    Hoarseness , headache and sore throat   Latex Rash    Severe rash   Lyrica [Pregabalin] Other (See Comments)    No balance , had to walk with cane , Blurred vision,weakness.   Oxycodone Shortness Of Breath and Other (See Comments)    Tolerated oxycodone during admission to hospital 02/06/22   Singulair [Montelukast] Shortness Of Breath and Other (See Comments)    Vision issues, also   Ciprofloxacin Hcl Nausea And Vomiting and Other (See Comments)    Nausea and vomiting with by mouth form   Codeine Other (See Comments)    Hallucinations   Methadone  Nausea And Vomiting and Other (See Comments)    Severe nausea and vomiting   Metronidazole Nausea And Vomiting and Other (See Comments)    Gastric pain   Oysters [Shellfish Allergy] Other (See Comments)    "Terrible gastric upset and cramping."   Clindamycin/Lincomycin Diarrhea and Nausea Only   Donepezil Diarrhea and Other (See Comments)    Severe diarrhea   Sulfa Antibiotics Diarrhea and Other (See Comments)    GI issues    Tape Other (See Comments)    ADHESIVE TAPE-Severe rash   Zetia [Ezetimibe] Diarrhea   Elemental Sulfur Nausea And Vomiting   Iodine Rash   Other Rash    All Antibiotic ointments/ creams   Oyster Shell Rash   Penicillin G Nausea And Vomiting and Rash    Patient tolerates IV piperacillin/tazobactam. Has some history of n/v with amoxicillin/penicillin orally   Povidone Iodine Rash and Other (See Comments)    Oyster shell products- Rash    Skintegrity Hydrogel [Skin Protectants, Misc.] Rash   Tapentadol Other (See Comments)    Nightmares **Nucynta**     Current Outpatient Medications:    acetaminophen (TYLENOL) 500 MG tablet, Take 2 tablets (1,000 mg total) by mouth 3 (three) times daily. (Patient taking differently: Take 1,000 mg by mouth daily as needed for moderate pain (pain score 4-6).), Disp: 180 tablet, Rfl: 0   amoxicillin-clavulanate (AUGMENTIN) 875-125 MG tablet, Take 1 tablet by mouth 2 (two) times daily., Disp: 60 tablet, Rfl: 5   aspirin EC 81 MG tablet, Take 1 tablet (81 mg total) by mouth daily. Swallow whole., Disp: 30 tablet, Rfl: 12   cholecalciferol (VITAMIN D) 1000 UNITS tablet, Take 1,000 Units by mouth daily., Disp: , Rfl:    diclofenac sodium (VOLTAREN) 1 % GEL, Apply 2 g topically daily as needed (for pain)., Disp: , Rfl:    Digestive Enzymes (ENZYME DIGEST) CAPS, Take 1 capsule by mouth daily as needed (For digestive)., Disp: 30 capsule, Rfl: 0   ENSURE PLUS (ENSURE PLUS)  LIQD, Take 237 mLs by mouth daily., Disp: , Rfl:    ferrous  sulfate 325 (65 FE) MG tablet, Take 1 tablet (325 mg total) by mouth daily with breakfast. (Patient not taking: Reported on 09/07/2023), Disp: 30 tablet, Rfl: 0   fish oil-omega-3 fatty acids 1000 MG capsule, Take 1,000 mg by mouth 2 (two) times daily. , Disp: , Rfl:    HYDROcodone-acetaminophen (NORCO) 10-325 MG tablet, Take 1 tablet by mouth in the morning, at noon, and at bedtime., Disp: , Rfl:    Lactobacillus (PROBIOTIC ACIDOPHILUS PO), Take 1 capsule by mouth daily., Disp: , Rfl:    levothyroxine (SYNTHROID) 100 MCG tablet, Take 1 tablet (100 mcg total) by mouth every morning., Disp: 30 tablet, Rfl: 0   loperamide (IMODIUM A-D) 2 MG tablet, Take 4 mg by mouth 3 (three) times daily as needed for diarrhea or loose stools., Disp: , Rfl:    loratadine (CLARITIN) 10 MG tablet, Take 1 tablet (10 mg total) by mouth daily., Disp: 30 tablet, Rfl: 0   losartan (COZAAR) 25 MG tablet, Take 1 tablet (25 mg total) by mouth daily. (Patient taking differently: Take 100 mg by mouth daily.), Disp: 30 tablet, Rfl: 0   Multiple Vitamins-Minerals (MULTIVITAMIN PO), Take 1 tablet by mouth daily., Disp: , Rfl:    ondansetron (ZOFRAN) 4 MG tablet, Take 1 tablet (4 mg total) by mouth 2 (two) times daily. Take before each dose of augmentin and can take again if needed up to 3 doses per day, Disp: 90 tablet, Rfl: 3   polyethylene glycol (MIRALAX / GLYCOLAX) 17 g packet, Take 17 g by mouth daily. (Patient not taking: Reported on 10/24/2023), Disp: 14 each, Rfl: 2   pravastatin (PRAVACHOL) 40 MG tablet, Take 1 tablet (40 mg total) by mouth daily at 6 PM., Disp: 30 tablet, Rfl: 0   RESTASIS 0.05 % ophthalmic emulsion, Place 1 drop into both eyes daily., Disp: 0.4 mL, Rfl: 0   sertraline (ZOLOFT) 50 MG tablet, Take 50 mg by mouth daily as needed (depression, sleep)., Disp: , Rfl:    silver sulfADIAZINE (SILVADENE) 1 % cream, Apply 1 Application topically 2 (two) times daily., Disp: , Rfl:    tetrahydrozoline 0.05 % ophthalmic  solution, Place 1 drop into both eyes 4 (four) times daily., Disp: , Rfl:    triamcinolone (NASACORT) 55 MCG/ACT AERO nasal inhaler, Place 1-2 sprays into the nose 2 (two) times daily as needed (for seasonal allergies)., Disp: 1 each, Rfl: 3   Turmeric 500 MG CAPS, Take 1 capsule by mouth at bedtime., Disp: , Rfl:    Vitamin D-Vitamin K (K2 PLUS D3) [phone] MCG-UNIT TABS, Take 1 tablet by mouth daily., Disp: , Rfl:     Review of Systems  Constitutional:  Negative for activity change, appetite change, chills, diaphoresis, fatigue, fever and unexpected weight change.  HENT:  Negative for congestion, rhinorrhea, sinus pressure, sneezing, sore throat and trouble swallowing.   Eyes:  Negative for photophobia and visual disturbance.  Respiratory:  Negative for cough, chest tightness, shortness of breath, wheezing and stridor.   Cardiovascular:  Negative for chest pain, palpitations and leg swelling.  Gastrointestinal:  Negative for abdominal distention, abdominal pain, anal bleeding, blood in stool, constipation, diarrhea, nausea and vomiting.  Genitourinary:  Negative for difficulty urinating, dysuria, flank pain and hematuria.  Musculoskeletal:  Negative for arthralgias, back pain, gait problem, joint swelling and myalgias.  Skin:  Positive for wound. Negative for color change, pallor and rash.  Neurological:  Negative for dizziness, tremors, weakness and light-headedness.  Hematological:  Negative for adenopathy. Does not bruise/bleed easily.  Psychiatric/Behavioral:  Negative for agitation, behavioral problems, confusion, decreased concentration, dysphoric mood and sleep disturbance.        Objective:   Physical Exam Constitutional:      General: She is not in acute distress.    Appearance: Normal appearance. She is well-developed. She is not ill-appearing or diaphoretic.  HENT:     Head: Normocephalic and atraumatic.     Right Ear: Hearing and external ear normal.     Left Ear: Hearing  and external ear normal.     Nose: No nasal deformity or rhinorrhea.  Eyes:     General: No scleral icterus.    Conjunctiva/sclera: Conjunctivae normal.     Right eye: Right conjunctiva is not injected.     Left eye: Left conjunctiva is not injected.     Pupils: Pupils are equal, round, and reactive to light.  Neck:     Vascular: No JVD.  Cardiovascular:     Rate and Rhythm: Normal rate and regular rhythm.     Heart sounds: Normal heart sounds, S1 normal and S2 normal. No murmur heard.    No friction rub.  Abdominal:     General: Bowel sounds are normal. There is no distension.     Palpations: Abdomen is soft.     Tenderness: There is no abdominal tenderness.  Musculoskeletal:        General: Normal range of motion.     Right shoulder: Normal.     Left shoulder: Normal.     Cervical back: Normal range of motion and neck supple.     Right hip: Normal.     Left hip: Normal.     Right knee: Normal.     Left knee: Normal.  Lymphadenopathy:     Head:     Right side of head: No submandibular, preauricular or posterior auricular adenopathy.     Left side of head: No submandibular, preauricular or posterior auricular adenopathy.     Cervical: No cervical adenopathy.     Right cervical: No superficial or deep cervical adenopathy.    Left cervical: No superficial or deep cervical adenopathy.  Skin:    General: Skin is warm and dry.     Coloration: Skin is not pale.     Findings: No abrasion, bruising, ecchymosis, erythema, lesion or rash.     Nails: There is no clubbing.  Neurological:     Mental Status: She is alert and oriented to person, place, and time.     Sensory: No sensory deficit.     Coordination: Coordination normal.     Gait: Gait normal.  Psychiatric:        Attention and Perception: She is attentive.        Speech: Speech normal.        Behavior: Behavior normal. Behavior is cooperative.        Thought Content: Thought content normal.        Judgment: Judgment  normal.     Wound 10/23/2024       12/19/23:        Assessment & Plan:   Tibial and calcaneal osteomyelitis:  I will recheck a sed rate CRP BMP with GFR and CBC with differential  For now we will continue with empiric Augmentin.  Will need to keep in mind the fact that her rheumatoid arthritis may confound interpretation of her inflammatory markers.  Rheumatoid arthritis would continue to not implement potent Biologics given risk of her osteomyelitis progressing rapidly if she is exposed to such medications.  She is currently on symptomatic management alone  Nausea and loose stools with Augmentin this is improved with premedication with Zofran and with her also taking food  Vaccine counseling recommended that she be up-to-date on COVID and influenza vaccination in which she claims she is.

## 2023-12-19 ENCOUNTER — Ambulatory Visit: Payer: Self-pay | Admitting: Infectious Disease

## 2023-12-19 ENCOUNTER — Ambulatory Visit: Payer: Medicare Other | Admitting: Infectious Disease

## 2023-12-19 ENCOUNTER — Other Ambulatory Visit: Payer: Self-pay

## 2023-12-19 ENCOUNTER — Encounter: Payer: Self-pay | Admitting: Infectious Disease

## 2023-12-19 VITALS — BP 139/68 | HR 76 | Resp 16 | Ht 60.0 in | Wt 122.0 lb

## 2023-12-19 DIAGNOSIS — M86361 Chronic multifocal osteomyelitis, right tibia and fibula: Secondary | ICD-10-CM

## 2023-12-19 DIAGNOSIS — I739 Peripheral vascular disease, unspecified: Secondary | ICD-10-CM | POA: Diagnosis not present

## 2023-12-19 DIAGNOSIS — L4050 Arthropathic psoriasis, unspecified: Secondary | ICD-10-CM

## 2023-12-19 DIAGNOSIS — I872 Venous insufficiency (chronic) (peripheral): Secondary | ICD-10-CM

## 2023-12-19 DIAGNOSIS — M069 Rheumatoid arthritis, unspecified: Secondary | ICD-10-CM

## 2023-12-19 DIAGNOSIS — M86171 Other acute osteomyelitis, right ankle and foot: Secondary | ICD-10-CM | POA: Diagnosis not present

## 2023-12-19 HISTORY — DX: Rheumatoid arthritis, unspecified: M06.9

## 2023-12-19 MED ORDER — ONDANSETRON HCL 4 MG PO TABS: 4.0000 mg | ORAL_TABLET | Freq: Two times a day (BID) | ORAL | 3 refills | Status: DC

## 2023-12-19 MED ORDER — AMOXICILLIN-POT CLAVULANATE 875-125 MG PO TABS: 1.0000 | ORAL_TABLET | Freq: Two times a day (BID) | ORAL | 5 refills | Status: DC

## 2023-12-20 ENCOUNTER — Telehealth: Payer: Self-pay

## 2023-12-20 LAB — CBC WITH DIFFERENTIAL/PLATELET
Absolute Lymphocytes: 1218 {cells}/uL (ref 850–3900)
Absolute Monocytes: 800 {cells}/uL (ref 200–950)
Basophils Absolute: 93 {cells}/uL (ref 0–200)
Basophils Relative: 1.6 %
Eosinophils Absolute: 1067 {cells}/uL — ABNORMAL HIGH (ref 15–500)
Eosinophils Relative: 18.4 %
HCT: 35.2 % (ref 35.0–45.0)
Hemoglobin: 11 g/dL — ABNORMAL LOW (ref 11.7–15.5)
MCH: 27.3 pg (ref 27.0–33.0)
MCHC: 31.3 g/dL — ABNORMAL LOW (ref 32.0–36.0)
MCV: 87.3 fL (ref 80.0–100.0)
MPV: 10.3 fL (ref 7.5–12.5)
Monocytes Relative: 13.8 %
Neutro Abs: 2622 {cells}/uL (ref 1500–7800)
Neutrophils Relative %: 45.2 %
Platelets: 279 10*3/uL (ref 140–400)
RBC: 4.03 10*6/uL (ref 3.80–5.10)
RDW: 13.5 % (ref 11.0–15.0)
Total Lymphocyte: 21 %
WBC: 5.8 10*3/uL (ref 3.8–10.8)

## 2023-12-20 LAB — BASIC METABOLIC PANEL WITH GFR
BUN: 22 mg/dL (ref 7–25)
CO2: 31 mmol/L (ref 20–32)
Calcium: 9.4 mg/dL (ref 8.6–10.4)
Chloride: 105 mmol/L (ref 98–110)
Creat: 0.85 mg/dL (ref 0.60–0.95)
Glucose, Bld: 86 mg/dL (ref 65–99)
Potassium: 4.9 mmol/L (ref 3.5–5.3)
Sodium: 141 mmol/L (ref 135–146)
eGFR: 68 mL/min/{1.73_m2} (ref >=60)

## 2023-12-20 LAB — C-REACTIVE PROTEIN: CRP: 7.6 mg/L (ref <8.0)

## 2023-12-20 LAB — SEDIMENTATION RATE: Sed Rate: 6 mm/h (ref 0–30)

## 2023-12-20 NOTE — Telephone Encounter (Signed)
-----   Message from [place] sent at 12/20/2023  8:58 AM EST ----- Regarding: eosinohils up would ask her to check w pcp for workup or at least repeat CBC w diff  ----- Message ----- From: Janace Hoard Lab Results In Sent: 12/19/2023  11:06 PM EST To: Randall Hiss, MD

## 2023-12-20 NOTE — Telephone Encounter (Signed)
Attempted to call patient. Left voicemail requesting back with questions.  Will send mychart message. Juanita Laster, RMA

## 2023-12-23 ENCOUNTER — Encounter: Payer: Self-pay | Admitting: Infectious Disease

## 2023-12-26 ENCOUNTER — Encounter: Payer: Self-pay | Admitting: Orthopedic Surgery

## 2023-12-26 ENCOUNTER — Ambulatory Visit: Payer: Medicare Other | Admitting: Orthopedic Surgery

## 2023-12-26 DIAGNOSIS — L97914 Non-pressure chronic ulcer of unspecified part of right lower leg with necrosis of bone: Secondary | ICD-10-CM

## 2023-12-26 DIAGNOSIS — I890 Lymphedema, not elsewhere classified: Secondary | ICD-10-CM | POA: Diagnosis not present

## 2023-12-26 NOTE — Progress Notes (Signed)
Office Visit Note   Patient: Isabel Barnes           Date of Birth: 09/14/1941           MRN: [id number] Visit Date: 12/26/2023              Requested by: Jamal Collin, PA-C [address] [place],  [place] [address] PCP: Jamal Collin, [place]  Chief Complaint  Patient presents with   Right Leg - Wound Check      HPI: Patient is an 83 year old woman with venous insufficiency ulceration right lower extremity.  Patient presents with increased lymphedema swelling in both legs.  Patient states she is unable to put on the lymphedema pumps on her own and has to wait for when her daughter is available to use them.  Assessment & Plan: Visit Diagnoses:  1. 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 Lymphedema     Plan: Discussed the importance of elevation with her foot above the heart utilizing compression and the lymphedema pumps.  Recommended against using ointment on the fungal dermatitis on the left foot and recommended either using antifungal spray or powder.  She may use the Silvadene on the right leg ulcer.  Follow-Up Instructions: Return in about 4 weeks (around 01/23/2024).   Ortho Exam  Patient is alert, oriented, no adenopathy, well-dressed, normal affect, normal respiratory effort. Examination patient has increased papillomata's changes with increased brawny skin color changes and swelling of both lower extremities.  She has a hyperkeratotic lesion on the right heel and a Wagner grade 1 ulcer beneath the first metatarsal head on the right.  After informed consent the hyperkeratotic lesion was pared and the Wagner grade 1 ulcer was debrided of skin and soft tissue with a 10 blade knife.  The first metatarsal head ulcer after debridement is 1 cm in diameter.  No signs of plantar wart for the hyperkeratotic lesion.  There is increased fibrinous exudative tissue over the venous lymphatic insufficiency ulcer right  leg.  Imaging: No results found.   Labs: Lab Results  Component Value Date   ESRSEDRATE 6 12/19/2023   ESRSEDRATE 40 (H) 02/07/2022   CRP 7.6 12/19/2023   CRP 20.5 (H) 02/07/2022   REPTSTATUS 09/12/2023 FINAL 09/07/2023   GRAMSTAIN  02/10/2022    RARE WBC PRESENT, PREDOMINANTLY PMN NO ORGANISMS SEEN    CULT  09/07/2023    NO GROWTH 5 DAYS Performed at [facility], [address], [place], [place] [address]    [facility] ENTEROCOCCUS FAECALIS 02/10/2022     Lab Results  Component Value Date   ALBUMIN 3.5 09/07/2023   ALBUMIN 1.5 (L) 02/09/2022   ALBUMIN 2.1 (L) 02/06/2022    Lab Results  Component Value Date   MG 2.0 02/12/2022   MG 1.8 02/07/2022   Lab Results  Component Value Date   VD25OH 69.88 02/07/2022    No results found for: "PREALBUMIN"    Latest Ref Rng & Units 12/19/2023   11:38 AM 09/15/2023    3:09 AM 09/14/2023    8:18 AM  CBC EXTENDED  WBC 3.8 - 10.8 Thousand/uL 5.8  6.4  6.5   RBC 3.80 - 5.10 Million/uL 4.03  3.04  3.12   Hemoglobin 11.7 - 15.5 g/dL 56.2  8.6  8.8   HCT 13.0 - 45.0 % 35.2  26.9  27.2   Platelets 140 - 400 Thousand/uL 279  277  284  NEUT# 1,500 - 7,800 cells/uL 2,622        There is no height or weight on file to calculate BMI.  Orders:  No orders of the defined types were placed in this encounter.  No orders of the defined types were placed in this encounter.    Procedures: No procedures performed  Clinical Data: No additional findings.  ROS:  All other systems negative, except as noted in the HPI. Review of Systems  Objective: Vital Signs: There were no vitals taken for this visit.  Specialty Comments:  No specialty comments available.  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   RLE Wound  R Heel Ulcer 09/08/2023   LLE  Cellulitis 09/07/2023   Abscess of right lower leg    Severe protein-calorie malnutrition (HCC)    Peripheral arterial disease (HCC)    Fall    Atrial fibrillation (HCC)    Goals of care, counseling/discussion    Anemia    Fluid collection (edema) in the arms, legs, hands and feet    Cellulitis of right lower extremity 02/06/2022   Chest pain, rule out acute myocardial infarction 03/16/2021   Hypertension 03/16/2021   Hypothyroidism 03/16/2021   Hyperlipemia 03/16/2021   Aortic valve stenosis 03/16/2021   Chronic venous insufficiency of lower extremity 03/16/2021   Short-term memory loss 12/13/2019   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   Primary osteoarthritis of right knee 07/16/2016   Eosinophilia 06/16/2015   Chronic pain disorder    Osteoarthritis    Incisional umbilical hernia, without obstruction or gangrene    Iron deficiency anemia 09/19/2012   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   Depression    GERD (gastroesophageal reflux disease)    takes Omeprazole daily   Heart murmur    mild MS, moderate-severe AS 03/17/21 echo   History of bronchitis    20+ yrs ago   History of kidney stones    3 surgerical removed, 1 passed   History of prolapse of bladder    History of shingles    Hypertension    takes Losartan daily   Hypothyroidism    takes Synthroid daily   Joint swelling    Neck pain    bone spurs at base of head per pt   Osteoarthritis    lumbar,cervical,joints   Pneumonia    hx of > 20 yrs ago   Rheumatoid arthritis  (HCC) 12/19/2023   Shortness of breath    occasionally and with exertion. 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HCC)    Unsteady gait    occasionally   Urinary urgency     Family History  Problem Relation Age of Onset   Stroke Father     Past Surgical History:  Procedure Laterality Date  ABDOMINAL AORTOGRAM W/LOWER EXTREMITY N/A 02/15/2022   Procedure: ABDOMINAL AORTOGRAM W/LOWER EXTREMITY;  Surgeon: Maeola Harman, MD;  Location: [facility] INVASIVE CV LAB;  Service: Cardiovascular;  Laterality: N/A;   ABDOMINAL AORTOGRAM W/LOWER EXTREMITY N/A 09/12/2023   Procedure: ABDOMINAL AORTOGRAM W/LOWER EXTREMITY;  Surgeon: Victorino Sparrow, MD;  Location: [facility] INVASIVE CV LAB;  Service: Cardiovascular;  Laterality: N/A;   ABDOMINAL HYSTERECTOMY     ANTERIOR FUSION CERVICAL SPINE     x2 -C4-7   APPLICATION OF WOUND VAC Right 03/12/2022   Procedure: APPLICATION OF WOUND VAC;  Surgeon: Nadara Mustard, MD;  Location: [facility] OR;  Service: Orthopedics;  Laterality: Right;   BUNIONECTOMY Bilateral    COLONOSCOPY     CYSTOSCOPY W/ URETEROSCOPY  2012   EYE SURGERY Bilateral    cataract /lens implant   HOLMIUM LASER APPLICATION Left 02/08/2013   Procedure: HOLMIUM LASER APPLICATION;  Surgeon: Anner Crete, MD;  Location: [facility];  Service: Urology;  Laterality: Left;   I & D EXTREMITY Right 02/10/2022   Procedure: DEBRIDEMENT RIGHT LEG ABSCESS;  Surgeon: Nadara Mustard, MD;  Location: [facility] OR;  Service: Orthopedics;  Laterality: Right;   I & D EXTREMITY Right 03/12/2022   Procedure: RIGHT LEG IRRIGATION AND DEBRIDEMENT EXTREMITY;  Surgeon: Nadara Mustard, MD;  Location: [facility] OR;  Service: Orthopedics;  Laterality: Right;   INSERTION OF MESH N/A 07/15/2014   Procedure: INSERTION OF MESH;  Surgeon: Ardeth Sportsman, MD;  Location: [facility] OR;  Service: General;  Laterality: N/A;   JOINT REPLACEMENT Right 2012   shoulder   LAPAROSCOPIC  CHOLECYSTECTOMY W/ CHOLANGIOGRAPHY  2012   Dr Magnus Ivan   NASAL SINUS SURGERY     OPEN REDUCTION INTERNAL FIXATION (ORIF) DISTAL RADIAL FRACTURE Left 03/20/2021   Procedure: OPEN REDUCTION INTERNAL FIXATION (ORIF) DISTAL RADIAL FRACTURE;  Surgeon: Teryl Lucy, MD;  Location: [facility] OR;  Service: Orthopedics;  Laterality: Left;   RADIOLOGY WITH ANESTHESIA N/A 05/09/2014   Procedure: ADULT SEDATION WITH ANESTHESIA/MRI CERVICAL SPINE WITHOUT CONTRAST;  Surgeon: Medication Radiologist, MD;  Location: [facility] OR;  Service: Radiology;  Laterality: N/A;  DR. HAWKS/MRI   right knee arthroscopy     d/t meniscal tear   SHOULDER ARTHROSCOPY W/ ROTATOR CUFF REPAIR Bilateral three times each over several yrs   SKIN FULL THICKNESS GRAFT Right 03/12/2022   Procedure: SKIN GRAFT FULL THICKNESS;  Surgeon: Nadara Mustard, MD;  Location: [facility] OR;  Service: Orthopedics;  Laterality: Right;   SKIN SPLIT GRAFT Right 02/17/2022   Procedure: RIGHT LEG SKIN GRAFT;  Surgeon: Nadara Mustard, MD;  Location: [facility] OR;  Service: Orthopedics;  Laterality: Right;   THUMB ARTHROSCOPY Left    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   Procedure: RIGHT TOTAL KNEE ARTHROPLASTY;  Surgeon: Jodi Geralds, MD;  Location: [facility] OR;  Service: Orthopedics;  Laterality: Right;   UMBILICAL HERNIA REPAIR N/A 07/15/2014   Procedure: LAPAROSCOPIC UMBILICAL AND INFRAUMBILICAL HERNIA;  Surgeon: Ardeth Sportsman, MD;  Location: [facility] OR;  Service: General;  Laterality: N/A;   Social History   Occupational History   Not on file  Tobacco Use   Smoking status: Former    Current packs/day: 0.00    Average packs/day: 0.5 packs/day for 2.0 years (1.0 ttl pk-yrs)    Types: Cigarettes    Start date: 02/06/1990    Quit date: 02/07/1992    Years since quitting: 31.9   Smokeless tobacco: Never  Vaping Use   Vaping status: Never Used  Substance and Sexual Activity   Alcohol use: No   Drug use: No   Sexual activity: Not on file

## 2023-12-26 NOTE — Telephone Encounter (Signed)
Patient scheduled to see Dr.Vu on 1/29. Patient unable to come in any sooner.

## 2023-12-28 ENCOUNTER — Ambulatory Visit: Payer: Medicare Other | Admitting: Internal Medicine

## 2023-12-28 ENCOUNTER — Encounter: Payer: Self-pay | Admitting: Internal Medicine

## 2023-12-28 ENCOUNTER — Other Ambulatory Visit: Payer: Self-pay

## 2023-12-28 VITALS — BP 163/61 | HR 85 | Temp 97.1°F

## 2023-12-28 DIAGNOSIS — D721 Eosinophilia, unspecified: Secondary | ICD-10-CM | POA: Diagnosis not present

## 2023-12-28 DIAGNOSIS — M869 Osteomyelitis, unspecified: Secondary | ICD-10-CM

## 2023-12-28 NOTE — Patient Instructions (Signed)
Stop amoxicillin-clav today   Labs today   See dr Daiva Eves in 2-3 weeks to evaluate if rash gone or still there and further evaluation for rash or if you want to restart/change antibiotics

## 2023-12-28 NOTE — Progress Notes (Signed)
Subjective:  Chief complaint follow-up for osteomyelitis on Augmentin   [name], female    DOB: March 02, 1941, 83 y.o.   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 Lajoyce Corners eventually transitioned over to oral Augmentin which she has been able to tolerate with premedication with Zofran.  Her wound is continue to improve.  She did have some pain last week in her leg which resolved.  She does have comorbid rheumatoid arthritis and is not on any disease modifying medications.   ---------- 12/28/23 id clinic f/u Patient had developed a rash and here for evaluation She is taking amox-clav for chronic tibial/calcaneal dm foot osteomyelitis  09/12/23-10/24/23 6 weeks of dapto and piptazo  11/25-c amox-clav  Rash she said is 1 month into the amox-clav. 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   Depression    GERD (gastroesophageal reflux disease)    takes Omeprazole daily   Heart murmur    mild MS, moderate-severe AS 03/17/21 echo   History of bronchitis    20+ yrs ago   History of kidney stones    3 surgerical removed, 1 passed   History of prolapse of bladder    History of  shingles    Hypertension    takes Losartan daily   Hypothyroidism    takes Synthroid daily   Joint swelling    Neck pain    bone spurs at base of head per pt   Osteoarthritis    lumbar,cervical,joints   Pneumonia    hx of > 20 yrs ago   Rheumatoid arthritis (HCC) 12/19/2023   Shortness of breath    occasionally and with exertion. 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HCC)    Unsteady gait    occasionally   Urinary urgency     Past Surgical History:  Procedure Laterality Date   ABDOMINAL AORTOGRAM W/LOWER EXTREMITY N/A 02/15/2022   Procedure: ABDOMINAL AORTOGRAM W/LOWER EXTREMITY;  Surgeon: Maeola Harman, MD;  Location: [facility] INVASIVE CV LAB;  Service: Cardiovascular;  Laterality: N/A;   ABDOMINAL AORTOGRAM W/LOWER EXTREMITY N/A 09/12/2023   Procedure: ABDOMINAL AORTOGRAM W/LOWER EXTREMITY;  Surgeon: Victorino Sparrow, MD;  Location: [facility] INVASIVE CV LAB;  Service: Cardiovascular;  Laterality: N/A;   ABDOMINAL HYSTERECTOMY     ANTERIOR FUSION CERVICAL SPINE     x2 -C4-7   APPLICATION OF WOUND VAC Right 03/12/2022   Procedure: APPLICATION OF WOUND VAC;  Surgeon: Nadara Mustard, MD;  Location: [facility] OR;  Service: Orthopedics;  Laterality: Right;   BUNIONECTOMY Bilateral    COLONOSCOPY  CYSTOSCOPY W/ URETEROSCOPY  2012   EYE SURGERY Bilateral    cataract /lens implant   HOLMIUM LASER APPLICATION Left 02/08/2013   Procedure: HOLMIUM LASER APPLICATION;  Surgeon: Anner Crete, MD;  Location: [facility];  Service: Urology;  Laterality: Left;   I & D EXTREMITY Right 02/10/2022   Procedure: DEBRIDEMENT RIGHT LEG ABSCESS;  Surgeon: Nadara Mustard, MD;  Location: [facility] OR;  Service: Orthopedics;  Laterality: Right;   I & D EXTREMITY Right 03/12/2022   Procedure: RIGHT LEG IRRIGATION AND DEBRIDEMENT EXTREMITY;  Surgeon: Nadara Mustard, MD;  Location: [facility] OR;  Service: Orthopedics;  Laterality: Right;   INSERTION OF MESH N/A  07/15/2014   Procedure: INSERTION OF MESH;  Surgeon: Ardeth Sportsman, MD;  Location: [facility] OR;  Service: General;  Laterality: N/A;   JOINT REPLACEMENT Right 2012   shoulder   LAPAROSCOPIC CHOLECYSTECTOMY W/ CHOLANGIOGRAPHY  2012   Dr Magnus Ivan   NASAL SINUS SURGERY     OPEN REDUCTION INTERNAL FIXATION (ORIF) DISTAL RADIAL FRACTURE Left 03/20/2021   Procedure: OPEN REDUCTION INTERNAL FIXATION (ORIF) DISTAL RADIAL FRACTURE;  Surgeon: Teryl Lucy, MD;  Location: [facility] OR;  Service: Orthopedics;  Laterality: Left;   RADIOLOGY WITH ANESTHESIA N/A 05/09/2014   Procedure: ADULT SEDATION WITH ANESTHESIA/MRI CERVICAL SPINE WITHOUT CONTRAST;  Surgeon: Medication Radiologist, MD;  Location: [facility] OR;  Service: Radiology;  Laterality: N/A;  DR. HAWKS/MRI   right knee arthroscopy     d/t meniscal tear   SHOULDER ARTHROSCOPY W/ ROTATOR CUFF REPAIR Bilateral three times each over several yrs   SKIN FULL THICKNESS GRAFT Right 03/12/2022   Procedure: SKIN GRAFT FULL THICKNESS;  Surgeon: Nadara Mustard, MD;  Location: [facility] OR;  Service: Orthopedics;  Laterality: Right;   SKIN SPLIT GRAFT Right 02/17/2022   Procedure: RIGHT LEG SKIN GRAFT;  Surgeon: Nadara Mustard, MD;  Location: [facility] OR;  Service: Orthopedics;  Laterality: Right;   THUMB ARTHROSCOPY Left    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   Procedure: RIGHT TOTAL KNEE ARTHROPLASTY;  Surgeon: Jodi Geralds, MD;  Location: [facility] OR;  Service: Orthopedics;  Laterality: Right;   UMBILICAL HERNIA REPAIR N/A 07/15/2014   Procedure: LAPAROSCOPIC UMBILICAL AND INFRAUMBILICAL HERNIA;  Surgeon: Ardeth Sportsman, MD;  Location: [facility] OR;  Service: General;  Laterality: N/A;    Family History  Problem Relation Age of Onset   Stroke Father       Social History   Socioeconomic History   Marital status: Divorced    Spouse name: Not on file   Number of children: Not on file   Years of education: Not on file   Highest education level: Not on file  Occupational History   Not on  file  Tobacco Use   Smoking status: Former    Current packs/day: 0.00    Average packs/day: 0.5 packs/day for 2.0 years (1.0 ttl pk-yrs)    Types: Cigarettes    Start date: 02/06/1990    Quit date: 02/07/1992    Years since quitting: 31.9   Smokeless tobacco: Never  Vaping Use   Vaping status: Never Used  Substance and Sexual Activity   Alcohol use: No   Drug use: No   Sexual activity: Not on file  Other Topics Concern   Not on file  Social History Narrative   Not on file   Social Drivers of Health   Financial Resource Strain: Low Risk  (05/02/2023)   Received from [facility]   Overall Financial  Resource Strain (CARDIA)    Difficulty of Paying Living Expenses: Not very hard  Food Insecurity: No Food Insecurity (05/02/2023)   Received from [facility]   Hunger Vital Sign    Worried About Running Out of Food in the Last Year: Never true    Ran Out of Food in the Last Year: Never true  Transportation Needs: No Transportation Needs (05/02/2023)   Received from [facility] - Transportation    Lack of Transportation (Medical): No    Lack of Transportation (Non-Medical): No  Physical Activity: Unknown (05/02/2023)   Received from [facility]   Exercise Vital Sign    Days of Exercise per Week: 0 days    Minutes of Exercise per Session: Not on file  Stress: No Stress Concern Present (05/02/2023)   Received from [facility] - Occupational Stress Questionnaire    Feeling of Stress : Only a little  Social Connections: Moderately Integrated (05/02/2023)   Received from [facility]   Social Network    How would you rate your social network (family, work, friends)?: Adequate participation with social networks    Allergies  Allergen Reactions   Dilaudid [Hydromorphone Hcl] Shortness Of Breath   Gabapentin Other (See Comments)    Hoarseness , headache and sore throat   Latex Rash    Severe rash   Lyrica [Pregabalin] Other  (See Comments)    No balance , had to walk with cane , Blurred vision,weakness.   Oxycodone Shortness Of Breath and Other (See Comments)    Tolerated oxycodone during admission to hospital 02/06/22   Singulair [Montelukast] Shortness Of Breath and Other (See Comments)    Vision issues, also   Ciprofloxacin Hcl Nausea And Vomiting and Other (See Comments)    Nausea and vomiting with by mouth form   Codeine Other (See Comments)    Hallucinations   Methadone Nausea And Vomiting and Other (See Comments)    Severe nausea and vomiting   Metronidazole Nausea And Vomiting and Other (See Comments)    Gastric pain   Oysters [Shellfish Allergy] Other (See Comments)    "Terrible gastric upset and cramping."   Clindamycin/Lincomycin Diarrhea and Nausea Only   Donepezil Diarrhea and Other (See Comments)    Severe diarrhea   Sulfa Antibiotics Diarrhea and Other (See Comments)    GI issues    Tape Other (See Comments)    ADHESIVE TAPE-Severe rash   Zetia [Ezetimibe] Diarrhea   Elemental Sulfur Nausea And Vomiting   Iodine Rash   Other Rash    All Antibiotic ointments/ creams   Oyster Shell Rash   Povidone Iodine Rash and Other (See Comments)    Oyster shell products- Rash    Skintegrity Hydrogel [Skin Protectants, Misc.] Rash   Tapentadol Other (See Comments)    Nightmares **Nucynta**     Current Outpatient Medications:    acetaminophen (TYLENOL) 500 MG tablet, Take 2 tablets (1,000 mg total) by mouth 3 (three) times daily. (Patient not taking: Reported on 12/19/2023), Disp: 180 tablet, Rfl: 0   amoxicillin-clavulanate (AUGMENTIN) 875-125 MG tablet, Take 1 tablet by mouth 2 (two) times daily., Disp: 60 tablet, Rfl: 5   aspirin EC 81 MG tablet, Take 1 tablet (81 mg total) by mouth daily. Swallow whole., Disp: 30 tablet, Rfl: 12   cholecalciferol (VITAMIN D) 1000 UNITS tablet, Take 1,000 Units by mouth daily., Disp: , Rfl:    diclofenac sodium (VOLTAREN) 1 % GEL, Apply  2 g topically daily as  needed (for pain)., Disp: , Rfl:    Digestive Enzymes (ENZYME DIGEST) CAPS, Take 1 capsule by mouth daily as needed (For digestive)., Disp: 30 capsule, Rfl: 0   ENSURE PLUS (ENSURE PLUS) LIQD, Take 237 mLs by mouth daily., Disp: , Rfl:    ferrous sulfate 325 (65 FE) MG tablet, Take 1 tablet (325 mg total) by mouth daily with breakfast. (Patient not taking: Reported on 09/07/2023), Disp: 30 tablet, Rfl: 0   fish oil-omega-3 fatty acids 1000 MG capsule, Take 1,000 mg by mouth 2 (two) times daily. , Disp: , Rfl:    HYDROcodone-acetaminophen (NORCO) 10-325 MG tablet, Take 1 tablet by mouth in the morning, at noon, and at bedtime., Disp: , Rfl:    Lactobacillus (PROBIOTIC ACIDOPHILUS PO), Take 1 capsule by mouth daily., Disp: , Rfl:    levothyroxine (SYNTHROID) 100 MCG tablet, Take 1 tablet (100 mcg total) by mouth every morning., Disp: 30 tablet, Rfl: 0   loperamide (IMODIUM A-D) 2 MG tablet, Take 4 mg by mouth 3 (three) times daily as needed for diarrhea or loose stools., Disp: , Rfl:    loratadine (CLARITIN) 10 MG tablet, Take 1 tablet (10 mg total) by mouth daily., Disp: 30 tablet, Rfl: 0   losartan (COZAAR) 25 MG tablet, Take 1 tablet (25 mg total) by mouth daily. (Patient taking differently: Take 100 mg by mouth daily.), Disp: 30 tablet, Rfl: 0   Multiple Vitamins-Minerals (MULTIVITAMIN PO), Take 1 tablet by mouth daily., Disp: , Rfl:    ondansetron (ZOFRAN) 4 MG tablet, Take 1 tablet (4 mg total) by mouth 2 (two) times daily. Take before each dose of augmentin and can take again if needed up to 3 doses per day, Disp: 90 tablet, Rfl: 3   polyethylene glycol (MIRALAX / GLYCOLAX) 17 g packet, Take 17 g by mouth daily. (Patient not taking: Reported on 10/24/2023), Disp: 14 each, Rfl: 2   pravastatin (PRAVACHOL) 40 MG tablet, Take 1 tablet (40 mg total) by mouth daily at 6 PM., Disp: 30 tablet, Rfl: 0   RESTASIS 0.05 % ophthalmic emulsion, Place 1 drop into both eyes daily., Disp: 0.4 mL, Rfl: 0    sertraline (ZOLOFT) 50 MG tablet, Take 50 mg by mouth daily as needed (depression, sleep)., Disp: , Rfl:    silver sulfADIAZINE (SILVADENE) 1 % cream, Apply 1 Application topically 2 (two) times daily., Disp: , Rfl:    tetrahydrozoline 0.05 % ophthalmic solution, Place 1 drop into both eyes 4 (four) times daily., Disp: , Rfl:    triamcinolone (NASACORT) 55 MCG/ACT AERO nasal inhaler, Place 1-2 sprays into the nose 2 (two) times daily as needed (for seasonal allergies)., Disp: 1 each, Rfl: 3   Turmeric 500 MG CAPS, Take 1 capsule by mouth at bedtime., Disp: , Rfl:    Vitamin D-Vitamin K (K2 PLUS D3) [phone] MCG-UNIT TABS, Take 1 tablet by mouth daily., Disp: , Rfl:     Review of Systems  Constitutional:  Negative for activity change, appetite change, chills, diaphoresis, fatigue, fever and unexpected weight change.  HENT:  Negative for congestion, rhinorrhea, sinus pressure, sneezing, sore throat and trouble swallowing.   Eyes:  Negative for photophobia and visual disturbance.  Respiratory:  Negative for cough, chest tightness, shortness of breath, wheezing and stridor.   Cardiovascular:  Negative for chest pain, palpitations and leg swelling.  Gastrointestinal:  Negative for abdominal distention, abdominal pain, anal bleeding, blood in stool, constipation, diarrhea, nausea and vomiting.  Genitourinary:  Negative for difficulty urinating, dysuria, flank pain and hematuria.  Musculoskeletal:  Negative for arthralgias, back pain, gait problem, joint swelling and myalgias.  Skin:  Positive for wound. Negative for color change, pallor and rash.  Neurological:  Negative for dizziness, tremors, weakness and light-headedness.  Hematological:  Negative for adenopathy. Does not bruise/bleed easily.  Psychiatric/Behavioral:  Negative for agitation, behavioral problems, confusion, decreased concentration, dysphoric mood and sleep disturbance.        Objective:   Physical Exam Constitutional:       General: She is not in acute distress.    Appearance: Normal appearance. She is well-developed. She is not ill-appearing or diaphoretic.  HENT:     Head: Normocephalic and atraumatic.     Right Ear: Hearing and external ear normal.     Left Ear: Hearing and external ear normal.     Nose: No nasal deformity or rhinorrhea.  Eyes:     General: No scleral icterus.    Conjunctiva/sclera: Conjunctivae normal.     Right eye: Right conjunctiva is not injected.     Left eye: Left conjunctiva is not injected.     Pupils: Pupils are equal, round, and reactive to light.  Neck:     Vascular: No JVD.  Cardiovascular:     Rate and Rhythm: Normal rate and regular rhythm.     Heart sounds: Normal heart sounds, S1 normal and S2 normal. No murmur heard.    No friction rub.  Abdominal:     General: Bowel sounds are normal. There is no distension.     Palpations: Abdomen is soft.     Tenderness: There is no abdominal tenderness.  Musculoskeletal:        General: Normal range of motion.     Right shoulder: Normal.     Left shoulder: Normal.     Cervical back: Normal range of motion and neck supple.     Right hip: Normal.     Left hip: Normal.     Right knee: Normal.     Left knee: Normal.  Lymphadenopathy:     Head:     Right side of head: No submandibular, preauricular or posterior auricular adenopathy.     Left side of head: No submandibular, preauricular or posterior auricular adenopathy.     Cervical: No cervical adenopathy.     Right cervical: No superficial or deep cervical adenopathy.    Left cervical: No superficial or deep cervical adenopathy.  Skin:    General: Skin is warm and dry.     Coloration: Skin is not pale.     Findings: No abrasion, bruising, ecchymosis, erythema, lesion or rash.     Nails: There is no clubbing.  Neurological:     Mental Status: She is alert and oriented to person, place, and time.     Sensory: No sensory deficit.     Coordination: Coordination normal.      Gait: Gait normal.  Psychiatric:        Attention and Perception: She is attentive.        Speech: Speech normal.        Behavior: Behavior normal. Behavior is cooperative.        Thought Content: Thought content normal.        Judgment: Judgment normal.    -patient in wheel chair; no distress -hands with stigmata of RA -bilateral LE edema in compression wrap -normal respiratory effort -rash multiple scabbing on trunks; no macular rash  Assessment & Plan:   83 yo female with RA, pvd, and tibial/calcaneal om  She has been on several months of antibiotics. Her last crp was normal I am not certain what additional benefit at this time and given rash and 12/19/23 very elevated eosinophilia that I will stop amox-clav  Will check another set of labs today including lft If there is concern for dress will start steroid  She has an open wound rle in setting bilateral LE edema. She can f/u with dr Lajoyce Corners for ongoing wound care  F/u 2 weeks with dr Daiva Eves. Restarting or changing abx at that time will be discussed. Patient appears to accept the fact that the infection likely wont' resolve, and  might not want further abx

## 2023-12-29 ENCOUNTER — Encounter: Payer: Self-pay | Admitting: Internal Medicine

## 2023-12-29 LAB — CBC WITH DIFFERENTIAL/PLATELET
Absolute Lymphocytes: 800 {cells}/uL — ABNORMAL LOW (ref 850–3900)
Absolute Monocytes: 650 {cells}/uL (ref 200–950)
Basophils Absolute: 80 {cells}/uL (ref 0–200)
Basophils Relative: 1.6 %
Eosinophils Absolute: 860 {cells}/uL — ABNORMAL HIGH (ref 15–500)
Eosinophils Relative: 17.2 %
HCT: 32.8 % — ABNORMAL LOW (ref 35.0–45.0)
Hemoglobin: 10.6 g/dL — ABNORMAL LOW (ref 11.7–15.5)
MCH: 27.8 pg (ref 27.0–33.0)
MCHC: 32.3 g/dL (ref 32.0–36.0)
MCV: 86.1 fL (ref 80.0–100.0)
MPV: 10.1 fL (ref 7.5–12.5)
Monocytes Relative: 13 %
Neutro Abs: 2610 {cells}/uL (ref 1500–7800)
Neutrophils Relative %: 52.2 %
Platelets: 255 10*3/uL (ref 140–400)
RBC: 3.81 10*6/uL (ref 3.80–5.10)
RDW: 13.4 % (ref 11.0–15.0)
Total Lymphocyte: 16 %
WBC: 5 10*3/uL (ref 3.8–10.8)

## 2023-12-29 LAB — COMPLETE METABOLIC PANEL WITH GFR
AG Ratio: 2.1 (calc) (ref 1.0–2.5)
ALT: 27 U/L (ref 6–29)
AST: 34 U/L (ref 10–35)
Albumin: 4.1 g/dL (ref 3.6–5.1)
Alkaline phosphatase (APISO): 139 U/L (ref 37–153)
BUN: 20 mg/dL (ref 7–25)
CO2: 29 mmol/L (ref 20–32)
Calcium: 9.5 mg/dL (ref 8.6–10.4)
Chloride: 105 mmol/L (ref 98–110)
Creat: 0.61 mg/dL (ref 0.60–0.95)
Globulin: 2 g/dL (ref 1.9–3.7)
Glucose, Bld: 91 mg/dL (ref 65–99)
Potassium: 4.7 mmol/L (ref 3.5–5.3)
Sodium: 141 mmol/L (ref 135–146)
Total Bilirubin: 0.4 mg/dL (ref 0.2–1.2)
Total Protein: 6.1 g/dL (ref 6.1–8.1)
eGFR: 88 mL/min/{1.73_m2} (ref >=60)

## 2024-01-15 ENCOUNTER — Encounter: Payer: Self-pay | Admitting: Infectious Disease

## 2024-01-15 DIAGNOSIS — R21 Rash and other nonspecific skin eruption: Secondary | ICD-10-CM

## 2024-01-15 HISTORY — DX: Rash and other nonspecific skin eruption: R21

## 2024-01-16 ENCOUNTER — Ambulatory Visit: Payer: Medicare Other | Admitting: Infectious Disease

## 2024-01-16 ENCOUNTER — Other Ambulatory Visit: Payer: Self-pay

## 2024-01-16 ENCOUNTER — Encounter: Payer: Self-pay | Admitting: Infectious Disease

## 2024-01-16 VITALS — BP 149/69 | HR 77 | Temp 97.4°F

## 2024-01-16 DIAGNOSIS — M069 Rheumatoid arthritis, unspecified: Secondary | ICD-10-CM

## 2024-01-16 DIAGNOSIS — D721 Eosinophilia, unspecified: Secondary | ICD-10-CM

## 2024-01-16 DIAGNOSIS — M86171 Other acute osteomyelitis, right ankle and foot: Secondary | ICD-10-CM

## 2024-01-16 DIAGNOSIS — L4050 Arthropathic psoriasis, unspecified: Secondary | ICD-10-CM | POA: Diagnosis not present

## 2024-01-16 DIAGNOSIS — M86361 Chronic multifocal osteomyelitis, right tibia and fibula: Secondary | ICD-10-CM | POA: Diagnosis not present

## 2024-01-16 DIAGNOSIS — R21 Rash and other nonspecific skin eruption: Secondary | ICD-10-CM

## 2024-01-16 NOTE — Progress Notes (Signed)
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   MRN: [id number]  HPI  Discussed the use of AI scribe software for clinical note transcription with the patient, who gave verbal consent to proceed.  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 The infections were initially considered for amputation, but the patient declined due to concerns about anesthesia and learning to use a prosthesis. The patient was treated with IV antibiotics, which were later switched to oral antibiotics (Augmentin). 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 The patient reports ongoing discomfort in the affected areas, which is managed with padding. The patient also has vascular disease, which likely contributed to the development of the infections. The patient's caregiver is actively involved in the patient's care, assisting with wound dressing changes and medication management. She also has RA and psoriatic arthritis that cause neck and hand pain but only on symptomatic management.         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   Depression    GERD (gastroesophageal reflux disease)    takes Omeprazole daily   Heart murmur    mild MS, moderate-severe AS  03/17/21 echo   History of bronchitis    20+ yrs ago   History of kidney stones    3 surgerical removed, 1 passed   History of prolapse of bladder    History of shingles    Hypertension    takes Losartan daily   Hypothyroidism    takes Synthroid daily   Joint swelling    Neck pain    bone spurs at base of head per pt   Osteoarthritis    lumbar,cervical,joints   Pneumonia    hx of > 20 yrs ago   Rash 01/15/2024   Rheumatoid arthritis (HCC) 12/19/2023   Shortness of breath    occasionally and with exertion. 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HCC)    Unsteady gait    occasionally   Urinary urgency     Past Surgical History:  Procedure Laterality Date   ABDOMINAL AORTOGRAM W/LOWER EXTREMITY N/A 02/15/2022   Procedure: ABDOMINAL AORTOGRAM W/LOWER EXTREMITY;  Surgeon: Maeola Harman, MD;  Location[facility] INVASIVE CV LAB;  Service: Cardiovascular;  Laterality: N/A;   ABDOMINAL AORTOGRAM W/LOWER EXTREMITY N/A 09/12/2023   Procedure: ABDOMINAL AORTOGRAM W/LOWER EXTREMITY;  Surgeon: Victorino Sparrow, MD;  Location: [facility] INVASIVE CV LAB;  Service: Cardiovascular;  Laterality: N/A;   ABDOMINAL HYSTERECTOMY     ANTERIOR FUSION CERVICAL SPINE     x2 -C4-7   APPLICATION OF WOUND VAC Right 03/12/2022   Procedure: APPLICATION OF WOUND VAC;  Surgeon: Nadara Mustard, MD;  Location: [facility] OR;  Service: Orthopedics;  Laterality: Right;   BUNIONECTOMY Bilateral    COLONOSCOPY     CYSTOSCOPY W/ URETEROSCOPY  2012   EYE SURGERY Bilateral    cataract /lens implant   HOLMIUM LASER APPLICATION Left 02/08/2013   Procedure: HOLMIUM LASER APPLICATION;  Surgeon: Anner Crete, MD;  Location[facility];  Service: Urology;  Laterality: Left;   I & D EXTREMITY Right 02/10/2022   Procedure: DEBRIDEMENT RIGHT LEG ABSCESS;  Surgeon: Nadara Mustard, MD;  Location: [facility] OR;  Service: Orthopedics;  Laterality: Right;   I & D EXTREMITY Right  03/12/2022   Procedure: RIGHT LEG IRRIGATION AND DEBRIDEMENT EXTREMITY;  Surgeon: Nadara Mustard, MD;  Location: [facility] OR;  Service: Orthopedics;  Laterality: Right;   INSERTION OF MESH N/A 07/15/2014   Procedure: INSERTION OF MESH;  Surgeon: Ardeth Sportsman, MD;  Location: [facility] OR;  Service: General;  Laterality: N/A;   JOINT REPLACEMENT Right 2012   shoulder   LAPAROSCOPIC CHOLECYSTECTOMY W/ CHOLANGIOGRAPHY  2012   Dr Magnus Ivan   NASAL SINUS SURGERY     OPEN REDUCTION INTERNAL FIXATION (ORIF) DISTAL RADIAL FRACTURE Left 03/20/2021   Procedure: OPEN REDUCTION INTERNAL FIXATION (ORIF) DISTAL RADIAL FRACTURE;  Surgeon: Teryl Lucy, MD;  Location: [facility] OR;  Service: Orthopedics;  Laterality: Left;   RADIOLOGY WITH ANESTHESIA N/A 05/09/2014   Procedure: ADULT SEDATION WITH ANESTHESIA/MRI CERVICAL SPINE WITHOUT CONTRAST;  Surgeon: Medication Radiologist, MD;  Location: [facility] OR;  Service: Radiology;  Laterality: N/A;  DR. HAWKS/MRI   right knee arthroscopy     d/t meniscal tear   SHOULDER ARTHROSCOPY W/ ROTATOR CUFF REPAIR Bilateral three times each over several yrs   SKIN FULL THICKNESS GRAFT Right 03/12/2022   Procedure: SKIN GRAFT FULL THICKNESS;  Surgeon: Nadara Mustard, MD;  Location: [facility] OR;  Service: Orthopedics;  Laterality: Right;   SKIN SPLIT GRAFT Right 02/17/2022   Procedure: RIGHT LEG SKIN GRAFT;  Surgeon: Nadara Mustard, MD;  Location: [facility] OR;  Service: Orthopedics;  Laterality: Right;   THUMB ARTHROSCOPY Left    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   Procedure: RIGHT TOTAL KNEE ARTHROPLASTY;  Surgeon: Jodi Geralds, MD;  Location: [facility] OR;  Service: Orthopedics;  Laterality: Right;   UMBILICAL HERNIA REPAIR N/A 07/15/2014   Procedure: LAPAROSCOPIC UMBILICAL AND INFRAUMBILICAL HERNIA;  Surgeon: Ardeth Sportsman, MD;  Location: [facility] OR;  Service: General;  Laterality: N/A;    Family History  Problem Relation Age of Onset   Stroke Father       Social History   Socioeconomic History   Marital  status: Divorced    Spouse name: Not on file   Number of children: Not on file   Years of education: Not on file   Highest education level: Not on file  Occupational History   Not on file  Tobacco Use   Smoking status: Former    Current packs/day: 0.00    Average packs/day: 0.5 packs/day for 2.0 years (1.0 ttl pk-yrs)    Types: Cigarettes    Start date: 02/06/1990    Quit date: 02/07/1992    Years since quitting: 31.9   Smokeless tobacco: Never  Vaping Use   Vaping status: Never Used  Substance and Sexual Activity   Alcohol use: No   Drug use: No   Sexual activity: Not on file  Other Topics Concern   Not on file  Social History Narrative   Not  on file   Social Drivers of Health   Financial Resource Strain: Low Risk  (05/02/2023)   Received from [facility]   Overall Financial Resource Strain (CARDIA)    Difficulty of Paying Living Expenses: Not very hard  Food Insecurity: No Food Insecurity (05/02/2023)   [facility]   Hunger Vital Sign    Worried About Running Out of Food in the Last Year: Never true    Ran Out of Food in the Last Year: Never true  Transportation Needs: No Transportation Needs (05/02/2023)   Received from [facility] - Transportation    Lack of Transportation (Medical): No    Lack of Transportation (Non-Medical): No  Physical Activity: Unknown (05/02/2023)   Received from [facility]   Exercise Vital Sign    Days of Exercise per Week: 0 days    Minutes of Exercise per Session: Not on file  Stress: No Stress Concern Present (05/02/2023)   Received from [facility] - Occupational Stress Questionnaire    Feeling of Stress : Only a little  Social Connections: Moderately Integrated (05/02/2023)   Received from [facility]   Social Network    How would you rate your social network (family, work, friends)?: Adequate participation with social networks    Allergies  Allergen Reactions    Dilaudid [Hydromorphone Hcl] Shortness Of Breath   Gabapentin Other (See Comments)    Hoarseness , headache and sore throat   Latex Rash    Severe rash   Lyrica [Pregabalin] Other (See Comments)    No balance , had to walk with cane , Blurred vision,weakness.   Oxycodone Shortness Of Breath and Other (See Comments)    Tolerated oxycodone during admission to hospital 02/06/22   Singulair [Montelukast] Shortness Of Breath and Other (See Comments)    Vision issues, also   Ciprofloxacin Hcl Nausea And Vomiting and Other (See Comments)    Nausea and vomiting with by mouth form   Codeine Other (See Comments)    Hallucinations   Methadone Nausea And Vomiting and Other (See Comments)    Severe nausea and vomiting   Metronidazole Nausea And Vomiting and Other (See Comments)    Gastric pain   Oysters [Shellfish Allergy] Other (See Comments)    "Terrible gastric upset and cramping."   Clindamycin/Lincomycin Diarrhea and Nausea Only   Donepezil Diarrhea and Other (See Comments)    Severe diarrhea   Povidone-Iodine Other (See Comments)   Sulfa Antibiotics Diarrhea and Other (See Comments)    GI issues    Tape Other (See Comments)    ADHESIVE TAPE-Severe rash   Zetia [Ezetimibe] Diarrhea   Elemental Sulfur Nausea And Vomiting   Iodine Rash   Other Rash    All Antibiotic ointments/ creams   Oyster Shell Rash   Povidone Iodine Rash and Other (See Comments)    Oyster shell products- Rash    Skintegrity Hydrogel [Skin Protectants, Misc.] Rash   Tapentadol Other (See Comments)    Nightmares **Nucynta**     Current Outpatient Medications:    acetaminophen (TYLENOL) 500 MG tablet, Take 2 tablets (1,000 mg total) by mouth 3 (three) times daily., Disp: 180 tablet, Rfl: 0   amoxicillin-clavulanate (AUGMENTIN) 875-125 MG tablet, Take 1 tablet by mouth 2 (two) times daily., Disp: 60 tablet, Rfl: 5   aspirin EC 81 MG tablet, Take 1 tablet (81 mg total) by mouth daily. Swallow whole., Disp: 30  tablet, Rfl:  12   cholecalciferol (VITAMIN D) 1000 UNITS tablet, Take 1,000 Units by mouth daily., Disp: , Rfl:    diclofenac sodium (VOLTAREN) 1 % GEL, Apply 2 g topically daily as needed (for pain)., Disp: , Rfl:    Digestive Enzymes (ENZYME DIGEST) CAPS, Take 1 capsule by mouth daily as needed (For digestive)., Disp: 30 capsule, Rfl: 0   ENSURE PLUS (ENSURE PLUS) LIQD, Take 237 mLs by mouth daily., Disp: , Rfl:    ferrous sulfate 325 (65 FE) MG tablet, Take 1 tablet (325 mg total) by mouth daily with breakfast., Disp: 30 tablet, Rfl: 0   fish oil-omega-3 fatty acids 1000 MG capsule, Take 1,000 mg by mouth 2 (two) times daily. , Disp: , Rfl:    HYDROcodone-acetaminophen (NORCO) 10-325 MG tablet, Take 1 tablet by mouth in the morning, at noon, and at bedtime., Disp: , Rfl:    Lactobacillus (PROBIOTIC ACIDOPHILUS PO), Take 1 capsule by mouth daily., Disp: , Rfl:    levothyroxine (SYNTHROID) 100 MCG tablet, Take 1 tablet (100 mcg total) by mouth every morning., Disp: 30 tablet, Rfl: 0   loperamide (IMODIUM A-D) 2 MG tablet, Take 4 mg by mouth 3 (three) times daily as needed for diarrhea or loose stools., Disp: , Rfl:    loratadine (CLARITIN) 10 MG tablet, Take 1 tablet (10 mg total) by mouth daily., Disp: 30 tablet, Rfl: 0   losartan (COZAAR) 25 MG tablet, Take 1 tablet (25 mg total) by mouth daily. (Patient taking differently: Take 100 mg by mouth daily.), Disp: 30 tablet, Rfl: 0   methocarbamol (ROBAXIN) 500 MG tablet, Take 500 mg by mouth every 6 (six) hours as needed for muscle spasms., Disp: , Rfl:    Multiple Vitamins-Minerals (MULTIVITAMIN PO), Take 1 tablet by mouth daily., Disp: , Rfl:    ondansetron (ZOFRAN) 4 MG tablet, Take 1 tablet (4 mg total) by mouth 2 (two) times daily. Take before each dose of augmentin and can take again if needed up to 3 doses per day, Disp: 90 tablet, Rfl: 3   polyethylene glycol (MIRALAX / GLYCOLAX) 17 g packet, Take 17 g by mouth daily., Disp: 14 each, Rfl: 2    RESTASIS 0.05 % ophthalmic emulsion, Place 1 drop into both eyes daily., Disp: 0.4 mL, Rfl: 0   silver sulfADIAZINE (SILVADENE) 1 % cream, Apply 1 Application topically 2 (two) times daily., Disp: , Rfl:    tetrahydrozoline 0.05 % ophthalmic solution, Place 1 drop into both eyes 4 (four) times daily., Disp: , Rfl:    triamcinolone (NASACORT) 55 MCG/ACT AERO nasal inhaler, Place 1-2 sprays into the nose 2 (two) times daily as needed (for seasonal allergies)., Disp: 1 each, Rfl: 3   Turmeric 500 MG CAPS, Take 1 capsule by mouth at bedtime., Disp: , Rfl:    Vitamin D-Vitamin K (K2 PLUS D3) [phone] MCG-UNIT TABS, Take 1 tablet by mouth daily., Disp: , Rfl:    Review of Systems  Constitutional:  Negative for activity change, appetite change, chills, diaphoresis, fatigue, fever and unexpected weight change.  HENT:  Negative for congestion, rhinorrhea, sinus pressure, sneezing, sore throat and trouble swallowing.   Eyes:  Negative for photophobia and visual disturbance.  Respiratory:  Negative for cough, chest tightness, shortness of breath, wheezing and stridor.   Cardiovascular:  Negative for chest pain, palpitations and leg swelling.  Gastrointestinal:  Negative for abdominal distention, abdominal pain, anal bleeding, blood in stool, constipation, diarrhea, nausea and vomiting.  Genitourinary:  Negative for difficulty urinating, dysuria,  flank pain and hematuria.  Musculoskeletal:  Positive for arthralgias and myalgias. Negative for back pain, gait problem and joint swelling.  Skin:  Negative for color change, pallor, rash and wound.  Neurological:  Negative for dizziness, tremors, weakness and light-headedness.  Hematological:  Negative for adenopathy. Does not bruise/bleed easily.  Psychiatric/Behavioral:  Negative for agitation, behavioral problems, confusion, decreased concentration, dysphoric mood and sleep disturbance.        Objective:   Physical Exam Constitutional:      General: She  is not in acute distress.    Appearance: Normal appearance. She is well-developed. She is not ill-appearing or diaphoretic.  HENT:     Head: Normocephalic and atraumatic.     Right Ear: Hearing and external ear normal.     Left Ear: Hearing and external ear normal.     Nose: No nasal deformity or rhinorrhea.  Eyes:     General: No scleral icterus.    Conjunctiva/sclera: Conjunctivae normal.     Right eye: Right conjunctiva is not injected.     Left eye: Left conjunctiva is not injected.     Pupils: Pupils are equal, round, and reactive to light.  Neck:     Vascular: No JVD.  Cardiovascular:     Rate and Rhythm: Normal rate and regular rhythm.     Heart sounds: Normal heart sounds, S1 normal and S2 normal. No murmur heard.    No friction rub.  Abdominal:     General: Bowel sounds are normal. There is no distension.     Palpations: Abdomen is soft.     Tenderness: There is no abdominal tenderness.  Musculoskeletal:        General: Normal range of motion.     Right shoulder: Normal.     Left shoulder: Normal.     Cervical back: Normal range of motion and neck supple.     Right hip: Normal.     Left hip: Normal.     Right knee: Normal.     Left knee: Normal.  Lymphadenopathy:     Head:     Right side of head: No submandibular, preauricular or posterior auricular adenopathy.     Left side of head: No submandibular, preauricular or posterior auricular adenopathy.     Cervical: No cervical adenopathy.     Right cervical: No superficial or deep cervical adenopathy.    Left cervical: No superficial or deep cervical adenopathy.  Skin:    General: Skin is warm and dry.     Coloration: Skin is not pale.     Findings: No abrasion, bruising, ecchymosis, erythema, lesion or rash.     Nails: There is no clubbing.  Neurological:     Mental Status: She is alert and oriented to person, place, and time.     Sensory: No sensory deficit.     Coordination: Coordination normal.     Gait: Gait  normal.  Psychiatric:        Attention and Perception: She is attentive.        Mood and Affect: Mood normal.        Speech: Speech normal.        Behavior: Behavior normal. Behavior is cooperative.        Thought Content: Thought content normal.        Judgment: Judgment normal.     Wounds 01/16/2024"          Assessment & Plan:   Assessment and Plan    Chronic  Osteomyelitis (Right tibia and calcaneus) Patient previously declined surgical intervention (below the knee amputation). Treated with IV antibiotics (Zosyn and Daptomycin) followed by oral Augmentin. Augmentin was discontinued due to suspected adverse reaction (eosinophilia and rash), but later determined to be unrelated. Wounds appear to be improving despite discontinuation of antibiotics. -Keep Augmentin on hand at home for use if wound condition worsens. -Check blood work today to assess current status, ESR, CRP keeping in mind RA psoriatic arthritis could confound -Follow-up in late May 2025.  Peripheral Vascular Disease Likely contributing to development and persistence of osteomyelitis. -No specific plan discussed in this conversation.  RA, psoriatic arthritis: only on symptomatic managment, could confound labs above as mentioned

## 2024-01-17 ENCOUNTER — Telehealth: Payer: Self-pay

## 2024-01-17 LAB — CBC WITH DIFFERENTIAL/PLATELET
Absolute Lymphocytes: 1477 {cells}/uL (ref 850–3900)
Absolute Monocytes: 790 {cells}/uL (ref 200–950)
Basophils Absolute: 142 {cells}/uL (ref 0–200)
Basophils Relative: 1.8 %
Eosinophils Absolute: 1477 {cells}/uL — ABNORMAL HIGH (ref 15–500)
Eosinophils Relative: 18.7 %
HCT: 35.1 % (ref 35.0–45.0)
Hemoglobin: 11.3 g/dL — ABNORMAL LOW (ref 11.7–15.5)
MCH: 27.7 pg (ref 27.0–33.0)
MCHC: 32.2 g/dL (ref 32.0–36.0)
MCV: 86 fL (ref 80.0–100.0)
MPV: 10.1 fL (ref 7.5–12.5)
Monocytes Relative: 10 %
Neutro Abs: 4013 {cells}/uL (ref 1500–7800)
Neutrophils Relative %: 50.8 %
Platelets: 371 10*3/uL (ref 140–400)
RBC: 4.08 10*6/uL (ref 3.80–5.10)
RDW: 13.8 % (ref 11.0–15.0)
Total Lymphocyte: 18.7 %
WBC: 7.9 10*3/uL (ref 3.8–10.8)

## 2024-01-17 LAB — COMPLETE METABOLIC PANEL WITH GFR
AG Ratio: 2 (calc) (ref 1.0–2.5)
ALT: 26 U/L (ref 6–29)
AST: 25 U/L (ref 10–35)
Albumin: 4.2 g/dL (ref 3.6–5.1)
Alkaline phosphatase (APISO): 104 U/L (ref 37–153)
BUN: 23 mg/dL (ref 7–25)
CO2: 28 mmol/L (ref 20–32)
Calcium: 8.6 mg/dL (ref 8.6–10.4)
Chloride: 107 mmol/L (ref 98–110)
Creat: 0.72 mg/dL (ref 0.60–0.95)
Globulin: 2.1 g/dL (ref 1.9–3.7)
Glucose, Bld: 81 mg/dL (ref 65–99)
Potassium: 4.4 mmol/L (ref 3.5–5.3)
Sodium: 142 mmol/L (ref 135–146)
Total Bilirubin: 0.4 mg/dL (ref 0.2–1.2)
Total Protein: 6.3 g/dL (ref 6.1–8.1)
eGFR: 82 mL/min/{1.73_m2} (ref >=60)

## 2024-01-17 LAB — C-REACTIVE PROTEIN: CRP: <3 mg/L (ref <8.0)

## 2024-01-17 LAB — SEDIMENTATION RATE: Sed Rate: 6 mm/h (ref 0–30)

## 2024-01-17 NOTE — Telephone Encounter (Signed)
-----   Message from Paulette Blanch Dam sent at 01/17/2024  4:13 PM EST ----- Labs reasuring ----- Message ----- From: Janace Hoard Lab Results In Sent: 01/16/2024  10:48 PM EST To: Randall Hiss, MD

## 2024-01-30 ENCOUNTER — Ambulatory Visit: Payer: Medicare Other | Admitting: Orthopedic Surgery

## 2024-01-30 DIAGNOSIS — L97914 Non-pressure chronic ulcer of unspecified part of right lower leg with necrosis of bone: Secondary | ICD-10-CM

## 2024-01-30 DIAGNOSIS — I87332 Chronic venous hypertension (idiopathic) with ulcer and inflammation of left lower extremity: Secondary | ICD-10-CM

## 2024-01-30 DIAGNOSIS — I890 Lymphedema, not elsewhere classified: Secondary | ICD-10-CM

## 2024-01-30 DIAGNOSIS — L97912 Non-pressure chronic ulcer of unspecified part of right lower leg with fat layer exposed: Secondary | ICD-10-CM

## 2024-01-30 DIAGNOSIS — L97511 Non-pressure chronic ulcer of other part of right foot limited to breakdown of skin: Secondary | ICD-10-CM

## 2024-02-01 ENCOUNTER — Telehealth: Payer: Self-pay | Admitting: Family

## 2024-02-01 ENCOUNTER — Other Ambulatory Visit: Payer: Self-pay | Admitting: Orthopedic Surgery

## 2024-02-01 MED ORDER — PREDNISONE 10 MG PO TABS: 10.0000 mg | ORAL_TABLET | Freq: Every day | ORAL | 0 refills | Status: DC

## 2024-02-01 NOTE — Telephone Encounter (Signed)
 Pt called stating Dr Lajoyce Corners was to send in script of prednisone. Please send to [facility] [address] Pt phone number is [phone].

## 2024-02-01 NOTE — Telephone Encounter (Signed)
 OV note not completed yet. Mentioned pt wanted to try prednisone on paper. Please advise.

## 2024-02-06 ENCOUNTER — Encounter: Payer: Self-pay | Admitting: Orthopedic Surgery

## 2024-02-06 NOTE — Progress Notes (Signed)
 Office Visit Note   Patient: Isabel Barnes           Date of Birth: Mar 09, 1941           MRN: [id number] Visit Date: 01/30/2024              Requested by: Jamal Collin, PA-C [address] [place],  [place] [address] PCP: Jamal Collin, [place]  Chief Complaint  Patient presents with   Right Leg - Follow-up   Left Leg - Follow-up      HPI: Patient is an 83 year old woman who presents with multiple medical issues.  She has ulcerations both lower extremities with venous and lymphatic insufficiency with ulcerations on both feet.  Assessment & Plan: Visit Diagnoses:  1. 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 Chronic ulcer of right leg, with fat layer exposed (HCC)   3. Lymphedema   4. 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  A prescription provided for prednisone for arthritic symptoms.  Follow-Up Instructions: Return in about 4 weeks (around 02/27/2024).   Ortho Exam  Patient is alert, oriented, no adenopathy, well-dressed, normal affect, normal respiratory effort. Examination patient has papillomata's changes with brawny skin color changes and swelling of both lower extremities.  The leg ulcer has fibrinous tissue and measures 9 x 4 cm.  Will have patient use Vashe dressing changes.  Examination the left foot patient has an ulcer on the heel and first metatarsal head.  After informed consent a 10 blade knife was used to debride the skin and soft tissue from both ulcers.  This was debrided back to healthy viable tissue.  Patient was provided a felt relieving donuts to unload pressure from these ulcers.  After debridement each ulcer is 2 cm in diameter 1 mm deep.  Imaging: No results found. No images are attached to the encounter.  Labs: Lab Results  Component Value Date   ESRSEDRATE 6 01/16/2024   ESRSEDRATE 6  12/19/2023   ESRSEDRATE 40 (H) 02/07/2022   CRP <3.0 01/16/2024   CRP 7.6 12/19/2023   CRP 20.5 (H) 02/07/2022   REPTSTATUS 09/12/2023 FINAL 09/07/2023   GRAMSTAIN  02/10/2022    RARE WBC PRESENT, PREDOMINANTLY PMN NO ORGANISMS SEEN    CULT  09/07/2023    NO GROWTH 5 DAYS Performed at [facility], [address], [place], [place] [address]    [facility] ENTEROCOCCUS FAECALIS 02/10/2022     Lab Results  Component Value Date   ALBUMIN 3.5 09/07/2023   ALBUMIN 1.5 (L) 02/09/2022   ALBUMIN 2.1 (L) 02/06/2022    Lab Results  Component Value Date   MG 2.0 02/12/2022   MG 1.8 02/07/2022   Lab Results  Component Value Date   VD25OH 69.88 02/07/2022    No results found for: "PREALBUMIN"    Latest Ref Rng & Units 01/16/2024    1:15 PM 12/28/2023   10:31 AM 12/19/2023   11:38 AM  CBC EXTENDED  WBC 3.8 - 10.8 Thousand/uL 7.9  5.0  5.8   RBC 3.80 - 5.10 Million/uL 4.08  3.81  4.03   Hemoglobin 11.7 - 15.5 g/dL 69.6  29.5  28.4   HCT 35.0 - 45.0 % 35.1  32.8  35.2   Platelets 140 - 400 Thousand/uL 371  255  279   NEUT#  1,500 - 7,800 cells/uL 4,013  2,610  2,622      There is no height or weight on file to calculate BMI.  Orders:  No orders of the defined types were placed in this encounter.  No orders of the defined types were placed in this encounter.    Procedures: No procedures performed  Clinical Data: No additional findings.  ROS:  All other systems negative, except as noted in the HPI. Review of Systems  Objective: Vital Signs: There were no vitals taken for this visit.  Specialty Comments:  No specialty comments available.  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   RLE Wound  R Heel Ulcer 09/08/2023   LLE Cellulitis 09/07/2023   Abscess of right lower  leg    Severe protein-calorie malnutrition (HCC)    Peripheral arterial disease (HCC)    Fall    Atrial fibrillation (HCC)    Goals of care, counseling/discussion    Anemia    Fluid collection (edema) in the arms, legs, hands and feet    Cellulitis of right lower extremity 02/06/2022   Chest pain, rule out acute myocardial infarction 03/16/2021   Hypertension 03/16/2021   Hypothyroidism 03/16/2021   Hyperlipemia 03/16/2021   Aortic valve stenosis 03/16/2021   Chronic venous insufficiency of lower extremity 03/16/2021   Short-term memory loss 12/13/2019   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   Primary osteoarthritis of right knee 07/16/2016   Eosinophilia 06/16/2015   Chronic pain disorder    Osteoarthritis    Incisional umbilical hernia, without obstruction or gangrene    Iron deficiency anemia 09/19/2012   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   Depression    GERD (gastroesophageal reflux disease)    takes Omeprazole daily   Heart murmur    mild MS, moderate-severe AS 03/17/21 echo   History of bronchitis    20+ yrs ago   History of kidney stones    3 surgerical removed, 1 passed   History of prolapse of bladder    History of shingles    Hypertension    takes Losartan daily   Hypothyroidism    takes Synthroid daily   Joint swelling    Neck pain    bone spurs at base of head per pt   Osteoarthritis    lumbar,cervical,joints   Pneumonia    hx of > 20 yrs ago   Rash 01/15/2024   Rheumatoid arthritis (HCC) 12/19/2023   Shortness  of breath    occasionally and with exertion. 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HCC)    Unsteady gait    occasionally   Urinary urgency     Family History  Problem Relation Age of Onset   Stroke Father  Past Surgical History:  Procedure Laterality Date   ABDOMINAL AORTOGRAM W/LOWER EXTREMITY N/A 02/15/2022   Procedure: ABDOMINAL AORTOGRAM W/LOWER EXTREMITY;  Surgeon: Maeola Harman, MD;  Location: [facility] INVASIVE CV LAB;  Service: Cardiovascular;  Laterality: N/A;   ABDOMINAL AORTOGRAM W/LOWER EXTREMITY N/A 09/12/2023   Procedure: ABDOMINAL AORTOGRAM W/LOWER EXTREMITY;  Surgeon: Victorino Sparrow, MD;  Location: [facility] INVASIVE CV LAB;  Service: Cardiovascular;  Laterality: N/A;   ABDOMINAL HYSTERECTOMY     ANTERIOR FUSION CERVICAL SPINE     x2 -C4-7   APPLICATION OF WOUND VAC Right 03/12/2022   Procedure: APPLICATION OF WOUND VAC;  Surgeon: Nadara Mustard, MD;  Location: [facility] OR;  Service: Orthopedics;  Laterality: Right;   BUNIONECTOMY Bilateral    COLONOSCOPY     CYSTOSCOPY W/ URETEROSCOPY  2012   EYE SURGERY Bilateral    cataract /lens implant   HOLMIUM LASER APPLICATION Left 02/08/2013   Procedure: HOLMIUM LASER APPLICATION;  Surgeon: Anner Crete, MD;  Location: [facility];  Service: Urology;  Laterality: Left;   I & D EXTREMITY Right 02/10/2022   Procedure: DEBRIDEMENT RIGHT LEG ABSCESS;  Surgeon: Nadara Mustard, MD;  Location: [facility] OR;  Service: Orthopedics;  Laterality: Right;   I & D EXTREMITY Right 03/12/2022   Procedure: RIGHT LEG IRRIGATION AND DEBRIDEMENT EXTREMITY;  Surgeon: Nadara Mustard, MD;  Location: [facility] OR;  Service: Orthopedics;  Laterality: Right;   INSERTION OF MESH N/A 07/15/2014   Procedure: INSERTION OF MESH;  Surgeon: Ardeth Sportsman, MD;  Location: [facility] OR;  Service: General;  Laterality: N/A;   JOINT REPLACEMENT Right 2012   shoulder   LAPAROSCOPIC CHOLECYSTECTOMY W/ CHOLANGIOGRAPHY  2012    Dr Magnus Ivan   NASAL SINUS SURGERY     OPEN REDUCTION INTERNAL FIXATION (ORIF) DISTAL RADIAL FRACTURE Left 03/20/2021   Procedure: OPEN REDUCTION INTERNAL FIXATION (ORIF) DISTAL RADIAL FRACTURE;  Surgeon: Teryl Lucy, MD;  Location: [facility] OR;  Service: Orthopedics;  Laterality: Left;   RADIOLOGY WITH ANESTHESIA N/A 05/09/2014   Procedure: ADULT SEDATION WITH ANESTHESIA/MRI CERVICAL SPINE WITHOUT CONTRAST;  Surgeon: Medication Radiologist, MD;  Location: [facility] OR;  Service: Radiology;  Laterality: N/A;  DR. HAWKS/MRI   right knee arthroscopy     d/t meniscal tear   SHOULDER ARTHROSCOPY W/ ROTATOR CUFF REPAIR Bilateral three times each over several yrs   SKIN FULL THICKNESS GRAFT Right 03/12/2022   Procedure: SKIN GRAFT FULL THICKNESS;  Surgeon: Nadara Mustard, MD;  Location: [facility] OR;  Service: Orthopedics;  Laterality: Right;   SKIN SPLIT GRAFT Right 02/17/2022   Procedure: RIGHT LEG SKIN GRAFT;  Surgeon: Nadara Mustard, MD;  Location: [facility] OR;  Service: Orthopedics;  Laterality: Right;   THUMB ARTHROSCOPY Left    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   Procedure: RIGHT TOTAL KNEE ARTHROPLASTY;  Surgeon: Jodi Geralds, MD;  Location: [facility] OR;  Service: Orthopedics;  Laterality: Right;   UMBILICAL HERNIA REPAIR N/A 07/15/2014   Procedure: LAPAROSCOPIC UMBILICAL AND INFRAUMBILICAL HERNIA;  Surgeon: Ardeth Sportsman, MD;  Location: [facility] OR;  Service: General;  Laterality: N/A;   Social History   Occupational History   Not on file  Tobacco Use   Smoking status: Former    Current packs/day: 0.00    Average packs/day: 0.5 packs/day for 2.0 years (1.0 ttl pk-yrs)    Types: Cigarettes    Start date: 02/06/1990    Quit date: 02/07/1992    Years since quitting: 32.0   Smokeless tobacco: Never  Vaping Use   Vaping status: Never Used  Substance and Sexual Activity   Alcohol use: No   Drug use: No   Sexual activity: Not on file

## 2024-02-08 ENCOUNTER — Encounter: Payer: Self-pay | Admitting: Orthopedic Surgery

## 2024-02-27 ENCOUNTER — Ambulatory Visit: Admitting: Orthopedic Surgery

## 2024-03-01 ENCOUNTER — Ambulatory Visit: Admitting: Orthopedic Surgery

## 2024-03-01 DIAGNOSIS — I890 Lymphedema, not elsewhere classified: Secondary | ICD-10-CM | POA: Diagnosis not present

## 2024-03-01 DIAGNOSIS — L97421 Non-pressure chronic ulcer of left heel and midfoot limited to breakdown of skin: Secondary | ICD-10-CM

## 2024-03-01 DIAGNOSIS — I872 Venous insufficiency (chronic) (peripheral): Secondary | ICD-10-CM | POA: Diagnosis not present

## 2024-03-02 ENCOUNTER — Encounter: Payer: Self-pay | Admitting: Orthopedic Surgery

## 2024-03-02 NOTE — Progress Notes (Signed)
 Office Visit Note   Patient: Isabel Barnes           Date of Birth: 26-Dec-1940           MRN: [id number] Visit Date: 03/01/2024              Requested by: Jamal Collin, PA-C [address] [place],  [place] [address] PCP: Jamal Collin, PA-C  Chief Complaint  Patient presents with   Right Leg - Wound Check   Left Leg - Wound Check      HPI: Patient is a 83 year old woman who presents in follow-up for bilateral lower extremity venous ulcers as well as a left heel ulcer.  Assessment & Plan: Visit Diagnoses:  1. 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 Venous insufficiency (chronic) (peripheral)   3. Lymphedema     Plan: Patient was provided a bottle of Vashe for dressing changes.  She will continue with the felt relieving donuts to unload pressure.  Patient will change the venous stasis ulcer dressing daily with Vashe.  Follow-Up Instructions: Return in about 4 weeks (around 03/29/2024).   Ortho Exam  Patient is alert, oriented, no adenopathy, well-dressed, normal affect, normal respiratory effort. Examination patient has had green drainage from the venous ulceration.  Ulcer measures 11 x 5 cm.  Patient states that the green drainage has resolved with using Vashe.  The decubitus pressure ulcers are healing with the felt relieving donuts.  Imaging: No results found.   Labs: Lab Results  Component Value Date   ESRSEDRATE 6 01/16/2024   ESRSEDRATE 6 12/19/2023   ESRSEDRATE 40 (H) 02/07/2022   CRP <3.0 01/16/2024   CRP 7.6 12/19/2023   CRP 20.5 (H) 02/07/2022   REPTSTATUS 09/12/2023 FINAL 09/07/2023   GRAMSTAIN  02/10/2022    RARE WBC PRESENT, PREDOMINANTLY PMN NO ORGANISMS SEEN    CULT  09/07/2023    NO GROWTH 5 DAYS Performed at [facility], [address], [place], [place] [address]    [facility] ENTEROCOCCUS FAECALIS 02/10/2022     Lab Results  Component Value Date   ALBUMIN  3.5 09/07/2023   ALBUMIN 1.5 (L) 02/09/2022   ALBUMIN 2.1 (L) 02/06/2022    Lab Results  Component Value Date   MG 2.0 02/12/2022   MG 1.8 02/07/2022   Lab Results  Component Value Date   VD25OH 69.88 02/07/2022    No results found for: "PREALBUMIN"    Latest Ref Rng & Units 01/16/2024    1:15 PM 12/28/2023   10:31 AM 12/19/2023   11:38 AM  CBC EXTENDED  WBC 3.8 - 10.8 Thousand/uL 7.9  5.0  5.8   RBC 3.80 - 5.10 Million/uL 4.08  3.81  4.03   Hemoglobin 11.7 - 15.5 g/dL 30.8  65.7  84.6   HCT 35.0 - 45.0 % 35.1  32.8  35.2   Platelets 140 - 400 Thousand/uL 371  255  279   NEUT# 1,500 - 7,800 cells/uL 4,013  2,610  2,622      There is no height or weight on file to calculate BMI.  Orders:  No orders of the defined types were placed in this encounter.  No orders of the defined types were placed in this encounter.    Procedures: No procedures performed  Clinical Data: No additional findings.  ROS:  All other systems negative, except as noted in the HPI. Review of Systems  Objective:  Vital Signs: There were no vitals taken for this visit.  Specialty Comments:  No specialty comments available.  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   RLE Wound  R Heel Ulcer 09/08/2023   LLE Cellulitis 09/07/2023   Abscess of right lower leg    Severe protein-calorie malnutrition (HCC)    Peripheral arterial disease (HCC)    Fall    Atrial fibrillation (HCC)    Goals of care, counseling/discussion    Anemia    Fluid collection (edema) in the arms, legs, hands and feet    Cellulitis of right lower extremity 02/06/2022   Chest pain, rule out acute myocardial infarction 03/16/2021   Hypertension 03/16/2021   Hypothyroidism 03/16/2021   Hyperlipemia 03/16/2021   Aortic valve  stenosis 03/16/2021   Chronic venous insufficiency of lower extremity 03/16/2021   Short-term memory loss 12/13/2019   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   Primary osteoarthritis of right knee 07/16/2016   Eosinophilia 06/16/2015   Chronic pain disorder    Osteoarthritis    Incisional umbilical hernia, without obstruction or gangrene    Iron deficiency anemia 09/19/2012   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   Depression    GERD (gastroesophageal reflux disease)    takes Omeprazole daily   Heart murmur    mild MS, moderate-severe AS 03/17/21 echo   History of bronchitis    20+ yrs ago   History of kidney stones    3 surgerical removed, 1 passed   History of prolapse of bladder    History of shingles    Hypertension    takes Losartan daily   Hypothyroidism    takes Synthroid daily   Joint swelling    Neck pain    bone spurs at base of head per pt   Osteoarthritis    lumbar,cervical,joints   Pneumonia    hx of > 20 yrs ago   Rash 01/15/2024   Rheumatoid arthritis (HCC) 12/19/2023   Shortness of breath    occasionally and with exertion. 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HCC)    Unsteady gait    occasionally   Urinary urgency     Family History  Problem Relation Age of Onset   Stroke Father     Past Surgical History:  Procedure Laterality Date   ABDOMINAL AORTOGRAM W/LOWER EXTREMITY N/A 02/15/2022   Procedure: ABDOMINAL AORTOGRAM W/LOWER EXTREMITY;  Surgeon: Maeola Harman, MD;  Location: [facility] INVASIVE CV LAB;  Service: Cardiovascular;  Laterality: N/A;   ABDOMINAL AORTOGRAM W/LOWER EXTREMITY N/A 09/12/2023   Procedure: ABDOMINAL AORTOGRAM W/LOWER EXTREMITY;  Surgeon: Victorino Sparrow, MD;  Location: [facility] INVASIVE CV LAB;  Service: Cardiovascular;  Laterality: N/A;   ABDOMINAL HYSTERECTOMY  ANTERIOR FUSION CERVICAL SPINE     x2 -C4-7   APPLICATION OF WOUND VAC Right 03/12/2022   Procedure: APPLICATION OF WOUND VAC;  Surgeon: Nadara Mustard, MD;  Location: [facility] OR;  Service: Orthopedics;  Laterality: Right;   BUNIONECTOMY Bilateral    COLONOSCOPY     CYSTOSCOPY W/ URETEROSCOPY  2012   EYE SURGERY Bilateral    cataract /lens implant   HOLMIUM LASER APPLICATION Left 02/08/2013   Procedure: HOLMIUM LASER APPLICATION;  Surgeon: Anner Crete, MD;  Location: [facility];  Service: Urology;  Laterality: Left;   I & D EXTREMITY Right 02/10/2022   Procedure: DEBRIDEMENT RIGHT LEG ABSCESS;  Surgeon: Nadara Mustard, MD;  Location: [facility] OR;  Service: Orthopedics;  Laterality: Right;   I & D EXTREMITY Right 03/12/2022   Procedure: RIGHT LEG IRRIGATION AND DEBRIDEMENT EXTREMITY;  Surgeon: Nadara Mustard, MD;  Location: [facility] OR;  Service: Orthopedics;  Laterality: Right;   INSERTION OF MESH N/A 07/15/2014   Procedure: INSERTION OF MESH;  Surgeon: Ardeth Sportsman, MD;  Location: [facility] OR;  Service: General;  Laterality: N/A;   JOINT REPLACEMENT Right 2012   shoulder   LAPAROSCOPIC CHOLECYSTECTOMY W/ CHOLANGIOGRAPHY  2012   Dr Magnus Ivan   NASAL SINUS SURGERY     OPEN REDUCTION INTERNAL FIXATION (ORIF) DISTAL RADIAL FRACTURE Left 03/20/2021   Procedure: OPEN REDUCTION INTERNAL FIXATION (ORIF) DISTAL RADIAL FRACTURE;  Surgeon: Teryl Lucy, MD;  Location: [facility] OR;  Service: Orthopedics;  Laterality: Left;   RADIOLOGY WITH ANESTHESIA N/A 05/09/2014   Procedure: ADULT SEDATION WITH ANESTHESIA/MRI CERVICAL SPINE WITHOUT CONTRAST;  Surgeon: Medication Radiologist, MD;   Location: [facility] OR;  Service: Radiology;  Laterality: N/A;  DR. HAWKS/MRI   right knee arthroscopy     d/t meniscal tear   SHOULDER ARTHROSCOPY W/ ROTATOR CUFF REPAIR Bilateral three times each over several yrs   SKIN FULL THICKNESS GRAFT Right 03/12/2022   Procedure: SKIN GRAFT FULL THICKNESS;  Surgeon: Nadara Mustard, MD;  Location: [facility] OR;  Service: Orthopedics;  Laterality: Right;   SKIN SPLIT GRAFT Right 02/17/2022   Procedure: RIGHT LEG SKIN GRAFT;  Surgeon: Nadara Mustard, MD;  Location: [facility] OR;  Service: Orthopedics;  Laterality: Right;   THUMB ARTHROSCOPY Left    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   Procedure: RIGHT TOTAL KNEE ARTHROPLASTY;  Surgeon: Jodi Geralds, MD;  Location: [facility] OR;  Service: Orthopedics;  Laterality: Right;   UMBILICAL HERNIA REPAIR N/A 07/15/2014   Procedure: LAPAROSCOPIC UMBILICAL AND INFRAUMBILICAL HERNIA;  Surgeon: Ardeth Sportsman, MD;  Location: [facility] OR;  Service: General;  Laterality: N/A;   Social History   Occupational History   Not on file  Tobacco Use   Smoking status: Former    Current packs/day: 0.00    Average packs/day: 0.5 packs/day for 2.0 years (1.0 ttl pk-yrs)    Types: Cigarettes    Start date: 02/06/1990    Quit date: 02/07/1992    Years since quitting: 32.0   Smokeless tobacco: Never  Vaping Use   Vaping status: Never Used  Substance and Sexual Activity   Alcohol use: No   Drug use: No   Sexual activity: Not on file

## 2024-03-19 ENCOUNTER — Encounter: Payer: Self-pay | Admitting: Infectious Disease

## 2024-03-19 ENCOUNTER — Ambulatory Visit: Payer: Medicare Other | Admitting: Infectious Disease

## 2024-03-29 ENCOUNTER — Encounter: Payer: Self-pay | Admitting: Orthopedic Surgery

## 2024-04-02 ENCOUNTER — Ambulatory Visit: Admitting: Orthopedic Surgery

## 2024-04-02 DIAGNOSIS — L97914 Non-pressure chronic ulcer of unspecified part of right lower leg with necrosis of bone: Secondary | ICD-10-CM | POA: Diagnosis not present

## 2024-04-02 DIAGNOSIS — L97421 Non-pressure chronic ulcer of left heel and midfoot limited to breakdown of skin: Secondary | ICD-10-CM | POA: Diagnosis not present

## 2024-04-02 DIAGNOSIS — I890 Lymphedema, not elsewhere classified: Secondary | ICD-10-CM | POA: Diagnosis not present

## 2024-04-02 DIAGNOSIS — I872 Venous insufficiency (chronic) (peripheral): Secondary | ICD-10-CM

## 2024-04-02 MED ORDER — FLUCONAZOLE 150 MG PO TABS: 150.0000 mg | ORAL_TABLET | ORAL | 0 refills | Status: DC

## 2024-04-04 ENCOUNTER — Other Ambulatory Visit: Payer: Self-pay | Admitting: Orthopedic Surgery

## 2024-04-06 ENCOUNTER — Ambulatory Visit: Admitting: Infectious Diseases

## 2024-04-06 ENCOUNTER — Other Ambulatory Visit: Payer: Self-pay

## 2024-04-06 VITALS — BP 132/45 | HR 74 | Temp 98.4°F

## 2024-04-06 DIAGNOSIS — L4050 Arthropathic psoriasis, unspecified: Secondary | ICD-10-CM

## 2024-04-06 DIAGNOSIS — I739 Peripheral vascular disease, unspecified: Secondary | ICD-10-CM

## 2024-04-06 DIAGNOSIS — L03116 Cellulitis of left lower limb: Secondary | ICD-10-CM

## 2024-04-06 DIAGNOSIS — I83019 Varicose veins of right lower extremity with ulcer of unspecified site: Secondary | ICD-10-CM | POA: Diagnosis not present

## 2024-04-06 DIAGNOSIS — Z79899 Other long term (current) drug therapy: Secondary | ICD-10-CM | POA: Diagnosis not present

## 2024-04-06 DIAGNOSIS — M069 Rheumatoid arthritis, unspecified: Secondary | ICD-10-CM

## 2024-04-06 DIAGNOSIS — I872 Venous insufficiency (chronic) (peripheral): Secondary | ICD-10-CM

## 2024-04-06 DIAGNOSIS — L97919 Non-pressure chronic ulcer of unspecified part of right lower leg with unspecified severity: Secondary | ICD-10-CM

## 2024-04-06 DIAGNOSIS — B49 Unspecified mycosis: Secondary | ICD-10-CM

## 2024-04-06 NOTE — Progress Notes (Signed)
 Subjective:   Chief complaint: follow-up venous ulcer of lower extremity   [name], female    DOB: Mar 13, 1941, 83 y.o.   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 The infections were initially considered for amputation, but the patient declined due to concerns about anesthesia and learning to use a prosthesis. The patient was treated with IV antibiotics, which were later switched to oral antibiotics (Augmentin ). 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 The patient reports ongoing discomfort in the affected areas, which is managed with padding. The patient also has vascular disease, which likely contributed to the development of the infections. The patient's caregiver is actively involved in the patient's care, assisting with wound dressing changes and medication management. She also has RA and psoriatic arthritis that cause neck and hand pain but only on symptomatic management.   Discussed the use of AI scribe software for clinical note transcription with the patient, who gave verbal consent to proceed.  5/9  Fu for wound of rt leg due to concerns for being more red, swollen, tenderness and peeling of skin. She was seen by Dr Julio Ohm 5/5 with concerns for cellulitis as well as fungal infection. She was prerscribd Fluconazole  weekly for 4 weeks. She is completing 2 weeks of PO augmentin  today. Reports improvement in swelling as well as peeling. Recently, there was blood-like drainage from the rt heel after shaving the area, and a small blood blister on the other left foot that has resolved. The heels remain tender and sore, especially when touched or washed.  She feels poorly on antibiotics, attributing this to her size and difficulty swallowing. Denis  fever, chills, nausea, vomiting, or diarrhea. No other concerns.       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   Depression    GERD (gastroesophageal reflux disease)    takes Omeprazole daily   Heart murmur    mild MS, moderate-severe AS 03/17/21 echo   History of bronchitis    20+ yrs ago   History of kidney stones    3 surgerical removed, 1 passed   History of prolapse of bladder    History of shingles    Hypertension    takes Losartan  daily   Hypothyroidism    takes Synthroid  daily   Joint swelling    Neck pain    bone spurs at base of head per pt   Osteoarthritis    lumbar,cervical,joints   Pneumonia    hx of > 20 yrs ago   Rash 01/15/2024   Rheumatoid arthritis (HCC) 12/19/2023   Shortness of breath    occasionally and with exertion. Albuterol  inhaler as needed   Spinal headache 1991   blood patch placed   Spondylitis (HCC)  Unsteady gait    occasionally   Urinary urgency     Past Surgical History:  Procedure Laterality Date   ABDOMINAL AORTOGRAM W/LOWER EXTREMITY N/A 02/15/2022   Procedure: ABDOMINAL AORTOGRAM W/LOWER EXTREMITY;  Surgeon: Adine Hoof, MD;  Location: [facility] INVASIVE CV LAB;  Service: Cardiovascular;  Laterality: N/A;   ABDOMINAL AORTOGRAM W/LOWER EXTREMITY N/A 09/12/2023   Procedure: ABDOMINAL AORTOGRAM W/LOWER EXTREMITY;  Surgeon: Kayla Part, MD;  Location: [facility] INVASIVE CV LAB;  Service: Cardiovascular;  Laterality: N/A;   ABDOMINAL HYSTERECTOMY     ANTERIOR FUSION CERVICAL SPINE     x2  -C4-7   APPLICATION OF WOUND VAC Right 03/12/2022   Procedure: APPLICATION OF WOUND VAC;  Surgeon: Timothy Ford, MD;  Location: [facility] OR;  Service: Orthopedics;  Laterality: Right;   BUNIONECTOMY Bilateral    COLONOSCOPY     CYSTOSCOPY W/ URETEROSCOPY  2012   EYE SURGERY Bilateral    cataract /lens implant   HOLMIUM LASER APPLICATION Left 02/08/2013   Procedure: HOLMIUM LASER APPLICATION;  Surgeon: Willye Harvey, MD;  Location: [facility];  Service: Urology;  Laterality: Left;   I & D EXTREMITY Right 02/10/2022   Procedure: DEBRIDEMENT RIGHT LEG ABSCESS;  Surgeon: Timothy Ford, MD;  Location: [facility] OR;  Service: Orthopedics;  Laterality: Right;   I & D EXTREMITY Right 03/12/2022   Procedure: RIGHT LEG IRRIGATION AND DEBRIDEMENT EXTREMITY;  Surgeon: Timothy Ford, MD;  Location: [facility] OR;  Service: Orthopedics;  Laterality: Right;   INSERTION OF MESH N/A 07/15/2014   Procedure: INSERTION OF MESH;  Surgeon: Eddye Goodie, MD;  Location: [facility] OR;  Service: General;  Laterality: N/A;   JOINT REPLACEMENT Right 2012   shoulder   LAPAROSCOPIC CHOLECYSTECTOMY W/ CHOLANGIOGRAPHY  2012   Dr Lucienne Ryder   NASAL SINUS SURGERY     OPEN REDUCTION INTERNAL FIXATION (ORIF) DISTAL RADIAL FRACTURE Left 03/20/2021   Procedure: OPEN REDUCTION INTERNAL FIXATION (ORIF) DISTAL RADIAL FRACTURE;  Surgeon: Osa Blase, MD;  Location: [facility] OR;  Service: Orthopedics;  Laterality: Left;   RADIOLOGY WITH ANESTHESIA N/A 05/09/2014   Procedure: ADULT SEDATION WITH ANESTHESIA/MRI CERVICAL SPINE WITHOUT CONTRAST;  Surgeon: Medication Radiologist, MD;  Location: [facility] OR;  Service: Radiology;  Laterality: N/A;  DR. HAWKS/MRI   right knee arthroscopy     d/t meniscal tear   SHOULDER ARTHROSCOPY W/ ROTATOR CUFF REPAIR Bilateral three times each over several yrs   SKIN FULL THICKNESS GRAFT Right 03/12/2022   Procedure: SKIN GRAFT FULL THICKNESS;  Surgeon: Timothy Ford, MD;  Location: [facility] OR;  Service: Orthopedics;   Laterality: Right;   SKIN SPLIT GRAFT Right 02/17/2022   Procedure: RIGHT LEG SKIN GRAFT;  Surgeon: Timothy Ford, MD;  Location: [facility] OR;  Service: Orthopedics;  Laterality: Right;   THUMB ARTHROSCOPY Left    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   Procedure: RIGHT TOTAL KNEE ARTHROPLASTY;  Surgeon: Neil Balls, MD;  Location: [facility] OR;  Service: Orthopedics;  Laterality: Right;   UMBILICAL HERNIA REPAIR N/A 07/15/2014   Procedure: LAPAROSCOPIC UMBILICAL AND INFRAUMBILICAL HERNIA;  Surgeon: Eddye Goodie, MD;  Location: [facility] OR;  Service: General;  Laterality: N/A;    Family History  Problem Relation Age of Onset   Stroke Father       Social History   Socioeconomic History   Marital status: Divorced    Spouse name: Not on file   Number of children: Not on file   Years of education: Not  on file   Highest education level: Not on file  Occupational History   Not on file  Tobacco Use   Smoking status: Former    Current packs/day: 0.00    Average packs/day: 0.5 packs/day for 2.0 years (1.0 ttl pk-yrs)    Types: Cigarettes    Start date: 02/06/1990    Quit date: 02/07/1992    Years since quitting: 32.2   Smokeless tobacco: Never  Vaping Use   Vaping status: Never Used  Substance and Sexual Activity   Alcohol  use: No   Drug use: No   Sexual activity: Not on file  Other Topics Concern   Not on file  Social History Narrative   Not on file   Social Drivers of Health   Financial Resource Strain: Low Risk  (05/02/2023)   Received from [facility]   Overall Financial Resource Strain (CARDIA)    Difficulty of Paying Living Expenses: Not very hard  Food Insecurity: Low Risk  (04/06/2024)   Received from [facility]   Hunger Vital Sign    Worried About Running Out of Food in the Last Year: Never true    Ran Out of Food in the Last Year: Never true  Transportation Needs: No Transportation Needs (04/06/2024)   Received from [facility]    In the past 12 months,  has lack of reliable transportation kept you from medical appointments, meetings, work or from getting things needed for daily living? : No  Physical Activity: Unknown (05/02/2023)   Received from [facility]   Exercise Vital Sign    Days of Exercise per Week: 0 days    Minutes of Exercise per Session: Not on file  Stress: No Stress Concern Present (05/02/2023)   Received from [facility] - Occupational Stress Questionnaire    Feeling of Stress : Only a little  Social Connections: Moderately Integrated (05/02/2023)   Received from [facility]   Social Network    How would you rate your social network (family, work, friends)?: Adequate participation with social networks    Allergies  Allergen Reactions   Dilaudid  [Hydromorphone  Hcl] Shortness Of Breath   Gabapentin Other (See Comments)    Hoarseness , headache and sore throat   Latex Rash    Severe rash   Lyrica [Pregabalin] Other (See Comments)    No balance , had to walk with cane , Blurred vision,weakness.   Oxycodone  Shortness Of Breath and Other (See Comments)    Tolerated oxycodone  during admission to hospital 02/06/22   Singulair [Montelukast] Shortness Of Breath and Other (See Comments)    Vision issues, also   Ciprofloxacin Hcl Nausea And Vomiting and Other (See Comments)    Nausea and vomiting with by mouth form   Codeine Other (See Comments)    Hallucinations   Methadone Nausea And Vomiting and Other (See Comments)    Severe nausea and vomiting   Metronidazole  Nausea And Vomiting and Other (See Comments)    Gastric pain   Oysters [Shellfish Allergy] Other (See Comments)    "Terrible gastric upset and cramping."   Clindamycin /Lincomycin Diarrhea and Nausea Only   Donepezil Diarrhea and Other (See Comments)    Severe diarrhea   Povidone-Iodine  Other (See Comments)   Sulfa Antibiotics Diarrhea and Other (See Comments)    GI issues    Tape Other (See Comments)    ADHESIVE  TAPE-Severe rash   Zetia [Ezetimibe] Diarrhea   Elemental Sulfur Nausea  And Vomiting   Iodine  Rash   Other Rash    All Antibiotic ointments/ creams   Oyster Shell Rash   Povidone Iodine  Rash and Other (See Comments)    Oyster shell products- Rash    Skintegrity Hydrogel [Skin Protectants, Misc.] Rash   Tapentadol Other (See Comments)    Nightmares **Nucynta**     Current Outpatient Medications:    acetaminophen  (TYLENOL ) 500 MG tablet, Take 2 tablets (1,000 mg total) by mouth 3 (three) times daily., Disp: 180 tablet, Rfl: 0   amoxicillin -clavulanate (AUGMENTIN ) 875-125 MG tablet, Take 1 tablet by mouth 2 (two) times daily., Disp: 60 tablet, Rfl: 5   aspirin  EC 81 MG tablet, Take 1 tablet (81 mg total) by mouth daily. Swallow whole., Disp: 30 tablet, Rfl: 12   cholecalciferol  (VITAMIN D ) 1000 UNITS tablet, Take 1,000 Units by mouth daily., Disp: , Rfl:    diclofenac  sodium (VOLTAREN ) 1 % GEL, Apply 2 g topically daily as needed (for pain)., Disp: , Rfl:    Digestive Enzymes (ENZYME DIGEST) CAPS, Take 1 capsule by mouth daily as needed (For digestive)., Disp: 30 capsule, Rfl: 0   ENSURE PLUS (ENSURE PLUS) LIQD, Take 237 mLs by mouth daily., Disp: , Rfl:    ferrous sulfate  325 (65 FE) MG tablet, Take 1 tablet (325 mg total) by mouth daily with breakfast., Disp: 30 tablet, Rfl: 0   fish oil-omega-3 fatty acids 1000 MG capsule, Take 1,000 mg by mouth 2 (two) times daily. , Disp: , Rfl:    fluconazole  (DIFLUCAN ) 150 MG tablet, Take 1 tablet (150 mg total) by mouth once a week., Disp: 4 tablet, Rfl: 0   HYDROcodone -acetaminophen  (NORCO) 10-325 MG tablet, Take 1 tablet by mouth in the morning, at noon, and at bedtime., Disp: , Rfl:    Lactobacillus (PROBIOTIC ACIDOPHILUS PO), Take 1 capsule by mouth daily., Disp: , Rfl:    levothyroxine  (SYNTHROID ) 100 MCG tablet, Take 1 tablet (100 mcg total) by mouth every morning., Disp: 30 tablet, Rfl: 0   loperamide (IMODIUM A-D) 2 MG tablet, Take 4 mg by  mouth 3 (three) times daily as needed for diarrhea or loose stools., Disp: , Rfl:    loratadine  (CLARITIN ) 10 MG tablet, Take 1 tablet (10 mg total) by mouth daily., Disp: 30 tablet, Rfl: 0   losartan  (COZAAR ) 25 MG tablet, Take 1 tablet (25 mg total) by mouth daily. (Patient taking differently: Take 100 mg by mouth daily.), Disp: 30 tablet, Rfl: 0   methocarbamol  (ROBAXIN ) 500 MG tablet, Take 500 mg by mouth every 6 (six) hours as needed for muscle spasms., Disp: , Rfl:    Multiple Vitamins-Minerals (MULTIVITAMIN PO), Take 1 tablet by mouth daily., Disp: , Rfl:    ondansetron  (ZOFRAN ) 4 MG tablet, Take 1 tablet (4 mg total) by mouth 2 (two) times daily. Take before each dose of augmentin  and can take again if needed up to 3 doses per day, Disp: 90 tablet, Rfl: 3   polyethylene glycol (MIRALAX  / GLYCOLAX ) 17 g packet, Take 17 g by mouth daily., Disp: 14 each, Rfl: 2   predniSONE  (DELTASONE ) 10 MG tablet, TAKE 1 TABLET (10 MG TOTAL) BY MOUTH DAILY WITH BREAKFAST., Disp: 30 tablet, Rfl: 0   RESTASIS  0.05 % ophthalmic emulsion, Place 1 drop into both eyes daily., Disp: 0.4 mL, Rfl: 0   silver  sulfADIAZINE  (SILVADENE ) 1 % cream, Apply 1 Application topically 2 (two) times daily., Disp: , Rfl:    tetrahydrozoline 0.05 % ophthalmic solution, Place 1  drop into both eyes 4 (four) times daily., Disp: , Rfl:    triamcinolone  (NASACORT ) 55 MCG/ACT AERO nasal inhaler, Place 1-2 sprays into the nose 2 (two) times daily as needed (for seasonal allergies)., Disp: 1 each, Rfl: 3   Turmeric 500 MG CAPS, Take 1 capsule by mouth at bedtime., Disp: , Rfl:    Vitamin D -Vitamin K (K2 PLUS D3) [phone] MCG-UNIT TABS, Take 1 tablet by mouth daily., Disp: , Rfl:    Review of Systems  Constitutional:  Negative for activity change, appetite change, chills, diaphoresis, fatigue, fever and unexpected weight change.  HENT:  Negative for congestion, rhinorrhea, sinus pressure, sneezing, sore throat and trouble swallowing.    Eyes:  Negative for photophobia and visual disturbance.  Respiratory:  Negative for cough, chest tightness, shortness of breath, wheezing and stridor.   Cardiovascular:  Negative for chest pain, palpitations and leg swelling.  Gastrointestinal:  Negative for abdominal distention, abdominal pain, anal bleeding, blood in stool, constipation, diarrhea, nausea and vomiting.  Genitourinary:  Negative for difficulty urinating, dysuria, flank pain and hematuria.  Musculoskeletal:  Positive for arthralgias and myalgias. Negative for back pain, gait problem and joint swelling.  Skin:  Negative for color change, pallor, rash and wound.  Neurological:  Negative for dizziness, tremors, weakness and light-headedness.  Hematological:  Negative for adenopathy. Does not bruise/bleed easily.  Psychiatric/Behavioral:  Negative for agitation, behavioral problems, confusion, decreased concentration, dysphoric mood and sleep disturbance.        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 Professional time spent includes the following activities: Preparing to see the patient (review of tests), Obtaining and reviewing separately obtained history (office notes from Dr Ernie Heal and Dr Julio Ohm), Performing a medically appropriate examination and evaluation , Ordering labs, Documenting clinical information in the EMR, Independently interpreting results (not separately reported), Communicating results to the patient, Counseling and educating the patient and Care coordination (not separately reported).   Terre Ferri, MD [facility]

## 2024-04-09 ENCOUNTER — Encounter: Payer: Self-pay | Admitting: Orthopedic Surgery

## 2024-04-09 LAB — COMPREHENSIVE METABOLIC PANEL WITH GFR
AG Ratio: 1.8 (calc) (ref 1.0–2.5)
ALT: 43 U/L — ABNORMAL HIGH (ref 6–29)
AST: 24 U/L (ref 10–35)
Albumin: 3.5 g/dL — ABNORMAL LOW (ref 3.6–5.1)
Alkaline phosphatase (APISO): 134 U/L (ref 37–153)
BUN: 15 mg/dL (ref 7–25)
CO2: 26 mmol/L (ref 20–32)
Calcium: 8.4 mg/dL — ABNORMAL LOW (ref 8.6–10.4)
Chloride: 106 mmol/L (ref 98–110)
Creat: 0.7 mg/dL (ref 0.60–0.95)
Globulin: 2 g/dL (ref 1.9–3.7)
Glucose, Bld: 122 mg/dL — ABNORMAL HIGH (ref 65–99)
Potassium: 3.8 mmol/L (ref 3.5–5.3)
Sodium: 139 mmol/L (ref 135–146)
Total Bilirubin: 0.5 mg/dL (ref 0.2–1.2)
Total Protein: 5.5 g/dL — ABNORMAL LOW (ref 6.1–8.1)
eGFR: 85 mL/min/{1.73_m2} (ref >=60)

## 2024-04-09 LAB — CBC
HCT: 30.7 % — ABNORMAL LOW (ref 35.0–45.0)
Hemoglobin: 10 g/dL — ABNORMAL LOW (ref 11.7–15.5)
MCH: 28.4 pg (ref 27.0–33.0)
MCHC: 32.6 g/dL (ref 32.0–36.0)
MCV: 87.2 fL (ref 80.0–100.0)
MPV: 10.4 fL (ref 7.5–12.5)
Platelets: 277 10*3/uL (ref 140–400)
RBC: 3.52 10*6/uL — ABNORMAL LOW (ref 3.80–5.10)
RDW: 13 % (ref 11.0–15.0)
WBC: 6.5 10*3/uL (ref 3.8–10.8)

## 2024-04-09 LAB — C-REACTIVE PROTEIN: CRP: 42.4 mg/L — ABNORMAL HIGH (ref <8.0)

## 2024-04-09 NOTE — Progress Notes (Signed)
 Office Visit Note   Patient: Isabel Barnes           Date of Birth: [birth year]           MRN: [id number] Visit Date: 04/02/2024              Requested by: Majorie Scrape, PA-C [address] [place],  [place] [address] PCP: Majorie Scrape, [place]  Chief Complaint  Patient presents with   Right Leg - Follow-up   Left Leg - Follow-up      HPI: Patient is an 83 year old woman who presents in follow-up for foot and leg ulceration with history of venous and lymphatic insufficiency.  Patient states she has restarted her Augmentin .  Patient states that her legs are red and swollen.  She does have a follow-up with infectious disease on Friday.  Assessment & Plan: Visit Diagnoses:  1. 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 Venous insufficiency (chronic) (peripheral)   3. Lymphedema   4. 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  Follow-Up Instructions: Return in about 4 weeks (around 04/30/2024).   Ortho Exam  Patient is alert, oriented, no adenopathy, well-dressed, normal affect, normal respiratory effort. Examination patient has cellulitis of both lower extremities the left heel ulcer is healed there is no cellulitis or drainage around the left heel.  Patient has cellulitis of the left foot first webspace currently using Vashe on the right leg ulcer that measures 11 x 6 cm.  Patient has fungal changes in both lower extremities.  Imaging: No results found. No images are attached to the encounter.  Labs: Lab Results  Component Value Date   ESRSEDRATE 6 01/16/2024   ESRSEDRATE 6 12/19/2023   ESRSEDRATE 40 (H) 02/07/2022   CRP <3.0 01/16/2024   CRP 7.6 12/19/2023   CRP 20.5 (H) 02/07/2022   REPTSTATUS 09/12/2023 FINAL 09/07/2023   GRAMSTAIN  02/10/2022    RARE WBC PRESENT, PREDOMINANTLY PMN NO  ORGANISMS SEEN    CULT  09/07/2023    NO GROWTH 5 DAYS Performed at [facility], [address] [place], [place] [address]    [facility] ENTEROCOCCUS FAECALIS 02/10/2022     Lab Results  Component Value Date   ALBUMIN  3.5 09/07/2023   ALBUMIN  1.5 (L) 02/09/2022   ALBUMIN  2.1 (L) 02/06/2022    Lab Results  Component Value Date   MG 2.0 02/12/2022   MG 1.8 02/07/2022   Lab Results  Component Value Date   VD25OH 69.88 02/07/2022    No results found for: "PREALBUMIN"    Latest Ref Rng & Units 04/06/2024   11:30 AM 01/16/2024    1:15 PM 12/28/2023   10:31 AM  CBC EXTENDED  WBC 3.8 - 10.8 Thousand/uL 6.5  7.9  5.0   RBC 3.80 - 5.10 Million/uL 3.52  4.08  3.81   Hemoglobin 11.7 - 15.5 g/dL 55.7  32.2  02.5   HCT 35.0 - 45.0 % 30.7  35.1  32.8   Platelets 140 - 400 Thousand/uL 277  371  255   NEUT# 1,500 - 7,800 cells/uL  4,013  2,610      There is no height or weight on file to calculate BMI.  Orders:  No orders of the defined types were placed in this encounter.  Meds ordered  this encounter  Medications   fluconazole  (DIFLUCAN ) 150 MG tablet    Sig: Take 1 tablet (150 mg total) by mouth once a week.    Dispense:  4 tablet    Refill:  0     Procedures: No procedures performed  Clinical Data: No additional findings.  ROS:  All other systems negative, except as noted in the HPI. Review of Systems  Objective: Vital Signs: There were no vitals taken for this visit.  Specialty Comments:  No specialty comments available.  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   RLE Wound  R Heel Ulcer 09/08/2023   LLE Cellulitis 09/07/2023   Abscess of right lower leg    Severe protein-calorie malnutrition (HCC)    Peripheral arterial disease (HCC)    Fall    Atrial  fibrillation (HCC)    Goals of care, counseling/discussion    Anemia    Fluid collection (edema) in the arms, legs, hands and feet    Cellulitis of right lower extremity 02/06/2022   Chest pain, rule out acute myocardial infarction 03/16/2021   Hypertension 03/16/2021   Hypothyroidism 03/16/2021   Hyperlipemia 03/16/2021   Aortic valve stenosis 03/16/2021   Chronic venous insufficiency of lower extremity 03/16/2021   Short-term memory loss 12/13/2019   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   Primary osteoarthritis of right knee 07/16/2016   Eosinophilia 06/16/2015   Chronic pain disorder    Osteoarthritis    Incisional umbilical hernia, without obstruction or gangrene    Iron deficiency anemia 09/19/2012   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   Depression    GERD (gastroesophageal reflux disease)    takes Omeprazole daily   Heart murmur    mild MS, moderate-severe AS 03/17/21 echo   History of bronchitis    20+ yrs ago   History of kidney stones    3 surgerical removed, 1 passed   History of prolapse of bladder    History of shingles    Hypertension    takes Losartan  daily   Hypothyroidism    takes Synthroid  daily   Joint swelling    Neck pain    bone spurs at base of head per pt   Osteoarthritis    lumbar,cervical,joints   Pneumonia    hx of > 20 yrs ago   Rash 01/15/2024   Rheumatoid arthritis (HCC) 12/19/2023   Shortness of breath    occasionally and with exertion. Albuterol  inhaler as needed   Spinal headache 1991   blood  patch placed   Spondylitis (HCC)    Unsteady gait    occasionally   Urinary urgency     Family History  Problem Relation Age of Onset   Stroke Father     Past Surgical History:  Procedure Laterality Date   ABDOMINAL  AORTOGRAM W/LOWER EXTREMITY N/A 02/15/2022   Procedure: ABDOMINAL AORTOGRAM W/LOWER EXTREMITY;  Surgeon: Adine Hoof, MD;  Location: [facility] INVASIVE CV LAB;  Service: Cardiovascular;  Laterality: N/A;   ABDOMINAL AORTOGRAM W/LOWER EXTREMITY N/A 09/12/2023   Procedure: ABDOMINAL AORTOGRAM W/LOWER EXTREMITY;  Surgeon: Kayla Part, MD;  Location: [facility]VASIVE CV LAB;  Service: Cardiovascular;  Laterality: N/A;   ABDOMINAL HYSTERECTOMY     ANTERIOR FUSION CERVICAL SPINE     x2 -C4-7   APPLICATION OF WOUND VAC Right 03/12/2022   Procedure: APPLICATION OF WOUND VAC;  Surgeon: Timothy Ford, MD;  [facility]: [facility] OR;  Service: Orthopedics;  Laterality: Right;   BUNIONECTOMY Bilateral    COLONOSCOPY     CYSTOSCOPY W/ URETEROSCOPY  2012   EYE SURGERY Bilateral    cataract /lens implant   HOLMIUM LASER APPLICATION Left 02/08/2013   Procedure: HOLMIUM LASER APPLICATION;  Surgeon: Willye Harvey, MD;  [facility];  Service: Urology;  Laterality: Left;   I & D EXTREMITY Right 02/10/2022   Procedure: DEBRIDEMENT RIGHT LEG ABSCESS;  Surgeon: Timothy Ford, MD;  Location: [facility] OR;  Service: Orthopedics;  Laterality: Right;   I & D EXTREMITY Right 03/12/2022   Procedure: RIGHT LEG IRRIGATION AND DEBRIDEMENT EXTREMITY;  Surgeon: Timothy Ford, MD;  Location: [facility] OR;  Service: Orthopedics;  Laterality: Right;   INSERTION OF MESH N/A 07/15/2014   Procedure: INSERTION OF MESH;  Surgeon: Eddye Goodie, MD;  Location[facility] OR;  Service: General;  Laterality: N/A;   JOINT REPLACEMENT Right 2012   shoulder   LAPAROSCOPIC CHOLECYSTECTOMY W/ CHOLANGIOGRAPHY  2012   Dr Lucienne Ryder   NASAL SINUS SURGERY     OPEN REDUCTION INTERNAL FIXATION (ORIF) DISTAL RADIAL FRACTURE  Left 03/20/2021   Procedure: OPEN REDUCTION INTERNAL FIXATION (ORIF) DISTAL RADIAL FRACTURE;  Surgeon: Osa Blase, MD;  Location: [facility] OR;  Service: Orthopedics;  Laterality: Left;   RADIOLOGY WITH ANESTHESIA N/A 05/09/2014   Procedure: ADULT SEDATION WITH ANESTHESIA/MRI CERVICAL SPINE WITHOUT CONTRAST;  Surgeon: Medication Radiologist, MD;  Location: [facility] OR;  Service: Radiology;  Laterality: N/A;  DR. HAWKS/MRI   right knee arthroscopy     d/t meniscal tear   SHOULDER ARTHROSCOPY W/ ROTATOR CUFF REPAIR Bilateral three times each over several yrs   SKIN FULL THICKNESS GRAFT Right 03/12/2022   Procedure: SKIN GRAFT FULL THICKNESS;  Surgeon: Timothy Ford, MD;  Location: [facility] OR;  Service: Orthopedics;  Laterality: Right;   SKIN SPLIT GRAFT Right 02/17/2022   Procedure: RIGHT LEG SKIN GRAFT;  Surgeon: Timothy Ford, MD;  Location: [facility] OR;  Service: Orthopedics;  Laterality: Right;   THUMB ARTHROSCOPY Left    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   Procedure: RIGHT TOTAL KNEE ARTHROPLASTY;  Surgeon: Neil Balls, MD;  [facility] OR;  Service: Orthopedics;  Laterality: Right;   UMBILICAL HERNIA REPAIR N/A 07/15/2014   Procedure: LAPAROSCOPIC UMBILICAL AND INFRAUMBILICAL HERNIA;  Surgeon: Eddye Goodie, MD;  Location[facility] OR;  Service: General;  Laterality: N/A;   Social History   Occupational History   Not on file  Tobacco Use   Smoking status: Former    Current packs/day: 0.00    Average packs/day: 0.5 packs/day for 2.0 years (1.0 ttl pk-yrs)    Types: Cigarettes    Start date: 02/06/1990    Quit date: 02/07/1992    Years since quitting: 32.1   Smokeless tobacco: Never  Vaping Use   Vaping status: Never Used  Substance and Sexual Activity   Alcohol  use: No   Drug use: No   Sexual activity: Not on file

## 2024-04-10 ENCOUNTER — Ambulatory Visit: Payer: Self-pay

## 2024-04-16 DIAGNOSIS — I83019 Varicose veins of right lower extremity with ulcer of unspecified site: Secondary | ICD-10-CM | POA: Insufficient documentation

## 2024-04-16 DIAGNOSIS — Z79899 Other long term (current) drug therapy: Secondary | ICD-10-CM | POA: Insufficient documentation

## 2024-04-16 DIAGNOSIS — B49 Unspecified mycosis: Secondary | ICD-10-CM | POA: Insufficient documentation

## 2024-04-16 DIAGNOSIS — L97919 Non-pressure chronic ulcer of unspecified part of right lower leg with unspecified severity: Secondary | ICD-10-CM | POA: Insufficient documentation

## 2024-04-24 ENCOUNTER — Ambulatory Visit: Payer: Medicare Other | Admitting: Infectious Disease

## 2024-04-25 ENCOUNTER — Ambulatory Visit: Admitting: Infectious Disease

## 2024-04-26 ENCOUNTER — Other Ambulatory Visit: Payer: Self-pay | Admitting: Infectious Disease

## 2024-04-26 NOTE — Telephone Encounter (Signed)
 Ondansetron  was prescribed to premedicate before Augmentin . Augmentin  was stopped at last appointment.   Arash Karstens, BSN, RN

## 2024-04-30 ENCOUNTER — Ambulatory Visit: Admitting: Orthopedic Surgery

## 2024-04-30 DIAGNOSIS — L97511 Non-pressure chronic ulcer of other part of right foot limited to breakdown of skin: Secondary | ICD-10-CM | POA: Diagnosis not present

## 2024-04-30 DIAGNOSIS — I890 Lymphedema, not elsewhere classified: Secondary | ICD-10-CM | POA: Diagnosis not present

## 2024-04-30 DIAGNOSIS — I872 Venous insufficiency (chronic) (peripheral): Secondary | ICD-10-CM | POA: Diagnosis not present

## 2024-05-06 ENCOUNTER — Encounter: Payer: Self-pay | Admitting: Orthopedic Surgery

## 2024-05-06 NOTE — Progress Notes (Signed)
 Office Visit Note   Patient: Isabel Barnes           Date of Birth: [birth year]           MRN: [id number] Visit Date: 04/30/2024              Requested by: Majorie Scrape, PA-C [address] [place],  [place] [address] PCP: Majorie Scrape, PA-C  Chief Complaint  Patient presents with   Right Leg - Wound Check   Left Leg - Follow-up      HPI: Patient is an 83 year old woman who presents with persistent dermatitis of both lower extremities.  Her rash on the feet and legs seemed consistent with fungal and she did take fluconazole  1 times a week for 4 weeks without change.  Currently using Tinactin spray.  Assessment & Plan: Visit Diagnoses:  1. Venous insufficiency (chronic) (peripheral)   2. Lymphedema   3. Ulcer of toe, right, limited to breakdown of skin (HCC)     Plan: Patient's dermatitis most likely secondary to her venous and lymphatic insufficiency.  Recommended using a heel cup for the right heel recommended cleansing of the wound with Vashe.  Follow-Up Instructions: Return in about 4 weeks (around 05/28/2024).   Ortho Exam  Patient is alert, oriented, no adenopathy, well-dressed, normal affect, normal respiratory effort. Examination patient does have dermatitis of both lower extremities.  She has a large venous ulcer on the right lower extremity which is 5 x 12 cm.  There is fibrinous exudative tissue covering the wound.  Patient has an ulcer on the right heel that is healed but is tender to palpation.    Imaging: No results found. No images are attached to the encounter.  Labs: Lab Results  Component Value Date   ESRSEDRATE 6 01/16/2024   ESRSEDRATE 6 12/19/2023   ESRSEDRATE 40 (H) 02/07/2022   CRP 42.4 (H) 04/06/2024   CRP <3.0 01/16/2024   CRP 7.6 12/19/2023   REPTSTATUS 09/12/2023 FINAL 09/07/2023   GRAMSTAIN  02/10/2022    RARE WBC PRESENT, PREDOMINANTLY PMN NO ORGANISMS SEEN    CULT  09/07/2023    NO GROWTH 5  DAYS Performed at [facility], [address] [place], [place] [address]    [facility] ENTEROCOCCUS FAECALIS 02/10/2022     Lab Results  Component Value Date   ALBUMIN  3.5 09/07/2023   ALBUMIN  1.5 (L) 02/09/2022   ALBUMIN  2.1 (L) 02/06/2022    Lab Results  Component Value Date   MG 2.0 02/12/2022   MG 1.8 02/07/2022   Lab Results  Component Value Date   VD25OH 69.88 02/07/2022    No results found for: "PREALBUMIN"    Latest Ref Rng & Units 04/06/2024   11:30 AM 01/16/2024    1:15 PM 12/28/2023   10:31 AM  CBC EXTENDED  WBC 3.8 - 10.8 Thousand/uL 6.5  7.9  5.0   RBC 3.80 - 5.10 Million/uL 3.52  4.08  3.81   Hemoglobin 11.7 - 15.5 g/dL 41.3  24.4  01.0   HCT 35.0 - 45.0 % 30.7  35.1  32.8   Platelets 140 - 400 Thousand/uL 277  371  255   NEUT# 1,500 - 7,800 cells/uL  4,013  2,610      There is no height or weight on file to calculate BMI.  Orders:  No orders of the defined types were placed in this encounter.  No orders of the defined types were placed in this encounter.  Procedures: No procedures performed  Clinical Data: No additional findings.  ROS:  All other systems negative, except as noted in the HPI. Review of Systems  Objective: Vital Signs: There were no vitals taken for this visit.  Specialty Comments:  No specialty comments available.  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   RLE Wound  R Heel Ulcer 09/08/2023   LLE Cellulitis 09/07/2023   Abscess of right lower leg    Severe protein-calorie malnutrition (HCC)    Peripheral arterial disease (HCC)    Fall    Atrial fibrillation (HCC)    Goals of care, counseling/discussion     Anemia    Fluid collection (edema) in the arms, legs, hands and feet    Cellulitis of right lower extremity 02/06/2022   Chest pain, rule out acute myocardial infarction 03/16/2021   Hypertension 03/16/2021   Hypothyroidism 03/16/2021   Hyperlipemia 03/16/2021   Aortic valve stenosis 03/16/2021   Chronic venous insufficiency of lower extremity 03/16/2021   Short-term memory loss 12/13/2019   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   Primary osteoarthritis of right knee 07/16/2016   Eosinophilia 06/16/2015   Chronic pain disorder    Osteoarthritis    Incisional umbilical hernia, without obstruction or gangrene    Iron deficiency anemia 09/19/2012   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   Depression    GERD (gastroesophageal reflux disease)    takes Omeprazole daily   Heart murmur    mild MS, moderate-severe AS 03/17/21 echo   History of bronchitis    20+ yrs ago   History of kidney stones    3 surgerical removed, 1 passed   History of prolapse of bladder    History of shingles    Hypertension    takes Losartan  daily   Hypothyroidism    takes Synthroid  daily   Joint swelling    Neck pain    bone spurs at base of head per pt   Osteoarthritis    lumbar,cervical,joints   Pneumonia    hx of > 20 yrs ago   Rash 01/15/2024   Rheumatoid arthritis (HCC) 12/19/2023   Shortness of breath    occasionally and with exertion. Albuterol  inhaler as needed   Spinal headache 1991   blood patch placed   Spondylitis (HCC)    Unsteady gait     occasionally   Urinary urgency     Family History  Problem Relation Age of Onset   Stroke Father     Past Surgical History:  Procedure Laterality Date   ABDOMINAL AORTOGRAM W/LOWER EXTREMITY N/A 02/15/2022   Procedure: ABDOMINAL AORTOGRAM W/LOWER EXTREMITY;  Surgeon: Adine Hoof, MD;  Location: [facility] INVASIVE CV LAB;  Service: Cardiovascular;  Laterality: N/A;   ABDOMINAL AORTOGRAM W/LOWER EXTREMITY N/A 09/12/2023   Procedure: ABDOMINAL AORTOGRAM W/LOWER EXTREMITY;  Surgeon: Kayla Part, MD;  Location: [facility] INVASIVE CV LAB;  Service: Cardiovascular;  Laterality: N/A;   ABDOMINAL HYSTERECTOMY     ANTERIOR FUSION CERVICAL SPINE     x2 -C4-7   APPLICATION OF WOUND VAC Right 03/12/2022   Procedure: APPLICATION OF WOUND VAC;  Surgeon: Timothy Ford, MD;  Location[facility] OR;  Service: Orthopedics;  Laterality: Right;   BUNIONECTOMY Bilateral    COLONOSCOPY     CYSTOSCOPY W/ URETEROSCOPY  2012   EYE SURGERY Bilateral    cataract /lens implant   HOLMIUM LASER APPLICATION Left 02/08/2013   Procedure: HOLMIUM LASER APPLICATION;  Surgeon: Willye Harvey, MD;  Location: [facility];  Service: Urology;  Laterality: Left;   I & D EXTREMITY Right 02/10/2022   Procedure: DEBRIDEMENT RIGHT LEG ABSCESS;  Surgeon: Timothy Ford, MD;  Location: [facility] OR;  Service: Orthopedics;  Laterality: Right;   I & D EXTREMITY Right 03/12/2022   Procedure: RIGHT LEG IRRIGATION AND DEBRIDEMENT EXTREMITY;  Surgeon: Timothy Ford, MD;  Location: [facility] OR;  Service: Orthopedics;  Laterality: Right;   INSERTION OF MESH N/A 07/15/2014   Procedure: INSERTION OF MESH;  Surgeon: Eddye Goodie, MD;  Location[facility] OR;  Service: General;  Laterality: N/A;   JOINT REPLACEMENT Right 2012   shoulder   LAPAROSCOPIC CHOLECYSTECTOMY W/ CHOLANGIOGRAPHY  2012   Dr Lucienne Ryder   NASAL SINUS SURGERY     OPEN REDUCTION INTERNAL FIXATION (ORIF) DISTAL RADIAL FRACTURE Left 03/20/2021   Procedure: OPEN REDUCTION INTERNAL  FIXATION (ORIF) DISTAL RADIAL FRACTURE;  Surgeon: Osa Blase, MD;  Location: [facility] OR;  Service: Orthopedics;  Laterality: Left;   RADIOLOGY WITH ANESTHESIA N/A 05/09/2014   Procedure: ADULT SEDATION WITH ANESTHESIA/MRI CERVICAL SPINE WITHOUT CONTRAST;  Surgeon: Medication Radiologist, MD;  Location: [facility] OR;  Service: Radiology;  Laterality: N/A;  DR. HAWKS/MRI   right knee arthroscopy     d/t meniscal tear   SHOULDER ARTHROSCOPY W/ ROTATOR CUFF REPAIR Bilateral three times each over several yrs   SKIN FULL THICKNESS GRAFT Right 03/12/2022   Procedure: SKIN GRAFT FULL THICKNESS;  Surgeon: Timothy Ford, MD;  Location: [facility] OR;  Service: Orthopedics;  Laterality: Right;   SKIN SPLIT GRAFT Right 02/17/2022   Procedure: RIGHT LEG SKIN GRAFT;  Surgeon: Timothy Ford, MD;  Location: [facility] OR;  Service: Orthopedics;  Laterality: Right;   THUMB ARTHROSCOPY Left    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   Procedure: RIGHT TOTAL KNEE ARTHROPLASTY;  Surgeon: Neil Balls, MD;  Location[facility];  Service: Orthopedics;  Laterality: Right;   UMBILICAL HERNIA REPAIR N/A 07/15/2014   Procedure: LAPAROSCOPIC UMBILICAL AND INFRAUMBILICAL HERNIA;  Surgeon: Eddye Goodie, MD;  Location: [facility] OR;  Service: General;  Laterality: N/A;   Social History   Occupational History   Not on file  Tobacco Use   Smoking status: Former    Current packs/day: 0.00    Average packs/day: 0.5 packs/day for 2.0 years (1.0 ttl pk-yrs)    Types: Cigarettes    Start date: 02/06/1990    Quit date: 02/07/1992    Years since quitting: 32.2   Smokeless tobacco: Never  Vaping Use   Vaping status: Never Used  Substance and Sexual Activity   Alcohol  use: No   Drug use: No   Sexual activity: Not on file

## 2024-05-09 ENCOUNTER — Telehealth: Payer: Self-pay | Admitting: Orthopedic Surgery

## 2024-05-09 NOTE — Telephone Encounter (Signed)
 Noted

## 2024-05-09 NOTE — Telephone Encounter (Signed)
 Jamie from [facility] called and just want you to know that she is sending over a request over to be sign for face to face last visit and wound care orders. [phone]

## 2024-05-17 ENCOUNTER — Encounter: Payer: Self-pay | Admitting: Infectious Disease

## 2024-05-17 ENCOUNTER — Ambulatory Visit: Admitting: Infectious Disease

## 2024-05-17 ENCOUNTER — Other Ambulatory Visit: Payer: Self-pay

## 2024-05-17 VITALS — BP 140/60 | HR 83 | Temp 97.8°F

## 2024-05-17 DIAGNOSIS — Z96659 Presence of unspecified artificial knee joint: Secondary | ICD-10-CM | POA: Insufficient documentation

## 2024-05-17 DIAGNOSIS — M069 Rheumatoid arthritis, unspecified: Secondary | ICD-10-CM

## 2024-05-17 DIAGNOSIS — M86361 Chronic multifocal osteomyelitis, right tibia and fibula: Secondary | ICD-10-CM

## 2024-05-17 DIAGNOSIS — I739 Peripheral vascular disease, unspecified: Secondary | ICD-10-CM | POA: Diagnosis not present

## 2024-05-17 DIAGNOSIS — D721 Eosinophilia, unspecified: Secondary | ICD-10-CM

## 2024-05-17 DIAGNOSIS — I872 Venous insufficiency (chronic) (peripheral): Secondary | ICD-10-CM

## 2024-05-17 DIAGNOSIS — L4050 Arthropathic psoriasis, unspecified: Secondary | ICD-10-CM

## 2024-05-17 DIAGNOSIS — R110 Nausea: Secondary | ICD-10-CM

## 2024-05-17 MED ORDER — ONDANSETRON HCL 4 MG PO TABS: 4.0000 mg | ORAL_TABLET | Freq: Two times a day (BID) | ORAL | 3 refills | Status: DC

## 2024-05-17 NOTE — Progress Notes (Signed)
 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   MRN: [id number]  HPI  Discussed the use of AI scribe software for clinical note transcription with the patient, who gave verbal consent to proceed.  History of Present Illness   Isabel Barnes is an 83 year old female with chronic osteomyelitis who presents for follow-up of chronic leg wounds and infection management.  She has infections in the tibia and calcaneus, previously managed with IV and oral antibiotics. The heel ulcer has improved but requires padding to prevent breakdown. The wound is described as a callus and she is currently off antibiotics. There is no leg pain at present.  Wound care includes dressing changes every other day and Vache application. Monthly evaluations show the wound has reduced in size over two years but has not fully closed. Previous treatments with Augmentin  and fluconazole  may have improved some  redness and suspected fungal infection. Athlete's foot powder spray is used for maintenance.  Occasional rashes, possibly related to hydrocodone , are managed with cortisone cream. Nausea, sometimes linked to pain medications, is managed with ondansetron . Recent lab work shows normal sed rate and C-reactive protein. At one point there was fear that she was having allergic rxn to amoxicillin  but this was not the case. She has RA that is only managed with opiates for pain relief though she has had some relief from systemic prednisone  for myalgias and for her neck pain in particular in the past year.       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   Depression    GERD (gastroesophageal reflux disease)    takes Omeprazole daily   Heart murmur    mild MS, moderate-severe AS 03/17/21 echo   History of bronchitis    20+ yrs ago   History of kidney stones    3 surgerical removed, 1 passed   History of prolapse of bladder    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 Albuterol  inhaler as needed   Spinal headache 1991   blood patch placed   Spondylitis (HCC)    Unsteady gait    occasionally   Urinary urgency     Past Surgical History:  Procedure Laterality Date   ABDOMINAL AORTOGRAM W/LOWER EXTREMITY N/A 02/15/2022   Procedure: ABDOMINAL AORTOGRAM W/LOWER EXTREMITY;  Surgeon: Adine Hoof, MD;  Location: [facility];  Service: Cardiovascular;  Laterality: N/A;   ABDOMINAL AORTOGRAM W/LOWER EXTREMITY N/A 09/12/2023   Procedure: ABDOMINAL AORTOGRAM W/LOWER EXTREMITY;  Surgeon: Kayla Part, MD;  Location: [facility] INVASIVE CV [facility];  Service: Cardiovascular;  Laterality: N/A;   ABDOMINAL HYSTERECTOMY     ANTERIOR FUSION CERVICAL SPINE     x2 -C4-7   APPLICATION OF WOUND VAC Right 03/12/2022   Procedure: APPLICATION OF WOUND VAC;  Surgeon: Timothy Ford, MD;  Location: [facility] OR;  Service: Orthopedics;  Laterality: Right;   BUNIONECTOMY Bilateral    COLONOSCOPY     CYSTOSCOPY W/ URETEROSCOPY  2012   EYE SURGERY  Bilateral    cataract /lens implant   HOLMIUM LASER APPLICATION Left 02/08/2013   Procedure: HOLMIUM LASER APPLICATION;  Surgeon: Willye Harvey, MD;  Location: [facility];  Service: Urology;  Laterality: Left;   I & D EXTREMITY Right 02/10/2022   Procedure: DEBRIDEMENT RIGHT LEG ABSCESS;  Surgeon: Timothy Ford, MD;  Location: [facility] OR;  Service: Orthopedics;  Laterality: Right;   I & D EXTREMITY Right 03/12/2022   Procedure: RIGHT LEG IRRIGATION AND DEBRIDEMENT EXTREMITY;  Surgeon: Timothy Ford, MD;  Location: [facility] OR;  Service: Orthopedics;  Laterality: Right;   INSERTION OF MESH N/A 07/15/2014   Procedure: INSERTION OF MESH;  Surgeon: Eddye Goodie, MD;  Location: [facility] OR;  Service: General;  Laterality: N/A;   JOINT REPLACEMENT Right 2012   shoulder   LAPAROSCOPIC CHOLECYSTECTOMY W/ CHOLANGIOGRAPHY  2012   Dr Lucienne Ryder   NASAL SINUS SURGERY     OPEN REDUCTION INTERNAL FIXATION (ORIF) DISTAL RADIAL FRACTURE Left 03/20/2021   Procedure: OPEN REDUCTION INTERNAL FIXATION (ORIF) DISTAL RADIAL FRACTURE;  Surgeon: Osa Blase, MD;  Location: [facility] OR;  Service: Orthopedics;  Laterality: Left;   RADIOLOGY WITH ANESTHESIA N/A 05/09/2014   Procedure: ADULT SEDATION WITH ANESTHESIA/MRI CERVICAL SPINE WITHOUT CONTRAST;  Surgeon: Medication Radiologist, MD;  Location: [facility] OR;  Service: Radiology;  Laterality: N/A;  DR. HAWKS/MRI   right knee arthroscopy     d/t meniscal tear   SHOULDER ARTHROSCOPY W/ ROTATOR CUFF REPAIR Bilateral three times each over several yrs   SKIN FULL THICKNESS GRAFT Right 03/12/2022   Procedure: SKIN GRAFT FULL THICKNESS;  Surgeon: Timothy Ford, MD;  Location[facility];  Service: Orthopedics;  Laterality: Right;   SKIN SPLIT GRAFT Right 02/17/2022   Procedure: RIGHT LEG SKIN GRAFT;  Surgeon: Timothy Ford, MD;  Location: [facility] OR;  Service: Orthopedics;  Laterality: Right;   THUMB ARTHROSCOPY Left    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   Procedure: RIGHT TOTAL KNEE  ARTHROPLASTY;  Surgeon: Neil Balls, MD;  Location: [facility] OR;  Service: Orthopedics;  Laterality: Right;   UMBILICAL HERNIA REPAIR N/A 07/15/2014   Procedure: LAPAROSCOPIC UMBILICAL AND INFRAUMBILICAL HERNIA;  Surgeon: Eddye Goodie, MD;  Location: [facility] OR;  Service: General;  Laterality: N/A;    Family History  Problem Relation Age of Onset   Stroke Father       Social History   Socioeconomic History   Marital status: Divorced    Spouse name: Not on file   Number of children: Not on file   Years of education: Not on file   Highest education level: Not on file  Occupational History   Not on file  Tobacco Use   Smoking status: Former    Current packs/day: 0.00    Average packs/day: 0.5 packs/day for 2.0 years (1.0  ttl pk-yrs)    Types: Cigarettes    Start date: 02/06/1990    Quit date: 02/07/1992    Years since quitting: 32.2   Smokeless tobacco: Never  Vaping Use   Vaping status: Never Used  Substance and Sexual Activity   Alcohol  use: No   Drug use: No   Sexual activity: Not on file  Other Topics Concern   Not on file  Social History Narrative   Not on file   Social Drivers of Health   Financial Resource Strain: Low Risk  (05/02/2023)   Received from [facility]   Overall Financial Resource Strain (CARDIA)    Difficulty of Paying Living Expenses: Not very hard  Food Insecurity: Low Risk  (04/06/2024)   Received from [facility]   Hunger Vital Sign    Within the past 12 months, you worried that your food would run out before you got money to buy more: Never true    Within the past 12 months, the food you bought just didn't last and you didn't have money to get more. : Never true  Transportation Needs: No Transportation Needs (04/06/2024)   Received from [facility]    In the past 12 months, has lack of reliable transportation kept you from medical appointments, meetings, work or from getting things needed for daily living? : No  Physical Activity:  Unknown (05/02/2023)   Received from [facility]   Exercise Vital Sign    On average, how many days per week do you engage in moderate to strenuous exercise (like a brisk walk)?: 0 days    Minutes of Exercise per Session: Not on file  Stress: No Stress Concern Present (05/02/2023)   Received from [facility] - Occupational Stress Questionnaire    Feeling of Stress : Only a little  Social Connections: Moderately Integrated (05/02/2023)   Received from [facility]   Social Network    How would you rate your social network (family, work, friends)?: Adequate participation with social networks    Allergies  Allergen Reactions   Dilaudid  [Hydromorphone  Hcl] Shortness Of Breath   Gabapentin Other (See Comments)    Hoarseness , headache and sore throat   Latex Rash    Severe rash   Lyrica [Pregabalin] Other (See Comments)    No balance , had to walk with cane , Blurred vision,weakness.   Oxycodone  Shortness Of Breath and Other (See Comments)    Tolerated oxycodone  during admission to hospital 02/06/22   Singulair [Montelukast] Shortness Of Breath and Other (See Comments)    Vision issues, also   Ciprofloxacin Hcl Nausea And Vomiting and Other (See Comments)    Nausea and vomiting with by mouth form   Codeine Other (See Comments)    Hallucinations   Methadone Nausea And Vomiting and Other (See Comments)    Severe nausea and vomiting   Metronidazole  Nausea And Vomiting and Other (See Comments)    Gastric pain   Oysters [Shellfish Allergy] Other (See Comments)    Terrible gastric upset and cramping.   Clindamycin /Lincomycin Diarrhea and Nausea Only   Donepezil Diarrhea and Other (See Comments)    Severe diarrhea   Povidone-Iodine  Other (See Comments)   Sulfa Antibiotics Diarrhea and Other (See Comments)    GI issues    Tape Other (See Comments)    ADHESIVE TAPE-Severe rash   Zetia [Ezetimibe] Diarrhea   Elemental Sulfur Nausea And  Vomiting   Iodine  Rash  Other Rash    All Antibiotic ointments/ creams   Oyster Shell Rash   Povidone Iodine  Rash and Other (See Comments)    Oyster shell products- Rash    Skintegrity Hydrogel [Skin Protectants, Misc.] Rash   Tapentadol Other (See Comments)    Nightmares **Nucynta**     Current Outpatient Medications:    acetaminophen  (TYLENOL ) 500 MG tablet, Take 2 tablets (1,000 mg total) by mouth 3 (three) times daily., Disp: 180 tablet, Rfl: 0   amoxicillin -clavulanate (AUGMENTIN ) 875-125 MG tablet, Take 1 tablet by mouth 2 (two) times daily., Disp: 60 tablet, Rfl: 5   aspirin  EC 81 MG tablet, Take 1 tablet (81 mg total) by mouth daily. Swallow whole., Disp: 30 tablet, Rfl: 12   cholecalciferol  (VITAMIN D ) 1000 UNITS tablet, Take 1,000 Units by mouth daily., Disp: , Rfl:    diclofenac  sodium (VOLTAREN ) 1 % GEL, Apply 2 g topically daily as needed (for pain)., Disp: , Rfl:    Digestive Enzymes (ENZYME DIGEST) CAPS, Take 1 capsule by mouth daily as needed (For digestive)., Disp: 30 capsule, Rfl: 0   ENSURE PLUS (ENSURE PLUS) LIQD, Take 237 mLs by mouth daily., Disp: , Rfl:    ferrous sulfate  325 (65 FE) MG tablet, Take 1 tablet (325 mg total) by mouth daily with breakfast., Disp: 30 tablet, Rfl: 0   fish oil-omega-3 fatty acids 1000 MG capsule, Take 1,000 mg by mouth 2 (two) times daily. , Disp: , Rfl:    fluconazole  (DIFLUCAN ) 150 MG tablet, Take 1 tablet (150 mg total) by mouth once a week., Disp: 4 tablet, Rfl: 0   HYDROcodone -acetaminophen  (NORCO) 10-325 MG tablet, Take 1 tablet by mouth in the morning, at noon, and at bedtime., Disp: , Rfl:    Lactobacillus (PROBIOTIC ACIDOPHILUS PO), Take 1 capsule by mouth daily., Disp: , Rfl:    levothyroxine  (SYNTHROID ) 100 MCG tablet, Take 1 tablet (100 mcg total) by mouth every morning., Disp: 30 tablet, Rfl: 0   loperamide (IMODIUM A-D) 2 MG tablet, Take 4 mg by mouth 3 (three) times daily as needed for diarrhea or loose stools., Disp: ,  Rfl:    loratadine  (CLARITIN ) 10 MG tablet, Take 1 tablet (10 mg total) by mouth daily., Disp: 30 tablet, Rfl: 0   losartan  (COZAAR ) 25 MG tablet, Take 1 tablet (25 mg total) by mouth daily. (Patient taking differently: Take 100 mg by mouth daily.), Disp: 30 tablet, Rfl: 0   methocarbamol  (ROBAXIN ) 500 MG tablet, Take 500 mg by mouth every 6 (six) hours as needed for muscle spasms., Disp: , Rfl:    Multiple Vitamins-Minerals (MULTIVITAMIN PO), Take 1 tablet by mouth daily., Disp: , Rfl:    ondansetron  (ZOFRAN ) 4 MG tablet, Take 1 tablet (4 mg total) by mouth 2 (two) times daily. Take before each dose of augmentin  and can take again if needed up to 3 doses per day, Disp: 90 tablet, Rfl: 3   polyethylene glycol (MIRALAX  / GLYCOLAX ) 17 g packet, Take 17 g by mouth daily., Disp: 14 each, Rfl: 2   predniSONE  (DELTASONE ) 10 MG tablet, TAKE 1 TABLET (10 MG TOTAL) BY MOUTH DAILY WITH BREAKFAST., Disp: 30 tablet, Rfl: 0   RESTASIS  0.05 % ophthalmic emulsion, Place 1 drop into both eyes daily., Disp: 0.4 mL, Rfl: 0   silver  sulfADIAZINE  (SILVADENE ) 1 % cream, Apply 1 Application topically 2 (two) times daily., Disp: , Rfl:    tetrahydrozoline 0.05 % ophthalmic solution, Place 1 drop into both eyes 4 (four) times daily.,  Disp: , Rfl:    triamcinolone  (NASACORT ) 55 MCG/ACT AERO nasal inhaler, Place 1-2 sprays into the nose 2 (two) times daily as needed (for seasonal allergies)., Disp: 1 each, Rfl: 3   Turmeric 500 MG CAPS, Take 1 capsule by mouth at bedtime., Disp: , Rfl:    Vitamin D -Vitamin K (K2 PLUS D3) [phone] MCG-UNIT TABS, Take 1 tablet by mouth daily., Disp: , Rfl:    Review of Systems  Constitutional:  Negative for activity change, appetite change, chills, diaphoresis, fatigue, fever and unexpected weight change.  HENT:  Negative for congestion, rhinorrhea, sinus pressure, sneezing, sore throat and trouble swallowing.   Eyes:  Negative for photophobia and visual disturbance.  Respiratory:  Negative  for cough, chest tightness, shortness of breath, wheezing and stridor.   Cardiovascular:  Negative for chest pain, palpitations and leg swelling.  Gastrointestinal:  Negative for abdominal distention, abdominal pain, anal bleeding, blood in stool, constipation, diarrhea, nausea and vomiting.  Genitourinary:  Negative for difficulty urinating, dysuria, flank pain and hematuria.  Musculoskeletal:  Positive for arthralgias. Negative for back pain, gait problem, joint swelling and myalgias.  Skin:  Positive for wound. Negative for color change, pallor and rash.  Neurological:  Negative for dizziness, tremors, weakness and light-headedness.  Hematological:  Negative for adenopathy. Does not bruise/bleed easily.  Psychiatric/Behavioral:  Negative for agitation, behavioral problems, confusion, decreased concentration, dysphoric mood and sleep disturbance.        Objective:   Physical Exam Constitutional:      General: She is not in acute distress.    Appearance: Normal appearance. She is well-developed. She is not ill-appearing or diaphoretic.  HENT:     Head: Normocephalic and atraumatic.     Right Ear: Hearing and external ear normal.     Left Ear: Hearing and external ear normal.     Nose: No nasal deformity or rhinorrhea.   Eyes:     General: No scleral icterus.    Conjunctiva/sclera: Conjunctivae normal.     Right eye: Right conjunctiva is not injected.     Left eye: Left conjunctiva is not injected.     Pupils: Pupils are equal, round, and reactive to light.   Neck:     Vascular: No JVD.   Cardiovascular:     Rate and Rhythm: Normal rate and regular rhythm.     Heart sounds: S1 normal and S2 normal.  Pulmonary:     Effort: Pulmonary effort is normal. No respiratory distress.     Breath sounds: No wheezing.  Abdominal:     General: Bowel sounds are normal. There is no distension.     Palpations: Abdomen is soft.   Musculoskeletal:        General: Normal range of motion.      Right shoulder: Normal.     Left shoulder: Normal.     Cervical back: Normal range of motion and neck supple.     Right hip: Normal.     Left hip: Normal.     Right knee: Normal.     Left knee: Normal.  Lymphadenopathy:     Head:     Right side of head: No submandibular, preauricular or posterior auricular adenopathy.     Left side of head: No submandibular, preauricular or posterior auricular adenopathy.     Cervical: No cervical adenopathy.     Right cervical: No superficial or deep cervical adenopathy.    Left cervical: No superficial or deep cervical adenopathy.   Skin:  General: Skin is warm and dry.     Coloration: Skin is not pale.     Findings: No abrasion, bruising, ecchymosis, erythema, lesion or rash.     Nails: There is no clubbing.   Neurological:     General: No focal deficit present.     Mental Status: She is alert and oriented to person, place, and time.     Sensory: No sensory deficit.     Coordination: Coordination normal.     Gait: Gait normal.   Psychiatric:        Attention and Perception: She is attentive.        Mood and Affect: Mood normal.        Speech: Speech normal.        Behavior: Behavior normal. Behavior is cooperative.        Thought Content: Thought content normal.        Judgment: Judgment normal.    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 No significant pain, wound managed with dressing changes. Previous IV and oral antibiotics, reactive strategy for future infections. Wound not fully closed but improved. Avoided continuous antibiotics to prevent complications from long term use - Continue wound care with dressing changes every other day and monthly evaluations by Dr. Julio Ohm. - Monitor for signs of infection, such as increased pain or redness. - Maintain a reactive strategy for antibiotic use if  signs of infection reappear. with augmentin  on hand  Rheumatoid arthritis Rheumatoid arthritis with chronic pain, particularly in neck. Not well-controlled, not on disease-modifying medications due to infection risk. . Significant pain, prednisone  effective temporarily. - Consider short courses of prednisone  for severe inflammation, with caution regarding infection risk. - Coordinate with primary care for pain management and potential rheumatology consultation if needed.  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 Pain managed with hydrocodone , requires monitoring for side effects and interactions. Ondansetron  used for nausea from pain medication. - Continue hydrocodone  for pain management as prescribed by primary care.   Nausea: - Fill ondansetron  prescription for nausea management, particularly related to pain medication use.  Vascular disease Vascular disease contributing to chronic wounds and potential healing complications.   Possible tinea:  seems to have responded to antifungal treatments used with some improvement. - Continue using antifungal treatments as needed for foot care.  Drug allergy Rash initially linked to Augmentin , later attributed to hydrocodone  and allergies. Improved with cortisone cream. Occasional rashes managed with cortisone cream. - Continue using cortisone cream for any residual skin issues.

## 2024-05-25 ENCOUNTER — Telehealth: Payer: Self-pay | Admitting: Orthopedic Surgery

## 2024-05-25 NOTE — Telephone Encounter (Signed)
 Patient is in need of the wound care supplies. Waiting on RX to be sent in. [facility] # [phone]. She is her care taker.

## 2024-05-25 NOTE — Telephone Encounter (Signed)
 I called and sw pt's caregiver Burnard to advise of below.

## 2024-05-25 NOTE — Telephone Encounter (Signed)
 Order received for [facility] health and this form was stamped and faxed back with most recent F2F note.

## 2024-05-31 ENCOUNTER — Ambulatory Visit: Admitting: Orthopedic Surgery

## 2024-05-31 DIAGNOSIS — L97511 Non-pressure chronic ulcer of other part of right foot limited to breakdown of skin: Secondary | ICD-10-CM | POA: Diagnosis not present

## 2024-05-31 DIAGNOSIS — I872 Venous insufficiency (chronic) (peripheral): Secondary | ICD-10-CM

## 2024-05-31 DIAGNOSIS — I890 Lymphedema, not elsewhere classified: Secondary | ICD-10-CM

## 2024-06-01 ENCOUNTER — Encounter: Payer: Self-pay | Admitting: Orthopedic Surgery

## 2024-06-01 ENCOUNTER — Encounter: Payer: Self-pay | Admitting: Infectious Disease

## 2024-06-04 ENCOUNTER — Other Ambulatory Visit: Payer: Self-pay | Admitting: Orthopedic Surgery

## 2024-06-05 ENCOUNTER — Encounter: Payer: Self-pay | Admitting: Orthopedic Surgery

## 2024-06-05 NOTE — Progress Notes (Signed)
 Office Visit Note   Patient: Isabel Barnes           Date of Birth: 1941-07-06           MRN: [id number] Visit Date: 05/31/2024              Requested by: Hartwell Area, PA-C [address] [place],  [place] [address] PCP: Hartwell Area, [place]  Chief Complaint  Patient presents with   Right Leg - Follow-up   Left Leg - Follow-up      HPI: Patient is an 83 year old woman who presents in follow-up from both lower extremities.  Patient is currently undergoing Vashe dressing changes daily.  Patient states she is concerned about the scaly skin on the left lower extremity across the dorsum of the forefoot.  She has tried antifungal treatment without any changes.  Assessment & Plan: Visit Diagnoses:  1. Venous insufficiency (chronic) (peripheral)   2. Lymphedema   3. Ulcer of toe, right, limited to breakdown of skin (HCC)     Plan: Patient's dermatitis most likely secondary from swelling.  She will try over-the-counter cortisone cream.  Continue with Vashe dressing changes.  Follow-Up Instructions: Return in about 4 weeks (around 06/28/2024).   Ortho Exam  Patient is alert, oriented, no adenopathy, well-dressed, normal affect, normal respiratory effort. Examination the right medial calf wound measures 12 x 6 cm with 75% granulation tissue.  Examination the left lower extremity she has some dermatitis dorsally over the first webspace.  There is brawny dermatitis of both lower extremities.    Imaging: No results found. No images are attached to the encounter.  Labs: Lab Results  Component Value Date   ESRSEDRATE 6 01/16/2024   ESRSEDRATE 6 12/19/2023   ESRSEDRATE 40 (H) 02/07/2022   CRP 42.4 (H) 04/06/2024   CRP <3.0 01/16/2024   CRP 7.6 12/19/2023   REPTSTATUS 09/12/2023 FINAL 09/07/2023   GRAMSTAIN  02/10/2022    RARE WBC PRESENT, PREDOMINANTLY PMN NO ORGANISMS SEEN    CULT  09/07/2023    NO GROWTH 5 DAYS Performed at [facility]  Lab, [address], [place], [place] [address]    [facility] ENTEROCOCCUS FAECALIS 02/10/2022     Lab Results  Component Value Date   ALBUMIN  3.5 09/07/2023   ALBUMIN  1.5 (L) 02/09/2022   ALBUMIN  2.1 (L) 02/06/2022    Lab Results  Component Value Date   MG 2.0 02/12/2022   MG 1.8 02/07/2022   Lab Results  Component Value Date   VD25OH 69.88 02/07/2022    No results found for: PREALBUMIN    Latest Ref Rng & Units 04/06/2024   11:30 AM 01/16/2024    1:15 PM 12/28/2023   10:31 AM  CBC EXTENDED  WBC 3.8 - 10.8 Thousand/uL 6.5  7.9  5.0   RBC 3.80 - 5.10 Million/uL 3.52  4.08  3.81   Hemoglobin 11.7 - 15.5 g/dL 89.9  88.6  89.3   HCT 35.0 - 45.0 % 30.7  35.1  32.8   Platelets 140 - 400 Thousand/uL 277  371  255   NEUT# 1,500 - 7,800 cells/uL  4,013  2,610      There is no height or weight on file to calculate BMI.  Orders:  No orders of the defined types were placed in this encounter.  No orders of the defined types were placed in this encounter.    Procedures: No procedures performed  Clinical Data: No additional findings.  ROS:  All other systems negative, except as noted in the HPI. Review of Systems  Objective: Vital Signs: There were no vitals taken for this visit.  Specialty Comments:  No specialty comments available.  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   RLE Wound  R Heel Ulcer 09/08/2023   LLE Cellulitis 09/07/2023   Abscess of right lower leg    Severe protein-calorie malnutrition (HCC)    Peripheral arterial disease (HCC)    Fall    Atrial fibrillation (HCC)    Goals of care,  counseling/discussion    Anemia    Fluid collection (edema) in the arms, legs, hands and feet    Cellulitis of right lower extremity 02/06/2022   Chest pain, rule out acute myocardial infarction 03/16/2021   Hypertension 03/16/2021   Hypothyroidism 03/16/2021   Hyperlipemia 03/16/2021   Aortic valve stenosis 03/16/2021   Chronic venous insufficiency of lower extremity 03/16/2021   Short-term memory loss 12/13/2019   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   Primary osteoarthritis of right knee 07/16/2016   Eosinophilia 06/16/2015   Chronic pain disorder    Osteoarthritis    Incisional umbilical hernia, without obstruction or gangrene    Iron deficiency anemia 09/19/2012   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   Depression    GERD (gastroesophageal reflux disease)    takes Omeprazole daily   Heart murmur    mild MS, moderate-severe AS 03/17/21 echo   History of bronchitis    20+ yrs ago   History of kidney stones    3 surgerical removed, 1 passed   History of prolapse of bladder    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 Albuterol  inhaler as needed   Spinal  headache 1991   blood patch placed   Spondylitis (HCC)    Unsteady gait    occasionally   Urinary urgency     Family History  Problem Relation Age of Onset   Stroke Father     Past Surgical History:  Procedure Laterality Date   ABDOMINAL AORTOGRAM W/LOWER EXTREMITY N/A 02/15/2022   Procedure: ABDOMINAL AORTOGRAM W/LOWER EXTREMITY;  Surgeon: Sheree Penne Bruckner,  MD;  Location: [facility] INVASIVE CV LAB;  Service: Cardiovascular;  Laterality: N/A;   ABDOMINAL AORTOGRAM W/LOWER EXTREMITY N/A 09/12/2023   Procedure: ABDOMINAL AORTOGRAM W/LOWER EXTREMITY;  Surgeon: Lanis Fonda BRAVO, MD;  Location: [facility] INVASIVE CV LAB;  Service: Cardiovascular;  Laterality: N/A;   ABDOMINAL HYSTERECTOMY     ANTERIOR FUSION CERVICAL SPINE     x2 -C4-7   APPLICATION OF WOUND VAC Right 03/12/2022   Procedure: APPLICATION OF WOUND VAC;  Surgeon: Harden Jerona GAILS, MD;  Location: [facility] OR;  Service: Orthopedics;  Laterality: Right;   BUNIONECTOMY Bilateral    COLONOSCOPY     CYSTOSCOPY W/ URETEROSCOPY  2012   EYE SURGERY Bilateral    cataract /lens implant   HOLMIUM LASER APPLICATION Left 02/08/2013   Procedure: HOLMIUM LASER APPLICATION;  Surgeon: Norleen JINNY Seltzer, MD;  Location: [facility];  Service: Urology;  Laterality: Left;   I & D EXTREMITY Right 02/10/2022   Procedure: DEBRIDEMENT RIGHT LEG ABSCESS;  Surgeon: Harden Jerona GAILS, MD;  Location: [facility] OR;  Service: Orthopedics;  Laterality: Right;   I & D EXTREMITY Right 03/12/2022   Procedure: RIGHT LEG IRRIGATION AND DEBRIDEMENT EXTREMITY;  Surgeon: Harden Jerona GAILS, MD;  Location: [facility] OR;  Service: Orthopedics;  Laterality: Right;   INSERTION OF MESH N/A 07/15/2014   Procedure: INSERTION OF MESH;  Surgeon: Elspeth KYM Schultze, MD;  Location: [facility] OR;  Service: General;  Laterality: N/A;   JOINT REPLACEMENT Right 2012   shoulder   LAPAROSCOPIC CHOLECYSTECTOMY W/ CHOLANGIOGRAPHY  2012   Dr Vernetta   NASAL SINUS SURGERY     OPEN REDUCTION INTERNAL FIXATION (ORIF)  DISTAL RADIAL FRACTURE Left 03/20/2021   Procedure: OPEN REDUCTION INTERNAL FIXATION (ORIF) DISTAL RADIAL FRACTURE;  Surgeon: Josefina Fonda, MD;  Location: [facility] OR;  Service: Orthopedics;  Laterality: Left;   RADIOLOGY WITH ANESTHESIA N/A 05/09/2014   Procedure: ADULT SEDATION WITH ANESTHESIA/MRI CERVICAL SPINE WITHOUT CONTRAST;  Surgeon: Medication Radiologist, MD;  Location: [facility] OR;  Service: Radiology;  Laterality: N/A;  DR. HAWKS/MRI   right knee arthroscopy     d/t meniscal tear   SHOULDER ARTHROSCOPY W/ ROTATOR CUFF REPAIR Bilateral three times each over several yrs   SKIN FULL THICKNESS GRAFT Right 03/12/2022   Procedure: SKIN GRAFT FULL THICKNESS;  Surgeon: Harden Jerona GAILS, MD;  Location: [facility] OR;  Service: Orthopedics;  Laterality: Right;   SKIN SPLIT GRAFT Right 02/17/2022   Procedure: RIGHT LEG SKIN GRAFT;  Surgeon: Harden Jerona GAILS, MD;  Location: [facility] OR;  Service: Orthopedics;  Laterality: Right;   THUMB ARTHROSCOPY Left    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   Procedure: RIGHT TOTAL KNEE ARTHROPLASTY;  Surgeon: Norleen Gavel, MD;  Location: [facility] OR;  Service: Orthopedics;  Laterality: Right;   UMBILICAL HERNIA REPAIR N/A 07/15/2014   Procedure: LAPAROSCOPIC UMBILICAL AND INFRAUMBILICAL HERNIA;  Surgeon: Elspeth KYM Schultze, MD;  Location: [facility] OR;  Service: General;  Laterality: N/A;   Social History   Occupational History   Not on file  Tobacco Use   Smoking status: Former    Current packs/day: 0.00    Average packs/day: 0.5 packs/day for 2.0 years (1.0 ttl pk-yrs)    Types: Cigarettes    Start date: 02/06/1990    Quit date: 02/07/1992    Years since quitting: 32.3   Smokeless tobacco: Never  Vaping Use   Vaping status: Never Used  Substance and Sexual Activity   Alcohol  use: No   Drug use: No   Sexual  activity: Not on file

## 2024-06-28 ENCOUNTER — Encounter: Payer: Self-pay | Admitting: Orthopedic Surgery

## 2024-06-28 ENCOUNTER — Ambulatory Visit: Admitting: Orthopedic Surgery

## 2024-06-28 DIAGNOSIS — L97511 Non-pressure chronic ulcer of other part of right foot limited to breakdown of skin: Secondary | ICD-10-CM | POA: Diagnosis not present

## 2024-06-28 DIAGNOSIS — I890 Lymphedema, not elsewhere classified: Secondary | ICD-10-CM

## 2024-06-28 DIAGNOSIS — I872 Venous insufficiency (chronic) (peripheral): Secondary | ICD-10-CM

## 2024-06-28 NOTE — Progress Notes (Signed)
 Office Visit Note   Patient: Isabel Barnes           Date of Birth: [birth year]           MRN: [id number] Visit Date: 06/28/2024              Requested by: Hartwell Area, PA-C 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  Patient states she started developing some new ulcers on the left.  Assessment & Plan: Visit Diagnoses:  1. Venous insufficiency (chronic) (peripheral)   2. Lymphedema   3. Ulcer of toe, right, limited to breakdown of skin (HCC)     Plan: Continue with cleansing and debridement Vashe dressing changes with compression, elevation.  Follow-Up Instructions: Return in about 4 weeks (around 07/26/2024).   Ortho Exam  Patient is alert, oriented, no adenopathy, well-dressed, normal affect, normal respiratory effort. Examination patient has approximately 75% granulation tissue 25% fibrinous exudative tissue.  Wound measures 12 x 7 cm.  Calf is 42 cm in circumference with brawny indurated edematous tissue.    Imaging: No results found. No images are attached to the encounter.  Labs: Lab Results  Component Value Date   ESRSEDRATE 6 01/16/2024   ESRSEDRATE 6 12/19/2023   ESRSEDRATE 40 (H) 02/07/2022   CRP 42.4 (H) 04/06/2024   CRP <3.0 01/16/2024   CRP 7.6 12/19/2023   REPTSTATUS 09/12/2023 FINAL 09/07/2023   GRAMSTAIN  02/10/2022    RARE WBC PRESENT, PREDOMINANTLY PMN NO ORGANISMS SEEN    CULT  09/07/2023    NO GROWTH 5 DAYS Performed at [facility], [address] [place], [place] [address]    [facility] ENTEROCOCCUS FAECALIS 02/10/2022     Lab Results  Component Value Date   ALBUMIN  3.5 09/07/2023   ALBUMIN  1.5 (L) 02/09/2022    ALBUMIN  2.1 (L) 02/06/2022    Lab Results  Component Value Date   MG 2.0 02/12/2022   MG 1.8 02/07/2022   Lab Results  Component Value Date   VD25OH 69.88 02/07/2022    No results found for: PREALBUMIN    Latest Ref Rng & Units 04/06/2024   11:30 AM 01/16/2024    1:15 PM 12/28/2023   10:31 AM  CBC EXTENDED  WBC 3.8 - 10.8 Thousand/uL 6.5  7.9  5.0   RBC 3.80 - 5.10 Million/uL 3.52  4.08  3.81   Hemoglobin 11.7 - 15.5 g/dL 89.9  88.6  89.3   HCT 35.0 - 45.0 % 30.7  35.1  32.8   Platelets 140 - 400 Thousand/uL 277  371  255   NEUT# 1,500 - 7,800 cells/uL  4,013  2,610      There is no height or weight on file to calculate BMI.  Orders:  No orders of the defined types were placed in this encounter.  No orders of the defined types were placed in this encounter.    Procedures: No procedures performed  Clinical Data: No additional findings.  ROS:  All other systems negative, except as noted in the HPI. Review of Systems  Objective: Vital Signs: There were no vitals taken for this visit.  Specialty Comments:  No specialty  comments available.  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   RLE Wound  R Heel Ulcer 09/08/2023   LLE Cellulitis 09/07/2023   Abscess of right lower leg    Severe protein-calorie malnutrition (HCC)    Peripheral arterial disease (HCC)    Fall    Atrial fibrillation (HCC)    Goals of care, counseling/discussion    Anemia    Fluid collection (edema) in the arms, legs, hands and feet    Cellulitis of right lower extremity 02/06/2022   Chest pain, rule out acute myocardial  infarction 03/16/2021   Hypertension 03/16/2021   Hypothyroidism 03/16/2021   Hyperlipemia 03/16/2021   Aortic valve stenosis 03/16/2021   Chronic venous insufficiency of lower extremity 03/16/2021   Short-term memory loss 12/13/2019   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   Primary osteoarthritis of right knee 07/16/2016   Eosinophilia 06/16/2015   Chronic pain disorder    Osteoarthritis    Incisional umbilical hernia, without obstruction or gangrene    Iron deficiency anemia 09/19/2012   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   Depression    GERD (gastroesophageal reflux disease)    takes Omeprazole daily   Heart murmur    mild MS, moderate-severe AS 03/17/21 echo   History of bronchitis    20+ yrs ago   History of kidney stones    3 surgerical removed, 1 passed   History of prolapse of bladder    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 Albuterol  inhaler as needed   Spinal headache 1991   blood patch placed   Spondylitis (HCC)    Unsteady gait    occasionally   Urinary urgency     Family History  Problem Relation Age of Onset   Stroke Father      Past Surgical History:  Procedure Laterality Date   ABDOMINAL AORTOGRAM W/LOWER EXTREMITY N/A 02/15/2022   Procedure: ABDOMINAL AORTOGRAM W/LOWER EXTREMITY;  Surgeon: Sheree Penne Bruckner, MD;  Location: [facility] INVASIVE CV LAB;  Service: Cardiovascular;  Laterality: N/A;   ABDOMINAL AORTOGRAM W/LOWER EXTREMITY N/A 09/12/2023   Procedure: ABDOMINAL AORTOGRAM W/LOWER EXTREMITY;  Surgeon: Lanis,  Fonda BRAVO, MD;  Location: [facility] INVAS[facility] CV LAB;  Service: Cardiovascular;  Laterality: N/A;   ABDOMINAL HYSTERECTOMY     ANTERIOR FUSION CERVICAL SPINE     x2 -C4-7   APPLICATION OF WOUND VAC Right 03/12/2022   Procedure: APPLICATION OF WOUND VAC;  Surgeon: Harden Jerona GAILS, MD;  Location: [facility] OR;  Service: Orthopedics;  Laterality: Right;   BUNIONECTOMY Bilateral    COLONOSCOPY     CYSTOSCOPY W/ URETEROSCOPY  2012   EYE SURGERY Bilateral    cataract /lens implant   HOLMIUM LASER APPLICATION Left 02/08/2013   Procedure: HOLMIUM LASER APPLICATION;  Surgeon: Norleen JINNY Seltzer, MD;  Location: [facility];  Service: Urology;  Laterality: Left;   I & D EXTREMITY Right 02/10/2022   Procedure: DEBRIDEMENT RIGHT LEG ABSCESS;  Surgeon: Harden Jerona GAILS, MD;  Location: [facility] OR;  Service: Orthopedics;  Laterality: Right;   I & D EXTREMITY Right 03/12/2022   Procedure: RIGHT LEG IRRIGATION AND DEBRIDEMENT EXTREMITY;  Surgeon: Harden Jerona GAILS, MD;  Location: [facility] OR;  Service: Orthopedics;  Laterality: Right;   INSERTION OF MESH N/A 07/15/2014   Procedure: INSERTION OF MESH;  Surgeon: Elspeth KYM Schultze, MD;  Location: [facility] OR;  Service: General;  Laterality: N/A;   JOINT REPLACEMENT Right 2012   shoulder   LAPAROSCOPIC CHOLECYSTECTOMY W/ CHOLANGIOGRAPHY  2012   Dr Vernetta   NASAL SINUS SURGERY     OPEN REDUCTION INTERNAL FIXATION (ORIF) DISTAL RADIAL FRACTURE Left 03/20/2021   Procedure: OPEN REDUCTION INTERNAL FIXATION (ORIF) DISTAL RADIAL FRACTURE;  Surgeon: Josefina Fonda, MD;  Location: [facility] OR;  Service: Orthopedics;   Laterality: Left;   RADIOLOGY WITH ANESTHESIA N/A 05/09/2014   Procedure: ADULT SEDATION WITH ANESTHESIA/MRI CERVICAL SPINE WITHOUT CONTRAST;  Surgeon: Medication Radiologist, MD;  Location: [facility] OR;  Service: Radiology;  Laterality: N/A;  DR. HAWKS/MRI   right knee arthroscopy     d/t meniscal tear   SHOULDER ARTHROSCOPY W/ ROTATOR CUFF REPAIR Bilateral three times each over several yrs   SKIN FULL THICKNESS GRAFT Right 03/12/2022   Procedure: SKIN GRAFT FULL THICKNESS;  Surgeon: Harden Jerona GAILS, MD;  Location: [facility] OR;  Service: Orthopedics;  Laterality: Right;   SKIN SPLIT GRAFT Right 02/17/2022   Procedure: RIGHT LEG SKIN GRAFT;  Surgeon: Harden Jerona GAILS, MD;  Location: [facility] OR;  Service: Orthopedics;  Laterality: Right;   THUMB ARTHROSCOPY Left    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   Procedure: RIGHT TOTAL KNEE ARTHROPLASTY;  Surgeon: Norleen Gavel, MD;  Location: [facility] OR;  Service: Orthopedics;  Laterality: Right;   UMBILICAL HERNIA REPAIR N/A 07/15/2014   Procedure: LAPAROSCOPIC UMBILICAL AND INFRAUMBILICAL HERNIA;  Surgeon: Elspeth KYM Schultze, MD;  Location: [facility] OR;  Service: General;  Laterality: N/A;   Social History   Occupational History   Not on file  Tobacco Use   Smoking status: Former    Current packs/day: 0.00    Average packs/day: 0.5 packs/day for 2.0 years (1.0 ttl pk-yrs)    Types: Cigarettes    Start date: 02/06/1990    Quit date: 02/07/1992    Years since quitting: 32.4   Smokeless tobacco: Never  Vaping Use   Vaping status: Never Used  Substance and Sexual Activity   Alcohol  use: No   Drug use: No   Sexual activity: Not on file

## 2024-07-09 ENCOUNTER — Other Ambulatory Visit: Payer: Self-pay | Admitting: Orthopedic Surgery

## 2024-07-26 ENCOUNTER — Encounter: Payer: Self-pay | Admitting: Orthopedic Surgery

## 2024-07-26 ENCOUNTER — Ambulatory Visit: Admitting: Orthopedic Surgery

## 2024-07-26 ENCOUNTER — Other Ambulatory Visit (INDEPENDENT_AMBULATORY_CARE_PROVIDER_SITE_OTHER): Payer: Self-pay

## 2024-07-26 DIAGNOSIS — I872 Venous insufficiency (chronic) (peripheral): Secondary | ICD-10-CM

## 2024-07-26 DIAGNOSIS — M79671 Pain in right foot: Secondary | ICD-10-CM

## 2024-07-26 DIAGNOSIS — I890 Lymphedema, not elsewhere classified: Secondary | ICD-10-CM | POA: Diagnosis not present

## 2024-07-26 DIAGNOSIS — G8929 Other chronic pain: Secondary | ICD-10-CM

## 2024-07-26 DIAGNOSIS — L97914 Non-pressure chronic ulcer of unspecified part of right lower leg with necrosis of bone: Secondary | ICD-10-CM

## 2024-07-26 NOTE — Progress Notes (Signed)
 Office Visit Note   Patient: Isabel Barnes           Date of Birth: 1941-04-25           MRN: [id number] Visit Date: 07/26/2024              Requested by: Hartwell Area, PA-C [address] [place],  [place] [address] PCP: Hartwell Area, PA-C  Chief Complaint  Patient presents with   Right Leg - Pain   Left Leg - Pain   Right Foot - Pain      HPI: Discussed the use of AI scribe software for clinical note transcription with the patient, who gave verbal consent to proceed.  History of Present Illness Isabel Barnes is an 83 year old female with peripheral vascular disease who presents with heel pain and leg swelling.  She experiences significant heel pain, which has recently become unusually severe. She reports that all the cushion on her heel is gone, and she feels like there is only bone and skin left, which causes pain. She has been using additional heel pads for relief. There is a history of thin skin and minimal padding on her feet.  She has been experiencing increased swelling and redness in her legs this month. Her primary care doctor prescribed Lasix , but she could only tolerate it for three days due to frequent urination and difficulty moving to the bathroom. The swelling persists despite elevation and use of Lasix , and she describes her legs as tight and red.  She has a chronic leg ulcer on the medial right calf, measuring 12.5 by 8 centimeters. She has been using Silvadene  cream due to a shortage of Vosh. She reports that the ulcer is not healing well.  She experiences chronic pain throughout her body, which has been difficult to manage. Her primary care doctor has referred her to hospice for further evaluation due to the uncontrolled pain and frustration with her condition.     Assessment & Plan: Visit Diagnoses:  1. Chronic heel pain, right   2. Venous insufficiency (chronic) (peripheral)   3. Lymphedema   4. 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 No infection on x-ray. - Apply extra heel pad for cushioning.  Lymphedema of right lower extremity with associated swelling and dermatitis Chronic lymphedema with significant swelling and redness due to poor circulation. Lasix  previously ineffective. Redness due to lymphedema, not cellulitis. - Encourage leg elevation when sitting. - Advise regular exercise to promote circulation. - Consider another three-day course of Lasix  with electrolyte supplementation.  Chronic right medial calf ulcer Chronic ulcer on right medial calf, 12.5 by 8 cm, with 75% granulation and 25% fibrinous tissue. No infection. Poor healing due to circulation issues and lymphedema. - Apply Vosh for wound care.  Peripheral vascular disease of right lower extremity Significant peripheral vascular disease with arterial calcification, affecting circulation, wound healing, and exacerbating lymphedema.  Goals of Care Discussed hospice referral due to uncontrolled pain and chronic conditions. She is considering hospice for additional support.      Follow-Up Instructions: Return in about 4 weeks (around 08/23/2024).   Ortho Exam  Patient is alert, oriented, no adenopathy, well-dressed, normal affect, normal respiratory effort. Physical Exam EXTREMITIES: Lymphedema with indurated dense soft tissue  from foot to thigh. Heel pad very thin and atrophic. No signs of infection in the hind foot. SKIN: No cellulitis, dermatitis of the legs. Chronic wound with 75% granulation tissue, 25% fibrinous tissue. No signs of infection. Leg ulcer 12.5x8 cm on medial right calf.      Imaging: XR Os Calcis Right Result Date: 07/26/2024 2 view radiographs of the right hindfoot shows calcification of the posterior  tibial and anterior tibial arteries.  There is sclerotic changes to the os calcis.  No destructive bony lesions.  No images are attached to the encounter.  Labs: Lab Results  Component Value Date   ESRSEDRATE 6 01/16/2024   ESRSEDRATE 6 12/19/2023   ESRSEDRATE 40 (H) 02/07/2022   CRP 42.4 (H) 04/06/2024   CRP <3.0 01/16/2024   CRP 7.6 12/19/2023   REPTSTATUS 09/12/2023 FINAL 09/07/2023   GRAMSTAIN  02/10/2022    RARE WBC PRESENT, PREDOMINANTLY PMN NO ORGANISMS SEEN    CULT  09/07/2023    NO GROWTH 5 DAYS Performed at [facility], [address], [place], [place] [address]    [facility] ENTEROCOCCUS FAECALIS 02/10/2022     Lab Results  Component Value Date   ALBUMIN  3.5 09/07/2023   ALBUMIN  1.5 (L) 02/09/2022   ALBUMIN  2.1 (L) 02/06/2022    Lab Results  Component Value Date   MG 2.0 02/12/2022   MG 1.8 02/07/2022   Lab Results  Component Value Date   VD25OH 69.88 02/07/2022    No results found for: PREALBUMIN    Latest Ref Rng & Units 04/06/2024   11:30 AM 01/16/2024    1:15 PM 12/28/2023   10:31 AM  CBC EXTENDED  WBC 3.8 - 10.8 Thousand/uL 6.5  7.9  5.0   RBC 3.80 - 5.10 Million/uL 3.52  4.08  3.81   Hemoglobin 11.7 - 15.5 g/dL 89.9  88.6  89.3   HCT 35.0 - 45.0 % 30.7  35.1  32.8   Platelets 140 - 400 Thousand/uL 277  371  255   NEUT# 1,500 - 7,800 cells/uL  4,013  2,610      There is no height or weight on file to calculate BMI.  Orders:  Orders Placed This Encounter  Procedures   XR Os Calcis Right   No orders of the defined types were placed in this encounter.    Procedures: No procedures performed  Clinical Data: No additional findings.  ROS:  All other systems negative, except as noted in the HPI. Review of Systems  Objective: Vital Signs: There were no vitals taken for this visit.  Specialty Comments:  No specialty comments available.  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   RLE Wound  R Heel Ulcer 09/08/2023   LLE Cellulitis 09/07/2023   Abscess of right lower leg    Severe protein-calorie malnutrition (HCC)    Peripheral arterial disease (HCC)    Fall    Atrial fibrillation (HCC)    Goals of care, counseling/discussion    Anemia    Fluid collection (edema) in the arms, legs, hands and feet    Cellulitis of right lower extremity 02/06/2022  Chest pain, rule out acute myocardial infarction 03/16/2021   Hypertension 03/16/2021   Hypothyroidism 03/16/2021   Hyperlipemia 03/16/2021   Aortic valve stenosis 03/16/2021   Chronic venous insufficiency of lower extremity 03/16/2021   Short-term memory loss 12/13/2019   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   Primary osteoarthritis of right knee 07/16/2016   Eosinophilia 06/16/2015   Chronic pain disorder    Osteoarthritis    Incisional umbilical hernia, without obstruction or gangrene    Iron deficiency anemia 09/19/2012   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   Depression    GERD (gastroesophageal reflux disease)    takes Omeprazole daily   Heart murmur    mild MS, moderate-severe AS 03/17/21 echo   History of bronchitis    20+ yrs ago    History of kidney stones    3 surgerical removed, 1 passed   History of prolapse of bladder    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 Albuterol  inhaler as needed   Spinal headache 1991   blood patch placed   Spondylitis (HCC)    Unsteady gait    occasionally   Urinary urgency     Family History  Problem Relation Age of Onset   Stroke Father     Past Surgical History:  Procedure Laterality Date   ABDOMINAL AORTOGRAM W/LOWER EXTREMITY N/A 02/15/2022   Procedure: ABDOMINAL AORTOGRAM W/LOWER EXTREMITY;  Surgeon: Sheree Penne Bruckner, MD;  Location: [facility] INVASIVE CV LAB;  Service: Cardiovascular;  Laterality: N/A;   ABDOMINAL AORTOGRAM W/LOWER EXTREMITY N/A 09/12/2023   Procedure: ABDOMINAL AORTOGRAM W/LOWER EXTREMITY;  Surgeon: Lanis Fonda BRAVO, MD;  Location: [facility] INVASIVE CV LAB;  Service: Cardiovascular;  Laterality: N/A;   ABDOMINAL HYSTERECTOMY     ANTERIOR FUSION CERVICAL SPINE     x2 -C4-7   APPLICATION OF WOUND VAC Right 03/12/2022   Procedure: APPLICATION OF WOUND VAC;  Surgeon: Harden Jerona GAILS, MD;  Location: [facility] OR;  Service: Orthopedics;  Laterality: Right;   BUNIONECTOMY Bilateral    COLONOSCOPY     CYSTOSCOPY W/ URETEROSCOPY  2012   EYE SURGERY Bilateral    cataract /lens implant   HOLMIUM LASER APPLICATION Left 02/08/2013   Procedure: HOLMIUM LASER APPLICATION;  Surgeon: Norleen JINNY Seltzer, MD;  Location: [facility];  Service: Urology;  Laterality: Left;   I & D EXTREMITY Right 02/10/2022   Procedure: DEBRIDEMENT RIGHT LEG ABSCESS;  Surgeon: Harden Jerona GAILS, MD;  Location: [facility]  OR;  Service:  Orthopedics;  Laterality: Right;   I & D EXTREMITY Right 03/12/2022   Procedure: RIGHT LEG IRRIGATION AND DEBRIDEMENT EXTREMITY;  Surgeon: Harden Jerona GAILS, MD;  Location: [facility] OR;  Service: Orthopedics;  Laterality: Right;   INSERTION OF MESH N/A 07/15/2014   Procedure: INSERTION OF MESH;  Surgeon: Elspeth KYM Schultze, MD;  Location: [facility] OR;  Service: General;  Laterality: N/A;   JOINT REPLACEMENT Right 2012   shoulder   LAPAROSCOPIC CHOLECYSTECTOMY W/ CHOLANGIOGRAPHY  2012   Dr Vernetta   NASAL SINUS SURGERY     OPEN REDUCTION INTERNAL FIXATION (ORIF) DISTAL RADIAL FRACTURE Left 03/20/2021   Procedure: OPEN REDUCTION INTERNAL FIXATION (ORIF) DISTAL RADIAL FRACTURE;  Surgeon: Josefina Chew, MD;  Location: [facility] OR;  Service: Orthopedics;  Laterality: Left;   RADIOLOGY WITH ANESTHESIA N/A 05/09/2014   Procedure: ADULT SEDATION WITH ANESTHESIA/MRI CERVICAL SPINE WITHOUT CONTRAST;  Surgeon: Medication Radiologist, MD;  Location: [facility] OR;  Service: Radiology;  Laterality: N/A;  DR. HAWKS/MRI   right knee arthroscopy     d/t meniscal tear   SHOULDER ARTHROSCOPY W/ ROTATOR CUFF REPAIR Bilateral three times each over several yrs   SKIN FULL THICKNESS GRAFT Right 03/12/2022   Procedure: SKIN GRAFT FULL THICKNESS;  Surgeon: Harden Jerona GAILS, MD;  Location: [facility] OR;  Service: Orthopedics;  Laterality: Right;   SKIN SPLIT GRAFT Right 02/17/2022   Procedure: RIGHT LEG SKIN GRAFT;  Surgeon: Harden Jerona GAILS, MD;  Location: [facility] OR;  Service: Orthopedics;  Laterality: Right;   THUMB ARTHROSCOPY Left    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   Procedure: RIGHT TOTAL KNEE ARTHROPLASTY;  Surgeon: Norleen Gavel, MD;  Location: [facility] OR;  Service: Orthopedics;  Laterality: Right;   UMBILICAL HERNIA REPAIR N/A 07/15/2014   Procedure: LAPAROSCOPIC UMBILICAL AND INFRAUMBILICAL HERNIA;  Surgeon: Elspeth KYM Schultze, MD;  Location: [facility] OR;  Service: General;  Laterality: N/A;   Social History   Occupational History   Not on file   Tobacco Use   Smoking status: Former    Current packs/day: 0.00    Average packs/day: 0.5 packs/day for 2.0 years (1.0 ttl pk-yrs)    Types: Cigarettes    Start date: 02/06/1990    Quit date: 02/07/1992    Years since quitting: 32.4   Smokeless tobacco: Never  Vaping Use   Vaping status: Never Used  Substance and Sexual Activity   Alcohol  use: No   Drug use: No   Sexual activity: Not on file

## 2024-08-07 ENCOUNTER — Other Ambulatory Visit: Payer: Self-pay | Admitting: Orthopedic Surgery

## 2024-08-23 ENCOUNTER — Ambulatory Visit: Admitting: Orthopedic Surgery

## 2024-08-27 ENCOUNTER — Ambulatory Visit: Admitting: Orthopedic Surgery

## 2024-08-27 DIAGNOSIS — I872 Venous insufficiency (chronic) (peripheral): Secondary | ICD-10-CM

## 2024-08-27 DIAGNOSIS — L97914 Non-pressure chronic ulcer of unspecified part of right lower leg with necrosis of bone: Secondary | ICD-10-CM

## 2024-08-27 DIAGNOSIS — I890 Lymphedema, not elsewhere classified: Secondary | ICD-10-CM | POA: Diagnosis not present

## 2024-08-28 ENCOUNTER — Encounter: Payer: Self-pay | Admitting: Orthopedic Surgery

## 2024-08-28 NOTE — Progress Notes (Signed)
 Office Visit Note   Patient: Isabel Barnes           Date of Birth: [birth year]           MRN: [id number] Visit Date: 08/27/2024              Requested by: Hartwell Area, PA-C [address] [place],  [place] [address] PCP: Hartwell Area, [place]  Chief Complaint  Patient presents with   Right Leg - Wound Check      HPI: Discussed the use of AI scribe software for clinical note transcription with the patient, who gave verbal consent to proceed.  History of Present Illness Isabel Barnes is an 83 year old female with rheumatoid and osteoarthritis who presents with stable lymphedema in both lower extremities.  She has stable lymphedema in both lower extremities and uses lymphedema pumps when she has assistance, as she has fallen trying to put them on by herself. Occasionally, there is some drainage from the right lower extremity. She continues with her dressing changes and the use of lymphedema pumps.  She has a history of rheumatoid and osteoarthritis. She has not seen a rheumatologist in several years and uses Voltaren  gel for symptom management.     Assessment & Plan: Visit Diagnoses:  1. Venous insufficiency (chronic) (peripheral)   2. Lymphedema   3. Chronic ulcer of right lower extremity with necrosis of bone (HCC)     Plan: Assessment and Plan Assessment & Plan Chronic right lower extremity wound Chronic wound on right lower extremity, 7x13 cm, with healthy granulation tissue. - Continue dressing changes.  Bilateral lower extremity lymphedema Lymphedema in both lower extremities. Uses pumps with assistance due to fall risk. - Continue use of lymphedema pumps with assistance.  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 Using Voltaren  gel. Consideration for rheumatology referral for disease-modifying drugs. - Consider  referral to rheumatology for possible disease-modifying drugs.      Follow-Up Instructions: No follow-ups on file.   Ortho Exam  Patient is alert, oriented, no adenopathy, well-dressed, normal affect, normal respiratory effort. Physical Exam   On examination patient has a wound that is 7 x 13 cm with brawny lymph edema.  She is currently using lymphedema pumps.  Heel ulcer is stable.  There is no cellulitis no drainage no signs of infection.    Imaging: No results found. No images are attached to the encounter.  Labs: Lab Results  Component Value Date   ESRSEDRATE 6 01/16/2024   ESRSEDRATE 6 12/19/2023   ESRSEDRATE 40 (H) 02/07/2022   CRP 42.4 (H) 04/06/2024   CRP <3.0 01/16/2024   CRP 7.6 12/19/2023   REPTSTATUS 09/12/2023 FINAL 09/07/2023   GRAMSTAIN  02/10/2022    RARE WBC PRESENT, PREDOMINANTLY PMN NO ORGANISMS SEEN    CULT  09/07/2023    NO GROWTH 5 DAYS Performed at [facility], [address] [place], [place] [address]    [facility] ENTEROCOCCUS MARIJO 02/10/2022     Lab Results  Component Value Date   ALBUMIN  3.5 09/07/2023   ALBUMIN  1.5 (L) 02/09/2022   ALBUMIN  2.1 (L) 02/06/2022    Lab Results  Component Value Date   MG 2.0 02/12/2022   MG 1.8 02/07/2022   Lab Results  Component Value Date   VD25OH 69.88 02/07/2022    No results found for: PREALBUMIN  Latest Ref Rng & Units 04/06/2024   11:30 AM 01/16/2024    1:15 PM 12/28/2023   10:31 AM  CBC EXTENDED  WBC 3.8 - 10.8 Thousand/uL 6.5  7.9  5.0   RBC 3.80 - 5.10 Million/uL 3.52  4.08  3.81   Hemoglobin 11.7 - 15.5 g/dL 89.9  88.6  89.3   HCT 35.0 - 45.0 % 30.7  35.1  32.8   Platelets 140 - 400 Thousand/uL 277  371  255   NEUT# 1,500 - 7,800 cells/uL  4,013  2,610      There is no height or weight on file to calculate BMI.  Orders:  No orders of the defined types were placed in this encounter.  No orders of the defined types were placed in this encounter.     Procedures: No procedures performed  Clinical Data: No additional findings.  ROS:  All other systems negative, except as noted in the HPI. Review of Systems  Objective: Vital Signs: There were no vitals taken for this visit.  Specialty Comments:  No specialty comments available.  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   RLE Wound  R Heel Ulcer 09/08/2023   LLE Cellulitis 09/07/2023   Abscess of right lower leg    Severe protein-calorie malnutrition    Peripheral arterial disease    Fall    Atrial fibrillation (HCC)    Goals of care, counseling/discussion    Anemia    Fluid collection (edema) in the arms, legs, hands and feet    Cellulitis of right lower extremity 02/06/2022   Chest pain, rule out acute myocardial infarction 03/16/2021   Hypertension 03/16/2021   Hypothyroidism 03/16/2021   Hyperlipemia 03/16/2021   Aortic valve stenosis 03/16/2021   Chronic venous insufficiency of lower extremity 03/16/2021   Short-term memory loss 12/13/2019   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   Primary osteoarthritis of right knee 07/16/2016   Eosinophilia 06/16/2015   Chronic pain disorder    Osteoarthritis    Incisional umbilical hernia, without obstruction or gangrene    Iron deficiency anemia 09/19/2012   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   AKI (acute kidney injury) 09/08/2023   Anemia    iron deficiency hx.has had iron infusions before    Chronic low back pain    Chronic pain disorder    Complication of anesthesia    severe claustrophobia    Constipation    r/t use of pain meds.Takes OTC meds or eats prunes   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   Depression    GERD (gastroesophageal reflux disease)    takes Omeprazole daily   Heart murmur    mild MS, moderate-severe AS 03/17/21 echo   History of bronchitis    20+ yrs ago   History of kidney stones    3 surgerical removed, 1 passed   History of prolapse of bladder    History of revision of total knee arthroplasty 05/17/2024   History of shingles    Hypertension    takes Losartan   daily   Hypothyroidism    takes Synthroid  daily   Joint swelling    Neck pain    bone spurs at base of head per pt   Osteoarthritis    lumbar,cervical,joints   Pneumonia    hx of > 20 yrs ago   Rash 01/15/2024   Rheumatoid arthritis (HCC) 12/19/2023   Shortness of breath    occasionally and with exertion. Albuterol  inhaler as needed   Spinal headache 1991   blood patch placed   Spondylitis    Unsteady gait    occasionally   Urinary urgency     Family History  Problem Relation Age of Onset   Stroke Father     Past Surgical History:  Procedure Laterality Date   ABDOMINAL AORTOGRAM W/LOWER EXTREMITY N/A 02/15/2022   Procedure: ABDOMINAL AORTOGRAM W/LOWER EXTREMITY;  Surgeon: Sheree Penne Bruckner, MD;  Location: [facility] INVASIVE CV LAB;  Service: Cardiovascular;  Laterality: N/A;   ABDOMINAL AORTOGRAM W/LOWER EXTREMITY N/A 09/12/2023   Procedure: ABDOMINAL AORTOGRAM W/LOWER EXTREMITY;  Surgeon: Lanis Fonda BRAVO, MD;  Location: [facility] INVASIVE CV LAB;  Service: Cardiovascular;  Laterality: N/A;   ABDOMINAL HYSTERECTOMY     ANTERIOR FUSION CERVICAL SPINE     x2 -C4-7   APPLICATION OF WOUND VAC Right 03/12/2022   Procedure: APPLICATION OF WOUND VAC;  Surgeon: Harden Jerona GAILS, MD;  Location: [facility] OR;  Service: Orthopedics;  Laterality: Right;   BUNIONECTOMY Bilateral    COLONOSCOPY     CYSTOSCOPY W/ URETEROSCOPY  2012   EYE  SURGERY Bilateral    cataract /lens implant   HOLMIUM LASER APPLICATION Left 02/08/2013   Procedure: HOLMIUM LASER APPLICATION;  Surgeon: Norleen JINNY Seltzer, MD;  Location: [facility];  Service: Urology;  Laterality: Left;   I & D EXTREMITY Right 02/10/2022   Procedure: DEBRIDEMENT RIGHT LEG ABSCESS;  Surgeon: Harden Jerona GAILS, MD;  Location: [facility] OR;  Service: Orthopedics;  Laterality: Right;   I & D EXTREMITY Right 03/12/2022   Procedure: RIGHT LEG IRRIGATION AND DEBRIDEMENT EXTREMITY;  Surgeon: Harden Jerona GAILS, MD;  Location: [facility] OR;  Service: Orthopedics;  Laterality: Right;   INSERTION OF MESH N/A 07/15/2014   Procedure: INSERTION OF MESH;  Surgeon: Elspeth KYM Schultze, MD;  Location: [facility] OR;  Service: General;  Laterality: N/A;   JOINT REPLACEMENT Right 2012   shoulder   LAPAROSCOPIC CHOLECYSTECTOMY W/ CHOLANGIOGRAPHY  2012   Dr Vernetta   NASAL SINUS SURGERY     OPEN REDUCTION INTERNAL FIXATION (ORIF) DISTAL RADIAL FRACTURE Left 03/20/2021   Procedure: OPEN REDUCTION INTERNAL FIXATION (ORIF) DISTAL RADIAL FRACTURE;  Surgeon: Josefina Fonda, MD;  Location: [facility] OR;  Service: Orthopedics;  Laterality: Left;   RADIOLOGY WITH ANESTHESIA N/A 05/09/2014   Procedure: ADULT SEDATION WITH ANESTHESIA/MRI CERVICAL SPINE WITHOUT CONTRAST;  Surgeon: Medication Radiologist, MD;  Location: [facility] OR;  Service: Radiology;  Laterality: N/A;  DR. HAWKS/MRI   right knee arthroscopy     d/t meniscal tear   SHOULDER ARTHROSCOPY W/ ROTATOR CUFF REPAIR Bilateral three times each over several yrs   SKIN FULL THICKNESS GRAFT Right 03/12/2022   Procedure: SKIN GRAFT FULL THICKNESS;  Surgeon: Harden Jerona GAILS, MD;  Location: [facility] OR;  Service: Orthopedics;  Laterality: Right;   SKIN SPLIT GRAFT Right 02/17/2022   Procedure: RIGHT LEG SKIN GRAFT;  Surgeon: Harden Jerona GAILS, MD;  Location: [facility] OR;  Service: Orthopedics;  Laterality: Right;   THUMB ARTHROSCOPY Left    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  Procedure: RIGHT TOTAL  KNEE ARTHROPLASTY;  Surgeon: Norleen Gavel, MD;  Location: [facility] OR;  Service: Orthopedics;  Laterality: Right;   UMBILICAL HERNIA REPAIR N/A 07/15/2014   Procedure: LAPAROSCOPIC UMBILICAL AND INFRAUMBILICAL HERNIA;  Surgeon: Elspeth KYM Schultze, MD;  Location: [facility] OR;  Service: General;  Laterality: N/A;   Social History   Occupational History   Not on file  Tobacco Use   Smoking status: Former    Current packs/day: 0.00    Average packs/day: 0.5 packs/day for 2.0 years (1.0 ttl pk-yrs)    Types: Cigarettes    Start date: 02/06/1990    Quit date: 02/07/1992    Years since quitting: 32.5   Smokeless tobacco: Never  Vaping Use   Vaping status: Never Used  Substance and Sexual Activity   Alcohol  use: No   Drug use: No   Sexual activity: Not on file

## 2024-09-24 NOTE — Progress Notes (Signed)
 Patient ID:  Isabel Barnes is a 83 y.o. (DOB 07-22-1941) female.    Assessment and Plan   Assessment & Plan 1. Iron deficiency anemia. Hemoglobin level has shown improvement, currently at 11.4. White blood cell count remains within the normal range at 8.6, and platelet count is also normal at 299,000.  Potential causes for her anemia were discussed including anemia of chronic disease and mild dysplastic syndrome.  If her hemoglobin drops below 10, Aranesp injections would be considered.  Iron levels are pending and will be followed up on.   2. Rheumatoid arthritis. Longstanding rheumatoid arthritis causing significant arthralgias. Not currently on treatment. Continue to monitor.  3. Chronic wound. Persistent wound on the leg, cared for daily. Continue to monitor.  Follow-up Follow up in approximately 4 months.    Subjective   Patient ID:  Isabel Barnes is a 83 y.o. (DOB [birth year]) female    Patient presents with  . Follow-up    History of Present Illness The patient is an 83 year old female who presents for a routine follow-up for iron deficiency anemia.  She reports experiencing fatigue, which she attributes to persistent pain in the back of her head and neck. She has rheumatoid arthritis but is not currently on any medication for it. Neck injections have been administered recently, but they have not provided relief. She is hoping for improvement over time. Additionally, she is scheduled to receive a gel injection in her knee to help with pain management.  She does not report any bleeding, dizziness, chest pain, or shortness of breath. Mobility within her home is maintained using a walker, and she receives assistance from her caregiver with daily activities such as cooking. She manages independently at night.  A wound on her leg is treated daily with washing and dressing, but she does not attend a wound clinic for this.    Reviewed and updated this visit by provider: Tobacco   Allergies  Meds  Problems  Med Hx  Surg Hx  Fam Hx        Review of Systems  Constitutional:  Positive for fatigue. Negative for chills and fever.  HENT: Negative.    Eyes: Negative.   Respiratory:  Negative for cough, chest tightness and shortness of breath.   Cardiovascular:  Negative for chest pain and leg swelling.  Gastrointestinal:  Negative for abdominal pain, constipation, diarrhea, nausea and vomiting.  Endocrine: Negative.   Genitourinary: Negative.   Musculoskeletal:  Positive for arthralgias.  Skin:  Positive for wound.       Non-healing wound  Neurological:  Negative for dizziness, seizures and headaches.  Hematological: Negative.   Psychiatric/Behavioral: Negative.       Objective   Vitals:   09/24/24 1053  BP: (!) 147/58  Pulse: 65  Temp: 97.7 F (36.5 C)  TempSrc: Temporal  Resp: 18  Height: 5' 1 (1.549 m)  Weight: 131 lb (59.4 kg)  SpO2: 98%  BMI (Calculated): 24.8  PainSc:   9  PainLoc: Generalized     Physical Exam Vitals reviewed.  Constitutional:      General: She is not in acute distress. HENT:     Head: Normocephalic.  Eyes:     General: No scleral icterus.    Conjunctiva/sclera: Conjunctivae normal.  Cardiovascular:     Rate and Rhythm: Normal rate and regular rhythm.     Heart sounds: Murmur heard.  Pulmonary:     Effort: Pulmonary effort is normal. No respiratory distress.  Breath sounds: Normal breath sounds.  Abdominal:     General: Bowel sounds are normal.     Palpations: Abdomen is soft.     Tenderness: There is no abdominal tenderness.  Skin:    General: Skin is warm and dry.  Neurological:     Mental Status: She is alert and oriented to person, place, and time.  Psychiatric:        Mood and Affect: Mood normal.        Behavior: Behavior normal.        Thought Content: Thought content normal.        Judgment: Judgment normal.      Results Labs  - Complete Blood Count (CBC): 09/24/2024, WBC: 8.6,  Hemoglobin: 11.4, MCV: 89.8, Platelet count: 299,000  - Iron Studies: 05/28/2024, TIBC: 224, Iron: 51%, Saturation: 23%, Ferritin: 327  - Vitamin B12: 05/28/2024, 49  - Folate Level: 05/28/2024, Greater than 20  Recent Results (from the past 72 hours)  CBC And Differential   Collection Time: 09/24/24 10:48 AM  Result Value Ref Range   WBC 8.6 3.7 - 11.0 thou/mcL   RBC 4.02 4.01 - 4.90 million/mcL   HGB 11.4 (L) 12.2 - 14.9 gm/dL   HCT 63.8 64.1 - 52.0 %   MCV 89.8 82.0 - 98.0 fL   MCH 28.4 27.0 - 33.0 pg   MCHC 31.6 31.0 - 37.0 gm/dL   Plt Ct 700 849 - 599 thou/mcL   RDW SD 46.1 36.0 - 47.0 fL   RDW CV 13.8 11.8 - 14.9 %   NEUTROPHIL % 56.2 %   LYMPHOCYTE % 23.7 %   MONOCYTE % 11.5 %   Eosinophil % 7.6 %   BASOPHIL % 0.9 %   ABSOLUTE NEUTROPHIL COUNT 4.82 1.50 - 7.50 thou/mcL   ABSOLUTE LYMPHOCYTE COUNT 2.03 1.00 - 4.50 thou/mcL   Absolute Monocyte Count 0.99 (H) 0.10 - 0.80 thou/mcL   Absolute Eosinophil Count 0.65 (H) 0.00 - 0.50 thou/mcL   Absolute Basophil Count 0.08 0.00 - 0.20 thou/mcL      No results found for this or any previous visit (from the past 72 hours).     Kristin R Curcio, NP seen for and under the supervision of Dr. Romana Quay.  09/24/2024, 10:59 AM   Computer technology was used to create visit note. Consent from the patient/caregiver was obtained prior to its use.

## 2024-10-01 ENCOUNTER — Encounter: Payer: Self-pay | Admitting: Radiology

## 2024-10-04 ENCOUNTER — Other Ambulatory Visit: Payer: Self-pay | Admitting: Family

## 2024-10-08 ENCOUNTER — Encounter: Payer: Self-pay | Admitting: Infectious Disease

## 2024-10-08 ENCOUNTER — Encounter: Payer: Self-pay | Admitting: Orthopedic Surgery

## 2024-10-09 ENCOUNTER — Ambulatory Visit: Admitting: Orthopedic Surgery

## 2024-10-11 ENCOUNTER — Ambulatory Visit: Admitting: Physician Assistant

## 2024-10-11 DIAGNOSIS — M79671 Pain in right foot: Secondary | ICD-10-CM

## 2024-10-11 DIAGNOSIS — G8929 Other chronic pain: Secondary | ICD-10-CM

## 2024-10-11 DIAGNOSIS — L97914 Non-pressure chronic ulcer of unspecified part of right lower leg with necrosis of bone: Secondary | ICD-10-CM | POA: Diagnosis not present

## 2024-10-11 DIAGNOSIS — I890 Lymphedema, not elsewhere classified: Secondary | ICD-10-CM | POA: Diagnosis not present

## 2024-10-11 NOTE — Progress Notes (Unsigned)
 Office Visit Note   Patient: Isabel Barnes           Date of Birth: [birth year]           MRN: [id number] Visit Date: 10/11/2024              Requested by: Hartwell Area, PA-C [address] [place],  [place] [address] PCP: Hartwell Area, PA-C  No chief complaint on file.     HPI: Isabel Barnes is an 83 year old female with rheumatoid and osteoarthritis who presents with stable lymphedema in both lower extremities.  She has a chronic wound on right lower extremity, 7x13 cm, with healthy granulation tissue on her last visit. Heel ulcer is stable.   Today they are here with concerns of the heel increased pain.  There is a small amount of clear fluid leaking.  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  Her primary physician started her on Augmentin .  She denies fever and chills.    Assessment & Plan: Visit Diagnoses: No diagnosis found.  Plan: ***  Follow-Up Instructions: No follow-ups on file.   Ortho Exam  Patient is alert, oriented, no adenopathy, well-dressed, normal affect, normal respiratory effort. Right heel is tender to palpation , no frank cellulitis or active drainage.  No gross edema.  Small 3mm circle of clear fluid drainage on hell donut pad.  No abcess    Imaging: No results found. No images are attached to the encounter.  Labs: Lab Results  Component Value Date   ESRSEDRATE 6 01/16/2024   ESRSEDRATE 6 12/19/2023   ESRSEDRATE 40 (H) 02/07/2022   CRP 42.4 (H) 04/06/2024   CRP <3.0 01/16/2024   CRP 7.6 12/19/2023   REPTSTATUS 09/12/2023 FINAL 09/07/2023   GRAMSTAIN  02/10/2022    RARE WBC PRESENT, PREDOMINANTLY PMN NO ORGANISMS SEEN    CULT  09/07/2023    NO GROWTH 5 DAYS Performed at [facility], [address] [place], [place] [address]    [facility] ENTEROCOCCUS FAECALIS 02/10/2022     Lab Results  Component Value Date   ALBUMIN  3.5 09/07/2023   ALBUMIN  1.5 (L) 02/09/2022   ALBUMIN  2.1 (L) 02/06/2022     Lab Results  Component Value Date   MG 2.0 02/12/2022   MG 1.8 02/07/2022   Lab Results  Component Value Date   VD25OH 69.88 02/07/2022    No results found for: PREALBUMIN    Latest Ref Rng & Units 04/06/2024   11:30 AM 01/16/2024    1:15 PM 12/28/2023   10:31 AM  CBC EXTENDED  WBC 3.8 - 10.8 Thousand/uL 6.5  7.9  5.0   RBC 3.80 - 5.10 Million/uL 3.52  4.08  3.81   Hemoglobin 11.7 - 15.5 g/dL 89.9  88.6  89.3   HCT 35.0 - 45.0 % 30.7  35.1  32.8   Platelets 140 - 400 Thousand/uL 277  371  255   NEUT# 1,500 - 7,800 cells/uL  4,013  2,610      There is no height or weight on file to calculate BMI.  Orders:  No orders of the defined types were placed in this encounter.  No orders of the defined types were placed in this encounter.    Procedures: No procedures performed  Clinical Data: No additional findings.  ROS:  All other systems negative, except as noted in the HPI. Review of Systems  Objective: Vital Signs: There were no vitals taken for this visit.  Specialty Comments:  No specialty comments available.  PMFS History: Patient Active Problem List   Diagnosis Date Noted  . 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 Venous ulcer of right leg (HCC) 04/16/2024  . Medication management 04/16/2024  . Fungal infection 04/16/2024  . Rash 01/15/2024  . Rheumatoid arthritis (HCC) 12/19/2023  . Acute osteomyelitis of right calcaneus (HCC) 12/18/2023  . 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  . RLE Wound  R Heel Ulcer 09/08/2023  . LLE Cellulitis 09/07/2023  . Abscess of right lower leg   . Severe protein-calorie malnutrition   . Peripheral arterial disease   . Fall   . Atrial fibrillation (HCC)   . Goals of care, counseling/discussion   . Anemia   . Fluid collection (edema) in the arms, legs, hands and feet   . Cellulitis of right lower extremity 02/06/2022  . Chest pain, rule out acute myocardial infarction 03/16/2021  .  Hypertension 03/16/2021  . Hypothyroidism 03/16/2021  . Hyperlipemia 03/16/2021  . Aortic valve stenosis 03/16/2021  . 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 Short-term memory loss 12/13/2019  . 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 Chronic pain disorder   . Osteoarthritis   . Incisional umbilical hernia, without obstruction or gangrene   . Iron deficiency anemia 09/19/2012   Past Medical History:  Diagnosis Date  . Acute osteomyelitis of right calcaneus (HCC) 12/18/2023  . AKI (acute kidney injury) 09/08/2023  . Anemia    iron deficiency hx.has had iron infusions before   . Chronic low back pain   . Chronic pain disorder   . Complication of anesthesia    severe claustrophobia  . Constipation    r/t use of pain meds.Takes OTC meds or eats prunes  . 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 Depression   . GERD (gastroesophageal reflux disease)    takes Omeprazole daily  . Heart murmur    mild MS, moderate-severe AS 03/17/21 echo  . History of bronchitis    20+ yrs ago  . History of kidney stones    3 surgerical removed, 1 passed  . History of prolapse of bladder   . 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 History of shingles   . Hypertension    takes Losartan  daily  . Hypothyroidism    takes Synthroid  daily  . Joint swelling   . Neck pain    bone spurs at base of head per pt  . Osteoarthritis    lumbar,cervical,joints  . Pneumonia    hx of > 20 yrs ago  . Rash 01/15/2024  . Rheumatoid arthritis (HCC) 12/19/2023  . Shortness of breath    occasionally and with exertion. Albuterol  inhaler as needed  . Spinal headache 1991   blood patch placed  . Spondylitis   . Unsteady gait    occasionally  . Urinary urgency     Family History  Problem Relation Age of Onset  . Stroke Father      Past Surgical History:  Procedure Laterality Date  . ABDOMINAL AORTOGRAM W/LOWER EXTREMITY N/A 02/15/2022   Procedure: ABDOMINAL AORTOGRAM W/LOWER EXTREMITY;  Surgeon: Sheree Penne Bruckner, MD;  Location: [facility] INVASIVE CV LAB;  Service: Cardiovascular;  Laterality: N/A;  . ABDOMINAL AORTOGRAM W/LOWER EXTREMITY N/A 09/12/2023   Procedure: ABDOMINAL AORTOGRAM W/LOWER EXTREMITY;  Surgeon:  Lanis Fonda BRAVO, MD;  Location: [facility] INVASIVE CV LAB;  Service: Cardiovascular;  Laterality: N/A;  . ABDOMINAL HYSTERECTOMY    . ANTERIOR FUSION CERVICAL SPINE     x2 -C4-7  . APPLICATION OF WOUND VAC Right 03/12/2022   Procedure: APPLICATION OF WOUND VAC;  Surgeon: Harden Jerona GAILS, MD;  Location: [facility] OR;  Service: Orthopedics;  Laterality: Right;  . BUNIONECTOMY Bilateral   . COLONOSCOPY    . CYSTOSCOPY W/ URETEROSCOPY  2012  . EYE SURGERY Bilateral    cataract /lens implant  . HOLMIUM LASER APPLICATION Left 02/08/2013   Procedure: HOLMIUM LASER APPLICATION;  Surgeon: Norleen JINNY Seltzer, MD;  Location: [facility];  Service: Urology;  Laterality: Left;  . I & D EXTREMITY Right 02/10/2022   Procedure: DEBRIDEMENT RIGHT LEG ABSCESS;  Surgeon: Harden Jerona GAILS, MD;  Location: [facility] OR;  Service: Orthopedics;  Laterality: Right;  . I & D EXTREMITY Right 03/12/2022   Procedure: RIGHT LEG IRRIGATION AND DEBRIDEMENT EXTREMITY;  Surgeon: Harden Jerona GAILS, MD;  Location: [facility] OR;  Service: Orthopedics;  Laterality: Right;  . INSERTION OF MESH N/A 07/15/2014   Procedure: INSERTION OF MESH;  Surgeon: Elspeth KYM Schultze, MD;  Location: [facility] OR;  Service: General;  Laterality: N/A;  . JOINT REPLACEMENT Right 2012   shoulder  . LAPAROSCOPIC CHOLECYSTECTOMY W/ CHOLANGIOGRAPHY  2012   Dr Vernetta  . NASAL SINUS SURGERY    . OPEN REDUCTION INTERNAL FIXATION (ORIF) DISTAL RADIAL FRACTURE Left 03/20/2021   Procedure: OPEN REDUCTION INTERNAL FIXATION (ORIF) DISTAL RADIAL FRACTURE;  Surgeon: Josefina Fonda, MD;  Location: [facility] OR;   Service: Orthopedics;  Laterality: Left;  . RADIOLOGY WITH ANESTHESIA N/A 05/09/2014   Procedure: ADULT SEDATION WITH ANESTHESIA/MRI CERVICAL SPINE WITHOUT CONTRAST;  Surgeon: Medication Radiologist, MD;  Location: [facility] OR;  Service: Radiology;  Laterality: N/A;  DR. HAWKS/MRI  . right knee arthroscopy     d/t meniscal tear  . SHOULDER ARTHROSCOPY W/ ROTATOR CUFF REPAIR Bilateral three times each over several yrs  . SKIN FULL THICKNESS GRAFT Right 03/12/2022   Procedure: SKIN GRAFT FULL THICKNESS;  Surgeon: Harden Jerona GAILS, MD;  Location: [facility] OR;  Service: Orthopedics;  Laterality: Right;  . SKIN SPLIT GRAFT Right 02/17/2022   Procedure: RIGHT LEG SKIN GRAFT;  Surgeon: Harden Jerona GAILS, MD;  Location: [facility] OR;  Service: Orthopedics;  Laterality: Right;  . THUMB ARTHROSCOPY Left   . 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   Procedure: RIGHT TOTAL KNEE ARTHROPLASTY;  Surgeon: Norleen Gavel, MD;  Location: [facility] OR;  Service: Orthopedics;  Laterality: Right;  . UMBILICAL HERNIA REPAIR N/A 07/15/2014   Procedure: LAPAROSCOPIC UMBILICAL AND INFRAUMBILICAL HERNIA;  Surgeon: Elspeth KYM Schultze, MD;  Location: [facility] OR;  Service: General;  Laterality: N/A;   Social History   Occupational History  . Not on file  Tobacco Use  . Smoking status: Former    Current packs/day: 0.00    Average packs/day: 0.5 packs/day for 2.0 years (1.0 ttl pk-yrs)    Types: Cigarettes    Start date: 02/06/1990    Quit date: 02/07/1992    Years since quitting: 32.6  . Smokeless tobacco: Never  Vaping Use  . Vaping status: Never Used  Substance and Sexual Activity  . Alcohol  use: No  . Drug use: No  . Sexual activity: Not on file

## 2024-10-12 ENCOUNTER — Encounter: Payer: Self-pay | Admitting: Physician Assistant

## 2024-10-17 ENCOUNTER — Inpatient Hospital Stay (HOSPITAL_COMMUNITY)

## 2024-10-17 ENCOUNTER — Emergency Department (HOSPITAL_COMMUNITY)

## 2024-10-17 ENCOUNTER — Inpatient Hospital Stay (HOSPITAL_COMMUNITY)
Admission: EM | Admit: 2024-10-17 | Discharge: 2024-10-23 | DRG: 871 | Disposition: A | Discharge status: Home Health | Attending: Internal Medicine | Admitting: Internal Medicine

## 2024-10-17 ENCOUNTER — Other Ambulatory Visit: Payer: Self-pay

## 2024-10-17 ENCOUNTER — Encounter (HOSPITAL_COMMUNITY): Payer: Self-pay

## 2024-10-17 DIAGNOSIS — M86679 Other chronic osteomyelitis, unspecified ankle and foot: Secondary | ICD-10-CM | POA: Diagnosis not present

## 2024-10-17 DIAGNOSIS — I110 Hypertensive heart disease with heart failure: Secondary | ICD-10-CM | POA: Diagnosis present

## 2024-10-17 DIAGNOSIS — E039 Hypothyroidism, unspecified: Secondary | ICD-10-CM | POA: Diagnosis present

## 2024-10-17 DIAGNOSIS — Z91040 Latex allergy status: Secondary | ICD-10-CM

## 2024-10-17 DIAGNOSIS — Z8673 Personal history of transient ischemic attack (TIA), and cerebral infarction without residual deficits: Secondary | ICD-10-CM

## 2024-10-17 DIAGNOSIS — Z883 Allergy status to other anti-infective agents status: Secondary | ICD-10-CM

## 2024-10-17 DIAGNOSIS — Z91048 Other nonmedicinal substance allergy status: Secondary | ICD-10-CM

## 2024-10-17 DIAGNOSIS — I2510 Atherosclerotic heart disease of native coronary artery without angina pectoris: Secondary | ICD-10-CM | POA: Diagnosis present

## 2024-10-17 DIAGNOSIS — I739 Peripheral vascular disease, unspecified: Secondary | ICD-10-CM | POA: Diagnosis present

## 2024-10-17 DIAGNOSIS — R7989 Other specified abnormal findings of blood chemistry: Secondary | ICD-10-CM | POA: Diagnosis not present

## 2024-10-17 DIAGNOSIS — L97918 Non-pressure chronic ulcer of unspecified part of right lower leg with other specified severity: Secondary | ICD-10-CM | POA: Diagnosis not present

## 2024-10-17 DIAGNOSIS — I872 Venous insufficiency (chronic) (peripheral): Secondary | ICD-10-CM | POA: Diagnosis present

## 2024-10-17 DIAGNOSIS — J9601 Acute respiratory failure with hypoxia: Secondary | ICD-10-CM | POA: Diagnosis present

## 2024-10-17 DIAGNOSIS — Z66 Do not resuscitate: Secondary | ICD-10-CM | POA: Diagnosis present

## 2024-10-17 DIAGNOSIS — I081 Rheumatic disorders of both mitral and tricuspid valves: Secondary | ICD-10-CM | POA: Diagnosis present

## 2024-10-17 DIAGNOSIS — Z604 Social exclusion and rejection: Secondary | ICD-10-CM | POA: Diagnosis present

## 2024-10-17 DIAGNOSIS — K7200 Acute and subacute hepatic failure without coma: Secondary | ICD-10-CM | POA: Diagnosis present

## 2024-10-17 DIAGNOSIS — I214 Non-ST elevation (NSTEMI) myocardial infarction: Secondary | ICD-10-CM | POA: Diagnosis present

## 2024-10-17 DIAGNOSIS — Z888 Allergy status to other drugs, medicaments and biological substances status: Secondary | ICD-10-CM

## 2024-10-17 DIAGNOSIS — I350 Nonrheumatic aortic (valve) stenosis: Secondary | ICD-10-CM | POA: Diagnosis present

## 2024-10-17 DIAGNOSIS — A419 Sepsis, unspecified organism: Principal | ICD-10-CM | POA: Diagnosis present

## 2024-10-17 DIAGNOSIS — G894 Chronic pain syndrome: Secondary | ICD-10-CM | POA: Diagnosis present

## 2024-10-17 DIAGNOSIS — R5381 Other malaise: Secondary | ICD-10-CM | POA: Diagnosis present

## 2024-10-17 DIAGNOSIS — D638 Anemia in other chronic diseases classified elsewhere: Secondary | ICD-10-CM | POA: Diagnosis present

## 2024-10-17 DIAGNOSIS — K219 Gastro-esophageal reflux disease without esophagitis: Secondary | ICD-10-CM | POA: Diagnosis present

## 2024-10-17 DIAGNOSIS — Z79891 Long term (current) use of opiate analgesic: Secondary | ICD-10-CM

## 2024-10-17 DIAGNOSIS — F05 Delirium due to known physiological condition: Secondary | ICD-10-CM | POA: Diagnosis not present

## 2024-10-17 DIAGNOSIS — Z885 Allergy status to narcotic agent status: Secondary | ICD-10-CM

## 2024-10-17 DIAGNOSIS — Z8619 Personal history of other infectious and parasitic diseases: Secondary | ICD-10-CM

## 2024-10-17 DIAGNOSIS — M069 Rheumatoid arthritis, unspecified: Secondary | ICD-10-CM | POA: Diagnosis present

## 2024-10-17 DIAGNOSIS — W19XXXA Unspecified fall, initial encounter: Secondary | ICD-10-CM | POA: Diagnosis present

## 2024-10-17 DIAGNOSIS — M86671 Other chronic osteomyelitis, right ankle and foot: Secondary | ICD-10-CM | POA: Diagnosis present

## 2024-10-17 DIAGNOSIS — Z515 Encounter for palliative care: Secondary | ICD-10-CM

## 2024-10-17 DIAGNOSIS — Z881 Allergy status to other antibiotic agents status: Secondary | ICD-10-CM

## 2024-10-17 DIAGNOSIS — R6520 Severe sepsis without septic shock: Secondary | ICD-10-CM | POA: Diagnosis present

## 2024-10-17 DIAGNOSIS — I5033 Acute on chronic diastolic (congestive) heart failure: Secondary | ICD-10-CM | POA: Diagnosis present

## 2024-10-17 DIAGNOSIS — Z823 Family history of stroke: Secondary | ICD-10-CM

## 2024-10-17 DIAGNOSIS — L03115 Cellulitis of right lower limb: Secondary | ICD-10-CM | POA: Diagnosis present

## 2024-10-17 DIAGNOSIS — Z602 Problems related to living alone: Secondary | ICD-10-CM | POA: Diagnosis present

## 2024-10-17 DIAGNOSIS — E785 Hyperlipidemia, unspecified: Secondary | ICD-10-CM | POA: Diagnosis present

## 2024-10-17 DIAGNOSIS — F40240 Claustrophobia: Secondary | ICD-10-CM | POA: Diagnosis present

## 2024-10-17 DIAGNOSIS — R296 Repeated falls: Secondary | ICD-10-CM | POA: Diagnosis present

## 2024-10-17 DIAGNOSIS — Z87891 Personal history of nicotine dependence: Secondary | ICD-10-CM

## 2024-10-17 DIAGNOSIS — L0390 Cellulitis, unspecified: Secondary | ICD-10-CM | POA: Diagnosis not present

## 2024-10-17 DIAGNOSIS — Z87442 Personal history of urinary calculi: Secondary | ICD-10-CM

## 2024-10-17 DIAGNOSIS — Z7189 Other specified counseling: Secondary | ICD-10-CM | POA: Diagnosis not present

## 2024-10-17 DIAGNOSIS — Z981 Arthrodesis status: Secondary | ICD-10-CM

## 2024-10-17 DIAGNOSIS — L97419 Non-pressure chronic ulcer of right heel and midfoot with unspecified severity: Secondary | ICD-10-CM | POA: Diagnosis not present

## 2024-10-17 DIAGNOSIS — M542 Cervicalgia: Secondary | ICD-10-CM | POA: Diagnosis not present

## 2024-10-17 DIAGNOSIS — E872 Acidosis, unspecified: Secondary | ICD-10-CM | POA: Diagnosis present

## 2024-10-17 DIAGNOSIS — Z882 Allergy status to sulfonamides status: Secondary | ICD-10-CM

## 2024-10-17 DIAGNOSIS — Z96651 Presence of right artificial knee joint: Secondary | ICD-10-CM | POA: Diagnosis present

## 2024-10-17 DIAGNOSIS — L89613 Pressure ulcer of right heel, stage 3: Secondary | ICD-10-CM | POA: Diagnosis present

## 2024-10-17 DIAGNOSIS — Z79890 Hormone replacement therapy (postmenopausal): Secondary | ICD-10-CM

## 2024-10-17 DIAGNOSIS — I890 Lymphedema, not elsewhere classified: Secondary | ICD-10-CM | POA: Diagnosis present

## 2024-10-17 DIAGNOSIS — Z7952 Long term (current) use of systemic steroids: Secondary | ICD-10-CM

## 2024-10-17 DIAGNOSIS — Z91013 Allergy to seafood: Secondary | ICD-10-CM

## 2024-10-17 DIAGNOSIS — Y92009 Unspecified place in unspecified non-institutional (private) residence as the place of occurrence of the external cause: Secondary | ICD-10-CM | POA: Diagnosis not present

## 2024-10-17 DIAGNOSIS — Z7982 Long term (current) use of aspirin: Secondary | ICD-10-CM

## 2024-10-17 DIAGNOSIS — Z91041 Radiographic dye allergy status: Secondary | ICD-10-CM

## 2024-10-17 DIAGNOSIS — Z90710 Acquired absence of both cervix and uterus: Secondary | ICD-10-CM

## 2024-10-17 DIAGNOSIS — Z79899 Other long term (current) drug therapy: Secondary | ICD-10-CM

## 2024-10-17 LAB — CBC WITH DIFFERENTIAL/PLATELET
Abs Immature Granulocytes: 0.2 K/uL — ABNORMAL HIGH (ref 0.00–0.07)
Basophils Absolute: 0.1 K/uL (ref 0.0–0.1)
Basophils Relative: 0 %
Eosinophils Absolute: 0.1 K/uL (ref 0.0–0.5)
Eosinophils Relative: 0 %
HCT: 36.4 % (ref 36.0–46.0)
Hemoglobin: 11.6 g/dL — ABNORMAL LOW (ref 12.0–15.0)
Immature Granulocytes: 1 %
Lymphocytes Relative: 2 %
Lymphs Abs: 0.4 K/uL — ABNORMAL LOW (ref 0.7–4.0)
MCH: 28.2 pg (ref 26.0–34.0)
MCHC: 31.9 g/dL (ref 30.0–36.0)
MCV: 88.3 fL (ref 80.0–100.0)
Monocytes Absolute: 0.8 K/uL (ref 0.1–1.0)
Monocytes Relative: 3 %
Neutro Abs: 22.6 K/uL — ABNORMAL HIGH (ref 1.7–7.7)
Neutrophils Relative %: 94 %
Platelets: 308 K/uL (ref 150–400)
RBC: 4.12 MIL/uL (ref 3.87–5.11)
RDW: 14.1 % (ref 11.5–15.5)
WBC: 24.2 K/uL — ABNORMAL HIGH (ref 4.0–10.5)
nRBC: 0 % (ref 0.0–0.2)

## 2024-10-17 LAB — RESP PANEL BY RT-PCR (RSV, FLU A&B, COVID)  RVPGX2
Influenza A by PCR: NEGATIVE
Influenza B by PCR: NEGATIVE
Resp Syncytial Virus by PCR: NEGATIVE
SARS Coronavirus 2 by RT PCR: NEGATIVE

## 2024-10-17 LAB — RESPIRATORY PANEL BY PCR

## 2024-10-17 LAB — URINALYSIS, W/ REFLEX TO CULTURE (INFECTION SUSPECTED)
Bacteria, UA: NONE SEEN
Bilirubin Urine: NEGATIVE
Glucose, UA: NEGATIVE mg/dL
Ketones, ur: NEGATIVE mg/dL
Leukocytes,Ua: NEGATIVE
Nitrite: NEGATIVE
Protein, ur: 100 mg/dL — AB
Specific Gravity, Urine: 1.017 (ref 1.005–1.030)
pH: 7 (ref 5.0–8.0)

## 2024-10-17 LAB — COMPREHENSIVE METABOLIC PANEL WITH GFR
ALT: 113 U/L — ABNORMAL HIGH (ref 0–44)
AST: 92 U/L — ABNORMAL HIGH (ref 15–41)
Albumin: 3.4 g/dL — ABNORMAL LOW (ref 3.5–5.0)
Alkaline Phosphatase: 138 U/L — ABNORMAL HIGH (ref 38–126)
Anion gap: 14 (ref 5–15)
BUN: 15 mg/dL (ref 8–23)
CO2: 22 mmol/L (ref 22–32)
Calcium: 8.7 mg/dL — ABNORMAL LOW (ref 8.9–10.3)
Chloride: 101 mmol/L (ref 98–111)
Creatinine, Ser: 0.96 mg/dL (ref 0.44–1.00)
GFR, Estimated: 58 mL/min — ABNORMAL LOW (ref >60)
Glucose, Bld: 129 mg/dL — ABNORMAL HIGH (ref 70–99)
Potassium: 3.9 mmol/L (ref 3.5–5.1)
Sodium: 137 mmol/L (ref 135–145)
Total Bilirubin: 1.1 mg/dL (ref 0.0–1.2)
Total Protein: 6.3 g/dL — ABNORMAL LOW (ref 6.5–8.1)

## 2024-10-17 LAB — PROTIME-INR
INR: 1 (ref 0.8–1.2)
Prothrombin Time: 14.2 s (ref 11.4–15.2)

## 2024-10-17 LAB — I-STAT CG4 LACTIC ACID, ED
Lactic Acid, Venous: 3.5 mmol/L (ref 0.5–1.9)
Lactic Acid, Venous: 4 mmol/L (ref 0.5–1.9)

## 2024-10-17 LAB — CBC
HCT: 37.2 % (ref 36.0–46.0)
Hemoglobin: 11.7 g/dL — ABNORMAL LOW (ref 12.0–15.0)
MCH: 27.8 pg (ref 26.0–34.0)
MCHC: 31.5 g/dL (ref 30.0–36.0)
MCV: 88.4 fL (ref 80.0–100.0)
Platelets: 289 K/uL (ref 150–400)
RBC: 4.21 MIL/uL (ref 3.87–5.11)
RDW: 14.2 % (ref 11.5–15.5)
WBC: 20.1 K/uL — ABNORMAL HIGH (ref 4.0–10.5)
nRBC: 0 % (ref 0.0–0.2)

## 2024-10-17 LAB — CREATININE, SERUM
Creatinine, Ser: 0.93 mg/dL (ref 0.44–1.00)
GFR, Estimated: >60 mL/min (ref >60)

## 2024-10-17 LAB — CK: Total CK: 346 U/L — ABNORMAL HIGH (ref 38–234)

## 2024-10-17 LAB — TROPONIN I (HIGH SENSITIVITY): Troponin I (High Sensitivity): 4245 ng/L (ref <18)

## 2024-10-17 MED ORDER — LACTATED RINGERS IV BOLUS
1000.0000 mL | Freq: Once | INTRAVENOUS | Status: AC
  Administered 2024-10-17: 1000 mL via INTRAVENOUS

## 2024-10-17 MED ORDER — ASPIRIN 325 MG PO TABS
325.0000 mg | ORAL_TABLET | Freq: Once | ORAL | Status: AC
  Administered 2024-10-17: 325 mg via ORAL
  Filled 2024-10-17: qty 1

## 2024-10-17 MED ORDER — FLUCONAZOLE 150 MG PO TABS: 150.0000 mg | ORAL_TABLET | ORAL | Status: DC

## 2024-10-17 MED ORDER — TRIAMCINOLONE ACETONIDE 55 MCG/ACT NA AERO: 1.0000 | INHALATION_SPRAY | Freq: Two times a day (BID) | NASAL | Status: DC | PRN

## 2024-10-17 MED ORDER — PIPERACILLIN-TAZOBACTAM 3.375 G IVPB
3.3750 g | Freq: Three times a day (TID) | INTRAVENOUS | Status: DC
  Administered 2024-10-17 – 2024-10-19 (×6): 3.375 g via INTRAVENOUS
  Filled 2024-10-17 (×6): qty 50

## 2024-10-17 MED ORDER — LORAZEPAM 2 MG/ML IJ SOLN
0.5000 mg | Freq: Once | INTRAMUSCULAR | Status: AC
  Administered 2024-10-17: 0.5 mg via INTRAVENOUS
  Filled 2024-10-17: qty 1

## 2024-10-17 MED ORDER — FUROSEMIDE 10 MG/ML IJ SOLN
20.0000 mg | Freq: Once | INTRAMUSCULAR | Status: DC
  Filled 2024-10-17: qty 2

## 2024-10-17 MED ORDER — ACETAMINOPHEN 650 MG RE SUPP
650.0000 mg | Freq: Four times a day (QID) | RECTAL | Status: DC | PRN
Start: 2024-10-17 — End: 2024-10-23

## 2024-10-17 MED ORDER — LACTATED RINGERS IV BOLUS (SEPSIS)
1000.0000 mL | Freq: Once | INTRAVENOUS | Status: AC
  Administered 2024-10-17: 1000 mL via INTRAVENOUS

## 2024-10-17 MED ORDER — VANCOMYCIN HCL IN DEXTROSE 1-5 GM/200ML-% IV SOLN
1000.0000 mg | Freq: Once | INTRAVENOUS | Status: AC
  Administered 2024-10-17: 1000 mg via INTRAVENOUS
  Filled 2024-10-17: qty 200

## 2024-10-17 MED ORDER — HYDROXYZINE HCL 25 MG PO TABS
25.0000 mg | ORAL_TABLET | Freq: Once | ORAL | Status: AC
  Administered 2024-10-19: 25 mg via ORAL
  Filled 2024-10-17: qty 1

## 2024-10-17 MED ORDER — ACETAMINOPHEN 325 MG PO TABS: 650.0000 mg | ORAL_TABLET | Freq: Four times a day (QID) | ORAL | Status: DC | PRN

## 2024-10-17 MED ORDER — ASPIRIN 81 MG PO TBEC
81.0000 mg | DELAYED_RELEASE_TABLET | Freq: Every day | ORAL | Status: DC
  Administered 2024-10-17 – 2024-10-23 (×7): 81 mg via ORAL
  Filled 2024-10-17 (×7): qty 1

## 2024-10-17 MED ORDER — SODIUM CHLORIDE 0.9 % IV SOLN
2.0000 g | Freq: Once | INTRAVENOUS | Status: AC
  Administered 2024-10-17: 2 g via INTRAVENOUS
  Filled 2024-10-17: qty 12.5

## 2024-10-17 MED ORDER — NITROGLYCERIN 0.4 MG SL SUBL
0.4000 mg | SUBLINGUAL_TABLET | SUBLINGUAL | Status: DC | PRN
  Administered 2024-10-17 (×2): 0.4 mg via SUBLINGUAL
  Filled 2024-10-17: qty 1

## 2024-10-17 MED ORDER — ONDANSETRON HCL 4 MG/2ML IJ SOLN
4.0000 mg | Freq: Once | INTRAMUSCULAR | Status: AC
  Administered 2024-10-17: 4 mg via INTRAVENOUS
  Filled 2024-10-17: qty 2

## 2024-10-17 MED ORDER — LINEZOLID 600 MG/300ML IV SOLN
600.0000 mg | Freq: Two times a day (BID) | INTRAVENOUS | Status: DC
  Administered 2024-10-17 – 2024-10-19 (×4): 600 mg via INTRAVENOUS
  Filled 2024-10-17 (×5): qty 300

## 2024-10-17 MED ORDER — ONDANSETRON HCL 4 MG PO TABS
4.0000 mg | ORAL_TABLET | Freq: Four times a day (QID) | ORAL | Status: DC | PRN
Start: 2024-10-17 — End: 2024-10-20

## 2024-10-17 MED ORDER — CYCLOSPORINE 0.05 % OP EMUL
1.0000 [drp] | Freq: Every day | OPHTHALMIC | Status: DC
  Administered 2024-10-19 – 2024-10-23 (×5): 1 [drp] via OPHTHALMIC
  Filled 2024-10-17 (×7): qty 30

## 2024-10-17 MED ORDER — NAPHAZOLINE-GLYCERIN 0.012-0.25 % OP SOLN
2.0000 [drp] | Freq: Four times a day (QID) | OPHTHALMIC | Status: DC | PRN
Start: 2024-10-17 — End: 2024-10-23

## 2024-10-17 MED ORDER — METRONIDAZOLE 500 MG/100ML IV SOLN
500.0000 mg | Freq: Once | INTRAVENOUS | Status: AC
  Administered 2024-10-17: 500 mg via INTRAVENOUS
  Filled 2024-10-17: qty 100

## 2024-10-17 MED ORDER — FUROSEMIDE 10 MG/ML IJ SOLN
20.0000 mg | Freq: Once | INTRAMUSCULAR | Status: AC
  Administered 2024-10-17: 20 mg via INTRAVENOUS

## 2024-10-17 MED ORDER — HYDROCODONE-ACETAMINOPHEN 5-325 MG PO TABS
1.0000 | ORAL_TABLET | Freq: Four times a day (QID) | ORAL | Status: DC | PRN
  Administered 2024-10-17 – 2024-10-19 (×4): 1 via ORAL
  Filled 2024-10-17 (×4): qty 1

## 2024-10-17 MED ORDER — VANCOMYCIN HCL 250 MG PO CAPS: 500.0000 mg | ORAL_CAPSULE | Freq: Four times a day (QID) | ORAL | Status: DC

## 2024-10-17 MED ORDER — PREDNISONE 20 MG PO TABS: 20.0000 mg | ORAL_TABLET | Freq: Every day | ORAL | Status: DC

## 2024-10-17 MED ORDER — ONDANSETRON HCL 4 MG/2ML IJ SOLN
4.0000 mg | Freq: Four times a day (QID) | INTRAMUSCULAR | Status: DC | PRN
  Administered 2024-10-20: 4 mg via INTRAVENOUS
  Filled 2024-10-17: qty 2

## 2024-10-17 MED ORDER — ACETAMINOPHEN 325 MG PO TABS
650.0000 mg | ORAL_TABLET | Freq: Once | ORAL | Status: AC
  Administered 2024-10-17: 650 mg via ORAL
  Filled 2024-10-17: qty 2

## 2024-10-17 MED ORDER — HYDROCORTISONE SOD SUC (PF) 100 MG IJ SOLR
100.0000 mg | Freq: Three times a day (TID) | INTRAMUSCULAR | Status: DC
  Administered 2024-10-17 – 2024-10-18 (×4): 100 mg via INTRAVENOUS
  Filled 2024-10-17 (×5): qty 2

## 2024-10-17 MED ORDER — LACTATED RINGERS IV SOLN: INTRAVENOUS | Status: DC

## 2024-10-17 MED ORDER — HEPARIN SODIUM (PORCINE) 5000 UNIT/ML IJ SOLN: 5000.0000 [IU] | Freq: Three times a day (TID) | INTRAMUSCULAR | Status: DC

## 2024-10-17 NOTE — H&P (Signed)
 TRH H&P   Patient Demographics:    Isabel Barnes, is a 83 y.o. female  MRN: [id number]   DOB - March 24, 1941  Admit Date - 10/17/2024  Outpatient Primary MD for the patient is [facility], PA-C  Patient coming from: Home  Chief Complaint  Patient presents with   Fall      HPI:    Isabel Barnes  is a 83 y.o. 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 Lindia ID, chronic exertional shortness of breath, unsteady gait, chronic back pain, history of CVA in the past, who lives at home by herself presents to the hospital with a few days of generalized weakness, worsening pain in her right lower extremity with some swelling and redness, 2 falls 1 yesterday and today.  Low-grade fevers.  She called EMS after a fall today was brought to the ER where she was found to be septic source was thought to be UTI however after further investigation and looks like patient's UA is rather unremarkable but the source appears to be right lower extremity cellulitis, her only subjective complaints are generalized weakness, fevers and right lower extremity redness and pain.  Initially there were reports that she had some diarrhea however she now says she had only 1 loose stool in the last 2 days, no nausea vomiting.  Other than above no other subjective complaints.  I was requested to admit the patient for sepsis workup.    Review of systems:    A full  10 point Review of Systems was done, except as stated above, all other Review of Systems were negative.   With Past History of the following :    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   AKI (acute kidney injury) 09/08/2023   Anemia    iron deficiency hx.has had iron infusions before    Chronic low back pain    Chronic pain disorder    Complication of anesthesia    severe claustrophobia   Constipation  r/t use of pain meds.Takes OTC meds or eats prunes   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   Depression    GERD (gastroesophageal reflux disease)    takes Omeprazole daily   Heart murmur    mild MS, moderate-severe AS 03/17/21 echo   History of bronchitis    20+ yrs ago   History of kidney stones    3 surgerical removed, 1 passed   History of prolapse of bladder    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 Albuterol  inhaler as needed   Spinal headache 1991   blood patch placed   Spondylitis    Unsteady gait    occasionally   Urinary urgency       Past Surgical History:  Procedure Laterality Date   ABDOMINAL AORTOGRAM W/LOWER EXTREMITY N/A 02/15/2022   Procedure: ABDOMINAL AORTOGRAM W/LOWER EXTREMITY;  Surgeon: Sheree Penne Bruckner, MD;  Location: [facility] INVASIVE CV LAB;  Service: Cardiovascular;  Laterality: N/A;   ABDOMINAL AORTOGRAM W/LOWER EXTREMITY N/A 09/12/2023   Procedure: ABDOMINAL AORTOGRAM W/LOWER EXTREMITY;  Surgeon: Lanis Fonda BRAVO, MD;  Location: [facility]  INVASIVE CV LAB;  Service: Cardiovascular;  Laterality: N/A;   ABDOMINAL HYSTERECTOMY     ANTERIOR FUSION CERVICAL SPINE     x2 -C4-7   APPLICATION OF WOUND VAC Right 03/12/2022   Procedure: APPLICATION OF WOUND VAC;  Surgeon: Harden Jerona GAILS, MD;  Location: [facility] OR;  Service: Orthopedics;  Laterality: Right;   BUNIONECTOMY Bilateral    COLONOSCOPY     CYSTOSCOPY W/ URETEROSCOPY  2012   EYE SURGERY Bilateral    cataract /lens implant   HOLMIUM LASER APPLICATION Left 02/08/2013   Procedure: HOLMIUM LASER APPLICATION;  Surgeon: Norleen JINNY Seltzer, MD;  Location: [facility];  Service: Urology;  Laterality: Left;   I & D EXTREMITY Right 02/10/2022   Procedure: DEBRIDEMENT RIGHT LEG ABSCESS;  Surgeon: Harden Jerona GAILS, MD;  Location: [facility] OR;  Service: Orthopedics;  Laterality: Right;   I & D EXTREMITY Right 03/12/2022   Procedure: RIGHT LEG IRRIGATION AND DEBRIDEMENT EXTREMITY;  Surgeon: Harden Jerona GAILS, MD;  Location: [facility] OR;  Service: Orthopedics;  Laterality: Right;   INSERTION OF MESH N/A 07/15/2014   Procedure: INSERTION OF MESH;  Surgeon: Elspeth KYM Schultze, MD;  Location: [facility] OR;  Service: General;  Laterality: N/A;   JOINT REPLACEMENT Right 2012   shoulder   LAPAROSCOPIC CHOLECYSTECTOMY W/ CHOLANGIOGRAPHY  2012   Dr Vernetta   NASAL SINUS SURGERY     OPEN REDUCTION INTERNAL FIXATION (ORIF) DISTAL RADIAL FRACTURE Left 03/20/2021   Procedure: OPEN REDUCTION INTERNAL FIXATION (ORIF) DISTAL RADIAL FRACTURE;  Surgeon: Josefina Fonda, MD;  Location: [facility] OR;  Service: Orthopedics;  Laterality: Left;   RADIOLOGY WITH ANESTHESIA N/A 05/09/2014   Procedure: ADULT SEDATION WITH ANESTHESIA/MRI CERVICAL SPINE WITHOUT CONTRAST;  Surgeon: Medication Radiologist, MD;  Location: [facility] OR;  Service: Radiology;  Laterality: N/A;  DR. HAWKS/MRI  right knee arthroscopy     d/t meniscal tear   SHOULDER ARTHROSCOPY W/ ROTATOR CUFF REPAIR Bilateral three times each over several yrs   SKIN FULL THICKNESS GRAFT Right  03/12/2022   Procedure: SKIN GRAFT FULL THICKNESS;  Surgeon: Harden Jerona GAILS, MD;  [facility] OR;  Service: Orthopedics;  Laterality: Right;   SKIN SPLIT GRAFT Right 02/17/2022   Procedure: RIGHT LEG SKIN GRAFT;  Surgeon: Harden Jerona GAILS, MD;  Location: [facility] OR;  Service: Orthopedics;  Laterality: Right;   THUMB ARTHROSCOPY Left    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   Procedure: RIGHT TOTAL KNEE ARTHROPLASTY;  Surgeon: Norleen Gavel, MD;  Location: [facility] OR;  Service: Orthopedics;  Laterality: Right;   UMBILICAL HERNIA REPAIR N/A 07/15/2014   Procedure: LAPAROSCOPIC UMBILICAL AND INFRAUMBILICAL HERNIA;  Surgeon: Elspeth KYM Schultze, MD;  Location: [facility] OR;  Service: General;  Laterality: N/A;      Social History:     Social History   Tobacco Use   Smoking status: Former    Current packs/day: 0.00    Average packs/day: 0.5 packs/day for 2.0 years (1.0 ttl pk-yrs)    Types: Cigarettes    Start date: 02/06/1990    Quit date: 02/07/1992    Years since quitting: 32.7   Smokeless tobacco: Never  Substance Use Topics   Alcohol  use: No         Family History :     Family History  Problem Relation Age of Onset   Stroke Father        Home Medications:   Prior to Admission medications   Medication Sig Start Date End Date Taking? Authorizing Provider  acetaminophen  (TYLENOL ) 500 MG tablet Take 2 tablets (1,000 mg total) by mouth 3 (three) times daily. 02/22/22   Atway, Rayann N, DO  amoxicillin -clavulanate (AUGMENTIN ) 875-125 MG tablet Take 1 tablet by mouth 2 (two) times daily. 12/19/23   Fleeta Kathie Jomarie LOISE, MD  aspirin  EC 81 MG tablet Take 1 tablet (81 mg total) by mouth daily. Swallow whole. 09/17/23   Dameron, Marisa, DO  cholecalciferol  (VITAMIN D ) 1000 UNITS tablet Take 1,000 Units by mouth daily.      diclofenac  sodium (VOLTAREN ) 1 % GEL Apply 2 g topically daily as needed (for pain).    [provider]  Digestive Enzymes (ENZYME DIGEST) CAPS Take 1 capsule by mouth daily as  needed (For digestive). 03/23/22   Medina-Vargas, Monina C, NP  ENSURE PLUS (ENSURE PLUS) LIQD Take 237 mLs by mouth daily.    [provider]  ferrous sulfate  325 (65 FE) MG tablet Take 1 tablet (325 mg total) by mouth daily with breakfast. 03/23/22   Medina-Vargas, Monina C, NP  fish oil-omega-3 fatty acids 1000 MG capsule Take 1,000 mg by mouth 2 (two) times daily.     [provider]  fluconazole  (DIFLUCAN ) 150 MG tablet Take 1 tablet (150 mg total) by mouth once a week. 04/02/24   Harden Jerona GAILS, MD  HYDROcodone -acetaminophen  (NORCO) 10-325 MG tablet Take 1 tablet by mouth in the morning, at noon, and at bedtime.    [provider]  Lactobacillus (PROBIOTIC ACIDOPHILUS PO) Take 1 capsule by mouth daily.    [provider]  levothyroxine  (SYNTHROID ) 100 MCG tablet Take 1 tablet (100 mcg total) by mouth every morning. 09/17/23   Dameron, Marisa, DO  loperamide  (IMODIUM  A-D) 2 MG tablet Take 4 mg by mouth 3 (three) times daily as needed for diarrhea or loose stools.  [provider]  loratadine  (CLARITIN ) 10 MG tablet Take 1 tablet (10 mg total) by mouth daily. 09/16/23   Dameron, Marisa, DO  losartan  (COZAAR ) 25 MG tablet Take 1 tablet (25 mg total) by mouth daily. Patient taking differently: Take 100 mg by mouth daily. 03/23/22   Medina-Vargas, Monina C, NP  methocarbamol  (ROBAXIN ) 500 MG tablet Take 500 mg by mouth every 6 (six) hours as needed for muscle spasms.    [provider]  Multiple Vitamins-Minerals (MULTIVITAMIN PO) Take 1 tablet by mouth daily.    [provider]  ondansetron  (ZOFRAN ) 4 MG tablet Take 1 tablet (4 mg total) by mouth 2 (two) times daily. Take before each dose of augmentin  and can take again if needed up to 3 doses per day 05/17/24   Fleeta Rothman, Jomarie SAILOR, MD  polyethylene glycol (MIRALAX  / GLYCOLAX ) 17 g packet Take 17 g by mouth daily. 03/23/22   Medina-Vargas, Monina C, NP  predniSONE  (DELTASONE ) 10 MG tablet  TAKE 1 TABLET (10 MG TOTAL) BY MOUTH DAILY WITH BREAKFAST. 08/08/24   Valdemar Rocky SAUNDERS, NP  RESTASIS  0.05 % ophthalmic emulsion Place 1 drop into both eyes daily. 03/23/22   Medina-Vargas, Monina C, NP  silver  sulfADIAZINE  (SILVADENE ) 1 % cream Apply 1 Application topically 2 (two) times daily. 12/14/22   [provider]  tetrahydrozoline 0.05 % ophthalmic solution Place 1 drop into both eyes 4 (four) times daily.    [provider]  triamcinolone  (NASACORT ) 55 MCG/ACT AERO nasal inhaler Place 1-2 sprays into the nose 2 (two) times daily as needed (for seasonal allergies). 03/23/22   Medina-Vargas, Monina C, NP  Turmeric 500 MG CAPS Take 1 capsule by mouth at bedtime.    [provider]  Vitamin D -Vitamin K (K2 PLUS D3) [phone] MCG-UNIT TABS Take 1 tablet by mouth daily.    [provider]     Allergies:     Allergies  Allergen Reactions   Dilaudid  [Hydromorphone  Hcl] Shortness Of Breath   Gabapentin Other (See Comments)    Hoarseness , headache and sore throat   Latex Rash    Severe rash   Lyrica [Pregabalin] Other (See Comments)    No balance , had to walk with cane , Blurred vision,weakness.   Oxycodone  Shortness Of Breath and Other (See Comments)    Tolerated oxycodone  during admission to hospital 02/06/22   Singulair [Montelukast] Shortness Of Breath and Other (See Comments)    Vision issues, also   Ciprofloxacin Hcl Nausea And Vomiting and Other (See Comments)    Nausea and vomiting with by mouth form   Codeine Other (See Comments)    Hallucinations   Methadone Nausea And Vomiting and Other (See Comments)    Severe nausea and vomiting   Metronidazole  Nausea And Vomiting and Other (See Comments)    Gastric pain   Oysters [Shellfish Allergy] Other (See Comments)    Terrible gastric upset and cramping.   Clindamycin /Lincomycin Diarrhea and Nausea Only   Donepezil Diarrhea and Other (See Comments)    Severe diarrhea   Povidone-Iodine  Other (See  Comments)   Sulfa Antibiotics Diarrhea and Other (See Comments)    GI issues    Tape Other (See Comments)    ADHESIVE TAPE-Severe rash   Zetia [Ezetimibe] Diarrhea   Elemental Sulfur Nausea And Vomiting   Iodine  Rash   Other Rash    All Antibiotic ointments/ creams   Oyster Shell Rash   Povidone Iodine  Rash and Other (See Comments)  Oyster shell products- Rash    Skintegrity Hydrogel [Skin Protectants, Misc.] Rash   Tapentadol Other (See Comments)    Nightmares **Nucynta**     Physical Exam:   Vitals  Blood pressure (!) 111/53, pulse 82, temperature 99.3 F (37.4 C), temperature source Axillary, resp. rate (!) 24, height 5' 2 (1.575 m), weight 46.3 kg, SpO2 97%.   1. General Frail elderly white female lying in hospital bed appearing slightly dehydrated but in no distress,  2. Normal affect and insight, Not Suicidal or Homicidal, Awake Alert,   3. No F.N deficits, ALL C.Nerves Intact, Strength 5/5 all 4 extremities, Sensation intact all 4 extremities, Plantars down going.  4. Ears and Eyes appear Normal, Conjunctivae clear, PERRLA. Moist Oral Mucosa.  5. Supple Neck, No JVD, No cervical lymphadenopathy appriciated, No Carotid Bruits.  6. Symmetrical Chest wall movement, Good air movement bilaterally, CTAB.  7. RRR, No Gallops, Rubs or Murmurs, No Parasternal Heave.  8. Positive Bowel Sounds, Abdomen Soft, No tenderness, No organomegaly appriciated,No rebound -guarding or rigidity.  9.  No Cyanosis, Normal Skin Turgor, No Skin Rash or Bruise.  10. Good muscle tone,  joints appear normal , no effusions, Normal ROM.  11. No Palpable Lymph Nodes in Neck or Axillae  Both lower extremities under bandage, right lower extremity significantly more swollen, red and warm, picture below taken by me on the day of admission     Data Review:   Recent Labs  Lab 10/17/24 1204  WBC 24.2*  HGB 11.6*  HCT 36.4  PLT 308  MCV 88.3  MCH 28.2  MCHC 31.9  RDW 14.1   LYMPHSABS 0.4*  MONOABS 0.8  EOSABS 0.1  BASOSABS 0.1    Recent Labs  Lab 10/17/24 1204 10/17/24 1220 10/17/24 1413  NA 137  --   --   K 3.9  --   --   CL 101  --   --   CO2 22  --   --   ANIONGAP 14  --   --   GLUCOSE 129*  --   --   BUN 15  --   --   CREATININE 0.96  --   --   AST 92*  --   --   ALT 113*  --   --   ALKPHOS 138*  --   --   BILITOT 1.1  --   --   ALBUMIN  3.4*  --   --   LATICACIDVEN  --  3.5* 4.0*  INR 1.0  --   --   CALCIUM  8.7*  --   --     Lab Results  Component Value Date   CHOL 162 09/13/2023   HDL 50 09/13/2023   LDLCALC 103 (H) 09/13/2023   TRIG 45 09/13/2023   CHOLHDL 3.2 09/13/2023    Recent Labs  Lab 10/17/24 1204 10/17/24 1220 10/17/24 1413  LATICACIDVEN  --  3.5* 4.0*  INR 1.0  --   --   CALCIUM  8.7*  --   --     Recent Labs  Lab 10/17/24 1204 10/17/24 1220 10/17/24 1413  WBC 24.2*  --   --   PLT 308  --   --   LATICACIDVEN  --  3.5* 4.0*  CREATININE 0.96  --   --     Urinalysis    Component Value Date/Time   COLORURINE YELLOW 10/17/2024 1548   APPEARANCEUR CLEAR 10/17/2024 1548   LABSPEC 1.017 10/17/2024 1548   PHURINE 7.0 10/17/2024  1548   GLUCOSEU NEGATIVE 10/17/2024 1548   HGBUR SMALL (A) 10/17/2024 1548   BILIRUBINUR NEGATIVE 10/17/2024 1548   KETONESUR NEGATIVE 10/17/2024 1548   PROTEINUR 100 (A) 10/17/2024 1548   UROBILINOGEN 0.2 02/03/2015 1758   NITRITE NEGATIVE 10/17/2024 1548   LEUKOCYTESUR NEGATIVE 10/17/2024 1548      Imaging Results:    CT Cervical Spine Wo Contrast Result Date: 10/17/2024 EXAM: CT CERVICAL SPINE WITHOUT CONTRAST 10/17/2024 04:16:12 PM TECHNIQUE: CT of the cervical spine was performed without the administration of intravenous contrast. Multiplanar reformatted images are provided for review. Automated exposure control, iterative reconstruction, and/or weight based adjustment of the mA/kV was utilized to reduce the radiation dose to as low as reasonably achievable. COMPARISON:  CT cervical spine 04/25/2020. CLINICAL HISTORY: Facial trauma, blunt. Fall. FINDINGS: CERVICAL SPINE: BONES AND ALIGNMENT: No acute fracture. Chronic grade 1 anterolisthesis of C4 on C5, C5 on C6, and C7 on T1. 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 Large chronic cyst or erosion involving the dens. Slight interval widening of the atlantodental interval measuring 2 mm and attributed to progressive arthropathy. Suspected partial fusion across the left lateral C1-C2 articulation. Progressive left atlantooccipital arthropathy. Previous C3-C4 and C6-C7 ACDF. DEGENERATIVE CHANGES: Widespread facet arthrosis with multilevel facet ankylosis. Partially calcified ligamentous thickening/pannus posterior to the dens results in mild to moderate spinal stenosis. SOFT TISSUES: No prevertebral soft tissue swelling. LUNGS: Partially visualized extensive pulmonary ground glass opacities in the right greater than left upper lobes. IMPRESSION: 1. No acute cervical spine fracture. 2. 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 3. Partially visualized bilateral pulmonary ground-glass opacities, possibly reflecting edema or pneumonia (including viral pneumonia). Electronically signed by: Dasie Hamburg MD 10/17/2024 04:39 PM EST RP Workstation: [id number]   CT Head Wo Contrast Result Date: 10/17/2024 EXAM: CT HEAD WITHOUT CONTRAST 10/17/2024 04:16:12 PM TECHNIQUE: CT of the head was performed without the administration of intravenous contrast. Automated exposure control, iterative reconstruction, and/or weight based adjustment of the mA/kV was utilized to reduce the radiation dose to as low as reasonably achievable. COMPARISON: Head CT 02/06/2022. CLINICAL HISTORY: Delirium. FINDINGS: BRAIN AND VENTRICLES: There is no evidence of an acute infarct, intracranial hemorrhage, mass, midline shift, hydrocephalus, or  extra-axial fluid collection. There is mild cerebral atrophy. Patchy to confluent hypodensities in the cerebral white matter bilaterally are similar to the prior study and nonspecific but compatible with moderate chronic small vessel ischemic disease. Calcified atherosclerosis at the skull base. ORBITS: No acute abnormality. Bilateral cataract extraction. SINUSES: Mild mucosal thickening in the ethmoid sinuses. Clear mastoid air cells. SOFT TISSUES AND SKULL: No acute soft tissue abnormality. No skull fracture. IMPRESSION: 1. No acute intracranial abnormality. 2. Moderate chronic small vessel ischemic disease. Electronically signed by: Dasie Hamburg MD 10/17/2024 04:22 PM EST RP Workstation: [id number]   DG Chest Port 1 View Result Date: 10/17/2024 EXAM: 1 VIEW(S) XRAY OF THE CHEST 10/17/2024 01:00:00 PM COMPARISON: Comparison 02/22/2022. CLINICAL HISTORY: Questionable sepsis - evaluate for abnormality. FINDINGS: LUNGS AND PLEURA: No focal pulmonary opacity. No pleural effusion. No pneumothorax. HEART AND MEDIASTINUM: Stable cardiomediastinal silhouette. BONES AND SOFT TISSUES: Status post right shoulder arthroplasty. No acute osseous abnormality. IMPRESSION: 1. No acute cardiopulmonary process. Electronically signed by: Lynwood Seip MD 10/17/2024 01:32 PM EST RP Workstation: [id number]    My personal review of EKG: Rhythm sinus tachycardia with no acute ST changes   Assessment & Plan:   1.  Sepsis present  at the time of admission most likely source appears to be right lower extremity cellulitis, chronic wounds.  All present on admission -she has been started on sepsis protocol in the ER, continue IV fluid bolus along with maintenance, empiric antibiotics ordered, since she is chronically on steroids and blood pressure slightly on the lower side will put her on stress dose steroids for now, she is mentating well and thankfully does not appear toxic, blood cultures drawn, MRSA nasal PCR, follow viral  respiratory panel as well although chances of that being an active infection are low.  Chest x-ray stable, UA not very impressive, wound care consulted, ABIs ordered, monitor cultures.  2.  Severe leukocytosis.  Due to #1 above.  Monitor and hydrate.  3.  Asymptomatic transaminitis with normal bilirubin.  Likely due to #1 above, will check CK level as well as she had 2 falls, hydrate and monitor.  Monitor blood pressure, she has no abdominal pain or right upper quadrant tenderness.  4.  History of chronic lymphedema.  Supportive care.  5.  History of rheumatoid arthritis.  On steroids, for now stress dose steroids.  6.  Chronic exertional shortness of breath.  Supportive care.  EF on echocardiogram done 3 years ago was about 65 to  7.  History of chronic right lower extremity calcaneus osteomyelitis.  Follows with ID Dr. Lindia, for now monitor.  Antibiotics as above.  8.  Hypertension.  Blood pressure soft hold blood pressure medications  9.  Hypothyroidism.  Continue home dose Synthroid .  10.  Multiple falls.  PT OT.  May require SNF.  Check CK.   DVT Prophylaxis Heparin    AM Labs Ordered, also please review Full Orders  Family Communication: Admission, patients condition and plan of care including tests being ordered have been discussed with the patient and daughter Katheryn [phone] over the phone, she is in [place] who indicate understanding and agree with the plan and Code Status.  Code Status DNR  Likely DC to to be decided may require SNF  Condition GUARDED     Consults called: Wound care   Admission status: Inpatient  Time spent in minutes : 45 minutes  Signature  -    Lavada Stank M.D on 10/17/2024 at 6:10 PM   -  To page go to [email or web address]

## 2024-10-17 NOTE — ED Provider Notes (Signed)
 [place] EMERGENCY DEPARTMENT AT [facility] Provider Note   CSN: [id number] Arrival date & time: 10/17/24  1127     Patient presents with: Isabel Barnes is a 83 y.o. female.   Patient was brought to the emergency department by EMS.  Patient is reported to have been found on the floor having had vomiting and diarrhea.  Patient has a caregiver who reports that patient did not take her 4 AM medication so she believes she has been on the floor since at least 4 AM.  She reports patient was confused today.  She is unsure if patient fell.  Patient is unable to provide history.  Patient has a past medical history of a wound to her right leg with frequent infections.  I spoke to patient's daughter Katheryn who is currently in [place].  She reports patient has spoke to palliative care and has requested not to be intubated to not be shocked and no CPR.  Katheryn is available at [phone].  The history is provided by the EMS personnel and a caregiver.  Fall Associated symptoms include chest pain.  Fever Max temp prior to arrival:  102 Temp source:  Oral Severity:  Severe Onset quality:  Unable to specify Chronicity:  New Associated symptoms: chest pain   Risk factors: no recent sickness        Prior to Admission medications   Medication Sig Start Date End Date Taking? Authorizing Provider  acetaminophen  (TYLENOL ) 500 MG tablet Take 2 tablets (1,000 mg total) by mouth 3 (three) times daily. 02/22/22   Atway, Rayann N, DO  amoxicillin -clavulanate (AUGMENTIN ) 875-125 MG tablet Take 1 tablet by mouth 2 (two) times daily. 12/19/23   Fleeta Kathie Jomarie LOISE, MD  aspirin  EC 81 MG tablet Take 1 tablet (81 mg total) by mouth daily. Swallow whole. 09/17/23   Dameron, Marisa, DO  cholecalciferol  (VITAMIN D ) 1000 UNITS tablet Take 1,000 Units by mouth daily.      diclofenac  sodium (VOLTAREN ) 1 % GEL Apply 2 g topically daily as needed (for pain).    [provider]  Digestive  Enzymes (ENZYME DIGEST) CAPS Take 1 capsule by mouth daily as needed (For digestive). 03/23/22   Medina-Vargas, Monina C, NP  ENSURE PLUS (ENSURE PLUS) LIQD Take 237 mLs by mouth daily.    [provider]  ferrous sulfate  325 (65 FE) MG tablet Take 1 tablet (325 mg total) by mouth daily with breakfast. 03/23/22   Medina-Vargas, Monina C, NP  fish oil-omega-3 fatty acids 1000 MG capsule Take 1,000 mg by mouth 2 (two) times daily.     [provider]  fluconazole  (DIFLUCAN ) 150 MG tablet Take 1 tablet (150 mg total) by mouth once a week. 04/02/24   Harden Jerona GAILS, MD  HYDROcodone -acetaminophen  (NORCO) 10-325 MG tablet Take 1 tablet by mouth in the morning, at noon, and at bedtime.    [provider]  Lactobacillus (PROBIOTIC ACIDOPHILUS PO) Take 1 capsule by mouth daily.    [provider]  levothyroxine  (SYNTHROID ) 100 MCG tablet Take 1 tablet (100 mcg total) by mouth every morning. 09/17/23   Dameron, Marisa, DO  loperamide (IMODIUM A-D) 2 MG tablet Take 4 mg by mouth 3 (three) times daily as needed for diarrhea or loose stools.    [provider]  loratadine  (CLARITIN ) 10 MG tablet Take 1 tablet (10 mg total) by mouth daily. 09/16/23   Dameron, Marisa, DO  losartan  (COZAAR ) 25 MG tablet Take 1  tablet (25 mg total) by mouth daily. Patient taking differently: Take 100 mg by mouth daily. 03/23/22   Medina-Vargas, Monina C, NP  methocarbamol  (ROBAXIN ) 500 MG tablet Take 500 mg by mouth every 6 (six) hours as needed for muscle spasms.    [provider]  Multiple Vitamins-Minerals (MULTIVITAMIN PO) Take 1 tablet by mouth daily.    [provider]  ondansetron  (ZOFRAN ) 4 MG tablet Take 1 tablet (4 mg total) by mouth 2 (two) times daily. Take before each dose of augmentin  and can take again if needed up to 3 doses per day 05/17/24   Fleeta Rothman, Jomarie SAILOR, MD  polyethylene glycol (MIRALAX  / GLYCOLAX ) 17 g packet Take 17 g by mouth daily. 03/23/22    Medina-Vargas, Monina C, NP  predniSONE  (DELTASONE ) 10 MG tablet TAKE 1 TABLET (10 MG TOTAL) BY MOUTH DAILY WITH BREAKFAST. 08/08/24   Valdemar Rocky SAUNDERS, NP  RESTASIS  0.05 % ophthalmic emulsion Place 1 drop into both eyes daily. 03/23/22   Medina-Vargas, Monina C, NP  silver  sulfADIAZINE  (SILVADENE ) 1 % cream Apply 1 Application topically 2 (two) times daily. 12/14/22   [provider]  tetrahydrozoline 0.05 % ophthalmic solution Place 1 drop into both eyes 4 (four) times daily.    [provider]  triamcinolone  (NASACORT ) 55 MCG/ACT AERO nasal inhaler Place 1-2 sprays into the nose 2 (two) times daily as needed (for seasonal allergies). 03/23/22   Medina-Vargas, Monina C, NP  Turmeric 500 MG CAPS Take 1 capsule by mouth at bedtime.    [provider]  Vitamin D -Vitamin K (K2 PLUS D3) [phone] MCG-UNIT TABS Take 1 tablet by mouth daily.    [provider]    Allergies: Dilaudid  [hydromorphone  hcl]; Gabapentin; Latex; Lyrica [pregabalin]; Oxycodone ; Singulair [montelukast]; Ciprofloxacin hcl; Codeine; Methadone; Metronidazole ; Oysters [shellfish allergy]; Clindamycin /lincomycin; Donepezil; Povidone-iodine ; Sulfa antibiotics; Tape; Zetia [ezetimibe]; Elemental sulfur; Iodine ; Other; Oyster shell; Povidone iodine ; Skintegrity hydrogel [skin protectants, misc.]; and Tapentadol    Review of Systems  Unable to perform ROS: Dementia  Constitutional:  Positive for fever.  Cardiovascular:  Positive for chest pain.    Updated Vital Signs BP (!) 191/81 (BP Location: Right Arm)   Pulse (!) 136   Temp (!) 102.4 F (39.1 C) (Oral)   Resp (!) 37   Ht 5' 2 (1.575 m)   Wt 46.3 kg   SpO2 93%   BMI 18.66 kg/m   Physical Exam Vitals and nursing note reviewed.  Constitutional:      Appearance: She is well-developed. She is ill-appearing.  HENT:     Head: Normocephalic.  Cardiovascular:     Rate and Rhythm: Tachycardia present.  Pulmonary:     Comments:  Tachypneic Abdominal:     General: There is no distension.  Musculoskeletal:        General: Swelling present.     Cervical back: Normal range of motion.     Comments: Swelling right leg erythema to mid thigh  Skin:    General: Skin is warm.  Neurological:     General: No focal deficit present.     Mental Status: She is alert.     (all labs ordered are listed, but only abnormal results are displayed) Labs Reviewed  COMPREHENSIVE METABOLIC PANEL WITH GFR - Abnormal; Notable for the following components:      Result Value   Calcium  8.7 (*)    Total Protein 6.3 (*)    Albumin  3.4 (*)    AST 92 (*)  ALT 113 (*)    Alkaline Phosphatase 138 (*)    GFR, Estimated 58 (*)    All other components within normal limits  CBC WITH DIFFERENTIAL/PLATELET - Abnormal; Notable for the following components:   WBC 24.2 (*)    Hemoglobin 11.6 (*)    Neutro Abs 22.6 (*)    Lymphs Abs 0.4 (*)    Abs Immature Granulocytes 0.20 (*)    All other components within normal limits  I-STAT CG4 LACTIC ACID, ED - Abnormal; Notable for the following components:   Lactic Acid, Venous 3.5 (*)    All other components within normal limits  CULTURE, BLOOD (ROUTINE X 2)  CULTURE, BLOOD (ROUTINE X 2)  RESP PANEL BY RT-PCR (RSV, FLU A&B, COVID)  RVPGX2  PROTIME-INR  URINALYSIS, W/ REFLEX TO CULTURE (INFECTION SUSPECTED)  CK  I-STAT CG4 LACTIC ACID, ED    EKG: None  Radiology: DG Chest Port 1 View Result Date: 10/17/2024 EXAM: 1 VIEW(S) XRAY OF THE CHEST 10/17/2024 01:00:00 PM COMPARISON: Comparison 02/22/2022. CLINICAL HISTORY: Questionable sepsis - evaluate for abnormality. FINDINGS: LUNGS AND PLEURA: No focal pulmonary opacity. No pleural effusion. No pneumothorax. HEART AND MEDIASTINUM: Stable cardiomediastinal silhouette. BONES AND SOFT TISSUES: Status post right shoulder arthroplasty. No acute osseous abnormality. IMPRESSION: 1. No acute cardiopulmonary process. Electronically signed by: Lynwood Seip  MD 10/17/2024 01:32 PM EST RP Workstation: [id number]     .Critical Care  Performed by: Flint Sonny POUR, PA-C Authorized by: Flint Sonny POUR, PA-C   Critical care provider statement:    Critical care time (minutes):  45   Critical care start time:  10/17/2024 12:00 PM   Critical care end time:  10/17/2024 3:16 PM   Critical care time was exclusive of:  Separately billable procedures and treating other patients   Critical care was necessary to treat or prevent imminent or life-threatening deterioration of the following conditions:  CNS failure or compromise and sepsis   Critical care was time spent personally by me on the following activities:  Blood draw for specimens, development of treatment plan with patient or surrogate, discussions with consultants, ordering and performing treatments and interventions, ordering and review of laboratory studies, ordering and review of radiographic studies, pulse oximetry, re-evaluation of patient's condition, obtaining history from patient or surrogate, interpretation of cardiac output measurements, examination of patient and review of old charts Comments:     Pt's care turned over to [facility],     Medications Ordered in the ED  vancomycin  (VANCOCIN ) IVPB 1000 mg/200 mL premix (has no administration in time range)  metroNIDAZOLE  (FLAGYL ) IVPB 500 mg (500 mg Intravenous New Bag/Given 10/17/24 1351)  acetaminophen  (TYLENOL ) tablet 650 mg (650 mg Oral Given 10/17/24 1259)  lactated ringers  bolus 1,000 mL (1,000 mLs Intravenous New Bag/Given 10/17/24 1257)  ceFEPIme  (MAXIPIME ) 2 g in sodium chloride  0.9 % 100 mL IVPB (0 g Intravenous Stopped 10/17/24 1353)  ondansetron  (ZOFRAN ) injection 4 mg (4 mg Intravenous Given 10/17/24 1303)    Clinical Course as of 10/17/24 1515  Wed Oct 17, 2024  1514 WBC(!): 24.2 [JS]    Clinical Course User Index [JS] Soto, Johana, PA-C                                 Medical Decision Making Patient was found in  the floor by her caregiver who checks on her in her home.  Patient had had vomiting diarrhea and was  laying in the floor.  Patient's caregiver is Burnard Ancona her number is [phone]  Amount and/or Complexity of Data Reviewed Independent Historian: caregiver    Details: Patient has most likely been in the floor since 4 AM External Data Reviewed: notes.    Details: Primary care notes from [facility] reviewed.  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    Details: Labs ordered reviewed and interpreted.  Lactic acid is 3.5 white blood cell count is 24.2 Radiology: ordered and independent interpretation performed. Decision-making details documented in ED Course.    Details: Chest x-ray ordered reviewed and interpreted chest x-ray shows no acute findings  Risk OTC drugs. Prescription drug management.        Final diagnoses:  Sepsis, due to unspecified organism, unspecified whether acute organ dysfunction present [facility])  Cellulitis of leg, right    ED Discharge Orders     None          Flint Sonny POUR, PA-C 10/17/24 1517    Stanek, Lawrence S, MD 10/18/24 [phone]

## 2024-10-17 NOTE — ED Provider Notes (Signed)
  Physical Exam  BP (!) 111/53   Pulse 82   Temp 98.8 F (37.1 C) (Axillary)   Resp (!) 24   Ht 5' 2 (1.575 m)   Wt 46.3 kg   SpO2 97%   BMI 18.66 kg/m   Physical Exam Vitals and nursing note reviewed.  Constitutional:      Appearance: Normal appearance.  HENT:     Head: Normocephalic.     Comments: No obvious signs of trauma.     Mouth/Throat:     Mouth: Mucous membranes are moist.  Cardiovascular:     Rate and Rhythm: Normal rate.  Pulmonary:     Effort: Pulmonary effort is normal.  Abdominal:     General: Abdomen is flat.  Musculoskeletal:     Cervical back: Normal range of motion and neck supple.  Skin:    General: Skin is warm and dry.  Neurological:     Mental Status: She is alert and oriented to person, place, and time.     Procedures  Procedures  ED Course / MDM   Clinical Course as of 10/17/24 1736  Wed Oct 17, 2024  1514 WBC(!): 24.2 [JS]    Clinical Course User Index [JS] Maanvi Lecompte, PA-C   Medical Decision Making Amount and/or Complexity of Data Reviewed Labs: ordered. Decision-making details documented in ED Course. Radiology: ordered.  Risk OTC drugs. Prescription drug management. Decision regarding hospitalization.   Patient care assumed from Dante PA at shift change, please see note for a full HPI. Briefly, patient here from home found down with some vomiting and diarrhea. Patient is here from home with nausea, vomiting, diarrhea, known leg wound and a white count of 24,000, lactic acid of 4.  Likely septic from the leg wound.  Plan was to call hospitalist patient is a DNR at this time per family.  3:35 PM I spoke to Dr. Patsy, who would like a CT Head on this patient prior to admission. Patient is AO X4. He would like this consult repaged after resulting CT.   5:35 PM Spoke to Dr. Dennise who will admit patient for further management.   Portions of this note were generated with Scientist, clinical (histocompatibility and immunogenetics). Dictation errors may occur  despite best attempts at proofreading.          Mehdi Gironda, PA-C 10/17/24 1736    Kammerer, Megan L, DO 10/18/24 1732

## 2024-10-17 NOTE — ED Notes (Addendum)
 This RN to get EDP to assess pt , EDP Kammerer and [place] PA to bedside, ordered to call RT for bipap

## 2024-10-17 NOTE — ED Triage Notes (Signed)
 Pt to er via ems, per ems pt fell yesterday and today, states that she got a covid shot and feels tired and weak.  Pt taking in full sentences, pt states that she is here because she is short of breath.

## 2024-10-17 NOTE — Progress Notes (Signed)
 Pharmacy Antibiotic Note  Isabel Barnes is a 83 y.o. female admitted on 10/17/2024 with cellulitis.  Pharmacy has been consulted for Zosyn  and Zyvox  dosing.  Plan: Zosyn  3.375g IV q8h (4 hour infusion). Zyvox  600 mg IV q12h   Height: 5' 2 (157.5 cm) Weight: 46.3 kg (102 lb) IBW/kg (Calculated) : 50.1  Temp (24hrs), Avg:100.2 F (37.9 C), Min:98.8 F (37.1 C), Max:102.4 F (39.1 C)  Recent Labs  Lab 10/17/24 1204 10/17/24 1220 10/17/24 1413  WBC 24.2*  --   --   CREATININE 0.96  --   --   LATICACIDVEN  --  3.5* 4.0*    Estimated Creatinine Clearance: 31.3 mL/min (by C-G formula based on SCr of 0.96 mg/dL).    Allergies  Allergen Reactions   Dilaudid  [Hydromorphone  Hcl] Shortness Of Breath   Gabapentin Other (See Comments)    Hoarseness , headache and sore throat   Latex Rash    Severe rash   Lyrica [Pregabalin] Other (See Comments)    No balance , had to walk with cane , Blurred vision,weakness.   Oxycodone  Shortness Of Breath and Other (See Comments)    Tolerated oxycodone  during admission to hospital 02/06/22   Singulair [Montelukast] Shortness Of Breath and Other (See Comments)    Vision issues, also   Ciprofloxacin Hcl Nausea And Vomiting and Other (See Comments)    Nausea and vomiting with by mouth form   Codeine Other (See Comments)    Hallucinations   Methadone Nausea And Vomiting and Other (See Comments)    Severe nausea and vomiting   Metronidazole  Nausea And Vomiting and Other (See Comments)    Gastric pain   Oysters [Shellfish Allergy] Other (See Comments)    Terrible gastric upset and cramping.   Clindamycin /Lincomycin Diarrhea and Nausea Only   Donepezil Diarrhea and Other (See Comments)    Severe diarrhea   Povidone-Iodine  Other (See Comments)   Sulfa Antibiotics Diarrhea and Other (See Comments)    GI issues    Tape Other (See Comments)    ADHESIVE TAPE-Severe rash   Zetia [Ezetimibe] Diarrhea   Elemental Sulfur Nausea And Vomiting    Iodine  Rash   Other Rash    All Antibiotic ointments/ creams   Oyster Shell Rash   Povidone Iodine  Rash and Other (See Comments)    Oyster shell products- Rash    Skintegrity Hydrogel [Skin Protectants, Misc.] Rash   Tapentadol Other (See Comments)    Nightmares **Nucynta**    Antimicrobials this admission: Vancomycin  x 1   Cefepime  x 1  Metronidazole  x 1   Microbiology results: 11/19 BCx:  11/19 MRSA PCR:   Thank you for allowing pharmacy to be a part of this patient's care.  Zain Bingman M Karesa Maultsby 10/17/2024 6:18 PM

## 2024-10-17 NOTE — ED Notes (Signed)
 Pt not tolerating bipap

## 2024-10-17 NOTE — Consult Note (Incomplete)
 Cardiology Consultation   [name] MRN: [id number]; DOB: 01-26-1941  Admit date: 10/17/2024 Date of Consult: 10/18/2024  PCP:  [place] Area, PA-C   [facility] Providers Cardiologist:  Deatrice Cage, MD        Patient Profile:   Isabel Barnes is a 83 y.o. 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 Dennise.  History of Present Illness:   Called EMS today after a fall and brought into the ED. Found to be septic with WBC 24, tachypneic, tachycardic, presumed from her LE cellulitis. Admitted to medicine. Noted then not to be complaining of SOB or CP on admission. Was given 2L IVF per sepsis protocol and started on broad spectrum antibiotics.  Overnight, became acutely SOB. [facility] came to assess, CXR showing increased bilateral perihilar interstitial opacities so IV lasix  was given. Hstroponin obtained, which resulted 4.2k (previously tropoonins in the 100s back in 2022). EKG with wandering baseline, but no clear ST changes or TWI. She was loaded with ASA and started on heparin  gtt and cardiology was consulted.   She has also been agitated/hyperactive delirium overnight, 0.5mg  ativan  given to help control, difficult to assess her symptoms, though when she's able to respond to questions, she notes increased SOB.   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  head CT   Depression    GERD (gastroesophageal reflux disease)    takes Omeprazole daily   Heart murmur    mild MS, moderate-severe AS 03/17/21 echo   History of bronchitis    20+ yrs ago   History of kidney stones    3 surgerical removed, 1 passed   History of prolapse of bladder    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 Albuterol  inhaler as needed   Spinal headache 1991   blood patch placed   Spondylitis    Unsteady gait    occasionally   Urinary urgency     Past Surgical History:  Procedure Laterality Date   ABDOMINAL AORTOGRAM W/LOWER EXTREMITY N/A 02/15/2022   Procedure: ABDOMINAL AORTOGRAM W/LOWER EXTREMITY;  Surgeon: Sheree Penne Bruckner, MD;  Location: [facility], [place] INVASIVE CV LAB;  Service: Cardiovascular;  Laterality: N/A;   ABDOMINAL AORTOGRAM W/LOWER EXTREMITY N/A 09/12/2023   Procedure: ABDOMINAL AORTOGRAM W/LOWER EXTREMITY;  Surgeon: Lanis Fonda BRAVO, MD;  Location: [facility] INVASIVE CV  LAB;  Service: Cardiovascular;  Laterality: N/A;   ABDOMINAL HYSTERECTOMY     ANTERIOR FUSION CERVICAL SPINE     x2 -C4-7   APPLICATION OF WOUND VAC Right 03/12/2022   Procedure: APPLICATION OF WOUND VAC;  Surgeon: Harden Jerona GAILS, MD;  Location: [facility] OR;  Service: Orthopedics;  Laterality: Right;   BUNIONECTOMY Bilateral    COLONOSCOPY     CYSTOSCOPY W/ URETEROSCOPY  2012   EYE SURGERY Bilateral    cataract /lens implant   HOLMIUM LASER APPLICATION Left 02/08/2013   Procedure: HOLMIUM LASER APPLICATION;  Surgeon: Norleen JINNY Seltzer, MD;  Location: [facility];  Service: Urology;  Laterality: Left;    I & D EXTREMITY Right 02/10/2022   Procedure: DEBRIDEMENT RIGHT LEG ABSCESS;  Surgeon: Harden Jerona GAILS, MD;  Location: [facility] OR;  Service: Orthopedics;  Laterality: Right;   I & D EXTREMITY Right 03/12/2022   Procedure: RIGHT LEG IRRIGATION AND DEBRIDEMENT EXTREMITY;  Surgeon: Harden Jerona GAILS, MD;  Location: [facility] OR;  Service: Orthopedics;  Laterality: Right;   INSERTION OF MESH N/A 07/15/2014   Procedure: INSERTION OF MESH;  Surgeon: Elspeth KYM Schultze, MD;  Location: [facility] OR;  Service: General;  Laterality: N/A;   JOINT REPLACEMENT Right 2012   shoulder   LAPAROSCOPIC CHOLECYSTECTOMY W/ CHOLANGIOGRAPHY  2012   Dr Vernetta   NASAL SINUS SURGERY     OPEN REDUCTION INTERNAL FIXATION (ORIF) DISTAL RADIAL FRACTURE Left 03/20/2021   Procedure: OPEN REDUCTION INTERNAL FIXATION (ORIF) DISTAL RADIAL FRACTURE;  Surgeon: Josefina Chew, MD;  Location: [facility] OR;  Service: Orthopedics;  Laterality: Left;   RADIOLOGY WITH ANESTHESIA N/A 05/09/2014   Procedure: ADULT SEDATION WITH ANESTHESIA/MRI CERVICAL SPINE WITHOUT CONTRAST;  Surgeon: Medication Radiologist, MD;  Location: [facility] OR;  Service: Radiology;  Laterality: N/A;  DR. HAWKS/MRI   right knee arthroscopy     d/t meniscal tear   SHOULDER ARTHROSCOPY W/ ROTATOR CUFF REPAIR Bilateral three times each over several yrs   SKIN FULL THICKNESS GRAFT Right 03/12/2022   Procedure: SKIN GRAFT FULL THICKNESS;  Surgeon: Harden Jerona GAILS, MD;  Location: [facility] OR;  Service: Orthopedics;  Laterality: Right;   SKIN SPLIT GRAFT Right 02/17/2022   Procedure: RIGHT LEG SKIN GRAFT;  Surgeon: Harden Jerona GAILS, MD;  Location: [facility] OR;  Service: Orthopedics;  Laterality: Right;   THUMB ARTHROSCOPY Left    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   Procedure: RIGHT TOTAL KNEE ARTHROPLASTY;  Surgeon: Norleen Gavel, MD;  Location: [facility] OR;  Service: Orthopedics;  Laterality: Right;   UMBILICAL HERNIA REPAIR N/A 07/15/2014   Procedure: LAPAROSCOPIC UMBILICAL AND INFRAUMBILICAL HERNIA;  Surgeon: Elspeth KYM Schultze,  MD;  Location: [facility] OR;  Service: General;  Laterality: N/A;     Home Medications:  Prior to Admission medications   Medication Sig Start Date End Date Taking? Authorizing Provider  Calcium -Vitamin D -Vitamin K ([employer] CALCIUM  CHEWS PO) Take 1 tablet by mouth daily.   Yes [provider]  cholecalciferol  (VITAMIN D ) 1000 UNITS tablet Take 1,000 Units by mouth daily.   Yes   diclofenac  sodium (VOLTAREN ) 1 % GEL Apply 2 g topically daily as needed (for knee pain).   Yes [provider]  diphenhydrAMINE  (BENADRYL ) 25 MG tablet Take 25 mg by mouth at bedtime as needed for allergies or sleep.   Yes [provider]  HYDROcodone -acetaminophen  (NORCO) 10-325 MG tablet Take 1 tablet by mouth every 6 (six) hours. 0400, 1000, 1600, 2200   Yes [provider]  hydrOXYzine  (  ATARAX) 25 MG tablet Take 25 mg by mouth 3 (three) times daily as needed for anxiety.   Yes [provider]  Lactobacillus (PROBIOTIC ACIDOPHILUS PO) Take 1 capsule by mouth daily.   Yes [provider]  levothyroxine  (SYNTHROID ) 75 MCG tablet Take 75 mcg by mouth daily. 09/24/24  Yes [provider]  loperamide (IMODIUM A-D) 2 MG tablet Take 4 mg by mouth 3 (three) times daily as needed for diarrhea or loose stools.   Yes [provider]  losartan  (COZAAR ) 25 MG tablet Take 1 tablet (25 mg total) by mouth daily. Patient taking differently: Take 100 mg by mouth daily. 03/23/22  Yes Medina-Vargas, Monina C, NP  Melatonin 5 MG CHEW Chew 10 mg by mouth at bedtime.   Yes [provider]  methocarbamol  (ROBAXIN ) 500 MG tablet Take 500 mg by mouth daily as needed for muscle spasms.   Yes [provider]  Multiple Vitamins-Minerals (MULTIVITAMIN PO) Take 1 tablet by mouth daily.   Yes [provider]  ondansetron  (ZOFRAN ) 4 MG tablet Take 1 tablet (4 mg total) by mouth 2 (two) times daily. Take before each dose of augmentin  and can take again if needed up  to 3 doses per day Patient taking differently: Take 4 mg by mouth 2 (two) times daily as needed for nausea or vomiting. Take before each dose of augmentin  and can take again if needed up to 3 doses per day 05/17/24  Yes Fleeta Rothman, Jomarie SAILOR, MD  RESTASIS  0.05 % ophthalmic emulsion Place 1 drop into both eyes daily. 03/23/22  Yes Medina-Vargas, Monina C, NP  Wound Cleansers (VASHE WOUND THERAPY EX) Apply 1 application  topically daily. Given samples by provider   Yes [provider]  amoxicillin -clavulanate (AUGMENTIN ) 875-125 MG tablet Take 1 tablet by mouth 2 (two) times daily. Patient not taking: Reported on 10/17/2024 12/19/23   Fleeta Rothman, Jomarie SAILOR, MD  aspirin  EC 81 MG tablet Take 1 tablet (81 mg total) by mouth daily. Swallow whole. Patient not taking: Reported on 10/17/2024 09/17/23   Dameron, Marisa, DO  predniSONE  (DELTASONE ) 10 MG tablet TAKE 1 TABLET (10 MG TOTAL) BY MOUTH DAILY WITH BREAKFAST. Patient not taking: Reported on 10/17/2024 08/08/24   Zamora, Erin R, NP  tetrahydrozoline 0.05 % ophthalmic solution Place 1 drop into both eyes 4 (four) times daily. Patient not taking: Reported on 10/17/2024    [provider]  triamcinolone  (NASACORT ) 55 MCG/ACT AERO nasal inhaler Place 1-2 sprays into the nose 2 (two) times daily as needed (for seasonal allergies). Patient not taking: Reported on 10/17/2024 03/23/22   Medina-Vargas, Monina C, NP    Inpatient Medications: Scheduled Meds:  aspirin  EC  81 mg Oral Daily   cycloSPORINE   1 drop Both Eyes Daily   hydrocortisone  sod succinate (SOLU-CORTEF ) inj  100 mg Intravenous Q8H   hydrOXYzine  25 mg Oral Once   Continuous Infusions:  heparin  550 Units/hr (10/18/24 0015)   linezolid (ZYVOX) IV Stopped (10/17/24 2252)   piperacillin -tazobactam (ZOSYN )  IV Stopped (10/17/24 2114)   PRN Meds: acetaminophen  **OR** acetaminophen , HYDROcodone -acetaminophen , naphazoline-glycerin, nitroGLYCERIN , ondansetron  **OR** ondansetron   (ZOFRAN ) IV, triamcinolone   Allergies:    Allergies  Allergen Reactions   Dilaudid  [Hydromorphone  Hcl] Shortness Of Breath   Gabapentin Other (See Comments)    Hoarseness , headache and sore throat   Latex Rash    Severe rash   Lyrica [Pregabalin] Other (See Comments)    No balance , had to walk with cane ,  Blurred vision,weakness.   Oxycodone  Shortness Of Breath and Other (See Comments)    Tolerated oxycodone  during admission to hospital 02/06/22   Singulair [Montelukast] Shortness Of Breath and Other (See Comments)    Vision issues, also   Ciprofloxacin Hcl Nausea And Vomiting and Other (See Comments)    Nausea and vomiting with by mouth form   Codeine Other (See Comments)    Hallucinations   Methadone Nausea And Vomiting and Other (See Comments)    Severe nausea and vomiting   Metronidazole  Nausea And Vomiting and Other (See Comments)    Gastric pain   Oysters [Shellfish Allergy] Other (See Comments)    Terrible gastric upset and cramping.   Clindamycin /Lincomycin Diarrhea and Nausea Only   Donepezil Diarrhea and Other (See Comments)    Severe diarrhea   Ferrous Sulfate      Severe gi upset, will get an infusion when needed per caregiver   Sulfa Antibiotics Diarrhea and Other (See Comments)    GI issues    Tape Other (See Comments)    ADHESIVE TAPE-Severe rash   Zetia [Ezetimibe] Diarrhea   Elemental Sulfur Nausea And Vomiting   Iodine  Rash    Duplicate entry   Other Rash    All Antibiotic ointments/ creams   Oyster Shell Rash   Povidone Iodine  Rash and Other (See Comments)    Oyster shell products- Rash    Povidone-Iodine      Duplicate entry   Skintegrity Hydrogel [Skin Protectants, Misc.] Rash   Tapentadol Other (See Comments)    Nightmares **Nucynta**    Social History:   Social History   Socioeconomic History   Marital status: Divorced    Spouse name: Not on file   Number of children: Not on file   Years of education: Not on file   Highest  education level: Not on file  Occupational History   Not on file  Tobacco Use   Smoking status: Former    Current packs/day: 0.00    Average packs/day: 0.5 packs/day for 2.0 years (1.0 ttl pk-yrs)    Types: Cigarettes    Start date: 02/06/1990    Quit date: 02/07/1992    Years since quitting: 32.7   Smokeless tobacco: Never  Vaping Use   Vaping status: Never Used  Substance and Sexual Activity   Alcohol  use: No   Drug use: No   Sexual activity: Not on file  Other Topics Concern   Not on file  Social History Narrative   Not on file   Social Drivers of Health   Financial Resource Strain: Low Risk  (05/25/2024)   Received from [facility]   Overall Financial Resource Strain (CARDIA)    Difficulty of Paying Living Expenses: Not hard at all  Food Insecurity: Low Risk  (10/15/2024)   Received from [facility]   Hunger Vital Sign    Within the past 12 months, you worried that your food would run out before you got money to buy more: Never true    Within the past 12 months, the food you bought just didn't last and you didn't have money to get more. : Never true  Transportation Needs: No Transportation Needs (10/15/2024)   Received from [facility]    In the past 12 months, has lack of reliable transportation kept you from medical appointments, meetings, work or from getting things needed for daily living? : No  Physical Activity: Unknown (05/25/2024)   Received from [facility]   Exercise Vital  Sign    On average, how many days per week do you engage in moderate to strenuous exercise (like a brisk walk)?: 0 days    Minutes of Exercise per Session: Not on file  Stress: No Stress Concern Present (05/25/2024)   Received from [facility] - Occupational Stress Questionnaire    Feeling of Stress : Only a little  Social Connections: Somewhat Isolated (05/25/2024)   Received from [facility]   Social Network    How  would you rate your social network (family, work, friends)?: Restricted participation with some degree of social isolation  Intimate Partner Violence: Not At Risk (05/25/2024)   Received from [facility]   HITS    Over the last 12 months how often did your partner physically hurt you?: Never    Over the last 12 months how often did your partner insult you or talk down to you?: Never    Over the last 12 months how often did your partner threaten you with physical harm?: Never    Over the last 12 months how often did your partner scream or curse at you?: Never    Family History:   Family History  Problem Relation Age of Onset   Stroke Father      ROS:  Please see the history of present illness.  All other ROS reviewed and negative.     Physical Exam/Data:   Vitals:   10/17/24 2200 10/17/24 2330 10/17/24 2353 10/18/24 0015  BP:  (!) 144/80  (!) 161/81  Pulse: (!) 110 (!) 101  92  Resp: (!) 21 (!) 26  (!) 25  Temp:   97.9 F (36.6 C)   TempSrc:   Oral   SpO2: 93% 96%  97%  Weight:      Height:       No intake or output data in the 24 hours ending 10/18/24 0046    10/17/2024   11:29 AM 12/19/2023   11:18 AM 10/24/2023    2:34 PM  Last 3 Weights  Weight (lbs) 102 lb 122 lb 122 lb  Weight (kg) 46.267 kg 55.339 kg 55.339 kg     Body mass index is 18.66 kg/m.  General:  Chronically ill elderly lady HEENT: normal Vascular: No carotid bruits; Distal pulses 2+ bilaterally Cardiac:  normal S1, S2; RRR; no murmur Lungs:  decreased breath sounds throughout, bibasilar breath sounds Abd: soft, nontender, no hepatomegaly  Ext: no edema Musculoskeletal:  No deformities, BUE and BLE strength normal and equal Skin: warm and dry  Neuro:  CNs 2-12 intact, no focal abnormalities noted Psych:  Normal affect   EKG:  The EKG was personally reviewed and demonstrates:  NSR, wandering baseline, no significant ST changes Telemetry:  Telemetry was personally reviewed and demonstrates:   NSR  Relevant CV Studies:  TTE 2023: 1. Left ventricular ejection fraction, by estimation, is 60 to 65%. The  left ventricle has normal function. The left ventricle has no regional  wall motion abnormalities. Left ventricular diastolic parameters are  consistent with Grade II diastolic  dysfunction (pseudonormalization). Elevated left ventricular end-diastolic  pressure.   2. Right ventricular systolic function is normal. The right ventricular  size is normal. There is mildly elevated pulmonary artery systolic  pressure. The estimated right ventricular systolic pressure is 39.6 mmHg.   3. Left atrial size was moderately dilated.   4. The pericardial effusion is anterior to the right ventricle and  surrounding the apex.  Moderate pleural effusion in the left lateral  region.   5. The mitral valve is normal in structure. Mild mitral valve  regurgitation. No evidence of mitral stenosis. Severe mitral annular  calcification.   6. The aortic valve is normal in structure. There is severe calcifcation  of the aortic valve. There is severe thickening of the aortic valve.  Aortic valve regurgitation is not visualized. Moderate aortic valve  stenosis. Aortic valve area, by VTI  measures 1.67 cm. Aortic valve mean gradient measures 24.0 mmHg. Aortic  valve Vmax measures 3.30 m/s.   7. The inferior vena cava is dilated in size with >50% respiratory  variability, suggesting right atrial pressure of 8 mmHg.   8. Compared to echo dated 03/17/2021, the mean AVG has decreased from  to , AV VTi has decreased from 3.83m/s to 3.3 m/s and DVI has  increased from 0.28 to 0.44.   NM MPI 2022: ST segment depression was noted during stress in the II, III, aVF, V4 and V5 leads. No T wave inversion was noted during stress. This is a low risk study. The left ventricular ejection fraction is hyperdynamic (>65%).   IMPRESSIONS PACs through study. 1 mm horizontal ST depressions with stress as  noted above. Hyperdynamic LVEF No perfusion defects There is small subdiaphragmatic activity seen in the field during rest and stress; no evidence of patient movement. TID of 1.28 reported but without qualitative dilation and with LVEDVi of 36 ml/m2.  RECOMMENDATIONS/CONCLUSIONS Negative stress test for ischemia or infarction.  TID 1.28 as above. The study is consistent with a low risk study.  Laboratory Data:  High Sensitivity Troponin:   Recent Labs  Lab 10/17/24 2300  TROPONINIHS 4,245*     Chemistry Recent Labs  Lab 10/17/24 1204 10/17/24 2259  NA 137  --   K 3.9  --   CL 101  --   CO2 22  --   GLUCOSE 129*  --   BUN 15  --   CREATININE 0.96 0.93  CALCIUM  8.7*  --   GFRNONAA 58* >60  ANIONGAP 14  --     Recent Labs  Lab 10/17/24 1204  PROT 6.3*  ALBUMIN  3.4*  AST 92*  ALT 113*  ALKPHOS 138*  BILITOT 1.1   Lipids No results for input(s): CHOL, TRIG, HDL, LABVLDL, LDLCALC, CHOLHDL in the last 168 hours.  Hematology Recent Labs  Lab 10/17/24 1204 10/17/24 2259  WBC 24.2* 20.1*  RBC 4.12 4.21  HGB 11.6* 11.7*  HCT 36.4 37.2  MCV 88.3 88.4  MCH 28.2 27.8  MCHC 31.9 31.5  RDW 14.1 14.2  PLT 308 289   Thyroid No results for input(s): TSH, FREET4 in the last 168 hours.  BNP Recent Labs  Lab 10/17/24 2300  BNP 2,245.5*    DDimer No results for input(s): DDIMER in the last 168 hours.   Radiology/Studies:  DG Chest Port 1 View Result Date: 10/17/2024 EXAM: 1 VIEW(S) XRAY OF THE CHEST 10/17/2024 10:34:00 PM COMPARISON: 10/17/2024 CLINICAL HISTORY: SOB (shortness of breath) FINDINGS: LUNGS AND PLEURA: Increased bilateral perihilar interstitial and airspace opacities. No pleural effusion. No pneumothorax. Limited evaluation due to rotation. HEART AND MEDIASTINUM: Cardiomegaly. BONES AND SOFT TISSUES: Right shoulder arthroplasty. ACDF noted. No acute osseous abnormality. IMPRESSION: 1. Increased bilateral perihilar interstitial and  airspace opacities. Limited evaluation due to rotation. 2. Cardiomegaly. Electronically signed by: Morgane Naveau MD 10/17/2024 10:58 PM EST RP Workstation: [id number]   CT Cervical Spine Wo Contrast Result Date: 10/17/2024  EXAM: CT CERVICAL SPINE WITHOUT CONTRAST 10/17/2024 04:16:12 PM TECHNIQUE: CT of the cervical spine was performed without the administration of intravenous contrast. Multiplanar reformatted images are provided for review. Automated exposure control, iterative reconstruction, and/or weight based adjustment of the mA/kV was utilized to reduce the radiation dose to as low as reasonably achievable. COMPARISON: CT cervical spine 04/25/2020. CLINICAL HISTORY: Facial trauma, blunt. Fall. FINDINGS: CERVICAL SPINE: BONES AND ALIGNMENT: No acute fracture. Chronic grade 1 anterolisthesis of C4 on C5, C5 on C6, and C7 on T1. 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 Large chronic cyst or erosion involving the dens. Slight interval widening of the atlantodental interval measuring 2 mm and attributed to progressive arthropathy. Suspected partial fusion across the left lateral C1-C2 articulation. Progressive left atlantooccipital arthropathy. Previous C3-C4 and C6-C7 ACDF. DEGENERATIVE CHANGES: Widespread facet arthrosis with multilevel facet ankylosis. Partially calcified ligamentous thickening/pannus posterior to the dens results in mild to moderate spinal stenosis. SOFT TISSUES: No prevertebral soft tissue swelling. LUNGS: Partially visualized extensive pulmonary ground glass opacities in the right greater than left upper lobes. IMPRESSION: 1. No acute cervical spine fracture. 2. 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 3. Partially visualized bilateral pulmonary ground-glass opacities, possibly reflecting edema or pneumonia (including viral pneumonia). Electronically signed  by: Dasie Hamburg MD 10/17/2024 04:39 PM EST RP Workstation: [id number]   CT Head Wo Contrast Result Date: 10/17/2024 EXAM: CT HEAD WITHOUT CONTRAST 10/17/2024 04:16:12 PM TECHNIQUE: CT of the head was performed without the administration of intravenous contrast. Automated exposure control, iterative reconstruction, and/or weight based adjustment of the mA/kV was utilized to reduce the radiation dose to as low as reasonably achievable. COMPARISON: Head CT 02/06/2022. CLINICAL HISTORY: Delirium. FINDINGS: BRAIN AND VENTRICLES: There is no evidence of an acute infarct, intracranial hemorrhage, mass, midline shift, hydrocephalus, or extra-axial fluid collection. There is mild cerebral atrophy. Patchy to confluent hypodensities in the cerebral white matter bilaterally are similar to the prior study and nonspecific but compatible with moderate chronic small vessel ischemic disease. Calcified atherosclerosis at the skull base. ORBITS: No acute abnormality. Bilateral cataract extraction. SINUSES: Mild mucosal thickening in the ethmoid sinuses. Clear mastoid air cells. SOFT TISSUES AND SKULL: No acute soft tissue abnormality. No skull fracture. IMPRESSION: 1. No acute intracranial abnormality. 2. Moderate chronic small vessel ischemic disease. Electronically signed by: Dasie Hamburg MD 10/17/2024 04:22 PM EST RP Workstation: [id number]   DG Chest Port 1 View Result Date: 10/17/2024 EXAM: 1 VIEW(S) XRAY OF THE CHEST 10/17/2024 01:00:00 PM COMPARISON: Comparison 02/22/2022. CLINICAL HISTORY: Questionable sepsis - evaluate for abnormality. FINDINGS: LUNGS AND PLEURA: No focal pulmonary opacity. No pleural effusion. No pneumothorax. HEART AND MEDIASTINUM: Stable cardiomediastinal silhouette. BONES AND SOFT TISSUES: Status post right shoulder arthroplasty. No acute osseous abnormality. IMPRESSION: 1. No acute cardiopulmonary process. Electronically signed by: Lynwood Seip MD 10/17/2024 01:32 PM EST RP Workstation: [id number]    Assessment and Plan:   83 y.o. 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 Dennise.  NSTEMI Presented for a fall at home and found to be septic (WBC 24, tachypneic, tachycardic) presumed from lower extremity SSTI. Not initially complaining of SOB or CP on admission. Received 2L IVF per sepsis protocol. Overnight, complaining of acute SOB, associated with  a new troponin elevation of 4.2k and BNP of 2.2k (prior troponins in 2022 were 100s). She has no known CAD with a low-risk NM MPI study back in 2022, however has risk factors including HTN and RA. She has been sundowning overnight needing ativan  for her hyperactive delirium/agitation so difficult to ascertain symptoms at this time, but as best as I can tell she is not currently endorsing chest pain. She is however showing increased work of breathing and interval CXR showing increased perihilar opacities consistent with edema, for which she was given IV lasix  20mg . While I suspect this is a type 2 NSTEMI in the setting of sepsis, unable to rule out a type 1 NSTEMI precipitated by sepsis at this point. Given no contraindications against anticoagulation (CT head after her fall benign), it is reasonable to treat as ACS with heparin  and ASA loading while we continue to trend troponins to assess for dynamic changes. - continue heparin  gtt for now - s/p ASA 324mg  PO load, continue ASA 81mg  every day - TTE in the morning to assess for new WMA  - continue to trend serial troponins and EKGs until troponin peaks to characterize rise - given these elevated troponins, after she has recovered from sepsis, would benefit from stress testing in outpatient setting to assess for coronary ischemia (prior stress testing in 2022 with 1mm horizontal STD with stress but otherwise  low-risk study)  Acute respiratory failure 2/2 heart failure exacerbation TTE in 2023 with grade II diastolic dysfunction. She appears volume overloaded currently and is getting diuresis. Will need to balance fluids for sepsis without fluid overloading her. Will repeat TTE as above to assess for any evidence of systolic dysfunction as well. - S/p IV lasix  20mg ; please keep strict I/O's, will follow-up with volume exam tomorrow to aid in additional diuresis if needed  Risk Assessment/Risk Scores:     TIMI Risk Score for Unstable Angina or Non-ST Elevation MI:   The patient's TIMI risk score is 4, which indicates a 20% risk of all cause mortality, new or recurrent myocardial infarction or need for urgent revascularization in the next 14 days.     For questions or updates, please contact [facility] Please consult [email or web address] for contact info under    Signed, Curtistine LITTIE Farr, MD  10/18/2024 12:46 AM

## 2024-10-17 NOTE — Significant Event (Signed)
 Patient's nurse notified me that patient became acutely short of breath.  On exam at bedside patient appears to be in respiratory distress and is on 6 L oxygen.  Denies any chest pain.  I reviewed patient's notes medications and labs.  Patient was admitted earlier for sepsis.  Blood pressure 140/80 pulse 102/min sinus tachycardia respiration 26/min oxygen saturation was 93% on 6 L. HEENT anicteric no pallor. Chest bilateral air entry present but appears rhonchorous and crepitation. Heart S1-S2 heard. Abdomen soft nontender. Extremities as noted has bilateral wounds and edema.  I ordered a stat x-ray which shows features concerning for edema.  Also ordered stat EKG which shows normal sinus rhythm with troponin coming back at 4000.  I given order of Lasix  20 mg IV.  I discussed with on-call cardiologist.  Assessment and plan -     Acute respiratory failure with hypoxia likely from fluid overload/CHF improved with Lasix  20 mg.  Patient also received 0.5 of IV Ativan  following which patient is more calm at this time.  Non-ST elevation MI -    discussed with on-call cardiologist Dr. Melia.  Requested patient be started on heparin  infusion and aspirin .  Checking 2D echo trend cardiac markers.   Redia Cleaver MD

## 2024-10-17 NOTE — ED Notes (Signed)
 This RN notified attending Franky MD of critical troponin 4,245ng/l, ordered to obtain EKG.

## 2024-10-17 NOTE — ED Notes (Signed)
 Lab able to add 20 pathogens respiratory panel to previously collected.

## 2024-10-17 NOTE — ED Notes (Signed)
Hospitalist to bedside.

## 2024-10-17 NOTE — ED Notes (Signed)
 Pt is very restless, tachycardic, anxious, reporting SHOB, wheezing, attempting to get out of bed,  This RN contacted attending. Awaiting orders.

## 2024-10-18 ENCOUNTER — Inpatient Hospital Stay (HOSPITAL_COMMUNITY)

## 2024-10-18 ENCOUNTER — Telehealth: Payer: Self-pay

## 2024-10-18 DIAGNOSIS — I214 Non-ST elevation (NSTEMI) myocardial infarction: Secondary | ICD-10-CM | POA: Diagnosis not present

## 2024-10-18 DIAGNOSIS — R7989 Other specified abnormal findings of blood chemistry: Secondary | ICD-10-CM

## 2024-10-18 LAB — COMPREHENSIVE METABOLIC PANEL WITH GFR
ALT: 82 U/L — ABNORMAL HIGH (ref 0–44)
AST: 99 U/L — ABNORMAL HIGH (ref 15–41)
Albumin: 2.4 g/dL — ABNORMAL LOW (ref 3.5–5.0)
Alkaline Phosphatase: 98 U/L (ref 38–126)
Anion gap: 10 (ref 5–15)
BUN: 16 mg/dL (ref 8–23)
CO2: 25 mmol/L (ref 22–32)
Calcium: 8.1 mg/dL — ABNORMAL LOW (ref 8.9–10.3)
Chloride: 105 mmol/L (ref 98–111)
Creatinine, Ser: 0.96 mg/dL (ref 0.44–1.00)
GFR, Estimated: 58 mL/min — ABNORMAL LOW (ref >60)
Glucose, Bld: 137 mg/dL — ABNORMAL HIGH (ref 70–99)
Potassium: 3.6 mmol/L (ref 3.5–5.1)
Sodium: 140 mmol/L (ref 135–145)
Total Bilirubin: 1 mg/dL (ref 0.0–1.2)
Total Protein: 4.8 g/dL — ABNORMAL LOW (ref 6.5–8.1)

## 2024-10-18 LAB — CBC WITH DIFFERENTIAL/PLATELET
Abs Immature Granulocytes: 0.21 K/uL — ABNORMAL HIGH (ref 0.00–0.07)
Basophils Absolute: 0 K/uL (ref 0.0–0.1)
Basophils Relative: 0 %
Eosinophils Absolute: 0 K/uL (ref 0.0–0.5)
Eosinophils Relative: 0 %
HCT: 33.3 % — ABNORMAL LOW (ref 36.0–46.0)
Hemoglobin: 10.7 g/dL — ABNORMAL LOW (ref 12.0–15.0)
Immature Granulocytes: 1 %
Lymphocytes Relative: 2 %
Lymphs Abs: 0.4 K/uL — ABNORMAL LOW (ref 0.7–4.0)
MCH: 28.2 pg (ref 26.0–34.0)
MCHC: 32.1 g/dL (ref 30.0–36.0)
MCV: 87.6 fL (ref 80.0–100.0)
Monocytes Absolute: 0.3 K/uL (ref 0.1–1.0)
Monocytes Relative: 2 %
Neutro Abs: 15.1 K/uL — ABNORMAL HIGH (ref 1.7–7.7)
Neutrophils Relative %: 95 %
Platelets: 225 K/uL (ref 150–400)
RBC: 3.8 MIL/uL — ABNORMAL LOW (ref 3.87–5.11)
RDW: 14.2 % (ref 11.5–15.5)
WBC: 16 K/uL — ABNORMAL HIGH (ref 4.0–10.5)
nRBC: 0 % (ref 0.0–0.2)

## 2024-10-18 LAB — ECHOCARDIOGRAM COMPLETE
AR max vel: 1.51 cm2
AV Area VTI: 1.5 cm2
AV Area mean vel: 1.47 cm2
AV Mean grad: 28 mmHg
AV Peak grad: 48.2 mmHg
Ao pk vel: 3.47 m/s
Area-P 1/2: 3.95 cm2
Height: 62 in
MV VTI: 3.27 cm2
S' Lateral: 2.5 cm
Weight: 1632 [oz_av]

## 2024-10-18 LAB — PROCALCITONIN
Procalcitonin: 17.4 ng/mL
Procalcitonin: 21.5 ng/mL

## 2024-10-18 LAB — LACTIC ACID, PLASMA: Lactic Acid, Venous: 1.5 mmol/L (ref 0.5–1.9)

## 2024-10-18 LAB — C DIFFICILE QUICK SCREEN W PCR REFLEX
C Diff antigen: POSITIVE — AB
C Diff toxin: NEGATIVE

## 2024-10-18 LAB — HEPARIN LEVEL (UNFRACTIONATED)
Heparin Unfractionated: <0.1 [IU]/mL — ABNORMAL LOW (ref 0.30–0.70)
Heparin Unfractionated: 0.13 [IU]/mL — ABNORMAL LOW (ref 0.30–0.70)

## 2024-10-18 LAB — CLOSTRIDIUM DIFFICILE BY PCR, REFLEXED
Hypervirulent Strain: NEGATIVE
Toxigenic C. Difficile by PCR: NEGATIVE

## 2024-10-18 LAB — MRSA NEXT GEN BY PCR, NASAL: MRSA by PCR Next Gen: NOT DETECTED

## 2024-10-18 LAB — TROPONIN I (HIGH SENSITIVITY): Troponin I (High Sensitivity): 4193 ng/L (ref <18)

## 2024-10-18 LAB — MAGNESIUM: Magnesium: 1.6 mg/dL — ABNORMAL LOW (ref 1.7–2.4)

## 2024-10-18 LAB — C-REACTIVE PROTEIN: CRP: 17.5 mg/dL — ABNORMAL HIGH (ref <1.0)

## 2024-10-18 LAB — PHOSPHORUS: Phosphorus: 3.1 mg/dL (ref 2.5–4.6)

## 2024-10-18 LAB — CK: Total CK: 1064 U/L — ABNORMAL HIGH (ref 38–234)

## 2024-10-18 LAB — BRAIN NATRIURETIC PEPTIDE: B Natriuretic Peptide: 2245.5 pg/mL — ABNORMAL HIGH (ref 0.0–100.0)

## 2024-10-18 MED ORDER — HYDROCORTISONE SOD SUC (PF) 100 MG IJ SOLR
50.0000 mg | Freq: Two times a day (BID) | INTRAMUSCULAR | Status: DC
  Administered 2024-10-19: 50 mg via INTRAVENOUS
  Filled 2024-10-18: qty 1

## 2024-10-18 MED ORDER — MAGNESIUM SULFATE 4 GM/100ML IV SOLN
4.0000 g | Freq: Once | INTRAVENOUS | Status: AC
  Administered 2024-10-18: 4 g via INTRAVENOUS
  Filled 2024-10-18: qty 100

## 2024-10-18 MED ORDER — HEPARIN (PORCINE) 25000 UT/250ML-% IV SOLN
850.0000 [IU]/h | INTRAVENOUS | Status: DC
  Administered 2024-10-18: 550 [IU]/h via INTRAVENOUS
  Administered 2024-10-19: 850 [IU]/h via INTRAVENOUS
  Filled 2024-10-18 (×2): qty 250

## 2024-10-18 MED ORDER — HEPARIN BOLUS VIA INFUSION
2000.0000 [IU] | Freq: Once | INTRAVENOUS | Status: AC
  Administered 2024-10-18: 2000 [IU] via INTRAVENOUS
  Filled 2024-10-18: qty 2000

## 2024-10-18 NOTE — ED Notes (Addendum)
 Unable to draw blood from either IV. Phleb notified.

## 2024-10-18 NOTE — Evaluation (Signed)
 Physical Therapy Evaluation Patient Details Name: Isabel Barnes MRN: [id number] DOB: Jun 26, 1941 Today's Date: 10/18/2024  History of Present Illness  Pt is an 83 y.o. female who presented 10/17/24 with generalized weakness, x2 falls, low-grade fevers, and worsening pain in her R leg with swelling and redness. Admitted with R lower extremity cellulitis and sepsis. Pt became acutely SOB 11/19 in ED and found to be in respiratory distress with troponin 4200, NSTEMI. 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 PTA, she was mod I using a rollator for functional mobility, living alone in a 1-level house with a ramped entrance. Currently, the pt is demonstrating deficits in generalized strength, power, balance, and activity tolerance. She is also limited by R lower extremity pain. Currently, she is requiring mod-total assist for bed mobility (would likely do better on her own bed since her short stature makes it difficult to enter/exit the tall stretchers), minA for transfers, and CGA-minA to ambulate household distances with a RW. She is at risk for further falls and only has PRN assistance available to her at d/c. Considering these factors, she would likely benefit from short-term inpatient rehab, < 3 hours/day, prior to d/c home to maximize her safety and independence with all functional mobility. If the pt makes quick great progress, then she may be able to d/c home with HHPT. Will continue to follow acutely and assess d/c needs.      If plan is discharge home, recommend the following: A little help with walking and/or transfers;A little help with bathing/dressing/bathroom;Assistance with cooking/housework;Assist for transportation;Help with stairs or ramp  for entrance   Can travel by private vehicle   Yes    Equipment Recommendations None recommended by PT  Recommendations for Other Services       Functional Status Assessment Patient has had a recent decline in their functional status and demonstrates the ability to make significant improvements in function in a reasonable and predictable amount of time.     Precautions / Restrictions Precautions Precautions: Fall;Other (comment) Recall of Precautions/Restrictions: Intact Precaution/Restrictions Comments: watch SpO2 Restrictions Weight Bearing Restrictions Per Provider Order: No      Mobility  Bed Mobility Overal bed mobility: Needs Assistance Bed Mobility: Supine to Sit, Sit to Supine     Supine to sit: Mod assist, Used rails Sit to supine: Total assist   General bed mobility comments: Pt exited off the end of the stretcher due to having slid so far inferiorly in the bed already. Cued pt to pull up on bil rails to scoot to end of bed and ascend trunk, modA to ascend trunk. Due to pt's short stature and the tall edge of stretcher, she needed total assist to safely return to supine and prevent her from sliding off the edge of the stretcher    Transfers Overall transfer level: Needs assistance Equipment used: Rolling walker (2 wheels) Transfers: Sit to/from Stand Sit to Stand: Min assist           General transfer comment: Pt stood from the end of the stretcher with minA to power up and gain balance.    Ambulation/Gait Ambulation/Gait assistance: Contact guard assist, Min assist Gait Distance (Feet): 70 Feet Assistive device: Rolling walker (2 wheels) Gait Pattern/deviations: Step-through pattern, Decreased step length - right, Decreased step length -  left, Decreased stride length, Trunk flexed Gait velocity: reduced Gait velocity interpretation: <1.31 ft/sec, indicative of household ambulator   General Gait Details: Pt ambulates with slow, small steps and  forearms resting on RW grips intermittently. Cues provided to stand upright with min success noted. No LOB, but unsteadiness noted, CGA-minA for balance and safety  Stairs            Wheelchair Mobility     Tilt Bed    Modified Rankin (Stroke Patients Only)       Balance Overall balance assessment: Needs assistance Sitting-balance support: Feet supported, Single extremity supported, Bilateral upper extremity supported Sitting balance-Leahy Scale: Poor Sitting balance - Comments: UE support sitting statically edge of stretcher (stretcher was high for her tall stature, impacting her balance)   Standing balance support: Bilateral upper extremity supported, During functional activity, Reliant on assistive device for balance Standing balance-Leahy Scale: Poor Standing balance comment: reliant on RW and intermittent minA                             Pertinent Vitals/Pain Pain Assessment Pain Assessment: Faces Faces Pain Scale: Hurts little more Pain Location: bil legs (R>L) Pain Descriptors / Indicators: Discomfort, Grimacing, Guarding, Sore Pain Intervention(s): Limited activity within patient's tolerance, Monitored during session, Repositioned    Home Living Family/patient expects to be discharged to:: Private residence Living Arrangements: Alone Available Help at Discharge: Personal care attendant;Friend(s);Available PRN/intermittently;Neighbor (PCA comes 3-4 hours/day 3-4 days/week) Type of Home: House Home Access: Ramped entrance       Home Layout: One level Home Equipment: Agricultural Consultant (2 wheels);Rollator (4 wheels);BSC/3in1;Shower seat - built in;Shower seat;Grab bars - tub/shower;Grab bars - toilet;Transport chair      Prior Function Prior Level of Function : History of Falls (last six months);Needs assist             Mobility Comments: Mod I with rollator; x2 falls in past 6 months ADLs Comments: Ind for ADLs, does not shower unless PCA is  there to supervise, assist for iADLs; does not drive     Extremity/Trunk Assessment   Upper Extremity Assessment Upper Extremity Assessment: Defer to OT evaluation    Lower Extremity Assessment Lower Extremity Assessment: Generalized weakness;RLE deficits/detail RLE Deficits / Details: erythema and edema noted in lower leg with bandages, hx of lymphedema       Communication   Communication Communication: No apparent difficulties    Cognition Arousal: Alert Behavior During Therapy: WFL for tasks assessed/performed   PT - Cognitive impairments: No apparent impairments                         Following commands: Intact       Cueing Cueing Techniques: Verbal cues, Tactile cues     General Comments General comments (skin integrity, edema, etc.): SpO2 100% on 5L resting and still 100% on 2L resting, dropped pt to 2L end of session and notified RN, seemed to have VSS on RA when ambulating    Exercises     Assessment/Plan    PT Assessment Patient needs continued PT services  PT Problem List Decreased strength;Decreased activity tolerance;Decreased balance;Decreased mobility;Cardiopulmonary status limiting activity;Decreased skin integrity;Pain       PT Treatment Interventions DME instruction;Gait training;Functional mobility training;Therapeutic activities;Therapeutic exercise;Balance training;Neuromuscular re-education;Patient/family education    PT Goals (Current goals can be found in the Care Plan section)  Acute Rehab PT Goals Patient Stated Goal:  to improve PT Goal Formulation: With patient Time For Goal Achievement: 11/01/24 Potential to Achieve Goals: Good    Frequency Min 3X/week     Co-evaluation               AM-PAC PT 6 Clicks Mobility  Outcome Measure Help needed turning from your back to your side while in a flat bed without using bedrails?: A Little Help needed moving from lying on your back to sitting on the side of a flat bed  without using bedrails?: A Lot Help needed moving to and from a bed to a chair (including a wheelchair)?: A Little Help needed standing up from a chair using your arms (e.g., wheelchair or bedside chair)?: A Little Help needed to walk in hospital room?: A Little Help needed climbing 3-5 steps with a railing? : Total 6 Click Score: 15    End of Session Equipment Utilized During Treatment: Gait belt Activity Tolerance: Patient tolerated treatment well Patient left: in bed;with call bell/phone within reach Nurse Communication: Other (comment) (SpO2 100% on 2L end of session) PT Visit Diagnosis: Unsteadiness on feet (R26.81);Other abnormalities of gait and mobility (R26.89);Muscle weakness (generalized) (M62.81);Repeated falls (R29.6);History of falling (Z91.81);Difficulty in walking, not elsewhere classified (R26.2);Pain Pain - Right/Left: Right Pain - part of body: Leg    Time: 1000-1040 PT Time Calculation (min) (ACUTE ONLY): 40 min   Charges:   PT Evaluation $PT Eval Moderate Complexity: 1 Mod PT Treatments $Gait Training: 8-22 mins $Therapeutic Activity: 8-22 mins PT General Charges $$ ACUTE PT VISIT: 1 Visit         Theo Ferretti, PT, DPT Acute Rehabilitation Services  Office: [phone]   Theo CHRISTELLA Ferretti 10/18/2024, 1:52 PM

## 2024-10-18 NOTE — Progress Notes (Signed)
 Progress Note  Patient Name: Isabel Barnes Date of Encounter: 10/18/2024  Primary Cardiologist: Deatrice Cage, MD   Subjective   Acutely short of breath overnight found to be in respiratory distress on 6 L oxygen.  CXR concerning for edema.  Given Lasix  20 mg IV case was discussed with cardiology prompting the consult.  Currently complaining of neck pain. Still a little short of breath. Denies any chest pain. Echo at bedside.  Inpatient Medications    Scheduled Meds:  aspirin  EC  81 mg Oral Daily   cycloSPORINE   1 drop Both Eyes Daily   hydrocortisone  sod succinate (SOLU-CORTEF ) inj  100 mg Intravenous Q8H   hydrOXYzine  25 mg Oral Once   Continuous Infusions:  heparin  550 Units/hr (10/18/24 0321)   linezolid (ZYVOX) IV 600 mg (10/18/24 9352)   piperacillin -tazobactam (ZOSYN )  IV 3.375 g (10/18/24 0410)   PRN Meds: acetaminophen  **OR** acetaminophen , HYDROcodone -acetaminophen , naphazoline-glycerin, nitroGLYCERIN , ondansetron  **OR** ondansetron  (ZOFRAN ) IV, triamcinolone    Vital Signs    Vitals:   10/18/24 0500 10/18/24 0515 10/18/24 0700 10/18/24 0715  BP: 135/64 127/68 131/69 127/65  Pulse: 78 79 79 77  Resp: 17 (!) 23 18 19   Temp:      TempSrc:      SpO2: 100% 100% 100% 100%  Weight:      Height:        Intake/Output Summary (Last 24 hours) at 10/18/2024 0726 Last data filed at 10/18/2024 0321 Gross per 24 hour  Intake 36.56 ml  Output --  Net 36.56 ml   Filed Weights   10/17/24 1129  Weight: 46.3 kg    Telemetry    Sinus rhythm with PACs - Personally Reviewed  ECG    Normal sinus rhythm - Personally Reviewed  Physical Exam   Physical Exam Vitals and nursing note reviewed.  Constitutional:      Appearance: Normal appearance.  HENT:     Head: Normocephalic and atraumatic.  Eyes:     Conjunctiva/sclera: Conjunctivae normal.  Cardiovascular:     Rate and Rhythm: Normal rate and regular rhythm.     Heart sounds: Murmur heard.   Pulmonary:     Effort: Pulmonary effort is normal.     Breath sounds: Normal breath sounds.  Musculoskeletal:     Comments: Wraps on bilateral legs.   Skin:    Coloration: Skin is not jaundiced or pale.  Neurological:     Mental Status: She is alert.      Labs    Chemistry Recent Labs  Lab 10/17/24 1204 10/17/24 2259 10/18/24 0443  NA 137  --  140  K 3.9  --  3.6  CL 101  --  105  CO2 22  --  25  GLUCOSE 129*  --  137*  BUN 15  --  16  CREATININE 0.96 0.93 0.96  CALCIUM  8.7*  --  8.1*  PROT 6.3*  --  4.8*  ALBUMIN  3.4*  --  2.4*  AST 92*  --  99*  ALT 113*  --  82*  ALKPHOS 138*  --  98  BILITOT 1.1  --  1.0  GFRNONAA 58* >60 58*  ANIONGAP 14  --  10     Hematology Recent Labs  Lab 10/17/24 1204 10/17/24 2259 10/18/24 0443  WBC 24.2* 20.1* 16.0*  RBC 4.12 4.21 3.80*  HGB 11.6* 11.7* 10.7*  HCT 36.4 37.2 33.3*  MCV 88.3 88.4 87.6  MCH 28.2 27.8 28.2  MCHC 31.9 31.5 32.1  RDW 14.1 14.2 14.2  PLT 308 289 225    Cardiac EnzymesNo results for input(s): TROPONINI in the last 168 hours. No results for input(s): TROPIPOC in the last 168 hours.   BNP Recent Labs  Lab 10/17/24 2300  BNP 2,245.5*     DDimer No results for input(s): DDIMER in the last 168 hours.   Radiology    DG Chest Port 1 View Result Date: 10/17/2024 EXAM: 1 VIEW(S) XRAY OF THE CHEST 10/17/2024 10:34:00 PM COMPARISON: 10/17/2024 CLINICAL HISTORY: SOB (shortness of breath) FINDINGS: LUNGS AND PLEURA: Increased bilateral perihilar interstitial and airspace opacities. No pleural effusion. No pneumothorax. Limited evaluation due to rotation. HEART AND MEDIASTINUM: Cardiomegaly. BONES AND SOFT TISSUES: Right shoulder arthroplasty. ACDF noted. No acute osseous abnormality. IMPRESSION: 1. Increased bilateral perihilar interstitial and airspace opacities. Limited evaluation due to rotation. 2. Cardiomegaly. Electronically signed by: Morgane Naveau MD 10/17/2024 10:58 PM EST RP  Workstation: [id number]   CT Cervical Spine Wo Contrast Result Date: 10/17/2024 EXAM: CT CERVICAL SPINE WITHOUT CONTRAST 10/17/2024 04:16:12 PM TECHNIQUE: CT of the cervical spine was performed without the administration of intravenous contrast. Multiplanar reformatted images are provided for review. Automated exposure control, iterative reconstruction, and/or weight based adjustment of the mA/kV was utilized to reduce the radiation dose to as low as reasonably achievable. COMPARISON: CT cervical spine 04/25/2020. CLINICAL HISTORY: Facial trauma, blunt. Fall. FINDINGS: CERVICAL SPINE: BONES AND ALIGNMENT: No acute fracture. Chronic grade 1 anterolisthesis of C4 on C5, C5 on C6, and C7 on T1. 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 Large chronic cyst or erosion involving the dens. Slight interval widening of the atlantodental interval measuring 2 mm and attributed to progressive arthropathy. Suspected partial fusion across the left lateral C1-C2 articulation. Progressive left atlantooccipital arthropathy. Previous C3-C4 and C6-C7 ACDF. DEGENERATIVE CHANGES: Widespread facet arthrosis with multilevel facet ankylosis. Partially calcified ligamentous thickening/pannus posterior to the dens results in mild to moderate spinal stenosis. SOFT TISSUES: No prevertebral soft tissue swelling. LUNGS: Partially visualized extensive pulmonary ground glass opacities in the right greater than left upper lobes. IMPRESSION: 1. No acute cervical spine fracture. 2. 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 3. Partially visualized bilateral pulmonary ground-glass opacities, possibly reflecting edema or pneumonia (including viral pneumonia). Electronically signed by: Dasie Hamburg MD 10/17/2024 04:39 PM EST RP Workstation: [id number]   CT Head Wo Contrast Result Date: 10/17/2024 EXAM: CT HEAD WITHOUT  CONTRAST 10/17/2024 04:16:12 PM TECHNIQUE: CT of the head was performed without the administration of intravenous contrast. Automated exposure control, iterative reconstruction, and/or weight based adjustment of the mA/kV was utilized to reduce the radiation dose to as low as reasonably achievable. COMPARISON: Head CT 02/06/2022. CLINICAL HISTORY: Delirium. FINDINGS: BRAIN AND VENTRICLES: There is no evidence of an acute infarct, intracranial hemorrhage, mass, midline shift, hydrocephalus, or extra-axial fluid collection. There is mild cerebral atrophy. Patchy to confluent hypodensities in the cerebral white matter bilaterally are similar to the prior study and nonspecific but compatible with moderate chronic small vessel ischemic disease. Calcified atherosclerosis at the skull base. ORBITS: No acute abnormality. Bilateral cataract extraction. SINUSES: Mild mucosal thickening in the ethmoid sinuses. Clear mastoid air cells. SOFT TISSUES AND SKULL: No acute soft tissue abnormality. No skull fracture. IMPRESSION: 1. No acute intracranial abnormality. 2. Moderate chronic small vessel ischemic disease. Electronically signed by: Dasie Hamburg MD 10/17/2024 04:22 PM EST RP Workstation: [id number]   DG Chest Port 1 View Result Date:  10/17/2024 EXAM: 1 VIEW(S) XRAY OF THE CHEST 10/17/2024 01:00:00 PM COMPARISON: Comparison 02/22/2022. CLINICAL HISTORY: Questionable sepsis - evaluate for abnormality. FINDINGS: LUNGS AND PLEURA: No focal pulmonary opacity. No pleural effusion. No pneumothorax. HEART AND MEDIASTINUM: Stable cardiomediastinal silhouette. BONES AND SOFT TISSUES: Status post right shoulder arthroplasty. No acute osseous abnormality. IMPRESSION: 1. No acute cardiopulmonary process. Electronically signed by: Lynwood Seip MD 10/17/2024 01:32 PM EST RP Workstation: [id number]    Cardiac Studies    TTE 02/08/22:   1. Left ventricular ejection fraction, by estimation, is 60 to 65%. The  left ventricle has  normal function. The left ventricle has no regional  wall motion abnormalities. Left ventricular diastolic parameters are  consistent with Grade II diastolic  dysfunction (pseudonormalization). Elevated left ventricular end-diastolic  pressure.   2. Right ventricular systolic function is normal. The right ventricular  size is normal. There is mildly elevated pulmonary artery systolic  pressure. The estimated right ventricular systolic pressure is 39.6 mmHg.   3. Left atrial size was moderately dilated.   4. The pericardial effusion is anterior to the right ventricle and  surrounding the apex. Moderate pleural effusion in the left lateral  region.   5. The mitral valve is normal in structure. Mild mitral valve  regurgitation. No evidence of mitral stenosis. Severe mitral annular  calcification.   6. The aortic valve is normal in structure. There is severe calcifcation  of the aortic valve. There is severe thickening of the aortic valve.  Aortic valve regurgitation is not visualized. Moderate aortic valve  stenosis. Aortic valve area, by VTI  measures 1.67 cm. Aortic valve mean gradient measures 24.0 mmHg. Aortic  valve Vmax measures 3.30 m/s.   7. The inferior vena cava is dilated in size with >50% respiratory  variability, suggesting right atrial pressure of 8 mmHg.   8. Compared to echo dated 03/17/2021, the mean AVG has decreased from  to , AV VTi has decreased from 3.22m/s to 3.3 m/s and DVI has  increased from 0.28 to 0.44.   NM SPECT 03/17/21: ST segment depression was noted during stress in the II, III, aVF, V4 and V5 leads. No T wave inversion was noted during stress. This is a low risk study. The left ventricular ejection fraction is hyperdynamic (>65%).  Patient Profile     83 y.o. female with a past medical history of lymphedema R greater than L, moderate aortic stenosis, chronic lower extremity wounds, rheumatoid arthritis on steroids, hypothyroidism,  hypertension, anemia of chronic disease, right calcaneus osteomyelitis from cardiology was consulted on 10/17/2024 at the request of Dr. Dennise for evaluation of NSTEMI.  Patient suffered a fall and was brought to the ED via EMS and found to be septic lower extremity cellulitis and admitted to medicine.  She was given IV fluids per sepsis protocol and broad-spectrum antibiotics.  She became short of breath and found to have interstitial opacities and therefore IV Lasix  was given.  Troponin was elevated at 4200.  She was given aspirin  and heparin  was started and cardiology was consulted.  Assessment & Plan   Sepsis secondary to lower extremity cellulitis  Elevated troponin, presumably due to and ischemia and likely acute diastolic heart failure-4245-> 5806.  ECG shows normal sinus rhythm with nonspecific ST changes low risk SPECT stress with borderline TID without qualitative dilatation.  Currently on heparin  and aspirin . Suspected preserved EF and at least mild mitral stenosis and at least moderate to severe aortic stenosis by echo at bedside by very  limited images at the time. Full echo pending.  Acute respiratory failure suspected secondary - on nasal canula.  Moderate aortic stenosis - noted on TTE 2023 Hypomagnesemia - per primary team Rheumatoid arthritis Hypertension Hyperlipidemia  Time spent coordinating care: 38  minutes     For questions or updates, please contact [facility] Please consult [email or web address] for contact info under        Signed, Emeline Calender, DO 10/18/2024, 7:26 AM

## 2024-10-18 NOTE — Progress Notes (Signed)
 PHARMACY - ANTICOAGULATION CONSULT NOTE  Pharmacy Consult for heparin  Indication: chest pain/ACS  Allergies  Allergen Reactions   Dilaudid  [Hydromorphone  Hcl] Shortness Of Breath   Gabapentin Other (See Comments)    Hoarseness , headache and sore throat   Latex Rash    Severe rash   Lyrica [Pregabalin] Other (See Comments)    No balance , had to walk with cane , Blurred vision,weakness.   Oxycodone  Shortness Of Breath and Other (See Comments)    Tolerated oxycodone  during admission to hospital 02/06/22   Singulair [Montelukast] Shortness Of Breath and Other (See Comments)    Vision issues, also   Ciprofloxacin Hcl Nausea And Vomiting and Other (See Comments)    Nausea and vomiting with by mouth form   Codeine Other (See Comments)    Hallucinations   Methadone Nausea And Vomiting and Other (See Comments)    Severe nausea and vomiting   Metronidazole  Nausea And Vomiting and Other (See Comments)    Gastric pain   Oysters [Shellfish Allergy] Other (See Comments)    Terrible gastric upset and cramping.   Clindamycin /Lincomycin Diarrhea and Nausea Only   Donepezil Diarrhea and Other (See Comments)    Severe diarrhea   Ferrous Sulfate      Severe gi upset, will get an infusion when needed per caregiver   Sulfa Antibiotics Diarrhea and Other (See Comments)    GI issues    Tape Other (See Comments)    ADHESIVE TAPE-Severe rash   Zetia [Ezetimibe] Diarrhea   Elemental Sulfur Nausea And Vomiting   Iodine  Rash    Duplicate entry   Other Rash    All Antibiotic ointments/ creams   Oyster Shell Rash   Povidone Iodine  Rash and Other (See Comments)    Oyster shell products- Rash    Povidone-Iodine      Duplicate entry   Skintegrity Hydrogel [Skin Protectants, Misc.] Rash   Tapentadol Other (See Comments)    Nightmares **Nucynta**    Patient Measurements: Height: 5' 2 (157.5 cm) Weight: 46.3 kg (102 lb) IBW/kg (Calculated) : 50.1 HEPARIN  DW (KG): 46.3  Vital Signs: Temp:  98.1 F (36.7 C) (11/20 1130) Temp Source: Oral (11/20 1130) BP: 142/69 (11/20 1245) Pulse Rate: 83 (11/20 1245)  Labs: Recent Labs    10/17/24 1204 10/17/24 2259 10/17/24 2300 10/18/24 0053 10/18/24 0443 10/18/24 1230  HGB 11.6* 11.7*  --   --  10.7*  --   HCT 36.4 37.2  --   --  33.3*  --   PLT 308 289  --   --  225  --   LABPROT 14.2  --   --   --   --   --   INR 1.0  --   --   --   --   --   HEPARINUNFRC  --   --   --   --   --  <0.10*  CREATININE 0.96 0.93  --   --  0.96  --   CKTOTAL 346*  --   --   --  1,064*  --   TROPONINIHS  --   --  4,245* 4,193*  --   --     Estimated Creatinine Clearance: 31.3 mL/min (by C-G formula based on SCr of 0.96 mg/dL).   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   Depression    GERD (gastroesophageal reflux disease)    takes Omeprazole daily   Heart murmur    mild MS, moderate-severe AS 03/17/21 echo   History of bronchitis    20+ yrs ago   History of kidney stones    3 surgerical removed, 1 passed   History of prolapse of bladder    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 Albuterol  inhaler as  needed   Spinal headache 1991   blood patch placed   Spondylitis    Unsteady gait    occasionally   Urinary urgency     Assessment: 83yo female admitted for sepsis, while awaiting bed in ED became acutely SOB with tacycardia and troponin found to be significantly elevated, to begin heparin .  Initial heparin  level undetectable on 550 units/hr  Goal of Therapy:  Heparin  level 0.3-0.7 units/ml Monitor platelets by anticoagulation protocol: Yes   Plan:  Increase heparin  gtt to 700 units/hr F/u 8 hour heparin  level and AC plan  Dorn Poot, PharmD, [facility] Clinical Pharmacist ED Pharmacist Phone # [phone] 10/18/2024 1:37 PM

## 2024-10-18 NOTE — Progress Notes (Signed)
 PHARMACY - ANTICOAGULATION CONSULT NOTE  Pharmacy Consult for heparin  Indication: chest pain/ACS  Allergies  Allergen Reactions   Dilaudid  [Hydromorphone  Hcl] Shortness Of Breath   Gabapentin Other (See Comments)    Hoarseness , headache and sore throat   Latex Rash    Severe rash   Lyrica [Pregabalin] Other (See Comments)    No balance , had to walk with cane , Blurred vision,weakness.   Oxycodone  Shortness Of Breath and Other (See Comments)    Tolerated oxycodone  during admission to hospital 02/06/22   Singulair [Montelukast] Shortness Of Breath and Other (See Comments)    Vision issues, also   Ciprofloxacin Hcl Nausea And Vomiting and Other (See Comments)    Nausea and vomiting with by mouth form   Codeine Other (See Comments)    Hallucinations   Methadone Nausea And Vomiting and Other (See Comments)    Severe nausea and vomiting   Metronidazole  Nausea And Vomiting and Other (See Comments)    Gastric pain   Oysters [Shellfish Allergy] Other (See Comments)    Terrible gastric upset and cramping.   Clindamycin /Lincomycin Diarrhea and Nausea Only   Donepezil Diarrhea and Other (See Comments)    Severe diarrhea   Ferrous Sulfate      Severe gi upset, will get an infusion when needed per caregiver   Sulfa Antibiotics Diarrhea and Other (See Comments)    GI issues    Tape Other (See Comments)    ADHESIVE TAPE-Severe rash   Zetia [Ezetimibe] Diarrhea   Elemental Sulfur Nausea And Vomiting   Iodine  Rash    Duplicate entry   Other Rash    All Antibiotic ointments/ creams   Oyster Shell Rash   Povidone Iodine  Rash and Other (See Comments)    Oyster shell products- Rash    Povidone-Iodine      Duplicate entry   Skintegrity Hydrogel [Skin Protectants, Misc.] Rash   Tapentadol Other (See Comments)    Nightmares **Nucynta**    Patient Measurements: Height: 5' 2 (157.5 cm) Weight: 46.3 kg (102 lb) IBW/kg (Calculated) : 50.1 HEPARIN  DW (KG): 46.3  Vital Signs: Temp:  98 F (36.7 C) (11/20 2000) Temp Source: Oral (11/20 2000) BP: 122/59 (11/20 2000) Pulse Rate: 83 (11/20 2000)  Labs: Recent Labs    10/17/24 1204 10/17/24 2259 10/17/24 2300 10/18/24 0053 10/18/24 0443 10/18/24 1230 10/18/24 2121  HGB 11.6* 11.7*  --   --  10.7*  --   --   HCT 36.4 37.2  --   --  33.3*  --   --   PLT 308 289  --   --  225  --   --   LABPROT 14.2  --   --   --   --   --   --   INR 1.0  --   --   --   --   --   --   HEPARINUNFRC  --   --   --   --   --  <0.10* 0.13*  CREATININE 0.96 0.93  --   --  0.96  --   --   CKTOTAL 346*  --   --   --  1,064*  --   --   TROPONINIHS  --   --  4,245* 4,193*  --   --   --     Estimated Creatinine Clearance: 31.3 mL/min (by C-G formula based on SCr of 0.96 mg/dL).   Assessment: 83yo female admitted for sepsis, while awaiting bed in  ED became acutely SOB with tacycardia and troponin found to be significantly elevated, to begin heparin .  Heparin  level still low this PM at 0.13  Goal of Therapy:  Heparin  level 0.3-0.7 units/ml Monitor platelets by anticoagulation protocol: Yes   Plan:  Heparin  bolus 2000 units IV x 1 Increase heparin  to 850 units / hr 8 hour heparin  level  Thank you. Olam Monte, PharmD 10/18/2024 10:06 PM

## 2024-10-18 NOTE — ED Notes (Signed)
 Family at bedside.

## 2024-10-18 NOTE — ED Notes (Signed)
 Echo at bedside

## 2024-10-18 NOTE — Progress Notes (Signed)
  Echocardiogram 2D Echocardiogram has been performed.  Koleen KANDICE Popper, RDCS 10/18/2024, 8:46 AM

## 2024-10-18 NOTE — Telephone Encounter (Addendum)
 Received voicemail from patient's daughter wanting updates on patient's plan of care in the ED.   Called [place] back, she confirms she spoke with ED RN. She states she forgot to mention that she does not feel her mother is of sound mind to make treatment decisions. Offered to send message to patient's care team requesting that social worker or case manager reach out.   Message sent to [place], ED RN stating patient's daughter has concerns about patient's ability to make treatment decisions. Requested that ED have social worker or case manager contact [place] to discuss [place]'s care and hospitalization.   Isabel [place] lives in [place] and states her brother is in [place], so they do not have family that can be with [place] right now.  Spoke with Isabel, she will discuss with child psychotherapist.   [place], BSN, RN

## 2024-10-18 NOTE — Progress Notes (Signed)
 PHARMACY - ANTICOAGULATION CONSULT NOTE  Pharmacy Consult for heparin  Indication: chest pain/ACS  Allergies  Allergen Reactions   Dilaudid  [Hydromorphone  Hcl] Shortness Of Breath   Gabapentin Other (See Comments)    Hoarseness , headache and sore throat   Latex Rash    Severe rash   Lyrica [Pregabalin] Other (See Comments)    No balance , had to walk with cane , Blurred vision,weakness.   Oxycodone  Shortness Of Breath and Other (See Comments)    Tolerated oxycodone  during admission to hospital 02/06/22   Singulair [Montelukast] Shortness Of Breath and Other (See Comments)    Vision issues, also   Ciprofloxacin Hcl Nausea And Vomiting and Other (See Comments)    Nausea and vomiting with by mouth form   Codeine Other (See Comments)    Hallucinations   Methadone Nausea And Vomiting and Other (See Comments)    Severe nausea and vomiting   Metronidazole  Nausea And Vomiting and Other (See Comments)    Gastric pain   Oysters [Shellfish Allergy] Other (See Comments)    Terrible gastric upset and cramping.   Clindamycin /Lincomycin Diarrhea and Nausea Only   Donepezil Diarrhea and Other (See Comments)    Severe diarrhea   Ferrous Sulfate      Severe gi upset, will get an infusion when needed per caregiver   Sulfa Antibiotics Diarrhea and Other (See Comments)    GI issues    Tape Other (See Comments)    ADHESIVE TAPE-Severe rash   Zetia [Ezetimibe] Diarrhea   Elemental Sulfur Nausea And Vomiting   Iodine  Rash    Duplicate entry   Other Rash    All Antibiotic ointments/ creams   Oyster Shell Rash   Povidone Iodine  Rash and Other (See Comments)    Oyster shell products- Rash    Povidone-Iodine      Duplicate entry   Skintegrity Hydrogel [Skin Protectants, Misc.] Rash   Tapentadol Other (See Comments)    Nightmares **Nucynta**    Patient Measurements: Height: 5' 2 (157.5 cm) Weight: 46.3 kg (102 lb) IBW/kg (Calculated) : 50.1 HEPARIN  DW (KG): 46.3  Vital Signs: Temp:  97.9 F (36.6 C) (11/19 2353) Temp Source: Oral (11/19 2353) BP: 144/80 (11/19 2330) Pulse Rate: 101 (11/19 2330)  Labs: Recent Labs    10/17/24 1204 10/17/24 2259 10/17/24 2300  HGB 11.6* 11.7*  --   HCT 36.4 37.2  --   PLT 308 289  --   LABPROT 14.2  --   --   INR 1.0  --   --   CREATININE 0.96 0.93  --   CKTOTAL 346*  --   --   TROPONINIHS  --   --  4,245*    Estimated Creatinine Clearance: 32.3 mL/min (by C-G formula based on SCr of 0.93 mg/dL).   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   Depression    GERD (gastroesophageal reflux disease)    takes Omeprazole daily   Heart murmur    mild MS,  moderate-severe AS 03/17/21 echo   History of bronchitis    20+ yrs ago   History of kidney stones    3 surgerical removed, 1 passed   History of prolapse of bladder    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 Albuterol  inhaler as needed   Spinal headache 1991   blood patch placed   Spondylitis    Unsteady gait    occasionally   Urinary urgency     Assessment: 83yo female admitted for sepsis, while awaiting bed in ED became acutely SOB  with tacycardia and troponin found to be significantly elevated, to begin heparin .  Goal of Therapy:  Heparin  level 0.3-0.7 units/ml Monitor platelets by anticoagulation protocol: Yes   Plan:  Heparin  2000 units IV bolus followed by infusion at 550 units/hr. Monitor heparin  levels and CBC.  [place], PharmD, BCPS  10/18/2024,12:03 AM

## 2024-10-18 NOTE — Care Plan (Signed)
 Patient arrived to the unit AAOX 4 Complaining of pain 9/10 in the head and back No other signs/symptoms of distress Skin assessment done with Lulu, RN  Sonny, RN

## 2024-10-18 NOTE — Consult Note (Addendum)
 [facility] Nurse Consult Note: Consult requested for bilat legs and right heel.  Performed remotely after review of progress notes and photos in the EMR.  Pt is followed by Dr Harden of the ortho service prior to admission, most recently on 11/13 at the office; refer to those office notes for photos and plan of care.   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 Pt also had red moist patchy areas of full thickness skin loss to the plantar foot. She uses lymphedema pumps to contain edema when at home.   Bilat legs with generalized edema and erythremia, patchy areas of pink moist partial thickness skin loss, dry scabby skin, mod amt yellow drainage.    Dressing procedure/placement/frequency: Topical treatment orders provided for bedside nurses top perform as follows:Apply Xeroform gauze to right heel, bilat leg weeping areas Q Tues/Thurs/Sat, then cover with ABD pads and kerlex and ace wrap, beginning just behind toes to below knees.   Pt should follow-up with Dr Harden after discharge for continued care as an outpatient.  Please re-consult if further assistance is needed.  Thank-you,  Stephane Fought MSN, RN, CWOCN, CWCN-AP, CNS Contact Mon-Fri 0700-1500: [phone]

## 2024-10-18 NOTE — Progress Notes (Signed)
 PROGRESS NOTE    Isabel Barnes  FMW:[id number] DOB: 10-20-1941 DOA: 10/17/2024 PCP: [facility], PA-C    Brief Narrative: Patient is an 83 year old female with PMHx of rheumatoid arthritis, osteoarthritis, chronic BLE lymphedema, chronic RLE ulcer (follows w/ ortho, Dr. Harden), chronic osteomyelitis of tibia and calcaneus (follows w/ ID, Dr. Fleeta Rothman), IDA, hypothyroidism, HTN, chronic pain syndrome, remote CVA, who presented to [facility] ED on 10/17/2024 after suffering 2 falls, 1 on the day of presentation and 1 on the prior day, with generalized fatigue and weakness after receiving COVID vaccine, found down surrounded by emesis, urine, and diarrhea by caregiver.  In the ED, she was febrile to 102.4 F, tachycardic to 108, BP 150/78, SpO2 94% on RA initially though she became hypoxic to 84% on RA requiring supplemental oxygen at 6 L/min [place] with improvement SpO2 to 99%.  Labs were notable for leukocytosis 24.2, AST 92, ALT 113, ALP 138.  RPP was unremarkable.  Lactic acid was elevated to 4.0.  UA unremarkable.  C. difficile antigen positive, negative toxin.  She received 2 L LR and broad-spectrum antibiotics in the ED. She was admitted for sepsis with RLE cellulitis in the setting of chronic wounds as likely source of infection.    Assessment and Plan:  # Sepsis secondary to right lower extremity cellulitis in the setting of chronic RLE ulcer - Presented with fever to 102.4 F, tachycardic to 108, leukocytosis to 24.2, lactate elevated to 4.0, in the setting of RLE cellulitis meeting SIRS criteria for sepsis - Received 2 L LR in the ED and was started on maintenance IVF at 100 mL/h - Given chronic steroid use, was started on stress dose steroids - Afebrile overnight, leukocytosis improving to 16 - Blood cultures pending - Continue IV linezolid and Zosyn   # Type I MI versus type II MI - Patient developed acute shortness of breath overnight with increasing O2 requirement of tachycardia - EKG  showed normal sinus rhythm without obvious ST segment changes - High-sensitivity troponins elevated to peak of 4245 - CK elevated to 1.064 from 346 on admission - BNP elevated to 2245 - On-call cardiology consulted, Dr. Melia evaluated patient -concern for type I MI versus type II MI in the setting of sepsis, and recommended starting heparin  drip and loading dose aspirin  followed by aspirin  81 mg daily, echo to evaluate for new WMA.  Outpatient stress test after recovery from acute illness/sepsis. - EKG today showed sinus rhythm without acute ST segment changes - TTE (10/18/2024) showed LVEF 65-70%, no RWMA, moderate basal septal hypertrophy, severely elevated pulmonary pressures (estimated RV systolic pressure 61.8 L MAC), severe biatrial dilation, moderate MR with severe annular calcification, moderate TR, severe aortic valve calcification with moderate stenosis, and elevated right atrial pressure  # Acute hypoxic respiratory failure secondary to heart failure exacerbation - Patient developed worsening shortness of breath overnight, noted to be in respiratory distress on 6 L oxygen.  This was in the setting of receiving 2 L LR in the ED followed by maintenance IV fluids at 100 mL/h - Physical exam and CXR were concerning for pulmonary edema - BNP elevated to 2200 - Trialed on BiPAP but was unable to tolerate. - Was treated with IV Lasix  20 mg once and IV Ativan  0.5 mg with significant improvement in symptoms - Currently maintaining SpO2 100% on 2 L nasal cannula - Continue to wean O2 as tolerated - Strict I's and O's, daily weights  # Chronic RLE ulcer # Chronic osteomyelitis of  calcaneus # Chronic BLE lymphedema - Patient follows with outpatient Ortho, Dr. Harden, for routine wound care - Wound care consulted - ABI ordered - Management of underlying acute infection as above  #Hypomagnesemia - Mag 1.6 - Ordered IV mag sulfate 4 g  # Transaminitis - Asymptomatic, no abdominal pain or RUQ  tenderness - T. Bili normal - CK slightly elevated on admission to 346 - Improvement in ALT and ALP today, AST slightly worse - Continue with supportive care  # Rheumatoid arthritis - On chronic steroids at home, no DMARDs - IV Solu-Cortef  100 mg q8h reduced to 50 mg q12h  # Hypertension -  Normotensive - Continue holding home antihypertensives  # Hypothyroidism - Continue home Synthroid   # Generalized weakness and debility # Multiple falls # Disposition - Patient lives alone at home, presenting with 2 falls in 2 days in the setting of generalized weakness. Overall, she has limited access to care, caregiver is only available 4 hours a day, 4 days a week. Daughter, Mitzie, lives in [place], and her son lives in [place].  - PT OT consulted for evaluation and placement recommendations - TOC consulted for dispo planning for safe discharge  DVT prophylaxis: Heparin  drip   Code Status:   Code Status: Limited: Do not attempt resuscitation (DNR) -DNR-LIMITED -Do Not Intubate/DNI   Family Communication: Discussed with patient's daughter, Mitzie, over the phone. Also, discussed with patient's caregiver, Burnard, at bedside  Disposition Plan: Pending clinical improvement, PT/OT eval, suspect might need SNF PT - Follow Up Recommendations: Skilled nursing-short term rehab (<3 hours/day) (vs HHPT pending progress) OT -    DME Needs: PT equipment: None recommended by PT    Level of care: Progressive  Consultants:  Cardiology  Procedures:  None  Antimicrobials: IV Linezolid and Zosyn     Subjective: Patient examined at bedside.  Patient reports no recollection of events leading to her presentation to the hospital including her fall.  She was found by her caregiver, Burnard, sometime around 10 AM who noted that the patient was on the floor with surrounding emesis, having urinated and defecated on herself.  She noted that her 4 AM pain medications were not taken, indicating that she had fallen  prior to that.  This morning, the patient is alert and oriented, states she feels short of breath but otherwise denies any chest pain.  Objective: Vitals:   10/18/24 0430 10/18/24 0500 10/18/24 0515 10/18/24 0700  BP: 119/66 135/64 127/68 131/69  Pulse: 78 78 79 79  Resp: 19 17 (!) 23 18  Temp:      TempSrc:      SpO2: 100% 100% 100% 100%  Weight:      Height:        Intake/Output Summary (Last 24 hours) at 10/18/2024 0711 Last data filed at 10/18/2024 0321 Gross per 24 hour  Intake 36.56 ml  Output --  Net 36.56 ml   Filed Weights   10/17/24 1129  Weight: 46.3 kg    Examination:  Gen: NAD, A&Ox3 HEENT: NCAT, EOMI Neck: Supple, no JVD, no LAD CV: RRR, systolic murmur noted Resp: normal WOB, mild rhonchi bilaterally, no wheezing, [place] in place Abd: Soft, NTND, no guarding, BS normoactive Ext: LLE wrapped with ace bandage. RLE with chronic medial wound with full-thickness skin loss, with surrounding erythema, warmth, and tenderness to palpation.  Chronic lymphedema changes noted. Neuro: CN II-XII grossly intact, strength 5/5 b/l, sensation intact Psych: Calm, cooperative, appropriate affect   Data Reviewed: I have personally  reviewed following labs and imaging studies  CBC: Recent Labs  Lab 10/17/24 1204 10/17/24 2259 10/18/24 0443  WBC 24.2* 20.1* 16.0*  NEUTROABS 22.6*  --  15.1*  HGB 11.6* 11.7* 10.7*  HCT 36.4 37.2 33.3*  MCV 88.3 88.4 87.6  PLT 308 289 225   Basic Metabolic Panel: Recent Labs  Lab 10/17/24 1204 10/17/24 2259 10/18/24 0443  NA 137  --  140  K 3.9  --  3.6  CL 101  --  105  CO2 22  --  25  GLUCOSE 129*  --  137*  BUN 15  --  16  CREATININE 0.96 0.93 0.96  CALCIUM  8.7*  --  8.1*  MG  --   --  1.6*  PHOS  --   --  3.1   GFR: Estimated Creatinine Clearance: 31.3 mL/min (by C-G formula based on SCr of 0.96 mg/dL). Liver Function Tests: Recent Labs  Lab 10/17/24 1204 10/18/24 0443  AST 92* 99*  ALT 113* 82*  ALKPHOS 138* 98   BILITOT 1.1 1.0  PROT 6.3* 4.8*  ALBUMIN  3.4* 2.4*   No results for input(s): LIPASE, AMYLASE in the last 168 hours. No results for input(s): AMMONIA in the last 168 hours. Coagulation Profile: Recent Labs  Lab 10/17/24 1204  INR 1.0   Cardiac Enzymes: Recent Labs  Lab 10/17/24 1204 10/18/24 0443  CKTOTAL 346* 1,064*   BNP (last 3 results) No results for input(s): PROBNP in the last 8760 hours. HbA1C: No results for input(s): HGBA1C in the last 72 hours. CBG: No results for input(s): GLUCAP in the last 168 hours. Lipid Profile: No results for input(s): CHOL, HDL, LDLCALC, TRIG, CHOLHDL, LDLDIRECT in the last 72 hours. Thyroid Function Tests: No results for input(s): TSH, T4TOTAL, FREET4, T3FREE, THYROIDAB in the last 72 hours. Anemia Panel: No results for input(s): VITAMINB12, FOLATE, FERRITIN, TIBC, IRON, RETICCTPCT in the last 72 hours. Sepsis Labs: Recent Labs  Lab 10/17/24 1220 10/17/24 1413 10/17/24 2259 10/18/24 0443  PROCALCITON  --   --  17.40 21.50  LATICACIDVEN 3.5* 4.0*  --  1.5    Recent Results (from the past 240 hours)  Resp panel by RT-PCR (RSV, Flu A&B, Covid) Anterior Nasal Swab     Status: None   Collection Time: 10/17/24 12:17 PM   Specimen: Anterior Nasal Swab  Result Value Ref Range Status   SARS Coronavirus 2 by RT PCR NEGATIVE NEGATIVE Final   Influenza A by PCR NEGATIVE NEGATIVE Final   Influenza B by PCR NEGATIVE NEGATIVE Final    Comment: (NOTE) The Xpert Xpress SARS-CoV-2/FLU/RSV plus assay is intended as an aid in the diagnosis of influenza from Nasopharyngeal swab specimens and should not be used as a sole basis for treatment. Nasal washings and aspirates are unacceptable for Xpert Xpress SARS-CoV-2/FLU/RSV testing.  Fact Sheet for Patients: bloggercourse.com  Fact Sheet for Healthcare Providers: seriousbroker.it  This test is not  yet approved or cleared by the [place]  FDA and has been authorized for detection and/or diagnosis of SARS-CoV-2 by FDA under an Emergency Use Authorization (EUA). This EUA will remain in effect (meaning this test can be used) for the duration of the COVID-19 declaration under Section 564(b)(1) of the Act, 21 U.S.C. section 360bbb-3(b)(1), unless the authorization is terminated or revoked.     Resp Syncytial Virus by PCR NEGATIVE NEGATIVE Final    Comment: (NOTE) Fact Sheet for Patients: bloggercourse.com  Fact Sheet for Healthcare Providers: seriousbroker.it  This test is not  yet approved or cleared by the [place]  FDA and has been authorized for detection and/or diagnosis of SARS-CoV-2 by FDA under an Emergency Use Authorization (EUA). This EUA will remain in effect (meaning this test can be used) for the duration of the COVID-19 declaration under Section 564(b)(1) of the Act, 21 U.S.C. section 360bbb-3(b)(1), unless the authorization is terminated or revoked.  Performed at [facility], [address], [place], [place] [address]   Respiratory (~20 pathogens) panel by PCR     Status: None   Collection Time: 10/17/24 12:17 PM   Specimen: Nasopharyngeal Swab; Respiratory  Result Value Ref Range Status   Adenovirus NOT DETECTED NOT DETECTED Final   Coronavirus 229E NOT DETECTED NOT DETECTED Final    Comment: (NOTE) The Coronavirus on the Respiratory Panel, DOES NOT test for the novel  Coronavirus (2019 nCoV)    Coronavirus HKU1 NOT DETECTED NOT DETECTED Final   Coronavirus NL63 NOT DETECTED NOT DETECTED Final   Coronavirus OC43 NOT DETECTED NOT DETECTED Final   Metapneumovirus NOT DETECTED NOT DETECTED Final   Rhinovirus / Enterovirus NOT DETECTED NOT DETECTED Final   Influenza A NOT DETECTED NOT DETECTED Final   Influenza B NOT DETECTED NOT DETECTED Final   Parainfluenza Virus 1 NOT DETECTED NOT DETECTED  Final   Parainfluenza Virus 2 NOT DETECTED NOT DETECTED Final   Parainfluenza Virus 3 NOT DETECTED NOT DETECTED Final   Parainfluenza Virus 4 NOT DETECTED NOT DETECTED Final   Respiratory Syncytial Virus NOT DETECTED NOT DETECTED Final   Bordetella pertussis NOT DETECTED NOT DETECTED Final   Bordetella Parapertussis NOT DETECTED NOT DETECTED Final   Chlamydophila pneumoniae NOT DETECTED NOT DETECTED Final   Mycoplasma pneumoniae NOT DETECTED NOT DETECTED Final    Comment: Performed at [facility], [address] [place], [place] [address]  C Difficile Quick Screen w PCR reflex     Status: Abnormal   Collection Time: 10/17/24  5:28 PM   Specimen: STOOL  Result Value Ref Range Status   C Diff antigen POSITIVE (A) NEGATIVE Final   C Diff toxin NEGATIVE NEGATIVE Final   C Diff interpretation Results are indeterminate. See PCR results.  Final    Comment: Performed at [facility], [address] [place], [place] [address]  C. Diff by PCR, Reflexed     Status: None   Collection Time: 10/17/24  5:28 PM  Result Value Ref Range Status   Toxigenic C. Difficile by PCR NEGATIVE NEGATIVE Final    Comment: Patient is colonized with non toxigenic C. difficile. May not need treatment unless significant symptoms are present.   Hypervirulent Strain PRESUMPTIVE NEGATIVE PRESUMPTIVE NEGATIVE Final    Comment: Performed at [facility], [address] [place], [place] [address]  MRSA Next Gen by PCR, Nasal     Status: None   Collection Time: 10/18/24  4:14 AM   Specimen: Nasal Mucosa; Nasal Swab  Result Value Ref Range Status   MRSA by PCR Next Gen NOT DETECTED NOT DETECTED Final    Comment: (NOTE) The GeneXpert MRSA Assay (FDA approved for NASAL specimens only), is one component of a comprehensive MRSA colonization surveillance program. It is not intended to diagnose MRSA infection nor to guide or monitor treatment for MRSA infections. Test performance is not FDA approved in  patients less than 95 years old. Performed at [facility], [address], [place], [place] [address]      Radiology Studies:  DG Chest Port 1 View Result Date: 10/17/2024 EXAM: 1 VIEW(S) XRAY OF THE CHEST 10/17/2024 10:34:00 PM COMPARISON: 10/17/2024 CLINICAL HISTORY: SOB (shortness of breath) FINDINGS: LUNGS AND PLEURA: Increased bilateral perihilar interstitial and airspace opacities. No pleural effusion. No pneumothorax. Limited evaluation due to rotation. HEART AND MEDIASTINUM: Cardiomegaly. BONES AND SOFT TISSUES: Right shoulder arthroplasty. ACDF noted. No acute osseous abnormality. IMPRESSION: 1. Increased bilateral perihilar interstitial and airspace opacities. Limited evaluation due to rotation. 2. Cardiomegaly. Electronically signed by: Morgane Naveau MD 10/17/2024 10:58 PM EST RP Workstation: [id number]   CT Cervical Spine Wo Contrast Result Date: 10/17/2024 EXAM: CT CERVICAL SPINE WITHOUT CONTRAST 10/17/2024 04:16:12 PM TECHNIQUE: CT of the cervical spine was performed without the administration of intravenous contrast. Multiplanar reformatted images are provided for review. Automated exposure control, iterative reconstruction, and/or weight based adjustment of the mA/kV was utilized to reduce the radiation dose to as low as reasonably achievable. COMPARISON: CT cervical spine 04/25/2020. CLINICAL HISTORY: Facial trauma, blunt. Fall. FINDINGS: CERVICAL SPINE: BONES AND ALIGNMENT: No acute fracture. Chronic grade 1 anterolisthesis of C4 on C5, C5 on C6, and C7 on T1. 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 Large chronic cyst or erosion involving the dens. Slight interval widening of the atlantodental interval measuring 2 mm and attributed to progressive arthropathy. Suspected partial fusion across the left lateral C1-C2 articulation. Progressive left atlantooccipital arthropathy. Previous C3-C4 and C6-C7 ACDF. DEGENERATIVE CHANGES: Widespread facet  arthrosis with multilevel facet ankylosis. Partially calcified ligamentous thickening/pannus posterior to the dens results in mild to moderate spinal stenosis. SOFT TISSUES: No prevertebral soft tissue swelling. LUNGS: Partially visualized extensive pulmonary ground glass opacities in the right greater than left upper lobes. IMPRESSION: 1. No acute cervical spine fracture. 2. 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 3. Partially visualized bilateral pulmonary ground-glass opacities, possibly reflecting edema or pneumonia (including viral pneumonia). Electronically signed by: Dasie Hamburg MD 10/17/2024 04:39 PM EST RP Workstation: [id number]   CT Head Wo Contrast Result Date: 10/17/2024 EXAM: CT HEAD WITHOUT CONTRAST 10/17/2024 04:16:12 PM TECHNIQUE: CT of the head was performed without the administration of intravenous contrast. Automated exposure control, iterative reconstruction, and/or weight based adjustment of the mA/kV was utilized to reduce the radiation dose to as low as reasonably achievable. COMPARISON: Head CT 02/06/2022. CLINICAL HISTORY: Delirium. FINDINGS: BRAIN AND VENTRICLES: There is no evidence of an acute infarct, intracranial hemorrhage, mass, midline shift, hydrocephalus, or extra-axial fluid collection. There is mild cerebral atrophy. Patchy to confluent hypodensities in the cerebral white matter bilaterally are similar to the prior study and nonspecific but compatible with moderate chronic small vessel ischemic disease. Calcified atherosclerosis at the skull base. ORBITS: No acute abnormality. Bilateral cataract extraction. SINUSES: Mild mucosal thickening in the ethmoid sinuses. Clear mastoid air cells. SOFT TISSUES AND SKULL: No acute soft tissue abnormality. No skull fracture. IMPRESSION: 1. No acute intracranial abnormality. 2. Moderate chronic small vessel ischemic disease.  Electronically signed by: Dasie Hamburg MD 10/17/2024 04:22 PM EST RP Workstation: [id number]   DG Chest Port 1 View Result Date: 10/17/2024 EXAM: 1 VIEW(S) XRAY OF THE CHEST 10/17/2024 01:00:00 PM COMPARISON: Comparison 02/22/2022. CLINICAL HISTORY: Questionable sepsis - evaluate for abnormality. FINDINGS: LUNGS AND PLEURA: No focal pulmonary opacity. No pleural effusion. No pneumothorax. HEART AND MEDIASTINUM: Stable cardiomediastinal silhouette. BONES AND SOFT TISSUES: Status post right shoulder arthroplasty. No acute osseous abnormality. IMPRESSION: 1. No acute cardiopulmonary process. Electronically signed by: Lynwood Seip MD  10/17/2024 01:32 PM EST RP Workstation: [id number]    Scheduled Meds:  aspirin  EC  81 mg Oral Daily   cycloSPORINE   1 drop Both Eyes Daily   hydrocortisone  sod succinate (SOLU-CORTEF ) inj  100 mg Intravenous Q8H   hydrOXYzine  25 mg Oral Once   Continuous Infusions:  heparin  550 Units/hr (10/18/24 0321)   linezolid (ZYVOX) IV 600 mg (10/18/24 9352)   piperacillin -tazobactam (ZOSYN )  IV 3.375 g (10/18/24 0410)     LOS:  LOS: 1 day   Time Spent: 60 minutes  Unresulted Labs (From admission, onward)     Start     Ordered   10/19/24 0500  Heparin  level (unfractionated)  Daily,   R     Placed in And Linked Group   10/18/24 0009   10/19/24 0500  CBC  Daily,   R     Placed in And Linked Group   10/18/24 0009   10/18/24 2200  Heparin  level (unfractionated)  Once-Timed,   TIMED        10/18/24 1338   10/18/24 0500  Procalcitonin  Daily,   R     Question:  Specimen collection method  Answer:  IV Team=IV Team collect   10/17/24 1808   10/18/24 0500  Phosphorus  Daily,   R     Question:  Specimen collection method  Answer:  IV Team=IV Team collect   10/17/24 1808   10/18/24 0500  Magnesium   Daily,   R     Question:  Specimen collection method  Answer:  IV Team=IV Team collect   10/17/24 1808   10/18/24 0500  C-reactive protein  Daily,   R     Question:   Specimen collection method  Answer:  IV Team=IV Team collect   10/17/24 1808   10/18/24 0500  CBC with Differential/Platelet  Daily,   R     Question:  Specimen collection method  Answer:  IV Team=IV Team collect   10/17/24 1808   10/18/24 0500  Comprehensive metabolic panel with GFR  Daily,   R     Question:  Specimen collection method  Answer:  IV Team=IV Team collect   10/17/24 1808             Sadat Sliwa Al-Sultani, MD [facility]  If 7PM-7AM, please contact night-coverage  10/18/2024, 7:11 AM

## 2024-10-18 NOTE — ED Notes (Addendum)
 Spoke with daughter on phone. Daughter concerned about pt being discharged and going home, daughter says that pt cannot take care of herself at home and wants her to be put in a nursing home or assisted living. Daughter also inquired about refusing all care while here and made sure she was a DNR.

## 2024-10-18 NOTE — ED Notes (Signed)
 CCMD called to place pt on monitor

## 2024-10-19 ENCOUNTER — Inpatient Hospital Stay (HOSPITAL_COMMUNITY)

## 2024-10-19 DIAGNOSIS — L97918 Non-pressure chronic ulcer of unspecified part of right lower leg with other specified severity: Secondary | ICD-10-CM

## 2024-10-19 DIAGNOSIS — L03115 Cellulitis of right lower limb: Secondary | ICD-10-CM | POA: Diagnosis not present

## 2024-10-19 DIAGNOSIS — R7989 Other specified abnormal findings of blood chemistry: Secondary | ICD-10-CM | POA: Diagnosis not present

## 2024-10-19 DIAGNOSIS — A419 Sepsis, unspecified organism: Secondary | ICD-10-CM | POA: Diagnosis not present

## 2024-10-19 DIAGNOSIS — L97419 Non-pressure chronic ulcer of right heel and midfoot with unspecified severity: Secondary | ICD-10-CM

## 2024-10-19 DIAGNOSIS — L0390 Cellulitis, unspecified: Secondary | ICD-10-CM

## 2024-10-19 DIAGNOSIS — M86679 Other chronic osteomyelitis, unspecified ankle and foot: Secondary | ICD-10-CM

## 2024-10-19 LAB — CBC WITH DIFFERENTIAL/PLATELET
Abs Immature Granulocytes: 0.18 K/uL — ABNORMAL HIGH (ref 0.00–0.07)
Basophils Absolute: 0 K/uL (ref 0.0–0.1)
Basophils Relative: 0 %
Eosinophils Absolute: 0 K/uL (ref 0.0–0.5)
Eosinophils Relative: 0 %
HCT: 33.3 % — ABNORMAL LOW (ref 36.0–46.0)
Hemoglobin: 10.8 g/dL — ABNORMAL LOW (ref 12.0–15.0)
Immature Granulocytes: 1 %
Lymphocytes Relative: 3 %
Lymphs Abs: 0.6 K/uL — ABNORMAL LOW (ref 0.7–4.0)
MCH: 28.3 pg (ref 26.0–34.0)
MCHC: 32.4 g/dL (ref 30.0–36.0)
MCV: 87.4 fL (ref 80.0–100.0)
Monocytes Absolute: 0.9 K/uL (ref 0.1–1.0)
Monocytes Relative: 4 %
Neutro Abs: 20.5 K/uL — ABNORMAL HIGH (ref 1.7–7.7)
Neutrophils Relative %: 92 %
Platelets: 229 K/uL (ref 150–400)
RBC: 3.81 MIL/uL — ABNORMAL LOW (ref 3.87–5.11)
RDW: 14.2 % (ref 11.5–15.5)
WBC: 22.2 K/uL — ABNORMAL HIGH (ref 4.0–10.5)
nRBC: 0 % (ref 0.0–0.2)

## 2024-10-19 LAB — VAS US ABI WITH/WO TBI
Left ABI: 1.97
Right ABI: 1.97

## 2024-10-19 LAB — COMPREHENSIVE METABOLIC PANEL WITH GFR
ALT: 68 U/L — ABNORMAL HIGH (ref 0–44)
AST: 80 U/L — ABNORMAL HIGH (ref 15–41)
Albumin: 2.4 g/dL — ABNORMAL LOW (ref 3.5–5.0)
Alkaline Phosphatase: 98 U/L (ref 38–126)
Anion gap: 16 — ABNORMAL HIGH (ref 5–15)
BUN: 27 mg/dL — ABNORMAL HIGH (ref 8–23)
CO2: 18 mmol/L — ABNORMAL LOW (ref 22–32)
Calcium: 8 mg/dL — ABNORMAL LOW (ref 8.9–10.3)
Chloride: 104 mmol/L (ref 98–111)
Creatinine, Ser: 0.94 mg/dL (ref 0.44–1.00)
GFR, Estimated: 59 mL/min — ABNORMAL LOW (ref >60)
Glucose, Bld: 133 mg/dL — ABNORMAL HIGH (ref 70–99)
Potassium: 3.9 mmol/L (ref 3.5–5.1)
Sodium: 138 mmol/L (ref 135–145)
Total Bilirubin: 0.6 mg/dL (ref 0.0–1.2)
Total Protein: 5.1 g/dL — ABNORMAL LOW (ref 6.5–8.1)

## 2024-10-19 LAB — C-REACTIVE PROTEIN: CRP: 18.5 mg/dL — ABNORMAL HIGH (ref <1.0)

## 2024-10-19 LAB — PHOSPHORUS: Phosphorus: 2.9 mg/dL (ref 2.5–4.6)

## 2024-10-19 LAB — HEPARIN LEVEL (UNFRACTIONATED)
Heparin Unfractionated: 0.34 [IU]/mL (ref 0.30–0.70)
Heparin Unfractionated: 0.3 [IU]/mL (ref 0.30–0.70)

## 2024-10-19 LAB — MAGNESIUM: Magnesium: 2.5 mg/dL — ABNORMAL HIGH (ref 1.7–2.4)

## 2024-10-19 LAB — CK: Total CK: 365 U/L — ABNORMAL HIGH (ref 38–234)

## 2024-10-19 LAB — TROPONIN I (HIGH SENSITIVITY): Troponin I (High Sensitivity): 2107 ng/L (ref <18)

## 2024-10-19 MED ORDER — METHOCARBAMOL 500 MG PO TABS
500.0000 mg | ORAL_TABLET | Freq: Three times a day (TID) | ORAL | Status: DC | PRN
Start: 2024-10-19 — End: 2024-10-23
  Administered 2024-10-21 – 2024-10-23 (×3): 500 mg via ORAL
  Filled 2024-10-19 (×3): qty 1

## 2024-10-19 MED ORDER — SODIUM CHLORIDE 0.9 % IV SOLN: 3.0000 g | Freq: Three times a day (TID) | INTRAVENOUS | Status: DC

## 2024-10-19 MED ORDER — METHYLPREDNISOLONE SODIUM SUCC 125 MG IJ SOLR
60.0000 mg | Freq: Once | INTRAMUSCULAR | Status: AC
  Administered 2024-10-19: 60 mg via INTRAVENOUS
  Filled 2024-10-19: qty 2

## 2024-10-19 MED ORDER — DIPHENHYDRAMINE HCL 25 MG PO CAPS
50.0000 mg | ORAL_CAPSULE | Freq: Once | ORAL | Status: AC
  Administered 2024-10-19: 50 mg via ORAL
  Filled 2024-10-19: qty 2

## 2024-10-19 MED ORDER — ROSUVASTATIN CALCIUM 5 MG PO TABS
10.0000 mg | ORAL_TABLET | Freq: Every day | ORAL | Status: DC
  Administered 2024-10-19 – 2024-10-23 (×3): 10 mg via ORAL
  Filled 2024-10-19 (×5): qty 2

## 2024-10-19 MED ORDER — ENOXAPARIN SODIUM 40 MG/0.4ML IJ SOSY
40.0000 mg | PREFILLED_SYRINGE | INTRAMUSCULAR | Status: DC
  Administered 2024-10-19 – 2024-10-22 (×4): 40 mg via SUBCUTANEOUS
  Filled 2024-10-19 (×4): qty 0.4

## 2024-10-19 MED ORDER — METHYLPREDNISOLONE SODIUM SUCC 125 MG IJ SOLR: 60.0000 mg | INTRAMUSCULAR | Status: DC

## 2024-10-19 MED ORDER — AMOXICILLIN-POT CLAVULANATE 500-125 MG PO TABS
1.0000 | ORAL_TABLET | Freq: Two times a day (BID) | ORAL | Status: DC
  Administered 2024-10-19 – 2024-10-23 (×9): 1 via ORAL
  Filled 2024-10-19 (×10): qty 1

## 2024-10-19 MED ORDER — PREDNISONE 20 MG PO TABS: 20.0000 mg | ORAL_TABLET | Freq: Every day | ORAL | Status: DC

## 2024-10-19 MED ORDER — HYDROCODONE-ACETAMINOPHEN 10-325 MG PO TABS
1.0000 | ORAL_TABLET | Freq: Four times a day (QID) | ORAL | Status: DC
Start: 2024-10-19 — End: 2024-10-23
  Administered 2024-10-19 – 2024-10-23 (×17): 1 via ORAL
  Filled 2024-10-19 (×17): qty 1

## 2024-10-19 MED ORDER — HYDROXYZINE HCL 25 MG PO TABS
25.0000 mg | ORAL_TABLET | Freq: Three times a day (TID) | ORAL | Status: DC | PRN
  Administered 2024-10-21: 25 mg via ORAL
  Filled 2024-10-19: qty 1

## 2024-10-19 MED ORDER — DIPHENHYDRAMINE HCL 50 MG/ML IJ SOLN
50.0000 mg | Freq: Once | INTRAMUSCULAR | Status: AC
  Filled 2024-10-19: qty 1

## 2024-10-19 MED ORDER — IOHEXOL 350 MG/ML SOLN
75.0000 mL | Freq: Once | INTRAVENOUS | Status: AC | PRN
  Administered 2024-10-19: 75 mL via INTRAVENOUS

## 2024-10-19 MED ORDER — LOPERAMIDE HCL 2 MG PO CAPS
4.0000 mg | ORAL_CAPSULE | Freq: Once | ORAL | Status: AC
  Administered 2024-10-19: 4 mg via ORAL
  Filled 2024-10-19: qty 2

## 2024-10-19 MED ORDER — LINEZOLID 600 MG PO TABS
600.0000 mg | ORAL_TABLET | Freq: Two times a day (BID) | ORAL | Status: DC
  Administered 2024-10-19 – 2024-10-23 (×8): 600 mg via ORAL
  Filled 2024-10-19 (×8): qty 1

## 2024-10-19 NOTE — Progress Notes (Addendum)
 PROGRESS NOTE     Patient Demographics:    Isabel Barnes, is a 83 y.o. female, DOB - 06-13-1941, FMW:[id number]  Outpatient Primary MD for the patient is Isabel Barnes    LOS - 2  Admit date - 10/17/2024    Chief Complaint  Patient presents with   Fall       Brief Narrative (HPI from H&P)   83 year old female with PMHx of rheumatoid arthritis, osteoarthritis, chronic BLE lymphedema, chronic RLE ulcer (follows w/ ortho, Isabel Barnes), chronic osteomyelitis of tibia and calcaneus (follows w/ ID, Isabel Barnes), IDA, hypothyroidism, HTN, chronic pain syndrome, remote CVA, who presented to [facility] ED on 10/17/2024 after suffering 2 falls, 1 on the day of presentation and 1 on the prior day, with generalized fatigue and weakness after receiving COVID vaccine, found down surrounded by emesis, urine, and diarrhea by caregiver.    In the ED, she was febrile to 102.4 F, tachycardic to 108, BP 150/78, SpO2 94% she was diagnosed with severe sepsis due to right lower extremity cellulitis in the setting of chronic wounds, she was revived placed on antibiotics, unfortunately she went into acute on chronic diastolic CHF with demand ischemia.  Antley being treated for NSTEMI and sepsis.   Subjective:    Isabel Barnes today has, No headache, No chest pain, No abdominal pain - No Nausea, No new weakness tingling or numbness, right lower extremity pain has improved.   Assessment  & Plan :     # Sepsis secondary to right lower extremity cellulitis in the setting of chronic RLE ulcer - Presented with fever to 102.4 F, tachycardic to 108, leukocytosis to 24.2, lactate elevated to 4.0, in the setting of RLE cellulitis meeting SIRS criteria  for sepsis, sepsis pathophysiology has resolved, initially on stress dose steroids now on close to home dose oral steroid, on empiric antibiotics, blood cultures negative, inflammatory markers still elevated, leukocytosis had initially improved but now worsened again, ABI pending, right lower extremity CT soft tissue pending, ID and plastics to evaluate as well.    Daughter also wants GOC DW with  Pall care- will consult.    # Chronic RLE ulcer, # Chronic osteomyelitis of calcaneus- has had multiple courses of antibiotics has been followed by ID in the outpatient setting, will consult ID as leukocytosis worse on 10/19/2024, ID/plastics also consulted on 10/19/2024.  # Chronic BLE lymphedema  - Supportive care Unna boots once better   C. difficile antigen positive.  Discussed with ID on 10/19/2024.  This is not a clinical infection.  Supportive care.    # Type I MI versus type II MI with acute on chronic diastolic CHF causing acute hypoxic respiratory failure -  Likely due to IV fluid resuscitation required for sepsis, no chest pain, EKG nonspecific changes, echo with a preserved EF of 65% and no wall motion abnormality, does have severely elevated pulmonary pressures (estimated RV systolic pressure 61.8 L MAC), severe biatrial dilation, moderate MR with severe annular calcification, moderate TR, severe aortic valve calcification with moderate stenosis, and elevated right atrial pressure.  Neurology on board, IV heparin  drip likely for 48 hours defer this to cardiology, remains chest pain-free, hypoxic respiratory failure resolved currently symptom-free on room air.  # Symptomatic transaminitis due to shock liver from hypotension and sepsis - Asymptomatic, no abdominal pain or RUQ tenderness total bilirubin stable, trend improving.  Symptom-free.   # Rheumatoid arthritis - On chronic steroids at home, was initially on stress dose steroids tapered to oral steroid prednisone  20 mg daily on 10/19/2024    # Hypertension - Pressure stabilized Taper down stress dose steroids to oral prednisone , she is chronically on prednisone , if blood pressure remains stable will add Coreg  cautiously in a day or 2.   # Hypothyroidism  - Continue home Synthroid    # Generalized weakness and debility  # Multiple falls # Disposition- PT OT may require SNF, she lives by herself.       Condition - Extremely Guarded  Family Communication  : Daughter updated on the day of admission 10/17/2024  Code Status : DNR  Consults  : Cardiology, orthopedic/plastics, ID, Pall care  PUD Prophylaxis :     Procedures  :     ABI bilateral lower extremity.    CT soft tissue right lower extremity   TTE -  1. Left ventricular ejection fraction, by estimation, is 65 to 70%. The left ventricle has normal function. The left ventricle has no regional wall motion abnormalities. 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 Left ventricular diastolic parameters are indeterminate. Elevated left atrial pressure.  2. Right ventricular systolic function is normal. The right ventricular size is normal. There is severely elevated pulmonary artery systolic pressure. The estimated right ventricular systolic pressure is 61.8 mmHg.  3. Left atrial size was severely dilated.  4. Right atrial size was severely dilated.  5. The mitral valve is degenerative. Moderate mitral valve regurgitation. No evidence of mitral stenosis. Severe mitral annular calcification.  6. Tricuspid valve regurgitation is moderate to severe.  7. The aortic valve is abnormal. There is severe calcifcation of the aortic valve. Aortic valve regurgitation is not visualized. Moderate aortic valve stenosis. Aortic valve area, by VTI measures 1.50 cm. Aortic valve mean gradient measures 28.0 mmHg. Aortic valve Vmax measures 3.47 m/s.  8. The inferior vena cava is dilated in size with <50% respiratory variability, suggesting right atrial  pressure of 15 mmHg  CT head and C-spine.  Nonacute.      Disposition Plan  :    Status is: Inpatient  DVT Prophylaxis  :  Heparin  gtt    Lab Results  Component Value Date   PLT 229 10/19/2024    Diet :  Diet Order             DIET SOFT Fluid consistency: Thin  Diet effective now                    Inpatient Medications  Scheduled Meds:  aspirin  EC  81 mg Oral Daily   cycloSPORINE   1 drop Both Eyes Daily   HYDROcodone -acetaminophen   1 tablet Oral Q6H   loperamide   4 mg Oral Once   [START ON 10/20/2024] predniSONE   20 mg Oral Q breakfast   Continuous Infusions:  heparin  850 Units/hr (10/19/24 0648)   linezolid  (ZYVOX ) IV 600 mg (10/19/24 0641)   piperacillin -tazobactam (ZOSYN )  IV 3.375 g (10/19/24 0122)   PRN Meds:.acetaminophen  **OR** acetaminophen , hydrOXYzine , naphazoline-glycerin , nitroGLYCERIN , ondansetron  **OR** ondansetron  (ZOFRAN ) IV, triamcinolone   Antibiotics  :    Anti-infectives (From admission, onward)    Start     Dose/Rate Route Frequency Ordered Stop   10/17/24 1830  linezolid  (ZYVOX ) IVPB 600 mg        600 mg 300 mL/hr over 60 Minutes Intravenous Every 12 hours 10/17/24 1816     10/17/24 1830  piperacillin -tazobactam (ZOSYN ) IVPB 3.375 g        3.375 g 12.5 mL/hr over 240 Minutes Intravenous Every 8 hours 10/17/24 1816     10/17/24 1745  vancomycin  (VANCOCIN ) capsule 500 mg  Status:  Discontinued        500 mg Oral 4 times daily 10/17/24 1731 10/17/24 1814   10/17/24 1730  fluconazole  (DIFLUCAN ) tablet 150 mg  Status:  Discontinued        150 mg Oral Weekly 10/17/24 1726 10/17/24 2252   10/17/24 1230  ceFEPIme  (MAXIPIME ) 2 g in sodium chloride  0.9 % 100 mL IVPB        2 g 200 mL/hr over 30 Minutes Intravenous  Once 10/17/24 1222 10/17/24 1353   10/17/24 1230  vancomycin  (VANCOCIN ) IVPB 1000 mg/200 mL premix        1,000 mg 200 mL/hr over 60 Minutes Intravenous  Once 10/17/24 1222 10/17/24 1624   10/17/24 1230  metroNIDAZOLE  (FLAGYL )  IVPB 500 mg        500 mg 100 mL/hr over 60 Minutes Intravenous  Once 10/17/24 1222 10/17/24 1504         Objective:   Vitals:   10/19/24 0000 10/19/24 0400 10/19/24 0500 10/19/24 0800  BP: 115/63 (!) 115/59  139/63  Pulse: 67 61  64  Resp: 15 15  19   Temp: 98 F (36.7 C) 98 F (36.7 C)    TempSrc: Oral Oral    SpO2: 100% 97%  96%  Weight:   67 kg   Height:        Wt Readings from Last 3 Encounters:  10/19/24 67 kg  12/19/23 55.3 kg  10/24/23 55.3 kg     Intake/Output Summary (Last 24 hours) at 10/19/2024 0953 Last data filed at 10/19/2024 0641 Gross per 24 hour  Intake 627.56 ml  Output 150 ml  Net 477.56 ml     Physical Exam  Awake Alert, No new F.N deficits, Normal affect [place].AT,PERRAL Supple Neck, No JVD,   Symmetrical Chest wall movement, Good air movement bilaterally, CTAB RRR,No Gallops,Rubs or new Murmurs,  +ve B.Sounds, Abd Soft, No tenderness,   No Cyanosis, Clubbing or edema     RN pressure injury documentation:  Wound 10/18/24 Pressure Injury Heel Right Stage 3 -  Full thickness tissue loss. Subcutaneous fat may be visible but bone, tendon or muscle are NOT exposed. (Active)      Data Review:    Recent Labs  Lab 10/17/24 1204 10/17/24 2259 10/18/24 0443 10/19/24 0702  WBC 24.2* 20.1* 16.0* 22.2*  HGB 11.6* 11.7* 10.7* 10.8*  HCT 36.4 37.2 33.3* 33.3*  PLT 308 289 225 229  MCV 88.3 88.4 87.6 87.4  MCH 28.2 27.8 28.2 28.3  MCHC 31.9 31.5 32.1 32.4  RDW 14.1 14.2 14.2 14.2  LYMPHSABS 0.4*  --  0.4* 0.6*  MONOABS 0.8  --  0.3 0.9  EOSABS 0.1  --  0.0 0.0  BASOSABS 0.1  --  0.0 0.0    Recent Labs  Lab 10/17/24 1204 10/17/24 1220 10/17/24 1413 10/17/24 2259 10/17/24 2300 10/18/24 0443 10/19/24 0702  NA 137  --   --   --   --  140 138  K 3.9  --   --   --   --  3.6 3.9  CL 101  --   --   --   --  105 104  CO2 22  --   --   --   --  25 18*  ANIONGAP 14  --   --   --   --  10 16*  GLUCOSE 129*  --   --   --   --  137* 133*   BUN 15  --   --   --   --  16 27*  CREATININE 0.96  --   --  0.93  --  0.96 0.94  AST 92*  --   --   --   --  99* 80*  ALT 113*  --   --   --   --  82* 68*  ALKPHOS 138*  --   --   --   --  98 98  BILITOT 1.1  --   --   --   --  1.0 0.6  ALBUMIN  3.4*  --   --   --   --  2.4* 2.4*  CRP  --   --   --   --   --  17.5* 18.5*  PROCALCITON  --   --   --  17.40  --  21.50  --   LATICACIDVEN  --  3.5* 4.0*  --   --  1.5  --   INR 1.0  --   --   --   --   --   --   BNP  --   --   --   --  2,245.5*  --   --   MG  --   --   --   --   --  1.6* 2.5*  PHOS  --   --   --   --   --  3.1 2.9  CALCIUM  8.7*  --   --   --   --  8.1* 8.0*      Recent Labs  Lab 10/17/24 1204 10/17/24 1220 10/17/24 1413 10/17/24 2259 10/17/24 2300 10/18/24 0443 10/19/24 0702  CRP  --   --   --   --   --  17.5* 18.5*  PROCALCITON  --   --   --  17.40  --  21.50  --   LATICACIDVEN  --  3.5* 4.0*  --   --  1.5  --  INR 1.0  --   --   --   --   --   --   BNP  --   --   --   --  2,245.5*  --   --   MG  --   --   --   --   --  1.6* 2.5*  CALCIUM  8.7*  --   --   --   --  8.1* 8.0*    --------------------------------------------------------------------------------------------------------------- Lab Results  Component Value Date   CHOL 162 09/13/2023   HDL 50 09/13/2023   LDLCALC 103 (H) 09/13/2023   TRIG 45 09/13/2023   CHOLHDL 3.2 09/13/2023    No results found for: HGBA1C No results for input(s): TSH, T4TOTAL, FREET4, T3FREE, THYROIDAB in the last 72 hours. No results for input(s): VITAMINB12, FOLATE, FERRITIN, TIBC, IRON, RETICCTPCT in the last 72 hours. ------------------------------------------------------------------------------------------------------------------ Cardiac Enzymes No results for input(s): CKMB, TROPONINI, MYOGLOBIN in the last 168 hours.  Invalid input(s): CK  Micro Results Recent Results (from the past 240 hours)  Culture, blood (Routine x 2)      Status: None (Preliminary result)   Collection Time: 10/17/24 12:02 PM   Specimen: BLOOD LEFT ARM  Result Value Ref Range Status   Specimen Description BLOOD LEFT ARM  Final   Special Requests   Final    BOTTLES DRAWN AEROBIC AND ANAEROBIC Blood Culture results may not be optimal due to an inadequate volume of blood received in culture bottles   Culture   Final    NO GROWTH 2 DAYS Performed at [facility], [address] [place], [place] [address]    Report Status PENDING  Incomplete  Culture, blood (Routine x 2)     Status: None (Preliminary result)   Collection Time: 10/17/24 12:02 PM   Specimen: BLOOD RIGHT ARM  Result Value Ref Range Status   Specimen Description BLOOD RIGHT ARM  Final   Special Requests   Final    AEROBIC BOTTLE ONLY Blood Culture results may not be optimal due to an inadequate volume of blood received in culture bottles   Culture   Final    NO GROWTH 2 DAYS Performed at [facility], [address] [place], [place] [address]    Report Status PENDING  Incomplete  Resp panel by RT-PCR (RSV, Flu A&B, Covid) Anterior Nasal Swab     Status: None   Collection Time: 10/17/24 12:17 PM   Specimen: Anterior Nasal Swab  Result Value Ref Range Status   SARS Coronavirus 2 by RT PCR NEGATIVE NEGATIVE Final   Influenza A by PCR NEGATIVE NEGATIVE Final   Influenza B by PCR NEGATIVE NEGATIVE Final    Comment: (NOTE) The Xpert Xpress SARS-CoV-2/FLU/RSV plus assay is intended as an aid in the diagnosis of influenza from Nasopharyngeal swab specimens and should not be used as a sole basis for treatment. Nasal washings and aspirates are unacceptable for Xpert Xpress SARS-CoV-2/FLU/RSV testing.  Fact Sheet for Patients: bloggercourse.com  Fact Sheet for Healthcare Providers: seriousbroker.it  This test is not yet approved or cleared by the [place]  FDA and has been authorized for detection and/or  diagnosis of SARS-CoV-2 by FDA under an Emergency Use Authorization (EUA). This EUA will remain in effect (meaning this test can be used) for the duration of the COVID-19 declaration under Section 564(b)(1) of the Act, 21 U.S.C. section 360bbb-3(b)(1), unless the authorization is terminated or revoked.     Resp Syncytial Virus by PCR  NEGATIVE NEGATIVE Final    Comment: (NOTE) Fact Sheet for Patients: bloggercourse.com  Fact Sheet for Healthcare Providers: seriousbroker.it  This test is not yet approved or cleared by the [place]  FDA and has been authorized for detection and/or diagnosis of SARS-CoV-2 by FDA under an Emergency Use Authorization (EUA). This EUA will remain in effect (meaning this test can be used) for the duration of the COVID-19 declaration under Section 564(b)(1) of the Act, 21 U.S.C. section 360bbb-3(b)(1), unless the authorization is terminated or revoked.  Performed at [facility], [address], [place], [place] [address]   Respiratory (~20 pathogens) panel by PCR     Status: None   Collection Time: 10/17/24 12:17 PM   Specimen: Nasopharyngeal Swab; Respiratory  Result Value Ref Range Status   Adenovirus NOT DETECTED NOT DETECTED Final   Coronavirus 229E NOT DETECTED NOT DETECTED Final    Comment: (NOTE) The Coronavirus on the Respiratory Panel, DOES NOT test for the novel  Coronavirus (2019 nCoV)    Coronavirus HKU1 NOT DETECTED NOT DETECTED Final   Coronavirus NL63 NOT DETECTED NOT DETECTED Final   Coronavirus OC43 NOT DETECTED NOT DETECTED Final   Metapneumovirus NOT DETECTED NOT DETECTED Final   Rhinovirus / Enterovirus NOT DETECTED NOT DETECTED Final   Influenza A NOT DETECTED NOT DETECTED Final   Influenza B NOT DETECTED NOT DETECTED Final   Parainfluenza Virus 1 NOT DETECTED NOT DETECTED Final   Parainfluenza Virus 2 NOT DETECTED NOT DETECTED Final   Parainfluenza Virus 3 NOT  DETECTED NOT DETECTED Final   Parainfluenza Virus 4 NOT DETECTED NOT DETECTED Final   Respiratory Syncytial Virus NOT DETECTED NOT DETECTED Final   Bordetella pertussis NOT DETECTED NOT DETECTED Final   Bordetella Parapertussis NOT DETECTED NOT DETECTED Final   Chlamydophila pneumoniae NOT DETECTED NOT DETECTED Final   Mycoplasma pneumoniae NOT DETECTED NOT DETECTED Final    Comment: Performed at [facility], [address], [place], [place] [address]  C Difficile Quick Screen w PCR reflex     Status: Abnormal   Collection Time: 10/17/24  5:28 PM   Specimen: STOOL  Result Value Ref Range Status   C Diff antigen POSITIVE (A) NEGATIVE Final   C Diff toxin NEGATIVE NEGATIVE Final   C Diff interpretation Results are indeterminate. See PCR results.  Final    Comment: Performed at [facility], [address] [place], [place] [address]  C. Diff by PCR, Reflexed     Status: None   Collection Time: 10/17/24  5:28 PM  Result Value Ref Range Status   Toxigenic C. Difficile by PCR NEGATIVE NEGATIVE Final    Comment: Patient is colonized with non toxigenic C. difficile. May not need treatment unless significant symptoms are present.   Hypervirulent Strain PRESUMPTIVE NEGATIVE PRESUMPTIVE NEGATIVE Final    Comment: Performed at [facility], [address], [place], [place] [address]  MRSA Next Gen by PCR, Nasal     Status: None   Collection Time: 10/18/24  4:14 AM   Specimen: Nasal Mucosa; Nasal Swab  Result Value Ref Range Status   MRSA by PCR Next Gen NOT DETECTED NOT DETECTED Final    Comment: (NOTE) The GeneXpert MRSA Assay (FDA approved for NASAL specimens only), is one component of a comprehensive MRSA colonization surveillance program. It is not intended to diagnose MRSA infection nor to guide or monitor treatment for MRSA infections. Test performance is not FDA approved in patients less  than 64 years old. Performed at [facility], [address],  [place], [place] [address]     Radiology Report ECHOCARDIOGRAM COMPLETE Result Date: 10/18/2024    ECHOCARDIOGRAM REPORT   Patient Name:   SWETA HALSETH Date of Exam: 10/18/2024 Medical Rec #:  [id number]         Height:       62.0 in Accession #:    [id number]        Weight:       102.0 lb Date of Birth:  1941-04-26          BSA:          1.436 m Patient Age:    83 years          BP:           154/70 mmHg Patient Gender: F                 HR:           86 bpm. Exam Location:  Inpatient Procedure: 2D Echo, Cardiac Doppler, Color Doppler and PEDOF (Both Spectral and            Color Flow Doppler were utilized during procedure). Indications:    NSTEMI I21.4  History:        Patient has prior history of Echocardiogram examinations, most                 recent 02/08/2022. NSTEMI; Risk Factors:Hypertension and                 Dyslipidemia.  Sonographer:    Koleen Popper RDCS Referring Phys: CURTISTINE LITTIE FARR  Sonographer Comments: Image acquisition challenging due to patient body habitus. IMPRESSIONS  1. Left ventricular ejection fraction, by estimation, is 65 to 70%. The left ventricle has normal function. The left ventricle has no regional wall motion abnormalities. 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 Left ventricular diastolic parameters are indeterminate. Elevated left atrial pressure.  2. Right ventricular systolic function is normal. The right ventricular size is normal. There is severely elevated pulmonary artery systolic pressure. The estimated right ventricular systolic pressure is 61.8 mmHg.  3. Left atrial size was severely dilated.  4. Right atrial size was severely dilated.  5. The mitral valve is degenerative. Moderate mitral valve regurgitation. No evidence of mitral stenosis. Severe mitral annular calcification.  6. Tricuspid valve regurgitation is moderate to severe.  7. The aortic valve is abnormal. There is severe calcifcation of the aortic valve.  Aortic valve regurgitation is not visualized. Moderate aortic valve stenosis. Aortic valve area, by VTI measures 1.50 cm. Aortic valve mean gradient measures 28.0 mmHg. Aortic valve Vmax measures 3.47 m/s.  8. The inferior vena cava is dilated in size with <50% respiratory variability, suggesting right atrial pressure of 15 mmHg. FINDINGS  Left Ventricle: Left ventricular ejection fraction, by estimation, is 65 to 70%. The left ventricle has normal function. The left ventricle has no regional wall motion abnormalities. The left ventricular internal cavity size was normal in size. There is  moderate asymmetric left ventricular hypertrophy of the basal-septal segment. Left ventricular diastolic parameters are indeterminate. Elevated left atrial pressure. Right Ventricle: The right ventricular size is normal. No increase in right ventricular wall thickness. Right ventricular systolic function is normal. There is severely elevated pulmonary artery systolic pressure. The tricuspid regurgitant velocity is 3.42 m/s, and with an assumed right atrial pressure of 15 mmHg, the estimated  right ventricular systolic pressure is 61.8 mmHg. Left Atrium: Left atrial size was severely dilated. Right Atrium: Right atrial size was severely dilated. Pericardium: There is no evidence of pericardial effusion. Mitral Valve: The mitral valve is degenerative in appearance. Severe mitral annular calcification. Moderate mitral valve regurgitation. No evidence of mitral valve stenosis. MV peak gradient, 12.7 mmHg. The mean mitral valve gradient is 4.0 mmHg with average heart rate of 82 bpm. Tricuspid Valve: The tricuspid valve is normal in structure. Tricuspid valve regurgitation is moderate to severe. No evidence of tricuspid stenosis. Aortic Valve: The aortic valve is abnormal. There is severe calcifcation of the aortic valve. Aortic valve regurgitation is not visualized. Moderate aortic stenosis is present. Aortic valve mean gradient  measures 28.0 mmHg. Aortic valve peak gradient measures 48.2 mmHg. Aortic valve area, by VTI measures 1.50 cm. Pulmonic Valve: The pulmonic valve was not well visualized. Pulmonic valve regurgitation is trivial. No evidence of pulmonic stenosis. Aorta: The aortic root is normal in size and structure. Venous: The inferior vena cava is dilated in size with less than 50% respiratory variability, suggesting right atrial pressure of 15 mmHg. IAS/Shunts: No atrial level shunt detected by color flow Doppler.  LEFT VENTRICLE PLAX 2D LVIDd:         4.30 cm   Diastology LVIDs:         2.50 cm   LV e' medial:    7.18 cm/s LV PW:         1.10 cm   LV E/e' medial:  21.0 LV IVS:        1.50 cm   LV e' lateral:   8.67 cm/s LVOT diam:     1.90 cm   LV E/e' lateral: 17.4 LV SV:         107 LV SV Index:   75 LVOT Area:     2.84 cm  RIGHT VENTRICLE             IVC RV Basal diam:  4.20 cm     IVC diam: 2.80 cm RV Mid diam:    2.60 cm RV S prime:     10.50 cm/s TAPSE (M-mode): 2.0 cm LEFT ATRIUM             Index        RIGHT ATRIUM           Index LA diam:        4.50 cm 3.13 cm/m   RA Area:     21.60 cm LA Vol (A2C):   60.1 ml 41.85 ml/m  RA Volume:   63.00 ml  43.87 ml/m LA Vol (A4C):   67.0 ml 46.66 ml/m LA Biplane Vol: 63.2 ml 44.01 ml/m  AORTIC VALVE AV Area (Vmax):    1.51 cm AV Area (Vmean):   1.47 cm AV Area (VTI):     1.50 cm AV Vmax:           347.00 cm/s AV Vmean:          239.000 cm/s AV VTI:            0.713 m AV Peak Grad:      48.2 mmHg AV Mean Grad:      28.0 mmHg LVOT Vmax:         185.00 cm/s LVOT Vmean:        124.000 cm/s LVOT VTI:          0.378 m LVOT/AV VTI ratio: 0.53  AORTA Ao Root  diam: 2.75 cm Ao Asc diam:  2.90 cm MITRAL VALVE                TRICUSPID VALVE MV Area (PHT): 3.95 cm     TR Peak grad:   46.8 mmHg MV Area VTI:   3.27 cm     TR Vmax:        342.00 cm/s MV Peak grad:  12.7 mmHg MV Mean grad:  4.0 mmHg     SHUNTS MV Vmax:       1.78 m/s     Systemic VTI:  0.38 m MV Vmean:      97.4 cm/s     Systemic Diam: 1.90 cm MV Decel Time: 192 msec MV E velocity: 150.50 cm/s MV A velocity: 81.50 cm/s MV E/A ratio:  1.85 Soyla Merck MD Electronically signed by Soyla Merck MD Signature Date/Time: 10/18/2024/11:29:23 AM    Final    DG Chest Port 1 View Result Date: 10/17/2024 EXAM: 1 VIEW(S) XRAY OF THE CHEST 10/17/2024 10:34:00 PM COMPARISON: 10/17/2024 CLINICAL HISTORY: SOB (shortness of breath) FINDINGS: LUNGS AND PLEURA: Increased bilateral perihilar interstitial and airspace opacities. No pleural effusion. No pneumothorax. Limited evaluation due to rotation. HEART AND MEDIASTINUM: Cardiomegaly. BONES AND SOFT TISSUES: Right shoulder arthroplasty. ACDF noted. No acute osseous abnormality. IMPRESSION: 1. Increased bilateral perihilar interstitial and airspace opacities. Limited evaluation due to rotation. 2. Cardiomegaly. Electronically signed by: Morgane Naveau MD 10/17/2024 10:58 PM EST RP Workstation: [id number]   CT Cervical Spine Wo Contrast Result Date: 10/17/2024 EXAM: CT CERVICAL SPINE WITHOUT CONTRAST 10/17/2024 04:16:12 PM TECHNIQUE: CT of the cervical spine was performed without the administration of intravenous contrast. Multiplanar reformatted images are provided for review. Automated exposure control, iterative reconstruction, and/or weight based adjustment of the mA/kV was utilized to reduce the radiation dose to as low as reasonably achievable. COMPARISON: CT cervical spine 04/25/2020. CLINICAL HISTORY: Facial trauma, blunt. Fall. FINDINGS: CERVICAL SPINE: BONES AND ALIGNMENT: No acute fracture. Chronic grade 1 anterolisthesis of C4 on C5, C5 on C6, and C7 on T1. 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 Large chronic cyst or erosion involving the dens. Slight interval widening of the atlantodental interval measuring 2 mm and attributed to progressive arthropathy. Suspected partial fusion across the left lateral C1-C2 articulation. Progressive left  atlantooccipital arthropathy. Previous C3-C4 and C6-C7 ACDF. DEGENERATIVE CHANGES: Widespread facet arthrosis with multilevel facet ankylosis. Partially calcified ligamentous thickening/pannus posterior to the dens results in mild to moderate spinal stenosis. SOFT TISSUES: No prevertebral soft tissue swelling. LUNGS: Partially visualized extensive pulmonary ground glass opacities in the right greater than left upper lobes. IMPRESSION: 1. No acute cervical spine fracture. 2. 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 3. Partially visualized bilateral pulmonary ground-glass opacities, possibly reflecting edema or pneumonia (including viral pneumonia). Electronically signed by: Dasie Hamburg MD 10/17/2024 04:39 PM EST RP Workstation: [id number]   CT Head Wo Contrast Result Date: 10/17/2024 EXAM: CT HEAD WITHOUT CONTRAST 10/17/2024 04:16:12 PM TECHNIQUE: CT of the head was performed without the administration of intravenous contrast. Automated exposure control, iterative reconstruction, and/or weight based adjustment of the mA/kV was utilized to reduce the radiation dose to as low as reasonably achievable. COMPARISON: Head CT 02/06/2022. CLINICAL HISTORY: Delirium. FINDINGS: BRAIN AND VENTRICLES: There is no evidence of an acute infarct, intracranial hemorrhage, mass, midline shift, hydrocephalus, or extra-axial fluid collection. There is mild cerebral atrophy. Patchy to confluent hypodensities in the cerebral  white matter bilaterally are similar to the prior study and nonspecific but compatible with moderate chronic small vessel ischemic disease. Calcified atherosclerosis at the skull base. ORBITS: No acute abnormality. Bilateral cataract extraction. SINUSES: Mild mucosal thickening in the ethmoid sinuses. Clear mastoid air cells. SOFT TISSUES AND SKULL: No acute soft tissue abnormality. No skull fracture. IMPRESSION: 1.  No acute intracranial abnormality. 2. Moderate chronic small vessel ischemic disease. Electronically signed by: Dasie Hamburg MD 10/17/2024 04:22 PM EST RP Workstation: [id number]   DG Chest Port 1 View Result Date: 10/17/2024 EXAM: 1 VIEW(S) XRAY OF THE CHEST 10/17/2024 01:00:00 PM COMPARISON: Comparison 02/22/2022. CLINICAL HISTORY: Questionable sepsis - evaluate for abnormality. FINDINGS: LUNGS AND PLEURA: No focal pulmonary opacity. No pleural effusion. No pneumothorax. HEART AND MEDIASTINUM: Stable cardiomediastinal silhouette. BONES AND SOFT TISSUES: Status post right shoulder arthroplasty. No acute osseous abnormality. IMPRESSION: 1. No acute cardiopulmonary process. Electronically signed by: Lynwood Seip MD 10/17/2024 01:32 PM EST RP Workstation: [id number]     Signature  -   Lavada Stank M.D on 10/19/2024 at 9:53 AM   -  To page go to [email or web address]

## 2024-10-19 NOTE — Progress Notes (Signed)
 Physical Therapy Treatment Patient Details Name: Isabel Barnes MRN: [id number] DOB: [birth year] Today's Date: 10/19/2024   History of Present Illness Pt is an 83 y.o. female who presented 10/17/24 with generalized weakness, x2 falls, low-grade fevers, and worsening pain in her R leg with swelling and redness. Admitted with R lower extremity cellulitis and sepsis. Pt became acutely SOB 11/19 in ED and found to be in respiratory distress with troponin 4200, NSTEMI. PMH includes HTN, HLD, chronic venous insufficiency, lymphedema R>L leg, chronic lower extremity wounds, RA, hypothyroidism, anemia, R calcaneus osteomyelitis, CVA, heart murmur, GERD, OA    PT Comments  Pt tolerated treatment well today. CGA/Min A to ambulate short distance in hallway with rollator. Pt overall with good safety using brakes on rollator properly. Pt noticably fatigued with prolonged seated rest break at sink to brush teeth. No change in DC/DME recs at this time. PT will continue to follow.      If plan is discharge home, recommend the following: A little help with walking and/or transfers;A little help with bathing/dressing/bathroom;Assistance with cooking/housework;Assist for transportation;Help with stairs or ramp for entrance   Can travel by private vehicle     Yes  Equipment Recommendations  None recommended by PT    Recommendations for Other Services       Precautions / Restrictions Precautions Precautions: Fall;Other (comment) Recall of Precautions/Restrictions: Intact Precaution/Restrictions Comments: watch SpO2 Restrictions Weight Bearing Restrictions Per Provider Order: No     Mobility  Bed Mobility               General bed mobility comments: Up in recliner    Transfers Overall transfer level: Needs assistance Equipment used: Rolling walker (2 wheels) Transfers: Sit to/from Stand Sit to Stand: Min assist           General transfer comment: Min A to stand multiple times. Pt  noted with a few posterior losses of balance into chair stating that her legs felt too weak.    Ambulation/Gait Ambulation/Gait assistance: Contact guard assist, Min assist Gait Distance (Feet): 35 Feet Assistive device: Rollator (4 wheels) Gait Pattern/deviations: Step-through pattern, Decreased step length - right, Decreased step length - left, Decreased stride length, Trunk flexed Gait velocity: reduced     General Gait Details: CGA/Min A to ambulate short distance in hallway with rollator. Pt overall with good safety using brakes on rollator properly. pt noticably fatigued with prolonged seated rest break at sinlk to brush teeth.   Stairs             Wheelchair Mobility     Tilt Bed    Modified Rankin (Stroke Patients Only)       Balance Overall balance assessment: Needs assistance Sitting-balance support: Feet supported Sitting balance-Leahy Scale: Good     Standing balance support: Reliant on assistive device for balance Standing balance-Leahy Scale: Poor Standing balance comment: reliant on RW and intermittent minA                            Communication Communication Communication: No apparent difficulties  Cognition Arousal: Alert Behavior During Therapy: WFL for tasks assessed/performed                             Following commands: Intact      Cueing Cueing Techniques: Verbal cues, Tactile cues  Exercises      General Comments General comments (skin integrity, edema, etc.):  VSS      Pertinent Vitals/Pain Pain Assessment Pain Assessment: Faces Faces Pain Scale: Hurts even more Pain Location: B shoulder Pain Descriptors / Indicators: Discomfort, Grimacing, Guarding, Sore Pain Intervention(s): Monitored during session, Limited activity within patient's tolerance    Home Living                          Prior Function            PT Goals (current goals can now be found in the care plan section)  Progress towards PT goals: Progressing toward goals    Frequency    Min 3X/week      PT Plan      Co-evaluation              AM-PAC PT 6 Clicks Mobility   Outcome Measure  Help needed turning from your back to your side while in a flat bed without using bedrails?: A Little Help needed moving from lying on your back to sitting on the side of a flat bed without using bedrails?: A Lot Help needed moving to and from a bed to a chair (including a wheelchair)?: A Little Help needed standing up from a chair using your arms (e.g., wheelchair or bedside chair)?: A Little Help needed to walk in hospital room?: A Little Help needed climbing 3-5 steps with a railing? : Total 6 Click Score: 15    End of Session Equipment Utilized During Treatment: Gait belt Activity Tolerance: Patient tolerated treatment well Patient left: in chair;with call bell/phone within reach;with chair alarm set Nurse Communication: Mobility status PT Visit Diagnosis: Unsteadiness on feet (R26.81);Other abnormalities of gait and mobility (R26.89);Muscle weakness (generalized) (M62.81);Repeated falls (R29.6);History of falling (Z91.81);Difficulty in walking, not elsewhere classified (R26.2);Pain Pain - Right/Left: Right Pain - part of body: Leg     Time: 8661-8586 PT Time Calculation (min) (ACUTE ONLY): 35 min  Charges:    $Gait Training: 8-22 mins $Therapeutic Activity: 8-22 mins PT General Charges $$ ACUTE PT VISIT: 1 Visit                     Sueellen NOVAK, PT, DPT Acute Rehab Services [phone]    Remedy Corporan 10/19/2024, 3:30 PM

## 2024-10-19 NOTE — Progress Notes (Signed)
 PHARMACY - ANTICOAGULATION CONSULT NOTE  Pharmacy Consult for heparin  Indication: chest pain/ACS  Allergies  Allergen Reactions   Dilaudid  [Hydromorphone  Hcl] Shortness Of Breath   Gabapentin Other (See Comments)    Hoarseness , headache and sore throat   Latex Rash    Severe rash   Lyrica [Pregabalin] Other (See Comments)    No balance , had to walk with cane , Blurred vision,weakness.   Oxycodone  Shortness Of Breath and Other (See Comments)    Tolerated oxycodone  during admission to hospital 02/06/22   Singulair [Montelukast] Shortness Of Breath and Other (See Comments)    Vision issues, also   Ciprofloxacin Hcl Nausea And Vomiting and Other (See Comments)    Nausea and vomiting with by mouth form   Codeine Other (See Comments)    Hallucinations   Methadone Nausea And Vomiting and Other (See Comments)    Severe nausea and vomiting   Metronidazole  Nausea And Vomiting and Other (See Comments)    Gastric pain   Oysters [Shellfish Allergy] Other (See Comments)    Terrible gastric upset and cramping.   Clindamycin /Lincomycin Diarrhea and Nausea Only   Donepezil Diarrhea and Other (See Comments)    Severe diarrhea   Ferrous Sulfate      Severe gi upset, will get an infusion when needed per caregiver   Sulfa Antibiotics Diarrhea and Other (See Comments)    GI issues    Tape Other (See Comments)    ADHESIVE TAPE-Severe rash   Zetia [Ezetimibe] Diarrhea   Elemental Sulfur Nausea And Vomiting   Iodine  Rash    Duplicate entry   Other Rash    All Antibiotic ointments/ creams   Oyster Shell Rash   Povidone Iodine  Rash and Other (See Comments)    Oyster shell products- Rash    Povidone-Iodine      Duplicate entry   Skintegrity Hydrogel [Skin Protectants, Misc.] Rash   Tapentadol Other (See Comments)    Nightmares **Nucynta**    Patient Measurements: Height: 5' 2 (157.5 cm) Weight: 67 kg (147 lb 11.3 oz) IBW/kg (Calculated) : 50.1 HEPARIN  DW (KG): 46.3  Vital  Signs: Temp: 97.6 F (36.4 C) (11/21 1152) Temp Source: Oral (11/21 1152) BP: 111/94 (11/21 1152) Pulse Rate: 77 (11/21 1152)  Labs: Recent Labs    10/17/24 1204 10/17/24 2259 10/17/24 2300 10/18/24 0053 10/18/24 0443 10/18/24 1230 10/18/24 2121 10/19/24 0702  HGB 11.6* 11.7*  --   --  10.7*  --   --  10.8*  HCT 36.4 37.2  --   --  33.3*  --   --  33.3*  PLT 308 289  --   --  225  --   --  229  LABPROT 14.2  --   --   --   --   --   --   --   INR 1.0  --   --   --   --   --   --   --   HEPARINUNFRC  --   --   --   --   --  <0.10* 0.13* 0.34  CREATININE 0.96 0.93  --   --  0.96  --   --  0.94  CKTOTAL 346*  --   --   --  1,064*  --   --  365*  TROPONINIHS  --   --  5,754* 4,193*  --   --   --   --     Estimated Creatinine Clearance: 39.3 mL/min (by C-G  formula based on SCr of 0.94 mg/dL).   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   Depression    GERD (gastroesophageal reflux disease)    takes Omeprazole daily   Heart murmur    mild MS, moderate-severe AS 03/17/21 echo   History of bronchitis    20+ yrs ago   History of kidney stones    3 surgerical removed, 1 passed   History of prolapse of bladder    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 Albuterol  inhaler as needed   Spinal headache 1991   blood patch placed   Spondylitis    Unsteady gait    occasionally   Urinary urgency     Medications:  Medications Prior to Admission  Medication Sig Dispense Refill Last Dose/Taking   Calcium -Vitamin D -Vitamin K ([facility] CALCIUM  CHEWS PO) Take 1 tablet by mouth daily.   10/15/2024   cholecalciferol  (VITAMIN D ) 1000 UNITS tablet Take 1,000 Units by mouth daily.   10/15/2024   diclofenac  sodium (VOLTAREN ) 1 % GEL Apply 2 g topically daily as needed (for knee pain).   Past Week   diphenhydrAMINE  (BENADRYL ) 25 MG tablet Take 25 mg by mouth at bedtime as needed for allergies or sleep.   Unknown   HYDROcodone -acetaminophen  (NORCO) 10-325 MG tablet Take 1 tablet by mouth every 6 (six) hours. 0400, 1000, 1600, 2200   10/16/2024 Bedtime   hydrOXYzine  (ATARAX ) 25 MG tablet Take 25 mg by mouth 3 (three) times daily as needed for anxiety.   10/15/2024   Lactobacillus (PROBIOTIC ACIDOPHILUS PO) Take 1 capsule by mouth daily.   10/15/2024   levothyroxine  (SYNTHROID ) 75 MCG tablet Take 75 mcg by mouth daily.   10/15/2024   loperamide  (IMODIUM  A-D) 2 MG tablet Take 4 mg by mouth 3 (three) times daily as needed for diarrhea or loose stools.   Past Week   losartan  (COZAAR ) 25 MG tablet Take 1 tablet (25 mg total) by mouth daily. (Patient taking differently: Take 100 mg by mouth daily.) 30 tablet 0 Taking Differently   Melatonin 5 MG CHEW Chew 10 mg by mouth at bedtime.   10/15/2024   methocarbamol  (ROBAXIN ) 500 MG tablet Take 500 mg by mouth daily as needed for muscle spasms.   10/15/2024   Multiple Vitamins-Minerals (MULTIVITAMIN PO) Take 1 tablet by mouth daily.   10/15/2024   ondansetron  (ZOFRAN ) 4 MG tablet Take 1 tablet (4 mg total) by mouth 2 (two) times daily. Take before each  dose of augmentin  and can take again if needed up to 3 doses per day  (Patient taking differently: Take 4 mg by mouth 2 (two) times daily as needed for nausea or vomiting. Take before each dose of augmentin  and can take again if needed up to 3 doses per day) 90 tablet 3 10/15/2024   RESTASIS  0.05 % ophthalmic emulsion Place 1 drop into both eyes daily. 0.4 mL 0 Unknown   Wound Cleansers (VASHE WOUND THERAPY EX) Apply 1 application  topically daily. Given samples by provider   10/15/2024   amoxicillin -clavulanate (AUGMENTIN ) 875-125 MG tablet Take 1 tablet by mouth 2 (two) times daily. (Patient not taking: Reported on 10/17/2024) 60 tablet 5 Not Taking   aspirin  EC 81 MG tablet Take 1 tablet (81 mg total) by mouth daily. Swallow whole. (Patient not taking: Reported on 10/17/2024) 30 tablet 12 Not Taking   predniSONE  (DELTASONE ) 10 MG tablet TAKE 1 TABLET (10 MG TOTAL) BY MOUTH DAILY WITH BREAKFAST. (Patient not taking: Reported on 10/17/2024) 30 tablet 0 Not Taking   tetrahydrozoline 0.05 % ophthalmic solution Place 1 drop into both eyes 4 (four) times daily. (Patient not taking: Reported on 10/17/2024)   Not Taking   triamcinolone  (NASACORT ) 55 MCG/ACT AERO nasal inhaler Place 1-2 sprays into the nose 2 (two) times daily as needed (for seasonal allergies). (Patient not taking: Reported on 10/17/2024) 1 each 3 Not Taking    Assessment: 83yo female admitted for sepsis, while awaiting bed in ED became acutely SOB with tacycardia and troponin found to be significantly elevated. Pharmacy consulted to start heparin . Heparin  level this morning is therapeutic at 0.34 on 850 units/hr. No issues with infusion or bleeding reported. CBC stable  Goal of Therapy:  Heparin  level 0.3-0.7 units/ml Monitor platelets by anticoagulation protocol: Yes   Plan:  Continue heparin  infusion at 850 units/hr Check confirmatory anti-Xa level in 8 hours and daily while on heparin  Continue to monitor H&H and platelets   Thank you for allowing pharmacy to be a part of this patient's care.    Bascom JAYSON Louder, PharmD 10/19/2024 12:11 PM  **Pharmacist phone directory can be found on [email or web address] listed under [facility]**

## 2024-10-19 NOTE — Progress Notes (Signed)
 PT Cancellation Note  Patient Details Name: Isabel Barnes MRN: [id number] DOB: 08/05/1941   Cancelled Treatment:    Reason Eval/Treat Not Completed: Patient at procedure or test/unavailable (First attempt pt was getting ultrasound performed. Now pt about to go to CT. Will follow up later if time allows.)   Sabrine Patchen 10/19/2024, 10:46 AM

## 2024-10-19 NOTE — Progress Notes (Signed)
 VASCULAR LAB    ABI has been performed.  See CV proc for preliminary results.   Sharnell Knight, RVT 10/19/2024, 10:12 AM

## 2024-10-19 NOTE — Plan of Care (Signed)

## 2024-10-19 NOTE — Progress Notes (Signed)
 Heart Failure Navigator Progress Note  Assessed for Heart & Vascular TOC clinic readiness.   Patient does not meet criteria due to [facility] appt 12/10. HF exacerbation secondary to aortic stenosis and IVF for sepsis. Only required 1 dose of IV lasix . EF 65-70%.  Navigator available for reassessment of patient.   Duwaine Plant, PharmD, BCPS Heart Failure Stewardship Pharmacist Phone [phone]

## 2024-10-19 NOTE — NC FL2 (Signed)
 [place]  MEDICAID FL2 LEVEL OF CARE FORM     IDENTIFICATION  Patient Name: Isabel Barnes Birthdate: 09/15/1941 Sex: female Admission Date (Current Location): 10/17/2024  [facility] and [place] Number:  Producer, Television/film/video and Address:  [facility], 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   Venous ulcer of right leg (HCC) 04/16/2024   Medication management 04/16/2024   Fungal infection 04/16/2024   Rash 01/15/2024   Rheumatoid arthritis (HCC) 12/19/2023   Acute osteomyelitis of right calcaneus (HCC) 12/18/2023   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   RLE Wound  R Heel Ulcer 09/08/2023   LLE Cellulitis 09/07/2023   Abscess of right lower leg    Severe protein-calorie malnutrition    Peripheral arterial disease    Fall    Atrial fibrillation (HCC)    Goals of care, counseling/discussion    Anemia    Fluid collection (edema) in the arms, legs, hands and feet    Cellulitis of right lower extremity 02/06/2022   Chest pain, rule out acute myocardial infarction 03/16/2021   Hypertension 03/16/2021   Hypothyroidism 03/16/2021   Hyperlipemia 03/16/2021   Aortic valve stenosis 03/16/2021   Chronic venous insufficiency of lower extremity 03/16/2021   Short-term memory loss 12/13/2019   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   Primary osteoarthritis of right knee 07/16/2016   Eosinophilia 06/16/2015   Chronic pain disorder     Osteoarthritis    Incisional umbilical hernia, without obstruction or gangrene    Iron deficiency anemia 09/19/2012    Orientation RESPIRATION BLADDER Height & Weight     Self, Place, Situation  Normal Incontinent Weight: 147 lb 11.3 oz (67 kg) Height:  5' 2 (157.5 cm)  BEHAVIORAL SYMPTOMS/MOOD NEUROLOGICAL BOWEL NUTRITION STATUS      Continent Diet (See D/C summary)  AMBULATORY STATUS COMMUNICATION OF NEEDS Skin   Limited Assist Verbally PU Stage and Appropriate Care (PU to R heel and chronic infected wound on inside of R leg)                       Personal Care Assistance Level of Assistance  Bathing Bathing Assistance: Limited assistance         Functional Limitations Info  Sight Sight Info: Impaired        SPECIAL CARE FACTORS FREQUENCY                       Contractures      Additional Factors Info  Code Status, Allergies Code Status Info: DNR limited Allergies Info: Dilaudid ; Gabapentin; Latex; Lyrica; Oxycodone , Singulair; Cipro; Codeine; Metronidazole ; Oysters/Shellfish; Clindamycin /lincomycin; Donepezil; Ferrous Sulfate ; Sulfate antibiotics; Tape; Zetia; Elemental Sulfur; Iodine ; All Antibiotic ointments/ creams; Oyster shell; Povidone Iodine ; Skintegrity Hydrogel; Tapentadol           Current Medications (10/19/2024):  This is the current hospital active medication list Current Facility-Administered Medications  Medication Dose Route Frequency Provider  Last Rate Last Admin   acetaminophen  (TYLENOL ) tablet 650 mg  650 mg Oral Q6H PRN Singh, Prashant K, MD       Or   acetaminophen  (TYLENOL ) suppository 650 mg  650 mg Rectal Q6H PRN Singh, Prashant K, MD       amoxicillin -clavulanate (AUGMENTIN ) 500-125 MG per tablet 1 tablet  1 tablet Oral BID Vu, Trung T, MD       aspirin  EC tablet 81 mg  81 mg Oral Daily Singh, Prashant K, MD   81 mg at 10/19/24 1020   cycloSPORINE  (RESTASIS ) 0.05 % ophthalmic emulsion 1 drop  1 drop Both Eyes Daily Singh,  Prashant K, MD   1 drop at 10/19/24 1020   diphenhydrAMINE  (BENADRYL ) capsule 50 mg  50 mg Oral Once Singh, Prashant K, MD       Or   diphenhydrAMINE  (BENADRYL ) injection 50 mg  50 mg Intravenous Once Singh, Prashant K, MD       heparin  ADULT infusion 100 units/mL (25000 units/250mL)  850 Units/hr Intravenous Continuous Al-Sultani, Anmar, MD 8.5 mL/hr at 10/19/24 0648 850 Units/hr at 10/19/24 0648   HYDROcodone -acetaminophen  (NORCO) 10-325 MG per tablet 1 tablet  1 tablet Oral Q6H Singh, Prashant K, MD   1 tablet at 10/19/24 1218   hydrOXYzine  (ATARAX ) tablet 25 mg  25 mg Oral TID PRN Singh, Prashant K, MD       linezolid  (ZYVOX ) tablet 600 mg  600 mg Oral Q12H Vu, Constance DASEN, MD       methocarbamol  (ROBAXIN ) tablet 500 mg  500 mg Oral Q8H PRN Singh, Prashant K, MD       [START ON 10/20/2024] methylPREDNISolone  sodium succinate  (SOLU-MEDROL ) 125 mg/2 mL injection 60 mg  60 mg Intravenous Q24H Singh, Prashant K, MD       naphazoline-glycerin  (CLEAR EYES REDNESS) ophth solution 2 drop  2 drop Both Eyes QID PRN Singh, Prashant K, MD       nitroGLYCERIN  (NITROSTAT ) SL tablet 0.4 mg  0.4 mg Sublingual Q5 min PRN Sofia, Leslie K, PA-C   0.4 mg at 10/17/24 1429   ondansetron  (ZOFRAN ) tablet 4 mg  4 mg Oral Q6H PRN Singh, Prashant K, MD       Or   ondansetron  (ZOFRAN ) injection 4 mg  4 mg Intravenous Q6H PRN Singh, Prashant K, MD       triamcinolone  (NASACORT ) nasal inhaler 1-2 spray  1-2 spray Nasal BID PRN Singh, Prashant K, MD         Discharge Medications: Please see discharge summary for a list of discharge medications.  Relevant Imaging Results:  Relevant Lab Results:   Additional Information SSN# [id number]  Raechal Raben, [address]

## 2024-10-19 NOTE — TOC Initial Note (Signed)
 Transition of Care [facility]) - Initial/Assessment Note    Patient Details  Name: Isabel Barnes MRN: [id number] Date of Birth: 1941-02-19  Transition of Care [facility]) CM/SW Contact:    Bernardino Dean, LCSWA Phone Number: 10/19/2024, 2:13 PM  Clinical Narrative:                 Patient presents to the hospital after being found down on the floor by their caregiver Burnard. Patient has a hx of chronically infected LE wounds and has been experiencing weakness with frequent falls over the last week or so. Found to have sepsis and to have had an NSTEMI. At home supports include [facility] PT/OT 1x/week, church group, and caregiver Burnard for 4 hours/day 4 days/week. No local family, son is in [place] and daughter in [place].   CSW received consult for potential SNF placement. Met with patient and Burnard at bedside, both appeared amenable to SNF though patient with noted memory deficits. They directed SW to speak with daughter Katheryn [phone]). Briefly discussed possibility of SNF placement with Katheryn, though she noted a negative previous experience at [facility] and asked to speak to palliative care to better identify goals of care, comfort focus vs. difficult rehab journey. Attending notified of consult request.   FL2 completed in preparation for potential SNF discharge. Referrals on hold until GOC better understood. TOC following.   Expected Discharge Plan: Skilled Nursing Facility Barriers to Discharge: Insurance Authorization, No SNF bed, Continued Medical Work up   Patient Goals and CMS Choice Patient states their goals for this hospitalization and ongoing recovery are:: TBD; pending progress and discussion of goals   Choice offered to / list presented to : NA      Expected Discharge Plan and Services In-house Referral: Clinical Social Work   Post Acute Care Choice: NA Living arrangements for the past 2 months: Single Family Home                                      Prior Living  Arrangements/Services Living arrangements for the past 2 months: Single Family Home Lives with:: Self, Other (Comment) (Caregiver 4x/week for 4 hours/day) Patient language and need for interpreter reviewed:: Yes        Need for Family Participation in Patient Care: Yes (Comment) Care giver support system in place?: Yes (comment) Current home services: DME Criminal Activity/Legal Involvement Pertinent to Current Situation/Hospitalization: No - Comment as needed  Activities of Daily Living   ADL Screening (condition at time of admission) Independently performs ADLs?: Yes (appropriate for developmental age)  Permission Sought/Granted                  Emotional Assessment Appearance:: Appears stated age Attitude/Demeanor/Rapport: Gracious Affect (typically observed): Appropriate Orientation: : Oriented to Self, Oriented to Place Alcohol  / Substance Use: Not Applicable Psych Involvement: No (comment)  Admission diagnosis:  Cellulitis of leg, right [L03.115] Sepsis (HCC) [A41.9] Sepsis, due to unspecified organism, unspecified whether acute organ dysfunction present [facility]) [A41.9] Patient Active Problem List   Diagnosis Date Noted   Sepsis (HCC) 10/17/2024   History of revision of total knee arthroplasty 05/17/2024   Venous ulcer of right leg (HCC) 04/16/2024   Medication management 04/16/2024   Fungal infection 04/16/2024   Rash 01/15/2024   Rheumatoid arthritis (HCC) 12/19/2023   Acute osteomyelitis of right calcaneus (HCC) 12/18/2023   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  RLE Wound  R Heel Ulcer 09/08/2023   LLE Cellulitis 09/07/2023   Abscess of right lower leg    Severe protein-calorie malnutrition    Peripheral arterial disease    Fall    Atrial fibrillation (HCC)    Goals of care, counseling/discussion    Anemia    Fluid collection (edema) in the arms, legs, hands and feet    Cellulitis of right lower extremity 02/06/2022   Chest pain, rule out  acute myocardial infarction 03/16/2021   Hypertension 03/16/2021   Hypothyroidism 03/16/2021   Hyperlipemia 03/16/2021   Aortic valve stenosis 03/16/2021   Chronic venous insufficiency of lower extremity 03/16/2021   Short-term memory loss 12/13/2019   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   Primary osteoarthritis of right knee 07/16/2016   Eosinophilia 06/16/2015   Chronic pain disorder    Osteoarthritis    Incisional umbilical hernia, without obstruction or gangrene    Iron deficiency anemia 09/19/2012   PCP:  [place], PA-C Pharmacy:   [id number] - [place], [place] - [address] [place] [address] Phone: [phone] Fax: [phone]  [facility] [address] [place] [address] Phone: [phone] Fax: [phone]     Social Drivers of Health (SDOH) Social History: SDOH Screenings   Food Insecurity: Low Risk  (10/15/2024)   Received from [facility]  Housing: Low Risk  (10/15/2024)   Received from [facility]  Transportation Needs: No Transportation Needs (10/15/2024)   Received from [facility]  Utilities: Low Risk  (10/15/2024)   Received from [facility]  Depression (PHQ2-9): Low Risk  (12/28/2023)  Financial Resource Strain: Low Risk  (05/25/2024)   Received from [facility]  Physical Activity: Unknown (05/25/2024)   Received from [facility]  Social Connections: Somewhat Isolated (05/25/2024)   Received from [facility]  Stress: No Stress Concern Present (05/25/2024)   Received from [facility]  Tobacco Use: Medium Risk (10/17/2024)   SDOH Interventions:     Readmission Risk Interventions    09/16/2023    1:44 PM 09/09/2023    3:51 PM  Readmission Risk Prevention Plan  Post Dischage Appt  Complete  Medication Screening  Complete  Transportation Screening Complete Complete  PCP or Specialist Appt within 5-7 Days Complete   Home Care Screening Complete    Medication Review (RN CM) Complete

## 2024-10-19 NOTE — Evaluation (Signed)
 Occupational Therapy Evaluation Patient Details Name: Isabel Barnes MRN: [id number] DOB: 1941/11/14 Today's Date: 10/19/2024   History of Present Illness   Pt is an 83 y.o. female who presented 10/17/24 with generalized weakness, x2 falls, low-grade fevers, and worsening pain in her R leg with swelling and redness. Admitted with R lower extremity cellulitis and sepsis. Pt became acutely SOB 11/19 in ED and found to be in respiratory distress with troponin 4200, NSTEMI. PMH includes HTN, HLD, chronic venous insufficiency, lymphedema R>L leg, chronic lower extremity wounds, RA, hypothyroidism, anemia, R calcaneus osteomyelitis, CVA, heart murmur, GERD, OA     Clinical Impressions Patient admitted for the diagnosis above.  PTA she lives alone, her daughter lives in [place].  She has a PCA for 4x/wk and 4 hours each day.  Patient was undergoing HH PT, and would like to continue that.  Patient presents with the deficits listed below, and is performing under her baseline, Min A for bed mobility, Min A for sit to stand and CGA for transfers at [facility].  OT will continue efforts in the acute setting to address deficits, and HH OT can be considered if the patient agrees.       If plan is discharge home, recommend the following:   Assist for transportation;A little help with walking and/or transfers;A little help with bathing/dressing/bathroom     Functional Status Assessment   Patient has had a recent decline in their functional status and demonstrates the ability to make significant improvements in function in a reasonable and predictable amount of time.     Equipment Recommendations   None recommended by OT     Recommendations for Other Services         Precautions/Restrictions   Precautions Precautions: Fall;Other (comment) Recall of Precautions/Restrictions: Intact Precaution/Restrictions Comments: watch SpO2 Restrictions Weight Bearing Restrictions Per Provider Order:  No     Mobility Bed Mobility Overal bed mobility: Needs Assistance Bed Mobility: Supine to Sit     Supine to sit: Used rails, Min assist       Patient Response: Cooperative  Transfers Overall transfer level: Needs assistance Equipment used: Rolling walker (2 wheels) Transfers: Sit to/from Stand, Bed to chair/wheelchair/BSC Sit to Stand: Min assist     Step pivot transfers: Contact guard assist            Balance Overall balance assessment: Needs assistance Sitting-balance support: Feet supported Sitting balance-Leahy Scale: Good     Standing balance support: Reliant on assistive device for balance Standing balance-Leahy Scale: Poor Standing balance comment: reliant on RW and intermittent minA                           ADL either performed or assessed with clinical judgement   ADL       Grooming: Wash/dry hands;Wash/dry face;Set up;Sitting           Upper Body Dressing : Minimal assistance;Sitting   Lower Body Dressing: Minimal assistance;Sit to/from stand   Toilet Transfer: Minimal assistance;Rolling walker (2 wheels);Ambulation;Regular Toilet                   Vision Patient Visual Report: No change from baseline       Perception Perception: Not tested       Praxis Praxis: Not tested       Pertinent Vitals/Pain Pain Assessment Pain Assessment: Faces Faces Pain Scale: Hurts even more Pain Location: B shoulder Pain Descriptors / Indicators: Discomfort, Grimacing,  Guarding, Sore Pain Intervention(s): Monitored during session     Extremity/Trunk Assessment Upper Extremity Assessment Upper Extremity Assessment: Generalized weakness;Right hand dominant;RUE deficits/detail;LUE deficits/detail RUE Deficits / Details: able to touch top of her head RUE Sensation: WNL RUE Coordination: WNL LUE Deficits / Details: limited shoulder flexion LUE Sensation: WNL LUE Coordination: WNL   Lower Extremity Assessment Lower Extremity  Assessment: Defer to PT evaluation   Cervical / Trunk Assessment Cervical / Trunk Assessment: Kyphotic   Communication Communication Communication: No apparent difficulties   Cognition Arousal: Alert Behavior During Therapy: WFL for tasks assessed/performed Cognition: No apparent impairments                               Following commands: Intact       Cueing  General Comments   Cueing Techniques: Verbal cues;Tactile cues      Exercises     Shoulder Instructions      Home Living Family/patient expects to be discharged to:: Private residence Living Arrangements: Alone Available Help at Discharge: Personal care attendant;Friend(s);Available PRN/intermittently;Neighbor Type of Home: House Home Access: Ramped entrance     Home Layout: One level     Bathroom Shower/Tub: Producer, Television/film/video: Standard Bathroom Accessibility: Yes How Accessible: Accessible via walker Home Equipment: Rolling Walker (2 wheels);Rollator (4 wheels);BSC/3in1;Shower seat - built in;Shower seat;Grab bars - tub/shower;Grab bars - toilet;Transport chair          Prior Functioning/Environment Prior Level of Function : History of Falls (last six months);Needs assist             Mobility Comments: Mod I with rollator; x2 falls in past 6 months ADLs Comments: Ind for ADLs, does not shower unless PCA is there to supervise, assist for iADLs; does not drive.  Light meal prep and home management.  Has assist with bills and medications    OT Problem List: Decreased strength;Decreased range of motion;Decreased activity tolerance;Impaired balance (sitting and/or standing);Pain   OT Treatment/Interventions: Self-care/ADL training;Therapeutic activities;Patient/family education;Balance training;DME and/or AE instruction      OT Goals(Current goals can be found in the care plan section)   Acute Rehab OT Goals Patient Stated Goal: Return home OT Goal Formulation: With  patient Time For Goal Achievement: 11/02/24 Potential to Achieve Goals: Good ADL Goals Pt Will Perform Grooming: with modified independence;standing Pt Will Perform Lower Body Dressing: with supervision;sit to/from stand Pt Will Transfer to Toilet: with modified independence;ambulating;regular height toilet   OT Frequency:  Min 2X/week    Co-evaluation              AM-PAC OT 6 Clicks Daily Activity     Outcome Measure Help from another person eating meals?: None Help from another person taking care of personal grooming?: A Little Help from another person toileting, which includes using toliet, bedpan, or urinal?: A Little Help from another person bathing (including washing, rinsing, drying)?: A Little Help from another person to put on and taking off regular upper body clothing?: A Little Help from another person to put on and taking off regular lower body clothing?: A Little 6 Click Score: 19   End of Session Equipment Utilized During Treatment: Rolling walker (2 wheels) Nurse Communication: Mobility status  Activity Tolerance: Patient tolerated treatment well Patient left: in chair;with call bell/phone within reach;with family/visitor present  OT Visit Diagnosis: Unsteadiness on feet (R26.81);Muscle weakness (generalized) (M62.81);Pain Pain - Right/Left: Right Pain - part of body: Shoulder  Time: 8974-8952 OT Time Calculation (min): 22 min Charges:  OT General Charges $OT Visit: 1 Visit OT Evaluation $OT Eval Moderate Complexity: 1 Mod  10/19/2024  RP, OTR/L  Acute Rehabilitation Services  Office:  [phone]   Charlie JONETTA Halsted 10/19/2024, 10:57 AM

## 2024-10-19 NOTE — Progress Notes (Addendum)
 VASCULAR LAB    Arrived to patient's room at 0915 to do ABI.  However, patient and patient's bed needs to be cleaned up.  Will re-attempt as schedule and patient availability permit.   Severn Goddard, RVT 10/19/2024, 9:24 AM

## 2024-10-19 NOTE — Consult Note (Signed)
 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 Would benefit from further vascular/dermatology evaluation  Cellulitis improving  Calcaneal chronic om probably will not respond to abx alone.   She follows wound care with dr Harden  Would treat for 10 days and can transition to oral abx today   Bcx this admission and respiratory viral pcr negative    Cad/nstemi2 dyspnea-hypox resp failure in setting moderate AS/HFpEF.. cardiology follows -- demand ischemia/chf improving  Plan: Stop iv abx Continue linezolid  in oral form 600 mg po bid; start amox-clav bid Plan 7 more days until 11/28 for cellulitis; wouldn't treat long term for imaging suggestion previously chronic OM Maintain standard isolation precaution She has id clinic f/u with dr Fleeta Rothman Advise outpatient vascular and derm evaluation chronic leg wound Will sign off Discussed with primary team      ------------------------------------------------ Principal Problem:   Sepsis (HCC)    HPI: Isabel Barnes is a 83 y.o. 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, chronic right leg ulcer/open wound, admitted 11/19 for sepsis in setting 1 week heel ulcer discharge and increasing pain around rle ulcer and redness   She has been followed by dr  Fleeta Rothman for chronic calcaneal om and dr Harden for chronic wound She has had abi in the past year showing severe vascular disease   She has yet to see dermatology or vascular surgery formally   The ulcer had pretty much stay stable the past 2 year   Past week she has increasing pain/redness rle around ulcer and also discharge from right ankle  She was taking augmentin  for a week but minimal if any improvement  Denies f/c  Reports dyspnea   On admission: Febrile No leukocytosis Xray chest volume overload No imaging of rle  Bcx negative Trops up / hypoxemic resp distress -cards following - improving  bsAbx started improving cellulitis changes rle    Family History  Problem Relation Age of Onset   Stroke Father     Social History   Tobacco Use   Smoking status: Former    Current packs/day: 0.00    Average packs/day: 0.5 packs/day for 2.0 years (1.0 ttl pk-yrs)    Types: Cigarettes    Start date: 02/06/1990    Quit date: 02/07/1992    Years since quitting: 32.7   Smokeless tobacco: Never  Vaping Use   Vaping status: Never Used  Substance Use Topics   Alcohol  use: No   Drug use: No    Allergies  Allergen Reactions   Dilaudid  [Hydromorphone  Hcl] Shortness Of Breath   Gabapentin Other (See Comments)    Hoarseness , headache and sore throat   Latex Rash    Severe rash   Lyrica [Pregabalin] Other (See Comments)    No balance ,  had to walk with cane , Blurred vision,weakness.   Oxycodone  Shortness Of Breath and Other (See Comments)    Tolerated oxycodone  during admission to hospital 02/06/22   Singulair [Montelukast] Shortness Of Breath and Other (See Comments)    Vision issues, also   Ciprofloxacin Hcl Nausea And Vomiting and Other (See Comments)    Nausea and vomiting with by mouth form   Codeine Other (See Comments)    Hallucinations   Methadone Nausea And Vomiting and Other (See Comments)    Severe nausea and vomiting   Metronidazole  Nausea And Vomiting  and Other (See Comments)    Gastric pain   Oysters [Shellfish Allergy] Other (See Comments)    Terrible gastric upset and cramping.   Clindamycin /Lincomycin Diarrhea and Nausea Only   Donepezil Diarrhea and Other (See Comments)    Severe diarrhea   Ferrous Sulfate      Severe gi upset, will get an infusion when needed per caregiver   Sulfa Antibiotics Diarrhea and Other (See Comments)    GI issues    Tape Other (See Comments)    ADHESIVE TAPE-Severe rash   Zetia [Ezetimibe] Diarrhea   Elemental Sulfur Nausea And Vomiting   Iodine  Rash    Duplicate entry   Other Rash    All Antibiotic ointments/ creams   Oyster Shell Rash   Povidone Iodine  Rash and Other (See Comments)    Oyster shell products- Rash    Povidone-Iodine      Duplicate entry   Skintegrity Hydrogel [Skin Protectants, Misc.] Rash   Tapentadol Other (See Comments)    Nightmares **Nucynta**    Review of Systems: ROS All Other ROS was negative, except mentioned above   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   Depression    GERD (gastroesophageal reflux disease)    takes Omeprazole daily   Heart murmur    mild MS, moderate-severe AS 03/17/21 echo   History of bronchitis    20+ yrs ago   History of kidney stones    3 surgerical removed, 1 passed   History of prolapse of bladder    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 Albuterol  inhaler as needed   Spinal headache 1991   blood patch placed   Spondylitis    Unsteady gait    occasionally   Urinary urgency        Scheduled Meds:  amoxicillin -clavulanate  1 tablet Oral BID   aspirin  EC  81 mg Oral Daily   cycloSPORINE   1 drop Both Eyes Daily   diphenhydrAMINE   50 mg Oral Once   Or   diphenhydrAMINE   50 mg Intravenous Once   HYDROcodone -acetaminophen   1 tablet Oral Q6H   linezolid   600 mg Oral Q12H   [START ON 10/20/2024] methylPREDNISolone  (SOLU-MEDROL ) injection  60 mg Intravenous Q24H   rosuvastatin   10 mg Oral Daily   Continuous Infusions:  heparin  850 Units/hr (10/19/24 1450)   PRN Meds:.acetaminophen  **OR** acetaminophen , hydrOXYzine , methocarbamol , naphazoline-glycerin , nitroGLYCERIN , ondansetron  **OR** ondansetron  (ZOFRAN ) IV, triamcinolone    OBJECTIVE: Blood pressure (!) 111/94, pulse 77, temperature 97.6 F (36.4 C), temperature source Oral, resp. rate 20, height 5' 2 (1.575 m), weight 67 kg, SpO2 97%.  Physical Exam  General/constitutional: no distress, pleasant HEENT: Normocephalic, PER, Conj Clear, EOMI, Oropharynx clear Neck supple CV: rrr no mrg Lungs: clear to auscultation, normal respiratory effort Abd: Soft, Nontender Ext: bilateral chronic edema Skin: legs wrapped; pictures reviewed       Neuro: nonfocal MSK: no peripheral joint swelling/tenderness/warmth; back spines nontender     Lab Results Lab Results  Component Value Date   WBC 22.2 (H) 10/19/2024   HGB 10.8 (L) 10/19/2024   HCT 33.3 (L) 10/19/2024   MCV 87.4 10/19/2024   PLT 229 10/19/2024    Lab Results  Component Value Date   CREATININE 0.94 10/19/2024   BUN 27 (H) 10/19/2024   NA 138 10/19/2024   K 3.9  10/19/2024   CL 104 10/19/2024   CO2 18 (L) 10/19/2024    Lab Results  Component Value Date   ALT 68 (H) 10/19/2024   AST 80 (H) 10/19/2024   ALKPHOS 98 10/19/2024   BILITOT 0.6 10/19/2024      Microbiology: Recent Results (from the past 240 hours)  Culture, blood (Routine x 2)     Status: None (Preliminary result)   Collection Time: 10/17/24 12:02 PM   Specimen: BLOOD LEFT ARM  Result Value Ref Range Status   Specimen Description BLOOD LEFT ARM  Final   Special Requests   Final    BOTTLES DRAWN AEROBIC AND ANAEROBIC Blood Culture results may not be optimal due to an inadequate volume of blood received in culture bottles   Culture   Final    NO GROWTH 2 DAYS Performed at [facility], [address] [place], [place] [address]    Report Status PENDING  Incomplete  Culture, blood (Routine x 2)     Status: None (Preliminary result)   Collection Time: 10/17/24 12:02 PM   Specimen: BLOOD RIGHT ARM  Result Value Ref Range Status   Specimen Description BLOOD RIGHT ARM  Final   Special Requests   Final    AEROBIC BOTTLE ONLY Blood Culture results may not be optimal due to an inadequate volume of blood received in culture bottles   Culture   Final    NO GROWTH 2 DAYS Performed at [facility], [address], [place], [place] [address]    Report Status PENDING  Incomplete  Resp panel by RT-PCR (RSV, Flu A&B, Covid) Anterior Nasal Swab     Status: None   Collection Time: 10/17/24 12:17 PM   Specimen: Anterior Nasal Swab  Result Value Ref Range Status   SARS Coronavirus 2 by RT PCR NEGATIVE NEGATIVE Final   Influenza A by PCR NEGATIVE NEGATIVE Final   Influenza B by PCR NEGATIVE NEGATIVE Final    Comment: (NOTE) The Xpert Xpress SARS-CoV-2/FLU/RSV plus assay is intended as an aid in the  diagnosis of influenza from Nasopharyngeal swab specimens and should not be used as a sole basis for treatment. Nasal washings and aspirates are unacceptable for Xpert Xpress  SARS-CoV-2/FLU/RSV testing.  Fact Sheet for Patients: bloggercourse.com  Fact Sheet for Healthcare Providers: seriousbroker.it  This test is not yet approved or cleared by the [place]  FDA and has been authorized for detection and/or diagnosis of SARS-CoV-2 by FDA under an Emergency Use Authorization (EUA). This EUA will remain in effect (meaning this test can be used) for the duration of the COVID-19 declaration under Section 564(b)(1) of the Act, 21 U.S.C. section 360bbb-3(b)(1), unless the authorization is terminated or revoked.     Resp Syncytial Virus by PCR NEGATIVE NEGATIVE Final    Comment: (NOTE) Fact Sheet for Patients: bloggercourse.com  Fact Sheet for Healthcare Providers: seriousbroker.it  This test is not yet approved or cleared by the [place]  FDA and has been authorized for detection and/or diagnosis of SARS-CoV-2 by FDA under an Emergency Use Authorization (EUA). This EUA will remain in effect (meaning this test can be used) for the duration of the COVID-19 declaration under Section 564(b)(1) of the Act, 21 U.S.C. section 360bbb-3(b)(1), unless the authorization is terminated or revoked.  Performed at [facility], [address], [place], [place] [address]   Respiratory (~20 pathogens) panel by PCR     Status: None   Collection Time: 10/17/24 12:17 PM   Specimen: Nasopharyngeal Swab; Respiratory  Result Value Ref Range Status   Adenovirus NOT DETECTED NOT DETECTED Final   Coronavirus 229E NOT DETECTED NOT DETECTED Final    Comment: (NOTE) The Coronavirus on the Respiratory Panel, DOES NOT test for the novel  Coronavirus (2019 nCoV)    Coronavirus HKU1 NOT DETECTED NOT DETECTED Final   Coronavirus NL63 NOT DETECTED NOT DETECTED Final   Coronavirus OC43 NOT DETECTED NOT DETECTED Final   Metapneumovirus NOT DETECTED NOT DETECTED Final    Rhinovirus / Enterovirus NOT DETECTED NOT DETECTED Final   Influenza A NOT DETECTED NOT DETECTED Final   Influenza B NOT DETECTED NOT DETECTED Final   Parainfluenza Virus 1 NOT DETECTED NOT DETECTED Final   Parainfluenza Virus 2 NOT DETECTED NOT DETECTED Final   Parainfluenza Virus 3 NOT DETECTED NOT DETECTED Final   Parainfluenza Virus 4 NOT DETECTED NOT DETECTED Final   Respiratory Syncytial Virus NOT DETECTED NOT DETECTED Final   Bordetella pertussis NOT DETECTED NOT DETECTED Final   Bordetella Parapertussis NOT DETECTED NOT DETECTED Final   Chlamydophila pneumoniae NOT DETECTED NOT DETECTED Final   Mycoplasma pneumoniae NOT DETECTED NOT DETECTED Final    Comment: Performed at [facility], [address] [place], [place] [address]  C Difficile Quick Screen w PCR reflex     Status: Abnormal   Collection Time: 10/17/24  5:28 PM   Specimen: STOOL  Result Value Ref Range Status   C Diff antigen POSITIVE (A) NEGATIVE Final   C Diff toxin NEGATIVE NEGATIVE Final   C Diff interpretation Results are indeterminate. See PCR results.  Final    Comment: Performed at [facility], [address], [place], [place] [address]  C. Diff by PCR, Reflexed     Status: None   Collection Time: 10/17/24  5:28 PM  Result Value Ref Range Status   Toxigenic C. Difficile by PCR NEGATIVE NEGATIVE Final    Comment: Patient is colonized with non toxigenic C. difficile. May not need treatment unless significant symptoms are present.   Hypervirulent  Strain PRESUMPTIVE NEGATIVE PRESUMPTIVE NEGATIVE Final    Comment: Performed at [facility], [address], [place], [place] [address]  MRSA Next Gen by PCR, Nasal     Status: None   Collection Time: 10/18/24  4:14 AM   Specimen: Nasal Mucosa; Nasal Swab  Result Value Ref Range Status   MRSA by PCR Next Gen NOT DETECTED NOT DETECTED Final    Comment: (NOTE) The GeneXpert MRSA Assay (FDA approved for NASAL specimens only), is one component of a  comprehensive MRSA colonization surveillance program. It is not intended to diagnose MRSA infection nor to guide or monitor treatment for MRSA infections. Test performance is not FDA approved in patients less than 66 years old. Performed at [facility], [address], [place], [place] [address]      Serology:    Imaging: If present, new imagings (plain films, ct scans, and mri) have been personally visualized and interpreted; radiology reports have been reviewed. Decision making incorporated into the Impression / Recommendations.  11/19 cxr 1. Increased bilateral perihilar interstitial and airspace opacities. Limited evaluation due to rotation. 2. Cardiomegaly.    08/2023 mri right lower ext 1. As seen on prior radiographs, there is smooth cortical thickening of the anteromedial tibial diaphysis in the region of a medial calf soft tissue wound that measures up to approximately 13.5 cm in craniocaudal dimension. 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 No definite extension into the deeper bone marrow. 2. There is diffuse right calf moderate to high-grade subcutaneous fat edema and swelling with associated skin thickening. This is compatible with diffuse cellulitis. Note is made that similar and likely even higher grade edema and swelling were seen on prior 10/18/2020 ankle MRI.  Constance ONEIDA Passer, MD [facility] [phone] pager    10/19/2024, 3:11 PM

## 2024-10-19 NOTE — Consult Note (Signed)
 Reason for Consult: Right lower extremity cellulitis, chronic wound Referring Physician: Dr. Lavada Stank  Isabel Barnes is an 83 y.o. female.  HPI: Isabel Barnes is a very pleasant 83 year old female with multiple comorbidities who was admitted to the hospital 2 days ago with severe septicemia.  She had a right lower extremity cellulitis that is currently being treated with antibiotics.  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  She has been followed for several years by Dr. Harden for these wounds and she states that she has gone to the operating room twice for debridement.  She undergoes daily dressing changes at home some of which she does herself.  She denies any pain in the leg.  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   Depression    GERD (gastroesophageal reflux disease)    takes Omeprazole daily   Heart murmur    mild MS, moderate-severe AS 03/17/21 echo   History of bronchitis    20+ yrs ago   History of kidney stones    3 surgerical removed, 1 passed   History of prolapse of bladder    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 Albuterol  inhaler as needed   Spinal headache 1991   blood patch placed   Spondylitis    Unsteady gait    occasionally   Urinary urgency     Past Surgical History:  Procedure Laterality Date   ABDOMINAL AORTOGRAM W/LOWER EXTREMITY N/A 02/15/2022   Procedure: ABDOMINAL AORTOGRAM W/LOWER EXTREMITY;  Surgeon: Sheree Penne Bruckner, MD;  Location: [facility] INVASIVE CV LAB;  Service: Cardiovascular;  Laterality: N/A;   ABDOMINAL AORTOGRAM W/LOWER EXTREMITY N/A 09/12/2023   Procedure: ABDOMINAL AORTOGRAM W/LOWER EXTREMITY;  Surgeon: Lanis Fonda BRAVO, MD;  Location: [facility] INVASIVE CV LAB;  Service: Cardiovascular;  Laterality: N/A;   ABDOMINAL HYSTERECTOMY     ANTERIOR FUSION CERVICAL SPINE     x2 -C4-7   APPLICATION OF WOUND VAC Right 03/12/2022   Procedure: APPLICATION OF WOUND VAC;  Surgeon: Harden Jerona GAILS, MD;  Location: [facility] OR;  Service: Orthopedics;  Laterality: Right;   BUNIONECTOMY Bilateral    COLONOSCOPY     CYSTOSCOPY W/ URETEROSCOPY  2012   EYE SURGERY Bilateral    cataract /lens implant   HOLMIUM LASER APPLICATION Left 02/08/2013   Procedure: HOLMIUM LASER APPLICATION;  Surgeon: Norleen JINNY Seltzer, MD;  Location: [facility];  Service: Urology;  Laterality:  Left;   I & D EXTREMITY Right 02/10/2022   Procedure: DEBRIDEMENT RIGHT LEG ABSCESS;  Surgeon: Harden Jerona GAILS, MD;  [facility] OR;  Service: Orthopedics;  Laterality: Right;   I & D EXTREMITY Right 03/12/2022   Procedure: RIGHT LEG IRRIGATION AND DEBRIDEMENT EXTREMITY;  Surgeon: Harden Jerona GAILS, MD;  Location: [facility] OR;  Service: Orthopedics;  Laterality: Right;   INSERTION OF MESH N/A 07/15/2014   Procedure: INSERTION OF MESH;  Surgeon: Elspeth KYM Schultze, MD;  Location: [facility] OR;  Service: General;  Laterality: N/A;   JOINT REPLACEMENT Right 2012   shoulder   LAPAROSCOPIC CHOLECYSTECTOMY W/ CHOLANGIOGRAPHY  2012   Dr Vernetta   NASAL SINUS SURGERY     OPEN REDUCTION INTERNAL FIXATION (ORIF) DISTAL RADIAL FRACTURE Left  03/20/2021   Procedure: OPEN REDUCTION INTERNAL FIXATION (ORIF) DISTAL RADIAL FRACTURE;  Surgeon: Josefina Chew, MD;  Location: [facility] OR;  Service: Orthopedics;  Laterality: Left;   RADIOLOGY WITH ANESTHESIA N/A 05/09/2014   Procedure: ADULT SEDATION WITH ANESTHESIA/MRI CERVICAL SPINE WITHOUT CONTRAST;  Surgeon: Medication Radiologist, MD;  Location: [facility] OR;  Service: Radiology;  Laterality: N/A;  DR. HAWKS/MRI   right knee arthroscopy     d/t meniscal tear   SHOULDER ARTHROSCOPY W/ ROTATOR CUFF REPAIR Bilateral three times each over several yrs   SKIN FULL THICKNESS GRAFT Right 03/12/2022   Procedure: SKIN GRAFT FULL THICKNESS;  Surgeon: Harden Jerona GAILS, MD;  Location: [facility] OR;  Service: Orthopedics;  Laterality: Right;   SKIN SPLIT GRAFT Right 02/17/2022   Procedure: RIGHT LEG SKIN GRAFT;  Surgeon: Harden Jerona GAILS, MD;  Location: [facility] OR;  Service: Orthopedics;  Laterality: Right;   THUMB ARTHROSCOPY Left    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   Procedure: RIGHT TOTAL KNEE ARTHROPLASTY;  Surgeon: Norleen Gavel, MD;  [facility] OR;  Service: Orthopedics;  Laterality: Right;   UMBILICAL HERNIA REPAIR N/A 07/15/2014   Procedure: LAPAROSCOPIC UMBILICAL AND INFRAUMBILICAL HERNIA;  Surgeon: Elspeth KYM Schultze, MD;  [facility] OR;  Service: General;  Laterality: N/A;    Family History  Problem Relation Age of Onset   Stroke Father     Social History:  reports that she quit smoking about 32 years ago. Her smoking use included cigarettes. She started smoking about 34 years ago. She has a 1 pack-year smoking history. She has never used smokeless tobacco. She reports that she does not drink alcohol  and does not use drugs.  Allergies:  Allergies  Allergen Reactions   Dilaudid  [Hydromorphone  Hcl] Shortness Of Breath   Gabapentin Other (See Comments)    Hoarseness , headache and sore throat   Latex Rash    Severe rash   Lyrica [Pregabalin] Other (See Comments)    No balance , had to walk with cane ,  Blurred vision,weakness.   Oxycodone  Shortness Of Breath and Other (See Comments)    Tolerated oxycodone  during admission to hospital 02/06/22   Singulair [Montelukast] Shortness Of Breath and Other (See Comments)    Vision issues, also   Ciprofloxacin Hcl Nausea And Vomiting and Other (See Comments)    Nausea and vomiting with by mouth form   Codeine Other (See Comments)    Hallucinations   Methadone Nausea And Vomiting and Other (See Comments)    Severe nausea and vomiting   Metronidazole  Nausea And Vomiting and Other (See Comments)    Gastric pain   Oysters [Shellfish Allergy] Other (See Comments)    Terrible gastric upset and cramping.   Clindamycin /Lincomycin  Diarrhea and Nausea Only   Donepezil Diarrhea and Other (See Comments)    Severe diarrhea   Ferrous Sulfate      Severe gi upset, will get an infusion when needed per caregiver   Sulfa Antibiotics Diarrhea and Other (See Comments)    GI issues    Tape Other (See Comments)    ADHESIVE TAPE-Severe rash   Zetia [Ezetimibe] Diarrhea   Elemental Sulfur Nausea And Vomiting   Iodine  Rash    Duplicate entry   Other Rash    All Antibiotic ointments/ creams   Oyster Shell Rash   Povidone Iodine  Rash and Other (See Comments)    Oyster shell products- Rash    Povidone-Iodine      Duplicate entry   Skintegrity Hydrogel [Skin Protectants, Misc.] Rash   Tapentadol Other (See Comments)    Nightmares **Nucynta**    Medications: I have reviewed the patient's current medications.  Results for orders placed or performed during the hospital encounter of 10/17/24 (from the past 48 hours)  C Difficile Quick Screen w PCR reflex     Status: Abnormal   Collection Time: 10/17/24  5:28 PM   Specimen: STOOL  Result Value Ref Range   C Diff antigen POSITIVE (A) NEGATIVE   C Diff toxin NEGATIVE NEGATIVE   C Diff interpretation Results are indeterminate. See PCR results.     Comment: Performed at [facility] Lab, [address], [place], [place] [address]  C. Diff by PCR, Reflexed     Status: None   Collection Time: 10/17/24  5:28 PM  Result Value Ref Range   Toxigenic C. Difficile by PCR NEGATIVE NEGATIVE    Comment: Patient is colonized with non toxigenic C. difficile. May not need treatment unless significant symptoms are present.   Hypervirulent Strain PRESUMPTIVE NEGATIVE PRESUMPTIVE NEGATIVE    Comment: Performed at [facility], [address] [place], [place] [address]  Procalcitonin     Status: None   Collection Time: 10/17/24 10:59 PM  Result Value Ref Range   Procalcitonin 17.40 ng/mL    Comment:        Interpretation: PCT >= 10 ng/mL: Important systemic inflammatory response, almost exclusively due to severe bacterial sepsis or septic shock. (NOTE)       Sepsis PCT Algorithm           Lower Respiratory Tract                                      Infection PCT Algorithm    ----------------------------     ----------------------------         PCT < 0.25 ng/mL                PCT < 0.10 ng/mL          Strongly encourage             Strongly discourage   discontinuation of antibiotics    initiation of antibiotics    ----------------------------     -----------------------------       PCT 0.25 - 0.50 ng/mL            PCT 0.10 - 0.25 ng/mL               OR       >80% decrease in PCT            Discourage initiation of  antibiotics      Encourage discontinuation           of antibiotics    ----------------------------     -----------------------------         PCT >= 0.50 ng/mL              PCT 0.26 - 0.50 ng/mL                AND       <80% decrease in PCT             Encourage initiation of                                             antibiotics       Encourage continuation           of antibiotics    ----------------------------     -----------------------------        PCT >= 0.50 ng/mL                  PCT > 0.50 ng/mL               AND          increase in PCT                  Strongly encourage                                      initiation of antibiotics    Strongly encourage escalation           of antibiotics                                     -----------------------------                                           PCT <= 0.25 ng/mL                                                 OR                                        > 80% decrease in PCT                                      Discontinue / Do not initiate                                             antibiotics  Performed at [facility], [address], [place], [place] [address]   CBC     Status: Abnormal   Collection Time:  10/17/24 10:59 PM  Result Value Ref Range   WBC 20.1 (H) 4.0 - 10.5 K/uL   RBC 4.21 3.87 - 5.11 MIL/uL   Hemoglobin 11.7 (L) 12.0 - 15.0 g/dL   HCT 62.7 63.9 - 53.9 %   MCV 88.4 80.0 - 100.0 fL   MCH 27.8 26.0 - 34.0 pg   MCHC 31.5 30.0 - 36.0 g/dL   RDW 85.7 88.4 - 84.4 %   Platelets 289 150 - 400 K/uL   nRBC 0.0 0.0 - 0.2 %    Comment: Performed at [facility], [address] [place], [place] [address]  Creatinine, serum     Status: None   Collection Time: 10/17/24 10:59 PM  Result Value Ref Range   Creatinine, Ser 0.93 0.44 - 1.00 mg/dL   GFR, Estimated >39 >39 mL/min    Comment: (NOTE) Calculated using the CKD-EPI Creatinine Equation (2021) Performed at [facility], [address] [place], [place] [address]   Brain natriuretic peptide     Status: Abnormal   Collection Time: 10/17/24 11:00 PM  Result Value Ref Range   B Natriuretic Peptide 2,245.5 (H) 0.0 - 100.0 pg/mL    Comment: Performed at [facility], [address], [place], [place] [address]  Troponin I (High Sensitivity)     Status: Abnormal   Collection Time: 10/17/24 11:00 PM  Result Value Ref Range   Troponin I (High Sensitivity) 4,245 (HH) <18 ng/L    Comment: CRITICAL RESULT CALLED TO, READ BACK BY AND VERIFIED WITH HARMON A, RN [id number]  10/17/2024 SANDOVAL K (NOTE) Elevated high sensitivity troponin I (hsTnI) values and significant  changes across serial measurements may suggest ACS but many other  chronic and acute conditions are known to elevate hsTnI results.  Refer to the Links section for chest pain algorithms and additional  guidance. Performed at [facility], [address], [place], [place] [address]   Troponin I (High Sensitivity)     Status: Abnormal   Collection Time: 10/18/24 12:53 AM  Result Value Ref Range   Troponin I (High Sensitivity) 4,193 (HH) <18 ng/L    Comment: CRITICAL VALUE NOTED. VALUE IS CONSISTENT WITH PREVIOUSLY REPORTED/CALLED VALUE (NOTE) Elevated high sensitivity troponin I (hsTnI) values and significant  changes across serial measurements may suggest ACS but many other  chronic and acute conditions are known to elevate hsTnI results.  Refer to the Links section for chest pain algorithms and additional  guidance. Performed at [facility], [address] [place], [place] [address]   MRSA Next Gen by PCR, Nasal     Status: None   Collection Time: 10/18/24  4:14 AM   Specimen: Nasal Mucosa; Nasal Swab  Result Value Ref Range   MRSA by PCR Next Gen NOT DETECTED NOT DETECTED    Comment: (NOTE) The GeneXpert MRSA Assay (FDA approved for NASAL specimens only), is one component of a comprehensive MRSA colonization surveillance program. It is not intended to diagnose MRSA infection nor to guide or monitor treatment for MRSA infections. Test performance is not FDA approved in patients less than 63 years old. Performed at [facility] Lab, [address] [place], [place] [address]   Procalcitonin     Status: None   Collection Time: 10/18/24  4:43 AM  Result Value Ref Range   Procalcitonin 21.50 ng/mL    Comment:        Interpretation: PCT >= 10 ng/mL: Important systemic inflammatory  response, almost exclusively due to severe bacterial sepsis or septic  shock. (NOTE)       Sepsis PCT Algorithm           Lower Respiratory Tract                                      Infection PCT Algorithm    ----------------------------     ----------------------------         PCT < 0.25 ng/mL                PCT < 0.10 ng/mL          Strongly encourage             Strongly discourage   discontinuation of antibiotics    initiation of antibiotics    ----------------------------     -----------------------------       PCT 0.25 - 0.50 ng/mL            PCT 0.10 - 0.25 ng/mL               OR       >80% decrease in PCT            Discourage initiation of                                            antibiotics      Encourage discontinuation           of antibiotics    ----------------------------     -----------------------------         PCT >= 0.50 ng/mL              PCT 0.26 - 0.50 ng/mL                AND       <80% decrease in PCT             Encourage initiation of                                             antibiotics       Encourage continuation           of antibiotics    ----------------------------     -----------------------------        PCT >= 0.50 ng/mL                  PCT > 0.50 ng/mL               AND         increase in PCT                  Strongly encourage                                      initiation of antibiotics    Strongly encourage escalation           of antibiotics                                     -----------------------------  PCT <= 0.25 ng/mL                                                 OR                                        > 80% decrease in PCT                                      Discontinue / Do not initiate                                             antibiotics  Performed at [facility], [address], [place], [place] [address]   Phosphorus     Status: None   Collection Time: 10/18/24  4:43 AM  Result Value Ref Range   Phosphorus 3.1 2.5 - 4.6 mg/dL     Comment: Performed at [facility], [address], [place], [place] [address]  Magnesium      Status: Abnormal   Collection Time: 10/18/24  4:43 AM  Result Value Ref Range   Magnesium  1.6 (L) 1.7 - 2.4 mg/dL    Comment: Performed at [facility], [address], [place], [place] [address]  C-reactive protein     Status: Abnormal   Collection Time: 10/18/24  4:43 AM  Result Value Ref Range   CRP 17.5 (H) <1.0 mg/dL    Comment: Performed at [facility], [address] [place], [place] [address]  CBC with Differential/Platelet     Status: Abnormal   Collection Time: 10/18/24  4:43 AM  Result Value Ref Range   WBC 16.0 (H) 4.0 - 10.5 K/uL   RBC 3.80 (L) 3.87 - 5.11 MIL/uL   Hemoglobin 10.7 (L) 12.0 - 15.0 g/dL   HCT 66.6 (L) 63.9 - 53.9 %   MCV 87.6 80.0 - 100.0 fL   MCH 28.2 26.0 - 34.0 pg   MCHC 32.1 30.0 - 36.0 g/dL   RDW 85.7 88.4 - 84.4 %   Platelets 225 150 - 400 K/uL   nRBC 0.0 0.0 - 0.2 %   Neutrophils Relative % 95 %   Neutro Abs 15.1 (H) 1.7 - 7.7 K/uL   Lymphocytes Relative 2 %   Lymphs Abs 0.4 (L) 0.7 - 4.0 K/uL   Monocytes Relative 2 %   Monocytes Absolute 0.3 0.1 - 1.0 K/uL   Eosinophils Relative 0 %   Eosinophils Absolute 0.0 0.0 - 0.5 K/uL   Basophils Relative 0 %   Basophils Absolute 0.0 0.0 - 0.1 K/uL   Immature Granulocytes 1 %   Abs Immature Granulocytes 0.21 (H) 0.00 - 0.07 K/uL    Comment: Performed at [facility], [address] [place], [place] [address]  Comprehensive metabolic panel with GFR     Status: Abnormal   Collection Time: 10/18/24  4:43 AM  Result Value Ref Range   Sodium 140 135 - 145 mmol/L   Potassium 3.6 3.5 - 5.1 mmol/L  Chloride 105 98 - 111 mmol/L   CO2 25 22 - 32 mmol/L   Glucose, Bld 137 (H) 70 - 99 mg/dL    Comment: Glucose reference range applies only to samples taken after fasting for at least 8 hours.   BUN 16 8 - 23 mg/dL   Creatinine, Ser 9.03 0.44 - 1.00 mg/dL   Calcium  8.1 (L) 8.9 - 10.3  mg/dL   Total Protein 4.8 (L) 6.5 - 8.1 g/dL   Albumin  2.4 (L) 3.5 - 5.0 g/dL   AST 99 (H) 15 - 41 U/L   ALT 82 (H) 0 - 44 U/L   Alkaline Phosphatase 98 38 - 126 U/L   Total Bilirubin 1.0 0.0 - 1.2 mg/dL   GFR, Estimated 58 (L) >60 mL/min    Comment: (NOTE) Calculated using the CKD-EPI Creatinine Equation (2021)    Anion gap 10 5 - 15    Comment: Performed at [facility], [address], [place], [place] [address]  CK     Status: Abnormal   Collection Time: 10/18/24  4:43 AM  Result Value Ref Range   Total CK 1,064 (H) 38 - 234 U/L    Comment: Performed at [facility], [address], [place], [place] [address]  Lactic acid, plasma     Status: None   Collection Time: 10/18/24  4:43 AM  Result Value Ref Range   Lactic Acid, Venous 1.5 0.5 - 1.9 mmol/L    Comment: Performed at [facility], [address] [place], [place] [address]  Heparin  level (unfractionated)     Status: Abnormal   Collection Time: 10/18/24 12:30 PM  Result Value Ref Range   Heparin  Unfractionated <0.10 (L) 0.30 - 0.70 IU/mL    Comment: (NOTE) The clinical reportable range upper limit is being lowered to >1.10 to align with the FDA approved guidance for the current laboratory assay.  If heparin  results are below expected values, and patient dosage has  been confirmed, suggest follow up testing of antithrombin III levels. Performed at [facility], [address] [place], [place] [address]   Heparin  level (unfractionated)     Status: Abnormal   Collection Time: 10/18/24  9:21 PM  Result Value Ref Range   Heparin  Unfractionated 0.13 (L) 0.30 - 0.70 IU/mL    Comment: (NOTE) The clinical reportable range upper limit is being lowered to >1.10 to align with the FDA approved guidance for the current laboratory assay.  If heparin  results are below expected values, and patient dosage has  been confirmed, suggest follow up testing of antithrombin III levels. Performed at [facility] [place] [facility], [address] [place], [place] [address]   Phosphorus     Status: None   Collection Time: 10/19/24  7:02 AM  Result Value Ref Range   Phosphorus 2.9 2.5 - 4.6 mg/dL    Comment: Performed at [facility], [address] [place], [place] [address]  Magnesium      Status: Abnormal   Collection Time: 10/19/24  7:02 AM  Result Value Ref Range   Magnesium  2.5 (H) 1.7 - 2.4 mg/dL    Comment: Performed at [facility], [address] [place], [place] [address]  C-reactive protein     Status: Abnormal   Collection Time: 10/19/24  7:02 AM  Result Value Ref Range   CRP 18.5 (H) <1.0 mg/dL    Comment: Performed at [facility], [address] [place], [place] [address]  CBC with Differential/Platelet     Status: Abnormal   Collection Time: 10/19/24  7:02 AM  Result Value Ref Range   WBC 22.2 (H) 4.0 - 10.5 K/uL   RBC 3.81 (L) 3.87 - 5.11 MIL/uL   Hemoglobin 10.8 (L) 12.0 - 15.0 g/dL   HCT 66.6 (L) 63.9 - 53.9 %   MCV 87.4 80.0 - 100.0 fL   MCH 28.3 26.0 - 34.0 pg   MCHC 32.4 30.0 - 36.0 g/dL   RDW 85.7 88.4 - 84.4 %   Platelets 229 150 - 400 K/uL   nRBC 0.0 0.0 - 0.2 %   Neutrophils Relative % 92 %   Neutro Abs 20.5 (H) 1.7 - 7.7 K/uL   Lymphocytes Relative 3 %   Lymphs Abs 0.6 (L) 0.7 - 4.0 K/uL   Monocytes Relative 4 %   Monocytes Absolute 0.9 0.1 - 1.0 K/uL   Eosinophils Relative 0 %   Eosinophils Absolute 0.0 0.0 - 0.5 K/uL   Basophils Relative 0 %   Basophils Absolute 0.0 0.0 - 0.1 K/uL   Immature Granulocytes 1 %   Abs Immature Granulocytes 0.18 (H) 0.00 - 0.07 K/uL    Comment: Performed at [facility], [address] [place], [place] [address]  Comprehensive metabolic panel with GFR     Status: Abnormal   Collection Time: 10/19/24  7:02 AM  Result Value Ref Range   Sodium 138 135 - 145 mmol/L   Potassium 3.9 3.5 - 5.1 mmol/L   Chloride 104 98 - 111 mmol/L   CO2 18 (L) 22 - 32 mmol/L   Glucose, Bld 133 (H) 70 - 99 mg/dL    Comment:  Glucose reference range applies only to samples taken after fasting for at least 8 hours.   BUN 27 (H) 8 - 23 mg/dL   Creatinine, Ser 9.05 0.44 - 1.00 mg/dL   Calcium  8.0 (L) 8.9 - 10.3 mg/dL   Total Protein 5.1 (L) 6.5 - 8.1 g/dL   Albumin  2.4 (L) 3.5 - 5.0 g/dL   AST 80 (H) 15 - 41 U/L   ALT 68 (H) 0 - 44 U/L   Alkaline Phosphatase 98 38 - 126 U/L   Total Bilirubin 0.6 0.0 - 1.2 mg/dL   GFR, Estimated 59 (L) >60 mL/min    Comment: (NOTE) Calculated using the CKD-EPI Creatinine Equation (2021)    Anion gap 16 (H) 5 - 15    Comment: Performed at [facility], [address] [place], [place] [address]  Heparin  level (unfractionated)     Status: None   Collection Time: 10/19/24  7:02 AM  Result Value Ref Range   Heparin  Unfractionated 0.34 0.30 - 0.70 IU/mL    Comment: (NOTE) The clinical reportable range upper limit is being lowered to >1.10 to align with the FDA approved guidance for the current laboratory assay.  If heparin  results are below expected values, and patient dosage has  been confirmed, suggest follow up testing of antithrombin III levels. Performed at [facility], [address] [place], [place] [address]   CK     Status: Abnormal   Collection Time: 10/19/24  7:02 AM  Result Value Ref Range   Total CK 365 (H) 38 - 234 U/L    Comment: Performed at [facility], [address], [place], [place] [address]  Heparin  level (unfractionated)     Status: None   Collection Time: 10/19/24  2:52 PM  Result Value Ref Range   Heparin  Unfractionated 0.30 0.30 - 0.70 IU/mL    Comment: (NOTE) The clinical reportable range upper limit is being lowered to >1.10 to align with the FDA approved guidance for the current laboratory assay.  If heparin  results are below expected values, and patient dosage has  been confirmed, suggest follow up testing of antithrombin III levels. Performed at [facility], [address] [place], [place] [address]    Troponin I (High Sensitivity)     Status: Abnormal   Collection Time: 10/19/24  2:52 PM  Result Value Ref Range   Troponin I (High Sensitivity) 2,107 (HH) <18 ng/L    Comment: CRITICAL VALUE NOTED. VALUE IS CONSISTENT WITH PREVIOUSLY REPORTED/CALLED VALUE (NOTE) Elevated high sensitivity troponin I (hsTnI) values and significant  changes across serial measurements may suggest ACS but many other  chronic and acute conditions are known to elevate hsTnI results.  Refer to the Links section for chest pain algorithms and additional  guidance. Performed at [facility], [address], [place], [place] [address]     VAS US  ABI WITH/WO TBI Result Date: 10/19/2024  LOWER EXTREMITY DOPPLER STUDY Patient Name:  Isabel Barnes  Date of Exam:   10/19/2024 Medical Rec #: [id number]          Accession #:    [id number] Date of Birth: 1941/06/02           Patient Gender: F Patient Age:   40 years Exam Location:  [facility] Procedure:      VAS US  ABI WITH/WO TBI Referring Phys: LAVADA [facility] --------------------------------------------------------------------------------  Indications: Ulceration (Chronic) High Risk Factors: Hypertension, hyperlipidemia, no history of smoking.  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 Limitations: Today's exam was limited due to Bandages, skin texture, edema,              venous interference. Comparison Study: Prior ABI done 09/09/23 Performing Technologist: Rachel Pellet RVS  Examination Guidelines: A complete evaluation includes at minimum, Doppler waveform signals and systolic blood pressure reading at the level of bilateral brachial, anterior tibial, and posterior tibial arteries, when vessel segments are accessible. Bilateral testing is considered an integral part of a complete examination. Photoelectric Plethysmograph (PPG) waveforms and toe systolic pressure readings are included as required and additional duplex  testing as needed. Limited examinations for reoccurring indications may be performed as noted.  ABI Findings: +---------+------------------+-----+---------+--------+ Right    Rt Pressure (mmHg)IndexWaveform Comment  +---------+------------------+-----+---------+--------+ Brachial 129                    triphasic         +---------+------------------+-----+---------+--------+ PTA      147               1.14 biphasic          +---------+------------------+-----+---------+--------+ DP       254               1.97 biphasic          +---------+------------------+-----+---------+--------+ Great Toe61                0.47 Normal            +---------+------------------+-----+---------+--------+ +---------+------------------+-----+-----------+-------+ Left     Lt Pressure (mmHg)IndexWaveform   Comment +---------+------------------+-----+-----------+-------+ Brachial 117                    triphasic          +---------+------------------+-----+-----------+-------+  PTA      254               1.97 multiphasic        +---------+------------------+-----+-----------+-------+ DP       185               1.43 multiphasic        +---------+------------------+-----+-----------+-------+ Great Toe70                0.54 Normal             +---------+------------------+-----+-----------+-------+ +-------+-----------+-----------+------------+------------+ ABI/TBIToday's ABIToday's TBIPrevious ABIPrevious TBI +-------+-----------+-----------+------------+------------+ Right  1.97       0.47       1.37        0.25         +-------+-----------+-----------+------------+------------+ Left   1.97       0.54       1.49        0.84         +-------+-----------+-----------+------------+------------+  Arterial wall calcification precludes accurate ankle pressures and ABIs. Bilateral ABIs appear essentially unchanged compared to prior study on 09/09/23. Right TBIs appear  increased. Left TBIs appear decreased.  Summary: Right: Resting right ankle-brachial index indicates noncompressible right lower extremity arteries. Right toe pressure is >60 mmHg which suggests adequate perfusion for healing. Left: Resting left ankle-brachial index indicates noncompressible left lower extremity arteries. Left toe pressure is >60 mmHg which suggests adequate perfusion for healing. *See table(s) above for measurements and observations.     Preliminary    ECHOCARDIOGRAM COMPLETE Result Date: 10/18/2024    ECHOCARDIOGRAM REPORT   Patient Name:   Isabel Barnes Date of Exam: 10/18/2024 Medical Rec #:  [id number]         Height:       62.0 in Accession #:    [id number]        Weight:       102.0 lb Date of Birth:  1941/11/15          BSA:          1.436 m Patient Age:    83 years          BP:           154/70 mmHg Patient Gender: F                 HR:           86 bpm. Exam Location:  Inpatient Procedure: 2D Echo, Cardiac Doppler, Color Doppler and PEDOF (Both Spectral and            Color Flow Doppler were utilized during procedure). Indications:    NSTEMI I21.4  History:        Patient has prior history of Echocardiogram examinations, most                 recent 02/08/2022. NSTEMI; Risk Factors:Hypertension and                 Dyslipidemia.  Sonographer:    Koleen Popper RDCS Referring Phys: CURTISTINE LITTIE FARR  Sonographer Comments: Image acquisition challenging due to patient body habitus. IMPRESSIONS  1. Left ventricular ejection fraction, by estimation, is 65 to 70%. The left ventricle has normal function. The left ventricle has no regional wall motion abnormalities. 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 Left ventricular diastolic parameters are indeterminate. Elevated left atrial pressure.  2. Right ventricular systolic function is normal. The right ventricular size is  normal. There is severely elevated pulmonary artery systolic pressure. The  estimated right ventricular systolic pressure is 61.8 mmHg.  3. Left atrial size was severely dilated.  4. Right atrial size was severely dilated.  5. The mitral valve is degenerative. Moderate mitral valve regurgitation. No evidence of mitral stenosis. Severe mitral annular calcification.  6. Tricuspid valve regurgitation is moderate to severe.  7. The aortic valve is abnormal. There is severe calcifcation of the aortic valve. Aortic valve regurgitation is not visualized. Moderate aortic valve stenosis. Aortic valve area, by VTI measures 1.50 cm. Aortic valve mean gradient measures 28.0 mmHg. Aortic valve Vmax measures 3.47 m/s.  8. The inferior vena cava is dilated in size with <50% respiratory variability, suggesting right atrial pressure of 15 mmHg. FINDINGS  Left Ventricle: Left ventricular ejection fraction, by estimation, is 65 to 70%. The left ventricle has normal function. The left ventricle has no regional wall motion abnormalities. The left ventricular internal cavity size was normal in size. There is  moderate asymmetric left ventricular hypertrophy of the basal-septal segment. Left ventricular diastolic parameters are indeterminate. Elevated left atrial pressure. Right Ventricle: The right ventricular size is normal. No increase in right ventricular wall thickness. Right ventricular systolic function is normal. There is severely elevated pulmonary artery systolic pressure. The tricuspid regurgitant velocity is 3.42 m/s, and with an assumed right atrial pressure of 15 mmHg, the estimated right ventricular systolic pressure is 61.8 mmHg. Left Atrium: Left atrial size was severely dilated. Right Atrium: Right atrial size was severely dilated. Pericardium: There is no evidence of pericardial effusion. Mitral Valve: The mitral valve is degenerative in appearance. Severe mitral annular calcification. Moderate mitral valve regurgitation. No evidence of mitral valve stenosis. MV peak gradient, 12.7 mmHg. The  mean mitral valve gradient is 4.0 mmHg with average heart rate of 82 bpm. Tricuspid Valve: The tricuspid valve is normal in structure. Tricuspid valve regurgitation is moderate to severe. No evidence of tricuspid stenosis. Aortic Valve: The aortic valve is abnormal. There is severe calcifcation of the aortic valve. Aortic valve regurgitation is not visualized. Moderate aortic stenosis is present. Aortic valve mean gradient measures 28.0 mmHg. Aortic valve peak gradient measures 48.2 mmHg. Aortic valve area, by VTI measures 1.50 cm. Pulmonic Valve: The pulmonic valve was not well visualized. Pulmonic valve regurgitation is trivial. No evidence of pulmonic stenosis. Aorta: The aortic root is normal in size and structure. Venous: The inferior vena cava is dilated in size with less than 50% respiratory variability, suggesting right atrial pressure of 15 mmHg. IAS/Shunts: No atrial level shunt detected by color flow Doppler.  LEFT VENTRICLE PLAX 2D LVIDd:         4.30 cm   Diastology LVIDs:         2.50 cm   LV e' medial:    7.18 cm/s LV PW:         1.10 cm   LV E/e' medial:  21.0 LV IVS:        1.50 cm   LV e' lateral:   8.67 cm/s LVOT diam:     1.90 cm   LV E/e' lateral: 17.4 LV SV:         107 LV SV Index:   75 LVOT Area:     2.84 cm  RIGHT VENTRICLE             IVC RV Basal diam:  4.20 cm     IVC diam: 2.80 cm RV Mid diam:    2.60  cm RV S prime:     10.50 cm/s TAPSE (M-mode): 2.0 cm LEFT ATRIUM             Index        RIGHT ATRIUM           Index LA diam:        4.50 cm 3.13 cm/m   RA Area:     21.60 cm LA Vol (A2C):   60.1 ml 41.85 ml/m  RA Volume:   63.00 ml  43.87 ml/m LA Vol (A4C):   67.0 ml 46.66 ml/m LA Biplane Vol: 63.2 ml 44.01 ml/m  AORTIC VALVE AV Area (Vmax):    1.51 cm AV Area (Vmean):   1.47 cm AV Area (VTI):     1.50 cm AV Vmax:           347.00 cm/s AV Vmean:          239.000 cm/s AV VTI:            0.713 m AV Peak Grad:      48.2 mmHg AV Mean Grad:      28.0 mmHg LVOT Vmax:         185.00  cm/s LVOT Vmean:        124.000 cm/s LVOT VTI:          0.378 m LVOT/AV VTI ratio: 0.53  AORTA Ao Root diam: 2.75 cm Ao Asc diam:  2.90 cm MITRAL VALVE                TRICUSPID VALVE MV Area (PHT): 3.95 cm     TR Peak grad:   46.8 mmHg MV Area VTI:   3.27 cm     TR Vmax:        342.00 cm/s MV Peak grad:  12.7 mmHg MV Mean grad:  4.0 mmHg     SHUNTS MV Vmax:       1.78 m/s     Systemic VTI:  0.38 m MV Vmean:      97.4 cm/s    Systemic Diam: 1.90 cm MV Decel Time: 192 msec MV E velocity: 150.50 cm/s MV A velocity: 81.50 cm/s MV E/A ratio:  1.85 Soyla Merck MD Electronically signed by Soyla Merck MD Signature Date/Time: 10/18/2024/11:29:23 AM    Final    DG Chest Port 1 View Result Date: 10/17/2024 EXAM: 1 VIEW(S) XRAY OF THE CHEST 10/17/2024 10:34:00 PM COMPARISON: 10/17/2024 CLINICAL HISTORY: SOB (shortness of breath) FINDINGS: LUNGS AND PLEURA: Increased bilateral perihilar interstitial and airspace opacities. No pleural effusion. No pneumothorax. Limited evaluation due to rotation. HEART AND MEDIASTINUM: Cardiomegaly. BONES AND SOFT TISSUES: Right shoulder arthroplasty. ACDF noted. No acute osseous abnormality. IMPRESSION: 1. Increased bilateral perihilar interstitial and airspace opacities. Limited evaluation due to rotation. 2. Cardiomegaly. Electronically signed by: Morgane Naveau MD 10/17/2024 10:58 PM EST RP Workstation: [id number]    ROS Blood pressure (!) 111/94, pulse 77, temperature 97.6 F (36.4 C), temperature source Oral, resp. rate 20, height 5' 2 (1.575 m), weight 67 kg, SpO2 97%. Physical Exam The right lower extremity dressings were removed today.  She has a wound approximately 15 x 8 cm on the medial aspect of the right calf.  Please see the pictures in the chart.  The wound is clean with evidence of granulation tissue within the base of the wound.  Per the patient's report the wound has been slowly decreasing in size with some minimal evidence of reepithelialization around  the edges.  There  is no significant drainage from the wound as evidenced by the dressings which have normal-appearing tissue fluid.  The erythema around the wound is markedly decreased based on the photographs that were placed in the chart when she was admitted. Assessment/Plan: Chronic right lower extremity wound: I do not believe that this wound is the source of her cellulitis.  The wound is clean and granulating without significant drainage.  I would recommend changing her  wound care regimen to a daily dressing change using Vashe on the wound.  Agree with obtaining a CT scan of the right lower extremity to evaluate for a deeper abscess.  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  Will continue to follow along with you.   Leonce KATHEE [place] 10/19/2024, 5:07 PM

## 2024-10-19 NOTE — Progress Notes (Signed)
 Progress Note  Patient Name: Isabel Barnes Date of Encounter: 10/19/2024  Primary Cardiologist: Deatrice Cage, MD   Subjective   Feeling well today. Able to ambulate in the hall without any chest pain. Has a little shortness of breath.   Inpatient Medications    Scheduled Meds:  amoxicillin -clavulanate  1 tablet Oral BID   aspirin  EC  81 mg Oral Daily   cycloSPORINE   1 drop Both Eyes Daily   diphenhydrAMINE   50 mg Oral Once   Or   diphenhydrAMINE   50 mg Intravenous Once   HYDROcodone -acetaminophen   1 tablet Oral Q6H   linezolid   600 mg Oral Q12H   [START ON 10/20/2024] methylPREDNISolone  (SOLU-MEDROL ) injection  60 mg Intravenous Q24H   Continuous Infusions:  heparin  850 Units/hr (10/19/24 0648)   PRN Meds: acetaminophen  **OR** acetaminophen , hydrOXYzine , methocarbamol , naphazoline-glycerin , nitroGLYCERIN , ondansetron  **OR** ondansetron  (ZOFRAN ) IV, triamcinolone    Vital Signs    Vitals:   10/19/24 0400 10/19/24 0500 10/19/24 0800 10/19/24 1152  BP: (!) 115/59  139/63 (!) 111/94  Pulse: 61  64 77  Resp: 15  19 20   Temp: 98 F (36.7 C)   97.6 F (36.4 C)  TempSrc: Oral   Oral  SpO2: 97%  96% 97%  Weight:  67 kg    Height:        Intake/Output Summary (Last 24 hours) at 10/19/2024 1423 Last data filed at 10/19/2024 1018 Gross per 24 hour  Intake 592.68 ml  Output 150 ml  Net 442.68 ml   Filed Weights   10/17/24 1129 10/19/24 0500  Weight: 46.3 kg 67 kg    Telemetry    Sinus rhythm  - Personally Reviewed  ECG    Normal sinus rhythm, prolonged QT - Personally Reviewed  Physical Exam   Physical Exam Vitals and nursing note reviewed.  Constitutional:      General: She is not in acute distress.    Appearance: She is not ill-appearing.  HENT:     Head: Normocephalic.  Cardiovascular:     Rate and Rhythm: Normal rate and regular rhythm.     Heart sounds: Murmur heard.  Pulmonary:     Effort: Pulmonary effort is normal.     Breath sounds:  No rales.  Musculoskeletal:     Comments: Chronic lower extremity edematous changes  Skin:    General: Skin is warm.  Neurological:     Mental Status: She is alert. Mental status is at baseline.      Labs    Chemistry Recent Labs  Lab 10/17/24 1204 10/17/24 2259 10/18/24 0443 10/19/24 0702  NA 137  --  140 138  K 3.9  --  3.6 3.9  CL 101  --  105 104  CO2 22  --  25 18*  GLUCOSE 129*  --  137* 133*  BUN 15  --  16 27*  CREATININE 0.96 0.93 0.96 0.94  CALCIUM  8.7*  --  8.1* 8.0*  PROT 6.3*  --  4.8* 5.1*  ALBUMIN  3.4*  --  2.4* 2.4*  AST 92*  --  99* 80*  ALT 113*  --  82* 68*  ALKPHOS 138*  --  98 98  BILITOT 1.1  --  1.0 0.6  GFRNONAA 58* >60 58* 59*  ANIONGAP 14  --  10 16*     Hematology Recent Labs  Lab 10/17/24 2259 10/18/24 0443 10/19/24 0702  WBC 20.1* 16.0* 22.2*  RBC 4.21 3.80* 3.81*  HGB 11.7* 10.7* 10.8*  HCT 37.2 33.3* 33.3*  MCV 88.4 87.6 87.4  MCH 27.8 28.2 28.3  MCHC 31.5 32.1 32.4  RDW 14.2 14.2 14.2  PLT 289 225 229    Cardiac EnzymesNo results for input(s): TROPONINI in the last 168 hours. No results for input(s): TROPIPOC in the last 168 hours.   BNP Recent Labs  Lab 10/17/24 2300  BNP 2,245.5*     DDimer No results for input(s): DDIMER in the last 168 hours.   Radiology    VAS US  ABI WITH/WO TBI Result Date: 10/19/2024  LOWER EXTREMITY DOPPLER STUDY Patient Name:  Isabel Barnes  Date of Exam:   10/19/2024 Medical Rec #: [id number]          Accession #:    [id number] Date of Birth: Mar 24, 1941           Patient Gender: F Patient Age:   83 years Exam Location:  [facility] Procedure:      VAS US  ABI WITH/WO TBI Referring Phys: LAVADA [facility] --------------------------------------------------------------------------------  Indications: Ulceration (Chronic) High Risk Factors: Hypertension, hyperlipidemia, no history of smoking.  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  Limitations: Today's exam was limited due to Bandages, skin texture, edema,              venous interference. Comparison Study: Prior ABI done 09/09/23 Performing Technologist: Rachel Pellet RVS  Examination Guidelines: A complete evaluation includes at minimum, Doppler waveform signals and systolic blood pressure reading at the level of bilateral brachial, anterior tibial, and posterior tibial arteries, when vessel segments are accessible. Bilateral testing is considered an integral part of a complete examination. Photoelectric Plethysmograph (PPG) waveforms and toe systolic pressure readings are included as required and additional duplex testing as needed. Limited examinations for reoccurring indications may be performed as noted.  ABI Findings: +---------+------------------+-----+---------+--------+ Right    Rt Pressure (mmHg)IndexWaveform Comment  +---------+------------------+-----+---------+--------+ Brachial 129                    triphasic         +---------+------------------+-----+---------+--------+ PTA      147               1.14 biphasic          +---------+------------------+-----+---------+--------+ DP       254               1.97 biphasic          +---------+------------------+-----+---------+--------+ Great Toe61                0.47 Normal            +---------+------------------+-----+---------+--------+ +---------+------------------+-----+-----------+-------+ Left     Lt Pressure (mmHg)IndexWaveform   Comment +---------+------------------+-----+-----------+-------+ Brachial 117                    triphasic          +---------+------------------+-----+-----------+-------+ PTA      254               1.97 multiphasic        +---------+------------------+-----+-----------+-------+ DP       185               1.43 multiphasic        +---------+------------------+-----+-----------+-------+ Great Toe70                0.54 Normal              +---------+------------------+-----+-----------+-------+ +-------+-----------+-----------+------------+------------+ ABI/TBIToday's ABIToday's TBIPrevious ABIPrevious  TBI +-------+-----------+-----------+------------+------------+ Right  1.97       0.47       1.37        0.25         +-------+-----------+-----------+------------+------------+ Left   1.97       0.54       1.49        0.84         +-------+-----------+-----------+------------+------------+  Arterial wall calcification precludes accurate ankle pressures and ABIs. Bilateral ABIs appear essentially unchanged compared to prior study on 09/09/23. Right TBIs appear increased. Left TBIs appear decreased.  Summary: Right: Resting right ankle-brachial index indicates noncompressible right lower extremity arteries. Right toe pressure is >60 mmHg which suggests adequate perfusion for healing. Left: Resting left ankle-brachial index indicates noncompressible left lower extremity arteries. Left toe pressure is >60 mmHg which suggests adequate perfusion for healing. *See table(s) above for measurements and observations.     Preliminary    ECHOCARDIOGRAM COMPLETE Result Date: 10/18/2024    ECHOCARDIOGRAM REPORT   Patient Name:   Isabel Barnes Date of Exam: 10/18/2024 Medical Rec #:  [id number]         Height:       62.0 in Accession #:    [id number]        Weight:       102.0 lb Date of Birth:  05/16/1941          BSA:          1.436 m Patient Age:    83 years          BP:           154/70 mmHg Patient Gender: F                 HR:           86 bpm. Exam Location:  Inpatient Procedure: 2D Echo, Cardiac Doppler, Color Doppler and PEDOF (Both Spectral and            Color Flow Doppler were utilized during procedure). Indications:    NSTEMI I21.4  History:        Patient has prior history of Echocardiogram examinations, most                 recent 02/08/2022. NSTEMI; Risk Factors:Hypertension and                 Dyslipidemia.  Sonographer:     Koleen Popper RDCS Referring Phys: CURTISTINE LITTIE FARR  Sonographer Comments: Image acquisition challenging due to patient body habitus. IMPRESSIONS  1. Left ventricular ejection fraction, by estimation, is 65 to 70%. The left ventricle has normal function. The left ventricle has no regional wall motion abnormalities. 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 Left ventricular diastolic parameters are indeterminate. Elevated left atrial pressure.  2. Right ventricular systolic function is normal. The right ventricular size is normal. There is severely elevated pulmonary artery systolic pressure. The estimated right ventricular systolic pressure is 61.8 mmHg.  3. Left atrial size was severely dilated.  4. Right atrial size was severely dilated.  5. The mitral valve is degenerative. Moderate mitral valve regurgitation. No evidence of mitral stenosis. Severe mitral annular calcification.  6. Tricuspid valve regurgitation is moderate to severe.  7. The aortic valve is abnormal. There is severe calcifcation of the aortic valve. Aortic valve regurgitation is not visualized. Moderate aortic valve stenosis. Aortic valve area, by VTI measures 1.50 cm. Aortic valve mean gradient  measures 28.0 mmHg. Aortic valve Vmax measures 3.47 m/s.  8. The inferior vena cava is dilated in size with <50% respiratory variability, suggesting right atrial pressure of 15 mmHg. FINDINGS  Left Ventricle: Left ventricular ejection fraction, by estimation, is 65 to 70%. The left ventricle has normal function. The left ventricle has no regional wall motion abnormalities. The left ventricular internal cavity size was normal in size. There is  moderate asymmetric left ventricular hypertrophy of the basal-septal segment. Left ventricular diastolic parameters are indeterminate. Elevated left atrial pressure. Right Ventricle: The right ventricular size is normal. No increase in right ventricular wall thickness.  Right ventricular systolic function is normal. There is severely elevated pulmonary artery systolic pressure. The tricuspid regurgitant velocity is 3.42 m/s, and with an assumed right atrial pressure of 15 mmHg, the estimated right ventricular systolic pressure is 61.8 mmHg. Left Atrium: Left atrial size was severely dilated. Right Atrium: Right atrial size was severely dilated. Pericardium: There is no evidence of pericardial effusion. Mitral Valve: The mitral valve is degenerative in appearance. Severe mitral annular calcification. Moderate mitral valve regurgitation. No evidence of mitral valve stenosis. MV peak gradient, 12.7 mmHg. The mean mitral valve gradient is 4.0 mmHg with average heart rate of 82 bpm. Tricuspid Valve: The tricuspid valve is normal in structure. Tricuspid valve regurgitation is moderate to severe. No evidence of tricuspid stenosis. Aortic Valve: The aortic valve is abnormal. There is severe calcifcation of the aortic valve. Aortic valve regurgitation is not visualized. Moderate aortic stenosis is present. Aortic valve mean gradient measures 28.0 mmHg. Aortic valve peak gradient measures 48.2 mmHg. Aortic valve area, by VTI measures 1.50 cm. Pulmonic Valve: The pulmonic valve was not well visualized. Pulmonic valve regurgitation is trivial. No evidence of pulmonic stenosis. Aorta: The aortic root is normal in size and structure. Venous: The inferior vena cava is dilated in size with less than 50% respiratory variability, suggesting right atrial pressure of 15 mmHg. IAS/Shunts: No atrial level shunt detected by color flow Doppler.  LEFT VENTRICLE PLAX 2D LVIDd:         4.30 cm   Diastology LVIDs:         2.50 cm   LV e' medial:    7.18 cm/s LV PW:         1.10 cm   LV E/e' medial:  21.0 LV IVS:        1.50 cm   LV e' lateral:   8.67 cm/s LVOT diam:     1.90 cm   LV E/e' lateral: 17.4 LV SV:         107 LV SV Index:   75 LVOT Area:     2.84 cm  RIGHT VENTRICLE             IVC RV Basal diam:   4.20 cm     IVC diam: 2.80 cm RV Mid diam:    2.60 cm RV S prime:     10.50 cm/s TAPSE (M-mode): 2.0 cm LEFT ATRIUM             Index        RIGHT ATRIUM           Index LA diam:        4.50 cm 3.13 cm/m   RA Area:     21.60 cm LA Vol (A2C):   60.1 ml 41.85 ml/m  RA Volume:   63.00 ml  43.87 ml/m LA Vol (A4C):   67.0 ml 46.66 ml/m  LA Biplane Vol: 63.2 ml 44.01 ml/m  AORTIC VALVE AV Area (Vmax):    1.51 cm AV Area (Vmean):   1.47 cm AV Area (VTI):     1.50 cm AV Vmax:           347.00 cm/s AV Vmean:          239.000 cm/s AV VTI:            0.713 m AV Peak Grad:      48.2 mmHg AV Mean Grad:      28.0 mmHg LVOT Vmax:         185.00 cm/s LVOT Vmean:        124.000 cm/s LVOT VTI:          0.378 m LVOT/AV VTI ratio: 0.53  AORTA Ao Root diam: 2.75 cm Ao Asc diam:  2.90 cm MITRAL VALVE                TRICUSPID VALVE MV Area (PHT): 3.95 cm     TR Peak grad:   46.8 mmHg MV Area VTI:   3.27 cm     TR Vmax:        342.00 cm/s MV Peak grad:  12.7 mmHg MV Mean grad:  4.0 mmHg     SHUNTS MV Vmax:       1.78 m/s     Systemic VTI:  0.38 m MV Vmean:      97.4 cm/s    Systemic Diam: 1.90 cm MV Decel Time: 192 msec MV E velocity: 150.50 cm/s MV A velocity: 81.50 cm/s MV E/A ratio:  1.85 Soyla Merck MD Electronically signed by Soyla Merck MD Signature Date/Time: 10/18/2024/11:29:23 AM    Final    DG Chest Port 1 View Result Date: 10/17/2024 EXAM: 1 VIEW(S) XRAY OF THE CHEST 10/17/2024 10:34:00 PM COMPARISON: 10/17/2024 CLINICAL HISTORY: SOB (shortness of breath) FINDINGS: LUNGS AND PLEURA: Increased bilateral perihilar interstitial and airspace opacities. No pleural effusion. No pneumothorax. Limited evaluation due to rotation. HEART AND MEDIASTINUM: Cardiomegaly. BONES AND SOFT TISSUES: Right shoulder arthroplasty. ACDF noted. No acute osseous abnormality. IMPRESSION: 1. Increased bilateral perihilar interstitial and airspace opacities. Limited evaluation due to rotation. 2. Cardiomegaly. Electronically signed  by: Morgane Naveau MD 10/17/2024 10:58 PM EST RP Workstation: [id number]   CT Cervical Spine Wo Contrast Result Date: 10/17/2024 EXAM: CT CERVICAL SPINE WITHOUT CONTRAST 10/17/2024 04:16:12 PM TECHNIQUE: CT of the cervical spine was performed without the administration of intravenous contrast. Multiplanar reformatted images are provided for review. Automated exposure control, iterative reconstruction, and/or weight based adjustment of the mA/kV was utilized to reduce the radiation dose to as low as reasonably achievable. COMPARISON: CT cervical spine 04/25/2020. CLINICAL HISTORY: Facial trauma, blunt. Fall. FINDINGS: CERVICAL SPINE: BONES AND ALIGNMENT: No acute fracture. Chronic grade 1 anterolisthesis of C4 on C5, C5 on C6, and C7 on T1. 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 Large chronic cyst or erosion involving the dens. Slight interval widening of the atlantodental interval measuring 2 mm and attributed to progressive arthropathy. Suspected partial fusion across the left lateral C1-C2 articulation. Progressive left atlantooccipital arthropathy. Previous C3-C4 and C6-C7 ACDF. DEGENERATIVE CHANGES: Widespread facet arthrosis with multilevel facet ankylosis. Partially calcified ligamentous thickening/pannus posterior to the dens results in mild to moderate spinal stenosis. SOFT TISSUES: No prevertebral soft tissue swelling. LUNGS: Partially visualized extensive pulmonary ground glass opacities in the right greater than left upper lobes. IMPRESSION: 1. No acute cervical spine fracture. 2. Progressive, severe C1-2  arthropathy with ligamentous thickening/pannus posterior to the dens resulting in mild to moderate spinal stenosis. 3. Partially visualized bilateral pulmonary ground-glass opacities, possibly reflecting edema or pneumonia (including viral pneumonia). Electronically signed by: Dasie Hamburg MD 10/17/2024 04:39 PM EST RP Workstation: [id number]   CT Head Wo  Contrast Result Date: 10/17/2024 EXAM: CT HEAD WITHOUT CONTRAST 10/17/2024 04:16:12 PM TECHNIQUE: CT of the head was performed without the administration of intravenous contrast. Automated exposure control, iterative reconstruction, and/or weight based adjustment of the mA/kV was utilized to reduce the radiation dose to as low as reasonably achievable. COMPARISON: Head CT 02/06/2022. CLINICAL HISTORY: Delirium. FINDINGS: BRAIN AND VENTRICLES: There is no evidence of an acute infarct, intracranial hemorrhage, mass, midline shift, hydrocephalus, or extra-axial fluid collection. There is mild cerebral atrophy. Patchy to confluent hypodensities in the cerebral white matter bilaterally are similar to the prior study and nonspecific but compatible with moderate chronic small vessel ischemic disease. Calcified atherosclerosis at the skull base. ORBITS: No acute abnormality. Bilateral cataract extraction. SINUSES: Mild mucosal thickening in the ethmoid sinuses. Clear mastoid air cells. SOFT TISSUES AND SKULL: No acute soft tissue abnormality. No skull fracture. IMPRESSION: 1. No acute intracranial abnormality. 2. Moderate chronic small vessel ischemic disease. Electronically signed by: Dasie Hamburg MD 10/17/2024 04:22 PM EST RP Workstation: [id number]    Cardiac Studies    TTE 10/18/2022:   1. Left ventricular ejection fraction, by estimation, is 65 to 70%. The  left ventricle has normal function. The left ventricle has no regional  wall motion abnormalities. There is moderate asymmetric left ventricular  hypertrophy of the basal-septal  segment. Left ventricular diastolic parameters are indeterminate. Elevated  left atrial pressure.   2. Right ventricular systolic function is normal. The right ventricular  size is normal. There is severely elevated pulmonary artery systolic  pressure. The estimated right ventricular systolic pressure is 61.8 mmHg.   3. Left atrial size was severely dilated.   4. Right  atrial size was severely dilated.   5. The mitral valve is degenerative. Moderate mitral valve regurgitation.  No evidence of mitral stenosis. Severe mitral annular calcification.   6. Tricuspid valve regurgitation is moderate to severe.   7. The aortic valve is abnormal. There is severe calcifcation of the  aortic valve. Aortic valve regurgitation is not visualized. Moderate  aortic valve stenosis. Aortic valve area, by VTI measures 1.50 cm. Aortic  valve mean gradient measures 28.0 mmHg.  Aortic valve Vmax measures 3.47 m/s.   8. The inferior vena cava is dilated in size with <50% respiratory  variability, suggesting right atrial pressure of 15 mmHg.   NM SPECT 03/17/21: ST segment depression was noted during stress in the II, III, aVF, V4 and V5 leads. No T wave inversion was noted during stress. This is a low risk study. The left ventricular ejection fraction is hyperdynamic (>65%).  Patient Profile     83 y.o. 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 Dennise for evaluation of NSTEMI.  Patient suffered multiple falls at home and was brought to the ED via EMS and found to be septic from lower extremity cellulitis and admitted to medicine.  She was given IV fluids per sepsis protocol and broad-spectrum antibiotics.  She became short of breath and found to have interstitial opacities and  therefore IV Lasix  was given.  Troponin was elevated at 4245->4193.  She was given aspirin  and heparin  was started and cardiology was consulted.  Assessment & Plan   Sepsis secondary to lower extremity cellulitis  Elevated troponin, presumably due to demand ischemia in the setting of multiple falls, sepsis and volume overload from IV  fluids - Troponin 4245-> 4193. Likely has underlying CAD  ECG shows normal sinus rhythm with nonspecific ST changes with a low risk SPECT stress with borderline TID without qualitative dilatation in 2022.  Currently on heparin .  Echo this hospitalization without regional wall motion abnormalities and preserved ejection fraction. Able to ambulate without chest pain or worsening shortness of breath (has chronic SOB). Ok to stop heparin . Continue with lasix  20 mg daily, continue aspirin  81 mg and  Acute respiratory failure suspected secondary to heart failure with preserved ejection fraction in the setting of moderate aortic stenosis- on room air after Lasix  x1 in the ED. Continue with lasix  20 mg PO daily. Moderate aortic stenosis -stable from 2023 Hypomagnesemia - per primary team Rheumatoid arthritis Hypertension Hyperlipidemia allergy listed to zetia. Started on rosuvastatin   Time spent coordinating care: 35 minutes  No further recommendations at this time. Cardiology will sign off. Will have the patient follow up in 1-2 weeks post hospitalization. Thank you.     For questions or updates, please contact [facility] Please consult [email or web address] for contact info under        Signed, Emeline Calender, DO 10/19/2024, 2:23 PM

## 2024-10-20 DIAGNOSIS — Z515 Encounter for palliative care: Secondary | ICD-10-CM | POA: Diagnosis not present

## 2024-10-20 DIAGNOSIS — Z7189 Other specified counseling: Secondary | ICD-10-CM

## 2024-10-20 DIAGNOSIS — A419 Sepsis, unspecified organism: Secondary | ICD-10-CM | POA: Diagnosis not present

## 2024-10-20 DIAGNOSIS — L03115 Cellulitis of right lower limb: Secondary | ICD-10-CM | POA: Diagnosis not present

## 2024-10-20 LAB — CBC WITH DIFFERENTIAL/PLATELET
Abs Immature Granulocytes: 0.17 K/uL — ABNORMAL HIGH (ref 0.00–0.07)
Basophils Absolute: 0 K/uL (ref 0.0–0.1)
Basophils Relative: 0 %
Eosinophils Absolute: 0 K/uL (ref 0.0–0.5)
Eosinophils Relative: 0 %
HCT: 31.8 % — ABNORMAL LOW (ref 36.0–46.0)
Hemoglobin: 10.6 g/dL — ABNORMAL LOW (ref 12.0–15.0)
Immature Granulocytes: 1 %
Lymphocytes Relative: 2 %
Lymphs Abs: 0.5 K/uL — ABNORMAL LOW (ref 0.7–4.0)
MCH: 28.4 pg (ref 26.0–34.0)
MCHC: 33.3 g/dL (ref 30.0–36.0)
MCV: 85.3 fL (ref 80.0–100.0)
Monocytes Absolute: 0.5 K/uL (ref 0.1–1.0)
Monocytes Relative: 3 %
Neutro Abs: 18.2 K/uL — ABNORMAL HIGH (ref 1.7–7.7)
Neutrophils Relative %: 94 %
Platelets: 276 K/uL (ref 150–400)
RBC: 3.73 MIL/uL — ABNORMAL LOW (ref 3.87–5.11)
RDW: 14 % (ref 11.5–15.5)
WBC: 19.4 K/uL — ABNORMAL HIGH (ref 4.0–10.5)
nRBC: 0 % (ref 0.0–0.2)

## 2024-10-20 LAB — COMPREHENSIVE METABOLIC PANEL WITH GFR
ALT: 64 U/L — ABNORMAL HIGH (ref 0–44)
AST: 54 U/L — ABNORMAL HIGH (ref 15–41)
Albumin: 2.5 g/dL — ABNORMAL LOW (ref 3.5–5.0)
Alkaline Phosphatase: 87 U/L (ref 38–126)
Anion gap: 13 (ref 5–15)
BUN: 34 mg/dL — ABNORMAL HIGH (ref 8–23)
CO2: 21 mmol/L — ABNORMAL LOW (ref 22–32)
Calcium: 8.2 mg/dL — ABNORMAL LOW (ref 8.9–10.3)
Chloride: 104 mmol/L (ref 98–111)
Creatinine, Ser: 1.06 mg/dL — ABNORMAL HIGH (ref 0.44–1.00)
GFR, Estimated: 51 mL/min — ABNORMAL LOW (ref >60)
Glucose, Bld: 108 mg/dL — ABNORMAL HIGH (ref 70–99)
Potassium: 3.6 mmol/L (ref 3.5–5.1)
Sodium: 138 mmol/L (ref 135–145)
Total Bilirubin: 0.7 mg/dL (ref 0.0–1.2)
Total Protein: 5.3 g/dL — ABNORMAL LOW (ref 6.5–8.1)

## 2024-10-20 LAB — C-REACTIVE PROTEIN: CRP: 10.3 mg/dL — ABNORMAL HIGH (ref <1.0)

## 2024-10-20 LAB — PHOSPHORUS: Phosphorus: 2.9 mg/dL (ref 2.5–4.6)

## 2024-10-20 LAB — MAGNESIUM: Magnesium: 2.4 mg/dL (ref 1.7–2.4)

## 2024-10-20 LAB — PROCALCITONIN: Procalcitonin: 12.5 ng/mL

## 2024-10-20 MED ORDER — LOPERAMIDE HCL 2 MG PO CAPS
4.0000 mg | ORAL_CAPSULE | ORAL | Status: DC | PRN
  Administered 2024-10-21 – 2024-10-22 (×4): 4 mg via ORAL
  Filled 2024-10-20 (×5): qty 2

## 2024-10-20 MED ORDER — LACTATED RINGERS IV SOLN: INTRAVENOUS | Status: AC

## 2024-10-20 MED ORDER — LOPERAMIDE HCL 2 MG PO CAPS
2.0000 mg | ORAL_CAPSULE | ORAL | Status: DC | PRN
  Administered 2024-10-20: 2 mg via ORAL
  Filled 2024-10-20: qty 1

## 2024-10-20 MED ORDER — LEVOTHYROXINE SODIUM 75 MCG PO TABS
75.0000 ug | ORAL_TABLET | Freq: Every day | ORAL | Status: DC
  Administered 2024-10-20 – 2024-10-23 (×4): 75 ug via ORAL
  Filled 2024-10-20 (×4): qty 1

## 2024-10-20 MED ORDER — ONDANSETRON HCL 4 MG PO TABS
4.0000 mg | ORAL_TABLET | Freq: Two times a day (BID) | ORAL | Status: DC
  Administered 2024-10-20 – 2024-10-23 (×6): 4 mg via ORAL
  Filled 2024-10-20 (×6): qty 1

## 2024-10-20 MED ORDER — PREDNISONE 20 MG PO TABS
20.0000 mg | ORAL_TABLET | Freq: Every day | ORAL | Status: DC
  Administered 2024-10-21 – 2024-10-23 (×3): 20 mg via ORAL
  Filled 2024-10-20 (×3): qty 1

## 2024-10-20 MED ORDER — LEVOTHYROXINE SODIUM 75 MCG PO TABS: 75.0000 ug | ORAL_TABLET | Freq: Every day | ORAL | Status: DC

## 2024-10-20 MED ORDER — HYDROCORTISONE ACETATE 25 MG RE SUPP
25.0000 mg | Freq: Two times a day (BID) | RECTAL | Status: DC
  Administered 2024-10-20 – 2024-10-23 (×4): 25 mg via RECTAL
  Filled 2024-10-20 (×7): qty 1

## 2024-10-20 MED ORDER — CARVEDILOL 3.125 MG PO TABS
3.1250 mg | ORAL_TABLET | Freq: Two times a day (BID) | ORAL | Status: DC
  Administered 2024-10-20 – 2024-10-23 (×7): 3.125 mg via ORAL
  Filled 2024-10-20 (×7): qty 1

## 2024-10-20 NOTE — Progress Notes (Addendum)
 PROGRESS NOTE     Patient Demographics:    Isabel Barnes, is a 83 y.o. female, DOB - 03/06/1941, FMW:[id number]  Outpatient Primary MD for the patient is Hartwell Elvie RIGGERS    LOS - 3  Admit date - 10/17/2024    Chief Complaint  Patient presents with   Fall       Brief Narrative (HPI from H&P)   83 year old female with PMHx of rheumatoid arthritis, osteoarthritis, chronic BLE lymphedema, chronic RLE ulcer (follows w/ ortho, Dr. Harden), chronic osteomyelitis of tibia and calcaneus (follows w/ ID, Dr. Fleeta Rothman), IDA, hypothyroidism, HTN, chronic pain syndrome, remote CVA, who presented to [facility] ED on 10/17/2024 after suffering 2 falls, 1 on the day of presentation and 1 on the prior day, with generalized fatigue and weakness after receiving COVID vaccine, found down surrounded by emesis, urine, and diarrhea by caregiver.    In the ED, she was febrile to 102.4 F, tachycardic to 108, BP 150/78, SpO2 94% she was diagnosed with severe sepsis due to right lower extremity cellulitis in the setting of chronic wounds, she was revived placed on antibiotics, unfortunately she went into acute on chronic diastolic CHF with demand ischemia.  Antley being treated for NSTEMI and sepsis.   Subjective:    [name] today has, No headache, No chest pain, No abdominal pain - No Nausea, No new weakness tingling or numbness, right lower extremity pain has improved.   Assessment  & Plan :     # Sepsis secondary to right lower extremity cellulitis in the setting of chronic RLE ulcer - Presented with fever to 102.4 F, tachycardic to 108, leukocytosis to 24.2, lactate elevated to 4.0, in the setting of RLE cellulitis meeting SIRS criteria  for sepsis, sepsis pathophysiology has resolved, initially on stress dose steroids now on close to home dose oral steroid, on empiric antibiotics, blood cultures negative, inflammatory markers still elevated, leukocytosis had initially improved but now worsened again, her ulcer appears to be more consistent with venous stasis, ABIs suggest sufficient flow for healing, CT soft tissue does not show any abscess, seen  by plastic surgeon, no intervention advised.  Do not think this is pyoderma gangrenosum however will have her follow-up with dermatology outpatient, appreciate ID input and adjustment of antibiotics is requires 1 more week of oral Zyvox  and Augmentin .  Daughter also wants GOC DW with Pall care- will consult.    # Chronic RLE ulcer, # Chronic osteomyelitis of calcaneus- has had multiple courses of antibiotics has been followed by ID in the outpatient setting, will consult ID as leukocytosis worse on 10/19/2024, ID/plastics also consulted on 10/19/2024.     # Chronic BLE lymphedema  - Supportive care Unna boots once better   C. difficile antigen positive.  Discussed with ID on 10/19/2024.  This is not a clinical infection.  Supportive care.    # Type I MI versus type II MI with acute on chronic diastolic CHF causing acute hypoxic respiratory failure -  Likely due to IV fluid resuscitation required for sepsis, no chest pain, EKG nonspecific changes, echo with a preserved EF of 65% and no wall motion abnormality, does have severely elevated pulmonary pressures (estimated RV systolic pressure 61.8 L MAC), severe biatrial dilation, moderate MR with severe annular calcification, moderate TR, severe aortic valve calcification with moderate stenosis, and elevated right atrial pressure.  Neurology on board, IV heparin  drip likely for 48 hours defer this to cardiology, remains chest pain-free, hypoxic respiratory failure resolved currently symptom-free on room air.  # Symptomatic transaminitis due to  shock liver from hypotension and sepsis - Asymptomatic, no abdominal pain or RUQ tenderness total bilirubin stable, trend improving.  Symptom-free.   # Rheumatoid arthritis - On chronic steroids at home, was initially on stress dose steroids tapered to oral steroid prednisone  20 mg daily on 10/19/2024   # Hypertension - Pressure stabilized Taper down stress dose steroids to oral prednisone , she is chronically on prednisone , if blood pressure remains stable, we will add Coreg  with caution and monitor.   # Hypothyroidism  - Continue home Synthroid    # Generalized weakness and debility  # Multiple falls # Disposition- PT OT may require SNF, she lives by herself.       Condition - Extremely Guarded  Family Communication  : Daughter updated on the day of admission 10/17/2024, 10/20/24  Code Status : DNR  Consults  : Cardiology, orthopedic/plastics, ID, Pall care  PUD Prophylaxis :     Procedures  :     ABI bilateral lower extremity.  Right: Resting right ankle-brachial index indicates noncompressible right lower extremity arteries. Right toe pressure is >60 mmHg which suggests adequate perfusion for healing. Left: Resting left ankle-brachial index indicates noncompressible left lower extremity arteries. Left toe pressure is >60 mmHg which suggests adequate perfusion for healing.  CT soft tissue right lower extremity 1. Large soft tissue defect within the anteromedial proximal right lower leg consistent with known wound. Retained staples are seen along the margins of the wound, likely from prior surgical intervention and skin graft. No significant change since prior MRI. 2. Stable marked ventral cortical thickening of the right tibial diaphysis, compatible with chronic osteomyelitis described on previous MRI. No new areas of bony destruction or periosteal reaction on this exam. 3. Diffuse dermal thickening and subcutaneous edema of the distal right lower leg compatible with cellulitis. No  evidence of fluid collection or abscess  TTE -  1. Left ventricular ejection fraction, by estimation, is 65 to 70%. The left ventricle has normal function. The left ventricle has no regional wall motion abnormalities. There is moderate  asymmetric left ventricular hypertrophy of the basal-septal segment. Left ventricular diastolic parameters are indeterminate. Elevated left atrial pressure.  2. Right ventricular systolic function is normal. The right ventricular size is normal. There is severely elevated pulmonary artery systolic pressure. The estimated right ventricular systolic pressure is 61.8 mmHg.  3. Left atrial size was severely dilated.  4. Right atrial size was severely dilated.  5. The mitral valve is degenerative. Moderate mitral valve regurgitation. No evidence of mitral stenosis. Severe mitral annular calcification.  6. Tricuspid valve regurgitation is moderate to severe.  7. The aortic valve is abnormal. There is severe calcifcation of the aortic valve. Aortic valve regurgitation is not visualized. Moderate aortic valve stenosis. Aortic valve area, by VTI measures 1.50 cm. Aortic valve mean gradient measures 28.0 mmHg. Aortic valve Vmax measures 3.47 m/s.  8. The inferior vena cava is dilated in size with <50% respiratory variability, suggesting right atrial pressure of 15 mmHg  CT head and C-spine.  Nonacute.      Disposition Plan  :    Status is: Inpatient  DVT Prophylaxis  :  Heparin  gtt    Lab Results  Component Value Date   PLT 276 10/20/2024    Diet :  Diet Order             DIET SOFT Fluid consistency: Thin  Diet effective now                    Inpatient Medications  Scheduled Meds:  amoxicillin -clavulanate  1 tablet Oral BID   aspirin  EC  81 mg Oral Daily   cycloSPORINE   1 drop Both Eyes Daily   enoxaparin  (LOVENOX ) injection  40 mg Subcutaneous Q24H   HYDROcodone -acetaminophen   1 tablet Oral Q6H   levothyroxine   75 mcg Oral Q0600   linezolid   600 mg  Oral Q12H   [START ON 10/21/2024] predniSONE   20 mg Oral Q breakfast   rosuvastatin   10 mg Oral Daily   Continuous Infusions:  lactated ringers      PRN Meds:.acetaminophen  **OR** acetaminophen , hydrOXYzine , loperamide , methocarbamol , naphazoline-glycerin , nitroGLYCERIN , [DISCONTINUED] ondansetron  **OR** ondansetron  (ZOFRAN ) IV, triamcinolone   Antibiotics  :    Anti-infectives (From admission, onward)    Start     Dose/Rate Route Frequency Ordered Stop   10/19/24 1800  linezolid  (ZYVOX ) tablet 600 mg        600 mg Oral Every 12 hours 10/19/24 1219 10/26/24 1759   10/19/24 1800  Ampicillin-Sulbactam (UNASYN) 3 g in sodium chloride  0.9 % 100 mL IVPB  Status:  Discontinued        3 g 200 mL/hr over 30 Minutes Intravenous Every 8 hours 10/19/24 1219 10/19/24 1310   10/19/24 1400  amoxicillin -clavulanate (AUGMENTIN ) 500-125 MG per tablet 1 tablet        1 tablet Oral 2 times daily 10/19/24 1310 10/26/24 0959   10/17/24 1830  linezolid  (ZYVOX ) IVPB 600 mg  Status:  Discontinued        600 mg 300 mL/hr over 60 Minutes Intravenous Every 12 hours 10/17/24 1816 10/19/24 1219   10/17/24 1830  piperacillin -tazobactam (ZOSYN ) IVPB 3.375 g  Status:  Discontinued        3.375 g 12.5 mL/hr over 240 Minutes Intravenous Every 8 hours 10/17/24 1816 10/19/24 1219   10/17/24 1745  vancomycin  (VANCOCIN ) capsule 500 mg  Status:  Discontinued        500 mg Oral 4 times daily 10/17/24 1731 10/17/24 1814   10/17/24 1730  fluconazole  (DIFLUCAN ) tablet  150 mg  Status:  Discontinued        150 mg Oral Weekly 10/17/24 1726 10/17/24 2252   10/17/24 1230  ceFEPIme  (MAXIPIME ) 2 g in sodium chloride  0.9 % 100 mL IVPB        2 g 200 mL/hr over 30 Minutes Intravenous  Once 10/17/24 1222 10/17/24 1353   10/17/24 1230  vancomycin  (VANCOCIN ) IVPB 1000 mg/200 mL premix        1,000 mg 200 mL/hr over 60 Minutes Intravenous  Once 10/17/24 1222 10/17/24 1624   10/17/24 1230  metroNIDAZOLE  (FLAGYL ) IVPB 500 mg        500  mg 100 mL/hr over 60 Minutes Intravenous  Once 10/17/24 1222 10/17/24 1504         Objective:   Vitals:   10/19/24 2351 10/20/24 0333 10/20/24 0346 10/20/24 0808  BP: (!) 151/70 (!) 165/67  (!) 151/74  Pulse: 67 80  68  Resp: 18 17  16   Temp: 97.9 F (36.6 C) 98 F (36.7 C)  (!) 97.4 F (36.3 C)  TempSrc: Oral Oral  Oral  SpO2: 99% 97%  98%  Weight:   68 kg   Height:        Wt Readings from Last 3 Encounters:  10/20/24 68 kg  12/19/23 55.3 kg  10/24/23 55.3 kg     Intake/Output Summary (Last 24 hours) at 10/20/2024 9077 Last data filed at 10/19/2024 1450 Gross per 24 hour  Intake 130.13 ml  Output --  Net 130.13 ml     Physical Exam  Awake Alert, No new F.N deficits, Normal affect [place].AT,PERRAL Supple Neck, No JVD,   Symmetrical Chest wall movement, Good air movement bilaterally, CTAB RRR,No Gallops,Rubs or new Murmurs,  +ve B.Sounds, Abd Soft, No tenderness,   No Cyanosis, Clubbing or edema     RN pressure injury documentation: Wound 10/18/24 Pressure Injury Heel Right Stage 3 -  Full thickness tissue loss. Subcutaneous fat may be visible but bone, tendon or muscle are NOT exposed. (Active)      Data Review:    Recent Labs  Lab 10/17/24 1204 10/17/24 2259 10/18/24 0443 10/19/24 0702 10/20/24 0348  WBC 24.2* 20.1* 16.0* 22.2* 19.4*  HGB 11.6* 11.7* 10.7* 10.8* 10.6*  HCT 36.4 37.2 33.3* 33.3* 31.8*  PLT 308 289 225 229 276  MCV 88.3 88.4 87.6 87.4 85.3  MCH 28.2 27.8 28.2 28.3 28.4  MCHC 31.9 31.5 32.1 32.4 33.3  RDW 14.1 14.2 14.2 14.2 14.0  LYMPHSABS 0.4*  --  0.4* 0.6* 0.5*  MONOABS 0.8  --  0.3 0.9 0.5  EOSABS 0.1  --  0.0 0.0 0.0  BASOSABS 0.1  --  0.0 0.0 0.0    Recent Labs  Lab 10/17/24 1204 10/17/24 1220 10/17/24 1413 10/17/24 2259 10/17/24 2300 10/18/24 0443 10/19/24 0702 10/20/24 0348  NA 137  --   --   --   --  140 138 138  K 3.9  --   --   --   --  3.6 3.9 3.6  CL 101  --   --   --   --  105 104 104  CO2 22  --    --   --   --  25 18* 21*  ANIONGAP 14  --   --   --   --  10 16* 13  GLUCOSE 129*  --   --   --   --  137* 133* 108*  BUN 15  --   --   --   --  16 27* 34*  CREATININE 0.96  --   --  0.93  --  0.96 0.94 1.06*  AST 92*  --   --   --   --  99* 80* 54*  ALT 113*  --   --   --   --  82* 68* 64*  ALKPHOS 138*  --   --   --   --  98 98 87  BILITOT 1.1  --   --   --   --  1.0 0.6 0.7  ALBUMIN  3.4*  --   --   --   --  2.4* 2.4* 2.5*  CRP  --   --   --   --   --  17.5* 18.5* 10.3*  PROCALCITON  --   --   --  17.40  --  21.50  --  12.50  LATICACIDVEN  --  3.5* 4.0*  --   --  1.5  --   --   INR 1.0  --   --   --   --   --   --   --   BNP  --   --   --   --  2,245.5*  --   --   --   MG  --   --   --   --   --  1.6* 2.5* 2.4  PHOS  --   --   --   --   --  3.1 2.9 2.9  CALCIUM  8.7*  --   --   --   --  8.1* 8.0* 8.2*      Recent Labs  Lab 10/17/24 1204 10/17/24 1220 10/17/24 1413 10/17/24 2259 10/17/24 2300 10/18/24 0443 10/19/24 0702 10/20/24 0348  CRP  --   --   --   --   --  17.5* 18.5* 10.3*  PROCALCITON  --   --   --  17.40  --  21.50  --  12.50  LATICACIDVEN  --  3.5* 4.0*  --   --  1.5  --   --   INR 1.0  --   --   --   --   --   --   --   BNP  --   --   --   --  2,245.5*  --   --   --   MG  --   --   --   --   --  1.6* 2.5* 2.4  CALCIUM  8.7*  --   --   --   --  8.1* 8.0* 8.2*    --------------------------------------------------------------------------------------------------------------- Lab Results  Component Value Date   CHOL 162 09/13/2023   HDL 50 09/13/2023   LDLCALC 103 (H) 09/13/2023   TRIG 45 09/13/2023   CHOLHDL 3.2 09/13/2023    No results found for: HGBA1C No results for input(s): TSH, T4TOTAL, FREET4, T3FREE, THYROIDAB in the last 72 hours. No results for input(s): VITAMINB12, FOLATE, FERRITIN, TIBC, IRON, RETICCTPCT in the last 72  hours. ------------------------------------------------------------------------------------------------------------------ Cardiac Enzymes No results for input(s): CKMB, TROPONINI, MYOGLOBIN in the last 168 hours.  Invalid input(s): CK  Micro Results Recent Results (from the past 240 hours)  Culture, blood (Routine x 2)     Status: None (Preliminary result)   Collection Time: 10/17/24 12:02 PM   Specimen: BLOOD LEFT ARM  Result Value Ref Range Status   Specimen Description BLOOD LEFT ARM  Final   Special Requests   Final  BOTTLES DRAWN AEROBIC AND ANAEROBIC Blood Culture results may not be optimal due to an inadequate volume of blood received in culture bottles   Culture   Final    NO GROWTH 3 DAYS Performed at [facility], [address] [place], [place] [address]    Report Status PENDING  Incomplete  Culture, blood (Routine x 2)     Status: None (Preliminary result)   Collection Time: 10/17/24 12:02 PM   Specimen: BLOOD RIGHT ARM  Result Value Ref Range Status   Specimen Description BLOOD RIGHT ARM  Final   Special Requests   Final    AEROBIC BOTTLE ONLY Blood Culture results may not be optimal due to an inadequate volume of blood received in culture bottles   Culture   Final    NO GROWTH 3 DAYS Performed at [facility], [address] [place], [place] [address]    Report Status PENDING  Incomplete  Resp panel by RT-PCR (RSV, Flu A&B, Covid) Anterior Nasal Swab     Status: None   Collection Time: 10/17/24 12:17 PM   Specimen: Anterior Nasal Swab  Result Value Ref Range Status   SARS Coronavirus 2 by RT PCR NEGATIVE NEGATIVE Final   Influenza A by PCR NEGATIVE NEGATIVE Final   Influenza B by PCR NEGATIVE NEGATIVE Final    Comment: (NOTE) The Xpert Xpress SARS-CoV-2/FLU/RSV plus assay is intended as an aid in the diagnosis of influenza from Nasopharyngeal swab specimens and should not be used as a sole basis for treatment. Nasal washings  and aspirates are unacceptable for Xpert Xpress SARS-CoV-2/FLU/RSV testing.  Fact Sheet for Patients: bloggercourse.com  Fact Sheet for Healthcare Providers: seriousbroker.it  This test is not yet approved or cleared by the [place]  FDA and has been authorized for detection and/or diagnosis of SARS-CoV-2 by FDA under an Emergency Use Authorization (EUA). This EUA will remain in effect (meaning this test can be used) for the duration of the COVID-19 declaration under Section 564(b)(1) of the Act, 21 U.S.C. section 360bbb-3(b)(1), unless the authorization is terminated or revoked.     Resp Syncytial Virus by PCR NEGATIVE NEGATIVE Final    Comment: (NOTE) Fact Sheet for Patients: bloggercourse.com  Fact Sheet for Healthcare Providers: seriousbroker.it  This test is not yet approved or cleared by the [place]  FDA and has been authorized for detection and/or diagnosis of SARS-CoV-2 by FDA under an Emergency Use Authorization (EUA). This EUA will remain in effect (meaning this test can be used) for the duration of the COVID-19 declaration under Section 564(b)(1) of the Act, 21 U.S.C. section 360bbb-3(b)(1), unless the authorization is terminated or revoked.  Performed at [facility], [address] [place], [place] [address]   Respiratory (~20 pathogens) panel by PCR     Status: None   Collection Time: 10/17/24 12:17 PM   Specimen: Nasopharyngeal Swab; Respiratory  Result Value Ref Range Status   Adenovirus NOT DETECTED NOT DETECTED Final   Coronavirus 229E NOT DETECTED NOT DETECTED Final    Comment: (NOTE) The Coronavirus on the Respiratory Panel, DOES NOT test for the novel  Coronavirus (2019 nCoV)    Coronavirus HKU1 NOT DETECTED NOT DETECTED Final   Coronavirus NL63 NOT DETECTED NOT DETECTED Final   Coronavirus OC43 NOT DETECTED NOT DETECTED Final    Metapneumovirus NOT DETECTED NOT DETECTED Final   Rhinovirus / Enterovirus NOT DETECTED NOT DETECTED Final   Influenza A NOT DETECTED NOT DETECTED Final   Influenza  B NOT DETECTED NOT DETECTED Final   Parainfluenza Virus 1 NOT DETECTED NOT DETECTED Final   Parainfluenza Virus 2 NOT DETECTED NOT DETECTED Final   Parainfluenza Virus 3 NOT DETECTED NOT DETECTED Final   Parainfluenza Virus 4 NOT DETECTED NOT DETECTED Final   Respiratory Syncytial Virus NOT DETECTED NOT DETECTED Final   Bordetella pertussis NOT DETECTED NOT DETECTED Final   Bordetella Parapertussis NOT DETECTED NOT DETECTED Final   Chlamydophila pneumoniae NOT DETECTED NOT DETECTED Final   Mycoplasma pneumoniae NOT DETECTED NOT DETECTED Final    Comment: Performed at [facility], [address] [place], [place] [address]  C Difficile Quick Screen w PCR reflex     Status: Abnormal   Collection Time: 10/17/24  5:28 PM   Specimen: STOOL  Result Value Ref Range Status   C Diff antigen POSITIVE (A) NEGATIVE Final   C Diff toxin NEGATIVE NEGATIVE Final   C Diff interpretation Results are indeterminate. See PCR results.  Final    Comment: Performed at [facility], [address] [place], [place] [address]  C. Diff by PCR, Reflexed     Status: None   Collection Time: 10/17/24  5:28 PM  Result Value Ref Range Status   Toxigenic C. Difficile by PCR NEGATIVE NEGATIVE Final    Comment: Patient is colonized with non toxigenic C. difficile. May not need treatment unless significant symptoms are present.   Hypervirulent Strain PRESUMPTIVE NEGATIVE PRESUMPTIVE NEGATIVE Final    Comment: Performed at [facility], [address] [place], [place] [address]  MRSA Next Gen by PCR, Nasal     Status: None   Collection Time: 10/18/24  4:14 AM   Specimen: Nasal Mucosa; Nasal Swab  Result Value Ref Range Status   MRSA by PCR Next Gen NOT DETECTED NOT DETECTED Final    Comment: (NOTE) The GeneXpert MRSA Assay (FDA approved  for NASAL specimens only), is one component of a comprehensive MRSA colonization surveillance program. It is not intended to diagnose MRSA infection nor to guide or monitor treatment for MRSA infections. Test performance is not FDA approved in patients less than 64 years old. Performed at [facility], [address] [place], [place] [address]     Radiology Report VAS US  ABI WITH/WO TBI Result Date: 10/19/2024  LOWER EXTREMITY DOPPLER STUDY Patient Name:  JOYE WESENBERG  Date of Exam:   10/19/2024 Medical Rec #: [id number]          Accession #:    [id number] Date of Birth: 06-09-1941           Patient Gender: F Patient Age:   40 years Exam Location:  [facility] Procedure:      VAS US  ABI WITH/WO TBI Referring Phys: LAVADA [facility] --------------------------------------------------------------------------------  Indications: Ulceration (Chronic) High Risk Factors: Hypertension, hyperlipidemia, no history of smoking.  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 Limitations: Today's exam was limited due to Bandages, skin texture, edema,              venous interference. Comparison Study: Prior ABI done 09/09/23 Performing Technologist: Rachel Pellet RVS  Examination Guidelines: A complete evaluation includes at minimum, Doppler waveform signals and systolic blood pressure reading at the level of bilateral brachial, anterior tibial, and posterior tibial arteries, when vessel segments are accessible. Bilateral testing is considered an integral part of a complete examination. Photoelectric Plethysmograph (PPG) waveforms and toe systolic pressure readings are included as required  and additional duplex testing as needed. Limited examinations for reoccurring indications may be performed as noted.  ABI Findings: +---------+------------------+-----+---------+--------+ Right    Rt Pressure (mmHg)IndexWaveform Comment   +---------+------------------+-----+---------+--------+ Brachial 129                    triphasic         +---------+------------------+-----+---------+--------+ PTA      147               1.14 biphasic          +---------+------------------+-----+---------+--------+ DP       254               1.97 biphasic          +---------+------------------+-----+---------+--------+ Great Toe61                0.47 Normal            +---------+------------------+-----+---------+--------+ +---------+------------------+-----+-----------+-------+ Left     Lt Pressure (mmHg)IndexWaveform   Comment +---------+------------------+-----+-----------+-------+ Brachial 117                    triphasic          +---------+------------------+-----+-----------+-------+ PTA      254               1.97 multiphasic        +---------+------------------+-----+-----------+-------+ DP       185               1.43 multiphasic        +---------+------------------+-----+-----------+-------+ Great Toe70                0.54 Normal             +---------+------------------+-----+-----------+-------+ +-------+-----------+-----------+------------+------------+ ABI/TBIToday's ABIToday's TBIPrevious ABIPrevious TBI +-------+-----------+-----------+------------+------------+ Right  1.97       0.47       1.37        0.25         +-------+-----------+-----------+------------+------------+ Left   1.97       0.54       1.49        0.84         +-------+-----------+-----------+------------+------------+ Arterial wall calcification precludes accurate ankle pressures and ABIs. Bilateral ABIs appear essentially unchanged compared to prior study on 09/09/23. Right TBIs appear increased. Left TBIs appear decreased.  Summary: Right: Resting right ankle-brachial index indicates noncompressible right lower extremity arteries. Right toe pressure is >60 mmHg which suggests adequate perfusion for healing.  Left: Resting left ankle-brachial index indicates noncompressible left lower extremity arteries. Left toe pressure is >60 mmHg which suggests adequate perfusion for healing. *See table(s) above for measurements and observations.  Electronically signed by Fonda Rim on 10/19/2024 at 7:27:59 PM.    Final    CT TIBIA FIBULA RIGHT W WO CONTRAST Result Date: 10/19/2024 CLINICAL DATA:  Soft tissue infection suspected EXAM: CT OF THE LOWER RIGHT EXTREMITY WITHOUT CONTRAST TECHNIQUE: Multidetector CT imaging of the lower right extremity from the distal femur through the right ankle was performed following the standard protocol before and during bolus administration of intravenous contrast. RADIATION DOSE REDUCTION: This exam was performed according to the departmental dose-optimization program which includes automated exposure control, adjustment of the mA and/or kV according to patient size and/or use of iterative reconstruction technique. CONTRAST:  75mL OMNIPAQUE  IOHEXOL  350 MG/ML SOLN COMPARISON:  07/26/2024, 09/08/2023 FINDINGS: Bones/Joint/Cartilage Unremarkable right knee arthroplasty. There is stable cortical thickening along the ventral margin of the right tibial  diaphysis, compatible with the chronic osteomyelitis described on previous MRI. There is a metallic staple imbedded within the distal aspect of the cortical thickening, unchanged since prior MRI. There are no new areas of bony destruction or periosteal reaction. There are no acute displaced fractures. The bones are diffusely osteopenic. Prominent osteoarthritis of the right ankle and foot. Ligaments Suboptimally assessed by CT. Muscles and Tendons No acute findings. Soft tissues There is marked dermal thickening and subcutaneous edema involving the distal right lower leg, ankle, and visualized portions of the right foot. No evidence of fluid collection or abscess. No subcutaneous gas. There is a large soft tissue wound involving the anteromedial  aspect of the proximal right lower leg, with multiple surgical staples within the subcutaneous tissues along the margins of this defect. Reconstructed images demonstrate no additional findings. IMPRESSION: 1. Large soft tissue defect within the anteromedial proximal right lower leg consistent with known wound. Retained staples are seen along the margins of the wound, likely from prior surgical intervention and skin graft. No significant change since prior MRI. 2. Stable marked ventral cortical thickening of the right tibial diaphysis, compatible with chronic osteomyelitis described on previous MRI. No new areas of bony destruction or periosteal reaction on this exam. 3. Diffuse dermal thickening and subcutaneous edema of the distal right lower leg compatible with cellulitis. No evidence of fluid collection or abscess. Electronically Signed   By: Ozell Daring M.D.   On: 10/19/2024 18:46     Signature  -   Lavada Stank M.D on 10/20/2024 at 9:22 AM   -  To page go to [email or web address]

## 2024-10-20 NOTE — Consult Note (Signed)
 Palliative Care Consult Note                                  Date: 10/20/2024   Patient Name: Isabel Barnes  DOB: [birth year]  MRN: [id number]  Age / Sex: 83 y.o., female  PCP: Hartwell Elvie RIGGERS Referring Physician: Dennise Lavada POUR, MD  Reason for Consultation: Establishing goals of care  HPI/Patient Profile: 83 y.o. female  with past medical history of rheumatoid arthritis, osteoarthritis, chronic BLE lymphedema, chronic RLE ulcer (follows with ortho- Dr. Harden), chronic osteomyelitis of tibia and calcaneus (follows with ID- Dr. Fleeta Rothman), anemia of chronic disease, hypothyroidism, HTN, CVA, and moderate aortic stenosis admitted on 10/17/2024 after falling on the day of presentation and the day prior to presentation.   In the ED she was diagnosed with sepsis from lower extremity cellulitis and placed on antibiotics. Patient became short of breath. Troponin was elevated. Cardiology consulted. Troponin elevated presumably due to demand ischemia.  PMT consulted to establish GOC.  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   Depression    GERD (gastroesophageal reflux disease)    takes Omeprazole daily   Heart murmur    mild MS, moderate-severe AS 03/17/21 echo   History of bronchitis    20+ yrs ago   History of kidney stones    3 surgerical removed, 1 passed   History of prolapse of bladder    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 Albuterol  inhaler as needed   Spinal headache 1991   blood patch placed   Spondylitis    Unsteady gait    occasionally   Urinary urgency     Subjective:   I have reviewed medical records including EPIC notes, labs and imaging, received updates from nursing, assessed the patient and then met with her to discuss diagnosis prognosis, GOC, EOL wishes, disposition and options.  I introduced Palliative Medicine as specialized medical care for people living with serious illness. It focuses on providing relief from symptoms and stress of a serious illness. The goal is to improve quality of life for both the patient and the family.  Today's Discussion: Patient has a good understanding of her chronic conditions and acute hospitalization. She is very involved in her healthcare including her wound care.  Patient lived at home alone. She has two children a daughter in [place] and a son in [place] . She has grandchildren  that she is very proud of. She has a caregiver four days a week for four hours each day. She also has support from friends and church members. Patient uses her walker anytime she is moving around the home. She has some DME such as a shower chair and bedside commode and was able to complete most ADLs independently. She has not driven in some time. Her family manages her finances. She shared she is becoming more forgetful and has begun writing down everything she needs to remember. She has a family history of dementia. Over the last few months she has felt weaker in general.  A discussion was had today regarding advanced directives. Patient has a DNR and MOST form.  Confirmed patient would not want an attempted resuscitation or intubation. The patient shared she has been very open with her children and medical staff about her goals of care. The patient's quality of life is the most important consideration when she is making decisions around her goals. We discussed the difference between a path where she continues to treat the treatable and a comfort focussed path.  She shared that she would not want any surgeries or aggressive measures moving forward. She is not afraid to die as her faith is very strong. She would not want to prolong her life if her quality of life were unacceptable. She admits her quality of life has been poor for some time. She is considering options moving forward- including discharge to SNF with rehab, discharge home continuing to treat the treatable, or discharging home with hospice.  If she does not chose comfort focused measures she would like outpatient palliative support at discharge. I shared my worry that she will likely require more care at discharge than she previously required-- she is also concerned about this. Her independence is vital to her quality of life. She plans to discuss options with her children.   Emotional support and therapeutic listening provided. I asked if I could reach out to her daughter- patient is okay with this. Plan to reach out to daughter tomorrow as it was after 9 pm in [place] when we finished.  Discussed the importance of continued conversation with family and the medical providers regarding overall plan of care and treatment options, ensuring decisions are within the context of the patient's values and GOCs.  Questions and concerns were addressed. The patient was encouraged to call with questions or concerns. PMT will continue to support holistically.  Review of Systems  Constitutional:  Positive for activity change.  Musculoskeletal:  Positive for arthralgias (OA and RA).  Neurological:  Positive for  weakness.    Objective:   Primary Diagnoses: Present on Admission:  Sepsis [facility])   Physical Exam Vitals reviewed.  Constitutional:      General: She is not in acute distress. HENT:     Head: Normocephalic and atraumatic.  Cardiovascular:     Rate and Rhythm: Normal rate.  Pulmonary:     Effort: Pulmonary effort is normal.  Skin:    General: Skin is warm and dry.  Neurological:     Mental Status: She is alert and oriented to person, place, and time.  Psychiatric:        Mood and Affect: Mood normal.        Behavior: Behavior normal.        Thought Content: Thought content normal.     Vital Signs:  BP (!) 128/54 (BP Location: Left Arm)   Pulse (!) 59   Temp 98.2  F (36.8 C) (Oral)   Resp 12   Ht 5' 2 (1.575 m)   Wt 68 kg   SpO2 97%   BMI 27.42 kg/m     Advanced Care Planning:   Existing Vynca/ACP Documentation: MOST form and DNR  Primary Decision Maker: PATIENT  Code Status/Advance Care Planning: DNR    Assessment & Plan:   SUMMARY OF RECOMMENDATIONS   DNR DNI Encouraged patient to discuss goals of care moving forward with her children OP Palliative? PMT to call patient's daughter Sunday 11/23 Continued PMT support  Discussed with: bedside RN and Dr. Dennise  Time Total: 120 minutes    Thank you for allowing us  to participate in the care of JIRAH RIDER PMT will continue to support holistically.   Signed by: Stephane Palin, NP Palliative Medicine Team  Team Phone # [phone] (Nights/Weekends)  10/20/2024, 4:27 PM

## 2024-10-20 NOTE — Plan of Care (Signed)

## 2024-10-20 NOTE — Consult Note (Addendum)
 Hospital Consult    Reason for Consult: Slow to heal venous ulcerations right lower extremity Requesting Physician:  [facility] MRN #:  [id number]  History of Present Illness: This is a 83 y.o. 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  She has known lymphedema and likely venous insufficiency.  Despite ongoing wound care she has made little improvement with healing.  She continues to have home health aide present during the day to help with mobility and wound care.  She denies any significant pain in the right leg including rest pain.  She is ambulatory with her walker at home.  She is a former smoker.  Surgical history significant for diagnostic angiography in October of last year which was negative for any hemodynamically significant stenosis.  She is opposed to any further surgery at this time.  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   Depression    GERD (gastroesophageal reflux disease)    takes Omeprazole daily   Heart murmur    mild MS, moderate-severe AS 03/17/21 echo   History of bronchitis    20+ yrs ago   History of kidney stones    3 surgerical removed, 1 passed   History of prolapse of bladder    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 Albuterol  inhaler as needed   Spinal headache 1991   blood patch placed   Spondylitis    Unsteady gait    occasionally   Urinary urgency     Past Surgical History:  Procedure Laterality Date   ABDOMINAL AORTOGRAM W/LOWER EXTREMITY N/A 02/15/2022   Procedure: ABDOMINAL AORTOGRAM W/LOWER EXTREMITY;  Surgeon: Sheree Penne Bruckner, MD;  Location: [facility] INVASIVE CV LAB;  Service: Cardiovascular;  Laterality: N/A;   ABDOMINAL AORTOGRAM W/LOWER EXTREMITY N/A 09/12/2023   Procedure: ABDOMINAL AORTOGRAM W/LOWER EXTREMITY;  Surgeon: Lanis Fonda BRAVO, MD;  Location: [facility] INVASIVE CV LAB;  Service: Cardiovascular;  Laterality: N/A;   ABDOMINAL HYSTERECTOMY     ANTERIOR FUSION CERVICAL SPINE     x2 -C4-7   APPLICATION OF WOUND VAC Right 03/12/2022   Procedure: APPLICATION OF WOUND VAC;  Surgeon: Harden Jerona GAILS, MD;  Location: [facility] OR;  Service: Orthopedics;  Laterality: Right;   BUNIONECTOMY Bilateral    COLONOSCOPY     CYSTOSCOPY W/ URETEROSCOPY  2012   EYE SURGERY Bilateral    cataract /lens implant   HOLMIUM LASER APPLICATION Left 02/08/2013   Procedure: HOLMIUM LASER APPLICATION;  Surgeon: Norleen JINNY Seltzer, MD;  Location: [facility];  Service: Urology;  Laterality: Left;   I & D EXTREMITY Right 02/10/2022   Procedure: DEBRIDEMENT RIGHT LEG ABSCESS;  Surgeon: Harden Jerona GAILS, MD;  Location: [facility] OR;  Service: Orthopedics;  Laterality: Right;   I & D EXTREMITY Right 03/12/2022   Procedure: RIGHT LEG IRRIGATION AND DEBRIDEMENT EXTREMITY;  Surgeon: Harden Jerona GAILS, MD;  Location: [facility] OR;  Service: Orthopedics;  Laterality: Right;   INSERTION OF MESH N/A 07/15/2014   Procedure: INSERTION OF MESH;  Surgeon: Elspeth KYM Schultze, MD;  Location: [facility] OR;  Service: General;  Laterality: N/A;   JOINT REPLACEMENT Right 2012   shoulder   LAPAROSCOPIC CHOLECYSTECTOMY W/  CHOLANGIOGRAPHY  2012   Dr Vernetta   NASAL SINUS SURGERY     OPEN REDUCTION INTERNAL FIXATION (ORIF) DISTAL RADIAL FRACTURE Left 03/20/2021   Procedure: OPEN REDUCTION INTERNAL FIXATION (ORIF) DISTAL RADIAL FRACTURE;  Surgeon: Josefina Chew, MD;  Location: [facility] OR;  Service: Orthopedics;  Laterality: Left;   RADIOLOGY WITH ANESTHESIA N/A 05/09/2014   Procedure: ADULT SEDATION WITH ANESTHESIA/MRI CERVICAL SPINE WITHOUT CONTRAST;  Surgeon: Medication Radiologist, MD;  Location: [facility] OR;  Service: Radiology;  Laterality: N/A;  DR. HAWKS/MRI   right knee arthroscopy     d/t meniscal tear   SHOULDER ARTHROSCOPY W/ ROTATOR CUFF REPAIR Bilateral three times each over several yrs   SKIN FULL THICKNESS GRAFT Right 03/12/2022   Procedure: SKIN GRAFT FULL THICKNESS;  Surgeon: Harden Jerona GAILS, MD;  Location: [facility] OR;  Service: Orthopedics;  Laterality: Right;   SKIN SPLIT GRAFT Right 02/17/2022   Procedure: RIGHT LEG SKIN GRAFT;  Surgeon: Harden Jerona GAILS, MD;  Location: [facility] OR;  Service: Orthopedics;  Laterality: Right;   THUMB ARTHROSCOPY Left    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   Procedure: RIGHT TOTAL KNEE ARTHROPLASTY;  Surgeon: Norleen Gavel, MD;  Location: [facility] OR;  Service: Orthopedics;  Laterality: Right;   UMBILICAL HERNIA REPAIR N/A 07/15/2014   Procedure: LAPAROSCOPIC UMBILICAL AND INFRAUMBILICAL HERNIA;  Surgeon: Elspeth KYM Schultze, MD;  Location: [facility] OR;  Service: General;  Laterality: N/A;    Allergies  Allergen Reactions   Dilaudid  [Hydromorphone  Hcl] Shortness Of Breath   Gabapentin Other (See Comments)    Hoarseness , headache and sore throat   Latex Rash    Severe rash   Lyrica [Pregabalin] Other (See Comments)    No balance , had to walk with cane , Blurred vision,weakness.   Oxycodone  Shortness Of Breath and Other (See Comments)    Tolerated oxycodone  during admission to hospital 02/06/22   Singulair [Montelukast] Shortness Of Breath and Other (See Comments)    Vision issues, also    Ciprofloxacin Hcl Nausea And Vomiting and Other (See Comments)    Nausea and vomiting with by mouth form   Codeine Other (See Comments)    Hallucinations   Methadone Nausea And Vomiting and Other (See Comments)    Severe nausea and vomiting   Metronidazole  Nausea And Vomiting and Other (See Comments)    Gastric pain   Oysters [Shellfish Allergy] Other (See Comments)    Terrible gastric upset and cramping.   Clindamycin /Lincomycin Diarrhea and Nausea Only   Donepezil Diarrhea and Other (See Comments)    Severe diarrhea   Ferrous Sulfate      Severe gi upset, will get an infusion when needed per caregiver   Sulfa Antibiotics Diarrhea and Other (See Comments)    GI issues  Tape Other (See Comments)    ADHESIVE TAPE-Severe rash   Zetia [Ezetimibe] Diarrhea   Elemental Sulfur Nausea And Vomiting   Iodine  Rash    Duplicate entry   Other Rash    All Antibiotic ointments/ creams   Oyster Shell Rash   Povidone Iodine  Rash and Other (See Comments)    Oyster shell products- Rash    Povidone-Iodine      Duplicate entry   Skintegrity Hydrogel [Skin Protectants, Misc.] Rash   Tapentadol Other (See Comments)    Nightmares **Nucynta**    Prior to Admission medications   Medication Sig Start Date End Date Taking? Authorizing Provider  Calcium -Vitamin D -Vitamin K ([facility] CALCIUM  CHEWS PO) Take 1 tablet by mouth daily.   Yes [provider]  cholecalciferol  (VITAMIN D ) 1000 UNITS tablet Take 1,000 Units by mouth daily.   Yes   diclofenac  sodium (VOLTAREN ) 1 % GEL Apply 2 g topically daily as needed (for knee pain).   Yes [provider]  diphenhydrAMINE  (BENADRYL ) 25 MG tablet Take 25 mg by mouth at bedtime as needed for allergies or sleep.   Yes [provider]  HYDROcodone -acetaminophen  (NORCO) 10-325 MG tablet Take 1 tablet by mouth every 6 (six) hours. 0400, 1000, 1600, 2200   Yes [provider]  hydrOXYzine  (ATARAX ) 25 MG tablet Take 25 mg by mouth 3  (three) times daily as needed for anxiety.   Yes [provider]  Lactobacillus (PROBIOTIC ACIDOPHILUS PO) Take 1 capsule by mouth daily.   Yes [provider]  levothyroxine  (SYNTHROID ) 75 MCG tablet Take 75 mcg by mouth daily. 09/24/24  Yes [provider]  loperamide  (IMODIUM  A-D) 2 MG tablet Take 4 mg by mouth 3 (three) times daily as needed for diarrhea or loose stools.   Yes [provider]  losartan  (COZAAR ) 25 MG tablet Take 1 tablet (25 mg total) by mouth daily. Patient taking differently: Take 100 mg by mouth daily. 03/23/22  Yes Medina-Vargas, Monina C, NP  Melatonin 5 MG CHEW Chew 10 mg by mouth at bedtime.   Yes [provider]  methocarbamol  (ROBAXIN ) 500 MG tablet Take 500 mg by mouth daily as needed for muscle spasms.   Yes [provider]  Multiple Vitamins-Minerals (MULTIVITAMIN PO) Take 1 tablet by mouth daily.   Yes [provider]  ondansetron  (ZOFRAN ) 4 MG tablet Take 1 tablet (4 mg total) by mouth 2 (two) times daily. Take before each dose of augmentin  and can take again if needed up to 3 doses per day Patient taking differently: Take 4 mg by mouth 2 (two) times daily as needed for nausea or vomiting. Take before each dose of augmentin  and can take again if needed up to 3 doses per day 05/17/24  Yes Fleeta Rothman, Jomarie SAILOR, MD  RESTASIS  0.05 % ophthalmic emulsion Place 1 drop into both eyes daily. 03/23/22  Yes Medina-Vargas, Monina C, NP  Wound Cleansers (VASHE WOUND THERAPY EX) Apply 1 application  topically daily. Given samples by provider   Yes [provider]  amoxicillin -clavulanate (AUGMENTIN ) 875-125 MG tablet Take 1 tablet by mouth 2 (two) times daily. Patient not taking: Reported on 10/17/2024 12/19/23   Fleeta Rothman, Jomarie SAILOR, MD  aspirin  EC 81 MG tablet Take 1 tablet (81 mg total) by mouth daily. Swallow whole. Patient not taking: Reported on 10/17/2024 09/17/23   Dameron, Marisa, DO  predniSONE   (DELTASONE ) 10 MG tablet TAKE 1 TABLET (10 MG TOTAL) BY MOUTH DAILY WITH BREAKFAST. Patient not  taking: Reported on 10/17/2024 08/08/24   Zamora, Erin R, NP  tetrahydrozoline 0.05 % ophthalmic solution Place 1 drop into both eyes 4 (four) times daily. Patient not taking: Reported on 10/17/2024    [provider]  triamcinolone  (NASACORT ) 55 MCG/ACT AERO nasal inhaler Place 1-2 sprays into the nose 2 (two) times daily as needed (for seasonal allergies). Patient not taking: Reported on 10/17/2024 03/23/22   Medina-Vargas, Jereld BROCKS, NP    Social History   Socioeconomic History   Marital status: Divorced    Spouse name: Not on file   Number of children: Not on file   Years of education: Not on file   Highest education level: Not on file  Occupational History   Not on file  Tobacco Use   Smoking status: Former    Current packs/day: 0.00    Average packs/day: 0.5 packs/day for 2.0 years (1.0 ttl pk-yrs)    Types: Cigarettes    Start date: 02/06/1990    Quit date: 02/07/1992    Years since quitting: 32.7   Smokeless tobacco: Never  Vaping Use   Vaping status: Never Used  Substance and Sexual Activity   Alcohol  use: No   Drug use: No   Sexual activity: Not on file  Other Topics Concern   Not on file  Social History Narrative   Not on file   Social Drivers of Health   Financial Resource Strain: Low Risk  (05/25/2024)   Received from [facility]   Overall Financial Resource Strain (CARDIA)    Difficulty of Paying Living Expenses: Not hard at all  Food Insecurity: No Food Insecurity (10/19/2024)   Hunger Vital Sign    Worried About Running Out of Food in the Last Year: Never true    Ran Out of Food in the Last Year: Never true  Transportation Needs: No Transportation Needs (10/19/2024)   PRAPARE - Administrator, Civil Service (Medical): No    Lack of Transportation (Non-Medical): No  Physical Activity: Unknown (05/25/2024)   Received from [facility]    Exercise Vital Sign    On average, how many days per week do you engage in moderate to strenuous exercise (like a brisk walk)?: 0 days    Minutes of Exercise per Session: Not on file  Stress: No Stress Concern Present (05/25/2024)   Received from [facility] of Occupational Health - Occupational Stress Questionnaire    Feeling of Stress : Only a little  Social Connections: Patient Declined (10/19/2024)   Social Connection and Isolation Panel    Frequency of Communication with Friends and Family: Patient declined    Frequency of Social Gatherings with Friends and Family: Patient declined    Attends Religious Services: Patient declined    Database Administrator or Organizations: Patient declined    Attends Banker Meetings: Patient declined    Marital Status: Patient declined  Intimate Partner Violence: Not At Risk (10/19/2024)   Humiliation, Afraid, Rape, and Kick questionnaire    Fear of Current or Ex-Partner: No    Emotionally Abused: No    Physically Abused: No    Sexually Abused: No    Family History  Problem Relation Age of Onset   Stroke Father     ROS: Otherwise negative unless mentioned in HPI  Physical Examination  Vitals:   10/20/24 0333 10/20/24 0808  BP: (!) 165/67 (!) 151/74  Pulse: 80 68  Resp: 17 16  Temp: 98 F (  36.7 C) (!) 97.4 F (36.3 C)  SpO2: 97% 98%   Body mass index is 27.42 kg/m.  General:  WDWN in NAD Gait: Not observed HENT: WNL, normocephalic Pulmonary: normal non-labored breathing Cardiac: regular Abdomen:  soft, NT/ND, no masses Skin: without rashes Vascular Exam/Pulses: Palpable right DP and left PT Extremities: Dressings left in place right leg; pictures from yesterday documented below Musculoskeletal: no muscle wasting or atrophy  Neurologic: A&O X 3;  No focal weakness or paresthesias are detected; speech is fluent/normal Psychiatric:  The pt has Normal affect. Lymph:   Unremarkable       CBC    Component Value Date/Time   WBC 19.4 (H) 10/20/2024 0348   RBC 3.73 (L) 10/20/2024 0348   HGB 10.6 (L) 10/20/2024 0348   HGB 12.0 02/17/2016 0920   HCT 31.8 (L) 10/20/2024 0348   HCT 37.1 02/17/2016 0920   PLT 276 10/20/2024 0348   PLT 318 02/17/2016 0920   MCV 85.3 10/20/2024 0348   MCV 88.0 02/17/2016 0920   MCH 28.4 10/20/2024 0348   MCHC 33.3 10/20/2024 0348   RDW 14.0 10/20/2024 0348   RDW 15.0 (H) 02/17/2016 0920   LYMPHSABS 0.5 (L) 10/20/2024 0348   LYMPHSABS 1.6 02/17/2016 0920   MONOABS 0.5 10/20/2024 0348   MONOABS 0.6 02/17/2016 0920   EOSABS 0.0 10/20/2024 0348   EOSABS 0.9 (H) 02/17/2016 0920   BASOSABS 0.0 10/20/2024 0348   BASOSABS 0.2 (H) 02/17/2016 0920    BMET    Component Value Date/Time   NA 138 10/20/2024 0348   NA 133 (A) 03/16/2022 0000   NA 142 02/17/2016 0920   K 3.6 10/20/2024 0348   K 4.2 02/17/2016 0920   CL 104 10/20/2024 0348   CO2 21 (L) 10/20/2024 0348   CO2 26 02/17/2016 0920   GLUCOSE 108 (H) 10/20/2024 0348   GLUCOSE 93 02/17/2016 0920   BUN 34 (H) 10/20/2024 0348   BUN 9 03/16/2022 0000   BUN 13.3 02/17/2016 0920   CREATININE 1.06 (H) 10/20/2024 0348   CREATININE 0.70 04/06/2024 1130   CREATININE 0.7 02/17/2016 0920   CALCIUM  8.2 (L) 10/20/2024 0348   CALCIUM  8.8 02/17/2016 0920   GFRNONAA 51 (L) 10/20/2024 0348   GFRAA >60 06/25/2017 1506    COAGS: Lab Results  Component Value Date   INR 1.0 10/17/2024   INR 1.0 09/07/2023   INR 1.3 (H) 02/09/2022     Non-Invasive Vascular Imaging:   Likely falsely elevated ABI  Toe pressure of 60 mmHg   ASSESSMENT/PLAN: This is a 83 y.o. female with slow to heal venous ulcerations right lower extremity  Ms. Muslima Toppins is a 83 year old female being seen in consultation for evaluation of slow to heal venous ulcerations of the right lower extremity.  She has known history of venous insufficiency and lymphedema.  She has a history of diagnostic  angiography of the right leg in October of last year which was negative for hemodynamically significant stenosis.  On exam the right leg is well-perfused with a palpable DP pulse.  No  indication for repeat angiography at this time.  Continue current wound care.  Recommend dietitian consult to help with her nutrition status.  On-call vascular surgeon Dr. Lanis also involved the evaluation and management plan of this patient today.  Vascular will not follow actively.   Donnice Sender PA-C [facility] [phone]   VASCULAR STAFF ADDENDUM: I have independently interviewed and examined the patient. I agree with the  above.  Patient's wounds are consistent with edema, likely venous insufficiency.  Studies prove that the management of the greater saphenous vein does not improve the healing of wounds, but can improve wound formation.  This is not done in the hospital but, rather the outpatient setting.  From an arterial standpoint, an angiogram was performed last year with no intervention.  She continues to have palpable pulses in the feet.  I do not think it is necessary to pursue another angiogram.  Furthermore, Evonne is not interested in any surgery. Recommend continued medical management.  Fonda FORBES Rim MD [facility] Phone Number: [phone] 10/20/2024 10:44 AM

## 2024-10-20 NOTE — Plan of Care (Signed)

## 2024-10-21 DIAGNOSIS — A419 Sepsis, unspecified organism: Secondary | ICD-10-CM | POA: Diagnosis not present

## 2024-10-21 DIAGNOSIS — Z7189 Other specified counseling: Secondary | ICD-10-CM | POA: Diagnosis not present

## 2024-10-21 DIAGNOSIS — L03115 Cellulitis of right lower limb: Secondary | ICD-10-CM | POA: Diagnosis not present

## 2024-10-21 DIAGNOSIS — Z515 Encounter for palliative care: Secondary | ICD-10-CM | POA: Diagnosis not present

## 2024-10-21 LAB — CBC WITH DIFFERENTIAL/PLATELET
Abs Immature Granulocytes: 0.06 K/uL (ref 0.00–0.07)
Basophils Absolute: 0 K/uL (ref 0.0–0.1)
Basophils Relative: 0 %
Eosinophils Absolute: 0 K/uL (ref 0.0–0.5)
Eosinophils Relative: 0 %
HCT: 29.9 % — ABNORMAL LOW (ref 36.0–46.0)
Hemoglobin: 9.7 g/dL — ABNORMAL LOW (ref 12.0–15.0)
Immature Granulocytes: 1 %
Lymphocytes Relative: 8 %
Lymphs Abs: 0.8 K/uL (ref 0.7–4.0)
MCH: 28.1 pg (ref 26.0–34.0)
MCHC: 32.4 g/dL (ref 30.0–36.0)
MCV: 86.7 fL (ref 80.0–100.0)
Monocytes Absolute: 0.7 K/uL (ref 0.1–1.0)
Monocytes Relative: 7 %
Neutro Abs: 8.7 K/uL — ABNORMAL HIGH (ref 1.7–7.7)
Neutrophils Relative %: 84 %
Platelets: 254 K/uL (ref 150–400)
RBC: 3.45 MIL/uL — ABNORMAL LOW (ref 3.87–5.11)
RDW: 14 % (ref 11.5–15.5)
WBC: 10.3 K/uL (ref 4.0–10.5)
nRBC: 0 % (ref 0.0–0.2)

## 2024-10-21 LAB — GLUCOSE, CAPILLARY: Glucose-Capillary: 143 mg/dL — ABNORMAL HIGH (ref 70–99)

## 2024-10-21 LAB — COMPREHENSIVE METABOLIC PANEL WITH GFR
ALT: 51 U/L — ABNORMAL HIGH (ref 0–44)
AST: 36 U/L (ref 15–41)
Albumin: 2.3 g/dL — ABNORMAL LOW (ref 3.5–5.0)
Alkaline Phosphatase: 64 U/L (ref 38–126)
Anion gap: 8 (ref 5–15)
BUN: 33 mg/dL — ABNORMAL HIGH (ref 8–23)
CO2: 26 mmol/L (ref 22–32)
Calcium: 8.1 mg/dL — ABNORMAL LOW (ref 8.9–10.3)
Chloride: 106 mmol/L (ref 98–111)
Creatinine, Ser: 0.96 mg/dL (ref 0.44–1.00)
GFR, Estimated: 58 mL/min — ABNORMAL LOW (ref >60)
Glucose, Bld: 93 mg/dL (ref 70–99)
Potassium: 3.6 mmol/L (ref 3.5–5.1)
Sodium: 140 mmol/L (ref 135–145)
Total Bilirubin: 0.5 mg/dL (ref 0.0–1.2)
Total Protein: 4.7 g/dL — ABNORMAL LOW (ref 6.5–8.1)

## 2024-10-21 LAB — C-REACTIVE PROTEIN: CRP: 4.3 mg/dL — ABNORMAL HIGH (ref <1.0)

## 2024-10-21 LAB — PHOSPHORUS: Phosphorus: 2.9 mg/dL (ref 2.5–4.6)

## 2024-10-21 LAB — PROCALCITONIN: Procalcitonin: 5.37 ng/mL

## 2024-10-21 LAB — MAGNESIUM: Magnesium: 2.3 mg/dL (ref 1.7–2.4)

## 2024-10-21 NOTE — Plan of Care (Signed)

## 2024-10-21 NOTE — Progress Notes (Signed)
 Isabel Barnes [id number] [facility] Liaison Note:   We recently received an Epic chat from Palliative team that daughter Mitzie would like to speak with Liaison regarding information on home hospice. [facility] Liaison did call daughter to discuss, however due to the 6hr time difference, daughter requested call back on tomorrow. [facility] Liaison to follow up early tomorrow.   Thank you for the opportunity to participate in this patient's care  Nat Babe, BSN, RN Hospice Nurse Liaison [phone]

## 2024-10-21 NOTE — Plan of Care (Signed)

## 2024-10-21 NOTE — Progress Notes (Signed)
 PROGRESS NOTE     Patient Demographics:    Isabel Barnes, is a 83 y.o. female, DOB - Jul 10, 1941, FMW:[id number]  Outpatient Primary MD for the patient is Hartwell Elvie RIGGERS    LOS - 4  Admit date - 10/17/2024    Chief Complaint  Patient presents with   Fall       Brief Narrative (HPI from H&P)   83 year old female with PMHx of rheumatoid arthritis, osteoarthritis, chronic BLE lymphedema, chronic RLE ulcer (follows w/ ortho, Dr. Harden), chronic osteomyelitis of tibia and calcaneus (follows w/ ID, Dr. Fleeta Rothman), IDA, hypothyroidism, HTN, chronic pain syndrome, remote CVA, who presented to [facility] ED on 10/17/2024 after suffering 2 falls, 1 on the day of presentation and 1 on the prior day, with generalized fatigue and weakness after receiving COVID vaccine, found down surrounded by emesis, urine, and diarrhea by caregiver.    In the ED, she was febrile to 102.4 F, tachycardic to 108, BP 150/78, SpO2 94% she was diagnosed with severe sepsis due to right lower extremity cellulitis in the setting of chronic wounds, she was revived placed on antibiotics, unfortunately she went into acute on chronic diastolic CHF with demand ischemia.  Antley being treated for NSTEMI and sepsis.   Subjective:   Patient in bed, appears comfortable, denies any headache, no fever, no chest pain or pressure, no shortness of breath , no abdominal pain. No focal weakness.   Assessment  & Plan :     # Sepsis secondary to right lower extremity cellulitis in the setting of chronic RLE ulcer - Presented with fever to 102.4 F, tachycardic to 108, leukocytosis to 24.2, lactate elevated to 4.0, in the setting of RLE cellulitis meeting SIRS criteria for  sepsis, sepsis pathophysiology has resolved, initially on stress dose steroids now on close to home dose oral steroid, on empiric antibiotics, blood cultures negative, inflammatory markers still elevated, leukocytosis had initially improved but now worsened again, her ulcer appears to be more consistent with venous stasis, ABIs suggest sufficient flow for healing, CT soft tissue does not show any abscess, seen by plastic  surgeon, VVS, ID, no surgical intervention advised.  Do not think this is pyoderma gangrenosum however will have her follow-up with dermatology outpatient, appreciate ID input and adjustment of antibiotics is requires 1 more week of oral Zyvox  and Augmentin  with end date 10/27/2024.  Daughter also wants GOC DW with Pall care- will consult.    # Chronic RLE ulcer, # Chronic osteomyelitis of calcaneus- has had multiple courses of antibiotics has been followed by ID in the outpatient setting, will consult ID as leukocytosis worse on 10/19/2024, ID/plastics/VVS also consulted on 10/19/2024.     # Chronic BLE lymphedema  - Supportive care Unna boots once better   C. difficile antigen positive.  Discussed with ID on 10/19/2024.  This is not a clinical infection.  Supportive care.    # Type I MI versus type II MI with acute on chronic diastolic CHF causing acute hypoxic respiratory failure -  Likely due to IV fluid resuscitation required for sepsis, no chest pain, EKG nonspecific changes, echo with a preserved EF of 65% and no wall motion abnormality, does have severely elevated pulmonary pressures (estimated RV systolic pressure 61.8 L MAC), severe biatrial dilation, moderate MR with severe annular calcification, moderate TR, severe aortic valve calcification with moderate stenosis, and elevated right atrial pressure.  Neurology on board, IV heparin  drip likely for 48 hours defer this to cardiology, remains chest pain-free, hypoxic respiratory failure resolved currently symptom-free on room  air.  # Symptomatic transaminitis due to shock liver from hypotension and sepsis - Asymptomatic, no abdominal pain or RUQ tenderness total bilirubin stable, trend improving.  Symptom-free.   # Rheumatoid arthritis - On chronic steroids at home, was initially on stress dose steroids tapered to oral steroid prednisone  20 mg daily on 10/19/2024   Chronic pain and narcotic use.  Minimize use, patient wants her home regimen continued at the scheduled interval, understands that she is at risk for accidental overdose.  Often gets extremely sleepy and drowsy.  Counseled.  # Hypertension - Pressure stabilized Taper down stress dose steroids to oral prednisone , she is chronically on prednisone , if blood pressure remains stable, we will add Coreg  with caution and monitor.   # Hypothyroidism  - Continue home Synthroid    # Generalized weakness and debility  # Multiple falls # Disposition- PT OT may require SNF, she lives by herself.       Condition - Extremely Guarded  Family Communication  : Daughter updated on the day of admission 10/17/2024, 10/20/24  Code Status : DNR  Consults  : Cardiology, orthopedic/plastics, ID, Pall care, VVS  PUD Prophylaxis :     Procedures  :     ABI bilateral lower extremity.  Right: Resting right ankle-brachial index indicates noncompressible right lower extremity arteries. Right toe pressure is >60 mmHg which suggests adequate perfusion for healing. Left: Resting left ankle-brachial index indicates noncompressible left lower extremity arteries. Left toe pressure is >60 mmHg which suggests adequate perfusion for healing.  CT soft tissue right lower extremity 1. Large soft tissue defect within the anteromedial proximal right lower leg consistent with known wound. Retained staples are seen along the margins of the wound, likely from prior surgical intervention and skin graft. No significant change since prior MRI. 2. Stable marked ventral cortical thickening of  the right tibial diaphysis, compatible with chronic osteomyelitis described on previous MRI. No new areas of bony destruction or periosteal reaction on this exam. 3. Diffuse dermal thickening and subcutaneous edema of the distal right lower leg  compatible with cellulitis. No evidence of fluid collection or abscess  TTE -  1. Left ventricular ejection fraction, by estimation, is 65 to 70%. The left ventricle has normal function. The left ventricle has no regional wall motion abnormalities. 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 Left ventricular diastolic parameters are indeterminate. Elevated left atrial pressure.  2. Right ventricular systolic function is normal. The right ventricular size is normal. There is severely elevated pulmonary artery systolic pressure. The estimated right ventricular systolic pressure is 61.8 mmHg.  3. Left atrial size was severely dilated.  4. Right atrial size was severely dilated.  5. The mitral valve is degenerative. Moderate mitral valve regurgitation. No evidence of mitral stenosis. Severe mitral annular calcification.  6. Tricuspid valve regurgitation is moderate to severe.  7. The aortic valve is abnormal. There is severe calcifcation of the aortic valve. Aortic valve regurgitation is not visualized. Moderate aortic valve stenosis. Aortic valve area, by VTI measures 1.50 cm. Aortic valve mean gradient measures 28.0 mmHg. Aortic valve Vmax measures 3.47 m/s.  8. The inferior vena cava is dilated in size with <50% respiratory variability, suggesting right atrial pressure of 15 mmHg  CT head and C-spine.  Nonacute.      Disposition Plan  :    Status is: Inpatient  DVT Prophylaxis  :  Heparin  gtt    Lab Results  Component Value Date   PLT 254 10/21/2024    Diet :  Diet Order             DIET SOFT Fluid consistency: Thin  Diet effective now                    Inpatient Medications  Scheduled Meds:   amoxicillin -clavulanate  1 tablet Oral BID   aspirin  EC  81 mg Oral Daily   carvedilol   3.125 mg Oral BID WC   cycloSPORINE   1 drop Both Eyes Daily   enoxaparin  (LOVENOX ) injection  40 mg Subcutaneous Q24H   HYDROcodone -acetaminophen   1 tablet Oral Q6H   hydrocortisone   25 mg Rectal BID   levothyroxine   75 mcg Oral Q0600   linezolid   600 mg Oral Q12H   ondansetron   4 mg Oral Q12H   predniSONE   20 mg Oral Q breakfast   rosuvastatin   10 mg Oral Daily   Continuous Infusions:   PRN Meds:.acetaminophen  **OR** acetaminophen , hydrOXYzine , loperamide , methocarbamol , naphazoline-glycerin , nitroGLYCERIN , [DISCONTINUED] ondansetron  **OR** ondansetron  (ZOFRAN ) IV, triamcinolone     Objective:   Vitals:   10/21/24 0054 10/21/24 0500 10/21/24 0531 10/21/24 0735  BP: (!) 145/101  (!) 153/70 (!) 120/93  Pulse: 72   60  Resp: 14  14   Temp: 98 F (36.7 C)  98.1 F (36.7 C) 98.1 F (36.7 C)  TempSrc: Oral  Oral Oral  SpO2: 98%  96% 96%  Weight:  66.6 kg    Height:        Wt Readings from Last 3 Encounters:  10/21/24 66.6 kg  12/19/23 55.3 kg  10/24/23 55.3 kg     Intake/Output Summary (Last 24 hours) at 10/21/2024 0857 Last data filed at 10/20/2024 1700 Gross per 24 hour  Intake 469.29 ml  Output --  Net 469.29 ml     Physical Exam  Awake Alert, No new F.N deficits, Normal affect [place].AT,PERRAL Supple Neck, No JVD,   Symmetrical Chest wall movement, Good air movement bilaterally, CTAB RRR,No Gallops,Rubs or new Murmurs,  +ve B.Sounds, Abd Soft, No tenderness,  No Cyanosis, Clubbing or edema     RN pressure injury documentation: Wound 10/18/24 Pressure Injury Heel Right Stage 3 -  Full thickness tissue loss. Subcutaneous fat may be visible but bone, tendon or muscle are NOT exposed. (Active)      Data Review:    Recent Labs  Lab 10/17/24 1204 10/17/24 2259 10/18/24 0443 10/19/24 0702 10/20/24 0348 10/21/24 0350  WBC 24.2* 20.1* 16.0* 22.2* 19.4* 10.3  HGB  11.6* 11.7* 10.7* 10.8* 10.6* 9.7*  HCT 36.4 37.2 33.3* 33.3* 31.8* 29.9*  PLT 308 289 225 229 276 254  MCV 88.3 88.4 87.6 87.4 85.3 86.7  MCH 28.2 27.8 28.2 28.3 28.4 28.1  MCHC 31.9 31.5 32.1 32.4 33.3 32.4  RDW 14.1 14.2 14.2 14.2 14.0 14.0  LYMPHSABS 0.4*  --  0.4* 0.6* 0.5* 0.8  MONOABS 0.8  --  0.3 0.9 0.5 0.7  EOSABS 0.1  --  0.0 0.0 0.0 0.0  BASOSABS 0.1  --  0.0 0.0 0.0 0.0    Recent Labs  Lab 10/17/24 1204 10/17/24 1220 10/17/24 1413 10/17/24 2259 10/17/24 2300 10/18/24 0443 10/19/24 0702 10/20/24 0348 10/21/24 0350  NA 137  --   --   --   --  140 138 138 140  K 3.9  --   --   --   --  3.6 3.9 3.6 3.6  CL 101  --   --   --   --  105 104 104 106  CO2 22  --   --   --   --  25 18* 21* 26  ANIONGAP 14  --   --   --   --  10 16* 13 8  GLUCOSE 129*  --   --   --   --  137* 133* 108* 93  BUN 15  --   --   --   --  16 27* 34* 33*  CREATININE 0.96  --   --  0.93  --  0.96 0.94 1.06* 0.96  AST 92*  --   --   --   --  99* 80* 54* 36  ALT 113*  --   --   --   --  82* 68* 64* 51*  ALKPHOS 138*  --   --   --   --  98 98 87 64  BILITOT 1.1  --   --   --   --  1.0 0.6 0.7 0.5  ALBUMIN  3.4*  --   --   --   --  2.4* 2.4* 2.5* 2.3*  CRP  --   --   --   --   --  17.5* 18.5* 10.3* 4.3*  PROCALCITON  --   --   --  17.40  --  21.50  --  12.50 5.37  LATICACIDVEN  --  3.5* 4.0*  --   --  1.5  --   --   --   INR 1.0  --   --   --   --   --   --   --   --   BNP  --   --   --   --  2,245.5*  --   --   --   --   MG  --   --   --   --   --  1.6* 2.5* 2.4 2.3  PHOS  --   --   --   --   --  3.1  2.9 2.9 2.9  CALCIUM  8.7*  --   --   --   --  8.1* 8.0* 8.2* 8.1*      Recent Labs  Lab 10/17/24 1204 10/17/24 1220 10/17/24 1413 10/17/24 2259 10/17/24 2300 10/18/24 0443 10/19/24 0702 10/20/24 0348 10/21/24 0350  CRP  --   --   --   --   --  17.5* 18.5* 10.3* 4.3*  PROCALCITON  --   --   --  17.40  --  21.50  --  12.50 5.37  LATICACIDVEN  --  3.5* 4.0*  --   --  1.5  --   --   --    INR 1.0  --   --   --   --   --   --   --   --   BNP  --   --   --   --  2,245.5*  --   --   --   --   MG  --   --   --   --   --  1.6* 2.5* 2.4 2.3  CALCIUM  8.7*  --   --   --   --  8.1* 8.0* 8.2* 8.1*    --------------------------------------------------------------------------------------------------------------- Lab Results  Component Value Date   CHOL 162 09/13/2023   HDL 50 09/13/2023   LDLCALC 103 (H) 09/13/2023   TRIG 45 09/13/2023   CHOLHDL 3.2 09/13/2023    No results found for: HGBA1C No results for input(s): TSH, T4TOTAL, FREET4, T3FREE, THYROIDAB in the last 72 hours. No results for input(s): VITAMINB12, FOLATE, FERRITIN, TIBC, IRON, RETICCTPCT in the last 72 hours. ------------------------------------------------------------------------------------------------------------------ Cardiac Enzymes No results for input(s): CKMB, TROPONINI, MYOGLOBIN in the last 168 hours.  Invalid input(s): CK  Micro Results Recent Results (from the past 240 hours)  Culture, blood (Routine x 2)     Status: None (Preliminary result)   Collection Time: 10/17/24 12:02 PM   Specimen: BLOOD LEFT ARM  Result Value Ref Range Status   Specimen Description BLOOD LEFT ARM  Final   Special Requests   Final    BOTTLES DRAWN AEROBIC AND ANAEROBIC Blood Culture results may not be optimal due to an inadequate volume of blood received in culture bottles   Culture   Final    NO GROWTH 3 DAYS Performed at [facility], [address], [place], [place] [address]    Report Status PENDING  Incomplete  Culture, blood (Routine x 2)     Status: None (Preliminary result)   Collection Time: 10/17/24 12:02 PM   Specimen: BLOOD RIGHT ARM  Result Value Ref Range Status   Specimen Description BLOOD RIGHT ARM  Final   Special Requests   Final    AEROBIC BOTTLE ONLY Blood Culture results may not be optimal due to an inadequate volume of blood received in culture bottles    Culture   Final    NO GROWTH 3 DAYS Performed at [facility], [address], [place], [place] [address]    Report Status PENDING  Incomplete  Resp panel by RT-PCR (RSV, Flu A&B, Covid) Anterior Nasal Swab     Status: None   Collection Time: 10/17/24 12:17 PM   Specimen: Anterior Nasal Swab  Result Value Ref Range Status   SARS Coronavirus 2 by RT PCR NEGATIVE NEGATIVE Final   Influenza A by PCR NEGATIVE NEGATIVE Final   Influenza B by PCR NEGATIVE NEGATIVE Final    Comment: (NOTE) The  Xpert Xpress SARS-CoV-2/FLU/RSV plus assay is intended as an aid in the diagnosis of influenza from Nasopharyngeal swab specimens and should not be used as a sole basis for treatment. Nasal washings and aspirates are unacceptable for Xpert Xpress SARS-CoV-2/FLU/RSV testing.  Fact Sheet for Patients: bloggercourse.com  Fact Sheet for Healthcare Providers: seriousbroker.it  This test is not yet approved or cleared by the [place]  FDA and has been authorized for detection and/or diagnosis of SARS-CoV-2 by FDA under an Emergency Use Authorization (EUA). This EUA will remain in effect (meaning this test can be used) for the duration of the COVID-19 declaration under Section 564(b)(1) of the Act, 21 U.S.C. section 360bbb-3(b)(1), unless the authorization is terminated or revoked.     Resp Syncytial Virus by PCR NEGATIVE NEGATIVE Final    Comment: (NOTE) Fact Sheet for Patients: bloggercourse.com  Fact Sheet for Healthcare Providers: seriousbroker.it  This test is not yet approved or cleared by the [place]  FDA and has been authorized for detection and/or diagnosis of SARS-CoV-2 by FDA under an Emergency Use Authorization (EUA). This EUA will remain in effect (meaning this test can be used) for the duration of the COVID-19 declaration under Section 564(b)(1) of the Act, 21  U.S.C. section 360bbb-3(b)(1), unless the authorization is terminated or revoked.  Performed at [facility], [address], [place], [place] [address]   Respiratory (~20 pathogens) panel by PCR     Status: None   Collection Time: 10/17/24 12:17 PM   Specimen: Nasopharyngeal Swab; Respiratory  Result Value Ref Range Status   Adenovirus NOT DETECTED NOT DETECTED Final   Coronavirus 229E NOT DETECTED NOT DETECTED Final    Comment: (NOTE) The Coronavirus on the Respiratory Panel, DOES NOT test for the novel  Coronavirus (2019 nCoV)    Coronavirus HKU1 NOT DETECTED NOT DETECTED Final   Coronavirus NL63 NOT DETECTED NOT DETECTED Final   Coronavirus OC43 NOT DETECTED NOT DETECTED Final   Metapneumovirus NOT DETECTED NOT DETECTED Final   Rhinovirus / Enterovirus NOT DETECTED NOT DETECTED Final   Influenza A NOT DETECTED NOT DETECTED Final   Influenza B NOT DETECTED NOT DETECTED Final   Parainfluenza Virus 1 NOT DETECTED NOT DETECTED Final   Parainfluenza Virus 2 NOT DETECTED NOT DETECTED Final   Parainfluenza Virus 3 NOT DETECTED NOT DETECTED Final   Parainfluenza Virus 4 NOT DETECTED NOT DETECTED Final   Respiratory Syncytial Virus NOT DETECTED NOT DETECTED Final   Bordetella pertussis NOT DETECTED NOT DETECTED Final   Bordetella Parapertussis NOT DETECTED NOT DETECTED Final   Chlamydophila pneumoniae NOT DETECTED NOT DETECTED Final   Mycoplasma pneumoniae NOT DETECTED NOT DETECTED Final    Comment: Performed at [facility], [address] [place], [place] [address]  C Difficile Quick Screen w PCR reflex     Status: Abnormal   Collection Time: 10/17/24  5:28 PM   Specimen: STOOL  Result Value Ref Range Status   C Diff antigen POSITIVE (A) NEGATIVE Final   C Diff toxin NEGATIVE NEGATIVE Final   C Diff interpretation Results are indeterminate. See PCR results.  Final    Comment: Performed at [facility], [address] [place], [place] [address]  C. Diff by  PCR, Reflexed     Status: None   Collection Time: 10/17/24  5:28 PM  Result Value Ref Range Status   Toxigenic C. Difficile by PCR NEGATIVE NEGATIVE Final    Comment: Patient is colonized with non toxigenic C. difficile.  May not need treatment unless significant symptoms are present.   Hypervirulent Strain PRESUMPTIVE NEGATIVE PRESUMPTIVE NEGATIVE Final    Comment: Performed at [facility], [address] [place], [place] [address]  MRSA Next Gen by PCR, Nasal     Status: None   Collection Time: 10/18/24  4:14 AM   Specimen: Nasal Mucosa; Nasal Swab  Result Value Ref Range Status   MRSA by PCR Next Gen NOT DETECTED NOT DETECTED Final    Comment: (NOTE) The GeneXpert MRSA Assay (FDA approved for NASAL specimens only), is one component of a comprehensive MRSA colonization surveillance program. It is not intended to diagnose MRSA infection nor to guide or monitor treatment for MRSA infections. Test performance is not FDA approved in patients less than 47 years old. Performed at [facility], [address], [place], [place] [address]     Radiology Report VAS US  ABI WITH/WO TBI Result Date: 10/19/2024  LOWER EXTREMITY DOPPLER STUDY Patient Name:  MARISSIA BLACKHAM  Date of Exam:   10/19/2024 Medical Rec #: [id number]          Accession #:    [id number] Date of Birth: 04-19-1941           Patient Gender: F Patient Age:   60 years Exam Location:  [facility] Procedure:      VAS US  ABI WITH/WO TBI Referring Phys: [facility] --------------------------------------------------------------------------------  Indications: Ulceration (Chronic) High Risk Factors: Hypertension, hyperlipidemia, no history of smoking.  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 Limitations: Today's exam was limited due to Bandages, skin texture, edema,              venous interference. Comparison Study: Prior ABI done 09/09/23 Performing Technologist: Rachel Pellet RVS  Examination Guidelines: A complete evaluation includes at minimum, Doppler waveform signals and systolic blood pressure reading at the level of bilateral brachial, anterior tibial, and posterior tibial arteries, when vessel segments are accessible. Bilateral testing is considered an integral part of a complete examination. Photoelectric Plethysmograph (PPG) waveforms and toe systolic pressure readings are included as required and additional duplex testing as needed. Limited examinations for reoccurring indications may be performed as noted.  ABI Findings: +---------+------------------+-----+---------+--------+ Right    Rt Pressure (mmHg)IndexWaveform Comment  +---------+------------------+-----+---------+--------+ Brachial 129                    triphasic         +---------+------------------+-----+---------+--------+ PTA      147               1.14 biphasic          +---------+------------------+-----+---------+--------+ DP       254               1.97 biphasic          +---------+------------------+-----+---------+--------+ Great Toe61                0.47 Normal            +---------+------------------+-----+---------+--------+ +---------+------------------+-----+-----------+-------+ Left     Lt Pressure (mmHg)IndexWaveform   Comment +---------+------------------+-----+-----------+-------+ Brachial 117                    triphasic          +---------+------------------+-----+-----------+-------+ PTA      254               1.97 multiphasic        +---------+------------------+-----+-----------+-------+ DP  185               1.43 multiphasic        +---------+------------------+-----+-----------+-------+ Great Toe70                0.54 Normal             +---------+------------------+-----+-----------+-------+ +-------+-----------+-----------+------------+------------+ ABI/TBIToday's ABIToday's TBIPrevious ABIPrevious TBI  +-------+-----------+-----------+------------+------------+ Right  1.97       0.47       1.37        0.25         +-------+-----------+-----------+------------+------------+ Left   1.97       0.54       1.49        0.84         +-------+-----------+-----------+------------+------------+ Arterial wall calcification precludes accurate ankle pressures and ABIs. Bilateral ABIs appear essentially unchanged compared to prior study on 09/09/23. Right TBIs appear increased. Left TBIs appear decreased.  Summary: Right: Resting right ankle-brachial index indicates noncompressible right lower extremity arteries. Right toe pressure is >60 mmHg which suggests adequate perfusion for healing. Left: Resting left ankle-brachial index indicates noncompressible left lower extremity arteries. Left toe pressure is >60 mmHg which suggests adequate perfusion for healing. *See table(s) above for measurements and observations.  Electronically signed by Fonda Rim on 10/19/2024 at 7:27:59 PM.    Final    CT TIBIA FIBULA RIGHT W WO CONTRAST Result Date: 10/19/2024 CLINICAL DATA:  Soft tissue infection suspected EXAM: CT OF THE LOWER RIGHT EXTREMITY WITHOUT CONTRAST TECHNIQUE: Multidetector CT imaging of the lower right extremity from the distal femur through the right ankle was performed following the standard protocol before and during bolus administration of intravenous contrast. RADIATION DOSE REDUCTION: This exam was performed according to the departmental dose-optimization program which includes automated exposure control, adjustment of the mA and/or kV according to patient size and/or use of iterative reconstruction technique. CONTRAST:  75mL OMNIPAQUE  IOHEXOL  350 MG/ML SOLN COMPARISON:  07/26/2024, 09/08/2023 FINDINGS: Bones/Joint/Cartilage Unremarkable right knee arthroplasty. There is stable cortical thickening along the ventral margin of the right tibial diaphysis, compatible with the chronic osteomyelitis  described on previous MRI. There is a metallic staple imbedded within the distal aspect of the cortical thickening, unchanged since prior MRI. There are no new areas of bony destruction or periosteal reaction. There are no acute displaced fractures. The bones are diffusely osteopenic. Prominent osteoarthritis of the right ankle and foot. Ligaments Suboptimally assessed by CT. Muscles and Tendons No acute findings. Soft tissues There is marked dermal thickening and subcutaneous edema involving the distal right lower leg, ankle, and visualized portions of the right foot. No evidence of fluid collection or abscess. No subcutaneous gas. There is a large soft tissue wound involving the anteromedial aspect of the proximal right lower leg, with multiple surgical staples within the subcutaneous tissues along the margins of this defect. Reconstructed images demonstrate no additional findings. IMPRESSION: 1. Large soft tissue defect within the anteromedial proximal right lower leg consistent with known wound. Retained staples are seen along the margins of the wound, likely from prior surgical intervention and skin graft. No significant change since prior MRI. 2. Stable marked ventral cortical thickening of the right tibial diaphysis, compatible with chronic osteomyelitis described on previous MRI. No new areas of bony destruction or periosteal reaction on this exam. 3. Diffuse dermal thickening and subcutaneous edema of the distal right lower leg compatible with cellulitis. No evidence of fluid collection or abscess. Electronically Signed   By: Ozell  Delores M.D.   On: 10/19/2024 18:46     Signature  -   Lavada Stank M.D on 10/21/2024 at 8:57 AM   -  To page go to [email or web address]

## 2024-10-21 NOTE — Progress Notes (Signed)
 Daily Progress Note   Patient Name: Isabel Barnes       Date: 10/21/2024 DOB: 1941/05/12  Age: 83 y.o. MRN#: [id number] Attending Physician: Dennise Lavada POUR, MD Primary Care Physician: Hartwell Area, PA-C Admit Date: 10/17/2024  Reason for Consultation/Follow-up: Establishing goals of care   Length of Stay: 4  Current Medications: Scheduled Meds:   amoxicillin -clavulanate  1 tablet Oral BID   aspirin  EC  81 mg Oral Daily   carvedilol   3.125 mg Oral BID WC   cycloSPORINE   1 drop Both Eyes Daily   enoxaparin  (LOVENOX ) injection  40 mg Subcutaneous Q24H   HYDROcodone -acetaminophen   1 tablet Oral Q6H   hydrocortisone   25 mg Rectal BID   levothyroxine   75 mcg Oral Q0600   linezolid   600 mg Oral Q12H   ondansetron   4 mg Oral Q12H   predniSONE   20 mg Oral Q breakfast   rosuvastatin   10 mg Oral Daily    Continuous Infusions:   PRN Meds: acetaminophen  **OR** acetaminophen , hydrOXYzine , loperamide , methocarbamol , naphazoline-glycerin , nitroGLYCERIN , [DISCONTINUED] ondansetron  **OR** ondansetron  (ZOFRAN ) IV, triamcinolone   Physical Exam Vitals reviewed.  Constitutional:      General: She is not in acute distress. HENT:     Head: Normocephalic and atraumatic.  Cardiovascular:     Rate and Rhythm: Normal rate.  Pulmonary:     Effort: Pulmonary effort is normal.  Musculoskeletal:     Comments: Lower leg wound- covered with bandage  Skin:    General: Skin is warm and dry.  Neurological:     Mental Status: She is alert and oriented to person, place, and time.  Psychiatric:        Mood and Affect: Mood normal.        Behavior: Behavior normal.             Vital Signs: BP (!) 120/93 (BP Location: Left Arm)   Pulse 65   Temp 98.1 F (36.7 C) (Oral)   Resp 14   Ht  5' 2 (1.575 m)   Wt 66.6 kg   SpO2 96%   BMI 26.85 kg/m  SpO2: SpO2: 96 % O2 Device: O2 Device: Room Air O2 Flow Rate: O2 Flow Rate (L/min): 2 L/min     Patient Active Problem List   Diagnosis Date Noted   Sepsis (HCC) 10/17/2024  History of revision of total knee arthroplasty 05/17/2024   Venous ulcer of right leg (HCC) 04/16/2024   Medication management 04/16/2024   Fungal infection 04/16/2024   Rash 01/15/2024   Rheumatoid arthritis (HCC) 12/19/2023   Acute osteomyelitis of right calcaneus (HCC) 12/18/2023   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   RLE Wound  R Heel Ulcer 09/08/2023   LLE Cellulitis 09/07/2023   Abscess of right lower leg    Severe protein-calorie malnutrition    Peripheral arterial disease    Fall    Atrial fibrillation (HCC)    Goals of care, counseling/discussion    Anemia    Fluid collection (edema) in the arms, legs, hands and feet    Cellulitis of right lower extremity 02/06/2022   Chest pain, rule out acute myocardial infarction 03/16/2021   Hypertension 03/16/2021   Hypothyroidism 03/16/2021   Hyperlipemia 03/16/2021   Aortic valve stenosis 03/16/2021   Chronic venous insufficiency of lower extremity 03/16/2021   Short-term memory loss 12/13/2019   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   Primary osteoarthritis of right knee 07/16/2016   Eosinophilia 06/16/2015   Chronic pain disorder    Osteoarthritis    Incisional umbilical hernia, without obstruction or gangrene    Iron deficiency anemia 09/19/2012    Palliative Care Assessment & Plan   Patient Profile: 83 y.o. female  with past medical history of rheumatoid arthritis, osteoarthritis, chronic BLE lymphedema, chronic RLE ulcer (follows with ortho- Dr. Harden), chronic osteomyelitis of tibia and calcaneus (follows with ID- Dr. Fleeta Rothman), anemia of chronic disease, hypothyroidism, HTN, CVA, and moderate aortic stenosis admitted on 10/17/2024 after falling on the day of presentation  and the day prior to presentation.    In the ED she was diagnosed with sepsis from lower extremity cellulitis and placed on antibiotics. Patient became short of breath. Troponin was elevated. Cardiology consulted. Troponin elevated presumably due to demand ischemia.   PMT consulted to establish GOC.  Today's Discussion: Met with patient. We continued our discussion around goals of care and options moving forward. Patient shares she feels old and tired she is worried about her declining quality of life and the likelihood that she will need increased care both at discharge and moving forward. Patient is not worried about dying but more worried about what her life might look like between now and when she dies. We discussed options moving forward including comfort focused care. Ideally the patient would want hospice care in her home but is worried the she does not have the resources to support this. Her family does not live locally. We discussed the option to go to SNF with rehab which would offer 24/7 care and transitioning to comfort measures if she does not progress/or worsens. Patient would like to hear more information about hospice services- [facility] liaison and TOC notified.   09:40 Spoke with patient's daughter Mitzie. Mitzie has a good understanding about the patient's chronic conditions and current hospitalization. Mitzie shared the patient has been very open about her medical wishes/ goals of care. The patient does not want to linger and would not want to artificially extend her life. We discussed that another infection will likely happen moving forward. Mitzie believes they would not want to treat any infections moving forward but would instead want to keep the patient comfortable if that should happen. We discussed the patient's current MOST form which indicates she would want antibiotics if indicated and IV fluids if indicated. Mitzie believes the patient may want to change  this-- as these can  prolong the time to end of life. Will  discuss with patient. We discussed options moving forward. Mitzie shared her frustrations with the healthcare system as there does not seem to be a good discharge option for patient. She would like to speak to [facility] regarding these options- notified TOC.   11:00 Met with the patient to discuss MOST form. Her friend Rojelio is at bedside.  The patient and family outlined their wishes for the following treatment decisions. This MOST delineates the patient's wishes once she has been discharged. Once she is discharged she would not want to be readmitted for worsening conditions-- instead she would want to shift the focus of care to comfort. Daughter is in agreement with these decisions.   Cardiopulmonary Resuscitation: Do Not Attempt Resuscitation (DNR/No CPR)  Medical Interventions: Comfort Measures: Keep clean, warm, and dry. Use medication by any route, positioning, wound care, and other measures to relieve pain and suffering. Use oxygen, suction and manual treatment of airway obstruction as needed for comfort. Do not transfer to the hospital unless comfort needs cannot be met in current location.  Antibiotics: No antibiotics (use other measures to relieve symptoms)  IV Fluids: No IV fluids (provide other measures to ensure comfort)  Feeding Tube: No feeding tube    Emotional support and therapeutic listening provided. Encouraged family to call PMT with questions or concerns. PMT will continue to support.  Recommendations/Plan: DNR DNI Patient and family considering goals of care and discharge options TOC order for disposition option questions and [facility] referral either OP Palliative or Hospice services MOST form completed for wishes after discharge Continued PMT support    Code Status:    Code Status Orders  (From admission, onward)           Start     Ordered   10/17/24 1809  Do not attempt resuscitation (DNR)- Limited -Do Not Intubate (DNI)   Continuous       Question Answer Comment  If pulseless and not breathing No CPR or chest compressions.   In Pre-Arrest Conditions (Patient Is Breathing and Has A Pulse) Do not intubate. Provide all appropriate non-invasive medical interventions. Avoid ICU transfer unless indicated or required.   Consent: Discussion documented in EHR or advanced directives reviewed      10/17/24 1809         Extensive chart review has been completed prior to seeing the patient including labs, vital signs, imaging, progress/consult notes, orders, medications, and available advance directive documents.  Care plan was discussed with bedside RN, Dr. Dennise, [facility] and [facility] Liaison  Time spent: 95 minutes  Thank you for allowing the Palliative Medicine Team to assist in the care of this patient.    Stephane CHRISTELLA Palin, NP  Please contact Palliative Medicine Team phone at [phone] for questions and concerns.

## 2024-10-22 DIAGNOSIS — L03115 Cellulitis of right lower limb: Secondary | ICD-10-CM | POA: Diagnosis not present

## 2024-10-22 DIAGNOSIS — A419 Sepsis, unspecified organism: Secondary | ICD-10-CM | POA: Diagnosis not present

## 2024-10-22 DIAGNOSIS — Z7189 Other specified counseling: Secondary | ICD-10-CM | POA: Diagnosis not present

## 2024-10-22 DIAGNOSIS — Z515 Encounter for palliative care: Secondary | ICD-10-CM | POA: Diagnosis not present

## 2024-10-22 LAB — CBC WITH DIFFERENTIAL/PLATELET
Abs Immature Granulocytes: 0.03 K/uL (ref 0.00–0.07)
Basophils Absolute: 0 K/uL (ref 0.0–0.1)
Basophils Relative: 0 %
Eosinophils Absolute: 0 K/uL (ref 0.0–0.5)
Eosinophils Relative: 1 %
HCT: 31.2 % — ABNORMAL LOW (ref 36.0–46.0)
Hemoglobin: 9.9 g/dL — ABNORMAL LOW (ref 12.0–15.0)
Immature Granulocytes: 0 %
Lymphocytes Relative: 12 %
Lymphs Abs: 0.9 K/uL (ref 0.7–4.0)
MCH: 27.7 pg (ref 26.0–34.0)
MCHC: 31.7 g/dL (ref 30.0–36.0)
MCV: 87.4 fL (ref 80.0–100.0)
Monocytes Absolute: 0.6 K/uL (ref 0.1–1.0)
Monocytes Relative: 8 %
Neutro Abs: 6.1 K/uL (ref 1.7–7.7)
Neutrophils Relative %: 79 %
Platelets: 254 K/uL (ref 150–400)
RBC: 3.57 MIL/uL — ABNORMAL LOW (ref 3.87–5.11)
RDW: 13.9 % (ref 11.5–15.5)
WBC: 7.7 K/uL (ref 4.0–10.5)
nRBC: 0 % (ref 0.0–0.2)

## 2024-10-22 LAB — CULTURE, BLOOD (ROUTINE X 2)
Culture: NO GROWTH
Culture: NO GROWTH

## 2024-10-22 LAB — COMPREHENSIVE METABOLIC PANEL WITH GFR
ALT: 44 U/L (ref 0–44)
AST: 32 U/L (ref 15–41)
Albumin: 2.3 g/dL — ABNORMAL LOW (ref 3.5–5.0)
Alkaline Phosphatase: 67 U/L (ref 38–126)
Anion gap: 4 — ABNORMAL LOW (ref 5–15)
BUN: 27 mg/dL — ABNORMAL HIGH (ref 8–23)
CO2: 30 mmol/L (ref 22–32)
Calcium: 8 mg/dL — ABNORMAL LOW (ref 8.9–10.3)
Chloride: 104 mmol/L (ref 98–111)
Creatinine, Ser: 1.04 mg/dL — ABNORMAL HIGH (ref 0.44–1.00)
GFR, Estimated: 53 mL/min — ABNORMAL LOW (ref >60)
Glucose, Bld: 88 mg/dL (ref 70–99)
Potassium: 4.4 mmol/L (ref 3.5–5.1)
Sodium: 138 mmol/L (ref 135–145)
Total Bilirubin: 0.6 mg/dL (ref 0.0–1.2)
Total Protein: 4.8 g/dL — ABNORMAL LOW (ref 6.5–8.1)

## 2024-10-22 LAB — PROCALCITONIN: Procalcitonin: 2.42 ng/mL

## 2024-10-22 MED ORDER — WITCH HAZEL-GLYCERIN EX PADS
MEDICATED_PAD | CUTANEOUS | Status: DC | PRN
Start: 2024-10-22 — End: 2024-10-23
  Administered 2024-10-22: 1 via TOPICAL
  Filled 2024-10-22: qty 100

## 2024-10-22 NOTE — Progress Notes (Signed)
 PT Cancellation Note  Patient Details Name: Isabel Barnes MRN: [id number] DOB: 1941-11-18   Cancelled Treatment:    Reason Eval/Treat Not Completed: Other (comment)  Pt declined PT due to having a busy day and is tired.  Reports she has been mobilizing today.  Will f/u later date.  Isabel Barnes, PT Acute Rehab [facility] [phone]  Isabel Barnes [place] 10/22/2024, 3:11 PM

## 2024-10-22 NOTE — Progress Notes (Signed)
 Daily Progress Note   Patient Name: Isabel Barnes       Date: 10/22/2024 DOB: Oct 15, 1941  Age: 83 y.o. MRN#: [id number] Attending Physician: Isabel Lavada POUR, MD Primary Care Physician: Isabel Area, PA-C Admit Date: 10/17/2024  Reason for Consultation/Follow-up: Establishing goals of care   Length of Stay: 5  Current Medications: Scheduled Meds:   amoxicillin -clavulanate  1 tablet Oral BID   aspirin  EC  81 mg Oral Daily   carvedilol   3.125 mg Oral BID WC   cycloSPORINE   1 drop Both Eyes Daily   enoxaparin  (LOVENOX ) injection  40 mg Subcutaneous Q24H   HYDROcodone -acetaminophen   1 tablet Oral Q6H   hydrocortisone   25 mg Rectal BID   levothyroxine   75 mcg Oral Q0600   linezolid   600 mg Oral Q12H   ondansetron   4 mg Oral Q12H   predniSONE   20 mg Oral Q breakfast   rosuvastatin   10 mg Oral Daily    Continuous Infusions:   PRN Meds: acetaminophen  **OR** acetaminophen , hydrOXYzine , loperamide , methocarbamol , naphazoline-glycerin , nitroGLYCERIN , [DISCONTINUED] ondansetron  **OR** ondansetron  (ZOFRAN ) IV, triamcinolone , witch hazel-glycerin   Physical Exam Vitals reviewed.  Constitutional:      General: She is not in acute distress. HENT:     Head: Normocephalic and atraumatic.  Cardiovascular:     Rate and Rhythm: Normal rate.  Pulmonary:     Effort: Pulmonary effort is normal.  Musculoskeletal:     Comments: Lower leg wound- covered with bandage  Skin:    General: Skin is warm and dry.  Neurological:     Mental Status: She is alert and oriented to person, place, and time.  Psychiatric:        Mood and Affect: Mood normal.        Behavior: Behavior normal.        Cognition and Memory: She exhibits impaired recent memory.     Comments: Forgetful               Vital Signs: BP (!) 146/63   Pulse 60   Temp 98.6 F (37 C) (Oral)   Resp 16   Ht 5' 2 (1.575 m)   Wt 65.7 kg   SpO2 96%   BMI 26.49 kg/m  SpO2: SpO2: 96 % O2 Device: O2 Device: Room Air O2 Flow Rate: O2 Flow Rate (L/min):  2 L/min     Patient Active Problem List   Diagnosis Date Noted   Sepsis (HCC) 10/17/2024   History of revision of total knee arthroplasty 05/17/2024   Venous ulcer of right leg (HCC) 04/16/2024   Medication management 04/16/2024   Fungal infection 04/16/2024   Rash 01/15/2024   Rheumatoid arthritis (HCC) 12/19/2023   Acute osteomyelitis of right calcaneus (HCC) 12/18/2023   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   RLE Wound  R Heel Ulcer 09/08/2023   LLE Cellulitis 09/07/2023   Abscess of right lower leg    Severe protein-calorie malnutrition    Peripheral arterial disease    Fall    Atrial fibrillation (HCC)    Goals of care, counseling/discussion    Anemia    Fluid collection (edema) in the arms, legs, hands and feet    Cellulitis of right lower extremity 02/06/2022   Chest pain, rule out acute myocardial infarction 03/16/2021   Hypertension 03/16/2021   Hypothyroidism 03/16/2021   Hyperlipemia 03/16/2021   Aortic valve stenosis 03/16/2021   Chronic venous insufficiency of lower extremity 03/16/2021   Short-term memory loss 12/13/2019   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   Primary osteoarthritis of right knee 07/16/2016   Eosinophilia 06/16/2015   Chronic pain disorder    Osteoarthritis    Incisional umbilical hernia, without obstruction or gangrene    Iron deficiency anemia 09/19/2012    Palliative Care Assessment & Plan   Patient Profile: 83 y.o. female  with past medical history of rheumatoid arthritis, osteoarthritis, chronic BLE lymphedema, chronic RLE ulcer (follows with ortho- Dr. Harden), chronic osteomyelitis of tibia and calcaneus (follows with ID- Dr. Fleeta Barnes), anemia of chronic disease, hypothyroidism, HTN, CVA,  and moderate aortic stenosis admitted on 10/17/2024 after falling on the day of presentation and the day prior to presentation.    In the ED she was diagnosed with sepsis from lower extremity cellulitis and placed on antibiotics. Patient became short of breath. Troponin was elevated. Cardiology consulted. Troponin elevated presumably due to demand ischemia.   PMT consulted to establish GOC.  Today's Discussion: Assisted patient out of bathroom. She was moving around the room with her walker. She is expecting her caregiver Isabel Barnes to visit. We continued our discussion around goals of care and options moving forward. Patient shares she has spoken with her daughter Isabel Barnes regarding options moving forward. Patient says she doesn't want to linger. She also says she does not think rehab would be the best options. She trusts her children to make the best decision for her.  Spoke to patient's daughter Isabel Barnes. The patient's children have decided the best option for her is to discharge home. They do not want to pursue hospice at this time. They are open to OP Palliative services. They are increasing the amount of private caregiver support the patient has at home. They plan to follow the MOST form completed yesterday after discharge. Notified TOC, provider, and [facility] liaison.  Emotional support and therapeutic listening provided. Encouraged family to call PMT with questions or concerns. PMT will continue to support.  Recommendations/Plan: DNR DNI Patient and family want to discharge home with OP Palliative MOST form completed (10/21/24) for wishes after discharge Peripheral PMT support    Code Status:    Code Status Orders  (From admission, onward)           Start     Ordered   10/17/24 1809  Do not attempt resuscitation (DNR)- Limited -Do Not Intubate (DNI)  Continuous  Question Answer Comment  If pulseless and not breathing No CPR or chest compressions.   In Pre-Arrest Conditions  (Patient Is Breathing and Has A Pulse) Do not intubate. Provide all appropriate non-invasive medical interventions. Avoid ICU transfer unless indicated or required.   Consent: Discussion documented in EHR or advanced directives reviewed      10/17/24 1809         Extensive chart review has been completed prior to seeing the patient including labs, vital signs, imaging, progress/consult notes, orders, medications, and available advance directive documents.  Care plan was discussed with bedside RN, Dr. Dennise, [facility] and [facility] Liaison  Time spent: 50 minutes  Thank you for allowing the Palliative Medicine Team to assist in the care of this patient.    Stephane CHRISTELLA Palin, NP  Please contact Palliative Medicine Team phone at [phone] for questions and concerns.

## 2024-10-22 NOTE — Progress Notes (Signed)
 PROGRESS NOTE     Patient Demographics:    Isabel Barnes, is a 83 y.o. female, DOB - May 08, 1941, FMW:[id number]  Outpatient Primary MD for the patient is Hartwell Elvie RIGGERS    LOS - 5  Admit date - 10/17/2024    Chief Complaint  Patient presents with   Fall       Brief Narrative (HPI from H&P)   83 year old female with PMHx of rheumatoid arthritis, osteoarthritis, chronic BLE lymphedema, chronic RLE ulcer (follows w/ ortho, Dr. Harden), chronic osteomyelitis of tibia and calcaneus (follows w/ ID, Dr. Fleeta Rothman), IDA, hypothyroidism, HTN, chronic pain syndrome, remote CVA, who presented to [facility] ED on 10/17/2024 after suffering 2 falls, 1 on the day of presentation and 1 on the prior day, with generalized fatigue and weakness after receiving COVID vaccine, found down surrounded by emesis, urine, and diarrhea by caregiver.    In the ED, she was febrile to 102.4 F, tachycardic to 108, BP 150/78, SpO2 94% she was diagnosed with severe sepsis due to right lower extremity cellulitis in the setting of chronic wounds, she was revived placed on antibiotics, unfortunately she went into acute on chronic diastolic CHF with demand ischemia.  Antley being treated for NSTEMI and sepsis.   Subjective:   Patient in bed, appears comfortable, denies any headache, no fever, no chest pain or pressure, no shortness of breath , no abdominal pain. No focal weakness.   Assessment  & Plan :     # Sepsis secondary to right lower extremity cellulitis in the setting of chronic RLE ulcer - Presented with fever to 102.4 F, tachycardic to 108, leukocytosis to 24.2, lactate elevated to 4.0, in the setting of RLE cellulitis meeting SIRS criteria for  sepsis, sepsis pathophysiology has resolved, initially on stress dose steroids now on close to home dose oral steroid, on empiric antibiotics, blood cultures negative, inflammatory markers still elevated, leukocytosis had initially improved but now worsened again, her ulcer appears to be more consistent with venous stasis, ABIs suggest sufficient flow for healing, CT soft tissue does not show any abscess, seen by plastic  surgeon, VVS, ID, no surgical intervention advised.  Do not think this is pyoderma gangrenosum however will have her follow-up with dermatology outpatient, appreciate ID input and adjustment of antibiotics is requires 1 more week of oral Zyvox  and Augmentin  with end date 10/27/2024.  On daughter's request palliative care involved, palliative care had long discussion with patient's daughter and patient it is decided that she will remain DNR, they wish to have a MOST form and avoid future hospitalizations, if she gets sick again in the future they would like to focus on comfort, I confirmed this with patient myself on 10/22/2024.    # Chronic RLE ulcer, # Chronic osteomyelitis of calcaneus- has had multiple courses of antibiotics has been followed by ID in the outpatient setting, will consult ID as leukocytosis worse on 10/19/2024, ID/plastics/VVS also consulted on 10/19/2024.     # Chronic BLE lymphedema  - Supportive care Unna boots once better   C. difficile antigen positive.  Discussed with ID on 10/19/2024.  This is not a clinical infection.  Supportive care.    # Type I MI versus type II MI with acute on chronic diastolic CHF causing acute hypoxic respiratory failure -  Likely due to IV fluid resuscitation required for sepsis, no chest pain, EKG nonspecific changes, echo with a preserved EF of 65% and no wall motion abnormality, does have severely elevated pulmonary pressures (estimated RV systolic pressure 61.8 L MAC), severe biatrial dilation, moderate MR with severe annular  calcification, moderate TR, severe aortic valve calcification with moderate stenosis, and elevated right atrial pressure.  Neurology on board, IV heparin  drip likely for 48 hours defer this to cardiology, remains chest pain-free, hypoxic respiratory failure resolved currently symptom-free on room air.  # Symptomatic transaminitis due to shock liver from hypotension and sepsis - Asymptomatic, no abdominal pain or RUQ tenderness total bilirubin stable, trend improving.  Symptom-free.   # Rheumatoid arthritis - On chronic steroids at home, was initially on stress dose steroids tapered to oral steroid prednisone  20 mg daily on 10/19/2024   Chronic pain and narcotic use.  Minimize use, patient wants her home regimen continued at the scheduled interval, understands that she is at risk for accidental overdose.  Often gets extremely sleepy and drowsy.  Counseled.  # Hypertension - Pressure stabilized Taper down stress dose steroids to oral prednisone , she is chronically on prednisone , if blood pressure remains stable, we will add Coreg  with caution and monitor.   # Hypothyroidism  - Continue home Synthroid    # Generalized weakness and debility  # Multiple falls # Disposition- PT OT may require SNF, she lives by herself.       Condition - Extremely Guarded  Family Communication  : Daughter updated on the day of admission 10/17/2024, 10/20/24  Code Status : DNR  Consults  : Cardiology, orthopedic/plastics, ID, Pall care, VVS  PUD Prophylaxis :     Procedures  :     ABI bilateral lower extremity.  Right: Resting right ankle-brachial index indicates noncompressible right lower extremity arteries. Right toe pressure is >60 mmHg which suggests adequate perfusion for healing. Left: Resting left ankle-brachial index indicates noncompressible left lower extremity arteries. Left toe pressure is >60 mmHg which suggests adequate perfusion for healing.  CT soft tissue right lower extremity 1. Large  soft tissue defect within the anteromedial proximal right lower leg consistent with known wound. Retained staples are seen along the margins of the wound, likely from prior surgical intervention and skin graft. No significant  change since prior MRI. 2. Stable marked ventral cortical thickening of the right tibial diaphysis, compatible with chronic osteomyelitis described on previous MRI. No new areas of bony destruction or periosteal reaction on this exam. 3. Diffuse dermal thickening and subcutaneous edema of the distal right lower leg compatible with cellulitis. No evidence of fluid collection or abscess  TTE -  1. Left ventricular ejection fraction, by estimation, is 65 to 70%. The left ventricle has normal function. The left ventricle has no regional wall motion abnormalities. 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 Left ventricular diastolic parameters are indeterminate. Elevated left atrial pressure.  2. Right ventricular systolic function is normal. The right ventricular size is normal. There is severely elevated pulmonary artery systolic pressure. The estimated right ventricular systolic pressure is 61.8 mmHg.  3. Left atrial size was severely dilated.  4. Right atrial size was severely dilated.  5. The mitral valve is degenerative. Moderate mitral valve regurgitation. No evidence of mitral stenosis. Severe mitral annular calcification.  6. Tricuspid valve regurgitation is moderate to severe.  7. The aortic valve is abnormal. There is severe calcifcation of the aortic valve. Aortic valve regurgitation is not visualized. Moderate aortic valve stenosis. Aortic valve area, by VTI measures 1.50 cm. Aortic valve mean gradient measures 28.0 mmHg. Aortic valve Vmax measures 3.47 m/s.  8. The inferior vena cava is dilated in size with <50% respiratory variability, suggesting right atrial pressure of 15 mmHg  CT head and C-spine.  Nonacute.      Disposition Plan   :    Status is: Inpatient  DVT Prophylaxis  :  Heparin  gtt    Lab Results  Component Value Date   PLT 254 10/22/2024    Diet :  Diet Order             DIET SOFT Fluid consistency: Thin  Diet effective now                    Inpatient Medications  Scheduled Meds:  amoxicillin -clavulanate  1 tablet Oral BID   aspirin  EC  81 mg Oral Daily   carvedilol   3.125 mg Oral BID WC   cycloSPORINE   1 drop Both Eyes Daily   enoxaparin  (LOVENOX ) injection  40 mg Subcutaneous Q24H   HYDROcodone -acetaminophen   1 tablet Oral Q6H   hydrocortisone   25 mg Rectal BID   levothyroxine   75 mcg Oral Q0600   linezolid   600 mg Oral Q12H   ondansetron   4 mg Oral Q12H   predniSONE   20 mg Oral Q breakfast   rosuvastatin   10 mg Oral Daily   Continuous Infusions:   PRN Meds:.acetaminophen  **OR** acetaminophen , hydrOXYzine , loperamide , methocarbamol , naphazoline-glycerin , nitroGLYCERIN , [DISCONTINUED] ondansetron  **OR** ondansetron  (ZOFRAN ) IV, triamcinolone , witch hazel-glycerin     Objective:   Vitals:   10/22/24 0000 10/22/24 0400 10/22/24 0500 10/22/24 0836  BP: (!) 124/53 (!) 144/50  (!) 146/63  Pulse:  76  60  Resp: 13 (!) 22  16  Temp: 98.8 F (37.1 C) 98.8 F (37.1 C)  98.6 F (37 C)  TempSrc: Oral Oral  Oral  SpO2: 96%     Weight:   65.7 kg   Height:        Wt Readings from Last 3 Encounters:  10/22/24 65.7 kg  12/19/23 55.3 kg  10/24/23 55.3 kg     Intake/Output Summary (Last 24 hours) at 10/22/2024 0857 Last data filed at 10/21/2024 1700 Gross per 24 hour  Intake 720  ml  Output --  Net 720 ml     Physical Exam  Awake Alert, No new F.N deficits, Normal affect [place].AT,PERRAL Supple Neck, No JVD,   Symmetrical Chest wall movement, Good air movement bilaterally, CTAB RRR,No Gallops,Rubs or new Murmurs,  +ve B.Sounds, Abd Soft, No tenderness,   No Cyanosis, Clubbing or edema     RN pressure injury documentation: Wound 10/18/24 Pressure Injury Heel Right  Stage 3 -  Full thickness tissue loss. Subcutaneous fat may be visible but bone, tendon or muscle are NOT exposed. (Active)      Data Review:    Recent Labs  Lab 10/18/24 0443 10/19/24 0702 10/20/24 0348 10/21/24 0350 10/22/24 0342  WBC 16.0* 22.2* 19.4* 10.3 7.7  HGB 10.7* 10.8* 10.6* 9.7* 9.9*  HCT 33.3* 33.3* 31.8* 29.9* 31.2*  PLT 225 229 276 254 254  MCV 87.6 87.4 85.3 86.7 87.4  MCH 28.2 28.3 28.4 28.1 27.7  MCHC 32.1 32.4 33.3 32.4 31.7  RDW 14.2 14.2 14.0 14.0 13.9  LYMPHSABS 0.4* 0.6* 0.5* 0.8 0.9  MONOABS 0.3 0.9 0.5 0.7 0.6  EOSABS 0.0 0.0 0.0 0.0 0.0  BASOSABS 0.0 0.0 0.0 0.0 0.0    Recent Labs  Lab 10/17/24 1204 10/17/24 1220 10/17/24 1413 10/17/24 2259 10/17/24 2300 10/18/24 0443 10/19/24 0702 10/20/24 0348 10/21/24 0350 10/22/24 0342  NA 137  --   --   --   --  140 138 138 140 138  K 3.9  --   --   --   --  3.6 3.9 3.6 3.6 4.4  CL 101  --   --   --   --  105 104 104 106 104  CO2 22  --   --   --   --  25 18* 21* 26 30  ANIONGAP 14  --   --   --   --  10 16* 13 8 4*  GLUCOSE 129*  --   --   --   --  137* 133* 108* 93 88  BUN 15  --   --   --   --  16 27* 34* 33* 27*  CREATININE 0.96  --   --  0.93  --  0.96 0.94 1.06* 0.96 1.04*  AST 92*  --   --   --   --  99* 80* 54* 36 32  ALT 113*  --   --   --   --  82* 68* 64* 51* 44  ALKPHOS 138*  --   --   --   --  98 98 87 64 67  BILITOT 1.1  --   --   --   --  1.0 0.6 0.7 0.5 0.6  ALBUMIN  3.4*  --   --   --   --  2.4* 2.4* 2.5* 2.3* 2.3*  CRP  --   --   --   --   --  17.5* 18.5* 10.3* 4.3*  --   PROCALCITON  --   --   --  17.40  --  21.50  --  12.50 5.37 2.42  LATICACIDVEN  --  3.5* 4.0*  --   --  1.5  --   --   --   --   INR 1.0  --   --   --   --   --   --   --   --   --   BNP  --   --   --   --  2,245.5*  --   --   --   --   --   MG  --   --   --   --   --  1.6* 2.5* 2.4 2.3  --   PHOS  --   --   --   --   --  3.1 2.9 2.9 2.9  --   CALCIUM  8.7*  --   --   --   --  8.1* 8.0* 8.2* 8.1* 8.0*       Recent Labs  Lab 10/17/24 1204 10/17/24 1220 10/17/24 1413 10/17/24 2259 10/17/24 2300 10/18/24 0443 10/19/24 0702 10/20/24 0348 10/21/24 0350 10/22/24 0342  CRP  --   --   --   --   --  17.5* 18.5* 10.3* 4.3*  --   PROCALCITON  --   --   --  17.40  --  21.50  --  12.50 5.37 2.42  LATICACIDVEN  --  3.5* 4.0*  --   --  1.5  --   --   --   --   INR 1.0  --   --   --   --   --   --   --   --   --   BNP  --   --   --   --  2,245.5*  --   --   --   --   --   MG  --   --   --   --   --  1.6* 2.5* 2.4 2.3  --   CALCIUM  8.7*  --   --   --   --  8.1* 8.0* 8.2* 8.1* 8.0*    --------------------------------------------------------------------------------------------------------------- Lab Results  Component Value Date   CHOL 162 09/13/2023   HDL 50 09/13/2023   LDLCALC 103 (H) 09/13/2023   TRIG 45 09/13/2023   CHOLHDL 3.2 09/13/2023    No results found for: HGBA1C No results for input(s): TSH, T4TOTAL, FREET4, T3FREE, THYROIDAB in the last 72 hours. No results for input(s): VITAMINB12, FOLATE, FERRITIN, TIBC, IRON, RETICCTPCT in the last 72 hours. ------------------------------------------------------------------------------------------------------------------ Cardiac Enzymes No results for input(s): CKMB, TROPONINI, MYOGLOBIN in the last 168 hours.  Invalid input(s): CK  Micro Results Recent Results (from the past 240 hours)  Culture, blood (Routine x 2)     Status: None   Collection Time: 10/17/24 12:02 PM   Specimen: BLOOD LEFT ARM  Result Value Ref Range Status   Specimen Description BLOOD LEFT ARM  Final   Special Requests   Final    BOTTLES DRAWN AEROBIC AND ANAEROBIC Blood Culture results may not be optimal due to an inadequate volume of blood received in culture bottles   Culture   Final    NO GROWTH 5 DAYS Performed at [facility], [address] [place], [place] [address]    Report Status 10/22/2024 FINAL  Final  Culture,  blood (Routine x 2)     Status: None   Collection Time: 10/17/24 12:02 PM   Specimen: BLOOD RIGHT ARM  Result Value Ref Range Status   Specimen Description BLOOD RIGHT ARM  Final   Special Requests   Final    AEROBIC BOTTLE ONLY Blood Culture results may not be optimal due to an inadequate volume of blood received in culture bottles   Culture   Final    NO GROWTH 5 DAYS Performed at [facility], [address] [place], [place] [address]  Report Status 10/22/2024 FINAL  Final  Resp panel by RT-PCR (RSV, Flu A&B, Covid) Anterior Nasal Swab     Status: None   Collection Time: 10/17/24 12:17 PM   Specimen: Anterior Nasal Swab  Result Value Ref Range Status   SARS Coronavirus 2 by RT PCR NEGATIVE NEGATIVE Final   Influenza A by PCR NEGATIVE NEGATIVE Final   Influenza B by PCR NEGATIVE NEGATIVE Final    Comment: (NOTE) The Xpert Xpress SARS-CoV-2/FLU/RSV plus assay is intended as an aid in the diagnosis of influenza from Nasopharyngeal swab specimens and should not be used as a sole basis for treatment. Nasal washings and aspirates are unacceptable for Xpert Xpress SARS-CoV-2/FLU/RSV testing.  Fact Sheet for Patients: bloggercourse.com  Fact Sheet for Healthcare Providers: seriousbroker.it  This test is not yet approved or cleared by the [place]  FDA and has been authorized for detection and/or diagnosis of SARS-CoV-2 by FDA under an Emergency Use Authorization (EUA). This EUA will remain in effect (meaning this test can be used) for the duration of the COVID-19 declaration under Section 564(b)(1) of the Act, 21 U.S.C. section 360bbb-3(b)(1), unless the authorization is terminated or revoked.     Resp Syncytial Virus by PCR NEGATIVE NEGATIVE Final    Comment: (NOTE) Fact Sheet for Patients: bloggercourse.com  Fact Sheet for Healthcare  Providers: seriousbroker.it  This test is not yet approved or cleared by the [place]  FDA and has been authorized for detection and/or diagnosis of SARS-CoV-2 by FDA under an Emergency Use Authorization (EUA). This EUA will remain in effect (meaning this test can be used) for the duration of the COVID-19 declaration under Section 564(b)(1) of the Act, 21 U.S.C. section 360bbb-3(b)(1), unless the authorization is terminated or revoked.  Performed at [facility], [address] [place], [place] [address]   Respiratory (~20 pathogens) panel by PCR     Status: None   Collection Time: 10/17/24 12:17 PM   Specimen: Nasopharyngeal Swab; Respiratory  Result Value Ref Range Status   Adenovirus NOT DETECTED NOT DETECTED Final   Coronavirus 229E NOT DETECTED NOT DETECTED Final    Comment: (NOTE) The Coronavirus on the Respiratory Panel, DOES NOT test for the novel  Coronavirus (2019 nCoV)    Coronavirus HKU1 NOT DETECTED NOT DETECTED Final   Coronavirus NL63 NOT DETECTED NOT DETECTED Final   Coronavirus OC43 NOT DETECTED NOT DETECTED Final   Metapneumovirus NOT DETECTED NOT DETECTED Final   Rhinovirus / Enterovirus NOT DETECTED NOT DETECTED Final   Influenza A NOT DETECTED NOT DETECTED Final   Influenza B NOT DETECTED NOT DETECTED Final   Parainfluenza Virus 1 NOT DETECTED NOT DETECTED Final   Parainfluenza Virus 2 NOT DETECTED NOT DETECTED Final   Parainfluenza Virus 3 NOT DETECTED NOT DETECTED Final   Parainfluenza Virus 4 NOT DETECTED NOT DETECTED Final   Respiratory Syncytial Virus NOT DETECTED NOT DETECTED Final   Bordetella pertussis NOT DETECTED NOT DETECTED Final   Bordetella Parapertussis NOT DETECTED NOT DETECTED Final   Chlamydophila pneumoniae NOT DETECTED NOT DETECTED Final   Mycoplasma pneumoniae NOT DETECTED NOT DETECTED Final    Comment: Performed at [facility], [address] [place], [place] [address]  C Difficile Quick  Screen w PCR reflex     Status: Abnormal   Collection Time: 10/17/24  5:28 PM   Specimen: STOOL  Result Value Ref Range Status   C Diff antigen POSITIVE (A) NEGATIVE Final   C Diff toxin NEGATIVE NEGATIVE Final  C Diff interpretation Results are indeterminate. See PCR results.  Final    Comment: Performed at [facility], [address], [place], [place] [address]  C. Diff by PCR, Reflexed     Status: None   Collection Time: 10/17/24  5:28 PM  Result Value Ref Range Status   Toxigenic C. Difficile by PCR NEGATIVE NEGATIVE Final    Comment: Patient is colonized with non toxigenic C. difficile. May not need treatment unless significant symptoms are present.   Hypervirulent Strain PRESUMPTIVE NEGATIVE PRESUMPTIVE NEGATIVE Final    Comment: Performed at [facility], [address] [place], [place] [address]  MRSA Next Gen by PCR, Nasal     Status: None   Collection Time: 10/18/24  4:14 AM   Specimen: Nasal Mucosa; Nasal Swab  Result Value Ref Range Status   MRSA by PCR Next Gen NOT DETECTED NOT DETECTED Final    Comment: (NOTE) The GeneXpert MRSA Assay (FDA approved for NASAL specimens only), is one component of a comprehensive MRSA colonization surveillance program. It is not intended to diagnose MRSA infection nor to guide or monitor treatment for MRSA infections. Test performance is not FDA approved in patients less than 86 years old. Performed at [facility], [address], [place], [place] [address]     Radiology Report No results found.    Signature  -   Lavada Stank M.D on 10/22/2024 at 8:57 AM   -  To page go to [email or web address]

## 2024-10-22 NOTE — Plan of Care (Signed)
   Problem: Education: Goal: Knowledge of General Education information will improve Description Including pain rating scale, medication(s)/side effects and non-pharmacologic comfort measures Outcome: Progressing

## 2024-10-22 NOTE — TOC Progression Note (Signed)
 Transition of Care [facility]) - Progression Note    Patient Details  Name: Isabel Barnes MRN: [id number] Date of Birth: 1941/01/09  Transition of Care [facility]) CM/SW Contact  Landry DELENA Senters, RN Phone Number: 10/22/2024, 2:19 PM  Clinical Narrative:     Patient and daughter have decided to go home with palliative outpatient care. They have chosen to follow with [facility]. Consult sent to [facility] with [facility].  CM will continue to follow.   Expected Discharge Plan: Skilled Nursing Facility Barriers to Discharge: Insurance Authorization, No SNF bed, Continued Medical Work up               Expected Discharge Plan and Services In-house Referral: Clinical Social Work   Post Acute Care Choice: NA Living arrangements for the past 2 months: Single Family Home                                       Social Drivers of Health (SDOH) Interventions SDOH Screenings   Food Insecurity: No Food Insecurity (10/19/2024)  Housing: Low Risk  (10/19/2024)  Transportation Needs: No Transportation Needs (10/19/2024)  Utilities: Not At Risk (10/19/2024)  Depression (PHQ2-9): Low Risk  (12/28/2023)  Financial Resource Strain: Low Risk  (05/25/2024)   Received from [facility]  Physical Activity: Unknown (05/25/2024)   Received from [facility]  Social Connections: Patient Declined (10/19/2024)  Stress: No Stress Concern Present (05/25/2024)   Received from [facility]  Tobacco Use: Medium Risk (10/17/2024)    Readmission Risk Interventions    09/16/2023    1:44 PM 09/09/2023    3:51 PM  Readmission Risk Prevention Plan  Post Dischage Appt  Complete  Medication Screening  Complete  Transportation Screening Complete Complete  PCP or Specialist Appt within 5-7 Days Complete   Home Care Screening Complete   Medication Review (RN CM) Complete

## 2024-10-22 NOTE — Plan of Care (Signed)

## 2024-10-23 ENCOUNTER — Other Ambulatory Visit (HOSPITAL_COMMUNITY): Payer: Self-pay

## 2024-10-23 DIAGNOSIS — A419 Sepsis, unspecified organism: Secondary | ICD-10-CM | POA: Diagnosis not present

## 2024-10-23 MED ORDER — ROSUVASTATIN CALCIUM 10 MG PO TABS
10.0000 mg | ORAL_TABLET | Freq: Every day | ORAL | 0 refills | Status: AC
  Filled 2024-10-23: qty 30, 30d supply, fill #0

## 2024-10-23 MED ORDER — AMOXICILLIN-POT CLAVULANATE 500-125 MG PO TABS
1.0000 | ORAL_TABLET | Freq: Two times a day (BID) | ORAL | 0 refills | Status: AC
  Filled 2024-10-23: qty 6, 3d supply, fill #0

## 2024-10-23 MED ORDER — LINEZOLID 600 MG PO TABS
600.0000 mg | ORAL_TABLET | Freq: Two times a day (BID) | ORAL | 0 refills | Status: AC
  Filled 2024-10-23: qty 6, 3d supply, fill #0

## 2024-10-23 MED ORDER — CARVEDILOL 3.125 MG PO TABS
3.1250 mg | ORAL_TABLET | Freq: Two times a day (BID) | ORAL | 0 refills | Status: AC
  Filled 2024-10-23: qty 60, 30d supply, fill #0

## 2024-10-23 NOTE — Progress Notes (Signed)
 Patient discharged, Important Message Letter will be mailed to the patient.

## 2024-10-23 NOTE — Progress Notes (Signed)
 DISCHARGE NOTE HOME Isabel Barnes to be discharged Home per MD order. Discussed prescriptions and follow up appointments with the patient. Prescriptions given to patient; medication list explained in detail. Patient verbalized understanding.  Skin clean, dry and intact without evidence of skin break down, no evidence of skin tears noted. IV catheter discontinued intact. Site without signs and symptoms of complications. Dressing and pressure applied. Pt denies pain at the site currently. No complaints noted.  Patient free of lines, drains, and wounds.   An After Visit Summary (AVS) was printed and given to the patient.  Floor will finish discharge after Case manager has ride arranged.  [name], RN

## 2024-10-23 NOTE — Progress Notes (Signed)
 Occupational Therapy Treatment Patient Details Name: Isabel Barnes MRN: [id number] DOB: [birth year] Today's Date: 10/23/2024   History of present illness Pt is an 83 y.o. female who presented 10/17/24 with generalized weakness, x2 falls, low-grade fevers, and worsening pain in her R leg with swelling and redness. Admitted with R lower extremity cellulitis and sepsis. Pt became acutely SOB 11/19 in ED and found to be in respiratory distress with troponin 4200, NSTEMI. 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  Subjectively feeling fatigued this am.  Able to mobilize in the room with Min hand held assist.  Stand/sit grooming with setup and supervision.  Patient will be returning home with increased PCA assist, and HH OT/PT.  OT will continue efforts in the acute setting to address deficits.        If plan is discharge home, recommend the following:  Assist for transportation;A little help with walking and/or transfers;A little help with bathing/dressing/bathroom   Equipment Recommendations  None recommended by OT    Recommendations for Other Services      Precautions / Restrictions Precautions Precautions: Fall Restrictions Weight Bearing Restrictions Per Provider Order: No       Mobility Bed Mobility               General bed mobility comments: Up in recliner    Transfers Overall transfer level: Needs assistance   Transfers: Sit to/from Stand Sit to Stand: Contact guard assist, Supervision                 Balance Overall balance assessment: Needs assistance Sitting-balance support: Feet supported Sitting balance-Leahy Scale: Good     Standing balance support: Single extremity supported Standing balance-Leahy Scale: Poor                             ADL  either performed or assessed with clinical judgement   ADL       Grooming: Supervision/safety;Sitting;Standing                   Toilet Transfer: Minimal assistance                  Extremity/Trunk Assessment              Vision Patient Visual Report: No change from baseline     Perception Perception Perception: Not tested   Praxis Praxis Praxis: Not tested   Communication Communication Communication: No apparent difficulties   Cognition Arousal: Alert Behavior During Therapy: WFL for tasks assessed/performed Cognition: No apparent impairments                               Following commands: Intact        Cueing   Cueing Techniques: Verbal cues, Tactile cues                     Pertinent Vitals/ Pain       Pain Assessment Faces Pain Scale: Hurts a little bit Pain Location: B shoulder Pain Descriptors / Indicators: Aching Pain Intervention(s): Monitored during session  Frequency  Min 2X/week        Progress Toward Goals  OT Goals(current goals can now be found in the care plan section)  Progress towards OT goals: Progressing toward goals  Acute Rehab OT Goals OT Goal Formulation: With patient Time For Goal Achievement: 11/02/24 Potential to Achieve Goals: Good  Plan      Co-evaluation                 AM-PAC OT 6 Clicks Daily Activity     Outcome Measure   Help from another person eating meals?: None Help from another person taking care of personal grooming?: A Little Help from another person toileting, which includes using toliet, bedpan, or urinal?: A Little Help from another person bathing (including washing, rinsing, drying)?: A Little Help from another person to put on and taking off regular upper body clothing?: A Little Help from another person to put on and taking off regular lower body clothing?: A  Little 6 Click Score: 19    End of Session    OT Visit Diagnosis: Unsteadiness on feet (R26.81);Muscle weakness (generalized) (M62.81);Pain Pain - Right/Left: Right Pain - part of body: Shoulder   Activity Tolerance Patient tolerated treatment well   Patient Left in chair;with call bell/phone within reach   Nurse Communication Mobility status        Time: 9063-9049 OT Time Calculation (min): 14 min  Charges: OT General Charges $OT Visit: 1 Visit OT Treatments $Self Care/Home Management : 8-22 mins  10/23/2024  RP, OTR/L  Acute Rehabilitation Services  Office:  [phone]   Isabel Barnes 10/23/2024, 9:55 AM

## 2024-10-23 NOTE — Discharge Instructions (Signed)
 Follow with Primary MD Hedgecock, Elvie, PA-C in 7 days   Get CBC, CMP, Magnesium , 2 view Chest X ray -  checked next visit with your primary MD    Activity: As tolerated with Full fall precautions use walker/cane & assistance as needed  Disposition Home    Diet: Heart Healthy soft consistency easy to swallow.  Special Instructions: If you have smoked or chewed Tobacco  in the last 2 yrs please stop smoking, stop any regular Alcohol   and or any Recreational drug use.  On your next visit with your primary care physician please Get Medicines reviewed and adjusted.  Please request your Prim.MD to go over all Hospital Tests and Procedure/Radiological results at the follow up, please get all Hospital records sent to your Prim MD by signing hospital release before you go home.  If you experience worsening of your admission symptoms, develop shortness of breath, life threatening emergency, suicidal or homicidal thoughts you must seek medical attention immediately by calling 911 or calling your MD immediately  if symptoms less severe.  You Must read complete instructions/literature along with all the possible adverse reactions/side effects for all the Medicines you take and that have been prescribed to you. Take any new Medicines after you have completely understood and accpet all the possible adverse reactions/side effects.   Do not drive when taking Pain medications.  Do not take more than prescribed Pain, Sleep and Anxiety Medications  Wear Seat belts while driving.

## 2024-10-23 NOTE — Plan of Care (Signed)
 ?  Problem: Education: ?Goal: Knowledge of General Education information will improve ?Description: Including pain rating scale, medication(s)/side effects and non-pharmacologic comfort measures ?Outcome: Progressing ?  ?Problem: Health Behavior/Discharge Planning: ?Goal: Ability to manage health-related needs will improve ?Outcome: Progressing ?  ?Problem: Clinical Measurements: ?Goal: Ability to maintain clinical measurements within normal limits will improve ?Outcome: Progressing ?Goal: Diagnostic test results will improve ?Outcome: Progressing ?  ?Problem: Activity: ?Goal: Risk for activity intolerance will decrease ?Outcome: Progressing ?  ?Problem: Nutrition: ?Goal: Adequate nutrition will be maintained ?Outcome: Progressing ?  ?

## 2024-10-23 NOTE — Discharge Summary (Signed)
 Discharge summary note.  Isabel Barnes FMW:[id number] DOB: Feb 11, 1941 DOA: 10/17/2024  PCP: Hartwell Area, PA-C  Admit date: 10/17/2024  Discharge date: 10/23/2024  Admitted From: Home   Disposition:  Home   Recommendations for Outpatient Follow-up:   Follow up with PCP in 1-2 weeks  PCP Please obtain BMP/CBC, 2 view CXR in 1week,  (see Discharge instructions)   PCP Please follow up on the following pending results:    Home Health: PT, RN, Aide, SW if qualifies   Equipment/Devices: None  Consultations: ID, Pall care, VVS, Cards, Plastics Discharge Condition: Stable    CODE STATUS: Full    Diet Recommendation: Heart Healthy     Chief Complaint  Patient presents with   Fall     Brief history of present illness from the day of admission and additional interim summary    83 year old female with PMHx of rheumatoid arthritis, osteoarthritis, chronic BLE lymphedema, chronic RLE ulcer (follows w/ ortho, Dr. Harden), chronic osteomyelitis of tibia and calcaneus (follows w/ ID, Dr. Fleeta Rothman), IDA, hypothyroidism, HTN, chronic pain syndrome, remote CVA, who presented to [facility] ED on 10/17/2024 after suffering 2 falls, 1 on the day of presentation and 1 on the prior day, with generalized fatigue and weakness after receiving COVID vaccine, found down surrounded by emesis, urine, and diarrhea by caregiver.     In the ED, she was febrile to 102.4 F, tachycardic to 108, BP 150/78, SpO2 94% she was diagnosed with severe sepsis due to right lower extremity cellulitis in the setting of chronic wounds, she was revived placed on antibiotics, unfortunately she went into acute on chronic diastolic CHF with demand ischemia.  Antley being treated for NSTEMI and sepsis.                                                                  Hospital Course   # Sepsis secondary to right lower extremity cellulitis in the setting of chronic RLE ulcer - Presented with fever to 102.4 F, tachycardic to 108, leukocytosis to 24.2, lactate elevated to 4.0, in the setting of RLE cellulitis meeting SIRS criteria for sepsis, sepsis pathophysiology has resolved, initially on stress dose steroids now on close to home dose oral steroid, on empiric antibiotics, blood cultures negative, inflammatory markers still elevated, leukocytosis had initially improved but now worsened again, her ulcer appears to be more consistent with venous stasis, ABIs suggest sufficient flow for healing, CT soft tissue does not show any abscess, seen by plastic surgeon, VVS, ID, no surgical intervention advised.  Do not think this is pyoderma gangrenosum however will have her follow-up with dermatology outpatient if needed via PCP, appreciate ID input and adjustment of antibiotics is requires 1 more week of oral Zyvox  and Augmentin  with end date 10/27/2024.  Upon daughter's  request palliative care was involved, family wishes for patient to be discharged home with ongoing wound care and medical treatment, follow-up with PCP.  She is DNR.  They have signed a MOST form as well.     # Chronic RLE ulcer, # Chronic osteomyelitis of calcaneus- has had multiple courses of antibiotics has been followed by ID in the outpatient setting, seen by ID recommended 1 week of oral Zyvox  and Augmentin  on which she will be discharged with outpatient follow-up with her ID physician Dr. Lindia, ID/plastics/VVS also consulted on 10/19/2024.  Nothing further to add except wound care and antibiotics.      # Chronic BLE lymphedema  - Supportive care Unna boots once better   C. difficile antigen positive.  Discussed with ID on 10/19/2024.  This is not a clinical infection.  Supportive care.     # Type I MI versus type II MI with acute on chronic diastolic CHF causing acute hypoxic respiratory  failure -  Likely due to IV fluid resuscitation required for sepsis, no chest pain, EKG nonspecific changes, echo with a preserved EF of 65% and no wall motion abnormality, does have severely elevated pulmonary pressures (estimated RV systolic pressure 61.8 L MAC), severe biatrial dilation, moderate MR with severe annular calcification, moderate TR, severe aortic valve calcification with moderate stenosis, and elevated right atrial pressure.  Cardiology was on board, was on IV heparin  drip likely for 48 hours, no further changes in treatment per cardiology will be discharged on combination of baby aspirin , Coreg  and Crestor , not a candidate for invasive procedures,, remains chest pain-free, hypoxic respiratory failure resolved currently symptom-free on room air.  PCP to monitor   # Symptomatic transaminitis due to shock liver from hypotension and sepsis  - Asymptomatic, no abdominal pain or RUQ tenderness total bilirubin stable, trend improving.  Symptom-free.  PCP to recheck CMP in 7 to 10 days.   # Rheumatoid arthritis - On chronic steroids at home, was initially on stress dose steroids tapered to oral steroid prednisone  20 mg daily on 10/19/2024   Chronic pain and narcotic use.  Minimize use, patient wants her home regimen continued at the scheduled interval, understands that she is at risk for accidental overdose.  Often gets extremely sleepy and drowsy.  Counseled.   # Hypertension  -stable blood pressure on present regimen.  On home dose prednisone  as well.  Initially required stress dose.   # Hypothyroidism  - Continue home Synthroid    # Generalized weakness and debility  # Multiple falls # Disposition- PT OT, qualified for SNF but patient and family chose to go home with home health which she was getting before.  PCP to continue to monitor     Discharge diagnosis     Principal Problem:   Sepsis [facility])    Discharge instructions    Discharge Instructions     Discharge instructions    Complete by: As directed    Follow with Primary MD Hartwell Area, PA-C in 7 days   Get CBC, CMP, Magnesium , 2 view Chest X ray -  checked next visit with your primary MD    Activity: As tolerated with Full fall precautions use walker/cane & assistance as needed  Disposition Home    Diet: Heart Healthy soft consistency easy to swallow.  Special Instructions: If you have smoked or chewed Tobacco  in the last 2 yrs please stop smoking, stop any regular Alcohol   and or any Recreational drug use.  On your next visit  with your primary care physician please Get Medicines reviewed and adjusted.  Please request your Prim.MD to go over all Hospital Tests and Procedure/Radiological results at the follow up, please get all Hospital records sent to your Prim MD by signing hospital release before you go home.  If you experience worsening of your admission symptoms, develop shortness of breath, life threatening emergency, suicidal or homicidal thoughts you must seek medical attention immediately by calling 911 or calling your MD immediately  if symptoms less severe.  You Must read complete instructions/literature along with all the possible adverse reactions/side effects for all the Medicines you take and that have been prescribed to you. Take any new Medicines after you have completely understood and accpet all the possible adverse reactions/side effects.   Do not drive when taking Pain medications.  Do not take more than prescribed Pain, Sleep and Anxiety Medications  Wear Seat belts while driving.   Discharge wound care:   Complete by: As directed    Apply Xeroform gauze to right heel, bilat leg weeping areas Q Tues/Thurs/Sat, then cover with ABD pads and kerlex and ace wrap, beginning just behind toes to below knees.       Discharge Medications   Allergies as of 10/23/2024       Reactions   Dilaudid  [hydromorphone  Hcl] Shortness Of Breath   Gabapentin Other (See Comments)    Hoarseness , headache and sore throat   Latex Rash   Severe rash   Lyrica [pregabalin] Other (See Comments)   No balance , had to walk with cane , Blurred vision,weakness.   Oxycodone  Shortness Of Breath, Other (See Comments)   Tolerated oxycodone  during admission to hospital 02/06/22   Singulair [montelukast] Shortness Of Breath, Other (See Comments)   Vision issues, also   Ciprofloxacin Hcl Nausea And Vomiting, Other (See Comments)   Nausea and vomiting with by mouth form   Codeine Other (See Comments)   Hallucinations   Methadone Nausea And Vomiting, Other (See Comments)   Severe nausea and vomiting   Metronidazole  Nausea And Vomiting, Other (See Comments)   Gastric pain   Oysters [shellfish Allergy] Other (See Comments)   Terrible gastric upset and cramping.   Clindamycin /lincomycin Diarrhea, Nausea Only   Donepezil Diarrhea, Other (See Comments)   Severe diarrhea   Ferrous Sulfate     Severe gi upset, will get an infusion when needed per caregiver   Sulfa Antibiotics Diarrhea, Other (See Comments)   GI issues   Tape Other (See Comments)   ADHESIVE TAPE-Severe rash   Zetia [ezetimibe] Diarrhea   Elemental Sulfur Nausea And Vomiting   Iodine  Rash   Duplicate entry   Other Rash   All Antibiotic ointments/ creams   Oyster Shell Rash   Povidone Iodine  Rash, Other (See Comments)   Oyster shell products- Rash   Povidone-iodine     Duplicate entry   Skintegrity Hydrogel [skin Protectants, Misc.] Rash   Tapentadol Other (See Comments)   Nightmares **Nucynta**        Medication List     STOP taking these medications    amoxicillin -clavulanate 875-125 MG tablet Commonly known as: AUGMENTIN  Replaced by: amoxicillin -clavulanate 500-125 MG tablet   losartan  25 MG tablet Commonly known as: COZAAR    tetrahydrozoline 0.05 % ophthalmic solution   triamcinolone  55 MCG/ACT Aero nasal inhaler Commonly known as: NASACORT        TAKE these medications     amoxicillin -clavulanate 500-125 MG tablet Commonly known as: AUGMENTIN  Take 1 tablet by mouth 2 (two)  times daily for 3 days. Replaces: amoxicillin -clavulanate 875-125 MG tablet   aspirin  EC 81 MG tablet Take 1 tablet (81 mg total) by mouth daily. Swallow whole.   carvedilol  3.125 MG tablet Commonly known as: COREG  Take 1 tablet (3.125 mg total) by mouth 2 (two) times daily with a meal.   cholecalciferol  1000 units tablet Commonly known as: VITAMIN D  Take 1,000 Units by mouth daily.   [employer] CALCIUM  CHEWS PO Take 1 tablet by mouth daily.   diclofenac  sodium 1 % Gel Commonly known as: VOLTAREN  Apply 2 g topically daily as needed (for knee pain).   diphenhydrAMINE  25 MG tablet Commonly known as: BENADRYL  Take 25 mg by mouth at bedtime as needed for allergies or sleep.   HYDROcodone -acetaminophen  10-325 MG tablet Commonly known as: NORCO Take 1 tablet by mouth every 6 (six) hours. 0400, 1000, 1600, 2200   hydrOXYzine  25 MG tablet Commonly known as: ATARAX  Take 25 mg by mouth 3 (three) times daily as needed for anxiety.   levothyroxine  75 MCG tablet Commonly known as: SYNTHROID  Take 75 mcg by mouth daily.   linezolid  600 MG tablet Commonly known as: ZYVOX  Take 1 tablet (600 mg total) by mouth every 12 (twelve) hours for 3 days.   loperamide  2 MG tablet Commonly known as: IMODIUM  A-D Take 4 mg by mouth 3 (three) times daily as needed for diarrhea or loose stools.   Melatonin 5 MG Chew Chew 10 mg by mouth at bedtime.   methocarbamol  500 MG tablet Commonly known as: ROBAXIN  Take 500 mg by mouth daily as needed for muscle spasms.   MULTIVITAMIN PO Take 1 tablet by mouth daily.   ondansetron  4 MG tablet Commonly known as: ZOFRAN  Take 1 tablet (4 mg total) by mouth 2 (two) times daily. Take before each dose of augmentin  and can take again if needed up to 3 doses per day What changed:  when to take this reasons to take this   predniSONE  10 MG tablet Commonly  known as: DELTASONE  TAKE 1 TABLET (10 MG TOTAL) BY MOUTH DAILY WITH BREAKFAST.   PROBIOTIC ACIDOPHILUS PO Take 1 capsule by mouth daily.   Restasis  0.05 % ophthalmic emulsion Generic drug: cycloSPORINE  Place 1 drop into both eyes daily.   rosuvastatin  10 MG tablet Commonly known as: CRESTOR  Take 1 tablet (10 mg total) by mouth daily.   VASHE WOUND THERAPY EX Apply 1 application  topically daily. Given samples by provider               Discharge Care Instructions  (From admission, onward)           Start     Ordered   10/23/24 0000  Discharge wound care:       Comments: Apply Xeroform gauze to right heel, bilat leg weeping areas Q Tues/Thurs/Sat, then cover with ABD pads and kerlex and ace wrap, beginning just behind toes to below knees.   10/23/24 0810             Follow-up Information     Hedgecock, Suzanne, PA-C. Schedule an appointment as soon as possible for a visit in 1 week(s).   Specialty: Physician Assistant Contact information: [address] [place] [address] [phone]         Fleeta Rothman, Jomarie SAILOR, MD. Schedule an appointment as soon as possible for a visit in 1 week(s).   Specialty: Infectious Diseases Contact information: [address] [place] [address] [phone]  Major procedures and Radiology Reports - PLEASE review detailed and final reports thoroughly  -      VAS US  ABI WITH/WO TBI Result Date: 10/19/2024  LOWER EXTREMITY DOPPLER STUDY Patient Name:  Isabel Barnes  Date of Exam:   10/19/2024 Medical Rec #: [id number]          Accession #:    [id number] Date of Birth: 04-26-1941           Patient Gender: F Patient Age:   45 years Exam Location:  [facility] Procedure:      VAS US  ABI WITH/WO TBI Referring Phys: LAVADA [facility] --------------------------------------------------------------------------------  Indications: Ulceration (Chronic) High Risk Factors:  Hypertension, hyperlipidemia, no history of smoking.  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 Limitations: Today's exam was limited due to Bandages, skin texture, edema,              venous interference. Comparison Study: Prior ABI done 09/09/23 Performing Technologist: Rachel Pellet RVS  Examination Guidelines: A complete evaluation includes at minimum, Doppler waveform signals and systolic blood pressure reading at the level of bilateral brachial, anterior tibial, and posterior tibial arteries, when vessel segments are accessible. Bilateral testing is considered an integral part of a complete examination. Photoelectric Plethysmograph (PPG) waveforms and toe systolic pressure readings are included as required and additional duplex testing as needed. Limited examinations for reoccurring indications may be performed as noted.  ABI Findings: +---------+------------------+-----+---------+--------+ Right    Rt Pressure (mmHg)IndexWaveform Comment  +---------+------------------+-----+---------+--------+ Brachial 129                    triphasic         +---------+------------------+-----+---------+--------+ PTA      147               1.14 biphasic          +---------+------------------+-----+---------+--------+ DP       254               1.97 biphasic          +---------+------------------+-----+---------+--------+ Great Toe61                0.47 Normal            +---------+------------------+-----+---------+--------+ +---------+------------------+-----+-----------+-------+ Left     Lt Pressure (mmHg)IndexWaveform   Comment +---------+------------------+-----+-----------+-------+ Brachial 117                    triphasic          +---------+------------------+-----+-----------+-------+ PTA      254               1.97 multiphasic        +---------+------------------+-----+-----------+-------+ DP       185               1.43  multiphasic        +---------+------------------+-----+-----------+-------+ Great Toe70                0.54 Normal             +---------+------------------+-----+-----------+-------+ +-------+-----------+-----------+------------+------------+ ABI/TBIToday's ABIToday's TBIPrevious ABIPrevious TBI +-------+-----------+-----------+------------+------------+ Right  1.97       0.47       1.37        0.25         +-------+-----------+-----------+------------+------------+ Left   1.97       0.54       1.49        0.84         +-------+-----------+-----------+------------+------------+  Arterial wall calcification precludes accurate ankle pressures and ABIs. Bilateral ABIs appear essentially unchanged compared to prior study on 09/09/23. Right TBIs appear increased. Left TBIs appear decreased.  Summary: Right: Resting right ankle-brachial index indicates noncompressible right lower extremity arteries. Right toe pressure is >60 mmHg which suggests adequate perfusion for healing. Left: Resting left ankle-brachial index indicates noncompressible left lower extremity arteries. Left toe pressure is >60 mmHg which suggests adequate perfusion for healing. *See table(s) above for measurements and observations.  Electronically signed by Fonda Rim on 10/19/2024 at 7:27:59 PM.    Final    CT TIBIA FIBULA RIGHT W WO CONTRAST Result Date: 10/19/2024 CLINICAL DATA:  Soft tissue infection suspected EXAM: CT OF THE LOWER RIGHT EXTREMITY WITHOUT CONTRAST TECHNIQUE: Multidetector CT imaging of the lower right extremity from the distal femur through the right ankle was performed following the standard protocol before and during bolus administration of intravenous contrast. RADIATION DOSE REDUCTION: This exam was performed according to the departmental dose-optimization program which includes automated exposure control, adjustment of the mA and/or kV according to patient size and/or use of iterative  reconstruction technique. CONTRAST:  75mL OMNIPAQUE  IOHEXOL  350 MG/ML SOLN COMPARISON:  07/26/2024, 09/08/2023 FINDINGS: Bones/Joint/Cartilage Unremarkable right knee arthroplasty. There is stable cortical thickening along the ventral margin of the right tibial diaphysis, compatible with the chronic osteomyelitis described on previous MRI. There is a metallic staple imbedded within the distal aspect of the cortical thickening, unchanged since prior MRI. There are no new areas of bony destruction or periosteal reaction. There are no acute displaced fractures. The bones are diffusely osteopenic. Prominent osteoarthritis of the right ankle and foot. Ligaments Suboptimally assessed by CT. Muscles and Tendons No acute findings. Soft tissues There is marked dermal thickening and subcutaneous edema involving the distal right lower leg, ankle, and visualized portions of the right foot. No evidence of fluid collection or abscess. No subcutaneous gas. There is a large soft tissue wound involving the anteromedial aspect of the proximal right lower leg, with multiple surgical staples within the subcutaneous tissues along the margins of this defect. Reconstructed images demonstrate no additional findings. IMPRESSION: 1. Large soft tissue defect within the anteromedial proximal right lower leg consistent with known wound. Retained staples are seen along the margins of the wound, likely from prior surgical intervention and skin graft. No significant change since prior MRI. 2. Stable marked ventral cortical thickening of the right tibial diaphysis, compatible with chronic osteomyelitis described on previous MRI. No new areas of bony destruction or periosteal reaction on this exam. 3. Diffuse dermal thickening and subcutaneous edema of the distal right lower leg compatible with cellulitis. No evidence of fluid collection or abscess. Electronically Signed   By: Ozell Daring M.D.   On: 10/19/2024 18:46   ECHOCARDIOGRAM  COMPLETE Result Date: 10/18/2024    ECHOCARDIOGRAM REPORT   Patient Name:   Isabel Barnes Date of Exam: 10/18/2024 Medical Rec #:  [id number]         Height:       62.0 in Accession #:    [id number]        Weight:       102.0 lb Date of Birth:  Jan 28, 1941          BSA:          1.436 m Patient Age:    83 years          BP:           154/70 mmHg Patient Gender:  F                 HR:           86 bpm. Exam Location:  Inpatient Procedure: 2D Echo, Cardiac Doppler, Color Doppler and PEDOF (Both Spectral and            Color Flow Doppler were utilized during procedure). Indications:    NSTEMI I21.4  History:        Patient has prior history of Echocardiogram examinations, most                 recent 02/08/2022. NSTEMI; Risk Factors:Hypertension and                 Dyslipidemia.  Sonographer:    Koleen Popper RDCS Referring Phys: CURTISTINE LITTIE FARR  Sonographer Comments: Image acquisition challenging due to patient body habitus. IMPRESSIONS  1. Left ventricular ejection fraction, by estimation, is 65 to 70%. The left ventricle has normal function. The left ventricle has no regional wall motion abnormalities. 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 Left ventricular diastolic parameters are indeterminate. Elevated left atrial pressure.  2. Right ventricular systolic function is normal. The right ventricular size is normal. There is severely elevated pulmonary artery systolic pressure. The estimated right ventricular systolic pressure is 61.8 mmHg.  3. Left atrial size was severely dilated.  4. Right atrial size was severely dilated.  5. The mitral valve is degenerative. Moderate mitral valve regurgitation. No evidence of mitral stenosis. Severe mitral annular calcification.  6. Tricuspid valve regurgitation is moderate to severe.  7. The aortic valve is abnormal. There is severe calcifcation of the aortic valve. Aortic valve regurgitation is not visualized. Moderate aortic valve  stenosis. Aortic valve area, by VTI measures 1.50 cm. Aortic valve mean gradient measures 28.0 mmHg. Aortic valve Vmax measures 3.47 m/s.  8. The inferior vena cava is dilated in size with <50% respiratory variability, suggesting right atrial pressure of 15 mmHg. FINDINGS  Left Ventricle: Left ventricular ejection fraction, by estimation, is 65 to 70%. The left ventricle has normal function. The left ventricle has no regional wall motion abnormalities. The left ventricular internal cavity size was normal in size. There is  moderate asymmetric left ventricular hypertrophy of the basal-septal segment. Left ventricular diastolic parameters are indeterminate. Elevated left atrial pressure. Right Ventricle: The right ventricular size is normal. No increase in right ventricular wall thickness. Right ventricular systolic function is normal. There is severely elevated pulmonary artery systolic pressure. The tricuspid regurgitant velocity is 3.42 m/s, and with an assumed right atrial pressure of 15 mmHg, the estimated right ventricular systolic pressure is 61.8 mmHg. Left Atrium: Left atrial size was severely dilated. Right Atrium: Right atrial size was severely dilated. Pericardium: There is no evidence of pericardial effusion. Mitral Valve: The mitral valve is degenerative in appearance. Severe mitral annular calcification. Moderate mitral valve regurgitation. No evidence of mitral valve stenosis. MV peak gradient, 12.7 mmHg. The mean mitral valve gradient is 4.0 mmHg with average heart rate of 82 bpm. Tricuspid Valve: The tricuspid valve is normal in structure. Tricuspid valve regurgitation is moderate to severe. No evidence of tricuspid stenosis. Aortic Valve: The aortic valve is abnormal. There is severe calcifcation of the aortic valve. Aortic valve regurgitation is not visualized. Moderate aortic stenosis is present. Aortic valve mean gradient measures 28.0 mmHg. Aortic valve peak gradient measures 48.2 mmHg. Aortic  valve area, by VTI measures 1.50 cm. Pulmonic Valve: The pulmonic  valve was not well visualized. Pulmonic valve regurgitation is trivial. No evidence of pulmonic stenosis. Aorta: The aortic root is normal in size and structure. Venous: The inferior vena cava is dilated in size with less than 50% respiratory variability, suggesting right atrial pressure of 15 mmHg. IAS/Shunts: No atrial level shunt detected by color flow Doppler.  LEFT VENTRICLE PLAX 2D LVIDd:         4.30 cm   Diastology LVIDs:         2.50 cm   LV e' medial:    7.18 cm/s LV PW:         1.10 cm   LV E/e' medial:  21.0 LV IVS:        1.50 cm   LV e' lateral:   8.67 cm/s LVOT diam:     1.90 cm   LV E/e' lateral: 17.4 LV SV:         107 LV SV Index:   75 LVOT Area:     2.84 cm  RIGHT VENTRICLE             IVC RV Basal diam:  4.20 cm     IVC diam: 2.80 cm RV Mid diam:    2.60 cm RV S prime:     10.50 cm/s TAPSE (M-mode): 2.0 cm LEFT ATRIUM             Index        RIGHT ATRIUM           Index LA diam:        4.50 cm 3.13 cm/m   RA Area:     21.60 cm LA Vol (A2C):   60.1 ml 41.85 ml/m  RA Volume:   63.00 ml  43.87 ml/m LA Vol (A4C):   67.0 ml 46.66 ml/m LA Biplane Vol: 63.2 ml 44.01 ml/m  AORTIC VALVE AV Area (Vmax):    1.51 cm AV Area (Vmean):   1.47 cm AV Area (VTI):     1.50 cm AV Vmax:           347.00 cm/s AV Vmean:          239.000 cm/s AV VTI:            0.713 m AV Peak Grad:      48.2 mmHg AV Mean Grad:      28.0 mmHg LVOT Vmax:         185.00 cm/s LVOT Vmean:        124.000 cm/s LVOT VTI:          0.378 m LVOT/AV VTI ratio: 0.53  AORTA Ao Root diam: 2.75 cm Ao Asc diam:  2.90 cm MITRAL VALVE                TRICUSPID VALVE MV Area (PHT): 3.95 cm     TR Peak grad:   46.8 mmHg MV Area VTI:   3.27 cm     TR Vmax:        342.00 cm/s MV Peak grad:  12.7 mmHg MV Mean grad:  4.0 mmHg     SHUNTS MV Vmax:       1.78 m/s     Systemic VTI:  0.38 m MV Vmean:      97.4 cm/s    Systemic Diam: 1.90 cm MV Decel Time: 192 msec MV E velocity: 150.50  cm/s MV A velocity: 81.50 cm/s MV E/A ratio:  1.85 Soyla Merck MD Electronically signed by Soyla Merck MD  Signature Date/Time: 10/18/2024/11:29:23 AM    Final    DG Chest Port 1 View Result Date: 10/17/2024 EXAM: 1 VIEW(S) XRAY OF THE CHEST 10/17/2024 10:34:00 PM COMPARISON: 10/17/2024 CLINICAL HISTORY: SOB (shortness of breath) FINDINGS: LUNGS AND PLEURA: Increased bilateral perihilar interstitial and airspace opacities. No pleural effusion. No pneumothorax. Limited evaluation due to rotation. HEART AND MEDIASTINUM: Cardiomegaly. BONES AND SOFT TISSUES: Right shoulder arthroplasty. ACDF noted. No acute osseous abnormality. IMPRESSION: 1. Increased bilateral perihilar interstitial and airspace opacities. Limited evaluation due to rotation. 2. Cardiomegaly. Electronically signed by: Morgane Naveau MD 10/17/2024 10:58 PM EST RP Workstation: [id number]   CT Cervical Spine Wo Contrast Result Date: 10/17/2024 EXAM: CT CERVICAL SPINE WITHOUT CONTRAST 10/17/2024 04:16:12 PM TECHNIQUE: CT of the cervical spine was performed without the administration of intravenous contrast. Multiplanar reformatted images are provided for review. Automated exposure control, iterative reconstruction, and/or weight based adjustment of the mA/kV was utilized to reduce the radiation dose to as low as reasonably achievable. COMPARISON: CT cervical spine 04/25/2020. CLINICAL HISTORY: Facial trauma, blunt. Fall. FINDINGS: CERVICAL SPINE: BONES AND ALIGNMENT: No acute fracture. Chronic grade 1 anterolisthesis of C4 on C5, C5 on C6, and C7 on T1. 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 Large chronic cyst or erosion involving the dens. Slight interval widening of the atlantodental interval measuring 2 mm and attributed to progressive arthropathy. Suspected partial fusion across the left lateral C1-C2 articulation. Progressive left atlantooccipital arthropathy. Previous C3-C4 and C6-C7 ACDF. DEGENERATIVE  CHANGES: Widespread facet arthrosis with multilevel facet ankylosis. Partially calcified ligamentous thickening/pannus posterior to the dens results in mild to moderate spinal stenosis. SOFT TISSUES: No prevertebral soft tissue swelling. LUNGS: Partially visualized extensive pulmonary ground glass opacities in the right greater than left upper lobes. IMPRESSION: 1. No acute cervical spine fracture. 2. 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 3. Partially visualized bilateral pulmonary ground-glass opacities, possibly reflecting edema or pneumonia (including viral pneumonia). Electronically signed by: Dasie Hamburg MD 10/17/2024 04:39 PM EST RP Workstation: [id number]   CT Head Wo Contrast Result Date: 10/17/2024 EXAM: CT HEAD WITHOUT CONTRAST 10/17/2024 04:16:12 PM TECHNIQUE: CT of the head was performed without the administration of intravenous contrast. Automated exposure control, iterative reconstruction, and/or weight based adjustment of the mA/kV was utilized to reduce the radiation dose to as low as reasonably achievable. COMPARISON: Head CT 02/06/2022. CLINICAL HISTORY: Delirium. FINDINGS: BRAIN AND VENTRICLES: There is no evidence of an acute infarct, intracranial hemorrhage, mass, midline shift, hydrocephalus, or extra-axial fluid collection. There is mild cerebral atrophy. Patchy to confluent hypodensities in the cerebral white matter bilaterally are similar to the prior study and nonspecific but compatible with moderate chronic small vessel ischemic disease. Calcified atherosclerosis at the skull base. ORBITS: No acute abnormality. Bilateral cataract extraction. SINUSES: Mild mucosal thickening in the ethmoid sinuses. Clear mastoid air cells. SOFT TISSUES AND SKULL: No acute soft tissue abnormality. No skull fracture. IMPRESSION: 1. No acute intracranial abnormality. 2. Moderate chronic small vessel  ischemic disease. Electronically signed by: Dasie Hamburg MD 10/17/2024 04:22 PM EST RP Workstation: [id number]   DG Chest Port 1 View Result Date: 10/17/2024 EXAM: 1 VIEW(S) XRAY OF THE CHEST 10/17/2024 01:00:00 PM COMPARISON: Comparison 02/22/2022. CLINICAL HISTORY: Questionable sepsis - evaluate for abnormality. FINDINGS: LUNGS AND PLEURA: No focal pulmonary opacity. No pleural effusion. No pneumothorax. HEART AND MEDIASTINUM: Stable cardiomediastinal silhouette. BONES AND SOFT TISSUES: Status post right shoulder arthroplasty. No acute osseous abnormality. IMPRESSION:  1. No acute cardiopulmonary process. Electronically signed by: Lynwood Seip MD 10/17/2024 01:32 PM EST RP Workstation: [id number]    Micro Results    Recent Results (from the past 240 hours)  Culture, blood (Routine x 2)     Status: None   Collection Time: 10/17/24 12:02 PM   Specimen: BLOOD LEFT ARM  Result Value Ref Range Status   Specimen Description BLOOD LEFT ARM  Final   Special Requests   Final    BOTTLES DRAWN AEROBIC AND ANAEROBIC Blood Culture results may not be optimal due to an inadequate volume of blood received in culture bottles   Culture   Final    NO GROWTH 5 DAYS Performed at [facility], [address], [place], [place] [address]    Report Status 10/22/2024 FINAL  Final  Culture, blood (Routine x 2)     Status: None   Collection Time: 10/17/24 12:02 PM   Specimen: BLOOD RIGHT ARM  Result Value Ref Range Status   Specimen Description BLOOD RIGHT ARM  Final   Special Requests   Final    AEROBIC BOTTLE ONLY Blood Culture results may not be optimal due to an inadequate volume of blood received in culture bottles   Culture   Final    NO GROWTH 5 DAYS Performed at [facility], [address], [place], [place] [address]    Report Status 10/22/2024 FINAL  Final  Resp panel by RT-PCR (RSV, Flu A&B, Covid) Anterior Nasal Swab     Status: None   Collection Time: 10/17/24 12:17 PM   Specimen:  Anterior Nasal Swab  Result Value Ref Range Status   SARS Coronavirus 2 by RT PCR NEGATIVE NEGATIVE Final   Influenza A by PCR NEGATIVE NEGATIVE Final   Influenza B by PCR NEGATIVE NEGATIVE Final    Comment: (NOTE) The Xpert Xpress SARS-CoV-2/FLU/RSV plus assay is intended as an aid in the diagnosis of influenza from Nasopharyngeal swab specimens and should not be used as a sole basis for treatment. Nasal washings and aspirates are unacceptable for Xpert Xpress SARS-CoV-2/FLU/RSV testing.  Fact Sheet for Patients: bloggercourse.com  Fact Sheet for Healthcare Providers: seriousbroker.it  This test is not yet approved or cleared by the [place]  FDA and has been authorized for detection and/or diagnosis of SARS-CoV-2 by FDA under an Emergency Use Authorization (EUA). This EUA will remain in effect (meaning this test can be used) for the duration of the COVID-19 declaration under Section 564(b)(1) of the Act, 21 U.S.C. section 360bbb-3(b)(1), unless the authorization is terminated or revoked.     Resp Syncytial Virus by PCR NEGATIVE NEGATIVE Final    Comment: (NOTE) Fact Sheet for Patients: bloggercourse.com  Fact Sheet for Healthcare Providers: seriousbroker.it  This test is not yet approved or cleared by the [place]  FDA and has been authorized for detection and/or diagnosis of SARS-CoV-2 by FDA under an Emergency Use Authorization (EUA). This EUA will remain in effect (meaning this test can be used) for the duration of the COVID-19 declaration under Section 564(b)(1) of the Act, 21 U.S.C. section 360bbb-3(b)(1), unless the authorization is terminated or revoked.  Performed at [facility], [address] [place], [place] [address]   Respiratory (~20 pathogens) panel by PCR     Status: None   Collection Time: 10/17/24 12:17 PM   Specimen: Nasopharyngeal  Swab; Respiratory  Result Value Ref Range Status   Adenovirus NOT DETECTED NOT DETECTED Final   Coronavirus 229E NOT  DETECTED NOT DETECTED Final    Comment: (NOTE) The Coronavirus on the Respiratory Panel, DOES NOT test for the novel  Coronavirus (2019 nCoV)    Coronavirus HKU1 NOT DETECTED NOT DETECTED Final   Coronavirus NL63 NOT DETECTED NOT DETECTED Final   Coronavirus OC43 NOT DETECTED NOT DETECTED Final   Metapneumovirus NOT DETECTED NOT DETECTED Final   Rhinovirus / Enterovirus NOT DETECTED NOT DETECTED Final   Influenza A NOT DETECTED NOT DETECTED Final   Influenza B NOT DETECTED NOT DETECTED Final   Parainfluenza Virus 1 NOT DETECTED NOT DETECTED Final   Parainfluenza Virus 2 NOT DETECTED NOT DETECTED Final   Parainfluenza Virus 3 NOT DETECTED NOT DETECTED Final   Parainfluenza Virus 4 NOT DETECTED NOT DETECTED Final   Respiratory Syncytial Virus NOT DETECTED NOT DETECTED Final   Bordetella pertussis NOT DETECTED NOT DETECTED Final   Bordetella Parapertussis NOT DETECTED NOT DETECTED Final   Chlamydophila pneumoniae NOT DETECTED NOT DETECTED Final   Mycoplasma pneumoniae NOT DETECTED NOT DETECTED Final    Comment: Performed at [facility], [address] [place], [place] [address]  C Difficile Quick Screen w PCR reflex     Status: Abnormal   Collection Time: 10/17/24  5:28 PM   Specimen: STOOL  Result Value Ref Range Status   C Diff antigen POSITIVE (A) NEGATIVE Final   C Diff toxin NEGATIVE NEGATIVE Final   C Diff interpretation Results are indeterminate. See PCR results.  Final    Comment: Performed at [facility], [address], [place], [place] [address]  C. Diff by PCR, Reflexed     Status: None   Collection Time: 10/17/24  5:28 PM  Result Value Ref Range Status   Toxigenic C. Difficile by PCR NEGATIVE NEGATIVE Final    Comment: Patient is colonized with non toxigenic C. difficile. May not need treatment unless significant symptoms are present.    Hypervirulent Strain PRESUMPTIVE NEGATIVE PRESUMPTIVE NEGATIVE Final    Comment: Performed at [facility], [address] [place], [place] [address]  MRSA Next Gen by PCR, Nasal     Status: None   Collection Time: 10/18/24  4:14 AM   Specimen: Nasal Mucosa; Nasal Swab  Result Value Ref Range Status   MRSA by PCR Next Gen NOT DETECTED NOT DETECTED Final    Comment: (NOTE) The GeneXpert MRSA Assay (FDA approved for NASAL specimens only), is one component of a comprehensive MRSA colonization surveillance program. It is not intended to diagnose MRSA infection nor to guide or monitor treatment for MRSA infections. Test performance is not FDA approved in patients less than 82 years old. Performed at [facility], [address], [place], [place] [address]     Today   Subjective    Isabel Barnes today has no headache,no chest abdominal pain,no new weakness tingling or numbness, feels much better wants to go home today.    Objective   Blood pressure 160/75, pulse 74, temperature (!) 97 F (36.1 C), temperature source Axillary, resp. rate 17, height 5' 2 (1.575 m), weight 66.9 kg, SpO2 98%.   Intake/Output Summary (Last 24 hours) at 10/23/2024 0810 Last data filed at 10/22/2024 2130 Gross per 24 hour  Intake 240 ml  Output --  Net 240 ml    Exam  Awake Alert, No new F.N deficits,    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, both lower extremities under bandage, much improved cellulitis   Data Review   Recent Labs  Lab 10/18/24 0443 10/19/24 0702 10/20/24 0348 10/21/24 0350 10/22/24 0342  WBC 16.0* 22.2* 19.4* 10.3 7.7  HGB 10.7* 10.8* 10.6* 9.7* 9.9*  HCT 33.3* 33.3* 31.8* 29.9* 31.2*  PLT 225 229 276 254 254  MCV 87.6 87.4 85.3 86.7 87.4  MCH 28.2 28.3 28.4 28.1  27.7  MCHC 32.1 32.4 33.3 32.4 31.7  RDW 14.2 14.2 14.0 14.0 13.9  LYMPHSABS 0.4* 0.6* 0.5* 0.8 0.9  MONOABS 0.3 0.9 0.5 0.7 0.6  EOSABS 0.0 0.0 0.0 0.0 0.0  BASOSABS 0.0 0.0 0.0 0.0 0.0    Recent Labs  Lab 10/17/24 1204 10/17/24 1220 10/17/24 1413 10/17/24 2259 10/17/24 2300 10/18/24 0443 10/19/24 0702 10/20/24 0348 10/21/24 0350 10/22/24 0342  NA 137  --   --   --   --  140 138 138 140 138  K 3.9  --   --   --   --  3.6 3.9 3.6 3.6 4.4  CL 101  --   --   --   --  105 104 104 106 104  CO2 22  --   --   --   --  25 18* 21* 26 30  ANIONGAP 14  --   --   --   --  10 16* 13 8 4*  GLUCOSE 129*  --   --   --   --  137* 133* 108* 93 88  BUN 15  --   --   --   --  16 27* 34* 33* 27*  CREATININE 0.96  --   --  0.93  --  0.96 0.94 1.06* 0.96 1.04*  AST 92*  --   --   --   --  99* 80* 54* 36 32  ALT 113*  --   --   --   --  82* 68* 64* 51* 44  ALKPHOS 138*  --   --   --   --  98 98 87 64 67  BILITOT 1.1  --   --   --   --  1.0 0.6 0.7 0.5 0.6  ALBUMIN  3.4*  --   --   --   --  2.4* 2.4* 2.5* 2.3* 2.3*  CRP  --   --   --   --   --  17.5* 18.5* 10.3* 4.3*  --   PROCALCITON  --   --   --  17.40  --  21.50  --  12.50 5.37 2.42  LATICACIDVEN  --  3.5* 4.0*  --   --  1.5  --   --   --   --   INR 1.0  --   --   --   --   --   --   --   --   --   BNP  --   --   --   --  2,245.5*  --   --   --   --   --   MG  --   --   --   --   --  1.6* 2.5* 2.4 2.3  --   PHOS  --   --   --   --   --  3.1 2.9 2.9 2.9  --   CALCIUM  8.7*  --   --   --   --  8.1* 8.0* 8.2* 8.1* 8.0*    Total Time in preparing paper work, data evaluation and todays exam - 35 minutes  Signature  -    Lavada Stank M.D on 10/23/2024 at 8:10 AM   -  To page go to [email or web address]

## 2024-10-23 NOTE — TOC Transition Note (Addendum)
 Transition of Care [facility]) - Discharge Note   Patient Details  Name: Isabel Barnes MRN: [id number] Date of Birth: 1940/12/10  [facility] CM/SW Contact:  Landry DELENA Senters, RN Phone Number: 10/23/2024, 11:43 AM   Clinical Narrative:    Patient to discharge home today, with caregiver kelly providing transportation home.   HH services arranged via [facility], info on AVS.   [facility] Palliative care to follow up with patient for home visit, info added to AVS.  CM did speak with caregiver to confirm transportation plan and also spoke with patient's daughter, Mitzie to let her know of patient d/c.  No further needs identified by CM.   Final next level of care: Home w Home Health Services Barriers to Discharge: No Barriers Identified   Patient Goals and CMS Choice Patient states their goals for this hospitalization and ongoing recovery are:: TBD; pending progress and discussion of goals CMS Medicare.gov Compare Post Acute Care list provided to:: Patient Choice offered to / list presented to : Patient      Discharge Placement                       Discharge Plan and Services Additional resources added to the After Visit Summary for   In-house Referral: Clinical Social Work   Post Acute Care Choice: NA                    HH Arranged: CHARITY FUNDRAISER, PT, Nurse's Aide, Social Work [facility] Agency: [facility] Date HH Agency Contacted: 10/23/24 Time HH Agency Contacted: 1143 Representative spoke with at [facility] Agency: [place]  Social Drivers of Health (SDOH) Interventions SDOH Screenings   Food Insecurity: No Food Insecurity (10/19/2024)  Housing: Low Risk  (10/19/2024)  Transportation Needs: No Transportation Needs (10/19/2024)  Utilities: Not At Risk (10/19/2024)  Depression (PHQ2-9): Low Risk  (12/28/2023)  Financial Resource Strain: Low Risk  (05/25/2024)   Received from [facility]  Physical Activity: Unknown (05/25/2024)   Received from [facility]  Social Connections: Patient Declined (10/19/2024)  Stress: No Stress Concern Present (05/25/2024)   Received from [facility]  Tobacco Use: Medium Risk (10/17/2024)     Readmission Risk Interventions    09/16/2023    1:44 PM 09/09/2023    3:51 PM  Readmission Risk Prevention Plan  Post Dischage Appt  Complete  Medication Screening  Complete  Transportation Screening Complete Complete  PCP or Specialist Appt within 5-7 Days Complete   Home Care Screening Complete   Medication Review (RN CM) Complete

## 2024-10-23 NOTE — Care Management Important Message (Signed)
 Important Message  Patient Details  Name: Isabel Barnes MRN: [id number] Date of Birth: 1941-05-24   Important Message Given:  No     [name] 10/23/2024, 4:33 PM

## 2024-10-29 ENCOUNTER — Other Ambulatory Visit: Payer: Self-pay

## 2024-10-29 ENCOUNTER — Encounter: Payer: Self-pay | Admitting: Orthopedic Surgery

## 2024-10-29 ENCOUNTER — Ambulatory Visit: Admitting: Orthopedic Surgery

## 2024-10-29 DIAGNOSIS — M79671 Pain in right foot: Secondary | ICD-10-CM | POA: Diagnosis not present

## 2024-10-29 DIAGNOSIS — I872 Venous insufficiency (chronic) (peripheral): Secondary | ICD-10-CM

## 2024-10-29 DIAGNOSIS — I890 Lymphedema, not elsewhere classified: Secondary | ICD-10-CM | POA: Diagnosis not present

## 2024-10-29 DIAGNOSIS — L97914 Non-pressure chronic ulcer of unspecified part of right lower leg with necrosis of bone: Secondary | ICD-10-CM | POA: Diagnosis not present

## 2024-10-29 DIAGNOSIS — G8929 Other chronic pain: Secondary | ICD-10-CM

## 2024-10-29 DIAGNOSIS — B370 Candidal stomatitis: Secondary | ICD-10-CM | POA: Diagnosis not present

## 2024-10-29 MED ORDER — LIDOCAINE VISCOUS HCL 2 % MT SOLN: 5.0000 mL | Freq: Three times a day (TID) | OROMUCOSAL | 0 refills | Status: AC | PRN

## 2024-10-29 MED ORDER — MAGIC MOUTHWASH: 2.0000 mL | Freq: Three times a day (TID) | ORAL | Status: AC

## 2024-10-29 NOTE — Progress Notes (Signed)
 Office Visit Note   Patient: Isabel Barnes           Date of Birth: Sep 02, 1941           MRN: [id number] Visit Date: 10/29/2024              Requested by: Hartwell Area, PA-C [address] [place],  [place] [address] PCP: Hartwell Area, PA-C  Chief Complaint  Patient presents with   Right Leg - Follow-up   Left Leg - Follow-up   Right Foot - Wound Check      HPI: Discussed the use of AI scribe software for clinical note transcription with the patient, who gave verbal consent to proceed.  History of Present Illness Isabel Barnes is an 83 year old female who presents for follow-up after hospitalization for cellulitis of the right leg.  Her right leg cellulitis has shown improvement following the application of Vashe and completion of a course of oral antibiotics last Saturday.  She experiences oral irritation, which she attributes to her medications. The sensation is described as 'feeling inside,' and milk provides some soothing relief. This is suspected to be thrush, potentially a side effect from her IV antibiotics.  She continues daily dressing changes with soap, water , and Vashe, followed by gauze and an Ace wrap.     Assessment & Plan: Visit Diagnoses:  1. Thrush, oral   2. Lymphedema   3. 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 Chronic heel pain, right   5. Venous insufficiency (chronic) (peripheral)     Plan: Assessment and Plan Assessment & Plan Chronic right lower leg wound with resolved cellulitis Cellulitis resolved. Wound flat with healthy granulation tissue, 12x9 cm. - Continue daily soap and water  and Vashe dressing changes.  Right heel ulcer (scabbed, no open ulcer) Heel ulcer scabbed, no open ulcer or cellulitis. - Continue current wound care regimen.  Oral thrush secondary to antibiotics Symptomatic oral thrush likely due to IV antibiotics. - Prescribed magic mouthwash.       Follow-Up Instructions: Return in about 2 weeks (around 11/12/2024).   Ortho Exam  Patient is alert, oriented, no adenopathy, well-dressed, normal affect, normal respiratory effort. Physical Exam EXTREMITIES: Cellulitis of the right leg resolved. Wound on right leg flat with healthy granulation tissue, measuring 12x9 cm. Heel ulcer scabbed over, no open ulcer, no cellulitis.      Imaging: No results found. No images are attached to the encounter.  Labs: Lab Results  Component Value Date   ESRSEDRATE 6 01/16/2024   ESRSEDRATE 6 12/19/2023   ESRSEDRATE 40 (H) 02/07/2022   CRP 4.3 (H) 10/21/2024   CRP 10.3 (H) 10/20/2024   CRP 18.5 (H) 10/19/2024   REPTSTATUS 10/22/2024 FINAL 10/17/2024   REPTSTATUS 10/22/2024 FINAL 10/17/2024   GRAMSTAIN  02/10/2022    RARE WBC PRESENT, PREDOMINANTLY PMN NO ORGANISMS SEEN    CULT  10/17/2024    NO GROWTH 5 DAYS Performed at [facility], [address], [place], [place] [address]    CULT  10/17/2024    NO GROWTH 5 DAYS Performed at [facility], [address], [place], [place] [address]    [facility] ENTEROCOCCUS MARIJO 02/10/2022     Lab Results  Component Value Date   ALBUMIN  2.3 (L) 10/22/2024   ALBUMIN  2.3 (L) 10/21/2024   ALBUMIN  2.5 (L) 10/20/2024    Lab Results  Component Value Date   MG 2.3  10/21/2024   MG 2.4 10/20/2024   MG 2.5 (H) 10/19/2024   Lab Results  Component Value Date   VD25OH 69.88 02/07/2022    No results found for: PREALBUMIN    Latest Ref Rng & Units 10/22/2024    3:42 AM 10/21/2024    3:50 AM 10/20/2024    3:48 AM  CBC EXTENDED  WBC 4.0 - 10.5 K/uL 7.7  10.3  19.4   RBC 3.87 - 5.11 MIL/uL 3.57  3.45  3.73   Hemoglobin 12.0 - 15.0 g/dL 9.9  9.7  89.3   HCT 63.9 - 46.0 % 31.2  29.9  31.8   Platelets 150 - 400 K/uL 254  254  276   NEUT# 1.7 - 7.7 K/uL 6.1  8.7  18.2   Lymph# 0.7 - 4.0 K/uL 0.9  0.8  0.5      There is no height or weight on file to calculate  BMI.  Orders:  No orders of the defined types were placed in this encounter.  Meds ordered this encounter  Medications   magic mouthwash     Procedures: No procedures performed  Clinical Data: No additional findings.  ROS:  All other systems negative, except as noted in the HPI. Review of Systems  Objective: Vital Signs: There were no vitals taken for this visit.  Specialty Comments:  No specialty comments available.  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   RLE Wound  R Heel Ulcer 09/08/2023   LLE Cellulitis 09/07/2023   Abscess of right lower leg    Severe protein-calorie malnutrition    Peripheral arterial disease    Fall    Atrial fibrillation (HCC)    Goals of care, counseling/discussion    Anemia    Fluid collection (edema) in the arms, legs, hands and feet    Cellulitis of right lower extremity 02/06/2022   Chest pain, rule out acute myocardial infarction 03/16/2021   Hypertension 03/16/2021   Hypothyroidism 03/16/2021   Hyperlipemia 03/16/2021   Aortic valve stenosis 03/16/2021   Chronic venous insufficiency of lower extremity 03/16/2021   Short-term memory loss 12/13/2019   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   Primary osteoarthritis of right knee 07/16/2016   Eosinophilia 06/16/2015   Chronic pain disorder    Osteoarthritis    Incisional umbilical hernia, without obstruction or gangrene    Iron deficiency anemia 09/19/2012   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   AKI (acute kidney injury) 09/08/2023    Anemia    iron deficiency hx.has had iron infusions before    Chronic low back pain    Chronic pain disorder    Complication of anesthesia    severe claustrophobia   Constipation    r/t use of pain meds.Takes OTC meds or eats prunes   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   Depression    GERD (gastroesophageal reflux disease)    takes Omeprazole daily   Heart murmur    mild MS, moderate-severe AS 03/17/21 echo  History of bronchitis    20+ yrs ago   History of kidney stones    3 surgerical removed, 1 passed   History of prolapse of bladder    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 Albuterol  inhaler as needed   Spinal headache 1991   blood patch placed   Spondylitis    Unsteady gait    occasionally   Urinary urgency     Family History  Problem Relation Age of Onset   Stroke Father     Past Surgical History:  Procedure Laterality Date   ABDOMINAL AORTOGRAM W/LOWER EXTREMITY N/A 02/15/2022   Procedure: ABDOMINAL AORTOGRAM W/LOWER EXTREMITY;  Surgeon: Sheree Penne Bruckner, MD;  Location[facility] INVASIVE CV LAB;  Service: Cardiovascular;  Laterality: N/A;   ABDOMINAL AORTOGRAM W/LOWER EXTREMITY N/A 09/12/2023   Procedure: ABDOMINAL AORTOGRAM W/LOWER EXTREMITY;  Surgeon: Lanis Fonda BRAVO, MD;  Location: [facility] INVASIVE CV LAB;  Service: Cardiovascular;  Laterality: N/A;   ABDOMINAL HYSTERECTOMY     ANTERIOR FUSION CERVICAL SPINE     x2 -C4-7   APPLICATION OF WOUND VAC Right 03/12/2022   Procedure: APPLICATION OF WOUND VAC;  Surgeon:  Harden Jerona GAILS, MD;  Location: [facility] OR;  Service: Orthopedics;  Laterality: Right;   BUNIONECTOMY Bilateral    COLONOSCOPY     CYSTOSCOPY W/ URETEROSCOPY  2012   EYE SURGERY Bilateral    cataract /lens implant   HOLMIUM LASER APPLICATION Left 02/08/2013   Procedure: HOLMIUM LASER APPLICATION;  Surgeon: Norleen JINNY Seltzer, MD;  Location[facility];  Service: Urology;  Laterality: Left;   I & D EXTREMITY Right 02/10/2022   Procedure: DEBRIDEMENT RIGHT LEG ABSCESS;  Surgeon: Harden Jerona GAILS, MD;  Location: [facility] OR;  Service: Orthopedics;  Laterality: Right;   I & D EXTREMITY Right 03/12/2022   Procedure: RIGHT LEG IRRIGATION AND DEBRIDEMENT EXTREMITY;  Surgeon: Harden Jerona GAILS, MD;  Location: [facility] OR;  Service: Orthopedics;  Laterality: Right;   INSERTION OF MESH N/A 07/15/2014   Procedure: INSERTION OF MESH;  Surgeon: Elspeth KYM Schultze, MD;  Location: [facility] OR;  Service: General;  Laterality: N/A;   JOINT REPLACEMENT Right 2012   shoulder   LAPAROSCOPIC CHOLECYSTECTOMY W/ CHOLANGIOGRAPHY  2012   Dr Vernetta   NASAL SINUS SURGERY     OPEN REDUCTION INTERNAL FIXATION (ORIF) DISTAL RADIAL FRACTURE Left 03/20/2021   Procedure: OPEN REDUCTION INTERNAL FIXATION (ORIF) DISTAL RADIAL FRACTURE;  Surgeon: Josefina Fonda, MD;  Location: [facility] OR;  Service: Orthopedics;  Laterality: Left;   RADIOLOGY WITH ANESTHESIA N/A 05/09/2014   Procedure: ADULT SEDATION WITH ANESTHESIA/MRI CERVICAL SPINE WITHOUT CONTRAST;  Surgeon: Medication Radiologist, MD;  Location: [facility] OR;  Service: Radiology;  Laterality: N/A;  DR. HAWKS/MRI   right knee arthroscopy     d/t meniscal tear   SHOULDER ARTHROSCOPY W/ ROTATOR CUFF REPAIR Bilateral three times each over several yrs   SKIN FULL THICKNESS GRAFT Right 03/12/2022   Procedure: SKIN GRAFT FULL THICKNESS;  Surgeon: Harden Jerona GAILS,  MD;  Location: [facility] OR;  Service: Orthopedics;  Laterality: Right;   SKIN SPLIT GRAFT Right 02/17/2022   Procedure: RIGHT LEG SKIN GRAFT;  Surgeon: Harden Jerona GAILS, MD;  Location: [facility] OR;  Service: Orthopedics;  Laterality: Right;   THUMB ARTHROSCOPY Left    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   Procedure: RIGHT TOTAL KNEE ARTHROPLASTY;  Surgeon: Norleen Gavel, MD;  Location: [facility] OR;  Service: Orthopedics;  Laterality: Right;   UMBILICAL HERNIA REPAIR N/A 07/15/2014   Procedure: LAPAROSCOPIC UMBILICAL AND INFRAUMBILICAL HERNIA;  Surgeon: Elspeth KYM Schultze, MD;  Location: [facility] OR;  Service: General;  Laterality: N/A;   Social History   Occupational History   Not on file  Tobacco Use   Smoking status: Former    Current packs/day: 0.00    Average packs/day: 0.5 packs/day for 2.0 years (1.0 ttl pk-yrs)    Types: Cigarettes    Start date: 02/06/1990    Quit date: 02/07/1992    Years since quitting: 32.7   Smokeless tobacco: Never  Vaping Use   Vaping status: Never Used  Substance and Sexual Activity   Alcohol  use: No   Drug use: No   Sexual activity: Not on file

## 2024-11-05 ENCOUNTER — Encounter: Payer: Self-pay | Admitting: Infectious Disease

## 2024-11-05 DIAGNOSIS — L03116 Cellulitis of left lower limb: Secondary | ICD-10-CM

## 2024-11-05 MED ORDER — AMOXICILLIN-POT CLAVULANATE 875-125 MG PO TABS: 1.0000 | ORAL_TABLET | Freq: Two times a day (BID) | ORAL | 0 refills | Status: DC

## 2024-11-07 ENCOUNTER — Ambulatory Visit: Admitting: Nurse Practitioner

## 2024-11-11 NOTE — Progress Notes (Addendum)
 "  Subjective:  Chief Complaint: follow-up for chronic osteomyelitis  calcaneous, tibia, non healing wound, recurrent cellulitis   [name], female    DOB: [birth year], 83 y.o.   MRN: [id number]  HPI  Discussed the use of AI scribe software for clinical note transcription with the patient, who gave verbal consent to proceed.  History of Present Illness   Isabel Barnes is an 83 year old female with a history of bone infection who presents with recurrent cellulitis and redness in her legs and feet.  She has a history of bone infection at the base of her foot and was recently hospitalized for sepsis. Prior to her admission she developed erythema in her right leg and started on augmentin  but her condition deteriorated and she was found down--presumed syncope for sepsis. She was hospitalized on November 19th. Blood cultures were negative.   She was treated with vancomycin , zosyn  then zosyn  and zyvox  for several days  and seen by my partner Dr. Overton, switched to augmentin  with zyvox .   She had CT of the tibia.fibula showing her known non healing wound on the right, with ventral cortical thickening of the right tibial diaphysis c/w chronic osteomyelitis at this site as well.   [facility] have seen the patient and saw no opportunities for improved blood flow.   The patient has filled out paperwork that includes her desire NOT to receive antibiotics in the hospital if she is hospitalized again.   She then had erythema emerge on both legs and notified our office (I was unaware of her admission in November until today's appt)   I had her start augmentin . She has not worsened clinically but erythema has not resolved.     She has a history of venous insufficiency and a venous ulceration, which complicates her condition. Her left knee, which has severe arthritis, was scheduled for a gel injection, but it was postponed due to the redness in her legs. She has a knee replacement on the  right side and experiences significant pain in her left knee, affecting her mobility.        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   Depression    GERD (gastroesophageal reflux disease)    takes Omeprazole daily   Heart murmur    mild MS, moderate-severe AS 03/17/21 echo   History of bronchitis    20+ yrs ago   History of kidney stones    3 surgerical removed, 1 passed   History of prolapse of bladder    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 Albuterol  inhaler as needed   Spinal headache 1991   blood patch placed   Spondylitis    Unsteady gait    occasionally   Urinary urgency     Past Surgical History:  Procedure Laterality Date   ABDOMINAL AORTOGRAM W/LOWER EXTREMITY N/A 02/15/2022   Procedure: ABDOMINAL AORTOGRAM W/LOWER EXTREMITY;  Surgeon: Sheree Penne Bruckner, MD;  Location: [facility] INVASIVE CV LAB;  Service: Cardiovascular;  Laterality: N/A;   ABDOMINAL AORTOGRAM W/LOWER EXTREMITY N/A 09/12/2023   Procedure: ABDOMINAL AORTOGRAM W/LOWER EXTREMITY;  Surgeon: Lanis Fonda BRAVO, MD;  Location: [facility] INVASIVE CV LAB;  Service: Cardiovascular;  Laterality: N/A;    ABDOMINAL HYSTERECTOMY     ANTERIOR FUSION CERVICAL SPINE     x2 -C4-7   APPLICATION OF WOUND VAC Right 03/12/2022   Procedure: APPLICATION OF WOUND VAC;  Surgeon: Harden Jerona GAILS, MD;  Location: [facility] OR;  Service: Orthopedics;  Laterality: Right;   BUNIONECTOMY Bilateral    COLONOSCOPY     CYSTOSCOPY W/ URETEROSCOPY  2012   EYE SURGERY Bilateral    cataract /lens implant   HOLMIUM LASER APPLICATION Left 02/08/2013   Procedure: HOLMIUM LASER APPLICATION;  Surgeon: Norleen JINNY Seltzer, MD;  Location[facility];  Service: Urology;  Laterality: Left;   I & D EXTREMITY Right 02/10/2022   Procedure: DEBRIDEMENT RIGHT LEG ABSCESS;  Surgeon: Harden Jerona GAILS, MD;  Location: [facility] OR;  Service: Orthopedics;  Laterality: Right;   I & D EXTREMITY Right 03/12/2022   Procedure: RIGHT LEG IRRIGATION AND DEBRIDEMENT EXTREMITY;  Surgeon: Harden Jerona GAILS, MD;  Location: [facility] OR;  Service: Orthopedics;  Laterality: Right;   INSERTION OF MESH N/A 07/15/2014   Procedure: INSERTION OF MESH;  Surgeon: Elspeth KYM Schultze, MD;  Location: [facility] OR;  Service: General;  Laterality: N/A;   JOINT REPLACEMENT Right 2012   shoulder   LAPAROSCOPIC CHOLECYSTECTOMY W/ CHOLANGIOGRAPHY  2012   Dr Vernetta   NASAL SINUS SURGERY     OPEN REDUCTION INTERNAL FIXATION (ORIF) DISTAL RADIAL FRACTURE Left 03/20/2021   Procedure: OPEN REDUCTION INTERNAL FIXATION (ORIF) DISTAL RADIAL FRACTURE;  Surgeon: Josefina Fonda, MD;  Location: [facility] OR;  Service: Orthopedics;  Laterality: Left;   RADIOLOGY WITH ANESTHESIA N/A 05/09/2014   Procedure: ADULT SEDATION WITH ANESTHESIA/MRI CERVICAL SPINE WITHOUT CONTRAST;  Surgeon: Medication Radiologist, MD;  Location: [facility] OR;  Service: Radiology;  Laterality: N/A;  DR. HAWKS/MRI   right knee arthroscopy     d/t meniscal tear   SHOULDER ARTHROSCOPY W/ ROTATOR CUFF REPAIR Bilateral three times each over several yrs   SKIN FULL THICKNESS GRAFT Right 03/12/2022   Procedure: SKIN GRAFT FULL THICKNESS;  Surgeon: Harden Jerona GAILS, MD;  Location: [facility] OR;  Service: Orthopedics;  Laterality: Right;   SKIN SPLIT GRAFT Right 02/17/2022   Procedure: RIGHT LEG SKIN GRAFT;  Surgeon: Harden Jerona GAILS, MD;  Location: [facility] OR;  Service: Orthopedics;  Laterality: Right;   THUMB ARTHROSCOPY Left    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   Procedure: RIGHT TOTAL KNEE ARTHROPLASTY;  Surgeon: Norleen Gavel, MD;  Location: [facility] OR;  Service: Orthopedics;  Laterality: Right;   UMBILICAL HERNIA REPAIR N/A 07/15/2014   Procedure: LAPAROSCOPIC UMBILICAL AND INFRAUMBILICAL HERNIA;  Surgeon: Elspeth KYM Schultze, MD;  Location: [facility] OR;  Service: General;  Laterality: N/A;    Family History  Problem Relation Age of Onset   Stroke Father  Social History   Socioeconomic History   Marital status: Divorced    Spouse name: Not on file   Number of children: Not on file   Years of education: Not on file   Highest education level: Not on file  Occupational History   Not on file  Tobacco Use   Smoking status: Former    Current packs/day: 0.00    Average packs/day: 0.5 packs/day for 2.0 years (1.0 ttl pk-yrs)    Types: Cigarettes    Start date: 02/06/1990    Quit date: 02/07/1992    Years since quitting: 32.7   Smokeless tobacco: Never  Vaping Use   Vaping status: Never Used  Substance and Sexual Activity   Alcohol  use: No   Drug use: No   Sexual activity: Not on file  Other Topics Concern   Not on file  Social History Narrative   Not on file   Social Drivers of Health   Tobacco Use: Medium Risk (11/01/2024)   Received from [facility]   Patient History    Smoking Tobacco Use: Former    Smokeless Tobacco Use: Never    Passive Exposure: Not on file  Financial Resource Strain: Low Risk (05/25/2024)   Received from [facility]   Overall Financial Resource Strain (CARDIA)    Difficulty of Paying Living Expenses: Not hard at all  Food Insecurity: No Food Insecurity (10/19/2024)   Epic    Worried About Radiation Protection Practitioner of Food  in the Last Year: Never true    Ran Out of Food in the Last Year: Never true  Transportation Needs: No Transportation Needs (10/19/2024)   Epic    Lack of Transportation (Medical): No    Lack of Transportation (Non-Medical): No  Physical Activity: Unknown (05/25/2024)   Received from [facility]   Exercise Vital Sign    On average, how many days per week do you engage in moderate to strenuous exercise (like a brisk walk)?: 0 days    Minutes of Exercise per Session: Not on file  Stress: No Stress Concern Present (05/25/2024)   Received from [facility] - Occupational Stress Questionnaire    Feeling of Stress : Only a little  Social Connections: Patient Declined (10/19/2024)   Social Connection and Isolation Panel    Frequency of Communication with Friends and Family: Patient declined    Frequency of Social Gatherings with Friends and Family: Patient declined    Attends Religious Services: Patient declined    Active Member of Clubs or Organizations: Patient declined    Attends Banker Meetings: Patient declined    Marital Status: Patient declined  Depression (PHQ2-9): Low Risk (12/28/2023)   Depression (PHQ2-9)    PHQ-2 Score: 1  Alcohol  Screen: Not on file  Housing: Low Risk (10/19/2024)   Epic    Unable to Pay for Housing in the Last Year: No    Number of Times Moved in the Last Year: 0    Homeless in the Last Year: No  Utilities: Not At Risk (10/19/2024)   Epic    Threatened with loss of utilities: No  Health Literacy: Not on file    Allergies[1]  Current Medications[2]   Review of Systems  Constitutional:  Positive for appetite change and fatigue. Negative for activity change, chills, diaphoresis, fever and unexpected weight change.  HENT:  Negative for congestion, rhinorrhea, sinus pressure, sneezing, sore throat and trouble swallowing.   Eyes:  Negative for photophobia and  visual disturbance.  Respiratory:   Negative for cough, chest tightness, shortness of breath, wheezing and stridor.   Cardiovascular:  Negative for chest pain, palpitations and leg swelling.  Gastrointestinal:  Negative for abdominal distention, abdominal pain, anal bleeding, blood in stool, constipation, diarrhea, nausea and vomiting.  Genitourinary:  Negative for difficulty urinating, dysuria, flank pain and hematuria.  Musculoskeletal:  Positive for arthralgias and myalgias. Negative for back pain, gait problem and joint swelling.  Skin:  Positive for wound. Negative for color change, pallor and rash.  Neurological:  Negative for dizziness, tremors, weakness and light-headedness.  Hematological:  Negative for adenopathy. Does not bruise/bleed easily.  Psychiatric/Behavioral:  Negative for agitation, behavioral problems, confusion, decreased concentration, dysphoric mood and sleep disturbance.        Objective:   Physical Exam Constitutional:      General: She is not in acute distress.    Appearance: She is well-developed. She is not diaphoretic.  HENT:     Head: Normocephalic and atraumatic.     Mouth/Throat:     Pharynx: No oropharyngeal exudate.  Eyes:     General: No scleral icterus.    Conjunctiva/sclera: Conjunctivae normal.  Cardiovascular:     Rate and Rhythm: Normal rate and regular rhythm.  Pulmonary:     Effort: Pulmonary effort is normal. No respiratory distress.     Breath sounds: No wheezing.  Abdominal:     General: There is no distension.  Musculoskeletal:     Cervical back: Normal range of motion and neck supple.  Skin:    General: Skin is warm and dry.     Coloration: Skin is not pale.     Findings: Erythema present. No rash.  Neurological:     General: No focal deficit present.     Mental Status: She is alert and oriented to person, place, and time.     Motor: No abnormal muscle tone.     Coordination: Coordination normal.  Psychiatric:        Mood and Affect: Mood normal.         Behavior: Behavior normal.        Thought Content: Thought content normal.        Judgment: Judgment normal.     Right leg  10/18/24     11/12/24    Right heel  10/18/24:    11/12/24     Left leg   10/18/24:  No pictures available  11/12/24        Assessment & Plan:   Assessment and Plan    Calcaneal and tibial osteomyelitis:   Neither of this are curable absent a proximal amputation which might need to be BKA or even aka   Without overt bacteremia or dramatic findings on imaging it is hard to know that her sepsis originated from her osteomyelitis vs being truly a soft tissue issue though the skin peeling at the site of known osteomyelitis is worrisome   check ESR, CRpCMP and CBC w differential   Recurrent cellulitis of the lower extremities Recurrent cellulitis with bilateral redness and peeling, particularly on the heel. Recent treatment with Augmentin  was ineffective at preventing her hospitalization and while stable her erythema has not resolved   - Added doxycycline  for ten days to cover MRSA. while continuing augmentin  - Prescribed Zyvox  for emergency use in severe cases. - Provided ondansetron  for potential stomach upset from antibiotics. --if we KNEW a culprit organism or had a go to antibiotic we could use I would be  supportive of having her on chronic oral therapy but right now I do not see a clear answer to what would be best used   Goals of care: see above re not wanting IV abx if hospitalized.          [1]  Allergies Allergen Reactions   Dilaudid  [Hydromorphone  Hcl] Shortness Of Breath   Gabapentin Other (See Comments)    Hoarseness , headache and sore throat   Latex Rash    Severe rash   Lyrica [Pregabalin] Other (See Comments)    No balance , had to walk with cane , Blurred vision,weakness.   Oxycodone  Shortness Of Breath and Other (See Comments)    Tolerated oxycodone  during admission to hospital 02/06/22    Singulair [Montelukast] Shortness Of Breath and Other (See Comments)    Vision issues, also   Ciprofloxacin Hcl Nausea And Vomiting and Other (See Comments)    Nausea and vomiting with by mouth form   Codeine Other (See Comments)    Hallucinations   Methadone Nausea And Vomiting and Other (See Comments)    Severe nausea and vomiting   Metronidazole  Nausea And Vomiting and Other (See Comments)    Gastric pain   Oysters [Shellfish Allergy] Other (See Comments)    Terrible gastric upset and cramping.   Clindamycin /Lincomycin Diarrhea and Nausea Only   Donepezil Diarrhea and Other (See Comments)    Severe diarrhea   Ferrous Sulfate      Severe gi upset, will get an infusion when needed per caregiver   Sulfa Antibiotics Diarrhea and Other (See Comments)    GI issues    Tape Other (See Comments)    ADHESIVE TAPE-Severe rash   Zetia [Ezetimibe] Diarrhea   Elemental Sulfur Nausea And Vomiting   Iodine  Rash    Duplicate entry   Other Rash    All Antibiotic ointments/ creams   Oyster Shell Rash   Povidone Iodine  Rash and Other (See Comments)    Oyster shell products- Rash    Povidone-Iodine      Duplicate entry   Skintegrity Hydrogel [Skin Protectants, Misc.] Rash   Tapentadol Other (See Comments)    Nightmares **Nucynta**  [2]  Current Outpatient Medications:    amoxicillin -clavulanate (AUGMENTIN ) 875-125 MG tablet, Take 1 tablet by mouth 2 (two) times daily for 14 days., Disp: 28 tablet, Rfl: 0   aspirin  EC 81 MG tablet, Take 1 tablet (81 mg total) by mouth daily. Swallow whole. (Patient not taking: Reported on 10/17/2024), Disp: 30 tablet, Rfl: 12   Calcium -Vitamin D -Vitamin K ([facility] CALCIUM  CHEWS PO), Take 1 tablet by mouth daily., Disp: , Rfl:    carvedilol  (COREG ) 3.125 MG tablet, Take 1 tablet (3.125 mg total) by mouth 2 (two) times daily with a meal., Disp: 60 tablet, Rfl: 0   cholecalciferol  (VITAMIN D ) 1000 UNITS tablet, Take 1,000 Units by mouth daily., Disp: , Rfl:     diclofenac  sodium (VOLTAREN ) 1 % GEL, Apply 2 g topically daily as needed (for knee pain)., Disp: , Rfl:    diphenhydrAMINE  (BENADRYL ) 25 MG tablet, Take 25 mg by mouth at bedtime as needed for allergies or sleep., Disp: , Rfl:    HYDROcodone -acetaminophen  (NORCO) 10-325 MG tablet, Take 1 tablet by mouth every 6 (six) hours. 0400, 1000, 1600, 2200, Disp: , Rfl:    hydrOXYzine  (ATARAX ) 25 MG tablet, Take 25 mg by mouth 3 (three) times daily as needed for anxiety., Disp: , Rfl:    Lactobacillus (PROBIOTIC ACIDOPHILUS PO), Take 1 capsule by mouth  daily., Disp: , Rfl:    levothyroxine  (SYNTHROID ) 75 MCG tablet, Take 75 mcg by mouth daily., Disp: , Rfl:    loperamide  (IMODIUM  A-D) 2 MG tablet, Take 4 mg by mouth 3 (three) times daily as needed for diarrhea or loose stools., Disp: , Rfl:    magic mouthwash (lidocaine , diphenhydrAMINE , alum & mag hydroxide) suspension, Swish and spit 5 mLs 3 (three) times daily as needed for mouth pain., Disp: 360 mL, Rfl: 0   Melatonin 5 MG CHEW, Chew 10 mg by mouth at bedtime., Disp: , Rfl:    methocarbamol  (ROBAXIN ) 500 MG tablet, Take 500 mg by mouth daily as needed for muscle spasms., Disp: , Rfl:    Multiple Vitamins-Minerals (MULTIVITAMIN PO), Take 1 tablet by mouth daily., Disp: , Rfl:    ondansetron  (ZOFRAN ) 4 MG tablet, Take 1 tablet (4 mg total) by mouth 2 (two) times daily. Take before each dose of augmentin  and can take again if needed up to 3 doses per day (Patient taking differently: Take 4 mg by mouth 2 (two) times daily as needed for nausea or vomiting. Take before each dose of augmentin  and can take again if needed up to 3 doses per day), Disp: 90 tablet, Rfl: 3   predniSONE  (DELTASONE ) 10 MG tablet, TAKE 1 TABLET (10 MG TOTAL) BY MOUTH DAILY WITH BREAKFAST. (Patient not taking: Reported on 10/17/2024), Disp: 30 tablet, Rfl: 0   RESTASIS  0.05 % ophthalmic emulsion, Place 1 drop into both eyes daily., Disp: 0.4 mL, Rfl: 0   rosuvastatin  (CRESTOR ) 10 MG  tablet, Take 1 tablet (10 mg total) by mouth daily., Disp: 30 tablet, Rfl: 0   Wound Cleansers (VASHE WOUND THERAPY EX), Apply 1 application  topically daily. Given samples by provider, Disp: , Rfl:   "

## 2024-11-12 ENCOUNTER — Other Ambulatory Visit: Payer: Self-pay

## 2024-11-12 ENCOUNTER — Ambulatory Visit: Admitting: Infectious Disease

## 2024-11-12 ENCOUNTER — Encounter: Payer: Self-pay | Admitting: Infectious Disease

## 2024-11-12 VITALS — BP 131/58 | HR 91 | Temp 97.9°F

## 2024-11-12 DIAGNOSIS — M86171 Other acute osteomyelitis, right ankle and foot: Secondary | ICD-10-CM | POA: Diagnosis not present

## 2024-11-12 DIAGNOSIS — I739 Peripheral vascular disease, unspecified: Secondary | ICD-10-CM

## 2024-11-12 DIAGNOSIS — L0390 Cellulitis, unspecified: Secondary | ICD-10-CM

## 2024-11-12 DIAGNOSIS — A419 Sepsis, unspecified organism: Secondary | ICD-10-CM

## 2024-11-12 DIAGNOSIS — I878 Other specified disorders of veins: Secondary | ICD-10-CM

## 2024-11-12 DIAGNOSIS — S81802A Unspecified open wound, left lower leg, initial encounter: Secondary | ICD-10-CM

## 2024-11-12 DIAGNOSIS — M069 Rheumatoid arthritis, unspecified: Secondary | ICD-10-CM

## 2024-11-12 DIAGNOSIS — M86361 Chronic multifocal osteomyelitis, right tibia and fibula: Secondary | ICD-10-CM | POA: Diagnosis not present

## 2024-11-12 DIAGNOSIS — L03116 Cellulitis of left lower limb: Secondary | ICD-10-CM | POA: Diagnosis not present

## 2024-11-12 DIAGNOSIS — R6521 Severe sepsis with septic shock: Secondary | ICD-10-CM | POA: Diagnosis not present

## 2024-11-12 MED ORDER — AMOXICILLIN-POT CLAVULANATE 875-125 MG PO TABS: 1.0000 | ORAL_TABLET | Freq: Two times a day (BID) | ORAL | 4 refills | Status: AC

## 2024-11-12 MED ORDER — DOXYCYCLINE HYCLATE 100 MG PO TABS: 100.0000 mg | ORAL_TABLET | Freq: Two times a day (BID) | ORAL | 3 refills | Status: DC

## 2024-11-12 MED ORDER — LINEZOLID 600 MG PO TABS: 600.0000 mg | ORAL_TABLET | Freq: Two times a day (BID) | ORAL | 1 refills | Status: AC

## 2024-11-12 MED ORDER — ONDANSETRON HCL 4 MG PO TABS: 4.0000 mg | ORAL_TABLET | Freq: Two times a day (BID) | ORAL | 3 refills | Status: AC

## 2024-11-13 LAB — SEDIMENTATION RATE: Sed Rate: 9 mm/h (ref 0–30)

## 2024-11-13 LAB — CBC WITH DIFFERENTIAL/PLATELET
Absolute Lymphocytes: 800 {cells}/uL — ABNORMAL LOW (ref 850–3900)
Absolute Monocytes: 670 {cells}/uL (ref 200–950)
Basophils Absolute: 91 {cells}/uL (ref 0–200)
Basophils Relative: 1.4 %
Eosinophils Absolute: 1378 {cells}/uL — ABNORMAL HIGH (ref 15–500)
Eosinophils Relative: 21.2 %
HCT: 30.9 % — ABNORMAL LOW (ref 35.9–46.0)
Hemoglobin: 10.1 g/dL — ABNORMAL LOW (ref 11.7–15.5)
MCH: 27.7 pg (ref 27.0–33.0)
MCHC: 32.7 g/dL (ref 31.6–35.4)
MCV: 84.7 fL (ref 81.4–101.7)
MPV: 10.4 fL (ref 7.5–12.5)
Monocytes Relative: 10.3 %
Neutro Abs: 3562 {cells}/uL (ref 1500–7800)
Neutrophils Relative %: 54.8 %
Platelets: 364 Thousand/uL (ref 140–400)
RBC: 3.65 Million/uL — ABNORMAL LOW (ref 3.80–5.10)
RDW: 13.2 % (ref 11.0–15.0)
Total Lymphocyte: 12.3 %
WBC: 6.5 Thousand/uL (ref 3.8–10.8)

## 2024-11-13 LAB — COMPLETE METABOLIC PANEL WITHOUT GFR
AG Ratio: 1.7 (calc) (ref 1.0–2.5)
ALT: 15 U/L (ref 6–29)
AST: 27 U/L (ref 10–35)
Albumin: 3.7 g/dL (ref 3.6–5.1)
Alkaline phosphatase (APISO): 88 U/L (ref 37–153)
BUN: 14 mg/dL (ref 7–25)
CO2: 29 mmol/L (ref 20–32)
Calcium: 9 mg/dL (ref 8.6–10.4)
Chloride: 103 mmol/L (ref 98–110)
Creat: 0.88 mg/dL (ref 0.60–0.95)
Globulin: 2.2 g/dL (ref 1.9–3.7)
Glucose, Bld: 128 mg/dL — ABNORMAL HIGH (ref 65–99)
Potassium: 3.3 mmol/L — ABNORMAL LOW (ref 3.5–5.3)
Sodium: 141 mmol/L (ref 135–146)
Total Bilirubin: 0.8 mg/dL (ref 0.2–1.2)
Total Protein: 5.9 g/dL — ABNORMAL LOW (ref 6.1–8.1)

## 2024-11-13 LAB — C-REACTIVE PROTEIN: CRP: 69.2 mg/L — ABNORMAL HIGH (ref <8.0)

## 2024-11-14 ENCOUNTER — Encounter: Payer: Self-pay | Admitting: Infectious Disease

## 2024-11-15 ENCOUNTER — Ambulatory Visit: Admitting: Orthopedic Surgery

## 2024-11-15 DIAGNOSIS — L97421 Non-pressure chronic ulcer of left heel and midfoot limited to breakdown of skin: Secondary | ICD-10-CM

## 2024-11-15 DIAGNOSIS — I872 Venous insufficiency (chronic) (peripheral): Secondary | ICD-10-CM | POA: Diagnosis not present

## 2024-11-15 DIAGNOSIS — M79671 Pain in right foot: Secondary | ICD-10-CM

## 2024-11-15 DIAGNOSIS — G8929 Other chronic pain: Secondary | ICD-10-CM

## 2024-11-15 DIAGNOSIS — I890 Lymphedema, not elsewhere classified: Secondary | ICD-10-CM | POA: Diagnosis not present

## 2024-11-16 ENCOUNTER — Encounter: Payer: Self-pay | Admitting: Orthopedic Surgery

## 2024-11-16 NOTE — Progress Notes (Signed)
 "  Office Visit Note   Patient: Isabel Barnes           Date of Birth: [birth year]           MRN: [id number] Visit Date: 11/15/2024              Requested by: Hartwell Area, PA-C [address] [place],  [place] [address] PCP: Hartwell Area, PA-C  Chief Complaint  Patient presents with   Right Leg - Follow-up   Left Leg - Follow-up      HPI: Discussed the use of AI scribe software for clinical note transcription with the patient, who gave verbal consent to proceed.  History of Present Illness Isabel Barnes is an 83 year old female who presents with heel pain and concerns about cellulitis.  She experiences heel pain with peeling skin, raising concerns about cellulitis. The heel is described as 'really sore', and she cannot walk on it without padding. The wound on her heel has been present for a long time, and she describes it as 'ugly' but acknowledges it looks healthy. She is currently using Vashe for dressing changes and has been applying it to the wound and heel.  She has a history of elevated CRP levels, with a recent lab showing a CRP of 69.2. Her white blood cell count is normal at 6.5. She is currently on antibiotics, including amoxicillin  and doxycycline , and has a backup prescription for Zometa, which she did not purchase due to cost.  She experiences increased lymphedema in her right leg, which is more pronounced than in her left leg. The swelling has been persistent, and she has been using leg pumps at home when time allows. The leg is often 'running a temperature' and 'really hot', which she attributes to dermatitis from the swelling.  She has been using Vashe and four by four gauze for wound care, which she finds effective and simple. She has not refilled her Silvadene  cream prescription, opting to continue with Vashe instead.     Assessment & Plan: Visit Diagnoses: No diagnosis found.  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 Tenderness and soft tissue collapse noted. Elevated CRP due to arthritis, not infection. No cellulitis. - Continue Vashe dressing changes. - Re-evaluate in four weeks.  Chronic lymphedema of right lower extremity Chronic lymphedema in right lower extremity with increased swelling, no cellulitis. Swelling contributes to dermatitis and redness. Managed with elevation and compression. - Continue leg elevation and compression. - Perform leg pumps regularly.      Follow-Up Instructions: No follow-ups on file.   Ortho Exam  Patient is alert, oriented, no adenopathy, well-dressed, normal affect, normal respiratory effort. Physical Exam ABDOMEN: Wound with flat, healthy granulation tissue, measuring 11x7 cm. EXTREMITIES: Increased lymphedema in right leg, no cellulitis, odor, or drainage. Tenderness over heel with skin and calcaneus, complete collapse of soft tissue padding. No signs of infection in the heel.      Imaging: No results found. No images are attached to the encounter.  Labs: Lab Results  Component Value Date   ESRSEDRATE 9 11/12/2024   ESRSEDRATE 6 01/16/2024   ESRSEDRATE 6 12/19/2023   CRP 69.2 (H) 11/12/2024   CRP 4.3 (H) 10/21/2024   CRP 10.3 (H) 10/20/2024   REPTSTATUS 10/22/2024 FINAL 10/17/2024   REPTSTATUS 10/22/2024 FINAL 10/17/2024   GRAMSTAIN  02/10/2022    RARE WBC PRESENT, PREDOMINANTLY PMN NO ORGANISMS SEEN    CULT  10/17/2024    NO GROWTH 5 DAYS Performed at [facility], [address], [place], [place] [address]    CULT  10/17/2024    NO GROWTH 5 DAYS Performed at [facility], [address] [place], [place] [address]    [facility] ENTEROCOCCUS FAECALIS 02/10/2022     Lab Results  Component Value Date   ALBUMIN  2.3 (L) 10/22/2024   ALBUMIN  2.3 (L) 10/21/2024   ALBUMIN  2.5 (L)  10/20/2024    Lab Results  Component Value Date   MG 2.3 10/21/2024   MG 2.4 10/20/2024   MG 2.5 (H) 10/19/2024   Lab Results  Component Value Date   VD25OH 69.88 02/07/2022    No results found for: PREALBUMIN    Latest Ref Rng & Units 11/12/2024   11:31 AM 10/22/2024    3:42 AM 10/21/2024    3:50 AM  CBC EXTENDED  WBC 3.8 - 10.8 Thousand/uL 6.5  7.7  10.3   RBC 3.80 - 5.10 Million/uL 3.65  3.57  3.45   Hemoglobin 11.7 - 15.5 g/dL 89.8  9.9  9.7   HCT 64.0 - 46.0 % 30.9  31.2  29.9   Platelets 140 - 400 Thousand/uL 364  254  254   NEUT# 1,500 - 7,800 cells/uL 3,562  6.1  8.7   Lymph# 0.7 - 4.0 K/uL  0.9  0.8      There is no height or weight on file to calculate BMI.  Orders:  No orders of the defined types were placed in this encounter.  No orders of the defined types were placed in this encounter.    Procedures: No procedures performed  Clinical Data: No additional findings.  ROS:  All other systems negative, except as noted in the HPI. Review of Systems  Objective: Vital Signs: There were no vitals taken for this visit.  Specialty Comments:  No specialty comments available.  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   RLE Wound  R Heel Ulcer 09/08/2023   LLE Cellulitis 09/07/2023   Abscess of right lower leg    Severe protein-calorie malnutrition    Peripheral arterial disease    Fall    Atrial fibrillation (HCC)    Goals of care, counseling/discussion    Anemia    Fluid collection (edema) in the arms, legs, hands and feet    Cellulitis of right lower  extremity 02/06/2022   Chest pain, rule out acute myocardial infarction 03/16/2021   Hypertension 03/16/2021   Hypothyroidism 03/16/2021   Hyperlipemia 03/16/2021   Aortic valve stenosis 03/16/2021   Chronic venous insufficiency of lower extremity 03/16/2021   Short-term memory loss 12/13/2019   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   Primary osteoarthritis of right knee 07/16/2016   Eosinophilia 06/16/2015   Chronic pain disorder    Osteoarthritis    Incisional umbilical hernia, without obstruction or gangrene    Iron deficiency anemia 09/19/2012   Past Medical History:  Diagnosis Date   Acute osteomyelitis of right calcaneus (HCC)  12/18/2023   AKI (acute kidney injury) 09/08/2023   Anemia    iron deficiency hx.has had iron infusions before    Chronic low back pain    Chronic pain disorder    Complication of anesthesia    severe claustrophobia   Constipation    r/t use of pain meds.Takes OTC meds or eats prunes   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   Depression    GERD (gastroesophageal reflux disease)    takes Omeprazole daily   Heart murmur    mild MS, moderate-severe AS 03/17/21 echo   History of bronchitis    20+ yrs ago   History of kidney stones    3 surgerical removed, 1 passed   History of prolapse of bladder    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 Albuterol  inhaler as needed   Spinal headache 1991   blood patch placed   Spondylitis    Unsteady gait    occasionally   Urinary urgency     Family History   Problem Relation Age of Onset   Stroke Father     Past Surgical History:  Procedure Laterality Date   ABDOMINAL AORTOGRAM W/LOWER EXTREMITY N/A 02/15/2022   Procedure: ABDOMINAL AORTOGRAM W/LOWER EXTREMITY;  Surgeon: Sheree Penne Bruckner, MD;  Location: [facility] INVASIVE CV LAB;  Service: Cardiovascular;  Laterality: N/A;   ABDOMINAL AORTOGRAM W/LOWER EXTREMITY N/A 09/12/2023   Procedure: ABDOMINAL AORTOGRAM W/LOWER EXTREMITY;  Surgeon: Lanis Fonda BRAVO, MD;  Location: [facility] INVASIVE CV LAB;  Service: Cardiovascular;  Laterality: N/A;   ABDOMINAL HYSTERECTOMY     ANTERIOR FUSION CERVICAL SPINE     x2 -C4-7   APPLICATION OF WOUND VAC Right 03/12/2022   Procedure: APPLICATION OF WOUND VAC;  Surgeon: Harden Jerona GAILS, MD;  Location: [facility] OR;  Service: Orthopedics;  Laterality: Right;   BUNIONECTOMY Bilateral    COLONOSCOPY     CYSTOSCOPY W/ URETEROSCOPY  2012   EYE SURGERY Bilateral    cataract /lens implant   HOLMIUM LASER APPLICATION Left 02/08/2013   Procedure: HOLMIUM LASER APPLICATION;  Surgeon: Norleen JINNY Seltzer, MD;  Location: [facility];  Service: Urology;  Laterality: Left;   I & D EXTREMITY Right 02/10/2022   Procedure: DEBRIDEMENT RIGHT LEG ABSCESS;  Surgeon: Harden Jerona GAILS, MD;  Location: [facility] OR;  Service: Orthopedics;  Laterality: Right;   I & D EXTREMITY Right 03/12/2022   Procedure: RIGHT LEG IRRIGATION AND DEBRIDEMENT EXTREMITY;  Surgeon: Harden Jerona GAILS, MD;  Location: [facility] OR;  Service: Orthopedics;  Laterality: Right;   INSERTION OF MESH N/A 07/15/2014   Procedure: INSERTION OF MESH;  Surgeon: Elspeth KYM Schultze, MD;  Location: [facility] OR;  Service: General;  Laterality: N/A;   JOINT REPLACEMENT Right 2012   shoulder   LAPAROSCOPIC CHOLECYSTECTOMY W/ CHOLANGIOGRAPHY  2012   Dr Vernetta   NASAL SINUS SURGERY     OPEN REDUCTION INTERNAL FIXATION (ORIF) DISTAL RADIAL FRACTURE  Left 03/20/2021   Procedure: OPEN REDUCTION INTERNAL FIXATION (ORIF) DISTAL RADIAL FRACTURE;  Surgeon: Josefina Chew, MD;  Location: [facility] OR;  Service: Orthopedics;  Laterality: Left;   RADIOLOGY WITH ANESTHESIA N/A 05/09/2014   Procedure: ADULT SEDATION WITH ANESTHESIA/MRI CERVICAL SPINE WITHOUT CONTRAST;  Surgeon: Medication Radiologist, MD;  Location: [facility] OR;  Service: Radiology;  Laterality: N/A;  DR. HAWKS/MRI   right knee arthroscopy     d/t meniscal tear   SHOULDER ARTHROSCOPY W/ ROTATOR CUFF REPAIR Bilateral three times each over several yrs   SKIN FULL THICKNESS GRAFT Right 03/12/2022   Procedure: SKIN GRAFT FULL THICKNESS;  Surgeon: Harden Jerona GAILS, MD;  Location[facility] OR;  Service: Orthopedics;  Laterality: Right;   SKIN SPLIT GRAFT Right 02/17/2022   Procedure: RIGHT LEG SKIN GRAFT;  Surgeon: Harden Jerona GAILS, MD;  Location: [facility] OR;  Service: Orthopedics;  Laterality: Right;   THUMB ARTHROSCOPY Left    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   Procedure: RIGHT TOTAL KNEE ARTHROPLASTY;  Surgeon: Norleen Gavel, MD;  Location: [facility] OR;  Service: Orthopedics;  Laterality: Right;   UMBILICAL HERNIA REPAIR N/A 07/15/2014   Procedure: LAPAROSCOPIC UMBILICAL AND INFRAUMBILICAL HERNIA;  Surgeon: Elspeth KYM Schultze, MD;  Location: [facility] OR;  Service: General;  Laterality: N/A;   Social History   Occupational History   Not on file  Tobacco Use   Smoking status: Former    Current packs/day: 0.00    Average packs/day: 0.5 packs/day for 2.0 years (1.0 ttl pk-yrs)    Types: Cigarettes    Start date: 02/06/1990    Quit date: 02/07/1992    Years since quitting: 32.7   Smokeless tobacco: Never  Vaping Use   Vaping status: Never Used  Substance and Sexual Activity   Alcohol  use: No   Drug use: No   Sexual activity: Not on file         "

## 2024-11-26 NOTE — Progress Notes (Unsigned)
 "  Cardiology Office Note    Date:  11/28/2024  ID:  Isabel, Barnes 12/05/1940, MRN [id number] PCP:  [facility], PA-C  Cardiologist:  Deatrice Cage, MD  Electrophysiologist:  None   Chief Complaint: Follow up for chest pain, aortic stenosis   History of Present Illness: .   Isabel Barnes is a pleasant 83 y.o. 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  Prior SPECT stress in 2022 was low risk with borderline 3 times daily without qualitative dilation and in 2022.  Cardiology was consulted on 10/17/2024 at request of hospitalist team for evaluation of NSTEMI.  Patient had suffered multiple falls at home and was brought to the ED via EMS.  She is found to be septic from lower extremity cellulitis and admitted to medicine service.  She was given IV fluids per sepsis protocol and broad-spectrum antibiotics.  She then became short of breath and was found to have interstitial opacities, was given IV Lasix .  Troponin was elevated at 4245>>4193.  She was started on aspirin  and heparin .  It was felt that elevated troponin was due to demand ischemia in setting of multiple falls, sepsis and volume overload from IV fluids.  EKG showed normal sinus rhythm with nonspecific ST changes.  Echocardiogram during admission showed preserved EF without regional wall motion abnormalities.  Patient was able to ambulate without chest pain or worsening shortness of breath, noted to have chronic shortness of breath.  Patient was discharged in stable condition 10/23/2024.  Today she presents for follow-up with her caregiver, Burnard.  She reports that she has been doing well overall.  She denies any chest pain, increased shortness of breath, orthopnea or PND.  Patient walks around her house, denies increased shortness of breath with this, notes after walking  will sit and take a deep breath.  She denies any palpitations, presyncope or syncope.  Patient has history of chronic lymphedema with associated swelling, recently completed a short course of Lasix  for increased swelling in her thighs, they note that majority of her swelling does typically occur more so in her thighs.  They deny any significant changes in her swelling however patient notes significant urine output, they use Lasix  as needed as a result of significant urine output which is taxing to the patient.  Patient denies any cardiac concerns or complaints today.  Reviewed patient's echocardiogram from recent admission. ROS: .   Today she denies chest pain, fatigue, palpitations, melena, hematuria, hemoptysis, diaphoresis, presyncope, syncope, orthopnea, and PND.  All other systems are reviewed and otherwise negative. Studies Reviewed: SABRA   EKG:  EKG is ordered today, personally reviewed, demonstrating  EKG Interpretation Date/Time:  Wednesday November 28 2024 11:07:58 EST Ventricular Rate:  71 PR Interval:  122 QRS Duration:  94 QT Interval:  444 QTC Calculation: 482 R Axis:   76  Text Interpretation: Normal sinus rhythm Nonspecific ST abnormality QTc improved Confirmed by Landry Lookingbill [phone]) on 11/28/2024 12:25:00 PM   CV Studies: Cardiac studies reviewed are outlined and summarized above. Otherwise please see EMR for full report. Cardiac Studies & Procedures   ______________________________________________________________________________________________   STRESS TESTS  NM MYOCAR MULTI W/SPECT W 03/17/2021  Narrative  ST segment depression was noted during stress in the II, III, aVF, V4 and V5 leads.  No T wave inversion was noted during stress.  This is a low risk study.  The  left ventricular ejection fraction is hyperdynamic (>65%).  IMPRESSIONS PACs through study. 1 mm horizontal ST depressions with stress as noted above. Hyperdynamic LVEF No perfusion defects There is  small subdiaphragmatic activity seen in the field during rest and stress; no evidence of patient movement. TID of 1.28 reported but without qualitative dilation and with LVEDVi of 36 ml/m2.  RECOMMENDATIONS/CONCLUSIONS Negative stress test for ischemia or infarction.  TID 1.28 as above. The study is consistent with a low risk study.   ECHOCARDIOGRAM  ECHOCARDIOGRAM COMPLETE 10/18/2024  Narrative ECHOCARDIOGRAM REPORT    Patient Name:   Isabel Barnes Date of Exam: 10/18/2024 Medical Rec #:  [id number]         Height:       62.0 in Accession #:    [id number]        Weight:       102.0 lb Date of Birth:  1941-08-15          BSA:          1.436 m Patient Age:    83 years          BP:           154/70 mmHg Patient Gender: F                 HR:           86 bpm. Exam Location:  Inpatient  Procedure: 2D Echo, Cardiac Doppler, Color Doppler and PEDOF (Both Spectral and Color Flow Doppler were utilized during procedure).  Indications:    NSTEMI I21.4  History:        Patient has prior history of Echocardiogram examinations, most recent 02/08/2022. NSTEMI; Risk Factors:Hypertension and Dyslipidemia.  Sonographer:    Koleen Popper RDCS Referring Phys: CURTISTINE LITTIE FARR   Sonographer Comments: Image acquisition challenging due to patient body habitus. IMPRESSIONS   1. Left ventricular ejection fraction, by estimation, is 65 to 70%. The left ventricle has normal function. The left ventricle has no regional wall motion abnormalities. 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 Left ventricular diastolic parameters are indeterminate. Elevated left atrial pressure. 2. Right ventricular systolic function is normal. The right ventricular size is normal. There is severely elevated pulmonary artery systolic pressure. The estimated right ventricular systolic pressure is 61.8 mmHg. 3. Left atrial size was severely dilated. 4. Right atrial size was  severely dilated. 5. The mitral valve is degenerative. Moderate mitral valve regurgitation. No evidence of mitral stenosis. Severe mitral annular calcification. 6. Tricuspid valve regurgitation is moderate to severe. 7. The aortic valve is abnormal. There is severe calcifcation of the aortic valve. Aortic valve regurgitation is not visualized. Moderate aortic valve stenosis. Aortic valve area, by VTI measures 1.50 cm. Aortic valve mean gradient measures 28.0 mmHg. Aortic valve Vmax measures 3.47 m/s. 8. The inferior vena cava is dilated in size with <50% respiratory variability, suggesting right atrial pressure of 15 mmHg.  FINDINGS Left Ventricle: Left ventricular ejection fraction, by estimation, is 65 to 70%. The left ventricle has normal function. The left ventricle has no regional wall motion abnormalities. The left ventricular internal cavity size was normal in size. 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 Left ventricular diastolic parameters are indeterminate. Elevated left atrial pressure.  Right Ventricle: The right ventricular size is normal. No increase in right ventricular wall thickness. Right ventricular systolic function is normal. There is severely elevated pulmonary artery systolic pressure. The tricuspid regurgitant velocity  is 3.42 m/s, and with an assumed right atrial pressure of 15 mmHg, the estimated right ventricular systolic pressure is 61.8 mmHg.  Left Atrium: Left atrial size was severely dilated.  Right Atrium: Right atrial size was severely dilated.  Pericardium: There is no evidence of pericardial effusion.  Mitral Valve: The mitral valve is degenerative in appearance. Severe mitral annular calcification. Moderate mitral valve regurgitation. No evidence of mitral valve stenosis. MV peak gradient, 12.7 mmHg. The mean mitral valve gradient is 4.0 mmHg with average heart rate of 82 bpm.  Tricuspid Valve: The tricuspid  valve is normal in structure. Tricuspid valve regurgitation is moderate to severe. No evidence of tricuspid stenosis.  Aortic Valve: The aortic valve is abnormal. There is severe calcifcation of the aortic valve. Aortic valve regurgitation is not visualized. Moderate aortic stenosis is present. Aortic valve mean gradient measures 28.0 mmHg. Aortic valve peak gradient measures 48.2 mmHg. Aortic valve area, by VTI measures 1.50 cm.  Pulmonic Valve: The pulmonic valve was not well visualized. Pulmonic valve regurgitation is trivial. No evidence of pulmonic stenosis.  Aorta: The aortic root is normal in size and structure.  Venous: The inferior vena cava is dilated in size with less than 50% respiratory variability, suggesting right atrial pressure of 15 mmHg.  IAS/Shunts: No atrial level shunt detected by color flow Doppler.   LEFT VENTRICLE PLAX 2D LVIDd:         4.30 cm   Diastology LVIDs:         2.50 cm   LV e' medial:    7.18 cm/s LV PW:         1.10 cm   LV E/e' medial:  21.0 LV IVS:        1.50 cm   LV e' lateral:   8.67 cm/s LVOT diam:     1.90 cm   LV E/e' lateral: 17.4 LV SV:         107 LV SV Index:   75 LVOT Area:     2.84 cm   RIGHT VENTRICLE             IVC RV Basal diam:  4.20 cm     IVC diam: 2.80 cm RV Mid diam:    2.60 cm RV S prime:     10.50 cm/s TAPSE (M-mode): 2.0 cm  LEFT ATRIUM             Index        RIGHT ATRIUM           Index LA diam:        4.50 cm 3.13 cm/m   RA Area:     21.60 cm LA Vol (A2C):   60.1 ml 41.85 ml/m  RA Volume:   63.00 ml  43.87 ml/m LA Vol (A4C):   67.0 ml 46.66 ml/m LA Biplane Vol: 63.2 ml 44.01 ml/m AORTIC VALVE AV Area (Vmax):    1.51 cm AV Area (Vmean):   1.47 cm AV Area (VTI):     1.50 cm AV Vmax:           347.00 cm/s AV Vmean:          239.000 cm/s AV VTI:            0.713 m AV Peak Grad:      48.2 mmHg AV Mean Grad:      28.0 mmHg LVOT Vmax:         185.00 cm/s LVOT Vmean:  124.000 cm/s LVOT VTI:           0.378 m LVOT/AV VTI ratio: 0.53  AORTA Ao Root diam: 2.75 cm Ao Asc diam:  2.90 cm  MITRAL VALVE                TRICUSPID VALVE MV Area (PHT): 3.95 cm     TR Peak grad:   46.8 mmHg MV Area VTI:   3.27 cm     TR Vmax:        342.00 cm/s MV Peak grad:  12.7 mmHg MV Mean grad:  4.0 mmHg     SHUNTS MV Vmax:       1.78 m/s     Systemic VTI:  0.38 m MV Vmean:      97.4 cm/s    Systemic Diam: 1.90 cm MV Decel Time: 192 msec MV E velocity: 150.50 cm/s MV A velocity: 81.50 cm/s MV E/A ratio:  1.85  Soyla Merck MD Electronically signed by Soyla Merck MD Signature Date/Time: 10/18/2024/11:29:23 AM    Final          ______________________________________________________________________________________________       Current Reported Medications:.    Active Medications[1]  Physical Exam:    VS:  BP (!) 132/52   Pulse 71   Ht 5' (1.524 m)   Wt 138 lb 12.8 oz (63 kg)   SpO2 96%   BMI 27.11 kg/m    Wt Readings from Last 3 Encounters:  11/28/24 138 lb 12.8 oz (63 kg)  10/23/24 147 lb 7.8 oz (66.9 kg)  12/19/23 122 lb (55.3 kg)    GEN: Well nourished, well developed in no acute distress NECK: No JVD; No carotid bruits CARDIAC: RRR, no murmurs, rubs, gallops RESPIRATORY:  Clear to auscultation without rales, wheezing or rhonchi  ABDOMEN: Soft, non-tender, non-distended EXTREMITIES:  No edema; No acute deformity     Asessement and Plan:.    Chest pain: Patient with episode of chest pain with elevated troponin felt to be related to demand ischemia in setting of sepsis and volume overload from IV fluids, EKG showed normal sinus rhythm with nonspecific ST changes.  Echo during admission reassuring with preserved EF with no wall motion abnormalities.  Patient underwent diuresis and has not had any further chest pain, denies any worsening of shortness of breath.  EKG today shows normal sinus rhythm with nonspecific ST abnormality, unchanged compared to prior, QTc  has improved.  Discussed ischemic evaluation with patient and her caregiver, patient without any further symptoms and notes that she would be unlikely to be agreeable to any invasive procedures, will not pursue ischemic evaluations at this time.  Of note patient is currently being followed by palliative care.  Patient aware of ED precautions.  They will notify the office of any recurrent symptoms.  Lymphedema/HF with preserved EF/aortic stenosis: Echocardiogram during admission showed preserved EF without regional wall motion admission aldolase, aortic stenosis noted to be moderate on echcoardiogram during admission, patient denies any increased shortness of breath.  Of note she has chronic lymphedema with chronic bilateral lower extremity swelling and wounds, followed by infectious disease and orthopedic surgery.  She was recently on Lasix  for the prior 4 days with increased urine output however notes no real change in her chronic swelling, she does have bilateral swelling noted today on exam consistent with her lymphedema.  Her caregiver closely monitors and uses Lasix  as needed, encouraged to continue with.  Discussed possible TAVR in the future if her aortic  stenosis were to progress, at this time patient notes that she would likely be uninterested in any invasive procedures and is currently followed by palliative care.   Disposition: F/u with Dr. Darron in six months or sooner if needed.   Signed, Tyrie Porzio D Kadence Mikkelson, NP       [1]  Current Meds  Medication Sig   amoxicillin -clavulanate (AUGMENTIN ) 875-125 MG tablet Take 1 tablet by mouth 2 (two) times daily.   Calcium -Vitamin D -Vitamin K ([employer] CALCIUM  CHEWS PO) Take 1 tablet by mouth daily.   carvedilol  (COREG ) 3.125 MG tablet Take 1 tablet (3.125 mg total) by mouth 2 (two) times daily with a meal.   cholecalciferol  (VITAMIN D ) 1000 UNITS tablet Take 1,000 Units by mouth daily.   diclofenac  sodium (VOLTAREN ) 1 % GEL Apply 2 g topically daily as needed  (for knee pain).   diphenhydrAMINE  (BENADRYL ) 25 MG tablet Take 25 mg by mouth at bedtime as needed for allergies or sleep.   HYDROcodone -acetaminophen  (NORCO) 10-325 MG tablet Take 1 tablet by mouth every 6 (six) hours. 0400, 1000, 1600, 2200   hydrOXYzine  (ATARAX ) 25 MG tablet Take 25 mg by mouth 3 (three) times daily as needed for anxiety.   Lactobacillus (PROBIOTIC ACIDOPHILUS PO) Take 1 capsule by mouth daily.   levothyroxine  (SYNTHROID ) 75 MCG tablet Take 75 mcg by mouth daily.   loperamide  (IMODIUM  A-D) 2 MG tablet Take 4 mg by mouth 3 (three) times daily as needed for diarrhea or loose stools.   Melatonin 5 MG CHEW Chew 10 mg by mouth at bedtime.   methocarbamol  (ROBAXIN ) 500 MG tablet Take 500 mg by mouth daily as needed for muscle spasms.   Multiple Vitamins-Minerals (MULTIVITAMIN PO) Take 1 tablet by mouth daily.   ondansetron  (ZOFRAN ) 4 MG tablet Take 1 tablet (4 mg total) by mouth 2 (two) times daily. Take before each dose of augmentin  and can take again if needed up to 3 doses per day   RESTASIS  0.05 % ophthalmic emulsion Place 1 drop into both eyes daily.   rosuvastatin  (CRESTOR ) 10 MG tablet Take 1 tablet (10 mg total) by mouth daily.   Wound Cleansers (VASHE WOUND THERAPY EX) Apply 1 application  topically daily. Given samples by provider   "

## 2024-11-28 ENCOUNTER — Encounter: Payer: Self-pay | Admitting: Cardiology

## 2024-11-28 ENCOUNTER — Ambulatory Visit: Admitting: Cardiology

## 2024-11-28 VITALS — BP 132/52 | HR 71 | Ht 60.0 in | Wt 138.8 lb

## 2024-11-28 DIAGNOSIS — I872 Venous insufficiency (chronic) (peripheral): Secondary | ICD-10-CM | POA: Diagnosis not present

## 2024-11-28 DIAGNOSIS — I350 Nonrheumatic aortic (valve) stenosis: Secondary | ICD-10-CM | POA: Diagnosis not present

## 2024-11-28 DIAGNOSIS — R079 Chest pain, unspecified: Secondary | ICD-10-CM | POA: Diagnosis not present

## 2024-11-28 DIAGNOSIS — I890 Lymphedema, not elsewhere classified: Secondary | ICD-10-CM | POA: Diagnosis not present

## 2024-11-28 NOTE — Patient Instructions (Signed)
 Medication Instructions:  Your physician recommends that you continue on your current medications as directed. Please refer to the Current Medication list given to you today.  *If you need a refill on your cardiac medications before your next appointment, please call your pharmacy*  Lab Work: NONE  If you have labs (blood work) drawn today and your tests are completely normal, you will receive your results only by: MyChart Message (if you have MyChart) OR A paper copy in the mail If you have any lab test that is abnormal or we need to change your treatment, we will call you to review the results.  Testing/Procedures: NONE  Follow-Up: At [facility], you and your health needs are our priority.  As part of our continuing mission to provide you with exceptional heart care, our providers are all part of one team.  This team includes your primary Cardiologist (physician) and Advanced Practice Providers or APPs (Physician Assistants and Nurse Practitioners) who all work together to provide you with the care you need, when you need it.  Your next appointment:   6 months  Provider:   Deatrice Cage, MD

## 2024-12-03 NOTE — Progress Notes (Unsigned)
 "  Subjective:  follow-up for chronic osteomyelitis  calcaneous, tibia, non healing wound, recurrent cellulitis    [name], female    DOB: August 01, 1941, 84 y.o.   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   Depression    GERD (gastroesophageal reflux disease)    takes Omeprazole daily   Heart murmur    mild MS, moderate-severe AS 03/17/21 echo   History of bronchitis    20+ yrs ago   History of kidney stones    3 surgerical removed, 1 passed   History of prolapse of bladder    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 Albuterol  inhaler as needed   Spinal headache 1991   blood patch placed   Spondylitis    Unsteady gait    occasionally   Urinary urgency     Past Surgical History:  Procedure Laterality Date   ABDOMINAL AORTOGRAM W/LOWER EXTREMITY N/A 02/15/2022   Procedure: ABDOMINAL AORTOGRAM W/LOWER EXTREMITY;  Surgeon: Sheree Penne Bruckner, MD;  Location: [facility] INVASIVE CV LAB;  Service: Cardiovascular;  Laterality: N/A;   ABDOMINAL AORTOGRAM W/LOWER EXTREMITY N/A 09/12/2023   Procedure: ABDOMINAL AORTOGRAM W/LOWER  EXTREMITY;  Surgeon: Lanis Fonda BRAVO, MD;  Location: [facility] INVASIVE CV LAB;  Service: Cardiovascular;  Laterality: N/A;   ABDOMINAL HYSTERECTOMY     ANTERIOR FUSION CERVICAL SPINE     x2 -C4-7   APPLICATION OF WOUND VAC Right 03/12/2022   Procedure: APPLICATION OF WOUND VAC;  Surgeon: Harden Jerona GAILS, MD;  Location: [facility] OR;  Service: Orthopedics;  Laterality: Right;   BUNIONECTOMY Bilateral    COLONOSCOPY     CYSTOSCOPY W/ URETEROSCOPY  2012   EYE SURGERY Bilateral    cataract /lens implant   HOLMIUM LASER APPLICATION Left 02/08/2013   Procedure: HOLMIUM LASER APPLICATION;  Surgeon: Norleen JINNY Seltzer, MD;  Location: [facility];  Service: Urology;  Laterality: Left;   I & D EXTREMITY Right 02/10/2022   Procedure: DEBRIDEMENT RIGHT LEG ABSCESS;  Surgeon: Harden Jerona GAILS, MD;  Location: [facility] OR;  Service: Orthopedics;  Laterality: Right;   I & D EXTREMITY Right 03/12/2022   Procedure: RIGHT LEG IRRIGATION AND DEBRIDEMENT EXTREMITY;  Surgeon: Harden Jerona GAILS, MD;  Location: [facility] OR;  Service: Orthopedics;  Laterality: Right;   INSERTION OF MESH N/A 07/15/2014   Procedure: INSERTION OF MESH;  Surgeon: Elspeth KYM Schultze, MD;  Location: [facility] OR;  Service: General;  Laterality: N/A;  JOINT REPLACEMENT Right 2012   shoulder   LAPAROSCOPIC CHOLECYSTECTOMY W/ CHOLANGIOGRAPHY  2012   Dr Vernetta   NASAL SINUS SURGERY     OPEN REDUCTION INTERNAL FIXATION (ORIF) DISTAL RADIAL FRACTURE Left 03/20/2021   Procedure: OPEN REDUCTION INTERNAL FIXATION (ORIF) DISTAL RADIAL FRACTURE;  Surgeon: Josefina Chew, MD;  Location: [facility] OR;  Service: Orthopedics;  Laterality: Left;   RADIOLOGY WITH ANESTHESIA N/A 05/09/2014   Procedure: ADULT SEDATION WITH ANESTHESIA/MRI CERVICAL SPINE WITHOUT CONTRAST;  Surgeon: Medication Radiologist, MD;  Location: [facility] OR;  Service: Radiology;  Laterality: N/A;  DR. HAWKS/MRI   right knee arthroscopy     d/t meniscal tear   SHOULDER ARTHROSCOPY W/ ROTATOR CUFF REPAIR Bilateral three times  each over several yrs   SKIN FULL THICKNESS GRAFT Right 03/12/2022   Procedure: SKIN GRAFT FULL THICKNESS;  Surgeon: Harden Jerona GAILS, MD;  Location: [facility] OR;  Service: Orthopedics;  Laterality: Right;   SKIN SPLIT GRAFT Right 02/17/2022   Procedure: RIGHT LEG SKIN GRAFT;  Surgeon: Harden Jerona GAILS, MD;  Location: [facility] OR;  Service: Orthopedics;  Laterality: Right;   THUMB ARTHROSCOPY Left    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   Procedure: RIGHT TOTAL KNEE ARTHROPLASTY;  Surgeon: Norleen Gavel, MD;  [facility] OR;  Service: Orthopedics;  Laterality: Right;   UMBILICAL HERNIA REPAIR N/A 07/15/2014   Procedure: LAPAROSCOPIC UMBILICAL AND INFRAUMBILICAL HERNIA;  Surgeon: Elspeth KYM Schultze, MD;  [facility]: [facility] OR;  Service: General;  Laterality: N/A;    Family History  Problem Relation Age of Onset   Stroke Father       Social History   Socioeconomic History   Marital status: Divorced    Spouse name: Not on file   Number of children: Not on file   Years of education: Not on file   Highest education level: Not on file  Occupational History   Not on file  Tobacco Use   Smoking status: Former    Current packs/day: 0.00    Average packs/day: 0.5 packs/day for 2.0 years (1.0 ttl pk-yrs)    Types: Cigarettes    Start date: 02/06/1990    Quit date: 02/07/1992    Years since quitting: 32.8   Smokeless tobacco: Never  Vaping Use   Vaping status: Never Used  Substance and Sexual Activity   Alcohol  use: No   Drug use: No   Sexual activity: Not on file  Other Topics Concern   Not on file  Social History Narrative   Not on file   Social Drivers of Health   Tobacco Use: Medium Risk (11/28/2024)   Patient History    Smoking Tobacco Use: Former    Smokeless Tobacco Use: Never    Passive Exposure: Not on file  Financial Resource Strain: Low Risk (05/25/2024)   Received from [facility]   Overall Financial Resource Strain (CARDIA)    Difficulty of Paying Living Expenses: Not hard at all   Food Insecurity: No Food Insecurity (10/19/2024)   Epic    Worried About Radiation Protection Practitioner of Food in the Last Year: Never true    Ran Out of Food in the Last Year: Never true  Transportation Needs: No Transportation Needs (10/19/2024)   Epic    Lack of Transportation (Medical): No    Lack of Transportation (Non-Medical): No  Physical Activity: Unknown (05/25/2024)   Received from [facility]   Exercise Vital Sign    On average, how many days per week do you engage in moderate to  strenuous exercise (like a brisk walk)?: 0 days    Minutes of Exercise per Session: Not on file  Stress: No Stress Concern Present (05/25/2024)   Received from [facility] - Occupational Stress Questionnaire    Feeling of Stress : Only a little  Social Connections: Patient Declined (10/19/2024)   Social Connection and Isolation Panel    Frequency of Communication with Friends and Family: Patient declined    Frequency of Social Gatherings with Friends and Family: Patient declined    Attends Religious Services: Patient declined    Database Administrator or Organizations: Patient declined    Attends Banker Meetings: Patient declined    Marital Status: Patient declined  Depression (PHQ2-9): Low Risk (12/28/2023)   Depression (PHQ2-9)    PHQ-2 Score: 1  Alcohol  Screen: Not on file  Housing: Low Risk (10/19/2024)   Epic    Unable to Pay for Housing in the Last Year: No    Number of Times Moved in the Last Year: 0    Homeless in the Last Year: No  Utilities: Not At Risk (10/19/2024)   Epic    Threatened with loss of utilities: No  Health Literacy: Not on file    Allergies[1]  Current Medications[2]   Review of Systems     Objective:   Physical Exam        Assessment & Plan:       [1]  Allergies Allergen Reactions   Dilaudid  [Hydromorphone  Hcl] Shortness Of Breath   Gabapentin Other (See Comments)    Hoarseness , headache and sore  throat   Latex Rash    Severe rash   Lyrica [Pregabalin] Other (See Comments)    No balance , had to walk with cane , Blurred vision,weakness.   Oxycodone  Shortness Of Breath and Other (See Comments)    Tolerated oxycodone  during admission to hospital 02/06/22   Singulair [Montelukast] Shortness Of Breath and Other (See Comments)    Vision issues, also   Ciprofloxacin Hcl Nausea And Vomiting and Other (See Comments)    Nausea and vomiting with by mouth form   Codeine Other (See Comments)    Hallucinations   Methadone Nausea And Vomiting and Other (See Comments)    Severe nausea and vomiting   Metronidazole  Nausea And Vomiting and Other (See Comments)    Gastric pain   Oysters [Shellfish Allergy] Other (See Comments)    Terrible gastric upset and cramping.   Clindamycin /Lincomycin Diarrhea and Nausea Only   Donepezil Diarrhea and Other (See Comments)    Severe diarrhea   Ferrous Sulfate      Severe gi upset, will get an infusion when needed per caregiver   Sulfa Antibiotics Diarrhea and Other (See Comments)    GI issues    Tape Other (See Comments)    ADHESIVE TAPE-Severe rash   Zetia [Ezetimibe] Diarrhea   Elemental Sulfur Nausea And Vomiting   Iodine  Rash    Duplicate entry   Other Rash    All Antibiotic ointments/ creams   Oyster Shell Rash   Povidone Iodine  Rash and Other (See Comments)    Oyster shell products- Rash    Povidone-Iodine      Duplicate entry   Skintegrity Hydrogel [Skin Protectants, Misc.] Rash   Tapentadol Other (See Comments)    Nightmares **Nucynta**  [2]  Current Outpatient Medications:    amoxicillin -clavulanate (AUGMENTIN ) 875-125 MG tablet, Take 1 tablet by mouth 2 (two) times daily., Disp: 20  tablet, Rfl: 4   aspirin  EC 81 MG tablet, Take 1 tablet (81 mg total) by mouth daily. Swallow whole. (Patient not taking: Reported on 11/28/2024), Disp: 30 tablet, Rfl: 12   Calcium -Vitamin D -Vitamin K ([facility] CALCIUM  CHEWS PO), Take 1 tablet by mouth  daily., Disp: , Rfl:    carvedilol  (COREG ) 3.125 MG tablet, Take 1 tablet (3.125 mg total) by mouth 2 (two) times daily with a meal., Disp: 60 tablet, Rfl: 0   cholecalciferol  (VITAMIN D ) 1000 UNITS tablet, Take 1,000 Units by mouth daily., Disp: , Rfl:    diclofenac  sodium (VOLTAREN ) 1 % GEL, Apply 2 g topically daily as needed (for knee pain)., Disp: , Rfl:    diphenhydrAMINE  (BENADRYL ) 25 MG tablet, Take 25 mg by mouth at bedtime as needed for allergies or sleep., Disp: , Rfl:    HYDROcodone -acetaminophen  (NORCO) 10-325 MG tablet, Take 1 tablet by mouth every 6 (six) hours. 0400, 1000, 1600, 2200, Disp: , Rfl:    hydrOXYzine  (ATARAX ) 25 MG tablet, Take 25 mg by mouth 3 (three) times daily as needed for anxiety., Disp: , Rfl:    Lactobacillus (PROBIOTIC ACIDOPHILUS PO), Take 1 capsule by mouth daily., Disp: , Rfl:    levothyroxine  (SYNTHROID ) 75 MCG tablet, Take 75 mcg by mouth daily., Disp: , Rfl:    linezolid  (ZYVOX ) 600 MG tablet, Take 1 tablet (600 mg total) by mouth 2 (two) times daily. To be saved for severe infection (Patient not taking: Reported on 11/28/2024), Disp: 20 tablet, Rfl: 1   loperamide  (IMODIUM  A-D) 2 MG tablet, Take 4 mg by mouth 3 (three) times daily as needed for diarrhea or loose stools., Disp: , Rfl:    magic mouthwash (lidocaine , diphenhydrAMINE , alum & mag hydroxide) suspension, Swish and spit 5 mLs 3 (three) times daily as needed for mouth pain. (Patient not taking: Reported on 11/28/2024), Disp: 360 mL, Rfl: 0   Melatonin 5 MG CHEW, Chew 10 mg by mouth at bedtime., Disp: , Rfl:    methocarbamol  (ROBAXIN ) 500 MG tablet, Take 500 mg by mouth daily as needed for muscle spasms., Disp: , Rfl:    Multiple Vitamins-Minerals (MULTIVITAMIN PO), Take 1 tablet by mouth daily., Disp: , Rfl:    ondansetron  (ZOFRAN ) 4 MG tablet, Take 1 tablet (4 mg total) by mouth 2 (two) times daily. Take before each dose of augmentin  and can take again if needed up to 3 doses per day, Disp: 90  tablet, Rfl: 3   RESTASIS  0.05 % ophthalmic emulsion, Place 1 drop into both eyes daily., Disp: 0.4 mL, Rfl: 0   rosuvastatin  (CRESTOR ) 10 MG tablet, Take 1 tablet (10 mg total) by mouth daily., Disp: 30 tablet, Rfl: 0   Wound Cleansers (VASHE WOUND THERAPY EX), Apply 1 application  topically daily. Given samples by provider, Disp: , Rfl:   "

## 2024-12-04 ENCOUNTER — Ambulatory Visit: Payer: Self-pay | Admitting: Infectious Disease

## 2024-12-05 ENCOUNTER — Other Ambulatory Visit: Payer: Self-pay

## 2024-12-05 ENCOUNTER — Ambulatory Visit: Admitting: Infectious Disease

## 2024-12-05 ENCOUNTER — Encounter: Payer: Self-pay | Admitting: Infectious Disease

## 2024-12-05 VITALS — BP 135/55 | HR 65 | Temp 98.0°F

## 2024-12-05 DIAGNOSIS — L4050 Arthropathic psoriasis, unspecified: Secondary | ICD-10-CM

## 2024-12-05 DIAGNOSIS — M86171 Other acute osteomyelitis, right ankle and foot: Secondary | ICD-10-CM

## 2024-12-05 DIAGNOSIS — I83019 Varicose veins of right lower extremity with ulcer of unspecified site: Secondary | ICD-10-CM

## 2024-12-05 DIAGNOSIS — I739 Peripheral vascular disease, unspecified: Secondary | ICD-10-CM

## 2024-12-05 DIAGNOSIS — M86361 Chronic multifocal osteomyelitis, right tibia and fibula: Secondary | ICD-10-CM | POA: Diagnosis not present

## 2024-12-05 DIAGNOSIS — M069 Rheumatoid arthritis, unspecified: Secondary | ICD-10-CM

## 2024-12-05 DIAGNOSIS — L97919 Non-pressure chronic ulcer of unspecified part of right lower leg with unspecified severity: Secondary | ICD-10-CM

## 2024-12-05 DIAGNOSIS — S81801D Unspecified open wound, right lower leg, subsequent encounter: Secondary | ICD-10-CM

## 2024-12-05 DIAGNOSIS — A419 Sepsis, unspecified organism: Secondary | ICD-10-CM

## 2024-12-13 ENCOUNTER — Ambulatory Visit: Admitting: Physician Assistant

## 2024-12-14 ENCOUNTER — Telehealth: Payer: Self-pay | Admitting: Orthopedic Surgery

## 2024-12-14 ENCOUNTER — Telehealth: Payer: Self-pay | Admitting: Family

## 2024-12-14 NOTE — Telephone Encounter (Signed)
 Isabel Barnes pt caregiver called asking for steroids for pt swollen legs. Caregiver cancelled appt due to transportation issue. Please send to [facility] [address]. Pt's caregiver Isabel Barnes  number is [phone]

## 2024-12-14 NOTE — Telephone Encounter (Signed)
 noted

## 2024-12-14 NOTE — Telephone Encounter (Signed)
 We do not prescribe steroids to manage swelling. Pt got her PCP to Rx a medication from another message.

## 2024-12-14 NOTE — Telephone Encounter (Signed)
 Patient's caregiver called. Something has been called in by her PCP. Disregard the 1st message.

## 2024-12-20 ENCOUNTER — Ambulatory Visit: Admitting: Physician Assistant

## 2024-12-20 ENCOUNTER — Encounter: Payer: Self-pay | Admitting: Physician Assistant

## 2024-12-20 DIAGNOSIS — L97912 Non-pressure chronic ulcer of unspecified part of right lower leg with fat layer exposed: Secondary | ICD-10-CM | POA: Diagnosis not present

## 2024-12-20 DIAGNOSIS — I872 Venous insufficiency (chronic) (peripheral): Secondary | ICD-10-CM

## 2024-12-20 DIAGNOSIS — M79671 Pain in right foot: Secondary | ICD-10-CM | POA: Diagnosis not present

## 2024-12-20 DIAGNOSIS — G8929 Other chronic pain: Secondary | ICD-10-CM | POA: Diagnosis not present

## 2024-12-20 DIAGNOSIS — I890 Lymphedema, not elsewhere classified: Secondary | ICD-10-CM | POA: Diagnosis not present

## 2024-12-20 NOTE — Progress Notes (Signed)
 "  Office Visit Note   Patient: Isabel Barnes           Date of Birth: 1941/02/21           MRN: [id number] Visit Date: 12/20/2024              Requested by: Hartwell Area, PA-C [address] [place],  [place] [address] PCP: Hartwell Area, PA-C  Chief Complaint  Patient presents with   Right Leg - Follow-up   Left Leg - Follow-up      HPI: 84 y/o female with lymphedema and venous insuffiencey Bilateral LE.  She has a chronic medial right lower extremity wound.  She has chronic right heel pain that she protects with a heel pad for ambulation.  There is no open wound on the plantar heel there is dry skin.  Since her last visit her care giver has been washing the lower extremities with soap and water  then wiping the skin and wound down with Vashe daily.  She has now developed multiple small blisters on the lateral right leg.    No open wounds on the left LE.  She does use lymphedema pumps occasionally and some elevation during the day in the supine position.  She does tend to sit with her legs in a dependent position most days.     Assessment & Plan: Visit Diagnoses:  1. Lymphedema   2. Chronic heel pain, right   3. Venous insufficiency (chronic) (peripheral)   4. Chronic ulcer of right leg, with fat layer exposed (HCC)     Plan:  Chronic B LE lymphedema with venous insufficiency Chronic medial right LE venous wound No sign of infection I recommended increased elevation of B LE, lymphedema pumps daily.    Right LE Vashe wet to dry on the wound with guaze and ABD pads covered with ace wrap compression.  They will stop using the Vashe on the leg skin and just use soap and water .   Heel Pad as needed for heel pressure pain.  F/U in 4 weeks.     Follow-Up Instructions: Return in about 4 weeks (around 01/17/2025).   Ortho Exam  Patient is alert, oriented, no adenopathy, well-dressed, normal affect, normal respiratory effort. Right LE with chronic venous  wound measuring 11.5 cm x 7 cm.  No cellulitis or purulent drainage.  There are small blisters on the lateral leg with a flat 2 cm wound.  Weeping from B LE while sitting in the office.  swollen, hard, and thickened (fibrotic), developing a rough, pebbly texture Bilaterally right > left.  No open wounds on the left LE.      Imaging:            Labs: Lab Results  Component Value Date   ESRSEDRATE 9 11/12/2024   ESRSEDRATE 6 01/16/2024   ESRSEDRATE 6 12/19/2023   CRP 69.2 (H) 11/12/2024   CRP 4.3 (H) 10/21/2024   CRP 10.3 (H) 10/20/2024   REPTSTATUS 10/22/2024 FINAL 10/17/2024   REPTSTATUS 10/22/2024 FINAL 10/17/2024   GRAMSTAIN  02/10/2022    RARE WBC PRESENT, PREDOMINANTLY PMN NO ORGANISMS SEEN    CULT  10/17/2024    NO GROWTH 5 DAYS Performed at [facility], [address] [place], [place] [address]    CULT  10/17/2024    NO GROWTH 5 DAYS Performed at [facility], [address], [place], [place] [address]    [facility] ENTEROCOCCUS MARIJO 02/10/2022  Lab Results  Component Value Date   ALBUMIN  2.3 (L) 10/22/2024   ALBUMIN  2.3 (L) 10/21/2024   ALBUMIN  2.5 (L) 10/20/2024    Lab Results  Component Value Date   MG 2.3 10/21/2024   MG 2.4 10/20/2024   MG 2.5 (H) 10/19/2024   Lab Results  Component Value Date   VD25OH 69.88 02/07/2022    No results found for: PREALBUMIN    Latest Ref Rng & Units 11/12/2024   11:31 AM 10/22/2024    3:42 AM 10/21/2024    3:50 AM  CBC EXTENDED  WBC 3.8 - 10.8 Thousand/uL 6.5  7.7  10.3   RBC 3.80 - 5.10 Million/uL 3.65  3.57  3.45   Hemoglobin 11.7 - 15.5 g/dL 89.8  9.9  9.7   HCT 64.0 - 46.0 % 30.9  31.2  29.9   Platelets 140 - 400 Thousand/uL 364  254  254   NEUT# 1,500 - 7,800 cells/uL 3,562  6.1  8.7   Lymph# 0.7 - 4.0 K/uL  0.9  0.8      There is no height or weight on file to calculate BMI.  Orders:  No orders of the defined types were placed in this encounter.  No orders of the  defined types were placed in this encounter.    Procedures: No procedures performed  Clinical Data: No additional findings.  ROS:  All other systems negative, except as noted in the HPI. Review of Systems  Objective: Vital Signs: There were no vitals taken for this visit.  Specialty Comments:  No specialty comments available.  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   RLE Wound  R Heel Ulcer 09/08/2023   LLE Cellulitis 09/07/2023   Abscess of right lower leg    Severe protein-calorie malnutrition    Peripheral arterial disease    Fall    Atrial fibrillation (HCC)    Goals of care, counseling/discussion    Anemia    Fluid collection (edema) in the arms, legs, hands and feet    Cellulitis of right lower extremity 02/06/2022   Chest pain, rule out acute myocardial infarction 03/16/2021   Hypertension 03/16/2021   Hypothyroidism 03/16/2021   Hyperlipemia 03/16/2021   Aortic valve stenosis 03/16/2021   Chronic venous insufficiency of lower extremity 03/16/2021   Short-term memory loss 12/13/2019   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   Primary osteoarthritis of right knee 07/16/2016   Eosinophilia 06/16/2015   Chronic pain disorder    Osteoarthritis    Incisional umbilical hernia, without obstruction or gangrene    Iron deficiency anemia 09/19/2012   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   AKI (acute kidney injury) 09/08/2023   Anemia    iron deficiency hx.has had iron infusions before    Chronic low back pain    Chronic  pain disorder    Complication of anesthesia    severe claustrophobia   Constipation    r/t use of pain meds.Takes OTC meds or eats prunes   CVA (cerebral vascular accident) (HCC)    remote right cerebellar infarct  noted on 02/06/22 head CT   Depression    GERD (gastroesophageal reflux disease)    takes Omeprazole daily   Heart murmur    mild MS, moderate-severe AS 03/17/21 echo   History of bronchitis    20+ yrs ago   History of kidney stones    3 surgerical removed, 1 passed   History of prolapse of bladder    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 Albuterol  inhaler as needed   Spinal headache 1991   blood patch placed   Spondylitis    Unsteady gait    occasionally   Urinary urgency     Family History  Problem Relation Age of Onset   Stroke Father     Past Surgical History:  Procedure Laterality Date   ABDOMINAL AORTOGRAM W/LOWER EXTREMITY N/A 02/15/2022   Procedure: ABDOMINAL AORTOGRAM W/LOWER EXTREMITY;  Surgeon: Sheree Penne Bruckner, MD;  Location: [facility] INVASIVE CV LAB;  Service: Cardiovascular;  Laterality: N/A;   ABDOMINAL AORTOGRAM W/LOWER EXTREMITY N/A 09/12/2023   Procedure: ABDOMINAL AORTOGRAM W/LOWER EXTREMITY;  Surgeon: Lanis Fonda BRAVO, MD;  Location: [facility] INVASIVE CV LAB;  Service: Cardiovascular;  Laterality: N/A;   ABDOMINAL HYSTERECTOMY     ANTERIOR FUSION CERVICAL SPINE     x2 -C4-7   APPLICATION OF WOUND VAC Right 03/12/2022   Procedure: APPLICATION OF WOUND VAC;  Surgeon: Harden Jerona GAILS, MD;  [facility];  Service: Orthopedics;  Laterality: Right;   BUNIONECTOMY  Bilateral    COLONOSCOPY     CYSTOSCOPY W/ URETEROSCOPY  2012   EYE SURGERY Bilateral    cataract /lens implant   HOLMIUM LASER APPLICATION Left 02/08/2013   Procedure: HOLMIUM LASER APPLICATION;  Surgeon: Norleen JINNY Seltzer, MD;  Location: [facility];  Service: Urology;  Laterality: Left;   I & D EXTREMITY Right 02/10/2022   Procedure: DEBRIDEMENT RIGHT LEG ABSCESS;  Surgeon: Harden Jerona GAILS, MD;  Location: [facility] OR;  Service: Orthopedics;  Laterality: Right;   I & D EXTREMITY Right 03/12/2022   Procedure: RIGHT LEG IRRIGATION AND DEBRIDEMENT EXTREMITY;  Surgeon: Harden Jerona GAILS, MD;  Location: [facility] OR;  Service: Orthopedics;  Laterality: Right;   INSERTION OF MESH N/A 07/15/2014   Procedure: INSERTION OF MESH;  Surgeon: Elspeth KYM Schultze, MD;  [facility];  Service: General;  Laterality: N/A;   JOINT REPLACEMENT Right 2012   shoulder   LAPAROSCOPIC CHOLECYSTECTOMY W/ CHOLANGIOGRAPHY  2012   Dr Vernetta   NASAL SINUS SURGERY     OPEN REDUCTION INTERNAL FIXATION (ORIF) DISTAL RADIAL FRACTURE Left 03/20/2021   Procedure: OPEN REDUCTION INTERNAL FIXATION (ORIF) DISTAL RADIAL FRACTURE;  Surgeon: Josefina Fonda, MD;  [facility];  Service: Orthopedics;  Laterality: Left;   RADIOLOGY WITH ANESTHESIA N/A 05/09/2014   Procedure: ADULT SEDATION WITH ANESTHESIA/MRI CERVICAL SPINE WITHOUT CONTRAST;  Surgeon: Medication Radiologist, MD;  [facility];  Service: Radiology;  Laterality: N/A;  DR. HAWKS/MRI   right knee arthroscopy     d/t meniscal  tear   SHOULDER ARTHROSCOPY W/ ROTATOR CUFF REPAIR Bilateral three times each over several yrs   SKIN FULL THICKNESS GRAFT Right 03/12/2022   Procedure: SKIN GRAFT FULL THICKNESS;  Surgeon: Harden Jerona GAILS, MD;  Location: [facility] OR;  Service: Orthopedics;  Laterality: Right;   SKIN SPLIT GRAFT Right 02/17/2022   Procedure: RIGHT LEG SKIN GRAFT;  Surgeon: Harden Jerona GAILS, MD;  Location: [facility] OR;  Service: Orthopedics;  Laterality: Right;   THUMB ARTHROSCOPY Left     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   Procedure: RIGHT TOTAL KNEE ARTHROPLASTY;  Surgeon: Norleen Gavel, MD;  [facility];  Service: Orthopedics;  Laterality: Right;   UMBILICAL HERNIA REPAIR N/A 07/15/2014   Procedure: LAPAROSCOPIC UMBILICAL AND INFRAUMBILICAL HERNIA;  Surgeon: Elspeth KYM Schultze, MD;  Location: [facility] OR;  Service: General;  Laterality: N/A;   Social History   Occupational History   Not on file  Tobacco Use   Smoking status: Former    Current packs/day: 0.00    Average packs/day: 0.5 packs/day for 2.0 years (1.0 ttl pk-yrs)    Types: Cigarettes    Start date: 02/06/1990    Quit date: 02/07/1992    Years since quitting: 32.8   Smokeless tobacco: Never  Vaping Use   Vaping status: Never Used  Substance and Sexual Activity   Alcohol  use: No   Drug use: No   Sexual activity: Not on file       "

## 2024-12-31 ENCOUNTER — Encounter: Payer: Self-pay | Admitting: Infectious Disease

## 2025-01-01 NOTE — Telephone Encounter (Signed)
 Called and spoke with Burnard, she states patient did not complain of a fever or chills. She has not seen her today, but is planning on calling her this AM to check on her and will pick up her doxycycline  this afternoon.   Hosie Sharman, BSN, RN

## 2025-01-04 ENCOUNTER — Telehealth: Payer: Self-pay

## 2025-01-04 NOTE — Telephone Encounter (Signed)
 Reena Roys from [facility] called stating the Vashe is considered wet to dry dressing and they say that does not warrant skilled nursing Reena Roys [phone] [facility]

## 2025-01-04 NOTE — Telephone Encounter (Signed)
 LM for Reena on number provided below. Let her know I got her message, stated that I wasn't sure if this was just an FYI to let us  know OR if she has any other ideas of what to do for treatment that would be considered skilled nursing need to let us  know.

## 2025-01-17 ENCOUNTER — Ambulatory Visit: Admitting: Physician Assistant

## 2025-02-04 ENCOUNTER — Ambulatory Visit: Payer: Self-pay | Admitting: Infectious Disease
